# Patient Record
Sex: Male | Born: 1975
Health system: Southern US, Community
[De-identification: ages and names within clinical notes are randomized; demographics above are authoritative.]

## PROBLEM LIST (undated history)

## (undated) DIAGNOSIS — I130 Hypertensive heart and chronic kidney disease with heart failure and stage 1 through stage 4 chronic kidney disease, or unspecified chronic kidney disease: Secondary | ICD-10-CM

## (undated) DIAGNOSIS — D649 Anemia, unspecified: Secondary | ICD-10-CM

## (undated) DIAGNOSIS — E669 Obesity, unspecified: Secondary | ICD-10-CM

## (undated) DIAGNOSIS — I429 Cardiomyopathy, unspecified: Secondary | ICD-10-CM

## (undated) DIAGNOSIS — I110 Hypertensive heart disease with heart failure: Secondary | ICD-10-CM

## (undated) DIAGNOSIS — N186 End stage renal disease: Secondary | ICD-10-CM

## (undated) DIAGNOSIS — I5041 Acute combined systolic (congestive) and diastolic (congestive) heart failure: Secondary | ICD-10-CM

## (undated) DIAGNOSIS — Z992 Dependence on renal dialysis: Secondary | ICD-10-CM

## (undated) DIAGNOSIS — I10 Essential (primary) hypertension: Secondary | ICD-10-CM

## (undated) DIAGNOSIS — R0600 Dyspnea, unspecified: Secondary | ICD-10-CM

## (undated) DIAGNOSIS — N189 Chronic kidney disease, unspecified: Secondary | ICD-10-CM

## (undated) DIAGNOSIS — I509 Heart failure, unspecified: Secondary | ICD-10-CM

## (undated) HISTORY — PX: NO PAST SURGERIES: SHX2092

## (undated) HISTORY — PX: COLONOSCOPY: SHX174

## (undated) HISTORY — DX: Hypertensive heart disease with heart failure: I11.0

---

## 2013-09-20 ENCOUNTER — Emergency Department (HOSPITAL_COMMUNITY)
Admission: EM | Admit: 2013-09-20 | Discharge: 2013-09-21 | Disposition: A | Discharge status: Home / Self Care | Payer: Worker's Compensation | Attending: Emergency Medicine | Admitting: Emergency Medicine

## 2013-09-20 ENCOUNTER — Emergency Department (HOSPITAL_COMMUNITY): Payer: Worker's Compensation

## 2013-09-20 ENCOUNTER — Encounter (HOSPITAL_COMMUNITY): Payer: Self-pay | Admitting: Emergency Medicine

## 2013-09-20 DIAGNOSIS — S61209A Unspecified open wound of unspecified finger without damage to nail, initial encounter: Secondary | ICD-10-CM | POA: Insufficient documentation

## 2013-09-20 DIAGNOSIS — Z23 Encounter for immunization: Secondary | ICD-10-CM | POA: Insufficient documentation

## 2013-09-20 DIAGNOSIS — Z79899 Other long term (current) drug therapy: Secondary | ICD-10-CM | POA: Insufficient documentation

## 2013-09-20 DIAGNOSIS — F17200 Nicotine dependence, unspecified, uncomplicated: Secondary | ICD-10-CM | POA: Insufficient documentation

## 2013-09-20 DIAGNOSIS — S61012A Laceration without foreign body of left thumb without damage to nail, initial encounter: Secondary | ICD-10-CM

## 2013-09-20 DIAGNOSIS — Y9389 Activity, other specified: Secondary | ICD-10-CM | POA: Insufficient documentation

## 2013-09-20 DIAGNOSIS — W268XXA Contact with other sharp object(s), not elsewhere classified, initial encounter: Secondary | ICD-10-CM | POA: Insufficient documentation

## 2013-09-20 DIAGNOSIS — Y929 Unspecified place or not applicable: Secondary | ICD-10-CM | POA: Insufficient documentation

## 2013-09-20 MED ORDER — TETANUS-DIPHTH-ACELL PERTUSSIS 5-2.5-18.5 LF-MCG/0.5 IM SUSP
0.5000 mL | Freq: Once | INTRAMUSCULAR | Status: AC
  Administered 2013-09-20: 0.5 mL via INTRAMUSCULAR
  Filled 2013-09-20: qty 0.5

## 2013-09-20 MED ORDER — HYDROCODONE-ACETAMINOPHEN 5-325 MG PO TABS
1.0000 | ORAL_TABLET | Freq: Once | ORAL | Status: AC
  Administered 2013-09-20: 1 via ORAL
  Filled 2013-09-20: qty 1

## 2013-09-20 NOTE — ED Notes (Signed)
PA at bedside.

## 2013-09-20 NOTE — ED Notes (Signed)
Pt states he was at work and was washing dishes and a glass broke cutting his left thumb  Bleeding controlled

## 2013-09-20 NOTE — ED Provider Notes (Signed)
CSN: [id number]     Arrival date & time 09/20/13  2123 History  This chart was scribed for non-physician practitioner, Dewaine Oats, PA-C, working with Arbie Cookey, *, by Delphia Grates, ED Scribe. This patient was seen in room WTR5/WTR5 and the patient's care was started at 11:15 PM.    Chief Complaint  Patient presents with  . Extremity Laceration     The history is provided by the patient. No language interpreter was used.    HPI Comments: Nathan Campbell is a 38 y.o. male who presents to the Emergency Department complaining of laceration to left thumb that occurred PTA. Patient states he was at work washing dishes when a glass shattered, cutting his left thumb. There is associated mild pain with controlled bleeding. Patient has no history of significant health conditions. Patient is not UTD on tetanus.  History reviewed. No pertinent past medical history. History reviewed. No pertinent past surgical history. Family History  Problem Relation Age of Onset  . Diabetes Father    History  Substance Use Topics  . Smoking status: Current Some Day Smoker  . Smokeless tobacco: Not on file  . Alcohol Use: Yes     Comment: socially    Review of Systems  Skin: Positive for wound (left thumb).  All other systems reviewed and are negative.     Allergies  Review of patient's allergies indicates no known allergies.  Home Medications   Prior to Admission medications   Medication Sig Start Date End Date Taking? Authorizing Provider  acetaminophen (TYLENOL) 325 MG tablet Take 650 mg by mouth every 6 (six) hours as needed (pain).   Yes Historical Provider, MD   Triage Vitals: BP 166/111  Pulse 79  Temp(Src) 98.4 F (36.9 C) (Oral)  Resp 20  SpO2 98%  Physical Exam  Nursing note and vitals reviewed. Constitutional: He is oriented to person, place, and time. He appears well-developed and well-nourished. No distress.  HENT:  Head: Normocephalic and atraumatic.   Eyes: Conjunctivae and EOM are normal.  Neck: Neck supple.  Cardiovascular: Normal rate.   Pulmonary/Chest: Effort normal.  Musculoskeletal: Normal range of motion.  Neurological: He is alert and oriented to person, place, and time.  Skin: Skin is warm and dry.  Left thumb: 1 cm laceration over dorsal 1st MCP joint. FROM of the hand and all digits.  Psychiatric: He has a normal mood and affect. His behavior is normal.    ED Course  Procedures (including critical care time)  DIAGNOSTIC STUDIES: Oxygen Saturation is 98% on room air, normal by my interpretation.    COORDINATION OF CARE: At 2317 Discussed treatment plan with patient which includes imaging and laceration repair. Patient agrees.   Labs Review Labs Reviewed - No data to display  Imaging Review No results found.   EKG Interpretation None     Dg Finger Thumb Left  09/21/2013   CLINICAL DATA:  Thumb laceration.  EXAM: LEFT THUMB 2+V  COMPARISON:  None.  FINDINGS: There is no evidence of fracture or dislocation. There is no evidence of arthropathy or other focal bone abnormality. Soft tissues are unremarkable  IMPRESSION: Negative.   Electronically Signed   By: Elon Alas   On: 09/21/2013 00:15   MDM   Final diagnoses:  None    1. Laceration left thumb LACERATION REPAIR Performed by: Charlann Lange A Authorized by: Charlann Lange A Consent: Verbal consent obtained. Risks and benefits: risks, benefits and alternatives were discussed Consent given by:  patient Patient identity confirmed: provided demographic data Prepped and Draped in normal sterile fashion Wound explored  Laceration Location: left thumb  Laceration Length: 1cm  No Foreign Bodies seen or palpated  Anesthesia: local infiltration  Local anesthetic: lidocaine 2% w/o epinephrine  Anesthetic total: 1 ml  Irrigation method: syringe Amount of cleaning: standard  Skin closure: 4-0 prolene  Number of sutures: 2  Technique:  simple interrupted  Patient tolerance: Patient tolerated the procedure well with no immediate complications.   I personally performed the services described in this documentation, which was scribed in my presence. The recorded information has been reviewed and is accurate.     Dewaine Oats, PA-C 09/21/13 [phone]

## 2013-09-21 MED ORDER — IBUPROFEN 800 MG PO TABS: 800.0000 mg | ORAL_TABLET | Freq: Three times a day (TID) | ORAL | Status: DC

## 2013-09-21 NOTE — Discharge Instructions (Signed)
Sutured Wound Care °Sutures are stitches that can be used to close wounds. Wound care helps prevent pain and infection.  °HOME CARE INSTRUCTIONS  °· Rest and elevate the injured area until all the pain and swelling are gone. °· Only take over-the-counter or prescription medicines for pain, discomfort, or fever as directed by your caregiver. °· After 48 hours, gently wash the area with mild soap and water once a day, or as directed. Rinse off the soap. Pat the area dry with a clean towel. Do not rub the wound. This may cause bleeding. °· Follow your caregiver's instructions for how often to change the bandage (dressing). Stop using a dressing after 2 days or after the wound stops draining. °· If the dressing sticks, moisten it with soapy water and gently remove it. °· Apply ointment on the wound as directed. °· Avoid stretching a sutured wound. °· Drink enough fluids to keep your urine clear or pale yellow. °· Follow up with your caregiver for suture removal as directed. °· Use sunscreen on your wound for the next 3 to 6 months so the scar will not darken. °SEEK IMMEDIATE MEDICAL CARE IF:  °· Your wound becomes red, swollen, hot, or tender. °· You have increasing pain in the wound. °· You have a red streak that extends from the wound. °· There is pus coming from the wound. °· You have a fever. °· You have shaking chills. °· There is a bad smell coming from the wound. °· You have persistent bleeding from the wound. °MAKE SURE YOU:  °· Understand these instructions. °· Will watch your condition. °· Will get help right away if you are not doing well or get worse. °Document Released: 04/08/2004 Document Revised: 05/24/2011 Document Reviewed: 07/05/2010 °Exit[employer]® Patient Information ©2015 [employer]. This information is not intended to replace advice given to you by your health care provider. Make sure you discuss any questions you have with your health care provider. ° °

## 2013-09-21 NOTE — ED Notes (Signed)
PA at bedside.  Pt being sutured

## 2013-09-23 NOTE — ED Provider Notes (Signed)
Medical screening examination/treatment/procedure(s) were performed by non-physician practitioner and as supervising physician I was immediately available for consultation/collaboration.   Arbie Cookey, MD 09/23/13 2044

## 2016-06-10 ENCOUNTER — Ambulatory Visit (HOSPITAL_COMMUNITY)
Admission: EM | Admit: 2016-06-10 | Discharge: 2016-06-10 | Disposition: A | Discharge status: Home / Self Care | Payer: BLUE CROSS/BLUE SHIELD | Source: Home / Self Care

## 2016-06-10 ENCOUNTER — Inpatient Hospital Stay (HOSPITAL_COMMUNITY)
Admission: EM | Admit: 2016-06-10 | Discharge: 2016-06-15 | DRG: 291 | Disposition: A | Discharge status: Home / Self Care | Payer: BLUE CROSS/BLUE SHIELD | Attending: Internal Medicine | Admitting: Internal Medicine

## 2016-06-10 ENCOUNTER — Inpatient Hospital Stay (HOSPITAL_COMMUNITY): Payer: BLUE CROSS/BLUE SHIELD

## 2016-06-10 ENCOUNTER — Encounter (HOSPITAL_COMMUNITY): Payer: Self-pay | Admitting: Family Medicine

## 2016-06-10 ENCOUNTER — Emergency Department (HOSPITAL_COMMUNITY): Payer: BLUE CROSS/BLUE SHIELD

## 2016-06-10 ENCOUNTER — Encounter (HOSPITAL_COMMUNITY): Payer: Self-pay

## 2016-06-10 DIAGNOSIS — N2581 Secondary hyperparathyroidism of renal origin: Secondary | ICD-10-CM | POA: Diagnosis not present

## 2016-06-10 DIAGNOSIS — E669 Obesity, unspecified: Secondary | ICD-10-CM | POA: Diagnosis not present

## 2016-06-10 DIAGNOSIS — N289 Disorder of kidney and ureter, unspecified: Secondary | ICD-10-CM | POA: Diagnosis not present

## 2016-06-10 DIAGNOSIS — Z6837 Body mass index (BMI) 37.0-37.9, adult: Secondary | ICD-10-CM | POA: Diagnosis not present

## 2016-06-10 DIAGNOSIS — R809 Proteinuria, unspecified: Secondary | ICD-10-CM | POA: Diagnosis not present

## 2016-06-10 DIAGNOSIS — I5040 Unspecified combined systolic (congestive) and diastolic (congestive) heart failure: Secondary | ICD-10-CM | POA: Diagnosis present

## 2016-06-10 DIAGNOSIS — I5041 Acute combined systolic (congestive) and diastolic (congestive) heart failure: Secondary | ICD-10-CM

## 2016-06-10 DIAGNOSIS — D649 Anemia, unspecified: Secondary | ICD-10-CM | POA: Diagnosis not present

## 2016-06-10 DIAGNOSIS — K5900 Constipation, unspecified: Secondary | ICD-10-CM | POA: Diagnosis not present

## 2016-06-10 DIAGNOSIS — I132 Hypertensive heart and chronic kidney disease with heart failure and with stage 5 chronic kidney disease, or end stage renal disease: Secondary | ICD-10-CM | POA: Diagnosis not present

## 2016-06-10 DIAGNOSIS — I509 Heart failure, unspecified: Secondary | ICD-10-CM | POA: Diagnosis not present

## 2016-06-10 DIAGNOSIS — R748 Abnormal levels of other serum enzymes: Secondary | ICD-10-CM | POA: Diagnosis not present

## 2016-06-10 DIAGNOSIS — N185 Chronic kidney disease, stage 5: Secondary | ICD-10-CM | POA: Diagnosis not present

## 2016-06-10 DIAGNOSIS — I429 Cardiomyopathy, unspecified: Secondary | ICD-10-CM | POA: Diagnosis not present

## 2016-06-10 DIAGNOSIS — I420 Dilated cardiomyopathy: Secondary | ICD-10-CM | POA: Diagnosis not present

## 2016-06-10 DIAGNOSIS — I428 Other cardiomyopathies: Secondary | ICD-10-CM

## 2016-06-10 DIAGNOSIS — R0602 Shortness of breath: Secondary | ICD-10-CM | POA: Diagnosis not present

## 2016-06-10 DIAGNOSIS — R740 Nonspecific elevation of levels of transaminase and lactic acid dehydrogenase [LDH]: Secondary | ICD-10-CM

## 2016-06-10 DIAGNOSIS — R7989 Other specified abnormal findings of blood chemistry: Secondary | ICD-10-CM | POA: Diagnosis not present

## 2016-06-10 DIAGNOSIS — R778 Other specified abnormalities of plasma proteins: Secondary | ICD-10-CM

## 2016-06-10 DIAGNOSIS — I160 Hypertensive urgency: Secondary | ICD-10-CM | POA: Diagnosis present

## 2016-06-10 DIAGNOSIS — R000 Tachycardia, unspecified: Secondary | ICD-10-CM | POA: Diagnosis not present

## 2016-06-10 DIAGNOSIS — I10 Essential (primary) hypertension: Secondary | ICD-10-CM | POA: Diagnosis not present

## 2016-06-10 DIAGNOSIS — N179 Acute kidney failure, unspecified: Secondary | ICD-10-CM | POA: Diagnosis not present

## 2016-06-10 DIAGNOSIS — Z833 Family history of diabetes mellitus: Secondary | ICD-10-CM | POA: Diagnosis not present

## 2016-06-10 DIAGNOSIS — N184 Chronic kidney disease, stage 4 (severe): Secondary | ICD-10-CM

## 2016-06-10 DIAGNOSIS — F17210 Nicotine dependence, cigarettes, uncomplicated: Secondary | ICD-10-CM | POA: Diagnosis present

## 2016-06-10 DIAGNOSIS — N19 Unspecified kidney failure: Secondary | ICD-10-CM | POA: Diagnosis not present

## 2016-06-10 DIAGNOSIS — I5020 Unspecified systolic (congestive) heart failure: Secondary | ICD-10-CM | POA: Diagnosis not present

## 2016-06-10 DIAGNOSIS — I110 Hypertensive heart disease with heart failure: Secondary | ICD-10-CM | POA: Diagnosis not present

## 2016-06-10 DIAGNOSIS — R0600 Dyspnea, unspecified: Secondary | ICD-10-CM | POA: Diagnosis not present

## 2016-06-10 DIAGNOSIS — K761 Chronic passive congestion of liver: Secondary | ICD-10-CM | POA: Diagnosis not present

## 2016-06-10 DIAGNOSIS — I5031 Acute diastolic (congestive) heart failure: Secondary | ICD-10-CM | POA: Diagnosis not present

## 2016-06-10 DIAGNOSIS — I5021 Acute systolic (congestive) heart failure: Secondary | ICD-10-CM | POA: Diagnosis not present

## 2016-06-10 DIAGNOSIS — R7401 Elevation of levels of liver transaminase levels: Secondary | ICD-10-CM | POA: Diagnosis present

## 2016-06-10 HISTORY — DX: Essential (primary) hypertension: I10

## 2016-06-10 HISTORY — DX: Dyspnea, unspecified: R06.00

## 2016-06-10 HISTORY — DX: Acute combined systolic (congestive) and diastolic (congestive) heart failure: I50.41

## 2016-06-10 HISTORY — DX: Cardiomyopathy, unspecified: I42.9

## 2016-06-10 HISTORY — DX: Heart failure, unspecified: I50.9

## 2016-06-10 HISTORY — DX: Hypertensive heart and chronic kidney disease with heart failure and stage 1 through stage 4 chronic kidney disease, or unspecified chronic kidney disease: I13.0

## 2016-06-10 LAB — URINALYSIS, ROUTINE W REFLEX MICROSCOPIC
Bacteria, UA: NONE SEEN
Bilirubin Urine: NEGATIVE
GLUCOSE, UA: NEGATIVE mg/dL
KETONES UR: NEGATIVE mg/dL
LEUKOCYTES UA: NEGATIVE
Nitrite: NEGATIVE
PROTEIN: 100 mg/dL — AB
Specific Gravity, Urine: 1.006 (ref 1.005–1.030)
pH: 7 (ref 5.0–8.0)

## 2016-06-10 LAB — COMPREHENSIVE METABOLIC PANEL
ALT: 126 U/L — AB (ref 17–63)
AST: 42 U/L — ABNORMAL HIGH (ref 15–41)
Albumin: 3.5 g/dL (ref 3.5–5.0)
Alkaline Phosphatase: 115 U/L (ref 38–126)
Anion gap: 11 (ref 5–15)
BUN: 49 mg/dL — ABNORMAL HIGH (ref 6–20)
CHLORIDE: 103 mmol/L (ref 101–111)
CO2: 24 mmol/L (ref 22–32)
CREATININE: 5.18 mg/dL — AB (ref 0.61–1.24)
Calcium: 8.5 mg/dL — ABNORMAL LOW (ref 8.9–10.3)
GFR calc non Af Amer: 13 mL/min — ABNORMAL LOW (ref >60)
GFR, EST AFRICAN AMERICAN: 15 mL/min — AB (ref >60)
GLUCOSE: 117 mg/dL — AB (ref 65–99)
Potassium: 3.5 mmol/L (ref 3.5–5.1)
SODIUM: 138 mmol/L (ref 135–145)
Total Bilirubin: 1 mg/dL (ref 0.3–1.2)
Total Protein: 5.7 g/dL — ABNORMAL LOW (ref 6.5–8.1)

## 2016-06-10 LAB — RAPID URINE DRUG SCREEN, HOSP PERFORMED
Amphetamines: NOT DETECTED
BENZODIAZEPINES: NOT DETECTED
Barbiturates: NOT DETECTED
Cocaine: NOT DETECTED
OPIATES: NOT DETECTED
Tetrahydrocannabinol: NOT DETECTED

## 2016-06-10 LAB — TROPONIN I: TROPONIN I: 0.23 ng/mL — AB (ref <0.03)

## 2016-06-10 LAB — CBC
HCT: 35.7 % — ABNORMAL LOW (ref 39.0–52.0)
Hemoglobin: 11.8 g/dL — ABNORMAL LOW (ref 13.0–17.0)
MCH: 30.2 pg (ref 26.0–34.0)
MCHC: 33.1 g/dL (ref 30.0–36.0)
MCV: 91.3 fL (ref 78.0–100.0)
PLATELETS: 170 10*3/uL (ref 150–400)
RBC: 3.91 MIL/uL — AB (ref 4.22–5.81)
RDW: 14.7 % (ref 11.5–15.5)
WBC: 7.3 10*3/uL (ref 4.0–10.5)

## 2016-06-10 LAB — SODIUM, URINE, RANDOM: SODIUM UR: 109 mmol/L

## 2016-06-10 LAB — CREATININE, URINE, RANDOM: CREATININE, URINE: 43.37 mg/dL

## 2016-06-10 LAB — BRAIN NATRIURETIC PEPTIDE: B Natriuretic Peptide: 1211.1 pg/mL — ABNORMAL HIGH (ref 0.0–100.0)

## 2016-06-10 MED ORDER — SODIUM CHLORIDE 0.9% FLUSH: 3.0000 mL | INTRAVENOUS | Status: DC | PRN

## 2016-06-10 MED ORDER — NITROGLYCERIN IN D5W 200-5 MCG/ML-% IV SOLN
0.0000 ug/min | INTRAVENOUS | Status: DC
  Administered 2016-06-10: 20 ug/min via INTRAVENOUS
  Filled 2016-06-10: qty 250

## 2016-06-10 MED ORDER — FUROSEMIDE 10 MG/ML IJ SOLN
80.0000 mg | Freq: Once | INTRAMUSCULAR | Status: AC
  Administered 2016-06-10: 80 mg via INTRAVENOUS
  Filled 2016-06-10: qty 8

## 2016-06-10 MED ORDER — ONDANSETRON HCL 4 MG/2ML IJ SOLN
4.0000 mg | Freq: Four times a day (QID) | INTRAMUSCULAR | Status: DC | PRN
  Administered 2016-06-11: 4 mg via INTRAVENOUS
  Filled 2016-06-10: qty 2

## 2016-06-10 MED ORDER — ALPRAZOLAM 0.25 MG PO TABS
0.2500 mg | ORAL_TABLET | Freq: Two times a day (BID) | ORAL | Status: DC | PRN
  Administered 2016-06-10 – 2016-06-13 (×4): 0.25 mg via ORAL
  Filled 2016-06-10 (×4): qty 1

## 2016-06-10 MED ORDER — NITROGLYCERIN IN D5W 200-5 MCG/ML-% IV SOLN: 5.0000 ug/min | INTRAVENOUS | Status: DC

## 2016-06-10 MED ORDER — HYDRALAZINE HCL 20 MG/ML IJ SOLN
20.0000 mg | Freq: Once | INTRAMUSCULAR | Status: AC
  Administered 2016-06-10: 20 mg via INTRAVENOUS
  Filled 2016-06-10: qty 1

## 2016-06-10 MED ORDER — HEPARIN SODIUM (PORCINE) 5000 UNIT/ML IJ SOLN
5000.0000 [IU] | Freq: Three times a day (TID) | INTRAMUSCULAR | Status: DC
  Administered 2016-06-10 – 2016-06-15 (×15): 5000 [IU] via SUBCUTANEOUS
  Filled 2016-06-10 (×15): qty 1

## 2016-06-10 MED ORDER — FUROSEMIDE 10 MG/ML IJ SOLN
40.0000 mg | Freq: Two times a day (BID) | INTRAMUSCULAR | Status: DC
  Administered 2016-06-11 – 2016-06-14 (×7): 40 mg via INTRAVENOUS
  Filled 2016-06-10 (×7): qty 4

## 2016-06-10 MED ORDER — SODIUM CHLORIDE 0.9% FLUSH
3.0000 mL | Freq: Two times a day (BID) | INTRAVENOUS | Status: DC
  Administered 2016-06-10 – 2016-06-14 (×9): 3 mL via INTRAVENOUS

## 2016-06-10 MED ORDER — NITROGLYCERIN IN D5W 200-5 MCG/ML-% IV SOLN
5.0000 ug/min | INTRAVENOUS | Status: DC
  Administered 2016-06-10: 5 ug/min via INTRAVENOUS
  Filled 2016-06-10: qty 250

## 2016-06-10 MED ORDER — HYDRALAZINE HCL 20 MG/ML IJ SOLN
10.0000 mg | INTRAMUSCULAR | Status: DC | PRN
  Administered 2016-06-10 – 2016-06-14 (×5): 10 mg via INTRAVENOUS
  Filled 2016-06-10 (×5): qty 1

## 2016-06-10 MED ORDER — MAGNESIUM SULFATE 2 GM/50ML IV SOLN
2.0000 g | Freq: Once | INTRAVENOUS | Status: AC
  Administered 2016-06-10: 2 g via INTRAVENOUS
  Filled 2016-06-10: qty 50

## 2016-06-10 MED ORDER — POTASSIUM CHLORIDE CRYS ER 20 MEQ PO TBCR
20.0000 meq | EXTENDED_RELEASE_TABLET | Freq: Once | ORAL | Status: AC
  Administered 2016-06-10: 20 meq via ORAL
  Filled 2016-06-10: qty 1

## 2016-06-10 MED ORDER — ACETAMINOPHEN 325 MG PO TABS
650.0000 mg | ORAL_TABLET | ORAL | Status: DC | PRN
  Administered 2016-06-11 – 2016-06-14 (×8): 650 mg via ORAL
  Filled 2016-06-10 (×8): qty 2

## 2016-06-10 MED ORDER — ZOLPIDEM TARTRATE 5 MG PO TABS: 5.0000 mg | ORAL_TABLET | Freq: Every evening | ORAL | Status: DC | PRN

## 2016-06-10 MED ORDER — SODIUM CHLORIDE 0.9 % IV SOLN: 250.0000 mL | INTRAVENOUS | Status: DC | PRN

## 2016-06-10 NOTE — ED Notes (Signed)
CRITICAL VALUE ALERT  Critical value received:  TROPONIN 0.23  Date of notification:  06/10/2016  Time of notification: 3559  Nurse who received alert:  Kristopher Glee RN  MD notified: Pattricia Boss MD

## 2016-06-10 NOTE — H&P (Signed)
History and Physical    Levy Cedano ZOX:[id number] DOB: 09/17/1975 DOA: 06/10/2016  PCP: No PCP Per Patient   Patient coming from: Home, by way of urgent care   Chief Complaint: SOB, ankle swelling  HPI: Nathan Campbell is a 41 y.o. male with medical history significant for polysubstance abuse in early remission, and has been told he had high blood pressure, though with no PCP for many years and no medications, now presenting from urgent care where he was seen for progressive dyspnea, orthopnea, and bilateral lower extremity edema.  Patient reports that he is in his usual state until approximately 2 weeks ago when he noted the insidious development of exertional dyspnea and orthopnea. He also was noted to have ankle swelling develop around that same time. Over the ensuing 2 weeks, the exertional dyspnea, swelling, and orthopnea all worsened. There is been no fevers or chills over this interval and no significant chest pain or palpitations. Patient endorses a history of cocaine abuse, reporting his last used 2 of been more than a month ago. He also reports that he quit smoking 2 weeks ago with the onset of this current symptoms and has not been drinking any alcohol and several weeks. He was noted to be hypertensive to 203/136 and mildly tachycardic at the urgent care and was directed to the ED for further evaluation.  ED Course: Upon arrival to the ED, patient is found to be hypertensive to 234/164, tachycardic in the low 100s, and with vitals otherwise stable. EKG features a normal sinus rhythm with QTc of 513 ms. Chest x-ray is negative for acute cardiopulmonary disease. Chemistry panel features a serum creatinine of 5.18 with no prior for comparison, and AST of 42, and ALT of 126. CBC is notable for a mild normocytic anemia with hemoglobin of 11.8 and no priors available. Troponin is elevated to 0.23 and BNP is elevated at 1211. Patient was treated with 80 mg IV push of Lasix in the emergency  department, 20 mg IV hydralazine, and then started on nitroglycerin infusion. He was also given 20 mEq of oral potassium. Patient remained markedly hypertensive in the ED, but free of any anginal complaints, and will be admitted to the stepdown unit for ongoing evaluation and management of dyspnea and edema suspected secondary to acute CHF, complicated by kidney disease of unknown chronicity.  Review of Systems:  All other systems reviewed and apart from HPI, are negative.  Past Medical History:  Diagnosis Date  . Hypertension     History reviewed. No pertinent surgical history.   reports that he has been smoking.  He has never used smokeless tobacco. He reports that he drinks alcohol. He reports that he does not use drugs.  No Known Allergies  Family History  Problem Relation Age of Onset  . Diabetes Father      Prior to Admission medications   Not on File    Physical Exam: Vitals:   06/10/16 1752 06/10/16 1800 06/10/16 1815 06/10/16 1915  BP:  (!) 216/149 (!) 215/136 (!) 192/117  Pulse: (!) 114 (!) 109 (!) 105 (!) 108  Resp: (!) 43 (!) 33 18 (!) 32  Temp:      TempSrc:      SpO2: 93% 97% 97% 95%  Height:          Constitutional: NAD, calm, comfortable Eyes: PERTLA, lids and conjunctivae normal ENMT: Mucous membranes are moist. Posterior pharynx clear of any exudate or lesions.   Neck: normal, supple, no masses, no  thyromegaly Respiratory: clear to auscultation bilaterally. Mild tachypnea, no accessory muscle use. No pallor.  Cardiovascular: Rate ~110 and regular with S3 gallop, no appreciable murmur. 2+ pretibial edema bilaterally. JVP not well-visualized.  Abdomen: No distension, no tenderness, no masses palpated. Bowel sounds normal.  Musculoskeletal: no clubbing / cyanosis. No joint deformity upper and lower extremities.    Skin: no significant rashes, lesions, ulcers. Warm, dry, well-perfused. Neurologic: CN 2-12 grossly intact. Sensation intact, DTR normal.  Strength 5/5 in all 4 limbs.  Psychiatric: Normal judgment and insight. Alert and oriented x 3. Normal mood and affect.     Labs on Admission: I have personally reviewed following labs and imaging studies  CBC:  Recent Labs Lab 06/10/16 1626  WBC 7.3  HGB 11.8*  HCT 35.7*  MCV 91.3  PLT 588   Basic Metabolic Panel:  Recent Labs Lab 06/10/16 1626  NA 138  K 3.5  CL 103  CO2 24  GLUCOSE 117*  BUN 49*  CREATININE 5.18*  CALCIUM 8.5*   GFR: CrCl cannot be calculated (Unknown ideal weight.). Liver Function Tests:  Recent Labs Lab 06/10/16 1626  AST 42*  ALT 126*  ALKPHOS 115  BILITOT 1.0  PROT 5.7*  ALBUMIN 3.5   No results for input(s): LIPASE, AMYLASE in the last 168 hours. No results for input(s): AMMONIA in the last 168 hours. Coagulation Profile: No results for input(s): INR, PROTIME in the last 168 hours. Cardiac Enzymes:  Recent Labs Lab 06/10/16 1626  TROPONINI 0.23*   BNP (last 3 results) No results for input(s): PROBNP in the last 8760 hours. HbA1C: No results for input(s): HGBA1C in the last 72 hours. CBG: No results for input(s): GLUCAP in the last 168 hours. Lipid Profile: No results for input(s): CHOL, HDL, LDLCALC, TRIG, CHOLHDL, LDLDIRECT in the last 72 hours. Thyroid Function Tests: No results for input(s): TSH, T4TOTAL, FREET4, T3FREE, THYROIDAB in the last 72 hours. Anemia Panel: No results for input(s): VITAMINB12, FOLATE, FERRITIN, TIBC, IRON, RETICCTPCT in the last 72 hours. Urine analysis:    Component Value Date/Time   COLORURINE STRAW (A) 06/10/2016 1743   APPEARANCEUR CLEAR 06/10/2016 1743   LABSPEC 1.006 06/10/2016 1743   PHURINE 7.0 06/10/2016 1743   GLUCOSEU NEGATIVE 06/10/2016 1743   HGBUR MODERATE (A) 06/10/2016 1743   BILIRUBINUR NEGATIVE 06/10/2016 1743   KETONESUR NEGATIVE 06/10/2016 1743   PROTEINUR 100 (A) 06/10/2016 1743   NITRITE NEGATIVE 06/10/2016 1743   LEUKOCYTESUR NEGATIVE 06/10/2016 1743    Sepsis Labs: @LABRCNTIP (procalcitonin:4,lacticidven:4) )No results found for this or any previous visit (from the past 240 hour(s)).   Radiological Exams on Admission: Dg Chest 2 View  Result Date: 06/10/2016 CLINICAL DATA:  Shortness of Breath EXAM: CHEST  2 VIEW COMPARISON:  None. FINDINGS: Cardiac shadow is enlarged. The lungs are well aerated bilaterally. No focal infiltrate or sizable effusion is seen. No bony abnormality is noted. IMPRESSION: No acute abnormality noted. Electronically Signed   By: Inez Catalina M.D.   On: 06/10/2016 11:50   US Renal  Result Date: 06/10/2016 CLINICAL DATA:  Acute renal failure, history hypertension, smoking EXAM: RENAL / URINARY TRACT ULTRASOUND COMPLETE COMPARISON:  None FINDINGS: Right Kidney: Length: 11.6 cm. Normal cortical thickness. Mildly increased cortical echogenicity. No mass, hydronephrosis or shadowing calcification. Left Kidney: Length: 11.6 cm. Normal cortical thickness. Mildly increased cortical echogenicity. No mass, hydronephrosis or shadowing calcification. Bladder: Normal appearance IMPRESSION: Medical renal disease changes of both kidneys without evidence of renal mass or hydronephrosis. Electronically Signed  By: Lavonia Dana M.D.   On: 06/10/2016 19:27    EKG: Independently reviewed. NSR, QTc 513 ms  Assessment/Plan  1. Acute CHF  - Pt presents with progressive SOB, b/l ankle swelling, and orthopnea; found to be markedly hypertensive, mildly tachycardic, and with elevated BNP and S3 gallop  - Suspect this is secondary to CHF, likely from uncontrolled HTN or cocaine abuse; troponin elevated, but no anginal complaint to suggest acute ischemic etiology  - He was given 80 mg IV Lasix in ED and started on nitroglycerin infusion  - Continue diuresis with Lasix 40 mg IV q12h, adjusted as needed  - Follow daily wts and strict I/O's, SLIV, fluid-restrict diet - No ACE/ARB secondary to renal insufficiency; consider adding beta-blocker once  better compensated  - Echo is ordered    2. Hypertensive urgency  - BP elevated to 220/150 range initially in setting of acute CHF  - Improved with 20 mg IVP hydralazine, 80 mg IVP Lasix, and NTG infusion in ED  - Continue to diurese, continue to titrate nitro infusion   - Consider adding beta-blocker once better compensated    3. Kidney disease of uncertain chronicity  - SCr is 5.18 on admission with no prior available  - Renal US performed with bilateral medical renal disease, no mass or hydro  - Check urine studies  - Follow daily chem panel during diuresis    4. Elevated troponin  - Troponin elevated to 0.23 on admission with no chest pain  - Likely represents demand ischemia in setting of acute CHF with hypertensive urgency  - Plan to monitor on telemetry for ischemic changes, trend troponin, repeat EKG, continue nitro    5. Elevated transaminases  - AST 42 and ALT 126 on admission with normal bilirubin and no abdominal pain  - Likely secondary to CHF and congestive hepatopathy   - May need further workup if fails to resolves with treatment of CHF    DVT prophylaxis: sq heparin  Code Status: Full  Family Communication: Discussed with patient Disposition Plan: Admit to telemetry Consults called: None Admission status: Inpatient    Vianne Bulls, MD [facility] Pager [phone]  If 7PM-7AM, please contact night-coverage [email or web address] Password [facility]  06/10/2016, 7:44 PM

## 2016-06-10 NOTE — ED Provider Notes (Signed)
[place] DEPT Provider Note   CSN: [id number] Arrival date & time: 06/10/16  1052     History   Chief Complaint Chief Complaint  Patient presents with  . Shortness of Breath    HPI Nathan Campbell is a 41 y.o. male.  HPI 41 year old man sent here for urgent care with complaints of shortness of breath. He has had increasing shortness of breath over the past several weeks. It has become severe in the past 2 days. He notices it worsening with exertion. He works in [employer]. He has had increased peripheral edema. He was a smoker but quit several weeks ago. He has not had fever or productive cough. He denies any chest pain although he does feel like he is full in his chest. He states he was told in the past that he had high blood pressure but has not sought treatment and not been taking any medications for this. He denies any other past medical history. Past Medical History:  Diagnosis Date  . Hypertension     There are no active problems to display for this patient.   No past surgical history on file.     Home Medications    Prior to Admission medications   Not on File    Family History Family History  Problem Relation Age of Onset  . Diabetes Father     Social History Social History  Substance Use Topics  . Smoking status: Current Some Day Smoker  . Smokeless tobacco: Never Used  . Alcohol use Yes     Comment: socially     Allergies   Patient has no known allergies.   Review of Systems Review of Systems  Constitutional: Positive for activity change. Negative for fever.  HENT: Negative.   Eyes: Negative.   Respiratory: Positive for shortness of breath. Negative for wheezing.   Cardiovascular: Positive for leg swelling. Negative for chest pain.  Gastrointestinal: Negative.   Endocrine: Negative.   Genitourinary: Negative.   Musculoskeletal: Negative.   Allergic/Immunologic: Negative.   Neurological: Negative.   Hematological: Negative.     Psychiatric/Behavioral: Negative.   All other systems reviewed and are negative.    Physical Exam Updated Vital Signs BP (!) 218/154   Pulse (!) 114   Temp 98.1 F (36.7 C) (Oral)   Resp (!) 43   Ht 6' (1.829 m)   SpO2 93%   Physical Exam  Constitutional: He is oriented to person, place, and time. He appears well-developed and well-nourished.  Patient with blood pressure 440 systolically. Review of prior visits show elevated blood pressure on past visit of 10/01/2014 was 166  HENT:  Head: Normocephalic and atraumatic.  Right Ear: External ear normal.  Left Ear: External ear normal.  Nose: Nose normal.  Mouth/Throat: Oropharynx is clear and moist.  Eyes: Conjunctivae and EOM are normal. Pupils are equal, round, and reactive to light.  Neck: Normal range of motion. Neck supple.  Cardiovascular: Normal rate, regular rhythm, normal heart sounds and intact distal pulses.   Pulmonary/Chest: Effort normal and breath sounds normal.  Abdominal: Soft. Bowel sounds are normal.  Musculoskeletal: Normal range of motion. He exhibits edema.  Neurological: He is alert and oriented to person, place, and time.  Skin: Skin is warm and dry. Capillary refill takes less than 2 seconds.  Psychiatric: He has a normal mood and affect. His behavior is normal. Judgment and thought content normal.  Nursing note and vitals reviewed.    ED Treatments / Results  Labs (all  labs ordered are listed, but only abnormal results are displayed) Labs Reviewed  CBC - Abnormal; Notable for the following:       Result Value   RBC 3.91 (*)    Hemoglobin 11.8 (*)    HCT 35.7 (*)    All other components within normal limits  COMPREHENSIVE METABOLIC PANEL - Abnormal; Notable for the following:    Glucose, Bld 117 (*)    BUN 49 (*)    Creatinine, Ser 5.18 (*)    Calcium 8.5 (*)    Total Protein 5.7 (*)    AST 42 (*)    ALT 126 (*)    GFR calc non Af Amer 13 (*)    GFR calc Af Amer 15 (*)    All other  components within normal limits  BRAIN NATRIURETIC PEPTIDE - Abnormal; Notable for the following:    B Natriuretic Peptide 1,211.1 (*)    All other components within normal limits  TROPONIN I - Abnormal; Notable for the following:    Troponin I 0.23 (*)    All other components within normal limits  URINALYSIS, ROUTINE W REFLEX MICROSCOPIC    EKG  EKG Interpretation  Date/Time:  Thursday June 10 2016 11:14:23 EDT Ventricular Rate:  99 PR Interval:  170 QRS Duration: 82 QT Interval:  400 QTC Calculation: 513 R Axis:   -5 Text Interpretation:  Normal sinus rhythm Biatrial enlargement Prolonged QT Abnormal ECG Confirmed by Booker Bhatnagar MD, Andee Poles [phone]) on 06/10/2016 4:17:31 PM       Radiology Dg Chest 2 View  Result Date: 06/10/2016 CLINICAL DATA:  Shortness of Breath EXAM: CHEST  2 VIEW COMPARISON:  None. FINDINGS: Cardiac shadow is enlarged. The lungs are well aerated bilaterally. No focal infiltrate or sizable effusion is seen. No bony abnormality is noted. IMPRESSION: No acute abnormality noted. Electronically Signed   By: Inez Catalina M.D.   On: 06/10/2016 11:50    Procedures Procedures (including critical care time)  Medications Ordered in ED Medications  nitroGLYCERIN 50 mg in dextrose 5 % 250 mL (0.2 mg/mL) infusion (5 mcg/min Intravenous New Bag/Given 06/10/16 1642)  furosemide (LASIX) injection 80 mg (80 mg Intravenous Given 06/10/16 1641)  potassium chloride SA (K-DUR,KLOR-CON) CR tablet 20 mEq (20 mEq Oral Given 06/10/16 1642)  hydrALAZINE (APRESOLINE) injection 20 mg (20 mg Intravenous Given 06/10/16 1641)     Initial Impression / Assessment and Plan / ED Course  I have reviewed the triage vital signs and the nursing notes.  Pertinent labs & imaging results that were available during my care of the patient were reviewed by me and considered in my medical decision making (see chart for details).  6:27 PM Nitro drip continuing to be titrated. Patient somewhat improved.  He is currently voiding after receiving iv lasix. Systolic blood pressure continues elevated at 2:15. Discussed patient's care with Dr. Myna Hidalgo hospitalists and he will place admission orders      Final Clinical Impressions(s) / ED Diagnoses   Final diagnoses:  Congestive heart failure, unspecified congestive heart failure chronicity, unspecified congestive heart failure type ([place])  Renal failure, unspecified chronicity  Elevated troponin  Hypertensive urgency    New Prescriptions New Prescriptions   No medications on file     Pattricia Boss, MD 06/10/16 8182

## 2016-06-10 NOTE — ED Notes (Signed)
Pt. Episode of dry heaving. EDP made aware.

## 2016-06-10 NOTE — ED Notes (Signed)
Pt. Titrated to 63mcg nitro per EDP.

## 2016-06-10 NOTE — ED Provider Notes (Signed)
[place]    CSN: [id number] Arrival date & time: 06/10/16  1003     History   Chief Complaint Chief Complaint  Patient presents with  . Shortness of Breath    HPI Nathan Campbell is a 41 y.o. male.   This is a 41 year old man works in [employer]. Over the last 2 weeks he's developed significant shortness of breath, orthopnea, pedal edema, loss of appetite with some vomiting, and dyspnea on exertion. He's had some mild chest fullness but no frank chest pain. He is known that he's had high blood pressure for some time but has not done anything about it.  Patient is a former cocaine user. He hasn't had any cocaine use an over month. He is a former smoker having quit 2 weeks ago. He denies taking other medications or alcohol recently.      Past Medical History:  Diagnosis Date  . Hypertension     There are no active problems to display for this patient.   History reviewed. No pertinent surgical history.     Home Medications    Prior to Admission medications   Medication Sig Start Date End Date Taking? Authorizing Provider  acetaminophen (TYLENOL) 325 MG tablet Take 650 mg by mouth every 6 (six) hours as needed (pain).    Historical Provider, MD  ibuprofen (ADVIL,MOTRIN) 800 MG tablet Take 1 tablet (800 mg total) by mouth 3 (three) times daily. 09/21/13   Charlann Lange, PA-C    Family History Family History  Problem Relation Age of Onset  . Diabetes Father     Social History Social History  Substance Use Topics  . Smoking status: Current Some Day Smoker  . Smokeless tobacco: Never Used  . Alcohol use Yes     Comment: socially     Allergies   Patient has no known allergies.   Review of Systems Review of Systems  Constitutional: Positive for activity change, appetite change and fatigue. Negative for fever.  HENT: Negative.   Eyes: Negative.   Respiratory: Positive for chest tightness and shortness of breath.   Cardiovascular: Positive  for leg swelling.  Gastrointestinal: Positive for vomiting.  Musculoskeletal: Negative.   Neurological: Positive for weakness.     Physical Exam Triage Vital Signs ED Triage Vitals [06/10/16 1022]  Enc Vitals Group     BP (!) 203/136     Pulse Rate (!) 103     Resp 14     Temp 98.6 F (37 C)     Temp Source Oral     SpO2 99 %     Weight      Height      Head Circumference      Peak Flow      Pain Score      Pain Loc      Pain Edu?      Excl. in [place]?    No data found.   Updated Vital Signs BP (!) 203/136 (BP Location: Left Arm) Comment: notified rn  Pulse (!) 103 Comment: notified rn  Temp 98.6 F (37 C) (Oral)   Resp 14   SpO2 99%    Physical Exam  Constitutional: He is oriented to person, place, and time. He appears well-developed and well-nourished.  HENT:  Right Ear: External ear normal.  Left Ear: External ear normal.  Mouth/Throat: Oropharynx is clear and moist.  Eyes: Conjunctivae and EOM are normal. Pupils are equal, round, and reactive to light.  Neck: Normal range of  motion. Neck supple.  Cardiovascular: Normal rate and regular rhythm.  Exam reveals gallop.   Marked pedal edema above the knees.  Pulmonary/Chest: Effort normal.  Bilateral decreased breath sounds  Abdominal: Soft. Bowel sounds are normal. There is no tenderness.  Musculoskeletal: He exhibits edema.  Neurological: He is alert and oriented to person, place, and time.  Skin: Skin is warm and dry.  Nursing note and vitals reviewed.    UC Treatments / Results  Labs (all labs ordered are listed, but only abnormal results are displayed) Labs Reviewed - No data to display  EKG  EKG Interpretation None       Radiology No results found.  Procedures Procedures (including critical care time)  Medications Ordered in UC Medications - No data to display   Initial Impression / Assessment and Plan / UC Course  I have reviewed the triage vital signs and the nursing  notes.  Pertinent labs & imaging results that were available during my care of the patient were reviewed by me and considered in my medical decision making (see chart for details).     Final Clinical Impressions(s) / UC Diagnoses   Final diagnoses:  Systolic congestive heart failure, unspecified congestive heart failure chronicity (HCC)  SOB (shortness of breath)  Essential hypertension    New Prescriptions New Prescriptions   No medications on file  Transfer to emergency department   Robyn Haber, MD 06/10/16 1046

## 2016-06-10 NOTE — ED Notes (Signed)
Patient transported to Ultrasound 

## 2016-06-10 NOTE — ED Notes (Signed)
Dr. Jeanell Sparrow present and aware of pt's BP.

## 2016-06-10 NOTE — ED Triage Notes (Signed)
Pt here for 2 weeks of SOB. sts that it is worse when he lays down flat or when he is up and moving around. Denies any coughing. sts also loss of appetite. sts some ankle swelling. Pt very hypertensive today. sts not taking any meds. sts sometime he has HA/dizziness and blurred. vision.

## 2016-06-10 NOTE — ED Triage Notes (Signed)
Per Pt, Pt is coming from UC with complaints of SOB that started a week ago. Denies any chest pain or N/V/D. No hx of the same. Worse with exertion. Hx of HTN.

## 2016-06-10 NOTE — ED Notes (Signed)
Admitting at bedside. Pt. back from ultrasound.

## 2016-06-10 NOTE — ED Notes (Signed)
Pt. Given urinal at this time for urine specimen.

## 2016-06-10 NOTE — ED Notes (Signed)
Attempted report x1. 

## 2016-06-10 NOTE — ED Notes (Signed)
Nitro changed to 71mcg/min per EDP request.

## 2016-06-11 ENCOUNTER — Encounter (HOSPITAL_COMMUNITY): Payer: Self-pay | Admitting: General Practice

## 2016-06-11 ENCOUNTER — Inpatient Hospital Stay (HOSPITAL_COMMUNITY): Payer: BLUE CROSS/BLUE SHIELD

## 2016-06-11 DIAGNOSIS — N179 Acute kidney failure, unspecified: Secondary | ICD-10-CM

## 2016-06-11 DIAGNOSIS — N19 Unspecified kidney failure: Secondary | ICD-10-CM | POA: Diagnosis present

## 2016-06-11 DIAGNOSIS — R748 Abnormal levels of other serum enzymes: Secondary | ICD-10-CM

## 2016-06-11 DIAGNOSIS — R0600 Dyspnea, unspecified: Secondary | ICD-10-CM

## 2016-06-11 DIAGNOSIS — I10 Essential (primary) hypertension: Secondary | ICD-10-CM

## 2016-06-11 DIAGNOSIS — I5031 Acute diastolic (congestive) heart failure: Secondary | ICD-10-CM

## 2016-06-11 DIAGNOSIS — D649 Anemia, unspecified: Secondary | ICD-10-CM

## 2016-06-11 LAB — UREA NITROGEN, URINE: Urea Nitrogen, Ur: 220 mg/dL

## 2016-06-11 LAB — BASIC METABOLIC PANEL
ANION GAP: 14 (ref 5–15)
BUN: 51 mg/dL — AB (ref 6–20)
CHLORIDE: 102 mmol/L (ref 101–111)
CO2: 23 mmol/L (ref 22–32)
Calcium: 8.6 mg/dL — ABNORMAL LOW (ref 8.9–10.3)
Creatinine, Ser: 5.12 mg/dL — ABNORMAL HIGH (ref 0.61–1.24)
GFR calc Af Amer: 15 mL/min — ABNORMAL LOW (ref >60)
GFR calc non Af Amer: 13 mL/min — ABNORMAL LOW (ref >60)
Glucose, Bld: 171 mg/dL — ABNORMAL HIGH (ref 65–99)
POTASSIUM: 3.8 mmol/L (ref 3.5–5.1)
Sodium: 139 mmol/L (ref 135–145)

## 2016-06-11 LAB — HIV ANTIBODY (ROUTINE TESTING W REFLEX): HIV Screen 4th Generation wRfx: NONREACTIVE

## 2016-06-11 LAB — TROPONIN I
TROPONIN I: 0.19 ng/mL — AB (ref <0.03)
Troponin I: 0.2 ng/mL (ref <0.03)
Troponin I: 0.25 ng/mL (ref <0.03)

## 2016-06-11 LAB — ECHOCARDIOGRAM COMPLETE
Height: 73 in
WEIGHTICAEL: 4505.6 [oz_av]

## 2016-06-11 LAB — MRSA PCR SCREENING: MRSA by PCR: NEGATIVE

## 2016-06-11 LAB — PROTEIN, URINE, RANDOM: TOTAL PROTEIN, URINE: 112 mg/dL

## 2016-06-11 MED ORDER — CARVEDILOL 6.25 MG PO TABS
6.2500 mg | ORAL_TABLET | Freq: Two times a day (BID) | ORAL | Status: DC
  Administered 2016-06-11 – 2016-06-12 (×2): 6.25 mg via ORAL
  Filled 2016-06-11 (×2): qty 1

## 2016-06-11 MED ORDER — HYDRALAZINE HCL 50 MG PO TABS
50.0000 mg | ORAL_TABLET | Freq: Three times a day (TID) | ORAL | Status: DC
  Administered 2016-06-11 (×2): 50 mg via ORAL
  Filled 2016-06-11 (×2): qty 1

## 2016-06-11 MED ORDER — ISOSORBIDE MONONITRATE ER 30 MG PO TB24
30.0000 mg | ORAL_TABLET | Freq: Every day | ORAL | Status: DC
  Administered 2016-06-11 – 2016-06-13 (×3): 30 mg via ORAL
  Filled 2016-06-11 (×3): qty 1

## 2016-06-11 MED ORDER — LABETALOL HCL 5 MG/ML IV SOLN
10.0000 mg | Freq: Four times a day (QID) | INTRAVENOUS | Status: DC | PRN
  Administered 2016-06-11 – 2016-06-14 (×8): 10 mg via INTRAVENOUS
  Filled 2016-06-11 (×8): qty 4

## 2016-06-11 MED ORDER — HYDROCODONE-ACETAMINOPHEN 5-325 MG PO TABS
1.0000 | ORAL_TABLET | Freq: Once | ORAL | Status: AC
  Administered 2016-06-11: 1 via ORAL
  Filled 2016-06-11: qty 1

## 2016-06-11 MED ORDER — PERFLUTREN LIPID MICROSPHERE
1.0000 mL | INTRAVENOUS | Status: AC | PRN
  Administered 2016-06-11: 2 mL via INTRAVENOUS
  Filled 2016-06-11: qty 10

## 2016-06-11 MED ORDER — HYDRALAZINE HCL 50 MG PO TABS
100.0000 mg | ORAL_TABLET | Freq: Three times a day (TID) | ORAL | Status: DC
  Administered 2016-06-11 – 2016-06-15 (×12): 100 mg via ORAL
  Filled 2016-06-11 (×12): qty 2

## 2016-06-11 NOTE — Progress Notes (Signed)
PROGRESS NOTE  Nathan Campbell  QQV:[id number] DOB: 05-21-1975 DOA: 06/10/2016 PCP: No PCP Per Patient Outpatient Specialists:  Subjective: Denies any complaints this morning, seen sitting at bedside, no SOB.  Brief Narrative:  Nathan Campbell is a 41 y.o. male with medical history significant for polysubstance abuse in early remission, and has been told he had high blood pressure, though with no PCP for many years and no medications, now presenting from urgent care where he was seen for progressive dyspnea, orthopnea, and bilateral lower extremity edema.  Patient reports that he is in his usual state until approximately 2 weeks ago when he noted the insidious development of exertional dyspnea and orthopnea. He also was noted to have ankle swelling develop around that same time. Over the ensuing 2 weeks, the exertional dyspnea, swelling, and orthopnea all worsened. There is been no fevers or chills over this interval and no significant chest pain or palpitations. Patient endorses a history of cocaine abuse, reporting his last used 2 of been more than a month ago. He also reports that he quit smoking 2 weeks ago with the onset of this current symptoms and has not been drinking any alcohol and several weeks. He was noted to be hypertensive to 203/136 and mildly tachycardic at the urgent care and was directed to the ED for further evaluation.  Assessment & Plan:   Principal Problem:   Acute CHF (congestive heart failure) (HCC) Active Problems:   Hypertensive urgency   Kidney disease   Elevated troponin   Elevated transaminase level   Normocytic anemia   Renal failure   Acute CHF  - Pt presents with progressive SOB, b/l ankle swelling, and orthopnea; found to be markedly hypertensive, mildly tachycardic, and with elevated BNP and S3 gallop  - Suspect this is secondary to CHF, likely from uncontrolled HTN or cocaine abuse; troponin elevated, but no anginal complaint to suggest acute ischemic  etiology  - He was given 80 mg IV Lasix in ED and started on nitroglycerin infusion  - Continue diuresis with Lasix 40 mg IV q12h. - Follow daily wts and strict I/O's, SLIV, fluid-restrict diet - No ACE/ARB secondary to renal insufficiency. - Echo pending  Hypertensive urgency  - BP elevated to 220/150 range initially in setting of acute CHF  - Improved with 20 mg IVP hydralazine, 80 mg IVP Lasix, and NTG infusion in ED  - Volume is likely affecting this, continue diuresis. Discontinue Nitro drip. - Start on Coreg and Imdur, continue Hydralazine and Lasix  Kidney disease of uncertain chronicity  - SCr is 5.18 on admission with no prior available  - Renal US performed with bilateral medical renal disease, no mass or hydro  - Check urine studies, Renal to evaluate  Elevated troponin  - Troponin elevated to 0.23 on admission with no chest pain, ASA added - Likely represents demand ischemia in setting of acute CHF with hypertensive urgency  - Plan to monitor on telemetry for ischemic changes, trend troponin, repeat EKG.  Elevated transaminases  - AST 42 and ALT 126 on admission with normal bilirubin and no abdominal pain  - Likely secondary to CHF and congestive hepatopathy   - May need further workup if fails to resolves with treatment of CHF    DVT prophylaxis: heparin SQ Code Status: Full Code Family Communication:  Disposition Plan:  Diet: Diet Heart Room service appropriate? Yes; Fluid consistency: Thin; Fluid restriction: 1500 mL Fluid  Consultants:   Nephrology.  Cardiology  Procedures:   None  Antimicrobials:   None  Objective: Vitals:   06/11/16 0630 06/11/16 0654 06/11/16 0800 06/11/16 0802  BP: (!) 175/113 (!) 189/125 (!) 179/103 (!) 179/103  Pulse: 94 96 97 (!) 101  Resp:  17 17 10   Temp:    98.1 F (36.7 C)  TempSrc:    Oral  SpO2: 98% 100% 96% 96%  Weight:      Height:        Intake/Output Summary (Last 24 hours) at 06/11/16 1031 Last data  filed at 06/11/16 0909  Gross per 24 hour  Intake                3 ml  Output             3300 ml  Net            -3297 ml   Filed Weights   06/10/16 2100 06/11/16 0445  Weight: 127.9 kg (282 lb) 127.7 kg (281 lb 9.6 oz)    Examination: General exam: Appears calm and comfortable  Respiratory system: Clear to auscultation. Respiratory effort normal. Cardiovascular system: S1 & S2 heard, RRR. No JVD, murmurs, rubs, gallops or clicks. No pedal edema. Gastrointestinal system: Abdomen is nondistended, soft and nontender. No organomegaly or masses felt. Normal bowel sounds heard. Central nervous system: Alert and oriented. No focal neurological deficits. Extremities: Symmetric 5 x 5 power. Skin: No rashes, lesions or ulcers Psychiatry: Judgement and insight appear normal. Mood & affect appropriate.   Data Reviewed: I have personally reviewed following labs and imaging studies  CBC:  Recent Labs Lab 06/10/16 1626  WBC 7.3  HGB 11.8*  HCT 35.7*  MCV 91.3  PLT 096   Basic Metabolic Panel:  Recent Labs Lab 06/10/16 1626 06/11/16 0019  NA 138 139  K 3.5 3.8  CL 103 102  CO2 24 23  GLUCOSE 117* 171*  BUN 49* 51*  CREATININE 5.18* 5.12*  CALCIUM 8.5* 8.6*   GFR: Estimated Creatinine Clearance: 27.4 mL/min (A) (by C-G formula based on SCr of 5.12 mg/dL (H)). Liver Function Tests:  Recent Labs Lab 06/10/16 1626  AST 42*  ALT 126*  ALKPHOS 115  BILITOT 1.0  PROT 5.7*  ALBUMIN 3.5   No results for input(s): LIPASE, AMYLASE in the last 168 hours. No results for input(s): AMMONIA in the last 168 hours. Coagulation Profile: No results for input(s): INR, PROTIME in the last 168 hours. Cardiac Enzymes:  Recent Labs Lab 06/10/16 1626 06/10/16 2335 06/11/16 0019  TROPONINI 0.23* 0.20* 0.19*   BNP (last 3 results) No results for input(s): PROBNP in the last 8760 hours. HbA1C: No results for input(s): HGBA1C in the last 72 hours. CBG: No results for input(s):  GLUCAP in the last 168 hours. Lipid Profile: No results for input(s): CHOL, HDL, LDLCALC, TRIG, CHOLHDL, LDLDIRECT in the last 72 hours. Thyroid Function Tests: No results for input(s): TSH, T4TOTAL, FREET4, T3FREE, THYROIDAB in the last 72 hours. Anemia Panel: No results for input(s): VITAMINB12, FOLATE, FERRITIN, TIBC, IRON, RETICCTPCT in the last 72 hours. Urine analysis:    Component Value Date/Time   COLORURINE STRAW (A) 06/10/2016 1743   APPEARANCEUR CLEAR 06/10/2016 1743   LABSPEC 1.006 06/10/2016 1743   PHURINE 7.0 06/10/2016 1743   GLUCOSEU NEGATIVE 06/10/2016 1743   HGBUR MODERATE (A) 06/10/2016 1743   BILIRUBINUR NEGATIVE 06/10/2016 1743   KETONESUR NEGATIVE 06/10/2016 1743   PROTEINUR 100 (A) 06/10/2016 1743   NITRITE NEGATIVE 06/10/2016 1743   LEUKOCYTESUR NEGATIVE 06/10/2016 1743  Sepsis Labs: @LABRCNTIP (procalcitonin:4,lacticidven:4)  ) Recent Results (from the past 240 hour(s))  MRSA PCR Screening     Status: None   Collection Time: 06/10/16  9:47 PM  Result Value Ref Range Status   MRSA by PCR NEGATIVE NEGATIVE Final    Comment:        The GeneXpert MRSA Assay (FDA approved for NASAL specimens only), is one component of a comprehensive MRSA colonization surveillance program. It is not intended to diagnose MRSA infection nor to guide or monitor treatment for MRSA infections.      Invalid input(s): PROCALCITONIN, LACTICACIDVEN   Radiology Studies: Dg Chest 2 View  Result Date: 06/10/2016 CLINICAL DATA:  Shortness of Breath EXAM: CHEST  2 VIEW COMPARISON:  None. FINDINGS: Cardiac shadow is enlarged. The lungs are well aerated bilaterally. No focal infiltrate or sizable effusion is seen. No bony abnormality is noted. IMPRESSION: No acute abnormality noted. Electronically Signed   By: Inez Catalina M.D.   On: 06/10/2016 11:50   US Renal  Result Date: 06/10/2016 CLINICAL DATA:  Acute renal failure, history hypertension, smoking EXAM: RENAL / URINARY  TRACT ULTRASOUND COMPLETE COMPARISON:  None FINDINGS: Right Kidney: Length: 11.6 cm. Normal cortical thickness. Mildly increased cortical echogenicity. No mass, hydronephrosis or shadowing calcification. Left Kidney: Length: 11.6 cm. Normal cortical thickness. Mildly increased cortical echogenicity. No mass, hydronephrosis or shadowing calcification. Bladder: Normal appearance IMPRESSION: Medical renal disease changes of both kidneys without evidence of renal mass or hydronephrosis. Electronically Signed   By: Lavonia Dana M.D.   On: 06/10/2016 19:27        Scheduled Meds: . furosemide  40 mg Intravenous Q12H  . heparin  5,000 Units Subcutaneous Q8H  . hydrALAZINE  50 mg Oral Q8H  . sodium chloride flush  3 mL Intravenous Q12H   Continuous Infusions: . nitroGLYCERIN 90 mcg/min (06/11/16 0200)     LOS: 1 day    Time spent: 35 minutes    Arshdeep Bolger A, MD [facility] Pager [phone]  If 7PM-7AM, please contact night-coverage [email or web address] Password [email or web address] 06/11/2016, 10:31 AM

## 2016-06-11 NOTE — Consult Note (Signed)
Reason for Consult: acute CHF and elevated troponin    Referring Physician: Dr. Hartford Poli   PCP:  No PCP Per Patient  Primary Cardiologist: new  Nathan Campbell is an 41 y.o. male.    Chief Complaint: admitted 06/10/16 with SOB and HTN, no chest pain found to be in acute HF.    HPI:  69 yoM with no prior CAD but + HTN was sent from UC yesterday after presenting with DOE and elevated BP.  On arrival here BP was 203/136   EKG SR at 99 with prolonged Qtc at 513 ms.   BNP 1211 Troponin 0.23-->0.20-->0.19-->0.19-->0.25  Cr 5.18   LFTs elevated  CXR was clear   He was started on IV NTG, IV hydralazine and IV lasix.    Currently sitting in bed and HR is 120 -he feels better.  Symptoms began 2 weeks ago, prior to that he was in usual state of health.  He became DOE this progressed to being unable to lie flat in bed due to SOB.  His appetite decreased and he was constipated as well.  + lower ext and abd edema.    He is now neg 2,976 and wt is down 1 lb.  On one dose of Lasix 80 and now 40 mg BID.  He is now on BB and imdur and hydralazine po  Pt has had no chest pain.  He did do cocaine but last use was a month ago.  Stopped smoking 2 weeks ago.  2 cousins died recently with some type of heart problem- he was not sure.  Past Medical History:  Diagnosis Date  . CHF (congestive heart failure) ([place])   . Dyspnea   . Hypertension     Past Surgical History:  Procedure Laterality Date  . NO PAST SURGERIES      Family History  Problem Relation Age of Onset  . Diabetes Father    Social History:  reports that he has been smoking Cigarettes.  He has a 2.50 pack-year smoking history. He has never used smokeless tobacco. He reports that he drinks alcohol. He reports that he does not use drugs.  Allergies: No Known Allergies  OUTPATIENT MEDICATIONS: No current facility-administered medications on file prior to encounter.    No current outpatient prescriptions on file prior to  encounter.   CURRENT MEDICATIONS: Scheduled Meds: . carvedilol  6.25 mg Oral BID WC  . furosemide  40 mg Intravenous Q12H  . heparin  5,000 Units Subcutaneous Q8H  . hydrALAZINE  50 mg Oral Q8H  . isosorbide mononitrate  30 mg Oral Daily  . sodium chloride flush  3 mL Intravenous Q12H   Continuous Infusions: PRN Meds:.sodium chloride, acetaminophen, ALPRAZolam, hydrALAZINE, labetalol, ondansetron (ZOFRAN) IV, sodium chloride flush, zolpidem   Results for orders placed or performed during the hospital encounter of 06/10/16 (from the past 48 hour(s))  CBC     Status: Abnormal   Collection Time: 06/10/16  4:26 PM  Result Value Ref Range   WBC 7.3 4.0 - 10.5 K/uL   RBC 3.91 (L) 4.22 - 5.81 MIL/uL   Hemoglobin 11.8 (L) 13.0 - 17.0 g/dL   HCT 35.7 (L) 39.0 - 52.0 %   MCV 91.3 78.0 - 100.0 fL   MCH 30.2 26.0 - 34.0 pg   MCHC 33.1 30.0 - 36.0 g/dL   RDW 14.7 11.5 - 15.5 %   Platelets 170 150 - 400 K/uL  Comprehensive metabolic panel  Status: Abnormal   Collection Time: 06/10/16  4:26 PM  Result Value Ref Range   Sodium 138 135 - 145 mmol/L   Potassium 3.5 3.5 - 5.1 mmol/L   Chloride 103 101 - 111 mmol/L   CO2 24 22 - 32 mmol/L   Glucose, Bld 117 (H) 65 - 99 mg/dL   BUN 49 (H) 6 - 20 mg/dL   Creatinine, Ser 5.18 (H) 0.61 - 1.24 mg/dL   Calcium 8.5 (L) 8.9 - 10.3 mg/dL   Total Protein 5.7 (L) 6.5 - 8.1 g/dL   Albumin 3.5 3.5 - 5.0 g/dL   AST 42 (H) 15 - 41 U/L   ALT 126 (H) 17 - 63 U/L   Alkaline Phosphatase 115 38 - 126 U/L   Total Bilirubin 1.0 0.3 - 1.2 mg/dL   GFR calc non Af Amer 13 (L) >60 mL/min   GFR calc Af Amer 15 (L) >60 mL/min    Comment: (NOTE) The eGFR has been calculated using the CKD EPI equation. This calculation has not been validated in all clinical situations. eGFR's persistently <60 mL/min signify possible Chronic Kidney Disease.    Anion gap 11 5 - 15  Brain natriuretic peptide (order ONLY if patient c/o SOB)     Status: Abnormal   Collection  Time: 06/10/16  4:26 PM  Result Value Ref Range   B Natriuretic Peptide 1,211.1 (H) 0.0 - 100.0 pg/mL  Troponin I     Status: Abnormal   Collection Time: 06/10/16  4:26 PM  Result Value Ref Range   Troponin I 0.23 (HH) <0.03 ng/mL    Comment: CRITICAL RESULT CALLED TO, READ BACK BY AND VERIFIED WITH: E HOWELL,RN [id number] 06/10/2016 WBOND   Urinalysis, Routine w reflex microscopic     Status: Abnormal   Collection Time: 06/10/16  5:43 PM  Result Value Ref Range   Color, Urine STRAW (A) YELLOW   APPearance CLEAR CLEAR   Specific Gravity, Urine 1.006 1.005 - 1.030   pH 7.0 5.0 - 8.0   Glucose, UA NEGATIVE NEGATIVE mg/dL   Hgb urine dipstick MODERATE (A) NEGATIVE   Bilirubin Urine NEGATIVE NEGATIVE   Ketones, ur NEGATIVE NEGATIVE mg/dL   Protein, ur 100 (A) NEGATIVE mg/dL   Nitrite NEGATIVE NEGATIVE   Leukocytes, UA NEGATIVE NEGATIVE   RBC / HPF 6-30 0 - 5 RBC/hpf   WBC, UA 0-5 0 - 5 WBC/hpf   Bacteria, UA NONE SEEN NONE SEEN   Squamous Epithelial / LPF 0-5 (A) NONE SEEN  Urine rapid drug screen (hosp performed)     Status: None   Collection Time: 06/10/16  5:43 PM  Result Value Ref Range   Opiates NONE DETECTED NONE DETECTED   Cocaine NONE DETECTED NONE DETECTED   Benzodiazepines NONE DETECTED NONE DETECTED   Amphetamines NONE DETECTED NONE DETECTED   Tetrahydrocannabinol NONE DETECTED NONE DETECTED   Barbiturates NONE DETECTED NONE DETECTED    Comment:        DRUG SCREEN FOR MEDICAL PURPOSES ONLY.  IF CONFIRMATION IS NEEDED FOR ANY PURPOSE, NOTIFY LAB WITHIN 5 DAYS.        LOWEST DETECTABLE LIMITS FOR URINE DRUG SCREEN Drug Class       Cutoff (ng/mL) Amphetamine      1000 Barbiturate      200 Benzodiazepine   388 Tricyclics       828 Opiates          300 Cocaine  300 THC              50   Sodium, urine, random     Status: None   Collection Time: 06/10/16  5:43 PM  Result Value Ref Range   Sodium, Ur 109 mmol/L  Urea nitrogen, urine     Status: None    Collection Time: 06/10/16  5:43 PM  Result Value Ref Range   Urea Nitrogen, Ur 220 Not Estab. mg/dL    Comment: (NOTE) Performed At: [facility] [place], [place] [id number] Lindon Romp MD QH:[id number]   Creatinine, urine, random     Status: None   Collection Time: 06/10/16  5:43 PM  Result Value Ref Range   Creatinine, Urine 43.37 mg/dL  Protein, urine, random     Status: None   Collection Time: 06/10/16  5:43 PM  Result Value Ref Range   Total Protein, Urine 112 mg/dL    Comment: NO NORMAL RANGE ESTABLISHED FOR THIS TEST  MRSA PCR Screening     Status: None   Collection Time: 06/10/16  9:47 PM  Result Value Ref Range   MRSA by PCR NEGATIVE NEGATIVE    Comment:        The GeneXpert MRSA Assay (FDA approved for NASAL specimens only), is one component of a comprehensive MRSA colonization surveillance program. It is not intended to diagnose MRSA infection nor to guide or monitor treatment for MRSA infections.   Troponin I (q 6hr x 3)     Status: Abnormal   Collection Time: 06/10/16 11:35 PM  Result Value Ref Range   Troponin I 0.20 (HH) <0.03 ng/mL    Comment: CRITICAL VALUE NOTED.  VALUE IS CONSISTENT WITH PREVIOUSLY REPORTED AND CALLED VALUE.  Basic metabolic panel     Status: Abnormal   Collection Time: 06/11/16 12:19 AM  Result Value Ref Range   Sodium 139 135 - 145 mmol/L   Potassium 3.8 3.5 - 5.1 mmol/L   Chloride 102 101 - 111 mmol/L   CO2 23 22 - 32 mmol/L   Glucose, Bld 171 (H) 65 - 99 mg/dL   BUN 51 (H) 6 - 20 mg/dL   Creatinine, Ser 5.12 (H) 0.61 - 1.24 mg/dL   Calcium 8.6 (L) 8.9 - 10.3 mg/dL   GFR calc non Af Amer 13 (L) >60 mL/min   GFR calc Af Amer 15 (L) >60 mL/min    Comment: (NOTE) The eGFR has been calculated using the CKD EPI equation. This calculation has not been validated in all clinical situations. eGFR's persistently <60 mL/min signify possible Chronic Kidney Disease.    Anion gap 14 5 - 15  Troponin I (q 6hr  x 3)     Status: Abnormal   Collection Time: 06/11/16 12:19 AM  Result Value Ref Range   Troponin I 0.19 (HH) <0.03 ng/mL    Comment: CRITICAL VALUE NOTED.  VALUE IS CONSISTENT WITH PREVIOUSLY REPORTED AND CALLED VALUE.  Troponin I (q 6hr x 3)     Status: Abnormal   Collection Time: 06/11/16 10:57 AM  Result Value Ref Range   Troponin I 0.25 (HH) <0.03 ng/mL    Comment: CRITICAL VALUE NOTED.  VALUE IS CONSISTENT WITH PREVIOUSLY REPORTED AND CALLED VALUE.   Dg Chest 2 View  Result Date: 06/10/2016 CLINICAL DATA:  Shortness of Breath EXAM: CHEST  2 VIEW COMPARISON:  None. FINDINGS: Cardiac shadow is enlarged. The lungs are well aerated bilaterally. No focal infiltrate or sizable effusion is seen.  No bony abnormality is noted. IMPRESSION: No acute abnormality noted. Electronically Signed   By: Inez Catalina M.D.   On: 06/10/2016 11:50   US Renal  Result Date: 06/10/2016 CLINICAL DATA:  Acute renal failure, history hypertension, smoking EXAM: RENAL / URINARY TRACT ULTRASOUND COMPLETE COMPARISON:  None FINDINGS: Right Kidney: Length: 11.6 cm. Normal cortical thickness. Mildly increased cortical echogenicity. No mass, hydronephrosis or shadowing calcification. Left Kidney: Length: 11.6 cm. Normal cortical thickness. Mildly increased cortical echogenicity. No mass, hydronephrosis or shadowing calcification. Bladder: Normal appearance IMPRESSION: Medical renal disease changes of both kidneys without evidence of renal mass or hydronephrosis. Electronically Signed   By: Lavonia Dana M.D.   On: 06/10/2016 19:27    ROS: General:no colds or fevers, + weight increase Skin:no rashes or ulcers HEENT:no blurred vision, no congestion CV:see HPI PUL:see HPI GI:no diarrhea +constipation no melena, no indigestion-decreased appetite GU:no hematuria, no dysuria MS:no joint pain, no claudication Neuro:no syncope, no lightheadedness Endo:no diabetes, no thyroid disease   Blood pressure (!) 198/113, pulse (!)  116, temperature 98.9 F (37.2 C), temperature source Oral, resp. rate (!) 35, height '6\' 1"'  (1.854 m), weight 281 lb 9.6 oz (127.7 kg), SpO2 98 %.  Wt Readings from Last 3 Encounters:  06/11/16 281 lb 9.6 oz (127.7 kg)    PE: General:Pleasant affect, NAD Skin:Warm and dry, brisk capillary refill HEENT:normocephalic, sclera clear, mucus membranes moist Neck:supple, no JVD, no bruits  Heart:S1S2 RRR tachycardic without murmur,+ gallop, no rub or click Lungs:clear without rales, rhonchi, or wheezes TWS:FKCL, non tender, + BS, do not palpate liver spleen or masses Ext:1-2+ lower ext edema, 2+ pedal pulses, 2+ radial pulses Neuro:alert and oriented X 3, MAE, follows commands, + facial symmetry    Assessment/Plan Principal Problem:   Acute CHF (congestive heart failure) (HCC) Active Problems:   Hypertensive urgency   Kidney disease   Elevated troponin   Elevated transaminase level   Normocytic anemia   Renal failure  Elevated troponin most likely from HTN and renal failure. -echo pending  Acute CHF with elevated BNP though CXR without acute HF. - diuresing on lasix now neg 2,946  And wt down from 282 to 281 -Echo pending  Dr. Sallyanne Kuster to see  -elevated LFTs  HTN urgency BP now 198/113   Acute/chronic renal failure with Ultrasound medical renal disease pt has not seen MD in long time.   Tachycardia at 120 sitting on bed.  Was better controlled to 95 to 105 until this afternoon.      Nathan Campbell  Nurse Practitioner Certified [place] Pager [phone] or after 5pm or weekends call [phone] 06/11/2016, 3:24 PM     I have seen and examined the patient along with Nathan Kicks  NP.  I have reviewed the chart, notes and new data.  I agree with NP's note.  Key new complaints: better, still a little dyspneic Key examination changes: S3 present, mild edema, no JVD Key new findings / data: reviewed echo at bedside. Severe LVH, EF 40-45%, global hypok,  2+ AI, restrictive LV filling (severely elevated LA pressure).  PLAN:  Overall impression is malignant HTN with advanced renal failure and severe cardiomyopathy with predominantly diastolic dysfunction, but also mildly depressed LVEF.  Resting tachycardia is due to severe left heart failure.  Recommend diuresis and aggressive Rx of HBP is indicated. Unable to use RAAS inhibitors. Good candidate for Bidil or equivalent hydralazine/nitrates combo. If BP is well controlled, anticipate recovery of LV systolic function,  but diastolic dysfunction will persist.  Troponin elevation is mild and due to CHF, not compatible with a true acute coronary event.  Sanda Klein, MD, [place] [phone] 06/11/2016, 5:43 PM

## 2016-06-11 NOTE — Plan of Care (Signed)
Problem: Pain Managment: Goal: General experience of comfort will improve Outcome: Progressing Pt c/o 10/10 head pain. On call aware. Norco given. Will assess pt for effectiveness.   Problem: Fluid Volume: Goal: Ability to maintain a balanced intake and output will improve Outcome: Progressing Pt started on IV Lasix in ED. Pt on fluid restriction of 1500 ml/day. Pt verbalized that he would be compliant with fluid restriction. Will continue to monitor pt.

## 2016-06-11 NOTE — Progress Notes (Signed)
Pt head ache elevated to 10/10 along with bp that remains elevated 189/120 despite interventions. On call notified. Will continue to monitor.

## 2016-06-11 NOTE — Progress Notes (Signed)
  Echocardiogram 2D Echocardiogram with Definity has been performed.  Tresa Res 06/11/2016, 1:48 PM

## 2016-06-11 NOTE — Consult Note (Signed)
[facility] Consultation Note Requesting Physician:  Dr. Hartford Poli Primary Nephrologist: N/A Reason for Consult:  Elevated serum creatinine  HPI: The patient is a 41 y.o. year-old male with PMH sig for PSA (including recent cocaine), HTN (has not had primary MD or taken any medicines for years) who went to Urgent Care with c/o SOB, leg swelling - found to be severely hypertensive and sent to [facility] BP as high as 250/150, neg CXR, creatinine of 5.18 (no prior creatinines for comparison), mild anemia (11.8), elevated transaminases, elevated BNP. Rec'd IV lasix in the ED, started on a nitro drip. Seen by cardiology b/o elevated troponins. ECHO showed EF 35%, Grade 2 DD. Getting lasix, diuresing well on relatively small doses given his creatinine (40-80 mg). From renal standpoint workup so far has included renal ultrasound with 11.6 cm kidneys bilaterally, mild increase echogenicity.  Urinalysis shows 100 mg% protein, 6-30 RBC's. Calculated protein:creatinine ratio 2.58 grams.   Pt denies any prior knowledge of kidney disease. No family history. Occasional NSAID use for headaches (says not frequent). Leg swelling for 2 weeks, no change in appearance of urine (no tea or coca cola colored urine, sudsy urine). No rash or athralgias. + SOB, feels heart "pounding in his chest".    Creatinine, Ser  Date/Time Value Ref Range Status  06/11/2016 12:19 AM 5.12 (H) 0.61 - 1.24 mg/dL Final  06/10/2016 04:26 PM 5.18 (H) 0.61 - 1.24 mg/dL Final     Past Medical History:  Diagnosis Date  . CHF (congestive heart failure) ([place])   . Dyspnea   . Hypertension     Past Surgical History:  Procedure Laterality Date  . NO PAST SURGERIES       Family History  Problem Relation Age of Onset  . Diabetes Father    Social History:  reports that he has been smoking Cigarettes.  He has a 2.50 pack-year smoking history. He has never used smokeless tobacco. He reports that he drinks alcohol. He reports that he does not use drugs.  Works in [employer]. Not married but has a daughter who is 56.  Allergies: No Known Allergies  Home medications: Prior to Admission medications   Not on File    Inpatient medications: . carvedilol  6.25 mg Oral BID WC  . furosemide  40 mg Intravenous Q12H  . heparin  5,000 Units Subcutaneous Q8H  . hydrALAZINE  100 mg Oral Q8H  . isosorbide mononitrate  30 mg Oral Daily  . sodium chloride flush  3 mL Intravenous Q12H    Review of Systems Gen:  + headache, no fever, chills, sweats.  No weight loss. HEENT:  No visual change, sore throat, difficulty swallowing. Resp:  + DOE, PND, orthopnea  No hemoptysis Cardiac:  No chest pain, +orthopnea, PND.  +edema. GI:   Denies abdominal pain.  + nausea/vomiting today and some random episodes nausea  GU:  Denies difficulty or change in voiding.  No change in urine color.     MS:  Denies joint pain or swelling.   Derm:  Denies skin rash or itching.  No chronic skin conditions.  Neuro:   Denies focal weakness, memory problems, hx stroke or TIA.   Psych:  Denies symptoms of depression of anxiety.  No hallucination.    Physical Exam:  Blood pressure (!) 168/103, pulse (!) 116, temperature 98.9 F (37.2 C), temperature source Oral, resp. rate (!) 35, height 6\' 1"  (1.854 m), weight 127.7 kg (281 lb 9.6 oz), SpO2 98 %.  Gen: large  framed youn AAM VS as noted Skin: no rash, cyanosis Neck: no JVD, no bruits or LAN Chest: Grossly clear Tachy 120 S4 S1S2 S3 No murmur Abdomen: soft, not distended. No abd bruits Ext: 1=2+ pitting edema both LE's Neuro: alert, Ox3, no focal deficit Skin with tattoos   Recent Labs Lab 06/10/16 1626 06/11/16 0019  NA 138 139  K 3.5 3.8  CL 103 102  CO2 24 23  GLUCOSE 117* 171*  BUN 49* 51*  CREATININE 5.18* 5.12*  CALCIUM 8.5* 8.6*    Recent Labs Lab 06/10/16 1626  AST 42*  ALT 126*  ALKPHOS 115  BILITOT 1.0  PROT 5.7*  ALBUMIN 3.5     Recent Labs Lab 06/10/16 1626  WBC 7.3  HGB 11.8*   HCT 35.7*  MCV 91.3  PLT 170    Recent Labs Lab 06/10/16 1626 06/10/16 2335 06/11/16 0019 06/11/16 1057  TROPONINI 0.23* 0.20* 0.19* 0.25*   Xrays/Other Studies: Dg Chest 2 View  Result Date: 06/10/2016 CLINICAL DATA:  Shortness of Breath EXAM: CHEST  2 VIEW COMPARISON:  None. FINDINGS: Cardiac shadow is enlarged. The lungs are well aerated bilaterally. No focal infiltrate or sizable effusion is seen. No bony abnormality is noted. IMPRESSION: No acute abnormality noted. Electronically Signed   By: Inez Catalina M.D.   On: 06/10/2016 11:50   US Renal  Result Date: 06/10/2016 CLINICAL DATA:  Acute renal failure, history hypertension, smoking EXAM: RENAL / URINARY TRACT ULTRASOUND COMPLETE COMPARISON:  None FINDINGS: Right Kidney: Length: 11.6 cm. Normal cortical thickness. Mildly increased cortical echogenicity. No mass, hydronephrosis or shadowing calcification. Left Kidney: Length: 11.6 cm. Normal cortical thickness. Mildly increased cortical echogenicity. No mass, hydronephrosis or shadowing calcification. Bladder: Normal appearance IMPRESSION: Medical renal disease changes of both kidneys without evidence of renal mass or hydronephrosis. Electronically Signed   By: Lavonia Dana M.D.   On: 06/10/2016 19:27    Background: 41 y.o. year-old male with PMH sig for PSA (including recent cocaine), HTN (has not had primary MD or taken any medicines for years) who went to Urgent Care with c/o SOB, leg swelling - found to be severely hypertensive and sent to [facility]. BP as high as 250/150, neg CXR, creatinine of 5.18 (no prior creatinines for comparison), mild anemia (11.8), elevated transaminases, elevated BNP. ECHO showed EF 35%, Grade 2 DD. Getting lasix, diuresing well on relatively small doses given his creatinine (40-80 mg). From renal standpoint workup so far has included renal ultrasound with 11.6 cm kidneys bilaterally, mild increase echogenicity.  Urinalysis shows 100 mg% protein, 6-30 RBC's.  Calculated protein:creatinine ratio 2.58 grams. We are asked to see.  Assessment/Recommendations  1. CKD 4/5 - unknown if acute or chronic. Kidney sizes preserved. Echogenicity only mildly increased. Proteinuria heavy, near nephrotic. If renal disease was all related to HTN would expect small kidneys. Proteinuria CAN be heavy with accelerated HTN so that piece doesn't help sort out. Needs at least a serologic workup including ANA, complements, hepatitis serologies. HIV already pending. Once BP controlled, will consider diagnostic renal biopsy 2. Hypertensive urgency - longstanding untreated HTN. Cardiology and primary service managing meds. Avoid ACE/ARB with this advanced kidney disease. Agree with diuresis.  3. Acute systolic and diastolic HF - EF 64% by ECHO - per cards 4. Mild anemia - no ESA need at this time 5. CKD-MBD - check PTH.   Jamal Maes,  MD [facility] [phone] pager 06/11/2016, 6:38 PM

## 2016-06-11 NOTE — Progress Notes (Addendum)
Pt bp remains elevated 193/120 with nitro infusing at 90 mcg/min. Pt also c/o 10/10 head pain. On call notified. Order for norco, labetalol IV and po hydralazine given. Trop elevated *0.19. On call notified. Will continue to monitor pt and assess for pain.

## 2016-06-12 DIAGNOSIS — I5021 Acute systolic (congestive) heart failure: Secondary | ICD-10-CM

## 2016-06-12 DIAGNOSIS — I160 Hypertensive urgency: Secondary | ICD-10-CM

## 2016-06-12 DIAGNOSIS — N289 Disorder of kidney and ureter, unspecified: Secondary | ICD-10-CM

## 2016-06-12 LAB — BASIC METABOLIC PANEL
Anion gap: 12 (ref 5–15)
BUN: 48 mg/dL — AB (ref 6–20)
CALCIUM: 8.6 mg/dL — AB (ref 8.9–10.3)
CHLORIDE: 97 mmol/L — AB (ref 101–111)
CO2: 30 mmol/L (ref 22–32)
CREATININE: 5.67 mg/dL — AB (ref 0.61–1.24)
GFR calc Af Amer: 13 mL/min — ABNORMAL LOW (ref >60)
GFR calc non Af Amer: 12 mL/min — ABNORMAL LOW (ref >60)
Glucose, Bld: 124 mg/dL — ABNORMAL HIGH (ref 65–99)
Potassium: 3.5 mmol/L (ref 3.5–5.1)
SODIUM: 139 mmol/L (ref 135–145)

## 2016-06-12 LAB — HEMOGLOBIN A1C
HEMOGLOBIN A1C: 4.7 % — AB (ref 4.8–5.6)
MEAN PLASMA GLUCOSE: 88 mg/dL

## 2016-06-12 MED ORDER — POTASSIUM CHLORIDE CRYS ER 20 MEQ PO TBCR
20.0000 meq | EXTENDED_RELEASE_TABLET | Freq: Once | ORAL | Status: AC
  Administered 2016-06-12: 20 meq via ORAL
  Filled 2016-06-12: qty 1

## 2016-06-12 MED ORDER — AMLODIPINE BESYLATE 10 MG PO TABS
10.0000 mg | ORAL_TABLET | Freq: Every day | ORAL | Status: DC
  Administered 2016-06-12 – 2016-06-15 (×4): 10 mg via ORAL
  Filled 2016-06-12 (×5): qty 1

## 2016-06-12 MED ORDER — CARVEDILOL 12.5 MG PO TABS
12.5000 mg | ORAL_TABLET | Freq: Two times a day (BID) | ORAL | Status: DC
Start: 2016-06-12 — End: 2016-06-13
  Administered 2016-06-12 – 2016-06-13 (×2): 12.5 mg via ORAL
  Filled 2016-06-12 (×2): qty 1

## 2016-06-12 NOTE — Progress Notes (Signed)
PROGRESS NOTE  Nathan Campbell  NOI:[id number] DOB: 06-02-1975 DOA: 06/10/2016 PCP: No PCP Per Patient Outpatient Specialists:  Subjective: Lower extremity edema improved since yesterday, slight shortness of breath. Blood pressure continues to be elevated.  Brief Narrative:  Nathan Campbell is a 41 y.o. male with medical history significant for polysubstance abuse in early remission, and has been told he had high blood pressure, though with no PCP for many years and no medications, now presenting from urgent care where he was seen for progressive dyspnea, orthopnea, and bilateral lower extremity edema.  Patient reports that he is in his usual state until approximately 2 weeks ago when he noted the insidious development of exertional dyspnea and orthopnea. He also was noted to have ankle swelling develop around that same time. Over the ensuing 2 weeks, the exertional dyspnea, swelling, and orthopnea all worsened. There is been no fevers or chills over this interval and no significant chest pain or palpitations. Patient endorses a history of cocaine abuse, reporting his last used 2 of been more than a month ago. He also reports that he quit smoking 2 weeks ago with the onset of this current symptoms and has not been drinking any alcohol and several weeks. He was noted to be hypertensive to 203/136 and mildly tachycardic at the urgent care and was directed to the ED for further evaluation.  Assessment & Plan:   Principal Problem:   Acute CHF (congestive heart failure) (HCC) Active Problems:   Hypertensive urgency   Kidney disease   Elevated troponin   Elevated transaminase level   Normocytic anemia   Renal failure   Acute combined systolic and diastolic CHF  - Pt presents with progressive SOB, b/l ankle swelling, and orthopnea. -2-D echo showed LVEF of 35% and G2DD. -Likely his CHF is secondary to hypertensive heart disease rather than its ischemic cardiomyopathy. -Started initially on  nitroglycerin drip and Lasix, nitroglycerin discontinued. -Continue to follow intake/output, renal function and weight daily. -Currently on Norvasc, Coreg, hydralazine and Imdur. ACEI/ARB not started because of elevated creatinine.  Hypertensive urgency  - BP elevated to 220/150 range initially in setting of acute CHF  - Improved with 20 mg IVP hydralazine, 80 mg IVP Lasix, and NTG infusion in ED  - Volume is likely affecting this, continue diuresis. Discontinue Nitro drip. - Start on Coreg and Imdur, continue Hydralazine and Lasix  Kidney disease of uncertain chronicity  - SCr is 5.18 on admission with no prior available  - Renal US performed with bilateral medical renal disease, no mass or hydro  - Check urine studies, Renal to evaluate  Elevated troponin  - Troponin elevated to 0.23 on admission with no chest pain, ASA added - Likely represents demand ischemia in setting of acute CHF with hypertensive urgency  - Plan to monitor on telemetry for ischemic changes, trend troponin, repeat EKG.  Elevated transaminases  - AST 42 and ALT 126 on admission with normal bilirubin and no abdominal pain  - Likely secondary to CHF and congestive hepatopathy    Tachycardia -Resting tachycardia likely secondary to acute CHF, continue beta blockers and diuresis.   DVT prophylaxis: heparin SQ Code Status: Full Code Family Communication:  Disposition Plan:  Diet: Diet Heart Room service appropriate? Yes; Fluid consistency: Thin; Fluid restriction: 1500 mL Fluid  Consultants:   Nephrology.  Cardiology  Procedures:   None  Antimicrobials:   None  Objective: Vitals:   06/12/16 0525 06/12/16 0550 06/12/16 0758 06/12/16 1120  BP: (!) 196/134 (!) 175/124 Marland Kitchen)  201/96 (!) 192/118  Pulse:   99   Resp:  (!) 23 20   Temp:   98.9 F (37.2 C)   TempSrc:   Oral   SpO2:   98%   Weight:      Height:        Intake/Output Summary (Last 24 hours) at 06/12/16 1150 Last data filed at  06/12/16 1037  Gross per 24 hour  Intake             1060 ml  Output             3900 ml  Net            -2840 ml   Filed Weights   06/10/16 2100 06/11/16 0445 06/12/16 0500  Weight: 127.9 kg (282 lb) 127.7 kg (281 lb 9.6 oz) 126 kg (277 lb 11.2 oz)    Examination: General exam: Appears calm and comfortable  Respiratory system: Clear to auscultation. Respiratory effort normal. Cardiovascular system: S1 & S2 heard, RRR. No JVD, murmurs, rubs, gallops or clicks. No pedal edema. Gastrointestinal system: Abdomen is nondistended, soft and nontender. No organomegaly or masses felt. Normal bowel sounds heard. Central nervous system: Alert and oriented. No focal neurological deficits. Extremities: Symmetric 5 x 5 power. Skin: No rashes, lesions or ulcers Psychiatry: Judgement and insight appear normal. Mood & affect appropriate.   Data Reviewed: I have personally reviewed following labs and imaging studies  CBC:  Recent Labs Lab 06/10/16 1626  WBC 7.3  HGB 11.8*  HCT 35.7*  MCV 91.3  PLT 209   Basic Metabolic Panel:  Recent Labs Lab 06/10/16 1626 06/11/16 0019 06/12/16 0258  NA 138 139 139  K 3.5 3.8 3.5  CL 103 102 97*  CO2 24 23 30   GLUCOSE 117* 171* 124*  BUN 49* 51* 48*  CREATININE 5.18* 5.12* 5.67*  CALCIUM 8.5* 8.6* 8.6*   GFR: Estimated Creatinine Clearance: 24.6 mL/min (A) (by C-G formula based on SCr of 5.67 mg/dL (H)). Liver Function Tests:  Recent Labs Lab 06/10/16 1626  AST 42*  ALT 126*  ALKPHOS 115  BILITOT 1.0  PROT 5.7*  ALBUMIN 3.5   No results for input(s): LIPASE, AMYLASE in the last 168 hours. No results for input(s): AMMONIA in the last 168 hours. Coagulation Profile: No results for input(s): INR, PROTIME in the last 168 hours. Cardiac Enzymes:  Recent Labs Lab 06/10/16 1626 06/10/16 2335 06/11/16 0019 06/11/16 1057  TROPONINI 0.23* 0.20* 0.19* 0.25*   BNP (last 3 results) No results for input(s): PROBNP in the last 8760  hours. HbA1C:  Recent Labs  06/11/16 1057  HGBA1C 4.7*   CBG: No results for input(s): GLUCAP in the last 168 hours. Lipid Profile: No results for input(s): CHOL, HDL, LDLCALC, TRIG, CHOLHDL, LDLDIRECT in the last 72 hours. Thyroid Function Tests: No results for input(s): TSH, T4TOTAL, FREET4, T3FREE, THYROIDAB in the last 72 hours. Anemia Panel: No results for input(s): VITAMINB12, FOLATE, FERRITIN, TIBC, IRON, RETICCTPCT in the last 72 hours. Urine analysis:    Component Value Date/Time   COLORURINE STRAW (A) 06/10/2016 1743   APPEARANCEUR CLEAR 06/10/2016 1743   LABSPEC 1.006 06/10/2016 1743   PHURINE 7.0 06/10/2016 1743   GLUCOSEU NEGATIVE 06/10/2016 1743   HGBUR MODERATE (A) 06/10/2016 1743   BILIRUBINUR NEGATIVE 06/10/2016 1743   KETONESUR NEGATIVE 06/10/2016 1743   PROTEINUR 100 (A) 06/10/2016 1743   NITRITE NEGATIVE 06/10/2016 1743   LEUKOCYTESUR NEGATIVE 06/10/2016 1743   Sepsis Labs: @LABRCNTIP (procalcitonin:4,lacticidven:4)  )  Recent Results (from the past 240 hour(s))  MRSA PCR Screening     Status: None   Collection Time: 06/10/16  9:47 PM  Result Value Ref Range Status   MRSA by PCR NEGATIVE NEGATIVE Final    Comment:        The GeneXpert MRSA Assay (FDA approved for NASAL specimens only), is one component of a comprehensive MRSA colonization surveillance program. It is not intended to diagnose MRSA infection nor to guide or monitor treatment for MRSA infections.      Invalid input(s): PROCALCITONIN, LACTICACIDVEN   Radiology Studies: US Renal  Result Date: 06/10/2016 CLINICAL DATA:  Acute renal failure, history hypertension, smoking EXAM: RENAL / URINARY TRACT ULTRASOUND COMPLETE COMPARISON:  None FINDINGS: Right Kidney: Length: 11.6 cm. Normal cortical thickness. Mildly increased cortical echogenicity. No mass, hydronephrosis or shadowing calcification. Left Kidney: Length: 11.6 cm. Normal cortical thickness. Mildly increased cortical  echogenicity. No mass, hydronephrosis or shadowing calcification. Bladder: Normal appearance IMPRESSION: Medical renal disease changes of both kidneys without evidence of renal mass or hydronephrosis. Electronically Signed   By: Lavonia Dana M.D.   On: 06/10/2016 19:27        Scheduled Meds: . amLODipine  10 mg Oral Daily  . carvedilol  6.25 mg Oral BID WC  . furosemide  40 mg Intravenous Q12H  . heparin  5,000 Units Subcutaneous Q8H  . hydrALAZINE  100 mg Oral Q8H  . isosorbide mononitrate  30 mg Oral Daily  . sodium chloride flush  3 mL Intravenous Q12H   Continuous Infusions:    LOS: 2 days    Time spent: 35 minutes    Siria Calandro A, MD [facility] Pager [phone]  If 7PM-7AM, please contact night-coverage [email or web address] Password [email or web address] 06/12/2016, 11:50 AM

## 2016-06-12 NOTE — Progress Notes (Signed)
Patient ID: Nathan Campbell, male   DOB: December 24, 1975, 41 y.o.   MRN: [id number]   Progress Note  Patient Name: Nathan Campbell Date of Encounter: 06/12/2016  Primary Cardiologist: Croitoru  Subjective   Wants to go home   Inpatient Medications    Scheduled Meds: . carvedilol  6.25 mg Oral BID WC  . furosemide  40 mg Intravenous Q12H  . heparin  5,000 Units Subcutaneous Q8H  . hydrALAZINE  100 mg Oral Q8H  . isosorbide mononitrate  30 mg Oral Daily  . sodium chloride flush  3 mL Intravenous Q12H   Continuous Infusions:  PRN Meds: sodium chloride, acetaminophen, ALPRAZolam, hydrALAZINE, labetalol, ondansetron (ZOFRAN) IV, sodium chloride flush, zolpidem   Vital Signs    Vitals:   06/12/16 0500 06/12/16 0525 06/12/16 0550 06/12/16 0758  BP:  (!) 196/134 (!) 175/124 (!) 201/96  Pulse: (!) 103   99  Resp:   (!) 23 20  Temp: 99.7 F (37.6 C)   98.9 F (37.2 C)  TempSrc: Oral   Oral  SpO2: 98%   98%  Weight: 277 lb 11.2 oz (126 kg)     Height:        Intake/Output Summary (Last 24 hours) at 06/12/16 1029 Last data filed at 06/12/16 0759  Gross per 24 hour  Intake          1050.91 ml  Output             2500 ml  Net         -1449.09 ml   Filed Weights   06/10/16 2100 06/11/16 0445 06/12/16 0500  Weight: 282 lb (127.9 kg) 281 lb 9.6 oz (127.7 kg) 277 lb 11.2 oz (126 kg)    Telemetry    .NSR 06/12/2016 - Personally Reviewed  ECG    SR LVH LAE  - Personally Reviewed  Physical Exam  Black male in no distress  GEN: No acute distress.   Neck: No JVD Cardiac: RRR, no murmurs, rubs, or gallops.  Respiratory: Clear to auscultation bilaterally. GI: Soft, nontender, non-distended  MS: No edema; No deformity. Neuro:  Nonfocal  Psych: Normal affect   Labs    Chemistry Recent Labs Lab 06/10/16 1626 06/11/16 0019 06/12/16 0258  NA 138 139 139  K 3.5 3.8 3.5  CL 103 102 97*  CO2 24 23 30   GLUCOSE 117* 171* 124*  BUN 49* 51* 48*  CREATININE 5.18* 5.12*  5.67*  CALCIUM 8.5* 8.6* 8.6*  PROT 5.7*  --   --   ALBUMIN 3.5  --   --   AST 42*  --   --   ALT 126*  --   --   ALKPHOS 115  --   --   BILITOT 1.0  --   --   GFRNONAA 13* 13* 12*  GFRAA 15* 15* 13*  ANIONGAP 11 14 12      Hematology Recent Labs Lab 06/10/16 1626  WBC 7.3  RBC 3.91*  HGB 11.8*  HCT 35.7*  MCV 91.3  MCH 30.2  MCHC 33.1  RDW 14.7  PLT 170    Cardiac Enzymes Recent Labs Lab 06/10/16 1626 06/10/16 2335 06/11/16 0019 06/11/16 1057  TROPONINI 0.23* 0.20* 0.19* 0.25*   No results for input(s): TROPIPOC in the last 168 hours.   BNP Recent Labs Lab 06/10/16 1626  BNP 1,211.1*     DDimer No results for input(s): DDIMER in the last 168 hours.   Radiology    Dg Chest 2  View  Result Date: 06/10/2016 CLINICAL DATA:  Shortness of Breath EXAM: CHEST  2 VIEW COMPARISON:  None. FINDINGS: Cardiac shadow is enlarged. The lungs are well aerated bilaterally. No focal infiltrate or sizable effusion is seen. No bony abnormality is noted. IMPRESSION: No acute abnormality noted. Electronically Signed   By: Inez Catalina M.D.   On: 06/10/2016 11:50   US Renal  Result Date: 06/10/2016 CLINICAL DATA:  Acute renal failure, history hypertension, smoking EXAM: RENAL / URINARY TRACT ULTRASOUND COMPLETE COMPARISON:  None FINDINGS: Right Kidney: Length: 11.6 cm. Normal cortical thickness. Mildly increased cortical echogenicity. No mass, hydronephrosis or shadowing calcification. Left Kidney: Length: 11.6 cm. Normal cortical thickness. Mildly increased cortical echogenicity. No mass, hydronephrosis or shadowing calcification. Bladder: Normal appearance IMPRESSION: Medical renal disease changes of both kidneys without evidence of renal mass or hydronephrosis. Electronically Signed   By: Lavonia Dana M.D.   On: 06/10/2016 19:27    Cardiac Studies   Echo : severe LVH diffuse hypokinesis grade 2 diastolic EF 16%   Patient Profile     41 y.o. male htn urgency poorly controlled  BP admitted with renal failure  Assessment & Plan    HTN;  Add norvasc continue beta blocker nitrates/hydralazine related to renovascular dx DCM:  Likely non ischemic given no ECG changes and age. Continue medical Rx  CRF:  Will need lab work and close f/u US with no hydronephrosis  Low protein diet  Will arrange outpatient f/u with Dr Loletha Grayer  Signed, Jenkins Rouge, MD  06/12/2016, 10:29 AM

## 2016-06-12 NOTE — Progress Notes (Signed)
[facility] Rounding Note  Subjective/Interval History:  No new events BP still quite elevated Persistent resting tachycardia  Objective Vital signs in last 24 hours: Vitals:   06/12/16 0758 06/12/16 1120 06/12/16 1215 06/12/16 1243  BP: (!) 201/96 (!) 192/118  (!) 190/127  Pulse: 99  97   Resp: 20  18   Temp: 98.9 F (37.2 C)  98.6 F (37 C)   TempSrc: Oral  Oral   SpO2: 98%  99%   Weight:      Height:       Weight change: -1.95 kg (-4 lb 4.8 oz)  Intake/Output Summary (Last 24 hours) at 06/12/16 1409 Last data filed at 06/12/16 1037  Gross per 24 hour  Intake              840 ml  Output             3700 ml  Net            -2860 ml   Physical Exam:  Blood pressure (!) 190/127, pulse 97, temperature 98.6 F (37 C), temperature source Oral, resp. rate 18, height 6\' 1"  (1.854 m), weight 126 kg (277 lb 11.2 oz), SpO2 99 %.  Gen: large framed youn AAM VS as noted Skin: no rash, cyanosis Neck: no JVD, no bruits or LAN Chest: Grossly clear Tachy with summation gallop Abdomen: soft, not distended. No abd bruits Ext: 1-2+ pitting edema both LE's Neuro: alert, Ox3, no focal deficit Skin with tattoos  Recent Labs Lab 06/10/16 1626 06/11/16 0019 06/12/16 0258  NA 138 139 139  K 3.5 3.8 3.5  CL 103 102 97*  CO2 24 23 30   GLUCOSE 117* 171* 124*  BUN 49* 51* 48*  CREATININE 5.18* 5.12* 5.67*  CALCIUM 8.5* 8.6* 8.6*    Recent Labs Lab 06/10/16 1626  AST 42*  ALT 126*  ALKPHOS 115  BILITOT 1.0  PROT 5.7*  ALBUMIN 3.5    Recent Labs Lab 06/10/16 1626  WBC 7.3  HGB 11.8*  HCT 35.7*  MCV 91.3  PLT 170    Recent Labs Lab 06/10/16 1626 06/10/16 2335 06/11/16 0019 06/11/16 1057  TROPONINI 0.23* 0.20* 0.19* 0.25*   Studies/Results: US Renal  Result Date: 06/10/2016 CLINICAL DATA:  Acute renal failure, history hypertension, smoking EXAM: RENAL / URINARY TRACT ULTRASOUND COMPLETE COMPARISON:  None FINDINGS: Right Kidney: Length: 11.6 cm. Normal cortical  thickness. Mildly increased cortical echogenicity. No mass, hydronephrosis or shadowing calcification. Left Kidney: Length: 11.6 cm. Normal cortical thickness. Mildly increased cortical echogenicity. No mass, hydronephrosis or shadowing calcification. Bladder: Normal appearance IMPRESSION: Medical renal disease changes of both kidneys without evidence of renal mass or hydronephrosis. Electronically Signed   By: Lavonia Dana M.D.   On: 06/10/2016 19:27   Medications:  . amLODipine  10 mg Oral Daily  . carvedilol  6.25 mg Oral BID WC  . furosemide  40 mg Intravenous Q12H  . heparin  5,000 Units Subcutaneous Q8H  . hydrALAZINE  100 mg Oral Q8H  . isosorbide mononitrate  30 mg Oral Daily  . sodium chloride flush  3 mL Intravenous Q12H   Background: 41 y.o. year-old male with PMH sig for PSA (including recent cocaine), HTN (has not had primary MD or taken any medicines for years) who went to Urgent Care with c/o SOB, leg swelling - severely hypertensive and sent to [facility]. BP as high as 250/150, neg CXR, creatinine of 5.18 (no prior creatinines for comparison), mild anemia (11.8), elevated  transaminases, elevated BNP. ECHO EF 35%, Grade 2 DD. Getting lasix, diuresing well on relatively small doses given his creatinine (40-80 mg). From renal standpoint workup: renal ultrasound with 11.6 cm kidneys bilaterally, mild increase echogenicity.  Urinalysis 100 mg% protein, 6-30 RBC's. Calculated protein:creatinine ratio 2.58 grams. We are asked to see  Assessment/Recommendations  1. CKD 4/5 - unknown if acute or chronic. Kidney sizes preserved. Echogenicity only mildly increased. Proteinuria heavy, near nephrotic. If renal disease was all related to HTN would expect small kidneys. Proteinuria CAN be heavy with accelerated HTN so that piece doesn't help sort out. Needs at least a serologic workup including ANA, complements, hepatitis serologies (ordered) . HIV negative. Once BP controlled, will consider diagnostic  renal biopsy. Creatinine rising - expect this as BP is brought down. 2. Hypertensive urgency - longstanding untreated HTN. Cardiology and primary service managing meds. Amlodipine added today. Avoid ACE/ARB with this advanced kidney disease. Agree with current lasix. With ongoing tachycardia would seem reasonable to increase carvedilol 0.35->00.9. 3. Acute systolic and diastolic HF - EF 38% by ECHO - per cards 4. Mild anemia - no ESA need at this time 5. CKD-MBD - PTH pending.   Jamal Maes, MD [facility] [phone] pager 06/12/2016, 2:09 PM

## 2016-06-13 DIAGNOSIS — I5041 Acute combined systolic (congestive) and diastolic (congestive) heart failure: Secondary | ICD-10-CM

## 2016-06-13 LAB — RENAL FUNCTION PANEL
ALBUMIN: 3.5 g/dL (ref 3.5–5.0)
ANION GAP: 13 (ref 5–15)
BUN: 50 mg/dL — ABNORMAL HIGH (ref 6–20)
CHLORIDE: 93 mmol/L — AB (ref 101–111)
CO2: 28 mmol/L (ref 22–32)
Calcium: 8.8 mg/dL — ABNORMAL LOW (ref 8.9–10.3)
Creatinine, Ser: 5.88 mg/dL — ABNORMAL HIGH (ref 0.61–1.24)
GFR calc Af Amer: 13 mL/min — ABNORMAL LOW (ref >60)
GFR, EST NON AFRICAN AMERICAN: 11 mL/min — AB (ref >60)
Glucose, Bld: 154 mg/dL — ABNORMAL HIGH (ref 65–99)
PHOSPHORUS: 5.8 mg/dL — AB (ref 2.5–4.6)
POTASSIUM: 3.5 mmol/L (ref 3.5–5.1)
Sodium: 134 mmol/L — ABNORMAL LOW (ref 135–145)

## 2016-06-13 LAB — C4 COMPLEMENT: Complement C4, Body Fluid: 26 mg/dL (ref 14–44)

## 2016-06-13 LAB — HEPATITIS C ANTIBODY (REFLEX)

## 2016-06-13 LAB — C3 COMPLEMENT: C3 Complement: 138 mg/dL (ref 82–167)

## 2016-06-13 LAB — HEPATITIS B SURFACE ANTIGEN: HEP B S AG: NEGATIVE

## 2016-06-13 LAB — HCV COMMENT:

## 2016-06-13 MED ORDER — CARVEDILOL 25 MG PO TABS
25.0000 mg | ORAL_TABLET | Freq: Two times a day (BID) | ORAL | Status: DC
  Administered 2016-06-13 – 2016-06-15 (×4): 25 mg via ORAL
  Filled 2016-06-13 (×4): qty 1

## 2016-06-13 MED ORDER — CARVEDILOL 25 MG PO TABS: 25.0000 mg | ORAL_TABLET | Freq: Two times a day (BID) | ORAL | Status: DC

## 2016-06-13 NOTE — Progress Notes (Signed)
PROGRESS NOTE  Nathan Campbell  VEL:[id number] DOB: 27-Sep-1975 DOA: 06/10/2016 PCP: No PCP Per Patient Outpatient Specialists:  Subjective: Feels better less lower extremity swelling, shortness of breath resolved. Blood pressure still not optimally controlled.  Brief Narrative:  Nathan Campbell is a 41 y.o. male with medical history significant for polysubstance abuse in early remission, and has been told he had high blood pressure, though with no PCP for many years and no medications, now presenting from urgent care where he was seen for progressive dyspnea, orthopnea, and bilateral lower extremity edema.  Patient reports that he is in his usual state until approximately 2 weeks ago when he noted the insidious development of exertional dyspnea and orthopnea. He also was noted to have ankle swelling develop around that same time. Over the ensuing 2 weeks, the exertional dyspnea, swelling, and orthopnea all worsened. There is been no fevers or chills over this interval and no significant chest pain or palpitations. Patient endorses a history of cocaine abuse, reporting his last used 2 of been more than a month ago. He also reports that he quit smoking 2 weeks ago with the onset of this current symptoms and has not been drinking any alcohol and several weeks. He was noted to be hypertensive to 203/136 and mildly tachycardic at the urgent care and was directed to the ED for further evaluation.  Assessment & Plan:   Principal Problem:   Acute CHF (congestive heart failure) (HCC) Active Problems:   Hypertensive urgency   Kidney disease   Elevated troponin   Elevated transaminase level   Normocytic anemia   Renal failure   Acute combined systolic and diastolic CHF  - Pt presents with progressive SOB, b/l ankle swelling, and orthopnea. -2-D echo showed LVEF of 35% and G2DD. -Likely his CHF is secondary to hypertensive heart disease rather than its ischemic cardiomyopathy. -Started initially on  nitroglycerin drip and Lasix, nitroglycerin discontinued. -Continue to follow intake/output, renal function and weight daily. -Currently on Norvasc, Coreg, hydralazine and Imdur. ACEI/ARB not started because of elevated creatinine. -Coreg increased to 25 mg twice a day, continue diuresis.  Hypertensive urgency  - BP elevated to 220/150 range initially in setting of acute CHF  - Improved with 20 mg IVP hydralazine, 80 mg IVP Lasix, and NTG infusion in ED  - Volume is likely affecting this, continue diuresis. Discontinue Nitro drip. - Start on Coreg and Imdur, continue Hydralazine and Lasix, continue current regimen.  Kidney disease of uncertain chronicity  - SCr is 5.18 on admission with no prior available  - Renal US performed with bilateral medical renal disease, no mass or hydro  - Appreciate nephrology's help, she was without significant medical renal disease,? Need biopsy.  Elevated troponin  - Troponin elevated to 0.23 on admission with no chest pain, ASA added - Likely represents demand ischemia in setting of acute CHF with hypertensive urgency  - Plan to monitor on telemetry for ischemic changes, trend troponin, repeat EKG.  Elevated transaminases  - AST 42 and ALT 126 on admission with normal bilirubin and no abdominal pain  - Likely secondary to CHF and congestive hepatopathy    Tachycardia -Resting tachycardia likely secondary to acute CHF, continue beta blockers and diuresis.   DVT prophylaxis: heparin SQ Code Status: Full Code Family Communication:  Disposition Plan:  Diet: Diet Heart Room service appropriate? Yes; Fluid consistency: Thin; Fluid restriction: 1500 mL Fluid  Consultants:   Nephrology.  Cardiology  Procedures:   None  Antimicrobials:   None  Objective: Vitals:   06/13/16 0806 06/13/16 0934 06/13/16 1005 06/13/16 1108  BP: (!) 179/119 121/64 126/61   Pulse: (!) 103     Resp:  (!) 31 (!) 30   Temp:    97.6 F (36.4 C)  TempSrc:     Axillary  SpO2:      Weight:      Height:        Intake/Output Summary (Last 24 hours) at 06/13/16 1242 Last data filed at 06/13/16 0915  Gross per 24 hour  Intake              600 ml  Output             3500 ml  Net            -2900 ml   Filed Weights   06/11/16 0445 06/12/16 0500 06/13/16 0540  Weight: 127.7 kg (281 lb 9.6 oz) 126 kg (277 lb 11.2 oz) 124.5 kg (274 lb 8 oz)    Examination: General exam: Appears calm and comfortable  Respiratory system: Clear to auscultation. Respiratory effort normal. Cardiovascular system: S1 & S2 heard, RRR. No JVD, murmurs, rubs, gallops or clicks. No pedal edema. Gastrointestinal system: Abdomen is nondistended, soft and nontender. No organomegaly or masses felt. Normal bowel sounds heard. Central nervous system: Alert and oriented. No focal neurological deficits. Extremities: Symmetric 5 x 5 power. Skin: No rashes, lesions or ulcers Psychiatry: Judgement and insight appear normal. Mood & affect appropriate.   Data Reviewed: I have personally reviewed following labs and imaging studies  CBC:  Recent Labs Lab 06/10/16 1626  WBC 7.3  HGB 11.8*  HCT 35.7*  MCV 91.3  PLT 967   Basic Metabolic Panel:  Recent Labs Lab 06/10/16 1626 06/11/16 0019 06/12/16 0258 06/13/16 0243  NA 138 139 139 134*  K 3.5 3.8 3.5 3.5  CL 103 102 97* 93*  CO2 24 23 30 28   GLUCOSE 117* 171* 124* 154*  BUN 49* 51* 48* 50*  CREATININE 5.18* 5.12* 5.67* 5.88*  CALCIUM 8.5* 8.6* 8.6* 8.8*  PHOS  --   --   --  5.8*   GFR: Estimated Creatinine Clearance: 23.5 mL/min (A) (by C-G formula based on SCr of 5.88 mg/dL (H)). Liver Function Tests:  Recent Labs Lab 06/10/16 1626 06/13/16 0243  AST 42*  --   ALT 126*  --   ALKPHOS 115  --   BILITOT 1.0  --   PROT 5.7*  --   ALBUMIN 3.5 3.5   No results for input(s): LIPASE, AMYLASE in the last 168 hours. No results for input(s): AMMONIA in the last 168 hours. Coagulation Profile: No results for  input(s): INR, PROTIME in the last 168 hours. Cardiac Enzymes:  Recent Labs Lab 06/10/16 1626 06/10/16 2335 06/11/16 0019 06/11/16 1057  TROPONINI 0.23* 0.20* 0.19* 0.25*   BNP (last 3 results) No results for input(s): PROBNP in the last 8760 hours. HbA1C:  Recent Labs  06/11/16 1057  HGBA1C 4.7*   CBG: No results for input(s): GLUCAP in the last 168 hours. Lipid Profile: No results for input(s): CHOL, HDL, LDLCALC, TRIG, CHOLHDL, LDLDIRECT in the last 72 hours. Thyroid Function Tests: No results for input(s): TSH, T4TOTAL, FREET4, T3FREE, THYROIDAB in the last 72 hours. Anemia Panel: No results for input(s): VITAMINB12, FOLATE, FERRITIN, TIBC, IRON, RETICCTPCT in the last 72 hours. Urine analysis:    Component Value Date/Time   COLORURINE STRAW (A) 06/10/2016 [place] 06/10/2016 1743  LABSPEC 1.006 06/10/2016 1743   PHURINE 7.0 06/10/2016 1743   GLUCOSEU NEGATIVE 06/10/2016 1743   HGBUR MODERATE (A) 06/10/2016 1743   BILIRUBINUR NEGATIVE 06/10/2016 1743   KETONESUR NEGATIVE 06/10/2016 1743   PROTEINUR 100 (A) 06/10/2016 1743   NITRITE NEGATIVE 06/10/2016 1743   LEUKOCYTESUR NEGATIVE 06/10/2016 1743   Sepsis Labs: @LABRCNTIP (procalcitonin:4,lacticidven:4)  ) Recent Results (from the past 240 hour(s))  MRSA PCR Screening     Status: None   Collection Time: 06/10/16  9:47 PM  Result Value Ref Range Status   MRSA by PCR NEGATIVE NEGATIVE Final    Comment:        The GeneXpert MRSA Assay (FDA approved for NASAL specimens only), is one component of a comprehensive MRSA colonization surveillance program. It is not intended to diagnose MRSA infection nor to guide or monitor treatment for MRSA infections.      Invalid input(s): PROCALCITONIN, [place]   Radiology Studies: No results found.      Scheduled Meds: . amLODipine  10 mg Oral Daily  . carvedilol  25 mg Oral BID WC  . furosemide  40 mg Intravenous Q12H  . heparin   5,000 Units Subcutaneous Q8H  . hydrALAZINE  100 mg Oral Q8H  . isosorbide mononitrate  30 mg Oral Daily  . sodium chloride flush  3 mL Intravenous Q12H   Continuous Infusions:    LOS: 3 days    Time spent: 35 minutes    Odette Watanabe A, MD [facility] Pager [phone]  If 7PM-7AM, please contact night-coverage [email or web address] Password [email or web address] 06/13/2016, 12:42 PM

## 2016-06-13 NOTE — Progress Notes (Signed)
[facility] Rounding Note  Subjective/Interval History:  No new events Slow improvement BP Diuresing well  Objective Vital signs in last 24 hours: Vitals:   06/13/16 0753 06/13/16 0806 06/13/16 0934 06/13/16 1005  BP:  (!) 179/119 121/64 126/61  Pulse:  (!) 103    Resp: (!) 22  (!) 31 (!) 30  Temp: 99 F (37.2 C)     TempSrc: Oral     SpO2:      Weight:      Height:       Weight change: -1.452 kg (-3 lb 3.2 oz)  Intake/Output Summary (Last 24 hours) at 06/13/16 1058 Last data filed at 06/13/16 0915  Gross per 24 hour  Intake              600 ml  Output             3500 ml  Net            -2900 ml   Physical Exam:  Blood pressure 126/61, pulse (!) 103, temperature 99 F (37.2 C), temperature source Oral, resp. rate (!) 30, height 6\' 1"  (1.854 m), weight 124.5 kg (274 lb 8 oz), SpO2 96 %.  Large framed young AAM VS as noted Skin: no rash, cyanosis Neck: no JVD Clear lung fields Tachy with loud summation gallop Abdomen: soft, not distended. No abd bruits Ext: 1+ pitting edema both LE's markedly better Alert, Ox3, no focal deficit Skin with tattoos  Recent Labs Lab 06/10/16 1626 06/11/16 0019 06/12/16 0258 06/13/16 0243  NA 138 139 139 134*  K 3.5 3.8 3.5 3.5  CL 103 102 97* 93*  CO2 24 23 30 28   GLUCOSE 117* 171* 124* 154*  BUN 49* 51* 48* 50*  CREATININE 5.18* 5.12* 5.67* 5.88*  CALCIUM 8.5* 8.6* 8.6* 8.8*  PHOS  --   --   --  5.8*    Recent Labs Lab 06/10/16 1626 06/13/16 0243  AST 42*  --   ALT 126*  --   ALKPHOS 115  --   BILITOT 1.0  --   PROT 5.7*  --   ALBUMIN 3.5 3.5    Recent Labs Lab 06/10/16 1626  WBC 7.3  HGB 11.8*  HCT 35.7*  MCV 91.3  PLT 170    Recent Labs Lab 06/10/16 1626 06/10/16 2335 06/11/16 0019 06/11/16 1057  TROPONINI 0.23* 0.20* 0.19* 0.25*   Studies/Results: Dg Chest 2 View  Result Date: 06/10/2016 CLINICAL DATA:  Shortness of Breath EXAM: CHEST  2 VIEW COMPARISON:  None. FINDINGS: Cardiac shadow is enlarged.  The lungs are well aerated bilaterally. No focal infiltrate or sizable effusion is seen. No bony abnormality is noted. IMPRESSION: No acute abnormality noted. Electronically Signed   By: Inez Catalina M.D.   On: 06/10/2016 11:50   US Renal  Result Date: 06/10/2016 CLINICAL DATA:  Acute renal failure, history hypertension, smoking EXAM: RENAL / URINARY TRACT ULTRASOUND COMPLETE COMPARISON:  None FINDINGS: Right Kidney: Length: 11.6 cm. Normal cortical thickness. Mildly increased cortical echogenicity. No mass, hydronephrosis or shadowing calcification. Left Kidney: Length: 11.6 cm. Normal cortical thickness. Mildly increased cortical echogenicity. No mass, hydronephrosis or shadowing calcification. Bladder: Normal appearance IMPRESSION: Medical renal disease changes of both kidneys without evidence of renal mass or hydronephrosis. Electronically Signed   By: Lavonia Dana M.D.   On: 06/10/2016 19:27   Medications:  . amLODipine  10 mg Oral Daily  . carvedilol  25 mg Oral BID WC  . furosemide  40 mg Intravenous Q12H  . heparin  5,000 Units Subcutaneous Q8H  . hydrALAZINE  100 mg Oral Q8H  . isosorbide mononitrate  30 mg Oral Daily  . sodium chloride flush  3 mL Intravenous Q12H   Background: 41 y.o. year-old male with PMH sig for PSA (including recent cocaine), HTN "as long as could remember" (has not had primary MD or taken any medicines for years) Went to Urgent Care with SOB, leg swelling - severely hypertensive and sent to [facility]. BP as high as 250/150, neg CXR, creatinine of 5.18 (no prior creatinines for comparison), mild anemia (11.8), elevated transaminases, elevated BNP. ECHO EF 35%, Grade 2 DD. Getting lasix, diuresing well on relatively small doses given his creatinine (40-80 mg). From renal standpoint workup: renal ultrasound with 11.6 cm kidneys bilaterally, mild increase echogenicity.  Urinalysis 100 mg% protein, 6-30 RBC's. Calculated protein:creatinine ratio 2.58 grams. We are asked to  see  Assessment/Recommendations  1. CKD 4/5 - unknown if acute or chronic. Kidney sizes preserved. Echogenicity only mildly increased. Proteinuria heavy, near nephrotic range. If renal disease was all related to HTN would expect small kidneys. Proteinuria CAN be heavy with accelerated HTN so that piece doesn't help sort out. Needs at least a serologic workup. HIV negative. Hep B and C neg. Complements, ANA, ANCA, dsDNA all pending. Once BP controlled, will consider if diagnostic renal biopsy appropriate. Creatinine slowly rising - expect this as BP is brought down. 2. Hypertensive urgency - longstanding untreated HTN. Diuresing with lasix 40 IV A12H. Current program carvedilol 25 BID (just increased again today), amlodipine 10, hydralazine 100 TID.  3. Acute systolic and diastolic HF - EF 69% by ECHO - per cards 4. Mild anemia - no ESA need at this time 5. CKD-MBD - PTH ordered.   Nathan Maes, MD [facility] [phone] pager 06/13/2016, 10:58 AM

## 2016-06-13 NOTE — Progress Notes (Signed)
Patient ID: Nathan Campbell, male   DOB: 02/24/1976, 41 y.o.   MRN: [id number]   Progress Note  Patient Name: Nathan Campbell Date of Encounter: 06/13/2016  Primary Cardiologist: Croitoru  Subjective   BP slowly improving weight down good diuresis   Inpatient Medications    Scheduled Meds: . amLODipine  10 mg Oral Daily  . carvedilol  12.5 mg Oral BID WC  . furosemide  40 mg Intravenous Q12H  . heparin  5,000 Units Subcutaneous Q8H  . hydrALAZINE  100 mg Oral Q8H  . isosorbide mononitrate  30 mg Oral Daily  . sodium chloride flush  3 mL Intravenous Q12H   Continuous Infusions:  PRN Meds: sodium chloride, acetaminophen, ALPRAZolam, hydrALAZINE, labetalol, ondansetron (ZOFRAN) IV, sodium chloride flush, zolpidem   Vital Signs    Vitals:   06/13/16 0200 06/13/16 0415 06/13/16 0540 06/13/16 0753  BP: (!) 173/108 (!) 161/101    Pulse:  100    Resp: 14 (!) 30  (!) 22  Temp:  99.4 F (37.4 C)  99 F (37.2 C)  TempSrc:  Oral  Oral  SpO2:  96%    Weight:   274 lb 8 oz (124.5 kg)   Height:        Intake/Output Summary (Last 24 hours) at 06/13/16 0805 Last data filed at 06/13/16 0500  Gross per 24 hour  Intake              720 ml  Output             4200 ml  Net            -3480 ml   Filed Weights   06/11/16 0445 06/12/16 0500 06/13/16 0540  Weight: 281 lb 9.6 oz (127.7 kg) 277 lb 11.2 oz (126 kg) 274 lb 8 oz (124.5 kg)    Telemetry    .NSR 06/13/2016 - Personally Reviewed  ECG    SR LVH LAE  - Personally Reviewed  Physical Exam  Black male in no distress  GEN: No acute distress.   Neck: No JVD Cardiac: RRR, no murmurs, rubs, or gallops.  Respiratory: Clear to auscultation bilaterally. GI: Soft, nontender, non-distended  MS: No edema; No deformity. Neuro:  Nonfocal  Psych: Normal affect   Labs    Chemistry  Recent Labs Lab 06/10/16 1626 06/11/16 0019 06/12/16 0258 06/13/16 0243  NA 138 139 139 134*  K 3.5 3.8 3.5 3.5  CL 103 102 97* 93*  CO2  24 23 30 28   GLUCOSE 117* 171* 124* 154*  BUN 49* 51* 48* 50*  CREATININE 5.18* 5.12* 5.67* 5.88*  CALCIUM 8.5* 8.6* 8.6* 8.8*  PROT 5.7*  --   --   --   ALBUMIN 3.5  --   --  3.5  AST 42*  --   --   --   ALT 126*  --   --   --   ALKPHOS 115  --   --   --   BILITOT 1.0  --   --   --   GFRNONAA 13* 13* 12* 11*  GFRAA 15* 15* 13* 13*  ANIONGAP 11 14 12 13      Hematology  Recent Labs Lab 06/10/16 1626  WBC 7.3  RBC 3.91*  HGB 11.8*  HCT 35.7*  MCV 91.3  MCH 30.2  MCHC 33.1  RDW 14.7  PLT 170    Cardiac Enzymes  Recent Labs Lab 06/10/16 1626 06/10/16 2335 06/11/16 0019 06/11/16 1057  TROPONINI  0.23* 0.20* 0.19* 0.25*   No results for input(s): TROPIPOC in the last 168 hours.   BNP  Recent Labs Lab 06/10/16 1626  BNP 1,211.1*     DDimer No results for input(s): DDIMER in the last 168 hours.   Radiology    No results found.  Cardiac Studies   Echo : severe LVH diffuse hypokinesis grade 2 diastolic EF 46%   Patient Profile     41 y.o. male htn urgency poorly controlled BP admitted with renal failure  Assessment & Plan    HTN;  Improving with norvasc and increased dose coreg  DCM:  Likely non ischemic given no ECG changes and age. Continue medical Rx  CRF:  Will need lab work and close f/u US with no hydronephrosis  Low protein diet Note Dr Sanda Klein concern for nephrotic syndrome and need for biopsy when BP ok   Will arrange outpatient f/u with Dr Loletha Grayer  Signed, Jenkins Rouge, MD  06/13/2016, 8:05 AM

## 2016-06-14 ENCOUNTER — Inpatient Hospital Stay (HOSPITAL_COMMUNITY): Payer: BLUE CROSS/BLUE SHIELD

## 2016-06-14 ENCOUNTER — Encounter (HOSPITAL_COMMUNITY): Payer: Self-pay | Admitting: Physician Assistant

## 2016-06-14 DIAGNOSIS — I429 Cardiomyopathy, unspecified: Secondary | ICD-10-CM

## 2016-06-14 DIAGNOSIS — I110 Hypertensive heart disease with heart failure: Secondary | ICD-10-CM

## 2016-06-14 DIAGNOSIS — I428 Other cardiomyopathies: Secondary | ICD-10-CM

## 2016-06-14 DIAGNOSIS — I130 Hypertensive heart and chronic kidney disease with heart failure and stage 1 through stage 4 chronic kidney disease, or unspecified chronic kidney disease: Secondary | ICD-10-CM

## 2016-06-14 DIAGNOSIS — I5040 Unspecified combined systolic (congestive) and diastolic (congestive) heart failure: Secondary | ICD-10-CM | POA: Diagnosis present

## 2016-06-14 HISTORY — DX: Cardiomyopathy, unspecified: I42.9

## 2016-06-14 HISTORY — DX: Hypertensive heart and chronic kidney disease with heart failure and stage 1 through stage 4 chronic kidney disease, or unspecified chronic kidney disease: I13.0

## 2016-06-14 LAB — ANTI-DNA ANTIBODY, DOUBLE-STRANDED: ds DNA Ab: 1 IU/mL (ref 0–9)

## 2016-06-14 LAB — RENAL FUNCTION PANEL
ALBUMIN: 3.4 g/dL — AB (ref 3.5–5.0)
ANION GAP: 12 (ref 5–15)
BUN: 55 mg/dL — ABNORMAL HIGH (ref 6–20)
CALCIUM: 8.9 mg/dL (ref 8.9–10.3)
CHLORIDE: 95 mmol/L — AB (ref 101–111)
CO2: 30 mmol/L (ref 22–32)
CREATININE: 5.87 mg/dL — AB (ref 0.61–1.24)
GFR calc Af Amer: 13 mL/min — ABNORMAL LOW (ref >60)
GFR calc non Af Amer: 11 mL/min — ABNORMAL LOW (ref >60)
GLUCOSE: 135 mg/dL — AB (ref 65–99)
PHOSPHORUS: 5.6 mg/dL — AB (ref 2.5–4.6)
POTASSIUM: 3.7 mmol/L (ref 3.5–5.1)
Sodium: 137 mmol/L (ref 135–145)

## 2016-06-14 LAB — URINALYSIS, ROUTINE W REFLEX MICROSCOPIC
BILIRUBIN URINE: NEGATIVE
GLUCOSE, UA: NEGATIVE mg/dL
HGB URINE DIPSTICK: NEGATIVE
Ketones, ur: NEGATIVE mg/dL
Leukocytes, UA: NEGATIVE
NITRITE: NEGATIVE
PH: 6 (ref 5.0–8.0)
Protein, ur: 100 mg/dL — AB
SPECIFIC GRAVITY, URINE: 1.011 (ref 1.005–1.030)

## 2016-06-14 LAB — CBC
HCT: 36.3 % — ABNORMAL LOW (ref 39.0–52.0)
HEMOGLOBIN: 11.6 g/dL — AB (ref 13.0–17.0)
MCH: 30.2 pg (ref 26.0–34.0)
MCHC: 32 g/dL (ref 30.0–36.0)
MCV: 94.5 fL (ref 78.0–100.0)
PLATELETS: 187 10*3/uL (ref 150–400)
RBC: 3.84 MIL/uL — ABNORMAL LOW (ref 4.22–5.81)
RDW: 14.8 % (ref 11.5–15.5)
WBC: 11.1 10*3/uL — ABNORMAL HIGH (ref 4.0–10.5)

## 2016-06-14 LAB — ANTINUCLEAR ANTIBODIES, IFA: ANTINUCLEAR ANTIBODIES, IFA: NEGATIVE

## 2016-06-14 LAB — MPO/PR-3 (ANCA) ANTIBODIES
ANCA Proteinase 3: <3.5 U/mL (ref 0.0–3.5)
Myeloperoxidase Abs: <9 U/mL (ref 0.0–9.0)

## 2016-06-14 MED ORDER — ISOSORBIDE MONONITRATE ER 60 MG PO TB24
60.0000 mg | ORAL_TABLET | Freq: Every day | ORAL | Status: DC
  Administered 2016-06-14 – 2016-06-15 (×2): 60 mg via ORAL
  Filled 2016-06-14 (×3): qty 1

## 2016-06-14 MED ORDER — FUROSEMIDE 80 MG PO TABS
80.0000 mg | ORAL_TABLET | Freq: Two times a day (BID) | ORAL | Status: DC
  Administered 2016-06-14 – 2016-06-15 (×3): 80 mg via ORAL
  Filled 2016-06-14 (×3): qty 1

## 2016-06-14 NOTE — Progress Notes (Signed)
Subjective: Interval History: has no complaint.  Objective: Vital signs in last 24 hours: Temp:  [97.5 F (36.4 C)-100.7 F (38.2 C)] 98.2 F (36.8 C) (04/02 0806) Pulse Rate:  [94-107] 107 (04/02 0739) Resp:  [18-33] 22 (04/02 0503) BP: (126-182)/(61-123) 177/104 (04/02 0739) SpO2:  [98 %-100 %] 100 % (04/02 0503) Weight:  [123.7 kg (272 lb 9.6 oz)] 123.7 kg (272 lb 9.6 oz) (04/02 0503) Weight change: -0.862 kg (-1 lb 14.4 oz)  Intake/Output from previous day: 04/01 0701 - 04/02 0700 In: 1263 [P.O.:1260; I.V.:3] Out: 3950 [Urine:3950] Intake/Output this shift: No intake/output data recorded.  General appearance: sleepy, poorly coop Resp: sleepy, poorly coop Cardio: S1, S2 normal and systolic murmur: holosystolic 2/6, blowing at apex GI: obese, pos bs Extremitie2+edema nothing  Lab Results:  Recent Labs  06/14/16 0334  WBC 11.1*  HGB 11.6*  HCT 36.3*  PLT 187   BMET:  Recent Labs  06/13/16 0243 06/14/16 0334  NA 134* 137  K 3.5 3.7  CL 93* 95*  CO2 28 30  GLUCOSE 154* 135*  BUN 50* 55*  CREATININE 5.88* 5.87*  CALCIUM 8.8* 8.9   No results for input(s): PTH in the last 72 hours. Iron Studies: No results for input(s): IRON, TIBC, TRANSFERRIN, FERRITIN in the last 72 hours.  Studies/Results: Dg Chest 2 View  Result Date: 06/14/2016 CLINICAL DATA:  CHF.  Shortness of breath . EXAM: CHEST  2 VIEW COMPARISON:  06/10/2016. FINDINGS: Cardiomegaly with normal pulmonary vascularity. No focal infiltrate. Low lung volumes. No pleural effusion or pneumothorax. No acute bony abnormality identified. IMPRESSION: Stable cardiomegaly.  No CHF.  Low lung volumes. Electronically Signed   By: Marcello Moores  Register   On: 06/14/2016 08:32    I have reviewed the patient's current medications.  Assessment/Plan: 1 CKD,.  Vol xs GFR stable, low.  Nonnephrotic proteinuria.  Will control bp slowly.  Will use po Lasix 2 HTN slowly better, diruese. 3 HPTH check 4 Anemia P po lasix,  cont other bp meds, can d/c in 1-2 d   LOS: 4 days   Joelys Staubs L 06/14/2016,9:56 AM

## 2016-06-14 NOTE — Progress Notes (Signed)
Progress Note  Patient Name: Nathan Campbell Date of Encounter: 06/14/2016  Primary Cardiologist: Dr Sallyanne Kuster  Patient Profile     41 y.o. male w/ hx HTN, remote cocaine use, recently quit tobacco, was admitted 03/29 for SOB>>CHF and malignant HTN.   Subjective   Sleeping, rouses to loud verbal, then goes back to sleep. No CP/SOB  Inpatient Medications    Scheduled Meds: . amLODipine  10 mg Oral Daily  . carvedilol  25 mg Oral BID WC  . furosemide  40 mg Intravenous Q12H  . heparin  5,000 Units Subcutaneous Q8H  . hydrALAZINE  100 mg Oral Q8H  . isosorbide mononitrate  60 mg Oral Daily  . sodium chloride flush  3 mL Intravenous Q12H   Continuous Infusions:  PRN Meds: sodium chloride, acetaminophen, ALPRAZolam, hydrALAZINE, labetalol, ondansetron (ZOFRAN) IV, sodium chloride flush, zolpidem   Vital Signs    Vitals:   06/14/16 0355 06/14/16 0503 06/14/16 0739 06/14/16 0806  BP: (!) 150/103 (!) 166/91 (!) 177/104   Pulse:  94 (!) 107   Resp: (!) 31 (!) 22    Temp:  98.5 F (36.9 C)  98.2 F (36.8 C)  TempSrc:  Oral  Oral  SpO2:  100%    Weight:  272 lb 9.6 oz (123.7 kg)    Height:        Intake/Output Summary (Last 24 hours) at 06/14/16 0939 Last data filed at 06/14/16 0700  Gross per 24 hour  Intake             1023 ml  Output             3250 ml  Net            -2227 ml   Filed Weights   06/12/16 0500 06/13/16 0540 06/14/16 0503  Weight: 277 lb 11.2 oz (126 kg) 274 lb 8 oz (124.5 kg) 272 lb 9.6 oz (123.7 kg)    Telemetry    SR, ST - Personally Reviewed  ECG    n/a - Personally Reviewed  Physical Exam   General: Well developed, well nourished, male appearing in no acute distress. Head: Normocephalic, atraumatic.  Neck: Supple without bruits, JVD minimal elevation. Lungs:  Resp regular and unlabored, CTA. Heart: RRR, S1, S2, no S3, S4, soft murmur; no rub. Abdomen: Soft, non-tender, non-distended with normoactive bowel sounds. No hepatomegaly.  No rebound/guarding. No obvious abdominal masses. Extremities: No clubbing, cyanosis, no edema. Distal pedal pulses are 2+ bilaterally. Neuro: sleepy but oriented X 3. Moves all extremities spontaneously.  Labs    Hematology  Recent Labs Lab 06/10/16 1626 06/14/16 0334  WBC 7.3 11.1*  RBC 3.91* 3.84*  HGB 11.8* 11.6*  HCT 35.7* 36.3*  MCV 91.3 94.5  MCH 30.2 30.2  MCHC 33.1 32.0  RDW 14.7 14.8  PLT 170 187    Chemistry Recent Labs Lab 06/10/16 1626  06/12/16 0258 06/13/16 0243 06/14/16 0334  NA 138  < > 139 134* 137  K 3.5  < > 3.5 3.5 3.7  CL 103  < > 97* 93* 95*  CO2 24  < > 30 28 30   GLUCOSE 117*  < > 124* 154* 135*  BUN 49*  < > 48* 50* 55*  CREATININE 5.18*  < > 5.67* 5.88* 5.87*  CALCIUM 8.5*  < > 8.6* 8.8* 8.9  PROT 5.7*  --   --   --   --   ALBUMIN 3.5  --   --  3.5  3.4*  AST 42*  --   --   --   --   ALT 126*  --   --   --   --   ALKPHOS 115  --   --   --   --   BILITOT 1.0  --   --   --   --   GFRNONAA 13*  < > 12* 11* 11*  GFRAA 15*  < > 13* 13* 13*  ANIONGAP 11  < > 12 13 12   < > = values in this interval not displayed.   Cardiac Enzymes  Recent Labs Lab 06/10/16 1626 06/10/16 2335 06/11/16 0019 06/11/16 1057  TROPONINI 0.23* 0.20* 0.19* 0.25*    BNP  Recent Labs Lab 06/10/16 1626  BNP 1,211.1*    Drugs of Abuse     Component Value Date/Time   LABOPIA NONE DETECTED 06/10/2016 1743   COCAINSCRNUR NONE DETECTED 06/10/2016 1743   LABBENZ NONE DETECTED 06/10/2016 1743   AMPHETMU NONE DETECTED 06/10/2016 1743   THCU NONE DETECTED 06/10/2016 1743   LABBARB NONE DETECTED 06/10/2016 1743    Radiology    Dg Chest 2 View Result Date: 06/14/2016 CLINICAL DATA:  CHF.  Shortness of breath . EXAM: CHEST  2 VIEW COMPARISON:  06/10/2016. FINDINGS: Cardiomegaly with normal pulmonary vascularity. No focal infiltrate. Low lung volumes. No pleural effusion or pneumothorax. No acute bony abnormality identified. IMPRESSION: Stable cardiomegaly.  No  CHF.  Low lung volumes. Electronically Signed   By: Marcello Moores  Register   On: 06/14/2016 08:32   Dg Chest 2 View Result Date: 06/10/2016 CLINICAL DATA:  Shortness of Breath EXAM: CHEST  2 VIEW COMPARISON:  None. FINDINGS: Cardiac shadow is enlarged. The lungs are well aerated bilaterally. No focal infiltrate or sizable effusion is seen. No bony abnormality is noted. IMPRESSION: No acute abnormality noted. Electronically Signed   By: Inez Catalina M.D.   On: 06/10/2016 11:50   US Renal Result Date: 06/10/2016 CLINICAL DATA:  Acute renal failure, history hypertension, smoking EXAM: RENAL / URINARY TRACT ULTRASOUND COMPLETE COMPARISON:  None FINDINGS: Right Kidney: Length: 11.6 cm. Normal cortical thickness. Mildly increased cortical echogenicity. No mass, hydronephrosis or shadowing calcification. Left Kidney: Length: 11.6 cm. Normal cortical thickness. Mildly increased cortical echogenicity. No mass, hydronephrosis or shadowing calcification. Bladder: Normal appearance IMPRESSION: Medical renal disease changes of both kidneys without evidence of renal mass or hydronephrosis. Electronically Signed   By: Lavonia Dana M.D.   On: 06/10/2016 19:27     Cardiac Studies   ECHO: 06/11/2016 - Left ventricle: The cavity size was normal. There was severe   concentric hypertrophy. Systolic function was moderately to   severely reduced. The estimated ejection fraction was 35%.   Diffuse hypokinesis. Features are consistent with a pseudonormal   left ventricular filling pattern, with concomitant abnormal   relaxation and increased filling pressure (grade 2 diastolic   dysfunction). Doppler parameters are consistent with elevated   ventricular end-diastolic filling pressure. - Aortic valve: Trileaflet; normal thickness leaflets. There was   mild to moderate regurgitation. - Aortic root: The aortic root was normal in size. - Mitral valve: There was mild regurgitation. - Left atrium: The atrium was severely  dilated. - Right ventricle: Systolic function was normal. - Right atrium: The atrium was normal in size. - Pulmonary arteries: Systolic pressure was within the normal range. - Inferior vena cava: The vessel was normal in size. - Pericardium, extracardiac: There was no pericardial effusion. Impressions: - Severely decreased LVEF  estimated at 35%, severe concentric LVH.   Severely decreased global longitudinal strain: -9.2%.   Grade 2 diastolic dysfunction with elevated filling pressures.   Normal RVEF.   Patient Profile     41 y.o. male w/ hx  HTN, remote cocaine use, recently quit tobacco, was admitted 03/29 for SOB>>CHF and malignant HTN.   Assessment & Plan    Principal Problem:   Hypertensive heart and kidney disease with heart failure ([facility]) - he is on high doses of amlodipine, Coreg (increased 04/01), hydralazine. Imdur at 60 mg, MD advise on increasing this.   Active Problems:   Acute combined systolic and diastolic CHF, NYHA class 4 ([facility]) - diuretics per Dr Lorrene Reid    Elevated troponin   Cardiomyopathy - felt most likely NICM due to HTN - on BB, no ACE/ARB due to poor renal function - on hydralazine, nitrates - hopefully EF will improve with rx and better BP control  Otherwise, per IM   Hypertensive urgency   Kidney disease   Elevated transaminase level   Normocytic anemia   Renal failure   Signed, Lenoard Aden 9:39 AM 06/14/2016 Pager: [phone] As above, patient seen and examined. He is lethargic this morning but follows commands without focal neurological findings. He denies dyspnea or chest pain. His blood pressure decreased to 124/70 this morning following his as needed labetalol (would DC). I will hold his amlodipine and isosorbide as low blood pressure may be contributing to his lethargy. Will follow blood pressure and adjust regimen as needed. His blood pressure needs to be controlled but not reduced too quickly to cause neurological symptoms.  His Lasix is being managed by nephrology. Following discharge she will need a follow-up echocardiogram in approximately 3 months. Hopefully his LV function will have improved once blood pressure controlled. Other issues per primary care. Kirk Ruths, MD

## 2016-06-15 DIAGNOSIS — I420 Dilated cardiomyopathy: Secondary | ICD-10-CM

## 2016-06-15 LAB — RENAL FUNCTION PANEL
Albumin: 3.6 g/dL (ref 3.5–5.0)
Anion gap: 17 — ABNORMAL HIGH (ref 5–15)
BUN: 61 mg/dL — AB (ref 6–20)
CHLORIDE: 92 mmol/L — AB (ref 101–111)
CO2: 29 mmol/L (ref 22–32)
CREATININE: 6.07 mg/dL — AB (ref 0.61–1.24)
Calcium: 9.4 mg/dL (ref 8.9–10.3)
GFR calc Af Amer: 12 mL/min — ABNORMAL LOW (ref >60)
GFR calc non Af Amer: 11 mL/min — ABNORMAL LOW (ref >60)
Glucose, Bld: 114 mg/dL — ABNORMAL HIGH (ref 65–99)
Phosphorus: 5.9 mg/dL — ABNORMAL HIGH (ref 2.5–4.6)
Potassium: 4 mmol/L (ref 3.5–5.1)
Sodium: 138 mmol/L (ref 135–145)

## 2016-06-15 LAB — PARATHYROID HORMONE, INTACT (NO CA): PTH: 158 pg/mL — AB (ref 15–65)

## 2016-06-15 MED ORDER — CARVEDILOL 25 MG PO TABS: 25.0000 mg | ORAL_TABLET | Freq: Two times a day (BID) | ORAL | 1 refills | Status: DC

## 2016-06-15 MED ORDER — CALCITRIOL 0.25 MCG PO CAPS: 0.2500 ug | ORAL_CAPSULE | Freq: Every day | ORAL | 0 refills | Status: DC

## 2016-06-15 MED ORDER — AMLODIPINE BESYLATE 10 MG PO TABS: 10.0000 mg | ORAL_TABLET | Freq: Every day | ORAL | 1 refills | Status: DC

## 2016-06-15 MED ORDER — ISOSORBIDE MONONITRATE ER 60 MG PO TB24: 60.0000 mg | ORAL_TABLET | Freq: Every day | ORAL | 1 refills | Status: DC

## 2016-06-15 MED ORDER — FUROSEMIDE 80 MG PO TABS: 80.0000 mg | ORAL_TABLET | Freq: Two times a day (BID) | ORAL | 1 refills | Status: DC

## 2016-06-15 MED ORDER — CALCITRIOL 0.25 MCG PO CAPS
0.2500 ug | ORAL_CAPSULE | Freq: Every day | ORAL | Status: DC
  Administered 2016-06-15: 0.25 ug via ORAL
  Filled 2016-06-15: qty 1

## 2016-06-15 MED ORDER — CARVEDILOL 25 MG PO TABS
25.0000 mg | ORAL_TABLET | Freq: Two times a day (BID) | ORAL | 1 refills | Status: DC
Start: 2016-06-15 — End: 2016-06-15

## 2016-06-15 MED ORDER — HYDRALAZINE HCL 100 MG PO TABS
100.0000 mg | ORAL_TABLET | Freq: Three times a day (TID) | ORAL | 1 refills | Status: DC
Start: 2016-06-15 — End: 2021-09-13

## 2016-06-15 MED ORDER — HYDRALAZINE HCL 100 MG PO TABS: 100.0000 mg | ORAL_TABLET | Freq: Three times a day (TID) | ORAL | 1 refills | Status: DC

## 2016-06-15 NOTE — Progress Notes (Signed)
Work not given to pt.

## 2016-06-15 NOTE — Progress Notes (Signed)
Subjective: Interval History: has complaints got dizzy earlier.  Objective: Vital signs in last 24 hours: Temp:  [98.4 F (36.9 C)-99.9 F (37.7 C)] 98.4 F (36.9 C) (04/03 0805) Pulse Rate:  [89-103] 103 (04/03 0805) Resp:  [18-32] 27 (04/03 0805) BP: (142-177)/(66-113) 142/66 (04/03 0805) SpO2:  [97 %-100 %] 100 % (04/03 0325) Weight:  [122.5 kg (270 lb 1.6 oz)] 122.5 kg (270 lb 1.6 oz) (04/03 0333) Weight change: -1.134 kg (-2 lb 8 oz)  Intake/Output from previous day: 04/02 0701 - 04/03 0700 In: 1065 [P.O.:1062; I.V.:3] Out: 1725 [Urine:1725] Intake/Output this shift: Total I/O In: 120 [P.O.:120] Out: 575 [Urine:575]  General appearance: alert, cooperative, no distress and moderately obese Resp: clear to auscultation bilaterally Cardio: S1, S2 normal, S4 present and systolic murmur: holosystolic 2/6, blowing at apex GI: obese, pos bs, liver down 5 cm Extremities: extremities normal, atraumatic, no cyanosis or edema  Lab Results:  Recent Labs  06/14/16 0334  WBC 11.1*  HGB 11.6*  HCT 36.3*  PLT 187   BMET:  Recent Labs  06/14/16 0334 06/15/16 0449  NA 137 138  K 3.7 4.0  CL 95* 92*  CO2 30 29  GLUCOSE 135* 114*  BUN 55* 61*  CREATININE 5.87* 6.07*  CALCIUM 8.9 9.4    Recent Labs  06/14/16 0953  PTH 158*   Iron Studies: No results for input(s): IRON, TIBC, TRANSFERRIN, FERRITIN in the last 72 hours.  Studies/Results: Dg Chest 2 View  Result Date: 06/14/2016 CLINICAL DATA:  CHF.  Shortness of breath . EXAM: CHEST  2 VIEW COMPARISON:  06/10/2016. FINDINGS: Cardiomegaly with normal pulmonary vascularity. No focal infiltrate. Low lung volumes. No pleural effusion or pneumothorax. No acute bony abnormality identified. IMPRESSION: Stable cardiomegaly.  No CHF.  Low lung volumes. Electronically Signed   By: Marcello Moores  Register   On: 06/14/2016 08:32    I have reviewed the patient's current medications.  Assessment/Plan: 1 CKD5  Ok to d/c will set up  access outpatient 2 HTN control ok for him at this time  3 Anemia  Ok at this time 4 HPTH vit D  P cont current meds, can d/c from my standpoint  LOS: 5 days   Irving Bloor L 06/15/2016,10:35 AM

## 2016-06-15 NOTE — Progress Notes (Signed)
Progress Note  Patient Name: Nathan Campbell Date of Encounter: 06/15/2016  Primary Cardiologist: Dr Sallyanne Kuster  Patient Profile     41 y.o. male w/ hx HTN, remote cocaine use, recently quit tobacco, was admitted 03/29 for SOB>>CHF and malignant HTN.   Subjective   No CP/SOB  Inpatient Medications    Scheduled Meds: . amLODipine  10 mg Oral Daily  . carvedilol  25 mg Oral BID WC  . furosemide  80 mg Oral BID  . heparin  5,000 Units Subcutaneous Q8H  . hydrALAZINE  100 mg Oral Q8H  . isosorbide mononitrate  60 mg Oral Daily  . sodium chloride flush  3 mL Intravenous Q12H   Continuous Infusions:  PRN Meds: sodium chloride, acetaminophen, ALPRAZolam, hydrALAZINE, labetalol, ondansetron (ZOFRAN) IV, sodium chloride flush, zolpidem   Vital Signs    Vitals:   06/15/16 0325 06/15/16 0333 06/15/16 0532 06/15/16 0805  BP: (!) 158/105  (!) 162/83 (!) 142/66  Pulse: (!) 102   (!) 103  Resp: 18   (!) 27  Temp: 98.7 F (37.1 C)   98.4 F (36.9 C)  TempSrc: Oral   Oral  SpO2: 100%     Weight:  270 lb 1.6 oz (122.5 kg)    Height:        Intake/Output Summary (Last 24 hours) at 06/15/16 0813 Last data filed at 06/15/16 0809  Gross per 24 hour  Intake             1065 ml  Output             2300 ml  Net            -1235 ml   Filed Weights   06/13/16 0540 06/14/16 0503 06/15/16 0333  Weight: 274 lb 8 oz (124.5 kg) 272 lb 9.6 oz (123.7 kg) 270 lb 1.6 oz (122.5 kg)    Telemetry    SR, ST - Personally Reviewed  Physical Exam   General: Well developed, well nourished, male appearing in no acute distress. Head: Normal  Neck: Supple  Lungs:  CTA. Heart: RRR Abdomen: Soft, non-tender, non-distended Extremities: No edema.  Neuro: Grossly intact  Labs    Hematology  Recent Labs Lab 06/10/16 1626 06/14/16 0334  WBC 7.3 11.1*  RBC 3.91* 3.84*  HGB 11.8* 11.6*  HCT 35.7* 36.3*  MCV 91.3 94.5  MCH 30.2 30.2  MCHC 33.1 32.0  RDW 14.7 14.8  PLT 170 187     Chemistry Recent Labs Lab 06/10/16 1626  06/13/16 0243 06/14/16 0334 06/15/16 0449  NA 138  < > 134* 137 138  K 3.5  < > 3.5 3.7 4.0  CL 103  < > 93* 95* 92*  CO2 24  < > 28 30 29   GLUCOSE 117*  < > 154* 135* 114*  BUN 49*  < > 50* 55* 61*  CREATININE 5.18*  < > 5.88* 5.87* 6.07*  CALCIUM 8.5*  < > 8.8* 8.9 9.4  PROT 5.7*  --   --   --   --   ALBUMIN 3.5  --  3.5 3.4* 3.6  AST 42*  --   --   --   --   ALT 126*  --   --   --   --   ALKPHOS 115  --   --   --   --   BILITOT 1.0  --   --   --   --   GFRNONAA 13*  < >  11* 11* 11*  GFRAA 15*  < > 13* 13* 12*  ANIONGAP 11  < > 13 12 17*  < > = values in this interval not displayed.   Cardiac Enzymes  Recent Labs Lab 06/10/16 1626 06/10/16 2335 06/11/16 0019 06/11/16 1057  TROPONINI 0.23* 0.20* 0.19* 0.25*    BNP  Recent Labs Lab 06/10/16 1626  BNP 1,211.1*    Drugs of Abuse     Component Value Date/Time   LABOPIA NONE DETECTED 06/10/2016 1743   COCAINSCRNUR NONE DETECTED 06/10/2016 1743   LABBENZ NONE DETECTED 06/10/2016 1743   AMPHETMU NONE DETECTED 06/10/2016 1743   THCU NONE DETECTED 06/10/2016 1743   LABBARB NONE DETECTED 06/10/2016 1743    Radiology    Dg Chest 2 View Result Date: 06/14/2016 CLINICAL DATA:  CHF.  Shortness of breath . EXAM: CHEST  2 VIEW COMPARISON:  06/10/2016. FINDINGS: Cardiomegaly with normal pulmonary vascularity. No focal infiltrate. Low lung volumes. No pleural effusion or pneumothorax. No acute bony abnormality identified. IMPRESSION: Stable cardiomegaly.  No CHF.  Low lung volumes. Electronically Signed   By: Marcello Moores  Register   On: 06/14/2016 08:32   Dg Chest 2 View Result Date: 06/10/2016 CLINICAL DATA:  Shortness of Breath EXAM: CHEST  2 VIEW COMPARISON:  None. FINDINGS: Cardiac shadow is enlarged. The lungs are well aerated bilaterally. No focal infiltrate or sizable effusion is seen. No bony abnormality is noted. IMPRESSION: No acute abnormality noted. Electronically Signed    By: Inez Catalina M.D.   On: 06/10/2016 11:50   US Renal Result Date: 06/10/2016 CLINICAL DATA:  Acute renal failure, history hypertension, smoking EXAM: RENAL / URINARY TRACT ULTRASOUND COMPLETE COMPARISON:  None FINDINGS: Right Kidney: Length: 11.6 cm. Normal cortical thickness. Mildly increased cortical echogenicity. No mass, hydronephrosis or shadowing calcification. Left Kidney: Length: 11.6 cm. Normal cortical thickness. Mildly increased cortical echogenicity. No mass, hydronephrosis or shadowing calcification. Bladder: Normal appearance IMPRESSION: Medical renal disease changes of both kidneys without evidence of renal mass or hydronephrosis. Electronically Signed   By: Lavonia Dana M.D.   On: 06/10/2016 19:27     Cardiac Studies   ECHO: 06/11/2016 - Left ventricle: The cavity size was normal. There was severe   concentric hypertrophy. Systolic function was moderately to   severely reduced. The estimated ejection fraction was 35%.   Diffuse hypokinesis. Features are consistent with a pseudonormal   left ventricular filling pattern, with concomitant abnormal   relaxation and increased filling pressure (grade 2 diastolic   dysfunction). Doppler parameters are consistent with elevated   ventricular end-diastolic filling pressure. - Aortic valve: Trileaflet; normal thickness leaflets. There was   mild to moderate regurgitation. - Aortic root: The aortic root was normal in size. - Mitral valve: There was mild regurgitation. - Left atrium: The atrium was severely dilated. - Right ventricle: Systolic function was normal. - Right atrium: The atrium was normal in size. - Pulmonary arteries: Systolic pressure was within the normal range. - Inferior vena cava: The vessel was normal in size. - Pericardium, extracardiac: There was no pericardial effusion. Impressions: - Severely decreased LVEF estimated at 35%, severe concentric LVH.   Severely decreased global longitudinal strain: -9.2%.    Grade 2 diastolic dysfunction with elevated filling pressures.   Normal RVEF.   Patient Profile     41 y.o. male w/ hx  HTN, remote cocaine use, recently quit tobacco, was admitted 03/29 for SOB>>CHF and malignant HTN.   Assessment & Plan    1 cardiomyopathy-likely  hypertensive mediated. Continue hydralazine/nitrates. No ACE inhibitor given renal insufficiency. Continue beta blocker. He will need follow-up echocardiogram 3 months after medications titrated to see if LV function improved.  2 hypertension-blood pressure improved. Continue carvedilol, hydralazine/nitrates and amlodipine. Continue Lasix per nephrology. Follow blood pressure as an outpatient and adjust as needed.  3 acute kidney disease-nephrology following and will need close follow-up with them after discharge.  4 substance abuse-patient needs to avoid.  Pt can be DCed from a cardiac standpoint and FU with Dr Sallyanne Kuster. Please call with questions.  Signed, Kirk Ruths , MD 8:13 AM 06/15/2016

## 2016-06-15 NOTE — Progress Notes (Signed)
Prescriptions called in to [facility] on [facility] per pt request.

## 2016-06-15 NOTE — Progress Notes (Signed)
Discharge instructions reviewed with pt. Pt dose not have any questions at this time. Pt stated he would schedule follow up appointments with Dr. Sallyanne Kuster and Dr. Lorrene Reid. Case manager made aware that pt dose not have a PCP and provided pt with information. Pt stated he would find a PCP and schedule an appointment within one week of discharge. IV d/c.

## 2016-06-15 NOTE — Discharge Summary (Signed)
Physician Discharge Summary  Nathan Campbell ZOX:[id number] DOB: [birth year] DOA: 06/10/2016  PCP: No PCP Per Patient  Admit date: 06/10/2016 Discharge date: 06/15/2016  Admitted From: Home Disposition: Home  Recommendations for Outpatient Follow-up:  1. Follow up with PCP in 1-2 weeks 2. Please obtain BMP/CBC in one week  Home Health: NA Equipment/Devices:NA  Discharge Condition: Stable CODE STATUS: Full Code Diet recommendation: Diet Heart Room service appropriate? Yes; Fluid consistency: Thin; Fluid restriction: 1500 mL Fluid Diet - low sodium heart healthy  Brief/Interim Summary: Nathan Smithis a 41 y.o.malewith medical history significant forpolysubstance abuse in early remission, and has been told he had high blood pressure, though with no PCP for many years and no medications, now presenting from urgent care where he was seen for progressive dyspnea, orthopnea, and bilateral lower extremity edema. Patient reports that he is in his usual state until approximately 2 weeks ago when he noted the insidious development of exertional dyspnea and orthopnea. He also was noted to have ankle swelling develop around that same time. Over the ensuing 2 weeks, the exertional dyspnea, swelling, and orthopnea all worsened. There is been no fevers or chills over this interval and no significant chest pain or palpitations. Patient endorses a history of cocaine abuse, reporting his last used 2 of been more than a month ago. He also reports that he quit smoking 2 weeks ago with the onset of this current symptoms and has not been drinking any alcohol and several weeks. He was noted to be hypertensive to 203/136 and mildly tachycardic at the urgent care and was directed to the ED for further evaluation.  Discharge Diagnoses:  Principal Problem:   Hypertensive heart disease with CHF (congestive heart failure) (HCC) Active Problems:   Acute combined systolic and diastolic CHF, NYHA class 4 (HCC)  Hypertensive urgency   Kidney disease   Elevated troponin   Elevated transaminase level   Normocytic anemia   Renal failure   Cardiomyopathy (Nathan Campbell)           Acute combined systolic and diastolic CHF  - Pt presents with progressive SOB, b/l ankle swelling, and orthopnea. -2-D echo showed LVEF of 35% and G2DD. -Likely his CHF is secondary to hypertensive heart disease rather than its ischemic cardiomyopathy. -Started initially on nitroglycerin drip and Lasix, nitroglycerin discontinued. -Currently on Norvasc, Coreg, hydralazine/Imdur and Lasix. ACEI/ARB not started because of elevated creatinine.  Hypertensive urgency  - BP elevated to 220/150 range initially in setting of acute CHF  - Volume is likely affecting this, continue diuresis. Discontinue Nitro drip. - Blood pressure improved for him, 144/66 this morning  Kidney disease of uncertain chronicity, likely CKD stage V  - SCr is 5.18 on admission with no prior available  - Renal US performed with bilateral medical renal disease, no mass or hydro  - Appreciate nephrology's help, needs to follow-up with nephrology as outpatient, discharged on Lasix 80 mg BID.  Elevated troponin  - Troponin elevated to 0.23 on admission with no chest pain. - Likely represents demand ischemia in setting of acute CHF with hypertensive urgency  - Plan to monitor on telemetry for ischemic changes, trend troponin, repeat EKG.  Elevated transaminases  - AST 42 and ALT 126 on admission with normal bilirubin and no abdominal pain  - Likely secondary to CHF and congestive hepatopathy   Tachycardia -Resting tachycardia likely secondary to acute CHF, continue beta blockers and diuresis.  Discharge Instructions  Discharge Instructions    Diet - low sodium heart healthy  Complete by:  As directed    Increase activity slowly    Complete by:  As directed      Allergies as of 06/15/2016   No Known Allergies     Medication List    TAKE these  medications   amLODipine 10 MG tablet Commonly known as:  NORVASC Take 1 tablet (10 mg total) by mouth daily. Start taking on:  06/16/2016   calcitRIOL 0.25 MCG capsule Commonly known as:  ROCALTROL Take 1 capsule (0.25 mcg total) by mouth daily. Start taking on:  06/16/2016   carvedilol 25 MG tablet Commonly known as:  COREG Take 1 tablet (25 mg total) by mouth 2 (two) times daily with a meal.   furosemide 80 MG tablet Commonly known as:  LASIX Take 1 tablet (80 mg total) by mouth 2 (two) times daily.   hydrALAZINE 100 MG tablet Commonly known as:  APRESOLINE Take 1 tablet (100 mg total) by mouth every 8 (eight) hours.   isosorbide mononitrate 60 MG 24 hr tablet Commonly known as:  IMDUR Take 1 tablet (60 mg total) by mouth daily. Start taking on:  06/16/2016      Follow-up Information    DUNHAM,CYNTHIA B, MD Follow up in 1 week(s).   Specialty:  Nephrology Contact information: [place] [address] [phone]        Sanda Klein, MD Follow up in 1 week(s).   Specialty:  Cardiology Contact information: [address] [place] [address] [phone]          No Known Allergies  Consultations:  Treatment Team:   [facility], MD  Jamal Maes, MD   Procedures (Echo, Carotid, EGD, Colonoscopy, ERCP)   Radiological studies: Dg Chest 2 View  Result Date: 06/14/2016 CLINICAL DATA:  CHF.  Shortness of breath . EXAM: CHEST  2 VIEW COMPARISON:  06/10/2016. FINDINGS: Cardiomegaly with normal pulmonary vascularity. No focal infiltrate. Low lung volumes. No pleural effusion or pneumothorax. No acute bony abnormality identified. IMPRESSION: Stable cardiomegaly.  No CHF.  Low lung volumes. Electronically Signed   By: Marcello Moores  Register   On: 06/14/2016 08:32   Dg Chest 2 View  Result Date: 06/10/2016 CLINICAL DATA:  Shortness of Breath EXAM: CHEST  2 VIEW COMPARISON:  None. FINDINGS: Cardiac shadow is enlarged. The lungs  are well aerated bilaterally. No focal infiltrate or sizable effusion is seen. No bony abnormality is noted. IMPRESSION: No acute abnormality noted. Electronically Signed   By: Inez Catalina M.D.   On: 06/10/2016 11:50   US Renal  Result Date: 06/10/2016 CLINICAL DATA:  Acute renal failure, history hypertension, smoking EXAM: RENAL / URINARY TRACT ULTRASOUND COMPLETE COMPARISON:  None FINDINGS: Right Kidney: Length: 11.6 cm. Normal cortical thickness. Mildly increased cortical echogenicity. No mass, hydronephrosis or shadowing calcification. Left Kidney: Length: 11.6 cm. Normal cortical thickness. Mildly increased cortical echogenicity. No mass, hydronephrosis or shadowing calcification. Bladder: Normal appearance IMPRESSION: Medical renal disease changes of both kidneys without evidence of renal mass or hydronephrosis. Electronically Signed   By: Lavonia Dana M.D.   On: 06/10/2016 19:27     Subjective:  Discharge Exam: Vitals:   06/15/16 0333 06/15/16 0532 06/15/16 0805 06/15/16 1334  BP:  (!) 162/83 (!) 142/66 (!) 161/79  Pulse:   (!) 103   Resp:   (!) 27   Temp:   98.4 F (36.9 C)   TempSrc:   Oral   SpO2:      Weight: 122.5 kg (270 lb  1.6 oz)     Height:       General: Pt is alert, awake, not in acute distress Cardiovascular: RRR, S1/S2 +, no rubs, no gallops Respiratory: CTA bilaterally, no wheezing, no rhonchi Abdominal: Soft, NT, ND, bowel sounds + Extremities: no edema, no cyanosis   The results of significant diagnostics from this hospitalization (including imaging, microbiology, ancillary and laboratory) are listed below for reference.    Microbiology: Recent Results (from the past 240 hour(s))  MRSA PCR Screening     Status: None   Collection Time: 06/10/16  9:47 PM  Result Value Ref Range Status   MRSA by PCR NEGATIVE NEGATIVE Final    Comment:        The GeneXpert MRSA Assay (FDA approved for NASAL specimens only), is one component of a comprehensive MRSA  colonization surveillance program. It is not intended to diagnose MRSA infection nor to guide or monitor treatment for MRSA infections.      Labs: BNP (last 3 results)  Recent Labs  06/10/16 1626  BNP 5,852.7*   Basic Metabolic Panel:  Recent Labs Lab 06/11/16 0019 06/12/16 0258 06/13/16 0243 06/14/16 0334 06/15/16 0449  NA 139 139 134* 137 138  K 3.8 3.5 3.5 3.7 4.0  CL 102 97* 93* 95* 92*  CO2 23 30 28 30 29   GLUCOSE 171* 124* 154* 135* 114*  BUN 51* 48* 50* 55* 61*  CREATININE 5.12* 5.67* 5.88* 5.87* 6.07*  CALCIUM 8.6* 8.6* 8.8* 8.9 9.4  PHOS  --   --  5.8* 5.6* 5.9*   Liver Function Tests:  Recent Labs Lab 06/10/16 1626 06/13/16 0243 06/14/16 0334 06/15/16 0449  AST 42*  --   --   --   ALT 126*  --   --   --   ALKPHOS 115  --   --   --   BILITOT 1.0  --   --   --   PROT 5.7*  --   --   --   ALBUMIN 3.5 3.5 3.4* 3.6   No results for input(s): LIPASE, AMYLASE in the last 168 hours. No results for input(s): AMMONIA in the last 168 hours. CBC:  Recent Labs Lab 06/10/16 1626 06/14/16 0334  WBC 7.3 11.1*  HGB 11.8* 11.6*  HCT 35.7* 36.3*  MCV 91.3 94.5  PLT 170 187   Cardiac Enzymes:  Recent Labs Lab 06/10/16 1626 06/10/16 2335 06/11/16 0019 06/11/16 1057  TROPONINI 0.23* 0.20* 0.19* 0.25*   BNP: Invalid input(s): POCBNP CBG: No results for input(s): GLUCAP in the last 168 hours. D-Dimer No results for input(s): DDIMER in the last 72 hours. Hgb A1c No results for input(s): HGBA1C in the last 72 hours. Lipid Profile No results for input(s): CHOL, HDL, LDLCALC, TRIG, CHOLHDL, LDLDIRECT in the last 72 hours. Thyroid function studies No results for input(s): TSH, T4TOTAL, T3FREE, THYROIDAB in the last 72 hours.  Invalid input(s): FREET3 Anemia work up No results for input(s): VITAMINB12, FOLATE, FERRITIN, TIBC, IRON, RETICCTPCT in the last 72 hours. Urinalysis    Component Value Date/Time   COLORURINE YELLOW 06/14/2016 1939    APPEARANCEUR CLEAR 06/14/2016 1939   LABSPEC 1.011 06/14/2016 1939   PHURINE 6.0 06/14/2016 1939   GLUCOSEU NEGATIVE 06/14/2016 1939   HGBUR NEGATIVE 06/14/2016 1939   BILIRUBINUR NEGATIVE 06/14/2016 [place] NEGATIVE 06/14/2016 1939   PROTEINUR 100 (A) 06/14/2016 1939   NITRITE NEGATIVE 06/14/2016 1939   LEUKOCYTESUR NEGATIVE 06/14/2016 1939   Sepsis Labs Invalid input(s):  PROCALCITONIN,  WBC,  LACTICIDVEN Microbiology Recent Results (from the past 240 hour(s))  MRSA PCR Screening     Status: None   Collection Time: 06/10/16  9:47 PM  Result Value Ref Range Status   MRSA by PCR NEGATIVE NEGATIVE Final    Comment:        The GeneXpert MRSA Assay (FDA approved for NASAL specimens only), is one component of a comprehensive MRSA colonization surveillance program. It is not intended to diagnose MRSA infection nor to guide or monitor treatment for MRSA infections.      Time coordinating discharge: Over 30 minutes  SIGNED:   Birdie Hopes, MD  [facility] 06/15/2016, 2:06 PM Pager   If 7PM-7AM, please contact night-coverage [email or web address] Password [email or web address]

## 2016-06-15 NOTE — Care Management Note (Signed)
Case Management Note  Patient Details  Name: Nathan Campbell MRN: [id number] Date of Birth: 1976-03-07  Subjective/Objective:    CHF, HTN, Kidney Disease          Action/Plan: Discharge Planning:    NCM spoke to pt and provide him with information on [facility] to call and arrange follow up appt. Also he can contact toll free number on his insurance card for a list of providers or check on [employer]. Reports he works.    Expected Discharge Date:  06/15/16               Expected Discharge Plan:  Home/Self Care  In-House Referral:  NA  Discharge planning Services  CM Consult  Post Acute Care Choice:  NA Choice offered to:  NA  DME Arranged:  N/A DME Agency:  NA  HH Arranged:  NA HH Agency:  NA  Status of Service:  Completed, signed off  If discussed at [place] of Stay Meetings, dates discussed:    Additional Comments:  Erenest Rasher, RN 06/15/2016, 2:55 PM

## 2016-06-21 DIAGNOSIS — N189 Chronic kidney disease, unspecified: Secondary | ICD-10-CM | POA: Diagnosis not present

## 2016-06-21 DIAGNOSIS — N185 Chronic kidney disease, stage 5: Secondary | ICD-10-CM | POA: Diagnosis not present

## 2016-06-21 DIAGNOSIS — E669 Obesity, unspecified: Secondary | ICD-10-CM | POA: Diagnosis not present

## 2016-06-21 DIAGNOSIS — R7303 Prediabetes: Secondary | ICD-10-CM | POA: Diagnosis not present

## 2016-06-21 DIAGNOSIS — I10 Essential (primary) hypertension: Secondary | ICD-10-CM | POA: Diagnosis not present

## 2016-06-22 DIAGNOSIS — I120 Hypertensive chronic kidney disease with stage 5 chronic kidney disease or end stage renal disease: Secondary | ICD-10-CM | POA: Diagnosis not present

## 2016-06-22 DIAGNOSIS — N185 Chronic kidney disease, stage 5: Secondary | ICD-10-CM | POA: Diagnosis not present

## 2016-06-22 DIAGNOSIS — N2581 Secondary hyperparathyroidism of renal origin: Secondary | ICD-10-CM | POA: Diagnosis not present

## 2016-06-22 DIAGNOSIS — D631 Anemia in chronic kidney disease: Secondary | ICD-10-CM | POA: Diagnosis not present

## 2016-06-29 ENCOUNTER — Telehealth: Payer: Self-pay | Admitting: Cardiology

## 2016-06-29 NOTE — Telephone Encounter (Signed)
Received records from [facility] for appointment on 06/30/16 with Kerin Ransom, [place].  Records put with Luke's schedule for 06/30/16. lp

## 2016-06-30 ENCOUNTER — Encounter: Payer: Self-pay | Admitting: Cardiology

## 2016-06-30 ENCOUNTER — Ambulatory Visit (INDEPENDENT_AMBULATORY_CARE_PROVIDER_SITE_OTHER): Payer: BLUE CROSS/BLUE SHIELD | Admitting: Cardiology

## 2016-06-30 VITALS — BP 132/98 | HR 82 | Ht 74.0 in | Wt 272.0 lb

## 2016-06-30 DIAGNOSIS — I5040 Unspecified combined systolic (congestive) and diastolic (congestive) heart failure: Secondary | ICD-10-CM | POA: Diagnosis not present

## 2016-06-30 DIAGNOSIS — I160 Hypertensive urgency: Secondary | ICD-10-CM

## 2016-06-30 DIAGNOSIS — I110 Hypertensive heart disease with heart failure: Secondary | ICD-10-CM | POA: Diagnosis not present

## 2016-06-30 DIAGNOSIS — N185 Chronic kidney disease, stage 5: Secondary | ICD-10-CM

## 2016-06-30 DIAGNOSIS — I5041 Acute combined systolic (congestive) and diastolic (congestive) heart failure: Secondary | ICD-10-CM

## 2016-06-30 DIAGNOSIS — I428 Other cardiomyopathies: Secondary | ICD-10-CM

## 2016-06-30 DIAGNOSIS — I420 Dilated cardiomyopathy: Secondary | ICD-10-CM

## 2016-06-30 NOTE — Patient Instructions (Addendum)
Testing/Procedures:PLEASE SCHEDULE MID-LATE June Echocardiogram - Your physician has requested that you have an echocardiogram. Echocardiography is a painless test that uses sound waves to create images of your heart. It provides your doctor with information about the size and shape of your heart and how well your heart's chambers and valves are working. This procedure takes approximately one hour. There are no restrictions for this procedure. This will be performed at our [facility] location - [address].   Follow-Up: Your physician wants you to follow-up in: SOON AFTER ECHO WITH DR Sallyanne Kuster   Thank you for choosing [facility] at [facility]!!

## 2016-06-30 NOTE — Assessment & Plan Note (Signed)
Grade 2 DD on echo

## 2016-06-30 NOTE — Assessment & Plan Note (Signed)
B/P better but still a little high- 132/90 by me- on max medical Rx

## 2016-06-30 NOTE — Assessment & Plan Note (Signed)
Followed by Dr Lorrene Reid. SCr was 6.3 on 06/21/16

## 2016-06-30 NOTE — Assessment & Plan Note (Signed)
Admitted with HTN urgency and CHF 06/10/16-06/15/16

## 2016-06-30 NOTE — Progress Notes (Signed)
06/30/2016 Nathan Campbell   09-08-1975  [id number]  Primary Physician No PCP Per Patient Primary Cardiologist: Dr Sallyanne Kuster  HPI:  41 y/o AA male with HTN was sent from [facility] 06/10/16 after presenting with there DOE and elevated BP.  On arrival at [facility] his BP was 203/136 . He was admitted and diuresed. His B/P was brought under better control. Echo showed an EF of 35% with global HK and grade 2 DD. It was presumed he had a NICM, no ischemic work up planned. He was also seen by the renal service for acute on chronic renal insufficiency. He has seen Dr Lorrene Reid in f/u- his SCr was 6.3 on 4/9. The plan is for watchful waiting at this time to see if his renal function doesn't improve some.   He is in the office today for post hospital follow up. He is doing well, no orthopnea, no unusual dyspnea. He gets dizzy if he stands up to fast but this is getting better. No syncope, no tachycardia.    Current Outpatient Prescriptions  Medication Sig Dispense Refill  . amLODipine (NORVASC) 10 MG tablet Take 1 tablet (10 mg total) by mouth daily. 30 tablet 1  . calcitRIOL (ROCALTROL) 0.25 MCG capsule Take 1 capsule (0.25 mcg total) by mouth daily. 30 capsule 0  . calcium elemental as carbonate (BARIATRIC TUMS ULTRA) 400 MG chewable tablet Chew 1,000 mg by mouth 3 (three) times daily.    . carvedilol (COREG) 25 MG tablet Take 1 tablet (25 mg total) by mouth 2 (two) times daily with a meal. 60 tablet 1  . furosemide (LASIX) 80 MG tablet Take 1 tablet (80 mg total) by mouth 2 (two) times daily. 60 tablet 1  . hydrALAZINE (APRESOLINE) 100 MG tablet Take 1 tablet (100 mg total) by mouth every 8 (eight) hours. 90 tablet 1  . isosorbide mononitrate (IMDUR) 60 MG 24 hr tablet Take 1 tablet (60 mg total) by mouth daily. 30 tablet 1   No current facility-administered medications for this visit.     No Known Allergies  Past Medical History:  Diagnosis Date  . Acute combined systolic and diastolic CHF, NYHA class 4  ([place]) 06/10/2016  . Cardiomyopathy ([place]) 06/14/2016  . CHF (congestive heart failure) ([place])   . Dyspnea   . Hypertension   . Hypertensive heart and kidney disease with heart failure ([place]) 06/14/2016  . Hypertensive heart disease with congestive heart failure [facility])     Social History   Social History  . Marital status: Single    Spouse name: N/A  . Number of children: N/A  . Years of education: N/A   Occupational History  . Not on file.   Social History Main Topics  . Smoking status: Former Smoker    Packs/day: 0.50    Years: 5.00    Types: Cigarettes  . Smokeless tobacco: Never Used  . Alcohol use Yes     Comment: socially  . Drug use: No  . Sexual activity: Not on file   Other Topics Concern  . Not on file   Social History Narrative   Epworth Sleepiness Score: 6     Family History  Problem Relation Age of Onset  . Diabetes Father   . Hypertension Father   . Heart disease Cousin   . Heart disease Cousin   . Hypertension Mother   . Hypertension Sister   . Hypertension Paternal Grandmother   . Diabetes Mellitus II Paternal Grandfather  Review of Systems: General: negative for chills, fever, night sweats or weight changes.  Cardiovascular: negative for chest pain, dyspnea on exertion, edema, orthopnea, palpitations, paroxysmal nocturnal dyspnea or shortness of breath Dermatological: negative for rash Respiratory: negative for cough or wheezing Urologic: negative for hematuria Abdominal: negative for nausea, vomiting, diarrhea, bright red blood per rectum, melena, or hematemesis Neurologic: negative for visual changes, syncope, or dizziness All other systems reviewed and are otherwise negative except as noted above.    Blood pressure (!) 132/98, pulse 82, height 6\' 2"  (1.88 m), weight 272 lb (123.4 kg).  General appearance: alert, cooperative, no distress and moderately obese Neck: no carotid bruit and no JVD Lungs: clear to auscultation  bilaterally Heart: regular rate and rhythm Extremities: extremities normal, atraumatic, no cyanosis or edema Skin: Skin color, texture, turgor normal. No rashes or lesions Neurologic: Grossly normal   ASSESSMENT AND PLAN:   Acute combined systolic and diastolic CHF, NYHA class 4 (HCC) Admitted with HTN urgency and CHF 06/10/16-06/15/16  Hypertensive heart disease with CHF (congestive heart failure) (HCC) Grade 2 DD on echo  Hypertensive urgency B/P better but still a little high- 132/90 by me- on max medical Rx  CRI (chronic renal insufficiency), stage 5 (HCC) Followed by Dr Lorrene Reid. SCr was 6.3 on 06/21/16   PLAN  Not much room to increase his medications- will continue current doses. F/U echo in 3 months, then f/u with dr Sallyanne Kuster. This was discussed with the pt and his father who accompanied him today to the office. He knows to contact us if he develops orthopnea, tachycardia, or sudden dyspnea.   Kerin Ransom PA-C 06/30/2016 3:51 PM

## 2016-07-02 DIAGNOSIS — I10 Essential (primary) hypertension: Secondary | ICD-10-CM | POA: Diagnosis not present

## 2016-07-02 DIAGNOSIS — N189 Chronic kidney disease, unspecified: Secondary | ICD-10-CM | POA: Diagnosis not present

## 2016-07-02 DIAGNOSIS — Q2739 Arteriovenous malformation, other site: Secondary | ICD-10-CM | POA: Diagnosis not present

## 2016-07-02 DIAGNOSIS — R7303 Prediabetes: Secondary | ICD-10-CM | POA: Diagnosis not present

## 2016-07-12 DIAGNOSIS — N2581 Secondary hyperparathyroidism of renal origin: Secondary | ICD-10-CM | POA: Diagnosis not present

## 2016-07-12 DIAGNOSIS — N185 Chronic kidney disease, stage 5: Secondary | ICD-10-CM | POA: Diagnosis not present

## 2016-07-12 DIAGNOSIS — D631 Anemia in chronic kidney disease: Secondary | ICD-10-CM | POA: Diagnosis not present

## 2016-07-16 DIAGNOSIS — I120 Hypertensive chronic kidney disease with stage 5 chronic kidney disease or end stage renal disease: Secondary | ICD-10-CM | POA: Diagnosis not present

## 2016-07-16 DIAGNOSIS — N185 Chronic kidney disease, stage 5: Secondary | ICD-10-CM | POA: Diagnosis not present

## 2016-07-16 DIAGNOSIS — R809 Proteinuria, unspecified: Secondary | ICD-10-CM | POA: Diagnosis not present

## 2016-07-16 DIAGNOSIS — D631 Anemia in chronic kidney disease: Secondary | ICD-10-CM | POA: Diagnosis not present

## 2016-08-20 DIAGNOSIS — I110 Hypertensive heart disease with heart failure: Secondary | ICD-10-CM | POA: Diagnosis not present

## 2016-08-20 DIAGNOSIS — E669 Obesity, unspecified: Secondary | ICD-10-CM | POA: Diagnosis not present

## 2016-08-20 DIAGNOSIS — N189 Chronic kidney disease, unspecified: Secondary | ICD-10-CM | POA: Diagnosis not present

## 2016-08-20 DIAGNOSIS — R7303 Prediabetes: Secondary | ICD-10-CM | POA: Diagnosis not present

## 2016-08-31 DIAGNOSIS — H10023 Other mucopurulent conjunctivitis, bilateral: Secondary | ICD-10-CM | POA: Diagnosis not present

## 2016-09-01 ENCOUNTER — Ambulatory Visit (HOSPITAL_COMMUNITY): Payer: BLUE CROSS/BLUE SHIELD

## 2016-09-04 ENCOUNTER — Emergency Department (HOSPITAL_COMMUNITY)
Admission: EM | Admit: 2016-09-04 | Discharge: 2016-09-04 | Disposition: A | Discharge status: Home / Self Care | Payer: BLUE CROSS/BLUE SHIELD | Attending: Emergency Medicine | Admitting: Emergency Medicine

## 2016-09-04 ENCOUNTER — Encounter (HOSPITAL_COMMUNITY): Payer: Self-pay

## 2016-09-04 DIAGNOSIS — H109 Unspecified conjunctivitis: Secondary | ICD-10-CM

## 2016-09-04 DIAGNOSIS — Z87891 Personal history of nicotine dependence: Secondary | ICD-10-CM | POA: Insufficient documentation

## 2016-09-04 DIAGNOSIS — H5713 Ocular pain, bilateral: Secondary | ICD-10-CM | POA: Diagnosis not present

## 2016-09-04 DIAGNOSIS — I110 Hypertensive heart disease with heart failure: Secondary | ICD-10-CM | POA: Diagnosis not present

## 2016-09-04 DIAGNOSIS — Z79899 Other long term (current) drug therapy: Secondary | ICD-10-CM | POA: Diagnosis not present

## 2016-09-04 DIAGNOSIS — I5041 Acute combined systolic (congestive) and diastolic (congestive) heart failure: Secondary | ICD-10-CM | POA: Diagnosis not present

## 2016-09-04 MED ORDER — ERYTHROMYCIN 5 MG/GM OP OINT: TOPICAL_OINTMENT | OPHTHALMIC | 0 refills | Status: DC

## 2016-09-04 MED ORDER — TETRACAINE HCL 0.5 % OP SOLN
2.0000 [drp] | Freq: Once | OPHTHALMIC | Status: AC
  Administered 2016-09-04: 2 [drp] via OPHTHALMIC
  Filled 2016-09-04: qty 4

## 2016-09-04 MED ORDER — FLUORESCEIN SODIUM 0.6 MG OP STRP
1.0000 | ORAL_STRIP | Freq: Once | OPHTHALMIC | Status: AC
  Administered 2016-09-04: 1 via OPHTHALMIC
  Filled 2016-09-04: qty 1

## 2016-09-04 NOTE — ED Provider Notes (Signed)
[place] DEPT Provider Note   By signing my name below, I, Bea Graff, attest that this documentation has been prepared under the direction and in the presence of Ocie Cornfield, PA-C. Electronically Signed: Bea Graff, ED Scribe. 09/04/16. 11:04 AM.   History   Chief Complaint No chief complaint on file.  The history is provided by the patient and medical records. No language interpreter was used.    Nathan Campbell is an obese 41 y.o. male who presents to the Emergency Department complaining of bilateral eye redness and irritation that began about one week ago. He reports associated drainage and crusting in the morning upon waking. He states he was evaluated for this in the past few days and was prescribed an unknown eye drop that he reports using with no significant relief. There are no modifying factors noted. He denies vision loss, fever, chills. Pt denies using contact lenses. His tetanus vaccination is UTD.Patient states that he was around another person with "pink eye".   Past Medical History:  Diagnosis Date  . Acute combined systolic and diastolic CHF, NYHA class 4 ([place]) 06/10/2016  . Cardiomyopathy ([place]) 06/14/2016  . CHF (congestive heart failure) ([place])   . Dyspnea   . Hypertension   . Hypertensive heart and kidney disease with heart failure ([place]) 06/14/2016  . Hypertensive heart disease with congestive heart failure [facility])     Patient Active Problem List   Diagnosis Date Noted  . Hypertensive heart disease with CHF (congestive heart failure) ([place]) 06/14/2016  . NICM (nonischemic cardiomyopathy) ([place]) 06/14/2016  . Renal failure   . Acute combined systolic and diastolic CHF, NYHA class 4 ([place]) 06/10/2016  . Hypertensive urgency 06/10/2016  . CRI (chronic renal insufficiency), stage 5 (HCC) 06/10/2016  . Elevated troponin 06/10/2016  . Elevated transaminase level 06/10/2016  . Normocytic anemia 06/10/2016    Past Surgical History:  Procedure  Laterality Date  . NO PAST SURGERIES         Home Medications    Prior to Admission medications   Medication Sig Start Date End Date Taking? Authorizing Provider  amLODipine (NORVASC) 10 MG tablet Take 1 tablet (10 mg total) by mouth daily. 06/16/16   Verlee Monte, MD  calcitRIOL (ROCALTROL) 0.25 MCG capsule Take 1 capsule (0.25 mcg total) by mouth daily. 06/16/16   Verlee Monte, MD  calcium elemental as carbonate (BARIATRIC TUMS ULTRA) 400 MG chewable tablet Chew 1,000 mg by mouth 3 (three) times daily.    [provider]  carvedilol (COREG) 25 MG tablet Take 1 tablet (25 mg total) by mouth 2 (two) times daily with a meal. 06/15/16   Verlee Monte, MD  furosemide (LASIX) 80 MG tablet Take 1 tablet (80 mg total) by mouth 2 (two) times daily. 06/15/16   Verlee Monte, MD  hydrALAZINE (APRESOLINE) 100 MG tablet Take 1 tablet (100 mg total) by mouth every 8 (eight) hours. 06/15/16   Verlee Monte, MD  isosorbide mononitrate (IMDUR) 60 MG 24 hr tablet Take 1 tablet (60 mg total) by mouth daily. 06/16/16   Verlee Monte, MD    Family History Family History  Problem Relation Age of Onset  . Diabetes Father   . Hypertension Father   . Heart disease Cousin   . Heart disease Cousin   . Hypertension Mother   . Hypertension Sister   . Hypertension Paternal Grandmother   . Diabetes Mellitus II Paternal Grandfather     Social History Social History  Substance Use Topics  . Smoking status:  Former Smoker    Packs/day: 0.50    Years: 5.00    Types: Cigarettes  . Smokeless tobacco: Never Used  . Alcohol use Yes     Comment: socially     Allergies   Patient has no known allergies.   Review of Systems Review of Systems  Constitutional: Negative for chills and fever.  Eyes: Positive for discharge, redness and itching. Negative for visual disturbance.     Physical Exam Updated Vital Signs BP (!) 143/91   Pulse 79   Temp 98.2 F (36.8 C) (Oral)   Resp 18   SpO2 100%    Physical Exam  Constitutional: He is oriented to person, place, and time. He appears well-developed and well-nourished.  HENT:  Head: Normocephalic and atraumatic.  Eyes: Right eye exhibits discharge. Right eye exhibits no chemosis. Left eye exhibits discharge. Left eye exhibits no chemosis. Right conjunctiva is injected. Left conjunctiva is injected.  Slit lamp exam:      The right eye shows no corneal abrasion, no hyphema and no fluorescein uptake.       The left eye shows no corneal abrasion, no hyphema and no fluorescein uptake.    Visual Acuity  Right Eye Distance: 20/20 Left Eye Distance: 20/20 Bilateral Distance: 20/20  Right Eye Near:   Left Eye Near:    Bilateral Near:     Injected conjunctiva bilaterally with purulent drainage. No chemosis or hyphema. No orbital or periorbital erythema or edema. No proptosis.  Neck: Normal range of motion.  Cardiovascular: Normal rate.   Pulmonary/Chest: Effort normal.  Musculoskeletal: Normal range of motion.  Neurological: He is alert and oriented to person, place, and time.  Skin: Skin is warm and dry.  Psychiatric: He has a normal mood and affect. His behavior is normal.  Nursing note and vitals reviewed.    ED Treatments / Results  DIAGNOSTIC STUDIES: Oxygen Saturation is 100% on RA, normal by my interpretation.   COORDINATION OF CARE: 10:33 AM- Will instill tetracaine drops and apply fluorescein strip to check for abrasions/lacerations. Will prescribe Erythromycin ointment. Pt verbalizes understanding and agrees to plan.  Medications  fluorescein ophthalmic strip 1 strip (1 strip Both Eyes Given 09/04/16 1051)  tetracaine (PONTOCAINE) 0.5 % ophthalmic solution 2 drop (2 drops Both Eyes Given 09/04/16 1051)    Labs (all labs ordered are listed, but only abnormal results are displayed) Labs Reviewed - No data to display  EKG  EKG Interpretation None       Radiology No results found.  Procedures Procedures  (including critical care time)  Medications Ordered in ED Medications  fluorescein ophthalmic strip 1 strip (1 strip Both Eyes Given 09/04/16 1051)  tetracaine (PONTOCAINE) 0.5 % ophthalmic solution 2 drop (2 drops Both Eyes Given 09/04/16 1051)     Initial Impression / Assessment and Plan / ED Course  I have reviewed the triage vital signs and the nursing notes.  Pertinent labs & imaging results that were available during my care of the patient were reviewed by me and considered in my medical decision making (see chart for details).     Patient presentation consistent with Bacterial conjunctivitis.  No evidence of corneal abrasions, entrapment, consensual photophobia, or herpes keratitis.  Presentation not concerning for iritis, or corneal abrasions, or orbital/periorbital cellulitis.  Pt discharged with Erythromycin ointment.  Personal hygiene and frequent handwashing discussed. Patient advised to follow up with ophthalmologist if symptoms persist or worsen. Return precautions discussed.  Patient verbalizes understanding and is agreeable  with discharge.   Final Clinical Impressions(s) / ED Diagnoses   Final diagnoses:  Bacterial conjunctivitis    New Prescriptions New Prescriptions   ERYTHROMYCIN OPHTHALMIC OINTMENT    Place a 1/2 inch ribbon of ointment into the lower eyelid 4 times per day for 7 days.    I personally performed the services described in this documentation, which was scribed in my presence. The recorded information has been reviewed and is accurate.      Doristine Devoid, PA-C 09/04/16 1110    Doristine Devoid, PA-C 09/04/16 1205    Forde Dandy, MD 09/04/16 1925

## 2016-09-04 NOTE — ED Triage Notes (Signed)
Patient complains of bilateral eye redness x 1 week, seen for same a few days ago and using drops with no relief

## 2016-09-04 NOTE — Discharge Instructions (Signed)
You have been diagnosed with bacterial conjunctivitis. Please use the erythromycin ointment as prescribed 4 times a day for 7 days. Make she had good hand hygiene. Warm compresses to your eyelids will help with the pain and discharge. Motrin and Tylenol for pain and swelling. Please follow-up with ophthalmologist if symptoms not improving.

## 2016-09-04 NOTE — ED Notes (Signed)
Bilateral eyes extremely red and irritated x 4 days. States he was around another person with "pink-eye".

## 2016-09-10 DIAGNOSIS — N2581 Secondary hyperparathyroidism of renal origin: Secondary | ICD-10-CM | POA: Diagnosis not present

## 2016-09-10 DIAGNOSIS — N185 Chronic kidney disease, stage 5: Secondary | ICD-10-CM | POA: Diagnosis not present

## 2016-09-10 DIAGNOSIS — D631 Anemia in chronic kidney disease: Secondary | ICD-10-CM | POA: Diagnosis not present

## 2016-09-10 DIAGNOSIS — I120 Hypertensive chronic kidney disease with stage 5 chronic kidney disease or end stage renal disease: Secondary | ICD-10-CM | POA: Diagnosis not present

## 2016-09-10 DIAGNOSIS — R809 Proteinuria, unspecified: Secondary | ICD-10-CM | POA: Diagnosis not present

## 2016-09-13 ENCOUNTER — Other Ambulatory Visit (HOSPITAL_COMMUNITY): Payer: BLUE CROSS/BLUE SHIELD

## 2016-09-13 ENCOUNTER — Encounter (HOSPITAL_COMMUNITY): Payer: Self-pay

## 2016-09-21 ENCOUNTER — Telehealth: Payer: Self-pay | Admitting: Cardiovascular Disease

## 2016-09-21 NOTE — Telephone Encounter (Signed)
Received records from [facility] for appointment on 10/11/16 with Dr Sallyanne Kuster.  Records put with Dr Croitoru's schedule for 10/11/16. lp

## 2016-09-29 ENCOUNTER — Encounter (HOSPITAL_COMMUNITY): Payer: Self-pay

## 2016-09-29 ENCOUNTER — Other Ambulatory Visit (HOSPITAL_COMMUNITY): Payer: BLUE CROSS/BLUE SHIELD

## 2016-09-29 DIAGNOSIS — R7989 Other specified abnormal findings of blood chemistry: Secondary | ICD-10-CM | POA: Diagnosis not present

## 2016-10-06 ENCOUNTER — Telehealth (HOSPITAL_COMMUNITY): Payer: Self-pay | Admitting: Cardiology

## 2016-10-06 NOTE — Telephone Encounter (Signed)
Patient cancelled his appt on 09/01/16 and no-showed on 7/2 and 7/18. He will be removed from the workqueue.

## 2016-10-11 ENCOUNTER — Encounter: Payer: Self-pay | Admitting: Cardiovascular Disease

## 2016-10-11 ENCOUNTER — Ambulatory Visit (INDEPENDENT_AMBULATORY_CARE_PROVIDER_SITE_OTHER): Payer: BLUE CROSS/BLUE SHIELD | Admitting: Cardiovascular Disease

## 2016-10-11 VITALS — BP 140/96 | HR 100 | Ht 72.0 in | Wt 269.2 lb

## 2016-10-11 DIAGNOSIS — I110 Hypertensive heart disease with heart failure: Secondary | ICD-10-CM | POA: Diagnosis not present

## 2016-10-11 DIAGNOSIS — I10 Essential (primary) hypertension: Secondary | ICD-10-CM | POA: Diagnosis not present

## 2016-10-11 DIAGNOSIS — N185 Chronic kidney disease, stage 5: Secondary | ICD-10-CM | POA: Diagnosis not present

## 2016-10-11 DIAGNOSIS — I5022 Chronic systolic (congestive) heart failure: Secondary | ICD-10-CM | POA: Diagnosis not present

## 2016-10-11 MED ORDER — CARVEDILOL 25 MG PO TABS: 37.5000 mg | ORAL_TABLET | Freq: Two times a day (BID) | ORAL | 11 refills | Status: DC

## 2016-10-11 NOTE — Progress Notes (Signed)
Cardiology Office Note:    Date:  10/14/2016   ID:  Mcarthur Rossetti, DOB [birth year], MRN [id number]  PCP:  Patient, No Pcp Per  Cardiologist:  Sanda Klein, MD    Referring MD: No ref. provider found   Chief complaint: CHF follow up  History of Present Illness:    Nathan Campbell is a 41 y.o. male with a hx of moderately depressed left ventricular systolic function (EF 18%) and congestive heart failure, advanced chronic kidney disease, both probably related to severe malignant hypertension. He was hospitalized in March 2018 with a blood pressure 203/136 and a serum creatinine of >6 and nephrotic range proteinuria.  He has had substantial improvement in shortness of breath. He does not have leg edema. He is currently on high doses of 5 different antihypertensive agents (amlodipine 10 mg daily, hydralazine 100 mg 3 times daily, isosorbide mononitrate 60 mg daily, carvedilol 25 mg twice daily, furosemide 80 mg twice daily). Dr. Kaylyn Lim note also reports that he is taking doxazosin 4 mg daily. BP is still too high.He denies dizziness, syncope, palpitations or angina. He does not have orthopnea or PND. No neuro complaints. He is seeing Dr. Lorrene Reid at [facility] and reports that his renal function has improved. The most recent labs I have available are from July 2 and show creatinine of 3.27.  Past Medical History:  Diagnosis Date  . Acute combined systolic and diastolic CHF, NYHA class 4 ([place]) 06/10/2016  . Cardiomyopathy ([place]) 06/14/2016  . CHF (congestive heart failure) ([place])   . Dyspnea   . Hypertension   . Hypertensive heart and kidney disease with heart failure ([place]) 06/14/2016  . Hypertensive heart disease with congestive heart failure [facility])     Past Surgical History:  Procedure Laterality Date  . NO PAST SURGERIES      Current Medications: Current Meds  Medication Sig  . amLODipine (NORVASC) 10 MG tablet Take 1 tablet (10 mg total) by mouth daily.  . calcitRIOL  (ROCALTROL) 0.25 MCG capsule Take 1 capsule (0.25 mcg total) by mouth daily.  . calcium elemental as carbonate (BARIATRIC TUMS ULTRA) 400 MG chewable tablet Chew 1,000 mg by mouth 3 (three) times daily.  . carvedilol (COREG) 25 MG tablet Take 1.5 tablets (37.5 mg total) by mouth 2 (two) times daily with a meal.  . furosemide (LASIX) 80 MG tablet Take 1 tablet (80 mg total) by mouth 2 (two) times daily.  . hydrALAZINE (APRESOLINE) 100 MG tablet Take 1 tablet (100 mg total) by mouth every 8 (eight) hours.  . isosorbide mononitrate (IMDUR) 60 MG 24 hr tablet Take 1 tablet (60 mg total) by mouth daily.  . [DISCONTINUED] carvedilol (COREG) 25 MG tablet Take 1 tablet (25 mg total) by mouth 2 (two) times daily with a meal.     Allergies:   Patient has no known allergies.   Social History   Social History  . Marital status: Single    Spouse name: N/A  . Number of children: N/A  . Years of education: N/A   Social History Main Topics  . Smoking status: Former Smoker    Packs/day: 0.50    Years: 5.00    Types: Cigarettes  . Smokeless tobacco: Never Used  . Alcohol use Yes     Comment: socially  . Drug use: No  . Sexual activity: Not Asked   Other Topics Concern  . None   Social History Narrative   Epworth Sleepiness Score: 6     Family History:  The patient's family history includes Diabetes in his father; Diabetes Mellitus II in his paternal grandfather; Heart disease in his cousin and cousin; Hypertension in his father, mother, paternal grandmother, and sister. ROS:   Please see the history of present illness.     All other systems reviewed and are negative.  EKGs/Labs/Other Studies Reviewed:    The following studies were reviewed today: Clinic notes and labs from office visit at [facility] July 2  EKG:  EKG is not ordered today.  The ekg ordered 06/30/16 demonstrates normal sinus rhythm, left ventricular hypertrophy with prominent secondary repolarization changes  Recent  Labs: 06/10/2016: ALT 126; B Natriuretic Peptide 1,211.1 06/14/2016: Hemoglobin 11.6; Platelets 187 06/15/2016: BUN 61; Creatinine, Ser 6.07; Potassium 4.0; Sodium 138    July 2 creatinine 3.27  Physical Exam:    VS:  BP (!) 140/96   Pulse 100   Ht 6' (1.829 m)   Wt 269 lb 3.2 oz (122.1 kg)   BMI 36.51 kg/m     Wt Readings from Last 3 Encounters:  10/11/16 269 lb 3.2 oz (122.1 kg)  06/30/16 272 lb (123.4 kg)  06/15/16 270 lb 1.6 oz (122.5 kg)     GEN:  Well nourished, well developed in no acute distress HEENT: Normal NECK: No JVD; No carotid bruits LYMPHATICS: No lymphadenopathy CARDIAC: RRR, no murmurs, rubs, loud S4, no S3 RESPIRATORY:  Clear to auscultation without rales, wheezing or rhonchi  ABDOMEN: Soft, non-tender, non-distended MUSCULOSKELETAL:  No edema; No deformity  SKIN: Warm and dry NEUROLOGIC:  Alert and oriented x 3 PSYCHIATRIC:  Normal affect   ASSESSMENT:    1. Hypertensive heart disease with chronic systolic congestive heart failure ([place])   2. Malignant hypertension   3. CRI (chronic renal insufficiency), stage 5 (HCC)    PLAN:    In order of problems listed above:  1. CHF: Essential clinical improvement, NYHA functional class I-II, no evidence of hypervolemia. Repeat echocardiogram. Suspect we  Will see improved LV systolic function. If there is no significant improvement in LVEF, may have to consider workup for coronary insufficiency, although this appears less likely cause for his heart failure. 2. HTN: blood pressure control remains inadequate, especially diastolic blood pressure elevation is of concern. Increase carvedilol to 37.5 mg twice daily. Bring him back in a few weeks and increase the dose to 50 mg twice daily if necessary. If this is also insufficient for blood pressure control, may need to consider minoxidil. 3. CKD:  Remarkable improvement in renal function and reduction in proteinuria with better blood pressure control, followed by Dr.  Lorrene Reid at [facility].   Medication Adjustments/Labs and Tests Ordered: Current medicines are reviewed at length with the patient today.  Concerns regarding medicines are outlined above.  Orders Placed This Encounter  Procedures  . ECHOCARDIOGRAM COMPLETE   Meds ordered this encounter  Medications  . carvedilol (COREG) 25 MG tablet    Sig: Take 1.5 tablets (37.5 mg total) by mouth 2 (two) times daily with a meal.    Dispense:  90 tablet    Refill:  11    Signed, Sanda Klein, MD  10/14/2016 10:36 AM    [place]

## 2016-10-11 NOTE — Patient Instructions (Addendum)
Medication Instructions: Dr Sallyanne Kuster has recommended making the following medication changes: 1. INCREASE Carvedilol to 37.5 mg - take 1.5 tablets by mouth twice daily  Labwork: NONE ORDERED  Testing/Procedures: 1. Echocardiogram - Your physician has requested that you have an echocardiogram. Echocardiography is a painless test that uses sound waves to create images of your heart. It provides your doctor with information about the size and shape of your heart and how well your heart's chambers and valves are working. This procedure takes approximately one hour. There are no restrictions for this procedure. This will be performed at our [facility] location - [address], [address]  Follow-up: Your physician recommends that you schedule a follow-up appointment in 2-3 weeks with Kerin Ransom, PA.  Dr Sallyanne Kuster recommends that you schedule a follow-up appointment in 3-4 months.  If you need a refill on your cardiac medications before your next appointment, please call your pharmacy.    Dr C would like for you to follow a low salt diet and monitor your weight daily. Please use the materials provided.

## 2016-10-14 DIAGNOSIS — I10 Essential (primary) hypertension: Secondary | ICD-10-CM | POA: Insufficient documentation

## 2016-10-19 ENCOUNTER — Other Ambulatory Visit (HOSPITAL_COMMUNITY): Payer: BLUE CROSS/BLUE SHIELD

## 2016-11-10 ENCOUNTER — Telehealth (HOSPITAL_COMMUNITY): Payer: Self-pay | Admitting: Cardiovascular Disease

## 2016-11-10 NOTE — Telephone Encounter (Signed)
Patient has cancelled an appt on 6/20 and no-showed appts on 7/2,7/18,8/7. A message was left for the patient to CB to r/s appt on 10/26/16.  He will be removed from the workqueue.

## 2016-11-11 ENCOUNTER — Encounter: Payer: Self-pay | Admitting: Cardiology

## 2016-11-11 ENCOUNTER — Ambulatory Visit (INDEPENDENT_AMBULATORY_CARE_PROVIDER_SITE_OTHER): Payer: BLUE CROSS/BLUE SHIELD | Admitting: Cardiology

## 2016-11-11 VITALS — BP 126/82 | HR 72 | Ht 72.0 in | Wt 263.4 lb

## 2016-11-11 DIAGNOSIS — I110 Hypertensive heart disease with heart failure: Secondary | ICD-10-CM

## 2016-11-11 DIAGNOSIS — I428 Other cardiomyopathies: Secondary | ICD-10-CM | POA: Diagnosis not present

## 2016-11-11 DIAGNOSIS — I5022 Chronic systolic (congestive) heart failure: Secondary | ICD-10-CM

## 2016-11-11 DIAGNOSIS — N185 Chronic kidney disease, stage 5: Secondary | ICD-10-CM

## 2016-11-11 DIAGNOSIS — I5041 Acute combined systolic (congestive) and diastolic (congestive) heart failure: Secondary | ICD-10-CM

## 2016-11-11 NOTE — Assessment & Plan Note (Signed)
Followed by Dr Lorrene Reid. SCr was 6.3 on 06/21/16, this improved according to office notes (3.27 per Dr Loletha Grayer in July)

## 2016-11-11 NOTE — Patient Instructions (Signed)
Medication Instructions: Your physician recommends that you continue on your current medications as directed. Please refer to the Current Medication list given to you today.  If you need a refill on your cardiac medications before your next appointment, please call your pharmacy.    Procedures/Testing: Your physician has requested that you have an echocardiogram. Echocardiography is a painless test that uses sound waves to create images of your heart. It provides your doctor with information about the size and shape of your heart and how well your heart's chambers and valves are working. This procedure takes approximately one hour. There are no restrictions for this procedure.    Follow-Up: Please keep your follow up appointment with Dr. Sallyanne Kuster on 01/17/17  Thank you for choosing Heart[facility] at [facility]!!

## 2016-11-11 NOTE — Assessment & Plan Note (Signed)
EF 35% with diffuse HK by echo March 2018

## 2016-11-11 NOTE — Assessment & Plan Note (Signed)
Admitted with HTN urgency and CHF 06/10/16-06/15/16

## 2016-11-11 NOTE — Assessment & Plan Note (Signed)
Grade 2 DD on echo

## 2016-11-11 NOTE — Progress Notes (Signed)
11/11/2016 Nathan Campbell   Jul 29, 1975  [id number]  Primary Physician Patient, No Pcp Per Primary Cardiologist: Dr Nathan Campbell  HPI:  40 y/o AA male with HTN was admitted UC 06/10/16 after presenting to Urgent Care with there DOE and elevated BP. On arrival at [facility] his BP was 203/136 . He was in acute CHF. He was admitted and diuresed. His B/P was brought under better control. Echo showed an EF of 35% with global HK and grade 2 DD. It was presumed he had a NICM, no ischemic work up planned. He was also seen by the renal service for acute on chronic renal insufficiency. He has seen Dr Nathan Campbell in f/u- his SCr was 6.3 on 4/9.   He saw Dr Nathan Campbell in July and apparently his SCr had come down to 3. The pt tells me he missed his LOV with Dr Nathan Campbell. He has done well, he is taking his medications. He denies any unusual SOB.    Current Outpatient Prescriptions  Medication Sig Dispense Refill  . amLODipine (NORVASC) 10 MG tablet Take 1 tablet (10 mg total) by mouth daily. 30 tablet 1  . calcitRIOL (ROCALTROL) 0.25 MCG capsule Take 1 capsule (0.25 mcg total) by mouth daily. 30 capsule 0  . calcium elemental as carbonate (BARIATRIC TUMS ULTRA) 400 MG chewable tablet Chew 1,000 mg by mouth 3 (three) times daily.    . carvedilol (COREG) 25 MG tablet Take 1.5 tablets (37.5 mg total) by mouth 2 (two) times daily with a meal. 90 tablet 11  . furosemide (LASIX) 80 MG tablet Take 1 tablet (80 mg total) by mouth 2 (two) times daily. 60 tablet 1  . hydrALAZINE (APRESOLINE) 100 MG tablet Take 1 tablet (100 mg total) by mouth every 8 (eight) hours. 90 tablet 1  . isosorbide mononitrate (IMDUR) 60 MG 24 hr tablet Take 1 tablet (60 mg total) by mouth daily. 30 tablet 1   No current facility-administered medications for this visit.     No Known Allergies  Past Medical History:  Diagnosis Date  . Acute combined systolic and diastolic CHF, NYHA class 4 ([place]) 06/10/2016  . Cardiomyopathy ([place]) 06/14/2016  . CHF  (congestive heart failure) ([place])   . Dyspnea   . Hypertension   . Hypertensive heart and kidney disease with heart failure ([place]) 06/14/2016  . Hypertensive heart disease with congestive heart failure [facility])     Social History   Social History  . Marital status: Single    Spouse name: N/A  . Number of children: N/A  . Years of education: N/A   Occupational History  . Not on file.   Social History Main Topics  . Smoking status: Former Smoker    Packs/day: 0.50    Years: 5.00    Types: Cigarettes  . Smokeless tobacco: Never Used  . Alcohol use Yes     Comment: socially  . Drug use: No  . Sexual activity: Not on file   Other Topics Concern  . Not on file   Social History Narrative   Epworth Sleepiness Score: 6     Family History  Problem Relation Age of Onset  . Diabetes Father   . Hypertension Father   . Heart disease Cousin   . Heart disease Cousin   . Hypertension Mother   . Hypertension Sister   . Hypertension Paternal Grandmother   . Diabetes Mellitus II Paternal Grandfather      Review of Systems: General: negative for chills, fever, night sweats  or weight changes.  Cardiovascular: negative for chest pain, dyspnea on exertion, edema, orthopnea, palpitations, paroxysmal nocturnal dyspnea or shortness of breath Dermatological: negative for rash Respiratory: negative for cough or wheezing Urologic: negative for hematuria Abdominal: negative for nausea, vomiting, diarrhea, bright red blood per rectum, melena, or hematemesis Neurologic: negative for visual changes, syncope, or dizziness All other systems reviewed and are otherwise negative except as noted above.    Blood pressure 126/82, pulse 72, height 6' (1.829 m), weight 263 lb 6.4 oz (119.5 kg).  General appearance: alert, cooperative, no distress and moderately obese Lungs: clear to auscultation bilaterally Heart: regular rate and rhythm Extremities: no edema Neurologic: Grossly  normal   ASSESSMENT AND PLAN:   NICM (nonischemic cardiomyopathy) (HCC) EF 35% with diffuse HK by echo March 2018  Acute combined systolic and diastolic CHF, NYHA class 4 ([place]) Admitted with HTN urgency and CHF 06/10/16-06/15/16  CRI (chronic renal insufficiency), stage 5 ([place]) Followed by Dr Nathan Campbell. SCr was 6.3 on 06/21/16, this improved according to office notes (3.27 per Dr Nathan Campbell in July)  Hypertensive heart disease with CHF (congestive heart failure) ([place]) Grade 2 DD on echo   PLAN  I discussed HTN and CRI with Nathan Campbell and explained that this is something we will have to work on together from now on, not something that will get better and just go away. I explained that he will be on medications for the foreseeable future and that our goal now is to keep him off dialysis, out of the hospital, and see if if heart function doesn't improve somewhat. I explained that he needs to keep his appointments. He missed an appointment with Dr Nathan Campbell and cancelled his appointment for his echo. I'm not sure he had a good understanding of his situation. I will reschedule his echo. We;ll need to consider an ICD if his EF is less than 35%.   Nathan Ransom PA-C 11/11/2016 4:52 PM

## 2016-11-19 ENCOUNTER — Other Ambulatory Visit (HOSPITAL_COMMUNITY): Payer: BLUE CROSS/BLUE SHIELD

## 2016-11-24 ENCOUNTER — Telehealth (HOSPITAL_COMMUNITY): Payer: Self-pay | Admitting: Cardiology

## 2016-11-24 NOTE — Telephone Encounter (Signed)
Patient has no-showed for appts on 7/2,7/18, and 9/7. He will be removed from the workqueue.

## 2016-12-01 ENCOUNTER — Ambulatory Visit (HOSPITAL_COMMUNITY): Payer: BLUE CROSS/BLUE SHIELD | Attending: Cardiovascular Disease

## 2016-12-01 ENCOUNTER — Other Ambulatory Visit: Payer: Self-pay

## 2016-12-01 DIAGNOSIS — I110 Hypertensive heart disease with heart failure: Secondary | ICD-10-CM | POA: Diagnosis not present

## 2016-12-01 DIAGNOSIS — I5022 Chronic systolic (congestive) heart failure: Secondary | ICD-10-CM

## 2016-12-01 DIAGNOSIS — I428 Other cardiomyopathies: Secondary | ICD-10-CM | POA: Insufficient documentation

## 2016-12-01 DIAGNOSIS — Z87891 Personal history of nicotine dependence: Secondary | ICD-10-CM | POA: Diagnosis not present

## 2016-12-01 LAB — ECHOCARDIOGRAM COMPLETE
Ao-asc: 38 cm
CHL CUP DOP CALC LVOT VTI: 27 cm
CHL CUP MV DEC (S): 173
E/e' ratio: 7.18
EWDT: 173 ms
FS: 17 % — AB (ref 28–44)
IVS/LV PW RATIO, ED: 0.9
LA ID, A-P, ES: 50 mm
LA vol: 59.2 mL
LADIAMINDEX: 2 cm/m2
LAVOLA4C: 58 mL
LAVOLIN: 23.6 mL/m2
LDCA: 3.8 cm2
LEFT ATRIUM END SYS DIAM: 50 mm
LV E/e'average: 7.18
LV PW d: 19.2 mm — AB (ref 0.6–1.1)
LV TDI E'LATERAL: 9.6
LV TDI E'MEDIAL: 5.85
LV e' LATERAL: 9.6 cm/s
LVEEMED: 7.18
LVOT SV: 103 mL
LVOT peak grad rest: 7 mmHg
LVOT peak vel: 133 cm/s
LVOTD: 22 mm
Lateral S' vel: 13.3 cm/s
MV pk E vel: 68.9 m/s
MVPKAVEL: 63 m/s
TAPSE: 19.7 mm

## 2016-12-27 DIAGNOSIS — R809 Proteinuria, unspecified: Secondary | ICD-10-CM | POA: Diagnosis not present

## 2016-12-27 DIAGNOSIS — N2581 Secondary hyperparathyroidism of renal origin: Secondary | ICD-10-CM | POA: Diagnosis not present

## 2016-12-27 DIAGNOSIS — D631 Anemia in chronic kidney disease: Secondary | ICD-10-CM | POA: Diagnosis not present

## 2016-12-27 DIAGNOSIS — I120 Hypertensive chronic kidney disease with stage 5 chronic kidney disease or end stage renal disease: Secondary | ICD-10-CM | POA: Diagnosis not present

## 2016-12-27 DIAGNOSIS — N185 Chronic kidney disease, stage 5: Secondary | ICD-10-CM | POA: Diagnosis not present

## 2017-01-03 ENCOUNTER — Telehealth: Payer: Self-pay | Admitting: Cardiovascular Disease

## 2017-01-03 NOTE — Telephone Encounter (Signed)
Received records from [facility] for appointment on 01/17/17 with Dr Sallyanne Kuster.  Records put with Dr Croitoru's schedule for 01/17/17. lp

## 2017-01-17 ENCOUNTER — Ambulatory Visit: Payer: BLUE CROSS/BLUE SHIELD | Admitting: Cardiovascular Disease

## 2017-01-17 ENCOUNTER — Encounter: Payer: Self-pay | Admitting: *Deleted

## 2017-05-12 DIAGNOSIS — I129 Hypertensive chronic kidney disease with stage 1 through stage 4 chronic kidney disease, or unspecified chronic kidney disease: Secondary | ICD-10-CM | POA: Diagnosis not present

## 2017-05-12 DIAGNOSIS — N189 Chronic kidney disease, unspecified: Secondary | ICD-10-CM | POA: Diagnosis not present

## 2017-05-12 DIAGNOSIS — N2581 Secondary hyperparathyroidism of renal origin: Secondary | ICD-10-CM | POA: Diagnosis not present

## 2017-05-12 DIAGNOSIS — N184 Chronic kidney disease, stage 4 (severe): Secondary | ICD-10-CM | POA: Diagnosis not present

## 2017-05-12 DIAGNOSIS — N185 Chronic kidney disease, stage 5: Secondary | ICD-10-CM | POA: Diagnosis not present

## 2017-05-12 DIAGNOSIS — D631 Anemia in chronic kidney disease: Secondary | ICD-10-CM | POA: Diagnosis not present

## 2017-05-12 DIAGNOSIS — R809 Proteinuria, unspecified: Secondary | ICD-10-CM | POA: Diagnosis not present

## 2017-05-13 ENCOUNTER — Observation Stay (HOSPITAL_COMMUNITY)
Admission: EM | Admit: 2017-05-13 | Discharge: 2017-05-15 | Disposition: A | Discharge status: Home / Self Care | Payer: BLUE CROSS/BLUE SHIELD | Attending: Internal Medicine | Admitting: Internal Medicine

## 2017-05-13 ENCOUNTER — Other Ambulatory Visit: Payer: Self-pay

## 2017-05-13 ENCOUNTER — Encounter (HOSPITAL_COMMUNITY): Payer: Self-pay

## 2017-05-13 ENCOUNTER — Emergency Department (HOSPITAL_COMMUNITY): Payer: BLUE CROSS/BLUE SHIELD

## 2017-05-13 DIAGNOSIS — R9431 Abnormal electrocardiogram [ECG] [EKG]: Secondary | ICD-10-CM

## 2017-05-13 DIAGNOSIS — I428 Other cardiomyopathies: Secondary | ICD-10-CM

## 2017-05-13 DIAGNOSIS — R0602 Shortness of breath: Secondary | ICD-10-CM | POA: Diagnosis not present

## 2017-05-13 DIAGNOSIS — Z87891 Personal history of nicotine dependence: Secondary | ICD-10-CM | POA: Diagnosis not present

## 2017-05-13 DIAGNOSIS — I5042 Chronic combined systolic (congestive) and diastolic (congestive) heart failure: Secondary | ICD-10-CM | POA: Diagnosis not present

## 2017-05-13 DIAGNOSIS — I10 Essential (primary) hypertension: Secondary | ICD-10-CM | POA: Diagnosis present

## 2017-05-13 DIAGNOSIS — R05 Cough: Secondary | ICD-10-CM | POA: Diagnosis not present

## 2017-05-13 DIAGNOSIS — R0789 Other chest pain: Secondary | ICD-10-CM | POA: Diagnosis not present

## 2017-05-13 DIAGNOSIS — Z8249 Family history of ischemic heart disease and other diseases of the circulatory system: Secondary | ICD-10-CM | POA: Insufficient documentation

## 2017-05-13 DIAGNOSIS — R079 Chest pain, unspecified: Secondary | ICD-10-CM | POA: Diagnosis not present

## 2017-05-13 DIAGNOSIS — Z79899 Other long term (current) drug therapy: Secondary | ICD-10-CM | POA: Insufficient documentation

## 2017-05-13 DIAGNOSIS — I132 Hypertensive heart and chronic kidney disease with heart failure and with stage 5 chronic kidney disease, or end stage renal disease: Secondary | ICD-10-CM | POA: Insufficient documentation

## 2017-05-13 DIAGNOSIS — N184 Chronic kidney disease, stage 4 (severe): Secondary | ICD-10-CM | POA: Diagnosis not present

## 2017-05-13 DIAGNOSIS — R509 Fever, unspecified: Secondary | ICD-10-CM | POA: Diagnosis not present

## 2017-05-13 LAB — I-STAT TROPONIN, ED
TROPONIN I, POC: 0.05 ng/mL (ref 0.00–0.08)
Troponin i, poc: 0.02 ng/mL (ref 0.00–0.08)

## 2017-05-13 LAB — BASIC METABOLIC PANEL
Anion gap: 11 (ref 5–15)
BUN: 34 mg/dL — ABNORMAL HIGH (ref 6–20)
CO2: 23 mmol/L (ref 22–32)
CREATININE: 2.73 mg/dL — AB (ref 0.61–1.24)
Calcium: 9 mg/dL (ref 8.9–10.3)
Chloride: 105 mmol/L (ref 101–111)
GFR calc Af Amer: 32 mL/min — ABNORMAL LOW (ref >60)
GFR, EST NON AFRICAN AMERICAN: 28 mL/min — AB (ref >60)
Glucose, Bld: 106 mg/dL — ABNORMAL HIGH (ref 65–99)
POTASSIUM: 3.8 mmol/L (ref 3.5–5.1)
SODIUM: 139 mmol/L (ref 135–145)

## 2017-05-13 LAB — CBC
HEMATOCRIT: 39.4 % (ref 39.0–52.0)
Hemoglobin: 13 g/dL (ref 13.0–17.0)
MCH: 31.1 pg (ref 26.0–34.0)
MCHC: 33 g/dL (ref 30.0–36.0)
MCV: 94.3 fL (ref 78.0–100.0)
PLATELETS: 225 10*3/uL (ref 150–400)
RBC: 4.18 MIL/uL — ABNORMAL LOW (ref 4.22–5.81)
RDW: 13.2 % (ref 11.5–15.5)
WBC: 6.7 10*3/uL (ref 4.0–10.5)

## 2017-05-13 NOTE — ED Notes (Signed)
No response when called for vitals.  

## 2017-05-13 NOTE — ED Triage Notes (Signed)
Pt presents to the ed with complaints of chest pain, shortness of breath, congestion and cough x 3 days. NAD in triage.

## 2017-05-13 NOTE — ED Provider Notes (Signed)
[facility] EMERGENCY DEPARTMENT Provider Note   CSN: [id number] Arrival date & time: 05/13/17  1631     History   Chief Complaint Chief Complaint  Patient presents with  . Chest Pain    HPI Nathan Campbell is a 42 y.o. male.  The history is provided by the patient and medical records.    42 year old male with history of congestive heart failure, cardiomyopathy, hypertension, hypertensive heart and kidney disease, presenting to the ED with episode of chest pain.  Patient reports for the past 3 days he has been experiencing productive cough and some intermittent shortness of breath with subjective fever.  He has been on his nephews who have both been sick with colds.  States today he was sitting at home and felt a "twinge" in the left side of his chest that was sharp and lasted about 30 seconds.  States there resolved spontaneously but scared him so he wanted to get evaluated.  States he has had issues with heart failure in the past but that seems to have regulated since starting new medications, he has been compliant with them including his Lasix.  He denies any significant weight gain, nighttime orthopnea, or increased lower extremity edema.  He has no history of coronary artery disease.  He is a former smoker.  Past Medical History:  Diagnosis Date  . Acute combined systolic and diastolic CHF, NYHA class 4 ([place]) 06/10/2016  . Cardiomyopathy ([place]) 06/14/2016  . CHF (congestive heart failure) ([place])   . Dyspnea   . Hypertension   . Hypertensive heart and kidney disease with heart failure ([place]) 06/14/2016  . Hypertensive heart disease with congestive heart failure [facility])     Patient Active Problem List   Diagnosis Date Noted  . Malignant hypertension 10/14/2016  . Hypertensive heart disease with CHF (congestive heart failure) ([place]) 06/14/2016  . NICM (nonischemic cardiomyopathy) ([place]) 06/14/2016  . Renal failure   . Hypertensive urgency 06/10/2016  . CRI (chronic  renal insufficiency), stage 5 (H[facility]) 06/10/2016  . Elevated troponin 06/10/2016  . Elevated transaminase level 06/10/2016  . Normocytic anemia 06/10/2016    Past Surgical History:  Procedure Laterality Date  . NO PAST SURGERIES         Home Medications    Prior to Admission medications   Medication Sig Start Date End Date Taking? Authorizing Provider  amLODipine (NORVASC) 10 MG tablet Take 1 tablet (10 mg total) by mouth daily. 06/16/16   Verlee Monte, MD  calcitRIOL (ROCALTROL) 0.25 MCG capsule Take 1 capsule (0.25 mcg total) by mouth daily. 06/16/16   Verlee Monte, MD  calcium elemental as carbonate (BARIATRIC TUMS ULTRA) 400 MG chewable tablet Chew 1,000 mg by mouth 3 (three) times daily.    [provider]  carvedilol (COREG) 25 MG tablet Take 1.5 tablets (37.5 mg total) by mouth 2 (two) times daily with a meal. 10/11/16   Croitoru, Mihai, MD  furosemide (LASIX) 80 MG tablet Take 1 tablet (80 mg total) by mouth 2 (two) times daily. 06/15/16   Verlee Monte, MD  hydrALAZINE (APRESOLINE) 100 MG tablet Take 1 tablet (100 mg total) by mouth every 8 (eight) hours. 06/15/16   Verlee Monte, MD  isosorbide mononitrate (IMDUR) 60 MG 24 hr tablet Take 1 tablet (60 mg total) by mouth daily. 06/16/16   Verlee Monte, MD    Family History Family History  Problem Relation Age of Onset  . Diabetes Father   . Hypertension Father   . Heart disease  Cousin   . Heart disease Cousin   . Hypertension Mother   . Hypertension Sister   . Hypertension Paternal Grandmother   . Diabetes Mellitus II Paternal Grandfather     Social History Social History   Tobacco Use  . Smoking status: Former Smoker    Packs/day: 0.50    Years: 5.00    Pack years: 2.50    Types: Cigarettes  . Smokeless tobacco: Never Used  Substance Use Topics  . Alcohol use: Yes    Comment: socially  . Drug use: No     Allergies   Patient has no known allergies.   Review of Systems Review of Systems    Constitutional: Positive for fever (subjective).  Respiratory: Positive for cough and shortness of breath.   Cardiovascular: Positive for chest pain.  All other systems reviewed and are negative.    Physical Exam Updated Vital Signs BP (!) 155/93   Pulse 82   Temp 99.3 F (37.4 C)   Resp (!) 23   Wt 131.5 kg (290 lb)   SpO2 95%   BMI 39.33 kg/m   Physical Exam  Constitutional: He is oriented to person, place, and time. He appears well-developed and well-nourished.  Obese, NAD  HENT:  Head: Normocephalic and atraumatic.  Mouth/Throat: Oropharynx is clear and moist.  Eyes: Conjunctivae and EOM are normal. Pupils are equal, round, and reactive to light.  Neck: Normal range of motion.  Cardiovascular: Normal rate, regular rhythm and normal heart sounds.  Pulmonary/Chest: Effort normal and breath sounds normal. He has no decreased breath sounds. He has no wheezes.  Abdominal: Soft. Bowel sounds are normal.  Musculoskeletal: Normal range of motion.  Neurological: He is alert and oriented to person, place, and time.  Skin: Skin is warm and dry.  Psychiatric: He has a normal mood and affect.  Nursing note and vitals reviewed.    ED Treatments / Results  Labs (all labs ordered are listed, but only abnormal results are displayed) Labs Reviewed  BASIC METABOLIC PANEL - Abnormal; Notable for the following components:      Result Value   Glucose, Bld 106 (*)    BUN 34 (*)    Creatinine, Ser 2.73 (*)    GFR calc non Af Amer 28 (*)    GFR calc Af Amer 32 (*)    All other components within normal limits  CBC - Abnormal; Notable for the following components:   RBC 4.18 (*)    All other components within normal limits  I-STAT TROPONIN, ED  I-STAT TROPONIN, ED    EKG  EKG Interpretation  Date/Time:  Friday May 13 2017 23:01:09 EST Ventricular Rate:  86 PR Interval:    QRS Duration: 78 QT Interval:  373 QTC Calculation: 447 R Axis:   15 Text Interpretation:  Sinus  rhythm Left atrial enlargement new Abnormal T, consider ischemia, lateral leads Minimal ST elevation, anterior leads Confirmed by Blanchie Dessert [id number]) on 05/13/2017 11:32:50 PM       Radiology Dg Chest 2 View  Result Date: 05/13/2017 CLINICAL DATA:  Short of breath and chest congestion EXAM: CHEST  2 VIEW COMPARISON:  06/14/2016 FINDINGS: Mild cardiomegaly. No focal infiltrate or effusion. No pneumothorax. IMPRESSION: No active cardiopulmonary disease.  Cardiomegaly. Electronically Signed   By: Donavan Foil M.D.   On: 05/13/2017 17:39    Procedures Procedures (including critical care time)  Medications Ordered in ED Medications - No data to display   Initial Impression / Assessment and  Plan / ED Course  I have reviewed the triage vital signs and the nursing notes.  Pertinent labs & imaging results that were available during my care of the patient were reviewed by me and considered in my medical decision making (see chart for details).  42 y.o. M here with new chest pain today.  Reports some cough and congestion over the past 3 days, nephews have been sick as well.  He is afebrile, non-toxic.  Lungs are overall clear.  He is in NAD.  Initial labs reassuring.  Renal function today has actually improved from prior values.  CXR with cardiomegaly but no vascular congestion or pulmonary infiltrate.  Patient's initial EKG has not crossed over, so was repeated and does show new t-wave inversions laterally.  Delta troponin was sent and has increased from prior but remains negative (0.2 initially to 0.5 on repeat).  Given new chest pain today and new EKG changes, will admit for cardiac r/o.  Discussed with Dr. Myna Hidalgo-- he will admit for ongoing care.  Final Clinical Impressions(s) / ED Diagnoses   Final diagnoses:  Chest pain, unspecified type  EKG abnormalities    ED Discharge Orders    None       Larene Pickett, PA-C 05/14/17 Iafrate Mince, MD 05/14/17 [phone]

## 2017-05-14 ENCOUNTER — Encounter (HOSPITAL_COMMUNITY): Payer: Self-pay | Admitting: Family Medicine

## 2017-05-14 DIAGNOSIS — I10 Essential (primary) hypertension: Secondary | ICD-10-CM | POA: Diagnosis not present

## 2017-05-14 DIAGNOSIS — I428 Other cardiomyopathies: Secondary | ICD-10-CM | POA: Diagnosis not present

## 2017-05-14 DIAGNOSIS — R079 Chest pain, unspecified: Secondary | ICD-10-CM | POA: Diagnosis not present

## 2017-05-14 DIAGNOSIS — R9431 Abnormal electrocardiogram [ECG] [EKG]: Secondary | ICD-10-CM

## 2017-05-14 DIAGNOSIS — N184 Chronic kidney disease, stage 4 (severe): Secondary | ICD-10-CM

## 2017-05-14 DIAGNOSIS — I132 Hypertensive heart and chronic kidney disease with heart failure and with stage 5 chronic kidney disease, or end stage renal disease: Secondary | ICD-10-CM | POA: Diagnosis not present

## 2017-05-14 LAB — TROPONIN I
TROPONIN I: 0.04 ng/mL — AB (ref <0.03)
TROPONIN I: 0.04 ng/mL — AB (ref <0.03)
Troponin I: 0.04 ng/mL (ref <0.03)

## 2017-05-14 MED ORDER — HEPARIN SODIUM (PORCINE) 5000 UNIT/ML IJ SOLN
5000.0000 [IU] | Freq: Three times a day (TID) | INTRAMUSCULAR | Status: DC
  Administered 2017-05-14 – 2017-05-15 (×4): 5000 [IU] via SUBCUTANEOUS
  Filled 2017-05-14 (×4): qty 1

## 2017-05-14 MED ORDER — AMLODIPINE BESYLATE 5 MG PO TABS
10.0000 mg | ORAL_TABLET | Freq: Every day | ORAL | Status: DC
  Administered 2017-05-14 – 2017-05-15 (×2): 10 mg via ORAL
  Filled 2017-05-14 (×2): qty 2

## 2017-05-14 MED ORDER — ASPIRIN EC 81 MG PO TBEC
81.0000 mg | DELAYED_RELEASE_TABLET | Freq: Every day | ORAL | Status: DC
  Administered 2017-05-15: 81 mg via ORAL
  Filled 2017-05-14: qty 1

## 2017-05-14 MED ORDER — HYDRALAZINE HCL 50 MG PO TABS
100.0000 mg | ORAL_TABLET | Freq: Three times a day (TID) | ORAL | Status: DC
  Administered 2017-05-14 – 2017-05-15 (×4): 100 mg via ORAL
  Filled 2017-05-14 (×5): qty 2

## 2017-05-14 MED ORDER — NITROGLYCERIN 0.4 MG SL SUBL: 0.4000 mg | SUBLINGUAL_TABLET | SUBLINGUAL | Status: DC | PRN

## 2017-05-14 MED ORDER — CALCIUM CARBONATE ANTACID 500 MG PO CHEW
1000.0000 mg | CHEWABLE_TABLET | Freq: Three times a day (TID) | ORAL | Status: DC
  Administered 2017-05-14 – 2017-05-15 (×4): 1000 mg via ORAL
  Filled 2017-05-14 (×5): qty 5

## 2017-05-14 MED ORDER — ONDANSETRON HCL 4 MG/2ML IJ SOLN: 4.0000 mg | Freq: Four times a day (QID) | INTRAMUSCULAR | Status: DC | PRN

## 2017-05-14 MED ORDER — ISOSORBIDE MONONITRATE ER 30 MG PO TB24
60.0000 mg | ORAL_TABLET | Freq: Every day | ORAL | Status: DC
  Administered 2017-05-14 – 2017-05-15 (×2): 60 mg via ORAL
  Filled 2017-05-14 (×2): qty 2

## 2017-05-14 MED ORDER — LABETALOL HCL 5 MG/ML IV SOLN: 5.0000 mg | INTRAVENOUS | Status: DC | PRN

## 2017-05-14 MED ORDER — FUROSEMIDE 20 MG PO TABS
80.0000 mg | ORAL_TABLET | Freq: Two times a day (BID) | ORAL | Status: DC
Start: 2017-05-14 — End: 2017-05-15
  Administered 2017-05-14 – 2017-05-15 (×3): 80 mg via ORAL
  Filled 2017-05-14 (×3): qty 4

## 2017-05-14 MED ORDER — ASPIRIN 81 MG PO CHEW
324.0000 mg | CHEWABLE_TABLET | Freq: Once | ORAL | Status: AC
Start: 2017-05-14 — End: 2017-05-14
  Administered 2017-05-14: 324 mg via ORAL
  Filled 2017-05-14: qty 4

## 2017-05-14 MED ORDER — ACETAMINOPHEN 325 MG PO TABS: 650.0000 mg | ORAL_TABLET | ORAL | Status: DC | PRN

## 2017-05-14 MED ORDER — CARVEDILOL 12.5 MG PO TABS
37.5000 mg | ORAL_TABLET | Freq: Two times a day (BID) | ORAL | Status: DC
  Administered 2017-05-14 – 2017-05-15 (×3): 37.5 mg via ORAL
  Filled 2017-05-14 (×3): qty 3

## 2017-05-14 MED ORDER — CALCITRIOL 0.25 MCG PO CAPS
0.2500 ug | ORAL_CAPSULE | Freq: Every day | ORAL | Status: DC
  Administered 2017-05-14 – 2017-05-15 (×2): 0.25 ug via ORAL
  Filled 2017-05-14 (×2): qty 1

## 2017-05-14 NOTE — Progress Notes (Signed)
Pt. With Troponin of 0.04. On call for [facility] paged to make aware.

## 2017-05-14 NOTE — Progress Notes (Signed)
42 year old male admitted early this morning with chest pain.  Patient has history of nonischemic cardiomyopathy ejection fraction 50-55% on echo September 2018.  His troponin is within normal limits with hold T wave abnormality.  He has not had any further chest pain when I saw him he was comfortable resting in bed.  Pain addition to that patient has history of CKD stage IV.  We will not repeat echocardiogram at this time.  We will monitor him on telemetry and discuss with cardiology.

## 2017-05-14 NOTE — H&P (Signed)
History and Physical    Nathan Campbell CVE:[id number] DOB: 1975/10/17 DOA: 05/13/2017  PCP: Lucianne Lei, MD   Patient coming from: Home  Chief Complaint: Chest pain   HPI: Nathan Campbell is a 42 y.o. male with medical history significant for chronic kidney disease stage IV, nonischemic cardiomyopathy, and hypertension, now presenting to the emergency department for evaluation of chest pain.  Patient reports that he had been in his usual state of health until earlier today when he developed acute onset of left-sided chest pain with minimal exertion.  His pain was described as severe, localized to the left chest, achy in character, resolved with rest, and then recurred when he became active again.  He had never experienced similar symptoms previously and there has not been a recurrence.  But denies recent fevers or chills.  Reports mild cough and exertional dyspnea for the past couple days.  ED Course: Upon arrival to the ED, patient is found to be afebrile, saturating well on room air, slightly tachycardic, and mildly hypertensive.  EKG features a sinus rhythm with diffuse T wave abnormalities that are similar to prior.  Chest x-ray is notable for cardiomegaly, but no acute cardiopulmonary disease.  Chemistry panel is notable for a creatinine of 2.73, much improved from priors.  CBC is unremarkable, initial troponin is 0.02, and second troponin is also within the normal limits at 0.05.  Patient was treated with 324 mg of aspirin in the ED.  He has remained hemodynamically stable, has not had recurrence in his chest pain, and will be observed on the telemetry unit for ongoing evaluation and management of chest pain.  Review of Systems:  All other systems reviewed and apart from HPI, are negative.  Past Medical History:  Diagnosis Date  . Acute combined systolic and diastolic CHF, NYHA class 4 ([place]) 06/10/2016  . Cardiomyopathy ([place]) 06/14/2016  . CHF (congestive heart failure) ([place])   . Dyspnea     . Hypertension   . Hypertensive heart and kidney disease with heart failure ([place]) 06/14/2016  . Hypertensive heart disease with congestive heart failure [facility])     Past Surgical History:  Procedure Laterality Date  . NO PAST SURGERIES       reports that he has quit smoking. His smoking use included cigarettes. He has a 2.50 pack-year smoking history. he has never used smokeless tobacco. He reports that he drinks alcohol. He reports that he does not use drugs.  No Known Allergies  Family History  Problem Relation Age of Onset  . Diabetes Father   . Hypertension Father   . Heart disease Cousin   . Heart disease Cousin   . Hypertension Mother   . Hypertension Sister   . Hypertension Paternal Grandmother   . Diabetes Mellitus II Paternal Grandfather      Prior to Admission medications   Medication Sig Start Date End Date Taking? Authorizing Provider  amLODipine (NORVASC) 10 MG tablet Take 1 tablet (10 mg total) by mouth daily. 06/16/16   Verlee Monte, MD  calcitRIOL (ROCALTROL) 0.25 MCG capsule Take 1 capsule (0.25 mcg total) by mouth daily. 06/16/16   Verlee Monte, MD  calcium elemental as carbonate (BARIATRIC TUMS ULTRA) 400 MG chewable tablet Chew 1,000 mg by mouth 3 (three) times daily.    [provider]  carvedilol (COREG) 25 MG tablet Take 1.5 tablets (37.5 mg total) by mouth 2 (two) times daily with a meal. 10/11/16   Croitoru, Mihai, MD  furosemide (LASIX) 80 MG tablet Take  1 tablet (80 mg total) by mouth 2 (two) times daily. 06/15/16   Verlee Monte, MD  hydrALAZINE (APRESOLINE) 100 MG tablet Take 1 tablet (100 mg total) by mouth every 8 (eight) hours. 06/15/16   Verlee Monte, MD  isosorbide mononitrate (IMDUR) 60 MG 24 hr tablet Take 1 tablet (60 mg total) by mouth daily. 06/16/16   Verlee Monte, MD    Physical Exam: Vitals:   05/13/17 2048 05/13/17 2130 05/13/17 2200 05/13/17 2230  BP: (!) 187/104 (!) 175/103 (!) 161/103 (!) 155/93  Pulse: (!) 104 86 88 82  Resp:  15 (!) 26 (!) 26 (!) 23  Temp:      SpO2: 100% 96% 98% 95%  Weight:          Constitutional: NAD, calm  Eyes: PERTLA, lids and conjunctivae normal ENMT: Mucous membranes are moist. Posterior pharynx clear of any exudate or lesions.   Neck: normal, supple, no masses, no thyromegaly Respiratory: clear to auscultation bilaterally, no wheezing, no crackles. Normal respiratory effort.  Cardiovascular: S1 & S2 heard, regular rate and rhythm. Trace bilateral LE edema. No significant JVD. Abdomen: No distension, no tenderness, no masses palpated. Bowel sounds normal.  Musculoskeletal: no clubbing / cyanosis. No joint deformity upper and lower extremities.    Skin: no significant rashes, lesions, ulcers. Warm, dry, well-perfused. Neurologic: CN 2-12 grossly intact. Sensation intact. Strength 5/5 in all 4 limbs.  Psychiatric: Alert and oriented x 3. Calm, cooperative.     Labs on Admission: I have personally reviewed following labs and imaging studies  CBC: Recent Labs  Lab 05/13/17 1653  WBC 6.7  HGB 13.0  HCT 39.4  MCV 94.3  PLT 546   Basic Metabolic Panel: Recent Labs  Lab 05/13/17 1653  NA 139  K 3.8  CL 105  CO2 23  GLUCOSE 106*  BUN 34*  CREATININE 2.73*  CALCIUM 9.0   GFR: CrCl cannot be calculated (Unknown ideal weight.). Liver Function Tests: No results for input(s): AST, ALT, ALKPHOS, BILITOT, PROT, ALBUMIN in the last 168 hours. No results for input(s): LIPASE, AMYLASE in the last 168 hours. No results for input(s): AMMONIA in the last 168 hours. Coagulation Profile: No results for input(s): INR, PROTIME in the last 168 hours. Cardiac Enzymes: No results for input(s): CKTOTAL, CKMB, CKMBINDEX, TROPONINI in the last 168 hours. BNP (last 3 results) No results for input(s): PROBNP in the last 8760 hours. HbA1C: No results for input(s): HGBA1C in the last 72 hours. CBG: No results for input(s): GLUCAP in the last 168 hours. Lipid Profile: No results for  input(s): CHOL, HDL, LDLCALC, TRIG, CHOLHDL, LDLDIRECT in the last 72 hours. Thyroid Function Tests: No results for input(s): TSH, T4TOTAL, FREET4, T3FREE, THYROIDAB in the last 72 hours. Anemia Panel: No results for input(s): VITAMINB12, FOLATE, FERRITIN, TIBC, IRON, RETICCTPCT in the last 72 hours. Urine analysis:    Component Value Date/Time   COLORURINE YELLOW 06/14/2016 1939   APPEARANCEUR CLEAR 06/14/2016 1939   LABSPEC 1.011 06/14/2016 1939   PHURINE 6.0 06/14/2016 1939   GLUCOSEU NEGATIVE 06/14/2016 1939   HGBUR NEGATIVE 06/14/2016 1939   BILIRUBINUR NEGATIVE 06/14/2016 1939   KETONESUR NEGATIVE 06/14/2016 1939   PROTEINUR 100 (A) 06/14/2016 1939   NITRITE NEGATIVE 06/14/2016 1939   LEUKOCYTESUR NEGATIVE 06/14/2016 1939   Sepsis Labs: @LABRCNTIP (procalcitonin:4,lacticidven:4) )No results found for this or any previous visit (from the past 240 hour(s)).   Radiological Exams on Admission: Dg Chest 2 View  Result Date: 05/13/2017 CLINICAL DATA:  Short of breath and chest congestion EXAM: CHEST  2 VIEW COMPARISON:  06/14/2016 FINDINGS: Mild cardiomegaly. No focal infiltrate or effusion. No pneumothorax. IMPRESSION: No active cardiopulmonary disease.  Cardiomegaly. Electronically Signed   By: Donavan Foil M.D.   On: 05/13/2017 17:39    EKG: Independently reviewed. Sinus rhythm, diffuse T-wave abnormality, similar to prior.   Assessment/Plan   1. Chest pain  - Presents after a brief episode of chest pain with minimal exertion that resolved with rest and then recurred x1 with mild exertion before resolving again spontaneously  - EKG features T-wave abnormality diffusely, similar to prior  - CXR with cardiomegaly, but no acute disease  - Troponin is wnl x2  - Treated in ED with ASA 324  - Continue cardiac monitoring, obtain serial troponin, repeat EKG, continue beta-blocker and nitrate    2. Nonischemic cardiomyopathy  - EF improved to 50-55% on echo from September 2018  -  Appears well-compensated on admission  - Continue Lasix, Coreg, hydralazine    3. Hypertension  - BP at goal  - Continue Coreg, Lasix, and hydralazine    4. CKD stage IV  - SCr is 2.74, better than priors  - Renally-dose medications, continue calcitriol and bicarbonate     DVT prophylaxis: sq heparin  Code Status: Full  Family Communication: Discussed with patient Disposition Plan: Observe on telemetry Consults called: None Admission status: Observation     Vianne Bulls, MD [facility] Pager [phone]  If 7PM-7AM, please contact night-coverage [email or web address] Password [email or web address]  05/14/2017, 1:44 AM

## 2017-05-14 NOTE — ED Notes (Signed)
Admitting MD at bedside.

## 2017-05-14 NOTE — Plan of Care (Signed)
Pt. Activity tolerance WNL. No CP noted at this time.

## 2017-05-15 DIAGNOSIS — R079 Chest pain, unspecified: Principal | ICD-10-CM

## 2017-05-15 MED ORDER — ACETAMINOPHEN 325 MG PO TABS: 650.0000 mg | ORAL_TABLET | ORAL | Status: DC | PRN

## 2017-05-15 MED ORDER — ASPIRIN 81 MG PO TBEC: 81.0000 mg | DELAYED_RELEASE_TABLET | Freq: Every day | ORAL | Status: DC

## 2017-05-15 NOTE — Progress Notes (Signed)
Discharge instruction given to patient. He verbalized understanding. All questions answered. Belongings with patient. Denies any pain or shortness of breath.

## 2017-05-15 NOTE — Discharge Summary (Signed)
Physician Discharge Summary  Nathan Campbell FIE:[id number] DOB: 08/05/1975 DOA: 05/13/2017  PCP: Lucianne Lei, MD  Admit date: 05/13/2017 Discharge date: 05/15/2017  Admitted From: Home Disposition: Home Recommendations for Outpatient Follow-up:  1. Follow up with PCP in 1-2 weeks 2. Please obtain BMP/CBC in one week  Home Health none Equipment/Devices: None  Discharge Condition: Stable CODE STATUS full code Diet recommendation: Cardiac renal diet Brief/Interim Summary:42 y.o. male with medical history significant for chronic kidney disease stage IV, nonischemic cardiomyopathy, and hypertension, now presenting to the emergency department for evaluation of chest pain.  Patient reports that he had been in his usual state of health until earlier today when he developed acute onset of left-sided chest pain with minimal exertion.  His pain was described as severe, localized to the left chest, achy in character, resolved with rest, and then recurred when he became active again.  He had never experienced similar symptoms previously and there has not been a recurrence.  But denies recent fevers or chills.  Reports mild cough and exertional dyspnea for the past couple days.  ED Course: Upon arrival to the ED, patient is found to be afebrile, saturating well on room air, slightly tachycardic, and mildly hypertensive.  EKG features a sinus rhythm with diffuse T wave abnormalities that are similar to prior.  Chest x-ray is notable for cardiomegaly, but no acute cardiopulmonary disease.  Chemistry panel is notable for a creatinine of 2.73, much improved from priors.  CBC is unremarkable, initial troponin is 0.02, and second troponin is also within the normal limits at 0.05.  Patient was treated with 324 mg of aspirin in the ED.  He has remained hemodynamically stable, has not had recurrence in his chest pain, and will be observed on the telemetry unit for ongoing evaluation and management of chest pain. 05/15/2017  patient admitted with exertional chest pain patient did not have any further episodes of chest pain during this hospital stay.  His EKG did not reveal any acute changes.  His troponin level was 0.04.  Discussed with cardiology they have already made follow-up appointment with him as an outpatient.  Patient has had multiple cancellations of appointments with cardiology and nephrology.  Discharge Diagnoses:  Principal Problem:   Chest pain Active Problems:   CKD (chronic kidney disease), stage IV (HCC)   NICM (nonischemic cardiomyopathy) (HCC)   Malignant hypertension 1] chest pain rule out for MI no acute changes in EKG or elevated levels of troponin.  Patient will follow up with cardiology as an outpatient.  Appointment has been already made. History of CKD stage IV stable 2] follow-up with nephrology Dr. Idolina Primer. 3] nonischemic cardiomyopathy ejection fraction 50-55% September 2018 echo.  Stable at this time continue Lasix Coreg and hydralazine.   Discharge Instructions  Discharge Instructions    Call MD for:  difficulty breathing, headache or visual disturbances   Complete by:  As directed    Call MD for:  extreme fatigue   Complete by:  As directed    Call MD for:  persistant dizziness or light-headedness   Complete by:  As directed    Call MD for:  persistant nausea and vomiting   Complete by:  As directed    Call MD for:  severe uncontrolled pain   Complete by:  As directed    Call MD for:  temperature >100.4   Complete by:  As directed    Diet - low sodium heart healthy   Complete by:  As directed  Increase activity slowly   Complete by:  As directed      Allergies as of 05/15/2017   No Known Allergies     Medication List    TAKE these medications   acetaminophen 325 MG tablet Commonly known as:  TYLENOL Take 2 tablets (650 mg total) by mouth every 4 (four) hours as needed for headache or mild pain.   amLODipine 10 MG tablet Commonly known as:  NORVASC Take 1  tablet (10 mg total) by mouth daily.   aspirin 81 MG EC tablet Take 1 tablet (81 mg total) by mouth daily.   calcitRIOL 0.25 MCG capsule Commonly known as:  ROCALTROL Take 1 capsule (0.25 mcg total) by mouth daily.   calcium elemental as carbonate 400 MG chewable tablet Commonly known as:  BARIATRIC TUMS ULTRA Chew 1,000 mg by mouth 3 (three) times daily.   carvedilol 25 MG tablet Commonly known as:  COREG Take 1.5 tablets (37.5 mg total) by mouth 2 (two) times daily with a meal.   furosemide 80 MG tablet Commonly known as:  LASIX Take 1 tablet (80 mg total) by mouth 2 (two) times daily.   hydrALAZINE 100 MG tablet Commonly known as:  APRESOLINE Take 1 tablet (100 mg total) by mouth every 8 (eight) hours.   isosorbide mononitrate 60 MG 24 hr tablet Commonly known as:  IMDUR Take 1 tablet (60 mg total) by mouth daily.      Follow-up Information    Lendon Colonel, NP Follow up on 05/25/2017.   Specialties:  Nurse Practitioner, Radiology, Cardiology Why:  Cardiology Hospital Follow-up on 05/25/2017 at [facility] information: [address] [place] [address] [phone]        Lucianne Lei, MD Follow up.   Specialty:  Family Medicine Contact information: [address]  [place] [address] [phone]          No Known Allergies  Consultations:  none   Procedures/Studies: Dg Chest 2 View  Result Date: 05/13/2017 CLINICAL DATA:  Short of breath and chest congestion EXAM: CHEST  2 VIEW COMPARISON:  06/14/2016 FINDINGS: Mild cardiomegaly. No focal infiltrate or effusion. No pneumothorax. IMPRESSION: No active cardiopulmonary disease.  Cardiomegaly. Electronically Signed   By: Donavan Foil M.D.   On: 05/13/2017 17:39    (Echo, Carotid, EGD, Colonoscopy, ERCP)    Subjective:   Discharge Exam: Vitals:   05/14/17 2011 05/15/17 0353  BP: (!) 155/80 (!) 147/79  Pulse: 90 84  Resp: 18 18  Temp: 99.6 F (37.6 C) 99.6 F  (37.6 C)  SpO2: 100% 95%   Vitals:   05/14/17 0255 05/14/17 1149 05/14/17 2011 05/15/17 0353  BP: (!) 149/78 (!) 149/73 (!) 155/80 (!) 147/79  Pulse: 78 82 90 84  Resp: 20 20 18 18   Temp: 98.8 F (37.1 C) 99 F (37.2 C) 99.6 F (37.6 C) 99.6 F (37.6 C)  TempSrc: Oral Oral Oral Oral  SpO2: 97% 99% 100% 95%  Weight: 131.2 kg (289 lb 4.8 oz)   130.8 kg (288 lb 4.8 oz)  Height: 6' (1.829 m)       General: Pt is alert, awake, not in acute distress Cardiovascular: RRR, S1/S2 +, no rubs, no gallops Respiratory: CTA bilaterally, no wheezing, no rhonchi Abdominal: Soft, NT, ND, bowel sounds + Extremities: no edema, no cyanosis    The results of significant diagnostics from this hospitalization (including imaging, microbiology, ancillary and laboratory) are listed below for reference.  Microbiology: No results found for this or any previous visit (from the past 240 hour(s)).   Labs: BNP (last 3 results) Recent Labs    06/10/16 1626  BNP 7,353.2*   Basic Metabolic Panel: Recent Labs  Lab 05/13/17 1653  NA 139  K 3.8  CL 105  CO2 23  GLUCOSE 106*  BUN 34*  CREATININE 2.73*  CALCIUM 9.0   Liver Function Tests: No results for input(s): AST, ALT, ALKPHOS, BILITOT, PROT, ALBUMIN in the last 168 hours. No results for input(s): LIPASE, AMYLASE in the last 168 hours. No results for input(s): AMMONIA in the last 168 hours. CBC: Recent Labs  Lab 05/13/17 1653  WBC 6.7  HGB 13.0  HCT 39.4  MCV 94.3  PLT 225   Cardiac Enzymes: Recent Labs  Lab 05/14/17 0209 05/14/17 0801 05/14/17 1339  TROPONINI 0.04* 0.04* 0.04*   BNP: Invalid input(s): POCBNP CBG: No results for input(s): GLUCAP in the last 168 hours. D-Dimer No results for input(s): DDIMER in the last 72 hours. Hgb A1c No results for input(s): HGBA1C in the last 72 hours. Lipid Profile No results for input(s): CHOL, HDL, LDLCALC, TRIG, CHOLHDL, LDLDIRECT in the last 72 hours. Thyroid function  studies No results for input(s): TSH, T4TOTAL, T3FREE, THYROIDAB in the last 72 hours.  Invalid input(s): FREET3 Anemia work up No results for input(s): VITAMINB12, FOLATE, FERRITIN, TIBC, IRON, RETICCTPCT in the last 72 hours. Urinalysis    Component Value Date/Time   COLORURINE YELLOW 06/14/2016 1939   APPEARANCEUR CLEAR 06/14/2016 1939   LABSPEC 1.011 06/14/2016 1939   PHURINE 6.0 06/14/2016 1939   GLUCOSEU NEGATIVE 06/14/2016 1939   HGBUR NEGATIVE 06/14/2016 [place] NEGATIVE 06/14/2016 [place] NEGATIVE 06/14/2016 1939   PROTEINUR 100 (A) 06/14/2016 1939   NITRITE NEGATIVE 06/14/2016 1939   LEUKOCYTESUR NEGATIVE 06/14/2016 1939   Sepsis Labs Invalid input(s): PROCALCITONIN,  WBC,  LACTICIDVEN Microbiology No results found for this or any previous visit (from the past 240 hour(s)).   Time coordinating discharge: Over 30 minutes  SIGNED:   Georgette Shell, MD  [facility] 05/15/2017, 10:26 AM  If 7PM-7AM, please contact night-coverage [email or web address] Password [email or web address]

## 2017-05-24 NOTE — Progress Notes (Deleted)
Cardiology Office Note   Date:  05/24/2017   ID:  Nathan Campbell, DOB 10/19/1975, MRN [id number]  PCP:  Lucianne Lei, MD  Cardiologist: Dr.Croitoru  No chief complaint on file.    History of Present Illness: Nathan Campbell is a 42 y.o. male who presents for ongoing assessment and management of hypertension, dyspnea on exertion, systolic dysfunction with nonischemic cardiomyopathy most recent EF 35% with global hypokinesis and grade 2 diastolic dysfunction. He also has chronic kidney disease with recent creatinine of 3.0. Just recently discharged from the hospital on 05/15/2017, after admission for chest pain. The patient's pain was exertional. The patient was ruled out for ACS. He was to follow-up with cardiology and nephrology known history of chronic kidney disease stage IV.  He was last seen in the office by Kerin Ransom, PA, on 11/11/2016, with lengthy discussion concerning health maintenance, need to take medication, and avoid dialysis. He was found to have difficulty understanding current health maintenance . Echocardiogram was reordered, with consideration for ICD implantation if EF less than 35%.  Echocardiogram 12/01/2016 Left ventricle: The cavity size was normal. Wall thickness was   increased in a pattern of severe LVH. Systolic function was   normal. The estimated ejection fraction was in the range of 55%   to 60%. Wall motion was normal; there were no regional wall   motion abnormalities. Left ventricular diastolic function   parameters were normal. - Aortic valve: There was trivial regurgitation. - Left atrium: The atrium was moderately dilated. - Atrial septum: No defect or patent foramen ovale was identified. - Impressions: Abnormal GLS -14.3. Past Medical History:  Diagnosis Date  . Acute combined systolic and diastolic CHF, NYHA class 4 ([place]) 06/10/2016  . Cardiomyopathy ([place]) 06/14/2016  . CHF (congestive heart failure) ([place])   . Dyspnea   . Hypertension   .  Hypertensive heart and kidney disease with heart failure ([place]) 06/14/2016  . Hypertensive heart disease with congestive heart failure [facility])     Past Surgical History:  Procedure Laterality Date  . NO PAST SURGERIES       Current Outpatient Medications  Medication Sig Dispense Refill  . acetaminophen (TYLENOL) 325 MG tablet Take 2 tablets (650 mg total) by mouth every 4 (four) hours as needed for headache or mild pain. 3 tablet   . amLODipine (NORVASC) 10 MG tablet Take 1 tablet (10 mg total) by mouth daily. 30 tablet 1  . aspirin EC 81 MG EC tablet Take 1 tablet (81 mg total) by mouth daily.    . calcitRIOL (ROCALTROL) 0.25 MCG capsule Take 1 capsule (0.25 mcg total) by mouth daily. 30 capsule 0  . calcium elemental as carbonate (BARIATRIC TUMS ULTRA) 400 MG chewable tablet Chew 1,000 mg by mouth 3 (three) times daily.    . carvedilol (COREG) 25 MG tablet Take 1.5 tablets (37.5 mg total) by mouth 2 (two) times daily with a meal. 90 tablet 11  . furosemide (LASIX) 80 MG tablet Take 1 tablet (80 mg total) by mouth 2 (two) times daily. 60 tablet 1  . hydrALAZINE (APRESOLINE) 100 MG tablet Take 1 tablet (100 mg total) by mouth every 8 (eight) hours. 90 tablet 1  . isosorbide mononitrate (IMDUR) 60 MG 24 hr tablet Take 1 tablet (60 mg total) by mouth daily. 30 tablet 1   No current facility-administered medications for this visit.     Allergies:   Patient has no known allergies.    Social History:  The patient  reports that  he has quit smoking. His smoking use included cigarettes. He has a 2.50 pack-year smoking history. he has never used smokeless tobacco. He reports that he drinks alcohol. He reports that he does not use drugs.   Family History:  The patient's family history includes Diabetes in his father; Diabetes Mellitus II in his paternal grandfather; Heart disease in his cousin and cousin; Hypertension in his father, mother, paternal grandmother, and sister.    ROS: All other  systems are reviewed and negative. Unless otherwise mentioned in H&P    PHYSICAL EXAM: VS:  There were no vitals taken for this visit. , BMI There is no height or weight on file to calculate BMI. GEN: Well nourished, well developed, in no acute distress  HEENT: normal  Neck: no JVD, carotid bruits, or masses Cardiac: ***RRR; no murmurs, rubs, or gallops,no edema  Respiratory:  clear to auscultation bilaterally, normal work of breathing GI: soft, nontender, nondistended, + BS MS: no deformity or atrophy  Skin: warm and dry, no rash Neuro:  Strength and sensation are intact Psych: euthymic mood, full affect   EKG:  EKG {ACTION; IS/IS [id number]} ordered today. The ekg ordered today demonstrates ***   Recent Labs: 06/10/2016: ALT 126; B Natriuretic Peptide 1,211.1 05/13/2017: BUN 34; Creatinine, Ser 2.73; Hemoglobin 13.0; Platelets 225; Potassium 3.8; Sodium 139    Lipid Panel No results found for: CHOL, TRIG, HDL, CHOLHDL, VLDL, LDLCALC, LDLDIRECT    Wt Readings from Last 3 Encounters:  05/15/17 288 lb 4.8 oz (130.8 kg)  11-25-2016 263 lb 6.4 oz (119.5 kg)  10/11/16 269 lb 3.2 oz (122.1 kg)      Other studies Reviewed: Echocardiogram 11/25/2016 Left ventricle: The cavity size was normal. Wall thickness was   increased in a pattern of severe LVH. Systolic function was   normal. The estimated ejection fraction was in the range of 55%   to 60%. Wall motion was normal; there were no regional wall   motion abnormalities. Left ventricular diastolic function   parameters were normal. - Aortic valve: There was trivial regurgitation. - Left atrium: The atrium was moderately dilated. - Atrial septum: No defect or patent foramen ovale was identified. - Impressions: Abnormal GLS -14.3.  Impressions:  - Abnormal GLS -14.3.  ASSESSMENT AND PLAN:  1.  ***   Current medicines are reviewed at length with the patient today.    Labs/ tests ordered today include: *** Phill Myron.  West Pugh, ANP, AACC   05/24/2017 7:27 AM    [facility] [address] [place], [place] [address] Phone: [phone]; Fax: [phone]

## 2017-05-25 ENCOUNTER — Ambulatory Visit: Payer: BLUE CROSS/BLUE SHIELD | Admitting: Adult Health

## 2017-06-09 NOTE — Progress Notes (Deleted)
Cardiology Office Note   Date:  06/09/2017   ID:  Nathan Campbell, DOB [birth year], MRN [id number]  PCP:  Lucianne Lei, MD  Cardiologist:   No chief complaint on file.    History of Present Illness: Nathan Campbell is a 42 y.o. male who presents for ongoing assessment and management of nonischemic cardiomyopathy, hypertension, had recent visit to the emergency room on 05/15/2017 for recurrent chest pain.  The patient ultimately was admitted for 24 hours observation, did have elevated troponin which peaked at 0.05.  The patient was ruled out for ACS.  According to the notes, she has had multiple cancellations of appointments with cardiology and nephrology.  Other history includes chronic kidney disease stage IV    Past Medical History:  Diagnosis Date  . Acute combined systolic and diastolic CHF, NYHA class 4 ([place]) 06/10/2016  . Cardiomyopathy ([place]) 06/14/2016  . CHF (congestive heart failure) ([place])   . Dyspnea   . Hypertension   . Hypertensive heart and kidney disease with heart failure ([place]) 06/14/2016  . Hypertensive heart disease with congestive heart failure [facility])     Past Surgical History:  Procedure Laterality Date  . NO PAST SURGERIES       Current Outpatient Medications  Medication Sig Dispense Refill  . acetaminophen (TYLENOL) 325 MG tablet Take 2 tablets (650 mg total) by mouth every 4 (four) hours as needed for headache or mild pain. 3 tablet   . amLODipine (NORVASC) 10 MG tablet Take 1 tablet (10 mg total) by mouth daily. 30 tablet 1  . aspirin EC 81 MG EC tablet Take 1 tablet (81 mg total) by mouth daily.    . calcitRIOL (ROCALTROL) 0.25 MCG capsule Take 1 capsule (0.25 mcg total) by mouth daily. 30 capsule 0  . calcium elemental as carbonate (BARIATRIC TUMS ULTRA) 400 MG chewable tablet Chew 1,000 mg by mouth 3 (three) times daily.    . carvedilol (COREG) 25 MG tablet Take 1.5 tablets (37.5 mg total) by mouth 2 (two) times daily with a meal. 90 tablet 11  . furosemide  (LASIX) 80 MG tablet Take 1 tablet (80 mg total) by mouth 2 (two) times daily. 60 tablet 1  . hydrALAZINE (APRESOLINE) 100 MG tablet Take 1 tablet (100 mg total) by mouth every 8 (eight) hours. 90 tablet 1  . isosorbide mononitrate (IMDUR) 60 MG 24 hr tablet Take 1 tablet (60 mg total) by mouth daily. 30 tablet 1   No current facility-administered medications for this visit.     Allergies:   Patient has no known allergies.    Social History:  The patient  reports that he has quit smoking. His smoking use included cigarettes. He has a 2.50 pack-year smoking history. He has never used smokeless tobacco. He reports that he drinks alcohol. He reports that he does not use drugs.   Family History:  The patient's family history includes Diabetes in his father; Diabetes Mellitus II in his paternal grandfather; Heart disease in his cousin and cousin; Hypertension in his father, mother, paternal grandmother, and sister.    ROS: All other systems are reviewed and negative. Unless otherwise mentioned in H&P    PHYSICAL EXAM: VS:  There were no vitals taken for this visit. , BMI There is no height or weight on file to calculate BMI. GEN: Well nourished, well developed, in no acute distress  HEENT: normal  Neck: no JVD, carotid bruits, or masses Cardiac: ***RRR; no murmurs, rubs, or gallops,no edema  Respiratory:  clear  to auscultation bilaterally, normal work of breathing GI: soft, nontender, nondistended, + BS MS: no deformity or atrophy  Skin: warm and dry, no rash Neuro:  Strength and sensation are intact Psych: euthymic mood, full affect   EKG:  EKG {ACTION; IS/IS [id number]} ordered today. The ekg ordered today demonstrates ***   Recent Labs: 06/10/2016: ALT 126; B Natriuretic Peptide 1,211.1 05/13/2017: BUN 34; Creatinine, Ser 2.73; Hemoglobin 13.0; Platelets 225; Potassium 3.8; Sodium 139    Lipid Panel No results found for: CHOL, TRIG, HDL, CHOLHDL, VLDL, LDLCALC, LDLDIRECT     Wt Readings from Last 3 Encounters:  05/15/17 288 lb 4.8 oz (130.8 kg)  11/11/16 263 lb 6.4 oz (119.5 kg)  10/11/16 269 lb 3.2 oz (122.1 kg)      Other studies Reviewed: Echocardiogram Dec 20, 2016 Left ventricle: The cavity size was normal. Wall thickness was   increased in a pattern of severe LVH. Systolic function was   normal. The estimated ejection fraction was in the range of 55%   to 60%. Wall motion was normal; there were no regional wall   motion abnormalities. Left ventricular diastolic function   parameters were normal. - Aortic valve: There was trivial regurgitation. - Left atrium: The atrium was moderately dilated. - Atrial septum: No defect or patent foramen ovale was identified. - Impressions: Abnormal GLS -14.3.  ASSESSMENT AND PLAN:  1.  ***   Current medicines are reviewed at length with the patient today.    Labs/ tests ordered today include: *** Phill Myron. West Pugh, ANP, AACC   06/09/2017 7:36 AM    [facility] [address], [place],  [address] Phone: [phone]; Fax: [phone]

## 2017-06-13 ENCOUNTER — Ambulatory Visit: Payer: BLUE CROSS/BLUE SHIELD | Admitting: Adult Health

## 2017-06-13 DIAGNOSIS — R0989 Other specified symptoms and signs involving the circulatory and respiratory systems: Secondary | ICD-10-CM

## 2017-06-14 ENCOUNTER — Encounter: Payer: Self-pay | Admitting: *Deleted

## 2017-08-03 DIAGNOSIS — R05 Cough: Secondary | ICD-10-CM | POA: Diagnosis not present

## 2017-08-03 DIAGNOSIS — I10 Essential (primary) hypertension: Secondary | ICD-10-CM | POA: Diagnosis not present

## 2017-08-03 DIAGNOSIS — J069 Acute upper respiratory infection, unspecified: Secondary | ICD-10-CM | POA: Diagnosis not present

## 2017-11-08 ENCOUNTER — Emergency Department (HOSPITAL_COMMUNITY): Payer: BLUE CROSS/BLUE SHIELD

## 2017-11-08 ENCOUNTER — Emergency Department (HOSPITAL_COMMUNITY)
Admission: EM | Admit: 2017-11-08 | Discharge: 2017-11-08 | Disposition: A | Discharge status: Home / Self Care | Payer: BLUE CROSS/BLUE SHIELD | Attending: Emergency Medicine | Admitting: Emergency Medicine

## 2017-11-08 DIAGNOSIS — I5040 Unspecified combined systolic (congestive) and diastolic (congestive) heart failure: Secondary | ICD-10-CM | POA: Diagnosis not present

## 2017-11-08 DIAGNOSIS — Z79899 Other long term (current) drug therapy: Secondary | ICD-10-CM | POA: Diagnosis not present

## 2017-11-08 DIAGNOSIS — M25571 Pain in right ankle and joints of right foot: Secondary | ICD-10-CM | POA: Insufficient documentation

## 2017-11-08 DIAGNOSIS — N184 Chronic kidney disease, stage 4 (severe): Secondary | ICD-10-CM | POA: Diagnosis not present

## 2017-11-08 DIAGNOSIS — Z87891 Personal history of nicotine dependence: Secondary | ICD-10-CM | POA: Insufficient documentation

## 2017-11-08 DIAGNOSIS — I10 Essential (primary) hypertension: Secondary | ICD-10-CM

## 2017-11-08 DIAGNOSIS — I130 Hypertensive heart and chronic kidney disease with heart failure and stage 1 through stage 4 chronic kidney disease, or unspecified chronic kidney disease: Secondary | ICD-10-CM | POA: Diagnosis not present

## 2017-11-08 NOTE — ED Triage Notes (Signed)
Per pt, states he thinks he might have twisted right ankle when he got out of bed early this am-no trauma, pain when bearing weight

## 2017-11-08 NOTE — Discharge Instructions (Signed)
Please see the information and instructions below regarding your visit.  Your diagnoses today include:  1. Acute right ankle pain   2. Elevated blood pressure reading in office with diagnosis of hypertension    Your provider has diagnosed you as suffering from an ankle sprain. Ankle sprain occurs when the ligaments that hold the ankle joint together are stretched or torn. It may take 4 to 6 weeks to heal.  Tests performed today include: An x-ray of your ankle - does NOT show any broken bones  See side panel of your discharge paperwork for testing performed today. Vital signs are listed at the bottom of these instructions.   Medications prescribed:  Take any prescribed medications only as prescribed, and any over the counter medications only as directed on the packaging.  Please do not take nonsteroidal medications due to your kidney disease.  This includes ibuprofen/motrin/advil and naproxen/Aleve.  Please take Tylenol, 650 mg every 6 hours as needed for pain.  Do not exceed 4 g in 1 day.  Please also use Arnicare gel over the ankle (found in pain aisle of pharmacy).   Home care instructions:  Follow R.I.C.E. Protocol: R - rest your injury  I  - use ice on injury without applying directly to skin C - compress injury with bandage or splint E - elevate the injury as much as possible  For Activity: Wear ankle brace for at least 2 weeks for stabilization of ankle. If prescribed crutches, use crutches with non-weight bearing for the first few days. Then, you may walk on your ankle as the pain allows, or as instructed. Start gradually with weight bearing on the affected ankle. Once you can walk pain free, then try jogging. When you can run forwards, then you can try moving side-to-side. If you cannot walk without crutches in one week, you need a re-check.  Please follow any educational materials contained in this packet.   Follow-up instructions: Please follow-up with your primary care  provider or the provided orthopedic (bone specialist) listed in this packet if you continue to have significant pain or trouble walking in 1 week. In this case you may have a severe sprain that requires further care.   Return instructions:  Please return if your toes are numb or tingling, appear gray or blue, are much colder than your other foot, or you have severe pain (also elevate leg and loosen splint or wrap). Return to the emergency department for any fevers, chills, redness or swelling of the ankle. Please return to the Emergency Department if you experience worsening symptoms.  Please return if you have any other emergent concerns.  Additional Information:   Your vital signs today were: BP (!) 156/93    Pulse 82    Temp 98.2 F (36.8 C)    Resp 16    SpO2 97%  If your blood pressure (BP) was elevated on multiple readings during this visit above 130 for the top number or above 80 for the bottom number, please have this repeated by your primary care provider within one month. --------------  Thank you for allowing [place] to participate in your care today.

## 2017-11-08 NOTE — ED Provider Notes (Signed)
[facility] DEPT Provider Note   CSN: [id number] Arrival date & time: 11/08/17  0911     History   Chief Complaint Chief Complaint  Patient presents with  . Ankle Pain    HPI Nathan Campbell is a 42 y.o. male.  HPI  Patient is a 42 year old male with a history of cardiomyopathy, hypertension, and CHF, combined systolic and diastolic presenting for right ankle pain.  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  Patient denies any swelling, erythema, or inability to perform range of motion of his ankle.  Patient denies any fevers or chills.  No history of immunocompromise status.  Patient denies any loss of sensation or weakness of the right lower extremity.  Patient denies any calf tenderness or swelling.  No remedies tried prior to arrival for symptoms.  Past Medical History:  Diagnosis Date  . Acute combined systolic and diastolic CHF, NYHA class 4 ([place]) 06/10/2016  . Cardiomyopathy ([place]) 06/14/2016  . CHF (congestive heart failure) ([place])   . Dyspnea   . Hypertension   . Hypertensive heart and kidney disease with heart failure ([place]) 06/14/2016  . Hypertensive heart disease with congestive heart failure [facility])     Patient Active Problem List   Diagnosis Date Noted  . Chest pain 05/14/2017  . EKG abnormalities   . Malignant hypertension 10/14/2016  . Hypertensive heart disease with CHF (congestive heart failure) ([place]) 06/14/2016  . NICM (nonischemic cardiomyopathy) ([place]) 06/14/2016  . Renal failure   . Hypertensive urgency 06/10/2016  . CKD (chronic kidney disease), stage IV ([place]) 06/10/2016  . Elevated troponin 06/10/2016  . Elevated transaminase level 06/10/2016  . Normocytic anemia 06/10/2016    Past Surgical History:  Procedure Laterality Date  . NO PAST SURGERIES          Home Medications    Prior to Admission medications     Medication Sig Start Date End Date Taking? Authorizing Provider  amLODipine (NORVASC) 10 MG tablet Take 1 tablet (10 mg total) by mouth daily. 06/16/16  Yes Verlee Monte, MD  calcitRIOL (ROCALTROL) 0.25 MCG capsule Take 1 capsule (0.25 mcg total) by mouth daily. 06/16/16  Yes Verlee Monte, MD  calcium carbonate (TUMS - DOSED IN MG ELEMENTAL CALCIUM) 500 MG chewable tablet Chew 1 tablet by mouth 3 (three) times daily with meals.   Yes [provider]  carvedilol (COREG) 25 MG tablet Take 1.5 tablets (37.5 mg total) by mouth 2 (two) times daily with a meal. 10/11/16  Yes Croitoru, Mihai, MD  doxazosin (CARDURA) 8 MG tablet Take 8 mg by mouth at bedtime. 11/03/17  Yes [provider]  furosemide (LASIX) 80 MG tablet Take 1 tablet (80 mg total) by mouth 2 (two) times daily. 06/15/16  Yes Verlee Monte, MD  hydrALAZINE (APRESOLINE) 100 MG tablet Take 1 tablet (100 mg total) by mouth every 8 (eight) hours. 06/15/16  Yes Verlee Monte, MD  isosorbide mononitrate (IMDUR) 60 MG 24 hr tablet Take 1 tablet (60 mg total) by mouth daily. 06/16/16  Yes Verlee Monte, MD  acetaminophen (TYLENOL) 325 MG tablet Take 2 tablets (650 mg total) by mouth every 4 (four) hours as needed for headache or mild pain. Patient not taking: Reported on 11/08/2017 05/15/17   Georgette Shell, MD  aspirin EC 81 MG EC tablet Take 1 tablet (81 mg total) by mouth daily. Patient not  taking: Reported on 11/08/2017 05/15/17   Georgette Shell, MD    Family History Family History  Problem Relation Age of Onset  . Diabetes Father   . Hypertension Father   . Heart disease Cousin   . Heart disease Cousin   . Hypertension Mother   . Hypertension Sister   . Hypertension Paternal Grandmother   . Diabetes Mellitus II Paternal Grandfather     Social History Social History   Tobacco Use  . Smoking status: Former Smoker    Packs/day: 0.50    Years: 5.00    Pack years: 2.50    Types: Cigarettes  . Smokeless tobacco:  Never Used  Substance Use Topics  . Alcohol use: Yes    Comment: socially  . Drug use: No     Allergies   Patient has no known allergies.   Review of Systems Review of Systems  Constitutional: Negative for chills and fever.  Musculoskeletal: Positive for arthralgias. Negative for joint swelling.  Skin: Negative for color change and wound.  Neurological: Negative for weakness and numbness.     Physical Exam Updated Vital Signs BP (!) 156/93   Pulse 82   Temp 98.2 F (36.8 C)   Resp 16   SpO2 97%   Physical Exam  Constitutional: He appears well-developed and well-nourished. No distress.  Sitting comfortably in bed.  HENT:  Head: Normocephalic and atraumatic.  Eyes: Conjunctivae are normal. Right eye exhibits no discharge. Left eye exhibits no discharge.  EOMs normal to gross examination.  Neck: Normal range of motion.  Cardiovascular: Normal rate and regular rhythm.  Intact, 2+ DP pulses.   Pulmonary/Chest:  Normal respiratory effort. Patient converses comfortably. No audible wheeze or stridor.  Abdominal: He exhibits no distension.  Musculoskeletal:  Right ankle with tenderness to palpation of anterior ankle. No swelling.  Full ROM, both passive and active. No erythema, ecchymosis, or deformity appreciated. No break in skin. No pain to fifth metatarsal area or navicular region. No calf TTP. Achilles intact per Thompson's test. Good pedal pulse and cap refill of toes. Sensation intact to light touch distally.   Neurological: He is alert.  Cranial nerves intact to gross observation. Patient moves extremities without difficulty.  Skin: Skin is warm and dry. He is not diaphoretic.  Psychiatric: He has a normal mood and affect. His behavior is normal. Judgment and thought content normal.  Nursing note and vitals reviewed.    ED Treatments / Results  Labs (all labs ordered are listed, but only abnormal results are displayed) Labs Reviewed - No data to  display  EKG None  Radiology Dg Ankle Complete Right  Result Date: 11/08/2017 CLINICAL DATA:  Medial ankle pain.  No known injury. EXAM: RIGHT ANKLE - COMPLETE 3+ VIEW COMPARISON:  None. FINDINGS: The ankle mortise is maintained. No acute ankle fracture. The subtalar and midfoot the joint spaces are also maintained. Calcaneal spurring changes. IMPRESSION: No acute fracture. Electronically Signed   By: Marijo Sanes M.D.   On: 11/08/2017 10:05    Procedures Procedures (including critical care time)  Medications Ordered in ED Medications - No data to display   Initial Impression / Assessment and Plan / ED Course  I have reviewed the triage vital signs and the nursing notes.  Pertinent labs & imaging results that were available during my care of the patient were reviewed by me and considered in my medical decision making (see chart for details).     Patient is nontoxic-appearing, afebrile, and  in no acute distress.  Patient neurovascularly intact in the right lower extremity.  Differential diagnosis includes tendinitis versus plantar fasciitis versus ankle sprain.  Doubt septic arthritis or osteomyelitis, as patient has had no fevers, infectious symptoms, increasing swelling, or erythema.  There is no soft tissue gas suggestive of such etiologies on x-ray of the right ankle.  No fracture.  ASO splint applied, crutches dispensed. RICE therapy discussed.  Discussed with patient not taking NSAIDs, as he has CKD.  Orthopedic follow-up given, and discussed with patient to return for any increasing pain, erythema or swelling.  Patient is in understanding and agrees with the plan of care.  Final Clinical Impressions(s) / ED Diagnoses   Final diagnoses:  Acute right ankle pain  Elevated blood pressure reading in office with diagnosis of hypertension    ED Discharge Orders    None       Tamala Julian 11/08/17 1224    Julianne Rice, MD 11/08/17 1459

## 2018-01-05 DIAGNOSIS — N184 Chronic kidney disease, stage 4 (severe): Secondary | ICD-10-CM | POA: Diagnosis not present

## 2018-01-05 DIAGNOSIS — I129 Hypertensive chronic kidney disease with stage 1 through stage 4 chronic kidney disease, or unspecified chronic kidney disease: Secondary | ICD-10-CM | POA: Diagnosis not present

## 2018-01-05 DIAGNOSIS — R809 Proteinuria, unspecified: Secondary | ICD-10-CM | POA: Diagnosis not present

## 2018-01-05 DIAGNOSIS — N2581 Secondary hyperparathyroidism of renal origin: Secondary | ICD-10-CM | POA: Diagnosis not present

## 2018-03-18 ENCOUNTER — Other Ambulatory Visit: Payer: Self-pay

## 2018-03-18 ENCOUNTER — Emergency Department (HOSPITAL_BASED_OUTPATIENT_CLINIC_OR_DEPARTMENT_OTHER)
Admission: EM | Admit: 2018-03-18 | Discharge: 2018-03-18 | Disposition: A | Discharge status: Home / Self Care | Payer: BLUE CROSS/BLUE SHIELD | Attending: Emergency Medicine | Admitting: Emergency Medicine

## 2018-03-18 ENCOUNTER — Emergency Department (HOSPITAL_BASED_OUTPATIENT_CLINIC_OR_DEPARTMENT_OTHER): Payer: BLUE CROSS/BLUE SHIELD

## 2018-03-18 ENCOUNTER — Encounter (HOSPITAL_BASED_OUTPATIENT_CLINIC_OR_DEPARTMENT_OTHER): Payer: Self-pay | Admitting: *Deleted

## 2018-03-18 DIAGNOSIS — N184 Chronic kidney disease, stage 4 (severe): Secondary | ICD-10-CM | POA: Insufficient documentation

## 2018-03-18 DIAGNOSIS — B9789 Other viral agents as the cause of diseases classified elsewhere: Secondary | ICD-10-CM | POA: Insufficient documentation

## 2018-03-18 DIAGNOSIS — J069 Acute upper respiratory infection, unspecified: Secondary | ICD-10-CM | POA: Insufficient documentation

## 2018-03-18 DIAGNOSIS — I5041 Acute combined systolic (congestive) and diastolic (congestive) heart failure: Secondary | ICD-10-CM | POA: Diagnosis not present

## 2018-03-18 DIAGNOSIS — I130 Hypertensive heart and chronic kidney disease with heart failure and stage 1 through stage 4 chronic kidney disease, or unspecified chronic kidney disease: Secondary | ICD-10-CM | POA: Diagnosis not present

## 2018-03-18 DIAGNOSIS — R05 Cough: Secondary | ICD-10-CM | POA: Diagnosis not present

## 2018-03-18 DIAGNOSIS — Z7982 Long term (current) use of aspirin: Secondary | ICD-10-CM | POA: Insufficient documentation

## 2018-03-18 DIAGNOSIS — F17210 Nicotine dependence, cigarettes, uncomplicated: Secondary | ICD-10-CM | POA: Diagnosis not present

## 2018-03-18 DIAGNOSIS — Z79899 Other long term (current) drug therapy: Secondary | ICD-10-CM | POA: Insufficient documentation

## 2018-03-18 MED ORDER — BENZONATATE 100 MG PO CAPS: 100.0000 mg | ORAL_CAPSULE | Freq: Three times a day (TID) | ORAL | 0 refills | Status: DC

## 2018-03-18 MED ORDER — SALINE SPRAY 0.65 % NA SOLN: 1.0000 | NASAL | 0 refills | Status: DC | PRN

## 2018-03-18 NOTE — Discharge Instructions (Addendum)
Take Tessalon every 8 hours as needed for cough.  Take nasal saline as needed for nasal congestion.  You can take Tylenol as prescribed over-the-counter, as needed for sore throat, body aches, fever.  Make sure to stay well-hydrated and get plenty of rest.  Please see your doctor in 3 to 4 days if your symptoms are not improving.  Please return the emergency department if you develop any new or worsening symptoms including chest pain, worsening shortness of breath, persistent fever 100.4, or any other new or concerning symptoms.

## 2018-03-18 NOTE — ED Provider Notes (Signed)
[facility] EMERGENCY DEPARTMENT Provider Note   CSN: [id number] Arrival date & time: 03/18/18  1602     History   Chief Complaint Chief Complaint  Patient presents with  . Cough    HPI Nathan Campbell is a 43 y.o. male with history of CHF, nonischemic cardiomyopathy, hypertension who presents with a 3-day history of nasal congestion, intermittent sore throat, cough.  Patient denies any fever.  He reports some mild, intermittent shortness of breath most related to his congestion.  He has not been taking any medications at home for symptoms.  He he has had some episodes of posttussive emesis, but no other freestanding emesis.  He denies any abdominal pain.  He has had some intermittent episodes of nonbloody diarrhea.  He is tolerating oral fluids well.  HPI  Past Medical History:  Diagnosis Date  . Acute combined systolic and diastolic CHF, NYHA class 4 ([place]) 06/10/2016  . Cardiomyopathy ([place]) 06/14/2016  . CHF (congestive heart failure) ([place])   . Dyspnea   . Hypertension   . Hypertensive heart and kidney disease with heart failure ([place]) 06/14/2016  . Hypertensive heart disease with congestive heart failure [facility])     Patient Active Problem List   Diagnosis Date Noted  . Chest pain 05/14/2017  . EKG abnormalities   . Malignant hypertension 10/14/2016  . Hypertensive heart disease with CHF (congestive heart failure) ([place]) 06/14/2016  . NICM (nonischemic cardiomyopathy) ([place]) 06/14/2016  . Renal failure   . Hypertensive urgency 06/10/2016  . CKD (chronic kidney disease), stage IV ([place]) 06/10/2016  . Elevated troponin 06/10/2016  . Elevated transaminase level 06/10/2016  . Normocytic anemia 06/10/2016    Past Surgical History:  Procedure Laterality Date  . NO PAST SURGERIES          Home Medications    Prior to Admission medications   Medication Sig Start Date End Date Taking? Authorizing Provider  acetaminophen (TYLENOL) 325 MG tablet Take 2 tablets (650 mg  total) by mouth every 4 (four) hours as needed for headache or mild pain. Patient not taking: Reported on 11/08/2017 05/15/17   Georgette Shell, MD  amLODipine (NORVASC) 10 MG tablet Take 1 tablet (10 mg total) by mouth daily. 06/16/16   Verlee Monte, MD  aspirin EC 81 MG EC tablet Take 1 tablet (81 mg total) by mouth daily. Patient not taking: Reported on 11/08/2017 05/15/17   Georgette Shell, MD  benzonatate (TESSALON) 100 MG capsule Take 1 capsule (100 mg total) by mouth every 8 (eight) hours. 03/18/18   Dalene Robards, Bea Graff, PA-C  calcitRIOL (ROCALTROL) 0.25 MCG capsule Take 1 capsule (0.25 mcg total) by mouth daily. 06/16/16   Verlee Monte, MD  calcium carbonate (TUMS - DOSED IN MG ELEMENTAL CALCIUM) 500 MG chewable tablet Chew 1 tablet by mouth 3 (three) times daily with meals.    [provider]  carvedilol (COREG) 25 MG tablet Take 1.5 tablets (37.5 mg total) by mouth 2 (two) times daily with a meal. 10/11/16   Croitoru, Mihai, MD  doxazosin (CARDURA) 8 MG tablet Take 8 mg by mouth at bedtime. 11/03/17   [provider]  furosemide (LASIX) 80 MG tablet Take 1 tablet (80 mg total) by mouth 2 (two) times daily. 06/15/16   Verlee Monte, MD  hydrALAZINE (APRESOLINE) 100 MG tablet Take 1 tablet (100 mg total) by mouth every 8 (eight) hours. 06/15/16   Verlee Monte, MD  isosorbide mononitrate (IMDUR) 60 MG 24 hr tablet Take 1 tablet (  60 mg total) by mouth daily. 06/16/16   Verlee Monte, MD  sodium chloride (OCEAN) 0.65 % SOLN nasal spray Place 1 spray into both nostrils as needed for congestion. 03/18/18   Frederica Kuster, PA-C    Family History Family History  Problem Relation Age of Onset  . Diabetes Father   . Hypertension Father   . Heart disease Cousin   . Heart disease Cousin   . Hypertension Mother   . Hypertension Sister   . Hypertension Paternal Grandmother   . Diabetes Mellitus II Paternal Grandfather     Social History Social History   Tobacco Use  . Smoking  status: Current Every Day Smoker    Packs/day: 0.50    Years: 5.00    Pack years: 2.50    Types: Cigarettes  . Smokeless tobacco: Never Used  Substance Use Topics  . Alcohol use: Yes    Comment: socially  . Drug use: Not Currently    Types: Marijuana     Allergies   Patient has no known allergies.   Review of Systems Review of Systems  Constitutional: Negative for chills and fever.  HENT: Positive for congestion, ear pain and sore throat. Negative for facial swelling.   Respiratory: Positive for cough and shortness of breath (mild, intermittent).   Cardiovascular: Negative for chest pain.  Gastrointestinal: Positive for diarrhea and vomiting (post tussive only). Negative for abdominal pain and nausea.  Genitourinary: Negative for dysuria.  Musculoskeletal: Negative for back pain.  Skin: Negative for rash and wound.  Neurological: Negative for headaches.  Psychiatric/Behavioral: The patient is not nervous/anxious.      Physical Exam Updated Vital Signs BP (!) 158/104 (BP Location: Right Arm)   Pulse 81   Temp 99.3 F (37.4 C) (Oral)   Resp 20   Ht 6\' 1"  (1.854 m)   Wt 131.5 kg   SpO2 95%   BMI 38.26 kg/m   Physical Exam Vitals signs and nursing note reviewed.  Constitutional:      General: He is not in acute distress.    Appearance: He is well-developed. He is not diaphoretic.  HENT:     Head: Normocephalic and atraumatic.     Right Ear: Tympanic membrane normal.     Left Ear: Tympanic membrane normal.     Mouth/Throat:     Pharynx: No oropharyngeal exudate.     Tonsils: No tonsillar exudate or tonsillar abscesses. Swelling: 1+ on the right. 1+ on the left.  Eyes:     General: No scleral icterus.       Right eye: No discharge.        Left eye: No discharge.     Conjunctiva/sclera: Conjunctivae normal.     Pupils: Pupils are equal, round, and reactive to light.  Neck:     Musculoskeletal: Normal range of motion and neck supple.     Thyroid: No  thyromegaly.  Cardiovascular:     Rate and Rhythm: Normal rate and regular rhythm.     Heart sounds: Normal heart sounds. No murmur. No friction rub. No gallop.   Pulmonary:     Effort: Pulmonary effort is normal. No respiratory distress.     Breath sounds: Normal breath sounds. No stridor. No wheezing or rales.  Abdominal:     General: Bowel sounds are normal. There is no distension.     Palpations: Abdomen is soft.     Tenderness: There is no abdominal tenderness. There is no guarding or rebound.  Lymphadenopathy:  Cervical: No cervical adenopathy.  Skin:    General: Skin is warm and dry.     Coloration: Skin is not pale.     Findings: No rash.  Neurological:     Mental Status: He is alert.     Coordination: Coordination normal.      ED Treatments / Results  Labs (all labs ordered are listed, but only abnormal results are displayed) Labs Reviewed - No data to display  EKG None  Radiology Dg Chest 2 View  Result Date: 03/18/2018 CLINICAL DATA:  Cough and congestion 4 days. EXAM: CHEST - 2 VIEW COMPARISON:  05/13/2017 FINDINGS: Lungs are adequately inflated without consolidation or effusion. Cardiomediastinal silhouette, bones and soft tissues are normal. IMPRESSION: No active cardiopulmonary disease. Electronically Signed   By: Marin Olp M.D.   On: 03/18/2018 16:23    Procedures Procedures (including critical care time)  Medications Ordered in ED Medications - No data to display   Initial Impression / Assessment and Plan / ED Course  I have reviewed the triage vital signs and the nursing notes.  Pertinent labs & imaging results that were available during my care of the patient were reviewed by me and considered in my medical decision making (see chart for details).     Patient presenting with URI symptoms.  He has had some episodes of posttussive emesis, but no other vomiting.  He denies some intermittent diarrhea.  He denies abdominal pain.  He is very  well-appearing with stable vitals.  His chest x-ray is clear and shows no signs of pneumonia or fluid overload.  Patient will be treated supportively with Tessalon, Tylenol, nasal saline.  Patient advised to stay well-hydrated and get plenty of rest.  Patient advised to follow-up with his PCP for recheck in a few days.  Strict return precautions discussed.  Patient understands and agrees with plan.  Patient vitals stable throughout ED course and discharged in satisfactory condition.  Final Clinical Impressions(s) / ED Diagnoses   Final diagnoses:  Viral URI with cough    ED Discharge Orders         Ordered    benzonatate (TESSALON) 100 MG capsule  Every 8 hours     03/18/18 1907    sodium chloride (OCEAN) 0.65 % SOLN nasal spray  As needed     03/18/18 1907           Frederica Kuster, PA-C 03/18/18 1951    Charlesetta Shanks, MD 03/19/18 0104

## 2018-03-18 NOTE — ED Triage Notes (Signed)
Pt c/o productive cough and  "cold in my chest" x 3-4 days

## 2018-05-03 DIAGNOSIS — I129 Hypertensive chronic kidney disease with stage 1 through stage 4 chronic kidney disease, or unspecified chronic kidney disease: Secondary | ICD-10-CM | POA: Diagnosis not present

## 2018-05-03 DIAGNOSIS — N184 Chronic kidney disease, stage 4 (severe): Secondary | ICD-10-CM | POA: Diagnosis not present

## 2018-05-03 DIAGNOSIS — R809 Proteinuria, unspecified: Secondary | ICD-10-CM | POA: Diagnosis not present

## 2018-05-03 DIAGNOSIS — N2581 Secondary hyperparathyroidism of renal origin: Secondary | ICD-10-CM | POA: Diagnosis not present

## 2018-06-22 DIAGNOSIS — I129 Hypertensive chronic kidney disease with stage 1 through stage 4 chronic kidney disease, or unspecified chronic kidney disease: Secondary | ICD-10-CM | POA: Diagnosis not present

## 2018-06-22 DIAGNOSIS — R809 Proteinuria, unspecified: Secondary | ICD-10-CM | POA: Diagnosis not present

## 2018-06-22 DIAGNOSIS — N2581 Secondary hyperparathyroidism of renal origin: Secondary | ICD-10-CM | POA: Diagnosis not present

## 2018-06-22 DIAGNOSIS — N184 Chronic kidney disease, stage 4 (severe): Secondary | ICD-10-CM | POA: Diagnosis not present

## 2018-12-19 DIAGNOSIS — N189 Chronic kidney disease, unspecified: Secondary | ICD-10-CM | POA: Diagnosis not present

## 2018-12-19 DIAGNOSIS — N184 Chronic kidney disease, stage 4 (severe): Secondary | ICD-10-CM | POA: Diagnosis not present

## 2018-12-19 DIAGNOSIS — R809 Proteinuria, unspecified: Secondary | ICD-10-CM | POA: Diagnosis not present

## 2018-12-19 DIAGNOSIS — D631 Anemia in chronic kidney disease: Secondary | ICD-10-CM | POA: Diagnosis not present

## 2018-12-19 DIAGNOSIS — R7309 Other abnormal glucose: Secondary | ICD-10-CM | POA: Diagnosis not present

## 2018-12-19 DIAGNOSIS — I129 Hypertensive chronic kidney disease with stage 1 through stage 4 chronic kidney disease, or unspecified chronic kidney disease: Secondary | ICD-10-CM | POA: Diagnosis not present

## 2018-12-19 DIAGNOSIS — N2581 Secondary hyperparathyroidism of renal origin: Secondary | ICD-10-CM | POA: Diagnosis not present

## 2019-03-08 IMAGING — CR DG ANKLE COMPLETE 3+V*R*
3 series · 3 of 3 positions shown · non-contrast
Comparison: None.

CLINICAL DATA: Medial ankle pain.  No known injury.

EXAM:
RIGHT ANKLE - COMPLETE 3+ VIEW

[x ankle ap right]
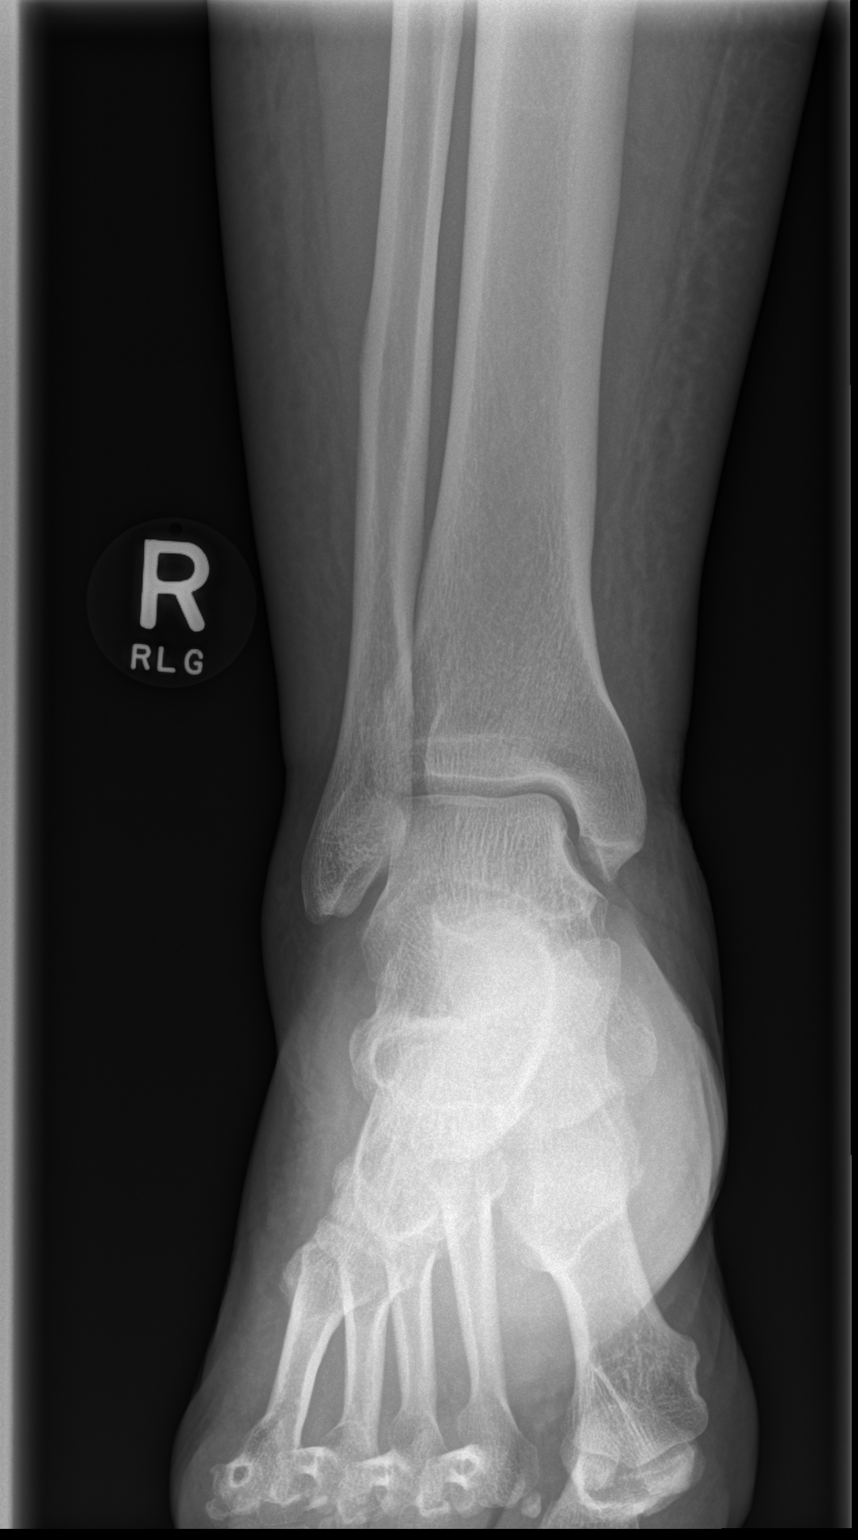

[x ankle obl right]
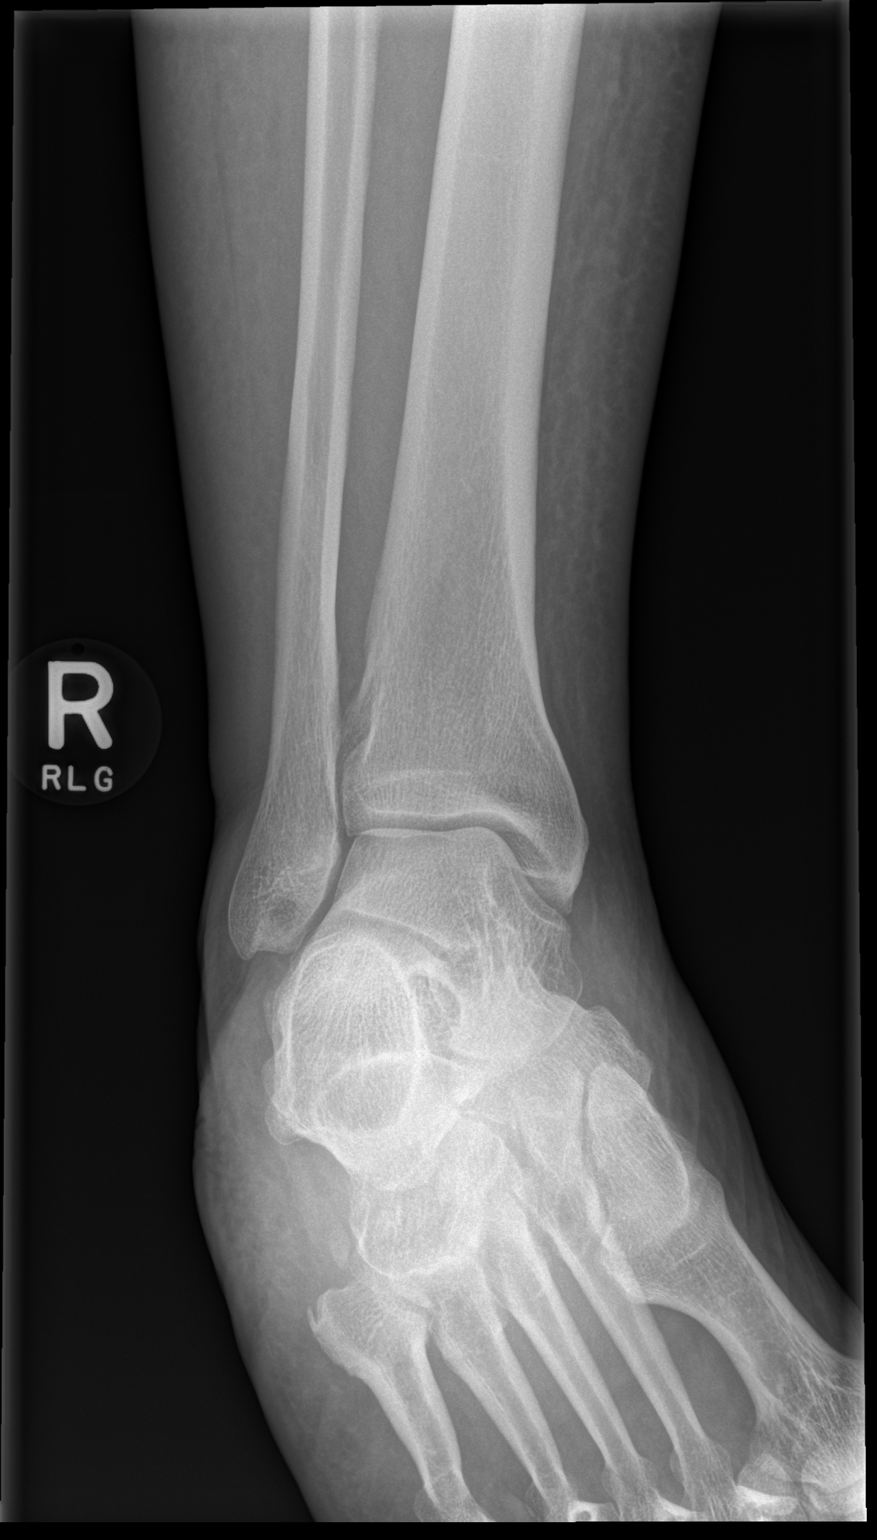

[x ankle lat right]
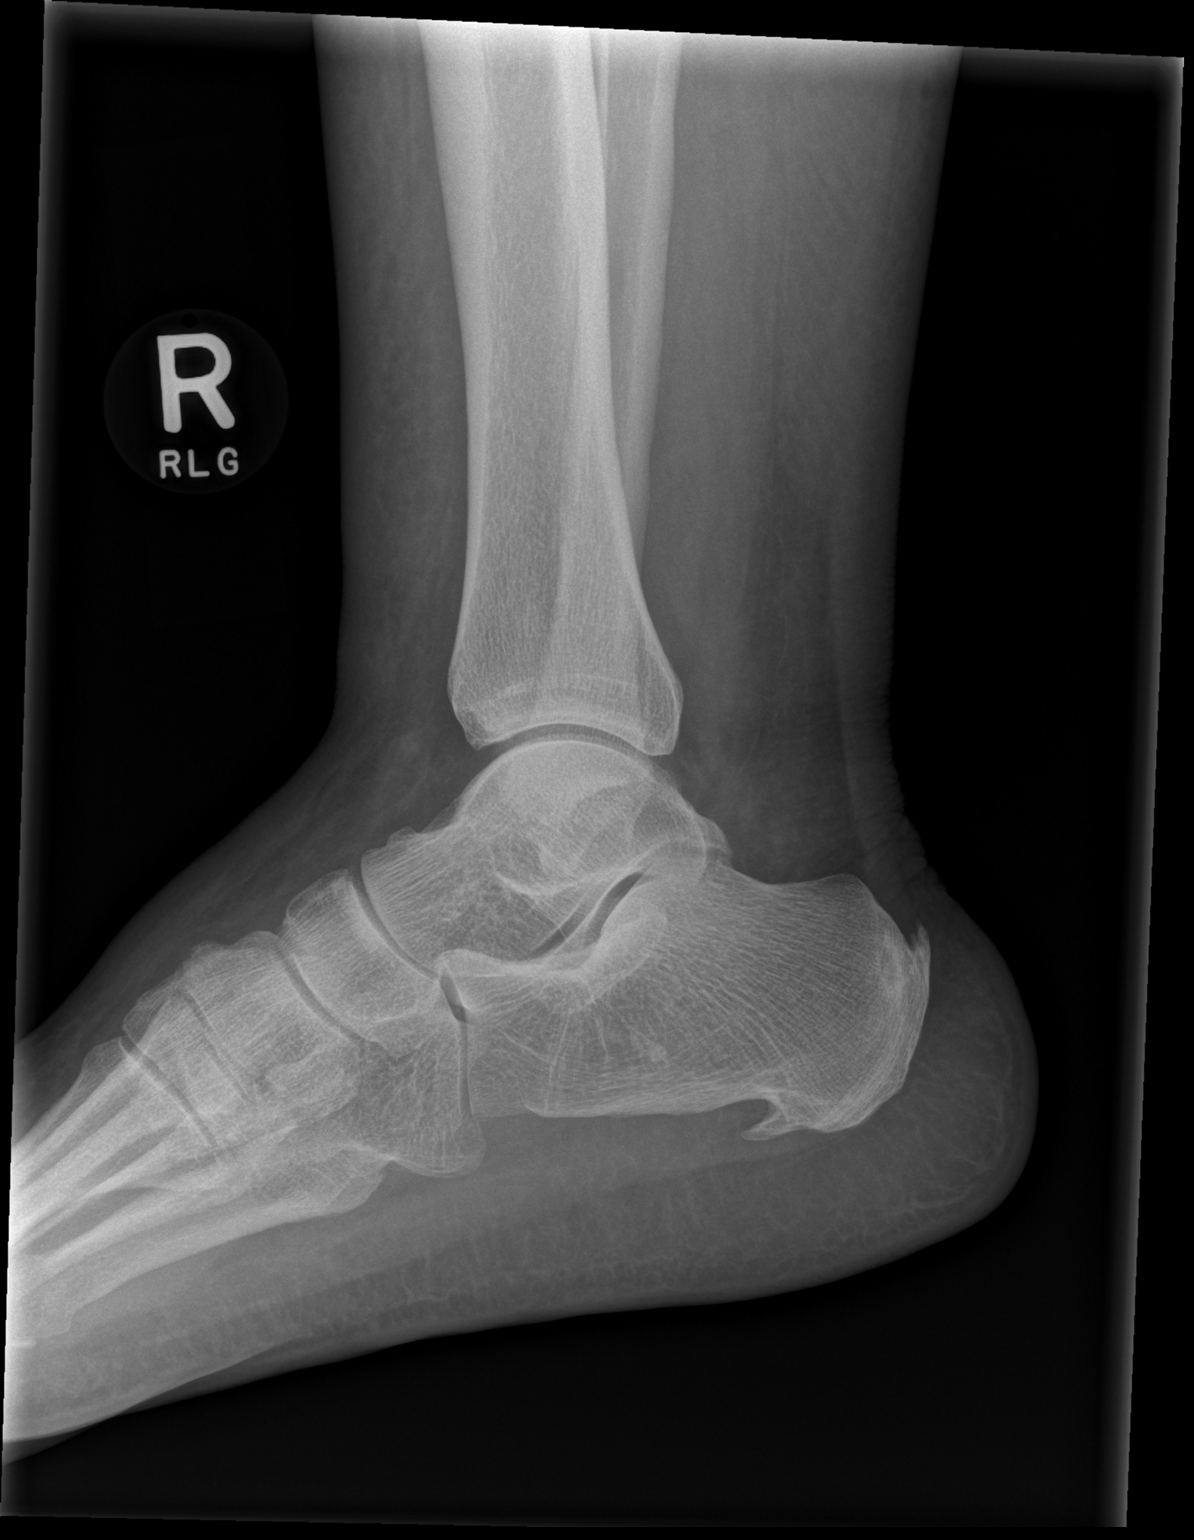

[3 of 3 positions shown; findings below may reference images not displayed]

FINDINGS: The ankle mortise is maintained. No acute ankle fracture. The
subtalar and midfoot the joint spaces are also maintained. Calcaneal
spurring changes.
IMPRESSION: No acute fracture.

## 2019-04-09 ENCOUNTER — Other Ambulatory Visit (HOSPITAL_COMMUNITY): Payer: Self-pay | Admitting: Nephrology

## 2019-04-09 DIAGNOSIS — I429 Cardiomyopathy, unspecified: Secondary | ICD-10-CM

## 2019-04-12 ENCOUNTER — Other Ambulatory Visit: Payer: Self-pay

## 2019-04-12 ENCOUNTER — Ambulatory Visit (HOSPITAL_COMMUNITY)
Admission: RE | Admit: 2019-04-12 | Discharge: 2019-04-12 | Disposition: A | Discharge status: Home / Self Care | Payer: BC Managed Care – PPO | Source: Ambulatory Visit | Attending: Nephrology | Admitting: Nephrology

## 2019-04-12 DIAGNOSIS — I110 Hypertensive heart disease with heart failure: Secondary | ICD-10-CM | POA: Diagnosis not present

## 2019-04-12 DIAGNOSIS — I5041 Acute combined systolic (congestive) and diastolic (congestive) heart failure: Secondary | ICD-10-CM | POA: Insufficient documentation

## 2019-04-12 DIAGNOSIS — I429 Cardiomyopathy, unspecified: Secondary | ICD-10-CM | POA: Insufficient documentation

## 2019-04-12 NOTE — Progress Notes (Signed)
Echocardiogram 2D Echocardiogram has been performed.  Oneal Deputy Thyra Yinger 04/12/2019, 10:56 AM

## 2019-04-16 ENCOUNTER — Other Ambulatory Visit: Payer: Self-pay

## 2019-04-16 ENCOUNTER — Emergency Department (HOSPITAL_BASED_OUTPATIENT_CLINIC_OR_DEPARTMENT_OTHER): Payer: BC Managed Care – PPO

## 2019-04-16 ENCOUNTER — Emergency Department (HOSPITAL_BASED_OUTPATIENT_CLINIC_OR_DEPARTMENT_OTHER)
Admission: EM | Admit: 2019-04-16 | Discharge: 2019-04-16 | Disposition: A | Discharge status: Home / Self Care | Payer: BC Managed Care – PPO | Attending: Emergency Medicine | Admitting: Emergency Medicine

## 2019-04-16 ENCOUNTER — Encounter (HOSPITAL_BASED_OUTPATIENT_CLINIC_OR_DEPARTMENT_OTHER): Payer: Self-pay | Admitting: *Deleted

## 2019-04-16 DIAGNOSIS — F17210 Nicotine dependence, cigarettes, uncomplicated: Secondary | ICD-10-CM | POA: Diagnosis not present

## 2019-04-16 DIAGNOSIS — M25532 Pain in left wrist: Secondary | ICD-10-CM | POA: Diagnosis not present

## 2019-04-16 DIAGNOSIS — I5042 Chronic combined systolic (congestive) and diastolic (congestive) heart failure: Secondary | ICD-10-CM | POA: Insufficient documentation

## 2019-04-16 DIAGNOSIS — Z79899 Other long term (current) drug therapy: Secondary | ICD-10-CM | POA: Diagnosis not present

## 2019-04-16 DIAGNOSIS — N184 Chronic kidney disease, stage 4 (severe): Secondary | ICD-10-CM | POA: Diagnosis not present

## 2019-04-16 DIAGNOSIS — I130 Hypertensive heart and chronic kidney disease with heart failure and stage 1 through stage 4 chronic kidney disease, or unspecified chronic kidney disease: Secondary | ICD-10-CM | POA: Diagnosis not present

## 2019-04-16 MED ORDER — ACETAMINOPHEN 325 MG PO TABS
650.0000 mg | ORAL_TABLET | Freq: Once | ORAL | Status: AC
  Administered 2019-04-16: 650 mg via ORAL
  Filled 2019-04-16: qty 2

## 2019-04-16 MED ORDER — HYDROCODONE-ACETAMINOPHEN 5-325 MG PO TABS: 1.0000 | ORAL_TABLET | ORAL | 0 refills | Status: DC | PRN

## 2019-04-16 NOTE — ED Provider Notes (Addendum)
[facility] EMERGENCY DEPARTMENT Provider Note   CSN: [id number] Arrival date & time: 04/16/19  0805     History Chief Complaint  Patient presents with  . Wrist Pain    Nathan Campbell is a 44 y.o. male.  HPI 44 year old male presents with left wrist pain.  Started all of a sudden when he woke up in the middle the night at around 3 AM.  Previous to this he was feeling fine.  It is a little swollen on the dorsal aspect.  Has not noticed any color change or heat.  No fever.  No weakness or numbness though it is painful to move his wrist.  He does not remember a specific injury. No known history of gout, though a few months ago he had a similar pain in his foot but was not given a specific diagnosis.   Past Medical History:  Diagnosis Date  . Acute combined systolic and diastolic CHF, NYHA class 4 ([place]) 06/10/2016  . Cardiomyopathy ([place]) 06/14/2016  . CHF (congestive heart failure) ([place])   . Dyspnea   . Hypertension   . Hypertensive heart and kidney disease with heart failure ([place]) 06/14/2016  . Hypertensive heart disease with congestive heart failure [facility])     Patient Active Problem List   Diagnosis Date Noted  . Chest pain 05/14/2017  . EKG abnormalities   . Malignant hypertension 10/14/2016  . Hypertensive heart disease with CHF (congestive heart failure) ([place]) 06/14/2016  . NICM (nonischemic cardiomyopathy) ([place]) 06/14/2016  . Renal failure   . Hypertensive urgency 06/10/2016  . CKD (chronic kidney disease), stage IV ([place]) 06/10/2016  . Elevated troponin 06/10/2016  . Elevated transaminase level 06/10/2016  . Normocytic anemia 06/10/2016    Past Surgical History:  Procedure Laterality Date  . NO PAST SURGERIES         Family History  Problem Relation Age of Onset  . Diabetes Father   . Hypertension Father   . Heart disease Cousin   . Heart disease Cousin   . Hypertension Mother   . Hypertension Sister   . Hypertension Paternal Grandmother   .  Diabetes Mellitus II Paternal Grandfather     Social History   Tobacco Use  . Smoking status: Current Every Day Smoker    Packs/day: 0.50    Years: 5.00    Pack years: 2.50    Types: Cigarettes  . Smokeless tobacco: Never Used  Substance Use Topics  . Alcohol use: Yes    Comment: socially  . Drug use: Not Currently    Types: Marijuana    Home Medications Prior to Admission medications   Medication Sig Start Date End Date Taking? Authorizing Provider  acetaminophen (TYLENOL) 325 MG tablet Take 2 tablets (650 mg total) by mouth every 4 (four) hours as needed for headache or mild pain. Patient not taking: Reported on 11/08/2017 05/15/17   Georgette Shell, MD  amLODipine (NORVASC) 10 MG tablet Take 1 tablet (10 mg total) by mouth daily. 06/16/16   Verlee Monte, MD  aspirin EC 81 MG EC tablet Take 1 tablet (81 mg total) by mouth daily. Patient not taking: Reported on 11/08/2017 05/15/17   Georgette Shell, MD  benzonatate (TESSALON) 100 MG capsule Take 1 capsule (100 mg total) by mouth every 8 (eight) hours. 03/18/18   Law, Bea Graff, PA-C  calcitRIOL (ROCALTROL) 0.25 MCG capsule Take 1 capsule (0.25 mcg total) by mouth daily. 06/16/16   Verlee Monte, MD  calcium carbonate (TUMS -  DOSED IN MG ELEMENTAL CALCIUM) 500 MG chewable tablet Chew 1 tablet by mouth 3 (three) times daily with meals.    [provider]  carvedilol (COREG) 25 MG tablet Take 1.5 tablets (37.5 mg total) by mouth 2 (two) times daily with a meal. 10/11/16   Croitoru, Mihai, MD  doxazosin (CARDURA) 8 MG tablet Take 8 mg by mouth at bedtime. 11/03/17   [provider]  furosemide (LASIX) 80 MG tablet Take 1 tablet (80 mg total) by mouth 2 (two) times daily. 06/15/16   Verlee Monte, MD  hydrALAZINE (APRESOLINE) 100 MG tablet Take 1 tablet (100 mg total) by mouth every 8 (eight) hours. 06/15/16   Verlee Monte, MD  HYDROcodone-acetaminophen (NORCO) 5-325 MG tablet Take 1 tablet by mouth every 4 (four) hours as  needed for severe pain. 04/16/19   Sherwood Gambler, MD  isosorbide mononitrate (IMDUR) 60 MG 24 hr tablet Take 1 tablet (60 mg total) by mouth daily. 06/16/16   Verlee Monte, MD  sodium chloride (OCEAN) 0.65 % SOLN nasal spray Place 1 spray into both nostrils as needed for congestion. 03/18/18   Frederica Kuster, PA-C    Allergies    Patient has no known allergies.  Review of Systems   Review of Systems  Constitutional: Negative for fever.  Musculoskeletal: Positive for arthralgias and joint swelling.  Skin: Negative for color change.  Neurological: Negative for weakness and numbness.    Physical Exam Updated Vital Signs BP (!) 157/98 (BP Location: Right Arm)   Pulse 86   Temp 98.5 F (36.9 C) (Oral)   Resp 20   Ht 6' (1.829 m)   Wt (!) 140.6 kg   SpO2 95%   BMI 42.04 kg/m   Physical Exam Vitals and nursing note reviewed.  Constitutional:      Appearance: He is well-developed. He is obese. He is not ill-appearing or diaphoretic.  HENT:     Head: Normocephalic and atraumatic.     Right Ear: External ear normal.     Left Ear: External ear normal.     Nose: Nose normal.  Eyes:     General:        Right eye: No discharge.        Left eye: No discharge.  Cardiovascular:     Rate and Rhythm: Normal rate and regular rhythm.     Pulses:          Radial pulses are 2+ on the left side.  Pulmonary:     Effort: Pulmonary effort is normal.  Abdominal:     General: There is no distension.  Musculoskeletal:     Left forearm: No tenderness.     Left wrist: Swelling (dorsal) and tenderness present. No deformity. Decreased range of motion (causes pain).     Left hand: No swelling or tenderness (no scaphoid tenderness).     Cervical back: Neck supple.     Comments: Limited ROM of hand on strength testing due to pain it causes in left wrist. However, he can both flex and extend all fingers. Normal gross sensation  Skin:    General: Skin is warm and dry.     Findings: No erythema.    Neurological:     Mental Status: He is alert.  Psychiatric:        Mood and Affect: Mood is not anxious.     ED Results / Procedures / Treatments   Labs (all labs ordered are listed, but only abnormal results are displayed)  Labs Reviewed - No data to display  EKG None  Radiology DG Wrist Complete Left  Result Date: 04/16/2019 CLINICAL DATA:  Wrist pain EXAM: LEFT WRIST - COMPLETE 3+ VIEW COMPARISON:  None. FINDINGS: Alignment is anatomic. No acute fracture. Joint spaces are preserved. No intrinsic osseous lesion. IMPRESSION: No acute osseous abnormality. Electronically Signed   By: Macy Mis M.D.   On: 04/16/2019 08:34    Procedures Ultrasound ED Soft Tissue  Date/Time: 04/16/2019 8:42 AM Performed by: Sherwood Gambler, MD Authorized by: Sherwood Gambler, MD   Procedure details:    Indications: limb pain     Transverse view:  Visualized   Longitudinal view:  Visualized   Images: archived     Limitations:  Body habitus Location:    Location: upper extremity     Side:  Left Findings:     no abscess present    no cellulitis present    no foreign body present Comments:     Nonspecific swelling and perhaps some fluid without organized collection/abscess/joint effusion   (including critical care time)  Medications Ordered in ED Medications  acetaminophen (TYLENOL) tablet 650 mg (650 mg Oral Given 04/16/19 0825)    ED Course  I have reviewed the triage vital signs and the nursing notes.  Pertinent labs & imaging results that were available during my care of the patient were reviewed by me and considered in my medical decision making (see chart for details).    MDM Rules/Calculators/A&P                      No clear cause for the patient's atraumatic wrist pain.  Patient tells me he does use his hands/wrists a lot at work and perhaps this is an overuse injury.  Differential is broad given he is only had about 5 hours of symptoms, but with no erythema, excessive  warmth, systemic symptoms or obvious joint effusion on ultrasound/x-ray, I do not think septic joint is highly likely.  Do not think arthrocentesis would be successful or beneficial.  We discussed options, given his renal disease he cannot take NSAIDs unfortunately.  Continue Tylenol and I will give short course of Norco for breakthrough pain.  Otherwise, could be something like gout as he had nonspecific foot pain that was atraumatic a few months ago.  However, we discussed risk/benefits of steroids and given his significant heart failure this might put him at risk for exacerbation or other problems.  He declined steroids at that time which I think is reasonable.  Discharged to follow-up with sports medicine and we discussed return precautions. Final Clinical Impression(s) / ED Diagnoses Final diagnoses:  Acute pain of left wrist    Rx / DC Orders ED Discharge Orders         Ordered    HYDROcodone-acetaminophen (NORCO) 5-325 MG tablet  Every 4 hours PRN     04/16/19 0841           Sherwood Gambler, MD 04/16/19 4696    Sherwood Gambler, MD 04/16/19 [phone]

## 2019-04-16 NOTE — ED Triage Notes (Signed)
Woke this am w left wrist pain  Slight swelling  Denies inj

## 2019-04-16 NOTE — Discharge Instructions (Signed)
If you develop fever, heat to the joint, redness, new or worsening pain, or any other new/concerning symptoms then return to the ER for evaluation.  Do not take Ibuprofen/Advil/Aleve/Motrin/Goody Powders/Naproxen/BC powders/Meloxicam/Diclofenac/Indomethacin and other Nonsteroidal anti-inflammatory medications

## 2019-04-18 ENCOUNTER — Emergency Department (HOSPITAL_BASED_OUTPATIENT_CLINIC_OR_DEPARTMENT_OTHER)
Admission: EM | Admit: 2019-04-18 | Discharge: 2019-04-18 | Disposition: A | Discharge status: Home / Self Care | Payer: BC Managed Care – PPO | Attending: Emergency Medicine | Admitting: Emergency Medicine

## 2019-04-18 ENCOUNTER — Encounter (HOSPITAL_BASED_OUTPATIENT_CLINIC_OR_DEPARTMENT_OTHER): Payer: Self-pay | Admitting: Emergency Medicine

## 2019-04-18 ENCOUNTER — Other Ambulatory Visit: Payer: Self-pay

## 2019-04-18 DIAGNOSIS — F17210 Nicotine dependence, cigarettes, uncomplicated: Secondary | ICD-10-CM | POA: Insufficient documentation

## 2019-04-18 DIAGNOSIS — M25532 Pain in left wrist: Secondary | ICD-10-CM | POA: Insufficient documentation

## 2019-04-18 DIAGNOSIS — I5041 Acute combined systolic (congestive) and diastolic (congestive) heart failure: Secondary | ICD-10-CM | POA: Insufficient documentation

## 2019-04-18 DIAGNOSIS — I130 Hypertensive heart and chronic kidney disease with heart failure and stage 1 through stage 4 chronic kidney disease, or unspecified chronic kidney disease: Secondary | ICD-10-CM | POA: Insufficient documentation

## 2019-04-18 DIAGNOSIS — N184 Chronic kidney disease, stage 4 (severe): Secondary | ICD-10-CM | POA: Diagnosis not present

## 2019-04-18 MED ORDER — DEXAMETHASONE SODIUM PHOSPHATE 10 MG/ML IJ SOLN
10.0000 mg | Freq: Once | INTRAMUSCULAR | Status: AC
  Administered 2019-04-18: 10 mg via INTRAMUSCULAR
  Filled 2019-04-18: qty 1

## 2019-04-18 MED ORDER — HYDROCODONE-ACETAMINOPHEN 5-325 MG PO TABS: 1.0000 | ORAL_TABLET | Freq: Four times a day (QID) | ORAL | 0 refills | Status: AC | PRN

## 2019-04-18 MED FILL — HYDROCODON-APAP 5-325: 5-325 | 3 days supply | Qty: 10 | Fill #0

## 2019-04-18 NOTE — Discharge Instructions (Signed)
You were seen in the emergency department today with left wrist pain.  You should continue supportive care at home with warm compress and Tylenol as needed for pain.  He may take hydrocodone as needed for severe pain but this medicine does carry the risk of dependency.  Do not take unless you are having severe discomfort.  Use the wrist splint provided and please call the sports medicine doctor today to schedule the next available appointment.  You should return to the emergency department if you develop redness or warmth over the wrist along with fever.

## 2019-04-18 NOTE — ED Triage Notes (Signed)
Pt seen 2 days ago for left wrist pain.  Is wearing velcro splint.  Sts it is still hurting and he can't use it.  His work note puts him back to work tomorrow and he can't use his wrist.

## 2019-04-18 NOTE — ED Notes (Signed)
ED Provider at bedside. 

## 2019-04-18 NOTE — ED Provider Notes (Signed)
Emergency Department Provider Note   I have reviewed the triage vital signs and the nursing notes.   HISTORY  Chief Complaint Wrist Pain   HPI Nathan Campbell is a 44 y.o. male with PMH reviewed below presents to the emergency department with continued left wrist pain.  He was seen in the emergency department 2 days ago with atraumatic wrist pain and mild swelling.  X-rays at that time were normal.  He was placed in a wrist splint and advised to follow-up with sports medicine.  He tells me that his pain is overall improved but he still has pain with moving the wrist and that his work note is getting ready to run out.  He does not feel like he can do his job currently with heavy lifting required.  He has not developed fever or wrist redness.  No obvious injury.  He denies any fevers or shaking chills. No numbness/tingling. BP has been well controlled at home and he does not have HTN.    Past Medical History:  Diagnosis Date  . Acute combined systolic and diastolic CHF, NYHA class 4 ([place]) 06/10/2016  . Cardiomyopathy ([place]) 06/14/2016  . CHF (congestive heart failure) ([place])   . Dyspnea   . Hypertension   . Hypertensive heart and kidney disease with heart failure ([place]) 06/14/2016  . Hypertensive heart disease with congestive heart failure [facility])     Patient Active Problem List   Diagnosis Date Noted  . Chest pain 05/14/2017  . EKG abnormalities   . Malignant hypertension 10/14/2016  . Hypertensive heart disease with CHF (congestive heart failure) ([place]) 06/14/2016  . NICM (nonischemic cardiomyopathy) ([place]) 06/14/2016  . Renal failure   . Hypertensive urgency 06/10/2016  . CKD (chronic kidney disease), stage IV ([place]) 06/10/2016  . Elevated troponin 06/10/2016  . Elevated transaminase level 06/10/2016  . Normocytic anemia 06/10/2016    Past Surgical History:  Procedure Laterality Date  . NO PAST SURGERIES      Allergies Patient has no known allergies.  Family History    Problem Relation Age of Onset  . Diabetes Father   . Hypertension Father   . Heart disease Cousin   . Heart disease Cousin   . Hypertension Mother   . Hypertension Sister   . Hypertension Paternal Grandmother   . Diabetes Mellitus II Paternal Grandfather     Social History Social History   Tobacco Use  . Smoking status: Current Every Day Smoker    Packs/day: 0.50    Years: 5.00    Pack years: 2.50    Types: Cigarettes  . Smokeless tobacco: Never Used  Substance Use Topics  . Alcohol use: Yes    Comment: socially  . Drug use: Not Currently    Types: Marijuana    Review of Systems  Constitutional: No fever/chills Genitourinary: Negative for dysuria. Musculoskeletal: Negative for back pain. Positive left wrist pain.  Skin: Negative for rash. Neurological: Negative for numbness.  10-point ROS otherwise negative.  ____________________________________________   PHYSICAL EXAM:  VITAL SIGNS: ED Triage Vitals  Enc Vitals Group     BP 04/18/19 1108 125/78     Pulse Rate 04/18/19 1108 100     Resp 04/18/19 1108 16     Temp 04/18/19 1108 99 F (37.2 C)     Temp Source 04/18/19 1108 Oral     SpO2 04/18/19 1108 97 %     Weight 04/18/19 1109 297 lb 8 oz (134.9 kg)     Height 04/18/19  1109 6' (1.829 m)   Constitutional: Alert and oriented. Well appearing and in no acute distress. Eyes: Conjunctivae are normal.  Head: Atraumatic. Nose: No congestion/rhinnorhea. Mouth/Throat: Mucous membranes are moist.  Neck: No stridor.   Cardiovascular: Good peripheral circulation. Grossly normal heart sounds.   Respiratory: Normal respiratory effort.   Gastrointestinal: No distention.  Musculoskeletal: Very mild swelling over the left wrist with range of motion limited both with flexion and extension due to pain.  Patient is able to make a weak fist and extend fingers fully.  There is no redness or warmth over the wrist.  No tenderness to palpation over the flexor/extensor  compartment in the forearm.  No skin breakdown or lesions.  Neurologic:  Normal speech and language. No gross focal neurologic deficits are appreciated.  Skin:  Skin is warm, dry and intact. No rash noted.  ____________________________________________   PROCEDURES  Procedure(s) performed:   Procedures  None  ____________________________________________   INITIAL IMPRESSION / ASSESSMENT AND PLAN / ED COURSE  Pertinent labs & imaging results that were available during my care of the patient were reviewed by me and considered in my medical decision making (see chart for details).   Patient presents to the emergency department with left wrist pain.  Pain is overall improved but does not feel ready to return to work.  He does have limited range of motion of the wrist.  My suspicion for septic arthritis in this case is exceedingly low based on exam and history.  Differential includes inflammatory type arthritis versus overuse type injury due to the type of work he does.  We discussed the risk/benefit of single dose steroid.  Patient's blood pressure here is normal.  Will give single IM Decadron shot and have him continue using Tylenol.  Will extend his pain medication prescription and have advised very close follow-up with sports medicine.  We will extend his time out of work as well. Discussed signs/symptoms of septic joint and ED return precautions in detail.    ____________________________________________  FINAL CLINICAL IMPRESSION(S) / ED DIAGNOSES  Final diagnoses:  Acute pain of left wrist    MEDICATIONS GIVEN DURING THIS VISIT:  Medications  dexamethasone (DECADRON) injection 10 mg (has no administration in time range)    Note:  This document was prepared using Dragon voice recognition software and may include unintentional dictation errors.  Nanda Quinton, MD, [facility] Emergency Medicine    Lyndee Herbst, Wonda Olds, MD 04/18/19 1128

## 2019-04-24 ENCOUNTER — Other Ambulatory Visit: Payer: Self-pay

## 2019-04-24 ENCOUNTER — Ambulatory Visit: Payer: Self-pay

## 2019-04-24 ENCOUNTER — Encounter: Payer: Self-pay | Admitting: Family Medicine

## 2019-04-24 ENCOUNTER — Ambulatory Visit (INDEPENDENT_AMBULATORY_CARE_PROVIDER_SITE_OTHER): Payer: BC Managed Care – PPO | Admitting: Family Medicine

## 2019-04-24 VITALS — BP 149/99 | HR 84 | Ht 72.0 in | Wt 300.0 lb

## 2019-04-24 DIAGNOSIS — M10332 Gout due to renal impairment, left wrist: Secondary | ICD-10-CM

## 2019-04-24 DIAGNOSIS — M25532 Pain in left wrist: Secondary | ICD-10-CM

## 2019-04-24 MED ORDER — PREDNISONE 20 MG PO TABS: ORAL_TABLET | ORAL | 0 refills | Status: DC

## 2019-04-24 MED FILL — predniSONE 20 MG TABS: 20 | 10 days supply | Qty: 18 | Fill #0

## 2019-04-24 NOTE — Assessment & Plan Note (Signed)
Findings on ultrasound suggest a gouty source.  He does have chronic kidney disease which may be contributing. -Prednisone. -Counseled on supportive care. -Counseled on gout. -Obtain uric acid at follow-up.

## 2019-04-24 NOTE — Patient Instructions (Signed)
Nice to meet you Please try the prednisone This website talks about gout. [email or web address] Please decrease the amount of soda and beer.  Please send me a message in MyChart with any questions or updates.  Please see me back in 3-4 weeks.   --Dr. Raeford Razor

## 2019-04-24 NOTE — Progress Notes (Signed)
Nathan Campbell - 44 y.o. male MRN [id number]  Date of birth: 22-Oct-1975  SUBJECTIVE:  Including CC & ROS.  Chief Complaint  Patient presents with  . Wrist Pain    left wrist x 2 weeks    Nathan Campbell is a 44 y.o. male that is presenting with 2 weeks of left dorsal wrist pain.  He is having some swelling of the dorsum of the hand.  There is no inciting event or trauma.  He was seen in the emergency department and provided a wrist brace.  Pain seems to be the same.  Has problems with flexion and extension.  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  Currently does not exercise.  No history of gout or any family history.  Independent review of the left wrist x-ray from 2/1 shows no acute abnormality.   Review of Systems See HPI   HISTORY: Past Medical, Surgical, Social, and Family History Reviewed & Updated per EMR.   Pertinent Historical Findings include:  Past Medical History:  Diagnosis Date  . Acute combined systolic and diastolic CHF, NYHA class 4 ([place]) 06/10/2016  . Cardiomyopathy ([place]) 06/14/2016  . CHF (congestive heart failure) ([place])   . Dyspnea   . Hypertension   . Hypertensive heart and kidney disease with heart failure ([place]) 06/14/2016  . Hypertensive heart disease with congestive heart failure [facility])     Past Surgical History:  Procedure Laterality Date  . NO PAST SURGERIES      No Known Allergies  Family History  Problem Relation Age of Onset  . Diabetes Father   . Hypertension Father   . Heart disease Cousin   . Heart disease Cousin   . Hypertension Mother   . Hypertension Sister   . Hypertension Paternal Grandmother   . Diabetes Mellitus II Paternal Grandfather      Social History   Socioeconomic History  . Marital status: Single    Spouse name: Not on file  . Number of children: Not on file  . Years of education: Not on file  . Highest education level: Not on file  Occupational History  . Not on file  Tobacco Use  . Smoking status: Current Every Day  Smoker    Packs/day: 0.50    Years: 5.00    Pack years: 2.50    Types: Cigarettes  . Smokeless tobacco: Never Used  Substance and Sexual Activity  . Alcohol use: Yes    Comment: socially  . Drug use: Not Currently    Types: Marijuana  . Sexual activity: Not on file  Other Topics Concern  . Not on file  Social History Narrative   Epworth Sleepiness Score: 6   Social Determinants of Health   Financial Resource Strain:   . Difficulty of Paying Living Expenses: Not on file  Food Insecurity:   . Worried About Charity fundraiser in the Last Year: Not on file  . Ran Out of Food in the Last Year: Not on file  Transportation Needs:   . Lack of Transportation (Medical): Not on file  . Lack of Transportation (Non-Medical): Not on file  Physical Activity:   . Days of Exercise per Week: Not on file  . Minutes of Exercise per Session: Not on file  Stress:   . Feeling of Stress : Not on file  Social Connections:   . Frequency of Communication with Friends and Family: Not on file  . Frequency of Social Gatherings with Friends and Family: Not on file  .  Attends Religious Services: Not on file  . Active Member of Clubs or Organizations: Not on file  . Attends Archivist Meetings: Not on file  . Marital Status: Not on file  Intimate Partner Violence:   . Fear of Current or Ex-Partner: Not on file  . Emotionally Abused: Not on file  . Physically Abused: Not on file  . Sexually Abused: Not on file     PHYSICAL EXAM:  VS: BP (!) 149/99   Pulse 84   Ht 6' (1.829 m)   Wt 300 lb (136.1 kg)   BMI 40.69 kg/m  Physical Exam Gen: NAD, alert, cooperative with exam, well-appearing ENT: normal lips, normal nasal mucosa,  Eye: normal EOM, normal conjunctiva and lids Skin: no rashes, no areas of induration  Neuro: normal tone, normal sensation to touch Psych:  normal insight, alert and oriented MSK:  Left wrist/hand: Obvious swelling of the dorsum of the hand. Limited  extension and flexion of the fingers secondary to pain. Pain with passive wrist flexion and extension. No ecchymosis. Neurovascularly intact  Limited ultrasound: Left wrist/hand:  Soft tissue swelling of the dorsum of the hand superficial to the metacarpals. No obvious effusion within the tendons themselves. Mild degenerative change of the [facility] joint. Large effusion within the carpal bones with a hyper echoic translucency to suggest a crystal.  Summary: Findings suggestive of gouty contribution in the wrist and dorsum of the hand  Ultrasound and interpretation by Clearance Coots, MD    ASSESSMENT & PLAN:   Acute gout due to renal impairment involving left wrist Findings on ultrasound suggest a gouty source.  He does have chronic kidney disease which may be contributing. -Prednisone. -Counseled on supportive care. -Counseled on gout. -Obtain uric acid at follow-up.

## 2019-04-30 ENCOUNTER — Encounter: Payer: Self-pay | Admitting: Family Medicine

## 2019-04-30 ENCOUNTER — Telehealth: Payer: Self-pay | Admitting: Family Medicine

## 2019-04-30 NOTE — Telephone Encounter (Signed)
Provided work note.   Rosemarie Ax, MD [facility] 04/30/2019, 9:47 AM

## 2019-04-30 NOTE — Telephone Encounter (Signed)
Patient stopped by the office requesting a work note stating that he was unable to work from the date of his visit, 04-24-19, to 04-29-2019, but is clear to return to work today.

## 2019-05-09 ENCOUNTER — Encounter: Payer: Self-pay | Admitting: Family Medicine

## 2019-05-09 ENCOUNTER — Other Ambulatory Visit: Payer: Self-pay

## 2019-05-09 ENCOUNTER — Ambulatory Visit (INDEPENDENT_AMBULATORY_CARE_PROVIDER_SITE_OTHER): Payer: BC Managed Care – PPO | Admitting: Family Medicine

## 2019-05-09 VITALS — BP 142/85 | HR 79 | Ht 72.0 in | Wt 300.0 lb

## 2019-05-09 DIAGNOSIS — M10332 Gout due to renal impairment, left wrist: Secondary | ICD-10-CM

## 2019-05-09 NOTE — Patient Instructions (Signed)
Good to see you Please try ice  I will call with the results   Please send me a message in MyChart with any questions or updates.  Please see me back in 6 weeks.   --Dr. Raeford Razor

## 2019-05-09 NOTE — Progress Notes (Signed)
Nathan Campbell - 44 y.o. male MRN [id number]  Date of birth: 1975/08/07  SUBJECTIVE:  Including CC & ROS.  Chief Complaint  Patient presents with  . Follow-up    follow up for left wrist    Nathan Campbell is a 44 y.o. male that is presenting with acute worsening of his left wrist pain.  He had significant improvement while he was on the prednisone.  Since that time his left wrist started hurting and having more swelling over the dorsum of the wrist again.  Denies any inciting event or trauma.   Review of Systems See HPI   HISTORY: Past Medical, Surgical, Social, and Family History Reviewed & Updated per EMR.   Pertinent Historical Findings include:  Past Medical History:  Diagnosis Date  . Acute combined systolic and diastolic CHF, NYHA class 4 ([place]) 06/10/2016  . Cardiomyopathy ([place]) 06/14/2016  . CHF (congestive heart failure) ([place])   . Dyspnea   . Hypertension   . Hypertensive heart and kidney disease with heart failure ([place]) 06/14/2016  . Hypertensive heart disease with congestive heart failure [facility])     Past Surgical History:  Procedure Laterality Date  . NO PAST SURGERIES      Family History  Problem Relation Age of Onset  . Diabetes Father   . Hypertension Father   . Heart disease Cousin   . Heart disease Cousin   . Hypertension Mother   . Hypertension Sister   . Hypertension Paternal Grandmother   . Diabetes Mellitus II Paternal Grandfather     Social History   Socioeconomic History  . Marital status: Single    Spouse name: Not on file  . Number of children: Not on file  . Years of education: Not on file  . Highest education level: Not on file  Occupational History  . Not on file  Tobacco Use  . Smoking status: Current Every Day Smoker    Packs/day: 0.50    Years: 5.00    Pack years: 2.50    Types: Cigarettes  . Smokeless tobacco: Never Used  Substance and Sexual Activity  . Alcohol use: Yes    Comment: socially  . Drug use: Not Currently   Types: Marijuana  . Sexual activity: Not on file  Other Topics Concern  . Not on file  Social History Narrative   Epworth Sleepiness Score: 6   Social Determinants of Health   Financial Resource Strain:   . Difficulty of Paying Living Expenses: Not on file  Food Insecurity:   . Worried About Charity fundraiser in the Last Year: Not on file  . Ran Out of Food in the Last Year: Not on file  Transportation Needs:   . Lack of Transportation (Medical): Not on file  . Lack of Transportation (Non-Medical): Not on file  Physical Activity:   . Days of Exercise per Week: Not on file  . Minutes of Exercise per Session: Not on file  Stress:   . Feeling of Stress : Not on file  Social Connections:   . Frequency of Communication with Friends and Family: Not on file  . Frequency of Social Gatherings with Friends and Family: Not on file  . Attends Religious Services: Not on file  . Active Member of Clubs or Organizations: Not on file  . Attends Archivist Meetings: Not on file  . Marital Status: Not on file  Intimate Partner Violence:   . Fear of Current or Ex-Partner: Not on file  .  Emotionally Abused: Not on file  . Physically Abused: Not on file  . Sexually Abused: Not on file     PHYSICAL EXAM:  VS: BP (!) 142/85   Pulse 79   Ht 6' (1.829 m)   Wt 300 lb (136.1 kg)   BMI 40.69 kg/m  Physical Exam Gen: NAD, alert, cooperative with exam, well-appearing MSK:  Left wrist: Swelling occurring of the dorsum of the carpal bones. Limited flexion and extension. No streaking. Neurovascularly intact     ASSESSMENT & PLAN:   Acute gout due to renal impairment involving left wrist Had significant improvement while he was on the prednisone and his symptoms have recently started acting up again. -Uric acid and ANA panel. -Provided work note. -May need to consider allopurinol if levels are elevated.

## 2019-05-09 NOTE — Assessment & Plan Note (Signed)
Had significant improvement while he was on the prednisone and his symptoms have recently started acting up again. -Uric acid and ANA panel. -Provided work note. -May need to consider allopurinol if levels are elevated.

## 2019-05-10 ENCOUNTER — Telehealth: Payer: Self-pay | Admitting: Family Medicine

## 2019-05-10 MED ORDER — PREDNISONE 20 MG PO TABS: ORAL_TABLET | ORAL | 0 refills | Status: DC

## 2019-05-10 MED ORDER — ALLOPURINOL 100 MG PO TABS: 100.0000 mg | ORAL_TABLET | Freq: Every day | ORAL | 1 refills | Status: DC

## 2019-05-10 NOTE — Telephone Encounter (Signed)
Left VM for patient. If he calls back please have him speak with a nurse/CMA and inform that his uric acid is elevated. We will need to start allopurinol and prednisone. I have sent in prednisone and allopurinol that he needs to start at the same time. He will need to follow up in 2 months or so to recheck his uric acid. I have provided a new work note.   If any questions then please take the best time and phone number to call and I will try to call him back.   Rosemarie Ax, MD [facility] 05/10/2019, 8:02 AM

## 2019-05-11 LAB — URIC ACID: Uric Acid: 13.1 mg/dL — ABNORMAL HIGH (ref 3.8–8.4)

## 2019-05-11 LAB — ANA,IFA RA DIAG PNL W/RFLX TIT/PATN
ANA Titer 1: NEGATIVE
Cyclic Citrullin Peptide Ab: 5 units (ref 0–19)
Rhuematoid fact SerPl-aCnc: <10 IU/mL (ref 0.0–13.9)

## 2019-05-15 ENCOUNTER — Ambulatory Visit: Payer: BC Managed Care – PPO | Admitting: Family Medicine

## 2019-06-20 ENCOUNTER — Ambulatory Visit: Payer: BC Managed Care – PPO | Admitting: Family Medicine

## 2019-06-20 NOTE — Progress Notes (Deleted)
  Yorel Redder - 44 y.o. male MRN [id number]  Date of birth: [birth year]  SUBJECTIVE:  Including CC & ROS.  No chief complaint on file.   Anden Bartolo is a 44 y.o. male that is  ***.  ***   Review of Systems See HPI   HISTORY: Past Medical, Surgical, Social, and Family History Reviewed & Updated per EMR.   Pertinent Historical Findings include:  Past Medical History:  Diagnosis Date  . Acute combined systolic and diastolic CHF, NYHA class 4 ([place]) 06/10/2016  . Cardiomyopathy ([place]) 06/14/2016  . CHF (congestive heart failure) ([place])   . Dyspnea   . Hypertension   . Hypertensive heart and kidney disease with heart failure ([place]) 06/14/2016  . Hypertensive heart disease with congestive heart failure [facility])     Past Surgical History:  Procedure Laterality Date  . NO PAST SURGERIES      Family History  Problem Relation Age of Onset  . Diabetes Father   . Hypertension Father   . Heart disease Cousin   . Heart disease Cousin   . Hypertension Mother   . Hypertension Sister   . Hypertension Paternal Grandmother   . Diabetes Mellitus II Paternal Grandfather     Social History   Socioeconomic History  . Marital status: Single    Spouse name: Not on file  . Number of children: Not on file  . Years of education: Not on file  . Highest education level: Not on file  Occupational History  . Not on file  Tobacco Use  . Smoking status: Current Every Day Smoker    Packs/day: 0.50    Years: 5.00    Pack years: 2.50    Types: Cigarettes  . Smokeless tobacco: Never Used  Substance and Sexual Activity  . Alcohol use: Yes    Comment: socially  . Drug use: Not Currently    Types: Marijuana  . Sexual activity: Not on file  Other Topics Concern  . Not on file  Social History Narrative   Epworth Sleepiness Score: 6   Social Determinants of Health   Financial Resource Strain:   . Difficulty of Paying Living Expenses:   Food Insecurity:   . Worried About Sales executive in the Last Year:   . Arboriculturist in the Last Year:   Transportation Needs:   . Film/video editor (Medical):   Marland Kitchen Lack of Transportation (Non-Medical):   Physical Activity:   . Days of Exercise per Week:   . Minutes of Exercise per Session:   Stress:   . Feeling of Stress :   Social Connections:   . Frequency of Communication with Friends and Family:   . Frequency of Social Gatherings with Friends and Family:   . Attends Religious Services:   . Active Member of Clubs or Organizations:   . Attends Archivist Meetings:   Marland Kitchen Marital Status:   Intimate Partner Violence:   . Fear of Current or Ex-Partner:   . Emotionally Abused:   Marland Kitchen Physically Abused:   . Sexually Abused:      PHYSICAL EXAM:  VS: There were no vitals taken for this visit. Physical Exam Gen: NAD, alert, cooperative with exam, well-appearing MSK:  ***      ASSESSMENT & PLAN:   No problem-specific Assessment & Plan notes found for this encounter.

## 2019-07-04 ENCOUNTER — Other Ambulatory Visit: Payer: Self-pay

## 2019-07-04 ENCOUNTER — Ambulatory Visit (INDEPENDENT_AMBULATORY_CARE_PROVIDER_SITE_OTHER): Payer: BC Managed Care – PPO | Admitting: Family Medicine

## 2019-07-04 ENCOUNTER — Encounter: Payer: Self-pay | Admitting: Family Medicine

## 2019-07-04 VITALS — BP 136/81 | HR 90 | Ht 72.0 in | Wt 300.0 lb

## 2019-07-04 DIAGNOSIS — M545 Low back pain, unspecified: Secondary | ICD-10-CM | POA: Insufficient documentation

## 2019-07-04 MED ORDER — METHOCARBAMOL 500 MG PO TABS: 500.0000 mg | ORAL_TABLET | Freq: Two times a day (BID) | ORAL | 1 refills | Status: DC | PRN

## 2019-07-04 MED ORDER — NAPROXEN 500 MG PO TABS: 500.0000 mg | ORAL_TABLET | Freq: Two times a day (BID) | ORAL | 3 refills | Status: DC | PRN

## 2019-07-04 MED FILL — NAPROXEN 500 MG TABS: 500 | 30 days supply | Qty: 60 | Fill #0

## 2019-07-04 MED FILL — METHOCARBAMOL 500 MG TABLET: 500 | 30 days supply | Qty: 60 | Fill #0

## 2019-07-04 NOTE — Assessment & Plan Note (Signed)
Acute pain seemingly more muscle related.  Seems worse within a day after working. -Counseled on home exercise therapy and supportive care. -Naproxen.  Would likely need updated lab work at some point. -Robaxin. -Could consider imaging or physical therapy.

## 2019-07-04 NOTE — Patient Instructions (Signed)
Good to see you Please try the stretches. Please try to do these at breaks at work as well.  Please try heat  Please use the naproxen or robaxin as needed.   Please send me a message in MyChart with any questions or updates.  Please see me back in 4 weeks or sooner if needed.   --Dr. Raeford Razor

## 2019-07-04 NOTE — Progress Notes (Signed)
Nathan Campbell - 44 y.o. male MRN [id number]  Date of birth: 1975/10/11  SUBJECTIVE:  Including CC & ROS.  Chief Complaint  Patient presents with  . Back Pain    left-sided low back    Nathan Campbell is a 44 y.o. male that is presenting with acute low back pain.  This is gotten worse over the past month.  Seems to be worse at the end of the day after he gets off work.  He feels pain with transitioning from sitting to standing.  Denies any radicular symptoms.  No specific injury.  No history of surgery.   Review of Systems See HPI   HISTORY: Past Medical, Surgical, Social, and Family History Reviewed & Updated per EMR.   Pertinent Historical Findings include:  Past Medical History:  Diagnosis Date  . Acute combined systolic and diastolic CHF, NYHA class 4 ([place]) 06/10/2016  . Cardiomyopathy ([place]) 06/14/2016  . CHF (congestive heart failure) ([place])   . Dyspnea   . Hypertension   . Hypertensive heart and kidney disease with heart failure ([place]) 06/14/2016  . Hypertensive heart disease with congestive heart failure [facility])     Past Surgical History:  Procedure Laterality Date  . NO PAST SURGERIES      Family History  Problem Relation Age of Onset  . Diabetes Father   . Hypertension Father   . Heart disease Cousin   . Heart disease Cousin   . Hypertension Mother   . Hypertension Sister   . Hypertension Paternal Grandmother   . Diabetes Mellitus II Paternal Grandfather     Social History   Socioeconomic History  . Marital status: Single    Spouse name: Not on file  . Number of children: Not on file  . Years of education: Not on file  . Highest education level: Not on file  Occupational History  . Not on file  Tobacco Use  . Smoking status: Current Every Day Smoker    Packs/day: 0.50    Years: 5.00    Pack years: 2.50    Types: Cigarettes  . Smokeless tobacco: Never Used  Substance and Sexual Activity  . Alcohol use: Yes    Comment: socially  . Drug use: Not  Currently    Types: Marijuana  . Sexual activity: Not on file  Other Topics Concern  . Not on file  Social History Narrative   Epworth Sleepiness Score: 6   Social Determinants of Health   Financial Resource Strain:   . Difficulty of Paying Living Expenses:   Food Insecurity:   . Worried About Charity fundraiser in the Last Year:   . Arboriculturist in the Last Year:   Transportation Needs:   . Film/video editor (Medical):   Marland Kitchen Lack of Transportation (Non-Medical):   Physical Activity:   . Days of Exercise per Week:   . Minutes of Exercise per Session:   Stress:   . Feeling of Stress :   Social Connections:   . Frequency of Communication with Friends and Family:   . Frequency of Social Gatherings with Friends and Family:   . Attends Religious Services:   . Active Member of Clubs or Organizations:   . Attends Archivist Meetings:   Marland Kitchen Marital Status:   Intimate Partner Violence:   . Fear of Current or Ex-Partner:   . Emotionally Abused:   Marland Kitchen Physically Abused:   . Sexually Abused:      PHYSICAL EXAM:  VS:  BP 136/81   Pulse 90   Ht 6' (1.829 m)   Wt 300 lb (136.1 kg)   BMI 40.69 kg/m  Physical Exam Gen: NAD, alert, cooperative with exam, well-appearing MSK:  Back: Normal flexion and extension. Normal internal and external rotation of the hips. Normal strength resistance. Negative straight leg raise. Neurovascularly intact     ASSESSMENT & PLAN:   Acute bilateral low back pain without sciatica Acute pain seemingly more muscle related.  Seems worse within a day after working. -Counseled on home exercise therapy and supportive care. -Naproxen.  Would likely need updated lab work at some point. -Robaxin. -Could consider imaging or physical therapy.

## 2019-07-18 ENCOUNTER — Other Ambulatory Visit: Payer: Self-pay

## 2019-07-18 ENCOUNTER — Emergency Department (HOSPITAL_BASED_OUTPATIENT_CLINIC_OR_DEPARTMENT_OTHER)
Admission: EM | Admit: 2019-07-18 | Discharge: 2019-07-18 | Disposition: A | Discharge status: Home / Self Care | Payer: BC Managed Care – PPO | Attending: Emergency Medicine | Admitting: Emergency Medicine

## 2019-07-18 ENCOUNTER — Encounter (HOSPITAL_BASED_OUTPATIENT_CLINIC_OR_DEPARTMENT_OTHER): Payer: Self-pay | Admitting: Emergency Medicine

## 2019-07-18 DIAGNOSIS — H1089 Other conjunctivitis: Secondary | ICD-10-CM | POA: Diagnosis not present

## 2019-07-18 DIAGNOSIS — H5789 Other specified disorders of eye and adnexa: Secondary | ICD-10-CM | POA: Diagnosis present

## 2019-07-18 DIAGNOSIS — F17210 Nicotine dependence, cigarettes, uncomplicated: Secondary | ICD-10-CM | POA: Insufficient documentation

## 2019-07-18 DIAGNOSIS — N184 Chronic kidney disease, stage 4 (severe): Secondary | ICD-10-CM | POA: Diagnosis not present

## 2019-07-18 DIAGNOSIS — I130 Hypertensive heart and chronic kidney disease with heart failure and stage 1 through stage 4 chronic kidney disease, or unspecified chronic kidney disease: Secondary | ICD-10-CM | POA: Insufficient documentation

## 2019-07-18 DIAGNOSIS — Z79899 Other long term (current) drug therapy: Secondary | ICD-10-CM | POA: Insufficient documentation

## 2019-07-18 DIAGNOSIS — I5040 Unspecified combined systolic (congestive) and diastolic (congestive) heart failure: Secondary | ICD-10-CM | POA: Insufficient documentation

## 2019-07-18 DIAGNOSIS — H109 Unspecified conjunctivitis: Secondary | ICD-10-CM

## 2019-07-18 MED ORDER — ERYTHROMYCIN 5 MG/GM OP OINT: TOPICAL_OINTMENT | OPHTHALMIC | 0 refills | Status: DC

## 2019-07-18 MED FILL — ERYTHROMYCIN EYE OINTMENT: 5 | 10 days supply | Qty: 4 | Fill #0

## 2019-07-18 NOTE — ED Triage Notes (Signed)
Left eye drainage and swelling for a month.  Is now itching as well.

## 2019-07-18 NOTE — ED Provider Notes (Signed)
[facility] EMERGENCY DEPARTMENT Provider Note   CSN: [id number] Arrival date & time: 07/18/19  8250     History Chief Complaint  Patient presents with  . Eye Problem    Nathan Campbell is a 44 y.o. male.  44 y.o male with a PMH of CHF, Cardiomyopathy presents to the ED with a chief complaint of left eye discharge for 1 month.  Patient describes this as a green, purulent discharge from his left eye, states that his eye feels irritated along with itchy.  Has had a similar episode in the past when she was diagnosed with bacterial conjunctivitis.  Does have small children at home.  Has not tried a medication for improvement in his symptoms.  Denies any fever, URIs, pain with eye movement, shortness of breath.  The history is provided by the patient.  Eye Problem Associated symptoms: discharge, itching and redness   Associated symptoms: no photophobia        Past Medical History:  Diagnosis Date  . Acute combined systolic and diastolic CHF, NYHA class 4 ([place]) 06/10/2016  . Cardiomyopathy ([place]) 06/14/2016  . CHF (congestive heart failure) ([place])   . Dyspnea   . Hypertension   . Hypertensive heart and kidney disease with heart failure ([place]) 06/14/2016  . Hypertensive heart disease with congestive heart failure [facility])     Patient Active Problem List   Diagnosis Date Noted  . Acute bilateral low back pain without sciatica 07/04/2019  . Acute gout due to renal impairment involving left wrist 04/24/2019  . Malignant hypertension 10/14/2016  . Hypertensive heart disease with CHF (congestive heart failure) ([place]) 06/14/2016  . NICM (nonischemic cardiomyopathy) ([place]) 06/14/2016  . Renal failure   . Hypertensive urgency 06/10/2016  . CKD (chronic kidney disease), stage IV ([place]) 06/10/2016  . Normocytic anemia 06/10/2016    Past Surgical History:  Procedure Laterality Date  . NO PAST SURGERIES         Family History  Problem Relation Age of Onset  . Diabetes Father   .  Hypertension Father   . Heart disease Cousin   . Heart disease Cousin   . Hypertension Mother   . Hypertension Sister   . Hypertension Paternal Grandmother   . Diabetes Mellitus II Paternal Grandfather     Social History   Tobacco Use  . Smoking status: Current Some Day Smoker    Packs/day: 0.50    Years: 5.00    Pack years: 2.50    Types: Cigarettes  . Smokeless tobacco: Never Used  Substance Use Topics  . Alcohol use: Yes    Comment: socially  . Drug use: Not Currently    Types: Marijuana    Home Medications Prior to Admission medications   Medication Sig Start Date End Date Taking? Authorizing Provider  allopurinol (ZYLOPRIM) 100 MG tablet Take 1 tablet (100 mg total) by mouth daily. 05/10/19   Rosemarie Ax, MD  amLODipine (NORVASC) 10 MG tablet Take 1 tablet (10 mg total) by mouth daily. 06/16/16   Verlee Monte, MD  calcitRIOL (ROCALTROL) 0.25 MCG capsule Take 1 capsule (0.25 mcg total) by mouth daily. 06/16/16   Verlee Monte, MD  calcium carbonate (TUMS - DOSED IN MG ELEMENTAL CALCIUM) 500 MG chewable tablet Chew 1 tablet by mouth 3 (three) times daily with meals.    [provider]  carvedilol (COREG) 25 MG tablet Take 1.5 tablets (37.5 mg total) by mouth 2 (two) times daily with a meal. 10/11/16   Croitoru, [place],  MD  doxazosin (CARDURA) 8 MG tablet Take 8 mg by mouth at bedtime. 11/03/17   [provider]  erythromycin ophthalmic ointment Place a 1/2 inch ribbon of ointment into the lower eyelid of the left eye 07/18/19   Janeece Fitting, PA-C  furosemide (LASIX) 80 MG tablet Take 1 tablet (80 mg total) by mouth 2 (two) times daily. 06/15/16   Verlee Monte, MD  hydrALAZINE (APRESOLINE) 100 MG tablet Take 1 tablet (100 mg total) by mouth every 8 (eight) hours. 06/15/16   Verlee Monte, MD  isosorbide mononitrate (IMDUR) 60 MG 24 hr tablet Take 1 tablet (60 mg total) by mouth daily. 06/16/16   Verlee Monte, MD  methocarbamol (ROBAXIN) 500 MG tablet Take 1 tablet  (500 mg total) by mouth 2 (two) times daily as needed for muscle spasms. 07/04/19   Rosemarie Ax, MD    Allergies    Patient has no known allergies.  Review of Systems   Review of Systems  Constitutional: Negative for fever.  Eyes: Positive for discharge, redness and itching. Negative for photophobia, pain and visual disturbance.    Physical Exam Updated Vital Signs BP (!) 154/100 (BP Location: Right Arm)   Pulse 76   Temp 98 F (36.7 C) (Oral)   Resp 18   Ht 6' (1.829 m)   Wt (!) 137 kg   SpO2 98%   BMI 40.96 kg/m   Physical Exam Vitals and nursing note reviewed.  Constitutional:      Appearance: Normal appearance. He is not ill-appearing or toxic-appearing.  HENT:     Head: Normocephalic and atraumatic.     Nose: Nose normal.     Comments: No sinus tenderness along the frontal or maxillary region.    Mouth/Throat:     Mouth: Mucous membranes are moist.  Eyes:     Extraocular Movements:     Right eye: Normal extraocular motion and no nystagmus.     Left eye: Normal extraocular motion and no nystagmus.     Conjunctiva/sclera:     Right eye: Right conjunctiva is not injected. No chemosis, exudate or hemorrhage.    Left eye: Left conjunctiva is injected. Exudate present. No chemosis or hemorrhage.    Pupils: Pupils are equal, round, and reactive to light.     Comments: No pain with eye movement, mild swelling noted to the left eye with green pus discharge.  Cardiovascular:     Rate and Rhythm: Normal rate.  Pulmonary:     Effort: Pulmonary effort is normal.  Abdominal:     General: Abdomen is flat.  Musculoskeletal:     Cervical back: Normal range of motion and neck supple.  Skin:    General: Skin is warm and dry.  Neurological:     Mental Status: He is alert and oriented to person, place, and time.     Visual Acuity  Right Eye Distance: 20/30 Left Eye Distance: 20/30 Bilateral Distance: 20/25  Right Eye Near: R Near: 20/20 Left Eye Near:  L Near:  20/20 Bilateral Near:  20/20   ED Results / Procedures / Treatments   Labs (all labs ordered are listed, but only abnormal results are displayed) Labs Reviewed - No data to display  EKG None  Radiology No results found.  Procedures Procedures (including critical care time)  Medications Ordered in ED Medications - No data to display  ED Course  I have reviewed the triage vital signs and the nursing notes.  Pertinent labs & imaging results  that were available during my care of the patient were reviewed by me and considered in my medical decision making (see chart for details).    MDM Rules/Calculators/A&P     Patient extensive cardiac history presents to the ED with complaints of left eye drainage for the past month.  Endorses a green purulent discharge from his left eye with some mild swelling.  Valuation of the left eye without any restriction in EOMs, no sinus tenderness along the frontal or maxillary area, no URI symptoms.  Lower suspicion for seasonal allergies.  Has not tried a medication for improvement in his symptoms.  Eye appears injected, green purulent discharge noted.   No pain with eye movement, full range of motion of his left thigh, lower suspicion for orbital cellulitis.  Has not been running any fevers, denies any URI symptoms, lower suspicion for viral etiology.  Has been treated in the past for bacterial conjunctivitis, and has had similar symptoms.  We will provide him with erythromycin ointment to apply to his left lower eyelid.  He is agreeable plan.  Return precautions have been discussed at length.   Portions of this note were generated with [employer]. Dictation errors may occur despite best attempts at proofreading.  Final Clinical Impression(s) / ED Diagnoses Final diagnoses:  Bacterial conjunctivitis    Rx / DC Orders ED Discharge Orders         Ordered    erythromycin ophthalmic ointment     07/18/19 0934           Janeece Fitting, PA-C 07/18/19 2229    Lucrezia Starch, MD 07/19/19 [phone]

## 2019-07-18 NOTE — Discharge Instructions (Addendum)
Please apply erythromycin ointment to your left lower eyelid for the next 5 days.  If you experience any pain with eye movement, fevers, worsening symptoms please return to the emergency department immediately.

## 2019-08-08 ENCOUNTER — Encounter (HOSPITAL_BASED_OUTPATIENT_CLINIC_OR_DEPARTMENT_OTHER): Payer: Self-pay | Admitting: Emergency Medicine

## 2019-08-08 ENCOUNTER — Emergency Department (HOSPITAL_BASED_OUTPATIENT_CLINIC_OR_DEPARTMENT_OTHER)
Admission: EM | Admit: 2019-08-08 | Discharge: 2019-08-08 | Disposition: A | Discharge status: Home / Self Care | Payer: BC Managed Care – PPO | Attending: Emergency Medicine | Admitting: Emergency Medicine

## 2019-08-08 ENCOUNTER — Emergency Department (HOSPITAL_BASED_OUTPATIENT_CLINIC_OR_DEPARTMENT_OTHER): Payer: BC Managed Care – PPO

## 2019-08-08 ENCOUNTER — Other Ambulatory Visit: Payer: Self-pay

## 2019-08-08 DIAGNOSIS — M79671 Pain in right foot: Secondary | ICD-10-CM | POA: Insufficient documentation

## 2019-08-08 DIAGNOSIS — I5040 Unspecified combined systolic (congestive) and diastolic (congestive) heart failure: Secondary | ICD-10-CM | POA: Diagnosis not present

## 2019-08-08 DIAGNOSIS — F17210 Nicotine dependence, cigarettes, uncomplicated: Secondary | ICD-10-CM | POA: Diagnosis not present

## 2019-08-08 DIAGNOSIS — I130 Hypertensive heart and chronic kidney disease with heart failure and stage 1 through stage 4 chronic kidney disease, or unspecified chronic kidney disease: Secondary | ICD-10-CM | POA: Insufficient documentation

## 2019-08-08 DIAGNOSIS — E1122 Type 2 diabetes mellitus with diabetic chronic kidney disease: Secondary | ICD-10-CM | POA: Diagnosis not present

## 2019-08-08 DIAGNOSIS — Z79899 Other long term (current) drug therapy: Secondary | ICD-10-CM | POA: Diagnosis not present

## 2019-08-08 DIAGNOSIS — N184 Chronic kidney disease, stage 4 (severe): Secondary | ICD-10-CM | POA: Insufficient documentation

## 2019-08-08 HISTORY — DX: Obesity, unspecified: E66.9

## 2019-08-08 MED ORDER — ACETAMINOPHEN 325 MG PO TABS
650.0000 mg | ORAL_TABLET | Freq: Once | ORAL | Status: AC
  Administered 2019-08-08: 650 mg via ORAL
  Filled 2019-08-08: qty 2

## 2019-08-08 MED ORDER — HYDROCODONE-ACETAMINOPHEN 5-325 MG PO TABS: 1.0000 | ORAL_TABLET | ORAL | 0 refills | Status: DC | PRN

## 2019-08-08 MED FILL — HYDROCODON-APAP 5-325: 5-325 | 3 days supply | Qty: 15 | Fill #0

## 2019-08-08 NOTE — Discharge Instructions (Signed)
If you develop fever, new or worsening pain, redness, or any other new/concerning symptoms then return to the ER for evaluation.

## 2019-08-08 NOTE — ED Triage Notes (Signed)
Pain to top of right foot with no known injury.  Pt believes it is gout.  There is no swelling or redness.

## 2019-08-08 NOTE — ED Provider Notes (Signed)
[facility] EMERGENCY DEPARTMENT Provider Note   CSN: [id number] Arrival date & time: 08/08/19  6389     History Chief Complaint  Patient presents with  . Foot Pain    Nathan Campbell is a 44 y.o. male.  HPI 45 year old male presents with right foot pain and swelling.  Started 2 or 3 days ago but was not that bad.  Has gotten progressively worse.  Patient has pain throughout his dorsal foot with swelling.  No redness.  No pain in his toes.  Feels like when he has had prior gout in that foot.  Took a muscle relaxer with no relief.   Past Medical History:  Diagnosis Date  . Acute combined systolic and diastolic CHF, NYHA class 4 ([place]) 06/10/2016  . Cardiomyopathy ([place]) 06/14/2016  . CHF (congestive heart failure) ([place])   . Dyspnea   . Hypertension   . Hypertensive heart and kidney disease with heart failure ([place]) 06/14/2016  . Hypertensive heart disease with congestive heart failure ([place])   . Obesity     Patient Active Problem List   Diagnosis Date Noted  . Acute bilateral low back pain without sciatica 07/04/2019  . Acute gout due to renal impairment involving left wrist 04/24/2019  . Malignant hypertension 10/14/2016  . Hypertensive heart disease with CHF (congestive heart failure) ([place]) 06/14/2016  . NICM (nonischemic cardiomyopathy) ([place]) 06/14/2016  . Renal failure   . Hypertensive urgency 06/10/2016  . CKD (chronic kidney disease), stage IV ([place]) 06/10/2016  . Normocytic anemia 06/10/2016    Past Surgical History:  Procedure Laterality Date  . NO PAST SURGERIES         Family History  Problem Relation Age of Onset  . Diabetes Father   . Hypertension Father   . Heart disease Cousin   . Heart disease Cousin   . Hypertension Mother   . Hypertension Sister   . Hypertension Paternal Grandmother   . Diabetes Mellitus II Paternal Grandfather     Social History   Tobacco Use  . Smoking status: Current Some Day Smoker    Packs/day: 0.50    Years:  5.00    Pack years: 2.50    Types: Cigarettes  . Smokeless tobacco: Never Used  Substance Use Topics  . Alcohol use: Yes    Comment: socially  . Drug use: Not Currently    Types: Marijuana    Home Medications Prior to Admission medications   Medication Sig Start Date End Date Taking? Authorizing Provider  allopurinol (ZYLOPRIM) 100 MG tablet Take 1 tablet (100 mg total) by mouth daily. 05/10/19   Rosemarie Ax, MD  amLODipine (NORVASC) 10 MG tablet Take 1 tablet (10 mg total) by mouth daily. 06/16/16   Verlee Monte, MD  calcitRIOL (ROCALTROL) 0.25 MCG capsule Take 1 capsule (0.25 mcg total) by mouth daily. 06/16/16   Verlee Monte, MD  calcium carbonate (TUMS - DOSED IN MG ELEMENTAL CALCIUM) 500 MG chewable tablet Chew 1 tablet by mouth 3 (three) times daily with meals.    [provider]  carvedilol (COREG) 25 MG tablet Take 1.5 tablets (37.5 mg total) by mouth 2 (two) times daily with a meal. 10/11/16   Croitoru, Mihai, MD  doxazosin (CARDURA) 8 MG tablet Take 8 mg by mouth at bedtime. 11/03/17   [provider]  erythromycin ophthalmic ointment Place a 1/2 inch ribbon of ointment into the lower eyelid of the left eye 07/18/19   Janeece Fitting, PA-C  furosemide (LASIX) 80 MG  tablet Take 1 tablet (80 mg total) by mouth 2 (two) times daily. 06/15/16   Verlee Monte, MD  hydrALAZINE (APRESOLINE) 100 MG tablet Take 1 tablet (100 mg total) by mouth every 8 (eight) hours. 06/15/16   Verlee Monte, MD  HYDROcodone-acetaminophen (NORCO) 5-325 MG tablet Take 1 tablet by mouth every 4 (four) hours as needed for severe pain. 08/08/19   Sherwood Gambler, MD  isosorbide mononitrate (IMDUR) 60 MG 24 hr tablet Take 1 tablet (60 mg total) by mouth daily. 06/16/16   Verlee Monte, MD  methocarbamol (ROBAXIN) 500 MG tablet Take 1 tablet (500 mg total) by mouth 2 (two) times daily as needed for muscle spasms. 07/04/19   Rosemarie Ax, MD    Allergies    Patient has no known allergies.  Review of  Systems   Review of Systems  Constitutional: Negative for fever.  Musculoskeletal: Positive for arthralgias and joint swelling.    Physical Exam Updated Vital Signs BP 128/60 (BP Location: Right Arm)   Pulse 70   Temp 98.1 F (36.7 C) (Oral)   Resp 20   Ht 6' (1.829 m)   Wt (!) 137.8 kg   SpO2 97%   BMI 41.19 kg/m   Physical Exam Vitals and nursing note reviewed.  Constitutional:      Appearance: He is well-developed. He is obese.  HENT:     Head: Normocephalic and atraumatic.     Right Ear: External ear normal.     Left Ear: External ear normal.     Nose: Nose normal.  Eyes:     General:        Right eye: No discharge.        Left eye: No discharge.  Cardiovascular:     Rate and Rhythm: Normal rate and regular rhythm.     Pulses:          Dorsalis pedis pulses are 2+ on the right side.  Pulmonary:     Effort: Pulmonary effort is normal.  Musculoskeletal:     Cervical back: Neck supple.     Right ankle: No swelling. No tenderness.     Right foot: Swelling and tenderness present.       Feet:  Skin:    General: Skin is warm and dry.  Neurological:     Mental Status: He is alert.  Psychiatric:        Mood and Affect: Mood is not anxious.     ED Results / Procedures / Treatments   Labs (all labs ordered are listed, but only abnormal results are displayed) Labs Reviewed - No data to display  EKG None  Radiology DG Foot Complete Right  Result Date: 08/08/2019 CLINICAL DATA:  Right foot swelling. History of gout. EXAM: RIGHT FOOT COMPLETE - 3+ VIEW COMPARISON:  None. FINDINGS: There is no evidence of fracture or dislocation. No focal bone erosions identified. Posterior and plantar calcaneal heel spurs noted. Diffuse soft tissue swelling noted. IMPRESSION: 1. No acute bone abnormality. 2. Soft tissue swelling. 3. Heel spurs. Electronically Signed   By: Kerby Moors M.D.   On: 08/08/2019 10:02    Procedures Procedures (including critical care  time)  Medications Ordered in ED Medications  acetaminophen (TYLENOL) tablet 650 mg (650 mg Oral Given 08/08/19 2671)    ED Course  I have reviewed the triage vital signs and the nursing notes.  Pertinent labs & imaging results that were available during my care of the patient were reviewed by me and  considered in my medical decision making (see chart for details).    MDM Rules/Calculators/A&P                      Unclear exact cause of the patient's atraumatic foot swelling and pain.  The gradual worsening and no redness makes it seem unlikely to be gout but he deftly has had gout in the past.  Will give short course of Norco.  I discussed risk/benefits of steroids, given his CHF history.  He declines at this point.  Unfortunately he cannot take NSAIDs given his chronic kidney disease.  Follow-up with PCP. Final Clinical Impression(s) / ED Diagnoses Final diagnoses:  Foot pain, right    Rx / DC Orders ED Discharge Orders         Ordered    HYDROcodone-acetaminophen (NORCO) 5-325 MG tablet  Every 4 hours PRN     08/08/19 1031           Sherwood Gambler, MD 08/08/19 [phone]

## 2019-08-11 ENCOUNTER — Emergency Department (HOSPITAL_BASED_OUTPATIENT_CLINIC_OR_DEPARTMENT_OTHER)
Admission: EM | Admit: 2019-08-11 | Discharge: 2019-08-11 | Disposition: A | Discharge status: Home / Self Care | Payer: BC Managed Care – PPO | Attending: Emergency Medicine | Admitting: Emergency Medicine

## 2019-08-11 ENCOUNTER — Other Ambulatory Visit: Payer: Self-pay

## 2019-08-11 ENCOUNTER — Encounter (HOSPITAL_BASED_OUTPATIENT_CLINIC_OR_DEPARTMENT_OTHER): Payer: Self-pay | Admitting: Emergency Medicine

## 2019-08-11 DIAGNOSIS — I509 Heart failure, unspecified: Secondary | ICD-10-CM | POA: Insufficient documentation

## 2019-08-11 DIAGNOSIS — F17210 Nicotine dependence, cigarettes, uncomplicated: Secondary | ICD-10-CM | POA: Diagnosis not present

## 2019-08-11 DIAGNOSIS — N184 Chronic kidney disease, stage 4 (severe): Secondary | ICD-10-CM | POA: Insufficient documentation

## 2019-08-11 DIAGNOSIS — M79671 Pain in right foot: Secondary | ICD-10-CM | POA: Insufficient documentation

## 2019-08-11 DIAGNOSIS — Z79899 Other long term (current) drug therapy: Secondary | ICD-10-CM | POA: Diagnosis not present

## 2019-08-11 DIAGNOSIS — I130 Hypertensive heart and chronic kidney disease with heart failure and stage 1 through stage 4 chronic kidney disease, or unspecified chronic kidney disease: Secondary | ICD-10-CM | POA: Insufficient documentation

## 2019-08-11 MED ORDER — PREDNISONE 20 MG PO TABS
ORAL_TABLET | ORAL | 0 refills | Status: DC
Start: 2019-08-11 — End: 2019-10-02

## 2019-08-11 MED ORDER — PREDNISONE 50 MG PO TABS
60.0000 mg | ORAL_TABLET | Freq: Once | ORAL | Status: AC
  Administered 2019-08-11: 60 mg via ORAL
  Filled 2019-08-11: qty 1

## 2019-08-11 NOTE — Discharge Instructions (Signed)
Please read and follow all provided instructions.  Your diagnoses today include:  1. Right foot pain     Tests performed today include:  Vital signs. See below for your results today.   Medications prescribed:   Prednisone - steroid medicine   It is best to take this medication in the morning to prevent sleeping problems. If you are diabetic, monitor your blood sugar closely and stop taking Prednisone if blood sugar is over 300. Take with food to prevent stomach upset.   Take any prescribed medications only as directed.  Home care instructions:   Follow any educational materials contained in this packet  Follow R.I.C.E. Protocol:  R - rest your injury   I  - use ice on injury without applying directly to skin  C - compress injury with bandage or splint  E - elevate the injury as much as possible  Follow-up instructions: Please follow-up with your primary care provider or the provided orthopedic physician (bone specialist) if you continue to have significant pain in 1 week. In this case you may have a more severe condition that requires further care.   Return instructions:   Please return if your toes or feet are numb or tingling, appear gray or blue, or you have severe pain (also elevate the leg and loosen splint or wrap if you were given one)  Please return to the Emergency Department if you experience worsening symptoms.   Please return if you have any other emergent concerns.  Additional Information:  Your vital signs today were: BP 123/77 (BP Location: Left Arm)   Pulse 89   Temp 99 F (37.2 C) (Oral)   Resp (!) 22   SpO2 97%  If your blood pressure (BP) was elevated above 135/85 this visit, please have this repeated by your doctor within one month. --------------

## 2019-08-11 NOTE — ED Triage Notes (Signed)
Pt c/o R foot pain x 1 week. He thinks it is gout.

## 2019-08-11 NOTE — ED Provider Notes (Signed)
[facility] EMERGENCY DEPARTMENT Provider Note   CSN: [id number] Arrival date & time: 08/11/19  1212     History Chief Complaint  Patient presents with  . Foot Pain    Nathan Campbell is a 44 y.o. male.  Patient with history of heart failure, chronic kidney disease, gout --presents with continued right foot pain.  Patient was seen in the emergency department on 5/26 and had x-rays which were negative.  At that time he was given hydrocodone/APAP for pain.  He has been taking this without improvement.  Review of records show that patient follows up with sports medicine.  He had a gouty flare in February of this year.  He was given a tapered course of prednisone with good results.  He has been on allopurinol in the past, however does not currently take this.  Patient states that his symptoms feel similar to previous gouty flares.  Pain is mainly in the big toe.  No redness, fever, streaking.  Patient is using crutches to help him get around.        Past Medical History:  Diagnosis Date  . Acute combined systolic and diastolic CHF, NYHA class 4 ([place]) 06/10/2016  . Cardiomyopathy ([place]) 06/14/2016  . CHF (congestive heart failure) ([place])   . Dyspnea   . Hypertension   . Hypertensive heart and kidney disease with heart failure ([place]) 06/14/2016  . Hypertensive heart disease with congestive heart failure ([place])   . Obesity     Patient Active Problem List   Diagnosis Date Noted  . Acute bilateral low back pain without sciatica 07/04/2019  . Acute gout due to renal impairment involving left wrist 04/24/2019  . Malignant hypertension 10/14/2016  . Hypertensive heart disease with CHF (congestive heart failure) ([place]) 06/14/2016  . NICM (nonischemic cardiomyopathy) ([place]) 06/14/2016  . Renal failure   . Hypertensive urgency 06/10/2016  . CKD (chronic kidney disease), stage IV ([place]) 06/10/2016  . Normocytic anemia 06/10/2016    Past Surgical History:  Procedure Laterality Date  .  NO PAST SURGERIES         Family History  Problem Relation Age of Onset  . Diabetes Father   . Hypertension Father   . Heart disease Cousin   . Heart disease Cousin   . Hypertension Mother   . Hypertension Sister   . Hypertension Paternal Grandmother   . Diabetes Mellitus II Paternal Grandfather     Social History   Tobacco Use  . Smoking status: Current Some Day Smoker    Packs/day: 0.50    Years: 5.00    Pack years: 2.50    Types: Cigarettes  . Smokeless tobacco: Never Used  Substance Use Topics  . Alcohol use: Yes    Comment: socially  . Drug use: Not Currently    Types: Marijuana    Home Medications Prior to Admission medications   Medication Sig Start Date End Date Taking? Authorizing Provider  allopurinol (ZYLOPRIM) 100 MG tablet Take 1 tablet (100 mg total) by mouth daily. 05/10/19   Rosemarie Ax, MD  amLODipine (NORVASC) 10 MG tablet Take 1 tablet (10 mg total) by mouth daily. 06/16/16   Verlee Monte, MD  calcitRIOL (ROCALTROL) 0.25 MCG capsule Take 1 capsule (0.25 mcg total) by mouth daily. 06/16/16   Verlee Monte, MD  calcium carbonate (TUMS - DOSED IN MG ELEMENTAL CALCIUM) 500 MG chewable tablet Chew 1 tablet by mouth 3 (three) times daily with meals.    [provider]  carvedilol (  COREG) 25 MG tablet Take 1.5 tablets (37.5 mg total) by mouth 2 (two) times daily with a meal. 10/11/16   Croitoru, Mihai, MD  doxazosin (CARDURA) 8 MG tablet Take 8 mg by mouth at bedtime. 11/03/17   [provider]  erythromycin ophthalmic ointment Place a 1/2 inch ribbon of ointment into the lower eyelid of the left eye 07/18/19   Janeece Fitting, PA-C  furosemide (LASIX) 80 MG tablet Take 1 tablet (80 mg total) by mouth 2 (two) times daily. 06/15/16   Verlee Monte, MD  hydrALAZINE (APRESOLINE) 100 MG tablet Take 1 tablet (100 mg total) by mouth every 8 (eight) hours. 06/15/16   Verlee Monte, MD  HYDROcodone-acetaminophen (NORCO) 5-325 MG tablet Take 1 tablet by mouth  every 4 (four) hours as needed for severe pain. 08/08/19   Sherwood Gambler, MD  isosorbide mononitrate (IMDUR) 60 MG 24 hr tablet Take 1 tablet (60 mg total) by mouth daily. 06/16/16   Verlee Monte, MD  methocarbamol (ROBAXIN) 500 MG tablet Take 1 tablet (500 mg total) by mouth 2 (two) times daily as needed for muscle spasms. 07/04/19   Rosemarie Ax, MD  predniSONE (DELTASONE) 20 MG tablet 3 Tabs PO Days 1-3, then 2 tabs PO Days 4-6, then 1 tab PO Day 7-9, then Half Tab PO Day 10-12 08/11/19   Carlisle Cater, PA-C    Allergies    Patient has no known allergies.  Review of Systems   Review of Systems  Constitutional: Negative for activity change.  Musculoskeletal: Positive for arthralgias and gait problem. Negative for back pain, joint swelling, myalgias and neck pain.  Skin: Negative for wound.  Neurological: Negative for weakness and numbness.    Physical Exam Updated Vital Signs BP 123/77 (BP Location: Left Arm)   Pulse 89   Temp 99 F (37.2 C) (Oral)   Resp (!) 22   SpO2 97%   Physical Exam Vitals and nursing note reviewed.  Constitutional:      Appearance: He is well-developed.  HENT:     Head: Normocephalic and atraumatic.  Eyes:     Conjunctiva/sclera: Conjunctivae normal.  Cardiovascular:     Pulses: Normal pulses. No decreased pulses.  Musculoskeletal:        General: Tenderness present.     Cervical back: Normal range of motion and neck supple.     Right foot: Tenderness present.     Comments: Patient with tenderness at the right MTP joint, worse with movement.  Also tenderness over the top of the foot.  No erythema or significant warmth to suggest cellulitis.  Normal ankle mobility.   Skin:    General: Skin is warm and dry.  Neurological:     Mental Status: He is alert.     Sensory: No sensory deficit.     Comments: Motor, sensation, and vascular distal to the injury is fully intact.      ED Results / Procedures / Treatments   Labs (all labs ordered are  listed, but only abnormal results are displayed) Labs Reviewed - No data to display  EKG None  Radiology No results found.  Procedures Procedures (including critical care time)  Medications Ordered in ED Medications  predniSONE (DELTASONE) tablet 60 mg (60 mg Oral Given 08/11/19 1248)    ED Course  I have reviewed the triage vital signs and the nursing notes.  Pertinent labs & imaging results that were available during my care of the patient were reviewed by me and considered in my  medical decision making (see chart for details).  Patient seen and examined.  Reviewed previous sports medicine visits and recent ED visit.  Vital signs reviewed and are as follows: BP 123/77 (BP Location: Left Arm)   Pulse 89   Temp 99 F (37.2 C) (Oral)   Resp (!) 22   SpO2 97%   Patient was given pain medication 3 days ago however this has not been sufficient to control his symptoms.  Risks and benefits of prednisone discussed.  Patient would like to proceed with prednisone.  First dose ordered.  Patient did well with course prescribed in February.  Encourage sports medicine follow-up as needed.  We discussed signs and symptoms of an infection which would require return and further evaluation.    MDM Rules/Calculators/A&P                      Patient with right foot pain, clinically consistent with gout.  No evidence of cellulitis.  Recent imaging was negative.  Treatment plan as above.  Patient's chronic conditions seem to be compensated.   Final Clinical Impression(s) / ED Diagnoses Final diagnoses:  Right foot pain    Rx / DC Orders ED Discharge Orders         Ordered    predniSONE (DELTASONE) 20 MG tablet     08/11/19 1246           Carlisle Cater, PA-C 08/11/19 1308    Charlesetta Shanks, MD 08/22/19 1626

## 2019-10-02 ENCOUNTER — Telehealth: Payer: Self-pay | Admitting: Family Medicine

## 2019-10-02 MED ORDER — PREDNISONE 10 MG PO TABS
ORAL_TABLET | ORAL | 0 refills | Status: DC
Start: 2019-10-02 — End: 2020-05-11

## 2019-10-02 NOTE — Telephone Encounter (Signed)
-----   Message from Cyd Silence, [place] sent at 10/02/2019 12:04 PM EDT -----  ----- Message ----- From: Carolyne Littles Sent: 10/02/2019  11:40 AM EDT To: Cyd Silence, CMA  Pt is asking for prednisone refill for gout. Pharmacy is in [place].

## 2019-10-02 NOTE — Telephone Encounter (Signed)
6 day prednisone dose pack sent in - please let him know and if he's not improving to follow-up with Dr. Raeford Razor early next week.  Thanks!

## 2019-10-31 ENCOUNTER — Telehealth: Payer: Self-pay | Admitting: Family Medicine

## 2019-10-31 DIAGNOSIS — N184 Chronic kidney disease, stage 4 (severe): Secondary | ICD-10-CM

## 2019-10-31 NOTE — Telephone Encounter (Signed)
Left VM for patient. If he calls back please have him speak with a nurse/CMA and inform him that we need to check his kidney function and other lab work before prescribing another course of prednisone. These labs are placed and he can have them done at his convenience .   If any questions then please take the best time and phone number to call and I will try to call him back.   Rosemarie Ax, MD [facility] 10/31/2019, 2:54 PM

## 2019-10-31 NOTE — Telephone Encounter (Signed)
Patient called requested Rx refill :  predniSONE (DELTASONE) 10 MG tablet [[id number]]   Order Details Dose, Route, Frequency: As Directed  Dispense Quantity: 21 tablet Refills: 0       Sig: 6 tabs po day 1, 5 tabs po day 2, 4 tabs po day 3, 3 tabs po day 4, 2 tabs po day 5, 1 tab po day 6       Pt uses :   [facility], [place].  [address] [place] [address]  Phone:  [phone] Fax:  [phone]   --glh

## 2019-11-13 ENCOUNTER — Emergency Department (HOSPITAL_BASED_OUTPATIENT_CLINIC_OR_DEPARTMENT_OTHER)
Admission: EM | Admit: 2019-11-13 | Discharge: 2019-11-13 | Disposition: A | Discharge status: Home / Self Care | Payer: BC Managed Care – PPO | Attending: Emergency Medicine | Admitting: Emergency Medicine

## 2019-11-13 ENCOUNTER — Other Ambulatory Visit: Payer: Self-pay

## 2019-11-13 ENCOUNTER — Encounter (HOSPITAL_BASED_OUTPATIENT_CLINIC_OR_DEPARTMENT_OTHER): Payer: Self-pay

## 2019-11-13 DIAGNOSIS — Z5321 Procedure and treatment not carried out due to patient leaving prior to being seen by health care provider: Secondary | ICD-10-CM | POA: Insufficient documentation

## 2019-11-13 DIAGNOSIS — M109 Gout, unspecified: Secondary | ICD-10-CM | POA: Insufficient documentation

## 2019-11-13 NOTE — ED Triage Notes (Addendum)
Pt c/o "gout" to left foot and left wrist x 2 days-to triage in w/c-NAD

## 2020-05-11 ENCOUNTER — Emergency Department (INDEPENDENT_AMBULATORY_CARE_PROVIDER_SITE_OTHER): Payer: 59

## 2020-05-11 ENCOUNTER — Encounter: Payer: Self-pay | Admitting: Emergency Medicine

## 2020-05-11 ENCOUNTER — Other Ambulatory Visit: Payer: Self-pay

## 2020-05-11 ENCOUNTER — Emergency Department
Admission: EM | Admit: 2020-05-11 | Discharge: 2020-05-11 | Disposition: A | Discharge status: Home / Self Care | Payer: Self-pay | Source: Home / Self Care | Attending: Family Medicine | Admitting: Family Medicine

## 2020-05-11 DIAGNOSIS — M25562 Pain in left knee: Secondary | ICD-10-CM | POA: Diagnosis not present

## 2020-05-11 DIAGNOSIS — M659 Synovitis and tenosynovitis, unspecified: Secondary | ICD-10-CM | POA: Diagnosis not present

## 2020-05-11 MED ORDER — HYDROCODONE-ACETAMINOPHEN 7.5-325 MG PO TABS: 1.0000 | ORAL_TABLET | Freq: Four times a day (QID) | ORAL | 0 refills | Status: DC | PRN

## 2020-05-11 NOTE — Discharge Instructions (Addendum)
Stay off leg is much as possible Put ice on knee for 20 minutes every couple of hours Take pain medication as needed. Take with food Do not drive on pain medication  If your knee pain or fever get worse please go to the emergency room

## 2020-05-11 NOTE — ED Triage Notes (Signed)
Patient c/o left knee pain x 1 month, no injury to knee, no swelling or redness.  Patient hasn't taken anything for pain or discomfort.

## 2020-05-11 NOTE — ED Provider Notes (Signed)
[name]    CSN: [id number] Arrival date & time: 05/11/20  0941      History   Chief Complaint Chief Complaint  Patient presents with  . Knee Pain    HPI Nathan Campbell is a 45 y.o. male.   HPI   45 year old gentleman with multiple medical problems including heart disease, hypertension, chronic kidney disease, recurring gout. Was seen at urgent care center for bilateral knee pain in January.  Was treated with naproxen 500 mg twice daily for 2 weeks.  Patient has not seen improvement in his left knee and it continues very painful.  Pain with movement.  Pain with ambulation. Patient has also had oral prednisone for his knee.  He states that neither of these are giving him significant improvement in his knee pain.  He has known gout.  He is on allopurinol.  His last measured uric acid is 13.  Upon arrival the patient is found to have a fever of 100.6.  This is unknown to him.  No recent illness.  No respiratory symptoms.  No GI symptoms.  No urinary symptoms.  No known infection.  Repeat temp 99.  Past Medical History:  Diagnosis Date  . Acute combined systolic and diastolic CHF, NYHA class 4 ([place]) 06/10/2016  . Cardiomyopathy ([place]) 06/14/2016  . CHF (congestive heart failure) ([place])   . Dyspnea   . Hypertension   . Hypertensive heart and kidney disease with heart failure ([place]) 06/14/2016  . Hypertensive heart disease with congestive heart failure ([place])   . Obesity     Patient Active Problem List   Diagnosis Date Noted  . Acute bilateral low back pain without sciatica 07/04/2019  . Acute gout due to renal impairment involving left wrist 04/24/2019  . Malignant hypertension 10/14/2016  . Hypertensive heart disease with CHF (congestive heart failure) ([place]) 06/14/2016  . NICM (nonischemic cardiomyopathy) ([place]) 06/14/2016  . Renal failure   . Hypertensive urgency 06/10/2016  . CKD (chronic kidney disease), stage IV ([place]) 06/10/2016  . Normocytic anemia 06/10/2016     Past Surgical History:  Procedure Laterality Date  . NO PAST SURGERIES         Home Medications    Prior to Admission medications   Medication Sig Start Date End Date Taking? Authorizing Provider  allopurinol (ZYLOPRIM) 100 MG tablet Take 1 tablet (100 mg total) by mouth daily. 05/10/19  Yes Rosemarie Ax, MD  amLODipine (NORVASC) 10 MG tablet Take 1 tablet (10 mg total) by mouth daily. 06/16/16  Yes Verlee Monte, MD  calcitRIOL (ROCALTROL) 0.25 MCG capsule Take 1 capsule (0.25 mcg total) by mouth daily. 06/16/16  Yes Verlee Monte, MD  calcium carbonate (TUMS - DOSED IN MG ELEMENTAL CALCIUM) 500 MG chewable tablet Chew 1 tablet by mouth 3 (three) times daily with meals.   Yes [provider]  carvedilol (COREG) 25 MG tablet Take 1.5 tablets (37.5 mg total) by mouth 2 (two) times daily with a meal. 10/11/16  Yes Croitoru, Mihai, MD  doxazosin (CARDURA) 8 MG tablet Take 8 mg by mouth at bedtime. 11/03/17  Yes [provider]  hydrALAZINE (APRESOLINE) 100 MG tablet Take 1 tablet (100 mg total) by mouth every 8 (eight) hours. 06/15/16  Yes Verlee Monte, MD  HYDROcodone-acetaminophen (NORCO) 7.5-325 MG tablet Take 1 tablet by mouth every 6 (six) hours as needed for moderate pain. 05/11/20  Yes Raylene Everts, MD  isosorbide mononitrate (IMDUR) 60 MG 24 hr tablet Take 1 tablet (60 mg  total) by mouth daily. 06/16/16  Yes Verlee Monte, MD  furosemide (LASIX) 80 MG tablet Take 1 tablet (80 mg total) by mouth 2 (two) times daily. 06/15/16 05/11/20  Verlee Monte, MD    Family History Family History  Problem Relation Age of Onset  . Diabetes Father   . Hypertension Father   . Heart disease Cousin   . Heart disease Cousin   . Hypertension Mother   . Hypertension Sister   . Hypertension Paternal Grandmother   . Diabetes Mellitus II Paternal Grandfather     Social History Social History   Tobacco Use  . Smoking status: Current Some Day Smoker    Packs/day: 0.50     Years: 5.00    Pack years: 2.50    Types: Cigarettes  . Smokeless tobacco: Never Used  Vaping Use  . Vaping Use: Former  Substance Use Topics  . Alcohol use: Yes    Comment: socially  . Drug use: Yes    Types: Marijuana     Allergies   Patient has no known allergies.   Review of Systems Review of Systems See HPI  Physical Exam Triage Vital Signs ED Triage Vitals  Enc Vitals Group     BP 05/11/20 0955 130/78     Pulse Rate 05/11/20 0955 89     Resp --      Temp 05/11/20 0955 (!) 100.6 F (38.1 C)     Temp Source 05/11/20 0955 Oral     SpO2 05/11/20 0955 94 %     Weight --      Height --      Head Circumference --      Peak Flow --      Pain Score 05/11/20 0956 9     Pain Loc --      Pain Edu? --      Excl. in [place]? --    No data found.  Updated Vital Signs BP 126/77 (BP Location: Left Arm)   Pulse 88   Temp 99.1 F (37.3 C) (Oral)   SpO2 95%       Physical Exam Constitutional:      General: He is not in acute distress.    Appearance: He is well-developed and well-nourished.     Comments: Appears tired.  Yawns frequently.  Sometimes slow responses.   HENT:     Head: Normocephalic and atraumatic.     Nose:     Comments: Mask is in place    Mouth/Throat:     Mouth: Oropharynx is clear and moist.  Eyes:     Conjunctiva/sclera: Conjunctivae normal.     Pupils: Pupils are equal, round, and reactive to light.  Cardiovascular:     Rate and Rhythm: Normal rate.  Pulmonary:     Effort: Pulmonary effort is normal. No respiratory distress.  Abdominal:     General: There is no distension.     Palpations: Abdomen is soft.  Musculoskeletal:        General: Swelling and tenderness present. No edema. Normal range of motion.     Cervical back: Normal range of motion.     Comments: Left knee has soft tissue swelling..  Warmth.  No erythema overlying skin.  Very tender around joint line.  Pain with any knee movement  Skin:    General: Skin is warm and dry.   Neurological:     Mental Status: He is alert.     Gait: Gait abnormal.     Comments:  Very antalgic gait      UC Treatments / Results  Labs (all labs ordered are listed, but only abnormal results are displayed) Labs Reviewed - No data to display  EKG   Radiology DG Knee Complete 4 Views Left  Result Date: 05/11/2020 CLINICAL DATA:  Left knee pain x1 month EXAM: LEFT KNEE - COMPLETE 4+ VIEW COMPARISON:  None. FINDINGS: No evidence of fracture, dislocation, or joint effusion. No evidence of arthropathy or other focal bone abnormality. Soft tissues are unremarkable. IMPRESSION: Negative. Electronically Signed   By: Dahlia Bailiff MD   On: 05/11/2020 10:46    Procedures Join Aspiration/Injection  Date/Time: 05/11/2020 12:05 PM Performed by: Raylene Everts, MD Authorized by: Raylene Everts, MD   Consent:    Consent obtained:  Verbal   Consent given by:  Patient   Risks, benefits, and alternatives were discussed: yes     Risks discussed:  Infection and pain   Alternatives discussed:  No treatment and alternative treatment Universal protocol:    Procedure explained and questions answered to patient or proxy's satisfaction: yes     Imaging studies available: yes     Site/side marked: yes     Immediately prior to procedure, a time out was called: yes     Patient identity confirmed:  Verbally with patient Location:    Location:  Knee   Knee:  L knee Anesthesia:    Anesthesia method:  Local infiltration   Local anesthetic:  Lidocaine 1% w/o epi Procedure details:    Preparation: Patient was prepped and draped in usual sterile fashion     Needle gauge:  18 G   Approach:  Anterior   Aspirate amount:  Zero   Steroid injected: yes     Specimen collected: no   Post-procedure details:    Dressing:  Adhesive bandage   Procedure completion:  Tolerated well, no immediate complications Comments:     Depo-Medrol 80 mg.: [id number] exp 10/22   Medications Ordered in  UC Medications - No data to display  Initial Impression / Assessment and Plan / UC Course  I have reviewed the triage vital signs and the nursing notes.  Pertinent labs & imaging results that were available during my care of the patient were reviewed by me and considered in my medical decision making (see chart for details).     X-ray showed mild medial joint space narrowing.  No effusion.  Radiology interpretation noted I discussed with the patient that he has had oral steroids.  He has had oral NSAID.  He has a history of stage IV chronic kidney disease and I am not comfortable putting him back on anti-inflammatory medications.  I recommend a steroid injection, with aspiration to check for infection/crystals.  Patient agreed.  Unfortunately the aspiration failed to reveal specimen.  I feel like the soft tissue swelling and pain are due to synovitis more than effusion.  History and physical exam do not support septic joint.  Postinjection care is reviewed with patient Final Clinical Impressions(s) / UC Diagnoses   Final diagnoses:  Acute pain of left knee  Synovitis of left knee     Discharge Instructions     Stay off leg is much as possible Put ice on knee for 20 minutes every couple of hours Take pain medication as needed. Take with food Do not drive on pain medication  If your knee pain or fever get worse please go to the emergency room    ED Prescriptions  Medication Sig Dispense Auth. Provider   HYDROcodone-acetaminophen (NORCO) 7.5-325 MG tablet Take 1 tablet by mouth every 6 (six) hours as needed for moderate pain. 15 tablet Raylene Everts, MD     I have reviewed the PDMP during this encounter.   Raylene Everts, MD 05/11/20 1210

## 2020-05-30 ENCOUNTER — Encounter (HOSPITAL_COMMUNITY): Payer: Self-pay | Admitting: Emergency Medicine

## 2020-05-30 ENCOUNTER — Other Ambulatory Visit: Payer: Self-pay

## 2020-05-30 ENCOUNTER — Ambulatory Visit (HOSPITAL_COMMUNITY)
Admission: EM | Admit: 2020-05-30 | Discharge: 2020-05-30 | Disposition: A | Discharge status: Home / Self Care | Payer: 59 | Attending: Urgent Care | Admitting: Urgent Care

## 2020-05-30 DIAGNOSIS — M25532 Pain in left wrist: Secondary | ICD-10-CM

## 2020-05-30 DIAGNOSIS — N184 Chronic kidney disease, stage 4 (severe): Secondary | ICD-10-CM

## 2020-05-30 DIAGNOSIS — Z8679 Personal history of other diseases of the circulatory system: Secondary | ICD-10-CM

## 2020-05-30 DIAGNOSIS — M109 Gout, unspecified: Secondary | ICD-10-CM | POA: Diagnosis not present

## 2020-05-30 DIAGNOSIS — E790 Hyperuricemia without signs of inflammatory arthritis and tophaceous disease: Secondary | ICD-10-CM

## 2020-05-30 MED ORDER — PREDNISONE 20 MG PO TABS
ORAL_TABLET | ORAL | 0 refills | Status: DC
Start: 2020-05-30 — End: 2020-06-21

## 2020-05-30 NOTE — ED Triage Notes (Signed)
Patient c/o LFT wrist gout flare x 1 day.   Patient states onset of symptoms began with multiple sites, "foot but then that went away and wrist pain started".   Patient endorses  " I can't really move my wrist".   Patient has swelling present to LFT wrist upon assessment.   Patient took " a medicine" last night with no relief of symptoms.

## 2020-05-30 NOTE — ED Provider Notes (Signed)
[facility]   MRN: [id number] DOB: [birth year]  Subjective:   Nathan Campbell is a 45 y.o. male with pmh of gout, CKD stage IV, hypertensive heart disease with CHF, and ICM presenting for 1 day history of acute onset recurrent left wrist pain swelling.  Patient states that he has gout in multiple spots.  Last time his gout level was checked it was high this year.  He is supposed to be on allopurinol and is taking it but not consistently.  Denies chest pain, shortness of breath, lower leg swelling.  Regarding his heart health, reports that he follows up with cardiologist and has been told that his heart function is doing well.  No current facility-administered medications for this encounter.  Current Outpatient Medications:  .  allopurinol (ZYLOPRIM) 100 MG tablet, Take 1 tablet (100 mg total) by mouth daily., Disp: 30 tablet, Rfl: 1 .  amLODipine (NORVASC) 10 MG tablet, Take 1 tablet (10 mg total) by mouth daily., Disp: 30 tablet, Rfl: 1 .  carvedilol (COREG) 25 MG tablet, Take 1.5 tablets (37.5 mg total) by mouth 2 (two) times daily with a meal., Disp: 90 tablet, Rfl: 11 .  hydrALAZINE (APRESOLINE) 100 MG tablet, Take 1 tablet (100 mg total) by mouth every 8 (eight) hours., Disp: 90 tablet, Rfl: 1 .  isosorbide mononitrate (IMDUR) 60 MG 24 hr tablet, Take 1 tablet (60 mg total) by mouth daily., Disp: 30 tablet, Rfl: 1 .  calcitRIOL (ROCALTROL) 0.25 MCG capsule, Take 1 capsule (0.25 mcg total) by mouth daily., Disp: 30 capsule, Rfl: 0 .  calcium carbonate (TUMS - DOSED IN MG ELEMENTAL CALCIUM) 500 MG chewable tablet, Chew 1 tablet by mouth 3 (three) times daily with meals., Disp: , Rfl:  .  doxazosin (CARDURA) 8 MG tablet, Take 8 mg by mouth at bedtime., Disp: , Rfl: 5 .  HYDROcodone-acetaminophen (NORCO) 7.5-325 MG tablet, Take 1 tablet by mouth every 6 (six) hours as needed for moderate pain., Disp: 15 tablet, Rfl: 0   No Known Allergies  Past Medical History:  Diagnosis  Date  . Acute combined systolic and diastolic CHF, NYHA class 4 ([place]) 06/10/2016  . Cardiomyopathy ([place]) 06/14/2016  . CHF (congestive heart failure) ([place])   . Dyspnea   . Hypertension   . Hypertensive heart and kidney disease with heart failure ([place]) 06/14/2016  . Hypertensive heart disease with congestive heart failure ([place])   . Obesity      Past Surgical History:  Procedure Laterality Date  . NO PAST SURGERIES      Family History  Problem Relation Age of Onset  . Diabetes Father   . Hypertension Father   . Heart disease Cousin   . Heart disease Cousin   . Hypertension Mother   . Hypertension Sister   . Hypertension Paternal Grandmother   . Diabetes Mellitus II Paternal Grandfather     Social History   Tobacco Use  . Smoking status: Current Some Day Smoker    Packs/day: 0.50    Years: 5.00    Pack years: 2.50    Types: Cigarettes  . Smokeless tobacco: Never Used  Vaping Use  . Vaping Use: Former  Substance Use Topics  . Alcohol use: Yes    Comment: socially  . Drug use: Yes    Types: Marijuana    ROS   Objective:   Vitals: BP 125/80 (BP Location: Right Arm)   Pulse 87   Temp 98.1 F (36.7 C) (Oral)  Resp 19   SpO2 92%   Pulse oximetry ranged from 94-95% on recheck by PA-Amelie Caracci.   Physical Exam Constitutional:      General: He is not in acute distress.    Appearance: Normal appearance. He is well-developed. He is obese. He is not ill-appearing, toxic-appearing or diaphoretic.  HENT:     Head: Normocephalic and atraumatic.     Right Ear: External ear normal.     Left Ear: External ear normal.     Nose: Nose normal.     Mouth/Throat:     Mouth: Mucous membranes are moist.     Pharynx: Oropharynx is clear.  Eyes:     General: No scleral icterus.    Extraocular Movements: Extraocular movements intact.     Pupils: Pupils are equal, round, and reactive to light.  Cardiovascular:     Rate and Rhythm: Normal rate and regular rhythm.     Heart  sounds: Normal heart sounds. No murmur heard. No friction rub. No gallop.   Pulmonary:     Effort: Pulmonary effort is normal. No respiratory distress.     Breath sounds: Normal breath sounds. No stridor. No wheezing, rhonchi or rales.  Musculoskeletal:     Left wrist: Swelling and tenderness (superficial to light palpation) present. No deformity, effusion, lacerations, bony tenderness, snuff box tenderness or crepitus. Decreased range of motion.  Neurological:     Mental Status: He is alert and oriented to person, place, and time.  Psychiatric:        Mood and Affect: Mood normal.        Behavior: Behavior normal.        Thought Content: Thought content normal.       Assessment and Plan :   I have reviewed the PDMP during this encounter.  1. Acute gout of left wrist, unspecified cause   2. Acute pain of left wrist   3. Elevated uric acid in blood   4. Chronic kidney disease (CKD), stage IV (severe) ([place])   5. History of congestive heart failure     Physical exam findings consistent with gout especially in light of his recent uric acid level.  Will use an oral prednisone course.  Hold allopurinol for now.  Follow-up with PCP ASAP. Counseled patient on potential for adverse effects with medications prescribed/recommended today, ER and return-to-clinic precautions discussed, patient verbalized understanding.    Jaynee Eagles, PA-C 05/30/20 1112

## 2020-05-30 NOTE — Discharge Instructions (Signed)
Hold off on taking allopurinol until your symptoms fully get better.

## 2020-06-18 ENCOUNTER — Ambulatory Visit (HOSPITAL_COMMUNITY)
Admission: EM | Admit: 2020-06-18 | Discharge: 2020-06-18 | Disposition: A | Discharge status: Home / Self Care | Payer: 59 | Attending: Emergency Medicine | Admitting: Emergency Medicine

## 2020-06-18 ENCOUNTER — Other Ambulatory Visit: Payer: Self-pay

## 2020-06-18 ENCOUNTER — Ambulatory Visit (INDEPENDENT_AMBULATORY_CARE_PROVIDER_SITE_OTHER): Payer: 59

## 2020-06-18 ENCOUNTER — Encounter (HOSPITAL_COMMUNITY): Payer: Self-pay

## 2020-06-18 DIAGNOSIS — M109 Gout, unspecified: Secondary | ICD-10-CM | POA: Diagnosis present

## 2020-06-18 DIAGNOSIS — M79671 Pain in right foot: Secondary | ICD-10-CM

## 2020-06-18 DIAGNOSIS — R2241 Localized swelling, mass and lump, right lower limb: Secondary | ICD-10-CM | POA: Diagnosis not present

## 2020-06-18 LAB — COMPREHENSIVE METABOLIC PANEL
ALT: 13 U/L (ref 0–44)
AST: 13 U/L — ABNORMAL LOW (ref 15–41)
Albumin: 3.4 g/dL — ABNORMAL LOW (ref 3.5–5.0)
Alkaline Phosphatase: 78 U/L (ref 38–126)
Anion gap: 9 (ref 5–15)
BUN: 54 mg/dL — ABNORMAL HIGH (ref 6–20)
CO2: 28 mmol/L (ref 22–32)
Calcium: 8.5 mg/dL — ABNORMAL LOW (ref 8.9–10.3)
Chloride: 102 mmol/L (ref 98–111)
Creatinine, Ser: 4.27 mg/dL — ABNORMAL HIGH (ref 0.61–1.24)
GFR, Estimated: 17 mL/min — ABNORMAL LOW (ref >60)
Glucose, Bld: 117 mg/dL — ABNORMAL HIGH (ref 70–99)
Potassium: 3.6 mmol/L (ref 3.5–5.1)
Sodium: 139 mmol/L (ref 135–145)
Total Bilirubin: 0.6 mg/dL (ref 0.3–1.2)
Total Protein: 7.2 g/dL (ref 6.5–8.1)

## 2020-06-18 LAB — CBC WITH DIFFERENTIAL/PLATELET
Abs Immature Granulocytes: 0.04 10*3/uL (ref 0.00–0.07)
Basophils Absolute: 0 10*3/uL (ref 0.0–0.1)
Basophils Relative: 1 %
Eosinophils Absolute: 0.1 10*3/uL (ref 0.0–0.5)
Eosinophils Relative: 1 %
HCT: 33.6 % — ABNORMAL LOW (ref 39.0–52.0)
Hemoglobin: 10.8 g/dL — ABNORMAL LOW (ref 13.0–17.0)
Immature Granulocytes: 1 %
Lymphocytes Relative: 20 %
Lymphs Abs: 1.8 10*3/uL (ref 0.7–4.0)
MCH: 30.3 pg (ref 26.0–34.0)
MCHC: 32.1 g/dL (ref 30.0–36.0)
MCV: 94.1 fL (ref 80.0–100.0)
Monocytes Absolute: 1 10*3/uL (ref 0.1–1.0)
Monocytes Relative: 12 %
Neutro Abs: 5.9 10*3/uL (ref 1.7–7.7)
Neutrophils Relative %: 65 %
Platelets: 226 10*3/uL (ref 150–400)
RBC: 3.57 MIL/uL — ABNORMAL LOW (ref 4.22–5.81)
RDW: 13.6 % (ref 11.5–15.5)
WBC: 8.8 10*3/uL (ref 4.0–10.5)
nRBC: 0 % (ref 0.0–0.2)

## 2020-06-18 MED ORDER — TRAMADOL HCL 50 MG PO TABS: 50.0000 mg | ORAL_TABLET | Freq: Four times a day (QID) | ORAL | 0 refills | Status: DC | PRN

## 2020-06-18 NOTE — Discharge Instructions (Addendum)
Take the Ultram as needed for pain.   Follow up with your Primary Care provider as soon as possible for reevaluation of chronic gout flares.

## 2020-06-18 NOTE — ED Triage Notes (Signed)
Pt here for pain to top of right foot which has been going on for some time but worsening in past 2 days. Pt feels it is due to gout. Pt has been out of his gout medication for several weeks.

## 2020-06-18 NOTE — ED Provider Notes (Signed)
Nathan Campbell    CSN: [id number] Arrival date & time: 06/18/20  1722      History   Chief Complaint Chief Complaint  Patient presents with  . Foot Pain    HPI Nathan Campbell is a 45 y.o. male.   Patient is here for evaluation of right foot pain that began 2 days ago.  Reports pain is worsening.  Reports pain similar to gout flares in past.  Patient was recently evaluated for gout and rest and given steroids.  Patient reports being out of gout medication and steroids at this time.  Denies any fevers at home.  Has not taken any OTC medications or treatments. Denies any specific alleviating or aggravating factors.  Denies any fevers, chest pain, shortness of breath, N/V/D, numbness, tingling, weakness, abdominal pain, or headaches.   ROS: As per HPI, all other pertinent ROS negative   The history is provided by the patient.  Foot Pain    Past Medical History:  Diagnosis Date  . Acute combined systolic and diastolic CHF, NYHA class 4 ([place]) 06/10/2016  . Cardiomyopathy ([place]) 06/14/2016  . CHF (congestive heart failure) ([place])   . Dyspnea   . Hypertension   . Hypertensive heart and kidney disease with heart failure ([place]) 06/14/2016  . Hypertensive heart disease with congestive heart failure ([place])   . Obesity     Patient Active Problem List   Diagnosis Date Noted  . Acute bilateral low back pain without sciatica 07/04/2019  . Acute gout due to renal impairment involving left wrist 04/24/2019  . Malignant hypertension 10/14/2016  . Hypertensive heart disease with CHF (congestive heart failure) ([place]) 06/14/2016  . NICM (nonischemic cardiomyopathy) ([place]) 06/14/2016  . Renal failure   . Hypertensive urgency 06/10/2016  . CKD (chronic kidney disease), stage IV ([place]) 06/10/2016  . Normocytic anemia 06/10/2016    Past Surgical History:  Procedure Laterality Date  . NO PAST SURGERIES         Home Medications    Prior to Admission medications   Medication Sig Start  Date End Date Taking? Authorizing Provider  allopurinol (ZYLOPRIM) 100 MG tablet Take 1 tablet (100 mg total) by mouth daily. 05/10/19  Yes Rosemarie Ax, MD  amLODipine (NORVASC) 10 MG tablet Take 1 tablet (10 mg total) by mouth daily. 06/16/16  Yes Verlee Monte, MD  calcitRIOL (ROCALTROL) 0.25 MCG capsule Take 1 capsule (0.25 mcg total) by mouth daily. 06/16/16  Yes Verlee Monte, MD  calcium carbonate (TUMS - DOSED IN MG ELEMENTAL CALCIUM) 500 MG chewable tablet Chew 1 tablet by mouth 3 (three) times daily with meals.   Yes [provider]  carvedilol (COREG) 25 MG tablet Take 1.5 tablets (37.5 mg total) by mouth 2 (two) times daily with a meal. 10/11/16  Yes Croitoru, Mihai, MD  doxazosin (CARDURA) 8 MG tablet Take 8 mg by mouth at bedtime. 11/03/17  Yes [provider]  hydrALAZINE (APRESOLINE) 100 MG tablet Take 1 tablet (100 mg total) by mouth every 8 (eight) hours. 06/15/16  Yes Verlee Monte, MD  HYDROcodone-acetaminophen (NORCO) 7.5-325 MG tablet Take 1 tablet by mouth every 6 (six) hours as needed for moderate pain. 05/11/20  Yes Raylene Everts, MD  isosorbide mononitrate (IMDUR) 60 MG 24 hr tablet Take 1 tablet (60 mg total) by mouth daily. 06/16/16  Yes Verlee Monte, MD  traMADol (ULTRAM) 50 MG tablet Take 1 tablet (50 mg total) by mouth every 6 (six) hours as needed. 06/18/20  Yes Tamala Julian,  Eulah Pont, NP  predniSONE (DELTASONE) 20 MG tablet Take 2 tablets daily with breakfast. 05/30/20   Jaynee Eagles, PA-C  furosemide (LASIX) 80 MG tablet Take 1 tablet (80 mg total) by mouth 2 (two) times daily. 06/15/16 05/11/20  Verlee Monte, MD    Family History Family History  Problem Relation Age of Onset  . Diabetes Father   . Hypertension Father   . Heart disease Cousin   . Heart disease Cousin   . Hypertension Mother   . Hypertension Sister   . Hypertension Paternal Grandmother   . Diabetes Mellitus II Paternal Grandfather     Social History Social History   Tobacco Use  .  Smoking status: Current Some Day Smoker    Packs/day: 0.00    Years: 5.00    Pack years: 0.00    Types: Cigarettes  . Smokeless tobacco: Never Used  . Tobacco comment: occasionally  Vaping Use  . Vaping Use: Former  Substance Use Topics  . Alcohol use: Yes    Comment: socially  . Drug use: Not Currently    Types: Marijuana     Allergies   Patient has no known allergies.   Review of Systems Review of Systems  Musculoskeletal: Positive for arthralgias and myalgias.  All other systems reviewed and are negative.    Physical Exam Triage Vital Signs ED Triage Vitals  Enc Vitals Group     BP 06/18/20 1741 (!) 152/97     Pulse Rate 06/18/20 1741 96     Resp 06/18/20 1741 18     Temp 06/18/20 1741 (!) 100.5 F (38.1 C)     Temp Source 06/18/20 1741 Oral     SpO2 06/18/20 1741 99 %     Weight --      Height --      Head Circumference --      Peak Flow --      Pain Score 06/18/20 1737 8     Pain Loc --      Pain Edu? --      Excl. in [place]? --    No data found.  Updated Vital Signs BP (!) 152/97 (BP Location: Left Arm)   Pulse 96   Temp (!) 100.5 F (38.1 C) (Oral)   Resp 18   SpO2 99%   Visual Acuity Right Eye Distance:   Left Eye Distance:   Bilateral Distance:    Right Eye Near:   Left Eye Near:    Bilateral Near:     Physical Exam Vitals and nursing note reviewed.  Constitutional:      General: He is not in acute distress.    Appearance: Normal appearance. He is not ill-appearing, toxic-appearing or diaphoretic.  HENT:     Head: Normocephalic and atraumatic.  Eyes:     Conjunctiva/sclera: Conjunctivae normal.  Cardiovascular:     Rate and Rhythm: Normal rate.     Pulses: Normal pulses.  Pulmonary:     Effort: Pulmonary effort is normal.  Abdominal:     General: Abdomen is flat.  Musculoskeletal:     Cervical back: Normal range of motion.     Right foot: Swelling and tenderness present. No deformity or bony tenderness.  Skin:    General:  Skin is warm and dry.  Neurological:     General: No focal deficit present.     Mental Status: He is alert and oriented to person, place, and time.  Psychiatric:        Mood  and Affect: Mood normal.      UC Treatments / Results  Labs (all labs ordered are listed, but only abnormal results are displayed) Labs Reviewed  COMPREHENSIVE METABOLIC PANEL  CBC WITH DIFFERENTIAL/PLATELET    EKG   Radiology DG Foot Complete Right  Result Date: 06/18/2020 CLINICAL DATA:  Right foot pain, swelling EXAM: RIGHT FOOT COMPLETE - 3+ VIEW COMPARISON:  08/08/2019 FINDINGS: Posterior and plantar calcaneal spurs. No acute bony abnormality. Specifically, no fracture, subluxation, or dislocation. Mild degenerative changes at the 1st tarsal metatarsal joint. Remainder of the joint spaces are maintained. IMPRESSION: No acute bony abnormality. Electronically Signed   By: Rolm Baptise M.D.   On: 06/18/2020 18:26    Procedures Procedures (including critical care time)  Medications Ordered in UC Medications - No data to display  Initial Impression / Assessment and Plan / UC Course  I have reviewed the triage vital signs and the nursing notes.  Pertinent labs & imaging results that were available during my care of the patient were reviewed by me and considered in my medical decision making (see chart for details).     Right foot pain.  Differentials include gout, arthritis or other bony abnormality. X-ray shows no acute bony abnormality.  Due to recent steroid prescription and no recent blood work, will treat with Ultram as needed for pain.  CBC and CMP obtained.  Recommended and encourage PCP follow-up for reevaluation and maintenance of chronic gout flares.  Final Clinical Impressions(s) / UC Diagnoses   Final diagnoses:  Foot pain, right  Gout of right foot, unspecified cause, unspecified chronicity     Discharge Instructions     Take the Ultram as needed for pain.   Follow up with your  Primary Care provider as soon as possible for reevaluation of chronic gout flares.       ED Prescriptions    Medication Sig Dispense Auth. Provider   traMADol (ULTRAM) 50 MG tablet Take 1 tablet (50 mg total) by mouth every 6 (six) hours as needed. 15 tablet Pearson Forster, NP     I have reviewed the PDMP during this encounter.   Pearson Forster, NP 06/18/20 [phone]

## 2020-06-18 NOTE — ED Provider Notes (Signed)
[name] controlled substance database reviewed for this patient together with NP Caroll Rancher.   Nathan Campbell, [place] 06/18/20 1848

## 2020-06-21 ENCOUNTER — Ambulatory Visit (HOSPITAL_COMMUNITY)
Admission: EM | Admit: 2020-06-21 | Discharge: 2020-06-21 | Disposition: A | Discharge status: Home / Self Care | Payer: 59 | Attending: Family Medicine | Admitting: Family Medicine

## 2020-06-21 ENCOUNTER — Encounter (HOSPITAL_COMMUNITY): Payer: Self-pay

## 2020-06-21 DIAGNOSIS — M25562 Pain in left knee: Secondary | ICD-10-CM | POA: Insufficient documentation

## 2020-06-21 DIAGNOSIS — R739 Hyperglycemia, unspecified: Secondary | ICD-10-CM | POA: Insufficient documentation

## 2020-06-21 DIAGNOSIS — N184 Chronic kidney disease, stage 4 (severe): Secondary | ICD-10-CM | POA: Diagnosis not present

## 2020-06-21 LAB — BASIC METABOLIC PANEL
Anion gap: 9 (ref 5–15)
BUN: 56 mg/dL — ABNORMAL HIGH (ref 6–20)
CO2: 27 mmol/L (ref 22–32)
Calcium: 8.9 mg/dL (ref 8.9–10.3)
Chloride: 100 mmol/L (ref 98–111)
Creatinine, Ser: 4.59 mg/dL — ABNORMAL HIGH (ref 0.61–1.24)
GFR, Estimated: 15 mL/min — ABNORMAL LOW (ref >60)
Glucose, Bld: 114 mg/dL — ABNORMAL HIGH (ref 70–99)
Potassium: 3.5 mmol/L (ref 3.5–5.1)
Sodium: 136 mmol/L (ref 135–145)

## 2020-06-21 MED ORDER — PREDNISONE 20 MG PO TABS
ORAL_TABLET | ORAL | 0 refills | Status: DC
Start: 2020-06-21 — End: 2020-09-18

## 2020-06-21 NOTE — ED Provider Notes (Signed)
[place]    CSN: [id number] Arrival date & time: 06/21/20  1103      History   Chief Complaint Chief Complaint  Patient presents with  . Knee Pain    HPI Nathan Campbell is a 45 y.o. male.   Patient presenting today with acute on chronic left knee pain and swelling for the past 2 days.  Has been seen multiple times recently for the same issue.  Joint aspiration was attempted 1 month ago without any successful aspiration of fluids to test.  Has been treated with controlled pain medications, prednisone for this recently with temporary relief of symptoms.  Does have a known history of gout and thinks this is what is causing his issues though recent x-ray also showing osteoarthritis changes in the knee.  Currently trying the tramadol given 3 days ago with mild temporary relief but otherwise not trying anything for symptoms.  Has a complicated past medical history including uncontrolled gout, severe chronic kidney disease with a creatinine 3 days ago of 4.27 (per record review his baseline seems to be between 3.0 - 6.0), CHF, essential hypertension, obesity.  Per record review he also has had elevated glucose readings between 110-178 in the past year or so but does not have any diagnosis of IFG or diabetes.  States he has not had a reason to go to his primary care provider recently so has not been in quite some time.  Denies fever, chills, new injury to the knee, erythema, drainage from any area, numbness, tingling, weakness.     Past Medical History:  Diagnosis Date  . Acute combined systolic and diastolic CHF, NYHA class 4 ([place]) 06/10/2016  . Cardiomyopathy ([place]) 06/14/2016  . CHF (congestive heart failure) ([place])   . Dyspnea   . Hypertension   . Hypertensive heart and kidney disease with heart failure ([place]) 06/14/2016  . Hypertensive heart disease with congestive heart failure ([place])   . Obesity     Patient Active Problem List   Diagnosis Date Noted  . Acute bilateral low  back pain without sciatica 07/04/2019  . Acute gout due to renal impairment involving left wrist 04/24/2019  . Malignant hypertension 10/14/2016  . Hypertensive heart disease with CHF (congestive heart failure) ([place]) 06/14/2016  . NICM (nonischemic cardiomyopathy) ([place]) 06/14/2016  . Renal failure   . Hypertensive urgency 06/10/2016  . CKD (chronic kidney disease), stage IV ([place]) 06/10/2016  . Normocytic anemia 06/10/2016    Past Surgical History:  Procedure Laterality Date  . NO PAST SURGERIES         Home Medications    Prior to Admission medications   Medication Sig Start Date End Date Taking? Authorizing Provider  allopurinol (ZYLOPRIM) 100 MG tablet Take 1 tablet (100 mg total) by mouth daily. 05/10/19   Rosemarie Ax, MD  amLODipine (NORVASC) 10 MG tablet Take 1 tablet (10 mg total) by mouth daily. 06/16/16   Verlee Monte, MD  calcitRIOL (ROCALTROL) 0.25 MCG capsule Take 1 capsule (0.25 mcg total) by mouth daily. 06/16/16   Verlee Monte, MD  calcium carbonate (TUMS - DOSED IN MG ELEMENTAL CALCIUM) 500 MG chewable tablet Chew 1 tablet by mouth 3 (three) times daily with meals.    [provider]  carvedilol (COREG) 25 MG tablet Take 1.5 tablets (37.5 mg total) by mouth 2 (two) times daily with a meal. 10/11/16   Croitoru, Mihai, MD  doxazosin (CARDURA) 8 MG tablet Take 8 mg by mouth at bedtime. 11/03/17  [provider]  hydrALAZINE (APRESOLINE) 100 MG tablet Take 1 tablet (100 mg total) by mouth every 8 (eight) hours. 06/15/16   Verlee Monte, MD  HYDROcodone-acetaminophen (NORCO) 7.5-325 MG tablet Take 1 tablet by mouth every 6 (six) hours as needed for moderate pain. 05/11/20   Raylene Everts, MD  isosorbide mononitrate (IMDUR) 60 MG 24 hr tablet Take 1 tablet (60 mg total) by mouth daily. 06/16/16   Verlee Monte, MD  predniSONE (DELTASONE) 20 MG tablet Take 2 tablets daily with breakfast. 06/21/20   Volney American, PA-C  traMADol (ULTRAM) 50 MG tablet  Take 1 tablet (50 mg total) by mouth every 6 (six) hours as needed. 06/18/20   Pearson Forster, NP  furosemide (LASIX) 80 MG tablet Take 1 tablet (80 mg total) by mouth 2 (two) times daily. 06/15/16 05/11/20  Verlee Monte, MD    Family History Family History  Problem Relation Age of Onset  . Diabetes Father   . Hypertension Father   . Heart disease Cousin   . Heart disease Cousin   . Hypertension Mother   . Hypertension Sister   . Hypertension Paternal Grandmother   . Diabetes Mellitus II Paternal Grandfather     Social History Social History   Tobacco Use  . Smoking status: Current Some Day Smoker    Packs/day: 0.00    Years: 5.00    Pack years: 0.00    Types: Cigarettes  . Smokeless tobacco: Never Used  . Tobacco comment: occasionally  Vaping Use  . Vaping Use: Former  Substance Use Topics  . Alcohol use: Yes    Comment: socially  . Drug use: Not Currently    Types: Marijuana     Allergies   Patient has no known allergies.   Review of Systems Review of Systems Per HPI  Physical Exam Triage Vital Signs ED Triage Vitals  Enc Vitals Group     BP 06/21/20 1156 125/69     Pulse Rate 06/21/20 1156 98     Resp 06/21/20 1156 19     Temp 06/21/20 1156 98.7 F (37.1 C)     Temp Source 06/21/20 1156 Oral     SpO2 06/21/20 1156 96 %     Weight --      Height --      Head Circumference --      Peak Flow --      Pain Score 06/21/20 1154 7     Pain Loc --      Pain Edu? --      Excl. in [place]? --    No data found.  Updated Vital Signs BP 125/69 (BP Location: Right Arm)   Pulse 98   Temp 98.7 F (37.1 C) (Oral)   Resp 19   SpO2 96%   Visual Acuity Right Eye Distance:   Left Eye Distance:   Bilateral Distance:    Right Eye Near:   Left Eye Near:    Bilateral Near:     Physical Exam Vitals and nursing note reviewed.  Constitutional:      Appearance: Normal appearance.  HENT:     Head: Atraumatic.     Mouth/Throat:     Mouth: Mucous membranes are  moist.     Pharynx: Oropharynx is clear.  Eyes:     Extraocular Movements: Extraocular movements intact.     Conjunctiva/sclera: Conjunctivae normal.  Cardiovascular:     Rate and Rhythm: Normal rate and regular rhythm.  Pulses: Normal pulses.  Pulmonary:     Effort: Pulmonary effort is normal.     Breath sounds: Normal breath sounds.  Musculoskeletal:        General: Swelling and tenderness present. No deformity or signs of injury. Normal range of motion.     Cervical back: Normal range of motion and neck supple.     Comments: Left knee mildly diffusely edematous, no erythema, warmth, drainage noted  Skin:    General: Skin is warm and dry.     Findings: No bruising, erythema or lesion.  Neurological:     Mental Status: Mental status is at baseline.     Sensory: No sensory deficit.     Motor: No weakness.     Gait: Gait abnormal.     Comments: Antalgic gait  Psychiatric:        Mood and Affect: Mood normal.        Thought Content: Thought content normal.        Judgment: Judgment normal.      UC Treatments / Results  Labs (all labs ordered are listed, but only abnormal results are displayed) Labs Reviewed  BASIC METABOLIC PANEL - Abnormal; Notable for the following components:      Result Value   Glucose, Bld 114 (*)    BUN 56 (*)    Creatinine, Ser 4.59 (*)    GFR, Estimated 15 (*)    All other components within normal limits    EKG   Radiology No results found.  Procedures Procedures (including critical care time)  Medications Ordered in UC Medications - No data to display  Initial Impression / Assessment and Plan / UC Course  I have reviewed the triage vital signs and the nursing notes.  Pertinent labs & imaging results that were available during my care of the patient were reviewed by me and considered in my medical decision making (see chart for details).     Not consistent with gout based on exam today, but given poor kidney function and  history of gout flares will treat with short course of prednisone as this should help whether it is an osteoarthritis flare or a gout flare.  Do need to be cautious about his blood sugars with these multiple rounds of prednisone at this time and discussed this with patient.  Also discussed the severity of his chronic kidney disease and possible ramifications of this, including need for dialysis or death.  Recommended he go to the ED from here for further testing and management of his acute on chronic renal failure but he declines this at this time.  He is agreeable to rechecking these labs though states he does not want to go to the ED either way.  Strongly urged him to follow-up first thing Monday with his primary care to recheck all of these things and provide further recommendations regarding ongoing management.  Again discussed the possible systemic ramifications of him not going to the ED and getting immediate attention for his renal failure.  Final Clinical Impressions(s) / UC Diagnoses   Final diagnoses:  Acute pain of left knee  Stage 4 chronic kidney disease (HCC)  Elevated blood sugar   Discharge Instructions   None    ED Prescriptions    Medication Sig Dispense Auth. Provider   predniSONE (DELTASONE) 20 MG tablet Take 2 tablets daily with breakfast. 6 tablet Volney American, [place]     PDMP not reviewed this encounter.   Volney American, [place] 06/21/20  1614  

## 2020-06-21 NOTE — ED Triage Notes (Signed)
Pt presents with left knee pain x 1 day. Pt states the pain is related to gout.

## 2020-07-21 ENCOUNTER — Encounter: Payer: Self-pay | Admitting: Gastroenterology

## 2020-07-25 ENCOUNTER — Other Ambulatory Visit: Payer: Self-pay

## 2020-07-25 ENCOUNTER — Emergency Department (HOSPITAL_BASED_OUTPATIENT_CLINIC_OR_DEPARTMENT_OTHER)
Admission: EM | Admit: 2020-07-25 | Discharge: 2020-07-25 | Disposition: A | Discharge status: Home / Self Care | Payer: 59 | Attending: Emergency Medicine | Admitting: Emergency Medicine

## 2020-07-25 ENCOUNTER — Other Ambulatory Visit (HOSPITAL_BASED_OUTPATIENT_CLINIC_OR_DEPARTMENT_OTHER): Payer: Self-pay

## 2020-07-25 ENCOUNTER — Encounter (HOSPITAL_BASED_OUTPATIENT_CLINIC_OR_DEPARTMENT_OTHER): Payer: Self-pay | Admitting: Emergency Medicine

## 2020-07-25 DIAGNOSIS — Z79899 Other long term (current) drug therapy: Secondary | ICD-10-CM | POA: Insufficient documentation

## 2020-07-25 DIAGNOSIS — M25571 Pain in right ankle and joints of right foot: Secondary | ICD-10-CM | POA: Insufficient documentation

## 2020-07-25 DIAGNOSIS — F17210 Nicotine dependence, cigarettes, uncomplicated: Secondary | ICD-10-CM | POA: Diagnosis not present

## 2020-07-25 DIAGNOSIS — I130 Hypertensive heart and chronic kidney disease with heart failure and stage 1 through stage 4 chronic kidney disease, or unspecified chronic kidney disease: Secondary | ICD-10-CM | POA: Insufficient documentation

## 2020-07-25 DIAGNOSIS — I5041 Acute combined systolic (congestive) and diastolic (congestive) heart failure: Secondary | ICD-10-CM | POA: Diagnosis not present

## 2020-07-25 DIAGNOSIS — N184 Chronic kidney disease, stage 4 (severe): Secondary | ICD-10-CM | POA: Diagnosis not present

## 2020-07-25 MED ORDER — PREDNISONE 20 MG PO TABS
40.0000 mg | ORAL_TABLET | Freq: Once | ORAL | Status: AC
  Administered 2020-07-25: 40 mg via ORAL
  Filled 2020-07-25: qty 2

## 2020-07-25 MED ORDER — PREDNISONE 10 MG PO TABS
40.0000 mg | ORAL_TABLET | Freq: Every day | ORAL | 0 refills | Status: AC
Start: 2020-07-25 — End: 2020-07-29
  Filled 2020-07-25: qty 15, 3d supply, fill #0

## 2020-07-25 NOTE — ED Provider Notes (Signed)
[facility] Provider Note   CSN: [id number] Arrival date & time: 07/25/20  0859     History Chief Complaint  Patient presents with  . gout flair    Nathan Campbell is a 45 y.o. male.  Patient has chronic gout, feels this is an acute flare.  No fevers no significant redness swelling.  Able to ambulate but has significant discomfort.  No trauma reported.  Very similar to previous flares.  Previous flares treated well with prednisone.  Patient has multiple chronic comorbidities for which he follows primary care.  He is out of his allopurinol.  He is told to call them for refill.  He says he will be able to do so.        Past Medical History:  Diagnosis Date  . Acute combined systolic and diastolic CHF, NYHA class 4 ([place]) 06/10/2016  . Cardiomyopathy ([place]) 06/14/2016  . CHF (congestive heart failure) ([place])   . Dyspnea   . Hypertension   . Hypertensive heart and kidney disease with heart failure ([place]) 06/14/2016  . Hypertensive heart disease with congestive heart failure ([place])   . Obesity     Patient Active Problem List   Diagnosis Date Noted  . Acute bilateral low back pain without sciatica 07/04/2019  . Acute gout due to renal impairment involving left wrist 04/24/2019  . Malignant hypertension 10/14/2016  . Hypertensive heart disease with CHF (congestive heart failure) ([place]) 06/14/2016  . NICM (nonischemic cardiomyopathy) ([place]) 06/14/2016  . Renal failure   . Hypertensive urgency 06/10/2016  . CKD (chronic kidney disease), stage IV ([place]) 06/10/2016  . Normocytic anemia 06/10/2016    Past Surgical History:  Procedure Laterality Date  . NO PAST SURGERIES         Family History  Problem Relation Age of Onset  . Diabetes Father   . Hypertension Father   . Heart disease Cousin   . Heart disease Cousin   . Hypertension Mother   . Hypertension Sister   . Hypertension Paternal Grandmother   . Diabetes Mellitus II Paternal Grandfather      Social History   Tobacco Use  . Smoking status: Current Some Day Smoker    Packs/day: 0.00    Years: 5.00    Pack years: 0.00    Types: Cigarettes  . Smokeless tobacco: Never Used  . Tobacco comment: occasionally  Vaping Use  . Vaping Use: Former  Substance Use Topics  . Alcohol use: Yes    Comment: socially  . Drug use: Not Currently    Types: Marijuana    Home Medications Prior to Admission medications   Medication Sig Start Date End Date Taking? Authorizing Provider  allopurinol (ZYLOPRIM) 100 MG tablet Take 1 tablet (100 mg total) by mouth daily. 05/10/19   Rosemarie Ax, MD  amLODipine (NORVASC) 10 MG tablet Take 1 tablet (10 mg total) by mouth daily. 06/16/16   Verlee Monte, MD  calcitRIOL (ROCALTROL) 0.25 MCG capsule Take 1 capsule (0.25 mcg total) by mouth daily. 06/16/16   Verlee Monte, MD  calcium carbonate (TUMS - DOSED IN MG ELEMENTAL CALCIUM) 500 MG chewable tablet Chew 1 tablet by mouth 3 (three) times daily with meals.    [provider]  carvedilol (COREG) 25 MG tablet Take 1.5 tablets (37.5 mg total) by mouth 2 (two) times daily with a meal. 10/11/16   Croitoru, Mihai, MD  doxazosin (CARDURA) 8 MG tablet Take 8 mg by mouth at bedtime. 11/03/17   [provider]  hydrALAZINE (APRESOLINE) 100 MG tablet Take 1 tablet (100 mg total) by mouth every 8 (eight) hours. 06/15/16   Verlee Monte, MD  HYDROcodone-acetaminophen (NORCO) 7.5-325 MG tablet Take 1 tablet by mouth every 6 (six) hours as needed for moderate pain. 05/11/20   Raylene Everts, MD  isosorbide mononitrate (IMDUR) 60 MG 24 hr tablet Take 1 tablet (60 mg total) by mouth daily. 06/16/16   Verlee Monte, MD  predniSONE (DELTASONE) 20 MG tablet Take 2 tablets daily with breakfast. 06/21/20   Volney American, PA-C  traMADol (ULTRAM) 50 MG tablet Take 1 tablet (50 mg total) by mouth every 6 (six) hours as needed. 06/18/20   Pearson Forster, NP  furosemide (LASIX) 80 MG tablet Take 1 tablet  (80 mg total) by mouth 2 (two) times daily. 06/15/16 05/11/20  Verlee Monte, MD    Allergies    Patient has no known allergies.  Review of Systems   Review of Systems  Constitutional: Negative for chills and fever.  HENT: Negative for congestion and rhinorrhea.   Respiratory: Negative for cough and shortness of breath.   Cardiovascular: Negative for chest pain and palpitations.  Gastrointestinal: Negative for diarrhea, nausea and vomiting.  Genitourinary: Negative for difficulty urinating and dysuria.  Musculoskeletal: Positive for arthralgias. Negative for back pain.  Skin: Negative for color change and rash.  Neurological: Negative for light-headedness and headaches.    Physical Exam Updated Vital Signs BP (!) 179/106 (BP Location: Right Arm)   Pulse 80   Temp 98.3 F (36.8 C) (Oral)   Resp 20   Ht 6' (1.829 m)   Wt (!) 141.8 kg   SpO2 98%   BMI 42.41 kg/m   Physical Exam Vitals and nursing note reviewed.  Constitutional:      General: He is not in acute distress.    Appearance: Normal appearance.  HENT:     Head: Normocephalic and atraumatic.     Nose: No rhinorrhea.  Eyes:     General:        Right eye: No discharge.        Left eye: No discharge.     Conjunctiva/sclera: Conjunctivae normal.  Cardiovascular:     Rate and Rhythm: Normal rate and regular rhythm.  Pulmonary:     Effort: Pulmonary effort is normal.     Breath sounds: No stridor.  Abdominal:     General: Abdomen is flat. There is no distension.     Palpations: Abdomen is soft.  Musculoskeletal:        General: Tenderness present. No deformity or signs of injury.     Comments: Tenderness of the ankle joint, decreased range of motion due to pain but neurovascular intact, no open wounds.  No focal thigh tenderness no bruising  Skin:    General: Skin is warm and dry.  Neurological:     General: No focal deficit present.     Mental Status: He is alert. Mental status is at baseline.     Motor: No  weakness.  Psychiatric:        Mood and Affect: Mood normal.        Behavior: Behavior normal.        Thought Content: Thought content normal.     ED Results / Procedures / Treatments   Labs (all labs ordered are listed, but only abnormal results are displayed) Labs Reviewed - No data to display  EKG None  Radiology No results found.  Procedures  Procedures   Medications Ordered in ED Medications  predniSONE (DELTASONE) tablet 40 mg (has no administration in time range)    ED Course  I have reviewed the triage vital signs and the nursing notes.  Pertinent labs & imaging results that were available during my care of the patient were reviewed by me and considered in my medical decision making (see chart for details).    MDM Rules/Calculators/A&P                          Likely acute on chronic gout secondary to medical noncompliance.  Told to follow-up with primary care.  Given prednisone for gout flare suppression.  Patient will follow up.  No signs of infection no signs of trauma.  Return precautions discussed Final Clinical Impression(s) / ED Diagnoses Final diagnoses:  Right ankle pain, unspecified chronicity    Rx / DC Orders ED Discharge Orders    None       Breck Coons, MD 07/25/20 [phone]

## 2020-07-25 NOTE — Discharge Instructions (Addendum)
Continue to take your prescribed medications for your multiple comorbidities.  The prednisone will be taken once daily today here in the emergency department and then once daily for the next 4 days.  Follow-up right away with your primary doctor to make sure you have all your medications that you have your routine screenings for your chronic diseases

## 2020-07-25 NOTE — ED Triage Notes (Signed)
Reports gout flair to right ankle and foot.  Started Tuesday.  Hx of the same.  Reports drinking cherry juice which has helped some.  Not taking any medications.

## 2020-08-13 ENCOUNTER — Encounter: Payer: Self-pay | Admitting: Gastroenterology

## 2020-08-13 ENCOUNTER — Ambulatory Visit (INDEPENDENT_AMBULATORY_CARE_PROVIDER_SITE_OTHER): Payer: 59 | Admitting: Gastroenterology

## 2020-08-13 VITALS — BP 140/72 | HR 97 | Ht 72.0 in | Wt 313.0 lb

## 2020-08-13 DIAGNOSIS — K5900 Constipation, unspecified: Secondary | ICD-10-CM | POA: Diagnosis not present

## 2020-08-13 DIAGNOSIS — R194 Change in bowel habit: Secondary | ICD-10-CM | POA: Insufficient documentation

## 2020-08-13 MED ORDER — NA SULFATE-K SULFATE-MG SULF 17.5-3.13-1.6 GM/177ML PO SOLN
1.0000 | Freq: Once | ORAL | 0 refills | Status: AC
Start: 2020-08-13 — End: 2020-08-13

## 2020-08-13 NOTE — Progress Notes (Signed)
Attending Physician's Attestation   I have reviewed the chart.   I agree with the Advanced Practitioner's note, impression, and recommendations with any updates as below.    Delman Goshorn Mansouraty, MD [facility] Office # [phone]  

## 2020-08-13 NOTE — Progress Notes (Signed)
08/13/2020 Nathan Campbell [id number] 06/16/1975   HISTORY OF PRESENT ILLNESS: This is a 45 year old male who is new to our office.  He has been referred here by Dr. Hollie Salk, his nephrologist, in order to discuss colonoscopy.  He was referred here to discuss colonoscopy as he is concerned that his cousin was diagnosed with colon cancer.  The referral sheet says that his cousin was diagnosed in his mid 90s, but he tells me that he was diagnosed at age 65.  Patient denies any first-degree relative with colon cancer.  He tells me that he has been having issues with constipation off and on, but seems to be getting worse recently.  He has chronic kidney disease, but is not yet on dialysis.  He has a normocytic anemia with iron studies indicating anemia of chronic disease.  He denies seeing any blood in his stools.  He is not using anything to help him move his bowels.  He has never had colonoscopy in the past   Past Medical History:  Diagnosis Date  . Acute combined systolic and diastolic CHF, NYHA class 4 ([place]) 06/10/2016  . Cardiomyopathy ([place]) 06/14/2016  . CHF (congestive heart failure) ([place])   . Dyspnea   . Hypertension   . Hypertensive heart and kidney disease with heart failure ([place]) 06/14/2016  . Hypertensive heart disease with congestive heart failure ([place])   . Obesity    Past Surgical History:  Procedure Laterality Date  . NO PAST SURGERIES      reports that he has been smoking cigarettes. He has been smoking about 0.00 packs per day for the past 5.00 years. He has never used smokeless tobacco. He reports current alcohol use. He reports previous drug use. Drug: Marijuana. family history includes Diabetes in his father; Diabetes Mellitus II in his paternal grandfather; Heart disease in his cousin and cousin; Hypertension in his father, mother, paternal grandmother, and sister. No Known Allergies    Outpatient Encounter Medications as of 08/13/2020  Medication Sig  . allopurinol  (ZYLOPRIM) 100 MG tablet Take 1 tablet (100 mg total) by mouth daily.  Marland Kitchen amLODipine (NORVASC) 10 MG tablet Take 1 tablet (10 mg total) by mouth daily.  . calcitRIOL (ROCALTROL) 0.25 MCG capsule Take 1 capsule (0.25 mcg total) by mouth daily.  . calcium carbonate (TUMS - DOSED IN MG ELEMENTAL CALCIUM) 500 MG chewable tablet Chew 1 tablet by mouth 3 (three) times daily with meals.  . carvedilol (COREG) 25 MG tablet Take 1.5 tablets (37.5 mg total) by mouth 2 (two) times daily with a meal.  . doxazosin (CARDURA) 8 MG tablet Take 8 mg by mouth at bedtime.  . hydrALAZINE (APRESOLINE) 100 MG tablet Take 1 tablet (100 mg total) by mouth every 8 (eight) hours.  . isosorbide mononitrate (IMDUR) 60 MG 24 hr tablet Take 1 tablet (60 mg total) by mouth daily.  . predniSONE (DELTASONE) 20 MG tablet Take 2 tablets daily with breakfast.  . [DISCONTINUED] furosemide (LASIX) 80 MG tablet Take 1 tablet (80 mg total) by mouth 2 (two) times daily.  . [DISCONTINUED] HYDROcodone-acetaminophen (NORCO) 7.5-325 MG tablet Take 1 tablet by mouth every 6 (six) hours as needed for moderate pain.  . [DISCONTINUED] traMADol (ULTRAM) 50 MG tablet Take 1 tablet (50 mg total) by mouth every 6 (six) hours as needed.   No facility-administered encounter medications on file as of 08/13/2020.     REVIEW OF SYSTEMS  : All other systems reviewed and negative except where noted  in the History of Present Illness.   PHYSICAL EXAM: BP 140/72   Pulse 97   Ht 6' (1.829 m)   Wt (!) 313 lb (142 kg)   BMI 42.45 kg/m  General: Well developed AA male in no acute distress Head: Normocephalic and atraumatic Eyes:  Sclerae anicteric, conjunctiva pink. Ears: Normal auditory acuity Lungs: Clear throughout to auscultation; no W/R/R. Heart: Regular rate and rhythm; no M/R/G. Abdomen: Soft, non-distended.  BS present.  Non-tender. Rectal:  Will be done at the time of colonoscopy. Musculoskeletal: Symmetrical with no gross deformities  Skin:  No lesions on visible extremities Extremities: No edema  Neurological: Alert oriented x 4, grossly nonfocal Psychological:  Alert and cooperative. Normal mood and affect  ASSESSMENT AND PLAN: *Change in bowel habits with constipation: Has been worsening over the past several months.  Has not been using anything to help him move his bowels.  We will plan for colonoscopy to ease his concerns, but he was advised that constipation is most often caused by some motility.  We will schedule colonoscopy with Dr. Rush Landmark.  Also recommended that he begin taking MiraLAX on a daily basis.  The risks, benefits, and alternatives to colonoscopy were discussed with the patient and he consents to proceed.    CC:  Lucianne Lei, MD CC:  Dr. Hollie Salk

## 2020-08-13 NOTE — Patient Instructions (Signed)
Start Miralax 1 capful in 8 ounces of liquid daily.   You have been scheduled for a colonoscopy. Please follow written instructions given to you at your visit today.  Please pick up your prep supplies at the pharmacy within the next 1-3 days. If you use inhalers (even only as needed), please bring them with you on the day of your procedure.  If you are age 43 or older, your body mass index should be between 23-30. Your Body mass index is 42.45 kg/m. If this is out of the aforementioned range listed, please consider follow up with your Primary Care Provider.  If you are age 34 or younger, your body mass index should be between 19-25. Your Body mass index is 42.45 kg/m. If this is out of the aformentioned range listed, please consider follow up with your Primary Care Provider.   __________________________________________________________  The [facility] providers would like to encourage you to use [facility] to communicate with providers for non-urgent requests or questions.  Due to long hold times on the telephone, sending your provider a message by [facility] may be a faster and more efficient way to get a response.  Please allow 48 business hours for a response.  Please remember that this is for non-urgent requests.

## 2020-09-18 ENCOUNTER — Encounter: Payer: Self-pay | Admitting: Gastroenterology

## 2020-09-18 ENCOUNTER — Other Ambulatory Visit: Payer: Self-pay

## 2020-09-18 ENCOUNTER — Ambulatory Visit (AMBULATORY_SURGERY_CENTER): Payer: 59 | Admitting: Gastroenterology

## 2020-09-18 VITALS — BP 176/101 | HR 80 | Temp 98.3°F | Resp 30 | Ht 72.0 in | Wt 313.0 lb

## 2020-09-18 DIAGNOSIS — K641 Second degree hemorrhoids: Secondary | ICD-10-CM

## 2020-09-18 DIAGNOSIS — K5730 Diverticulosis of large intestine without perforation or abscess without bleeding: Secondary | ICD-10-CM

## 2020-09-18 DIAGNOSIS — K5900 Constipation, unspecified: Secondary | ICD-10-CM

## 2020-09-18 DIAGNOSIS — D124 Benign neoplasm of descending colon: Secondary | ICD-10-CM

## 2020-09-18 DIAGNOSIS — R194 Change in bowel habit: Secondary | ICD-10-CM | POA: Diagnosis not present

## 2020-09-18 MED ORDER — SODIUM CHLORIDE 0.9 % IV SOLN: 500.0000 mL | Freq: Once | INTRAVENOUS | Status: DC

## 2020-09-18 NOTE — Op Note (Signed)
[facility] Patient Name: Nathan Campbell Procedure Date: 09/18/2020 3:18 PM MRN: [id number] Endoscopist: Justice Britain , MD Age: 45 Referring MD:  Date of Birth: June 24, 1975 Gender: Male Account #: [id number] Procedure:                Colonoscopy Indications:              Colon cancer screening in patient at possible                            increased risk: Family history of colorectal cancer                            in 2nd degree relative, Incidental change in bowel                            habits noted, Incidental constipation noted Medicines:                Monitored Anesthesia Care Procedure:                Pre-Anesthesia Assessment:                           - Prior to the procedure, a History and Physical                            was performed, and patient medications and                            allergies were reviewed. The patient's tolerance of                            previous anesthesia was also reviewed. The risks                            and benefits of the procedure and the sedation                            options and risks were discussed with the patient.                            All questions were answered, and informed consent                            was obtained. Prior Anticoagulants: The patient has                            taken no previous anticoagulant or antiplatelet                            agents. ASA Grade Assessment: III - A patient with                            severe systemic disease. After reviewing the risks  and benefits, the patient was deemed in                            satisfactory condition to undergo the procedure.                           After obtaining informed consent, the colonoscope                            was passed under direct vision. Throughout the                            procedure, the patient's blood pressure, pulse, and                            oxygen  saturations were monitored continuously. The                            Olympus CF-HQ190L [id number]) Colonoscope was                            introduced through the anus and advanced to the the                            cecum, identified by appendiceal orifice and                            ileocecal valve. The colonoscopy was performed                            without difficulty. The patient tolerated the                            procedure. The quality of the bowel preparation was                            adequate. The ileocecal valve, appendiceal orifice,                            and rectum were photographed. Scope In: 3:21:48 PM Scope Out: 3:37:29 PM Scope Withdrawal Time: 0 hours 10 minutes 27 seconds  Total Procedure Duration: 0 hours 15 minutes 41 seconds  Findings:                 The digital rectal exam findings include                            hemorrhoids. Pertinent negatives include no                            palpable rectal lesions.                           The colon (entire examined portion) was  significantly redundant.                           A 2 mm polyp was found in the descending colon. The                            polyp was sessile. The polyp was removed with a                            cold snare. Resection and retrieval were complete.                           Many small and large-mouthed diverticula were found                            in the entire colon (though larger burden on left                            colon)                           Normal mucosa was found in the entire colon                            otherwise.                           Non-bleeding non-thrombosed internal hemorrhoids                            were found during retroflexion, during perianal                            exam and during digital exam. The hemorrhoids were                            Grade II (internal hemorrhoids that prolapse  but                            reduce spontaneously). Complications:            No immediate complications. Estimated Blood Loss:     Estimated blood loss was minimal. Impression:               - Hemorrhoids found on digital rectal exam.                           - Redundant colon.                           - One 2 mm polyp in the descending colon, removed                            with a cold snare. Resected and retrieved.                           -  Diverticulosis in the entire examined colon                            (Lt>Rt burden).                           - Normal mucosa in the entire examined colon                            otherwise.                           - Non-bleeding non-thrombosed internal hemorrhoids. Recommendation:           - The patient will be observed post-procedure,                            until all discharge criteria are met.                           - Discharge patient to home.                           - Patient has a contact number available for                            emergencies. The signs and symptoms of potential                            delayed complications were discussed with the                            patient. Return to normal activities tomorrow.                            Written discharge instructions were provided to the                            patient.                           - High fiber diet.                           - Use FiberCon 1-2 tablets PO daily.                           - If constipation is still an issue after                            high-fiber diet and fiber supplementation, then                            consider Miralax daily.                           - Continue present medications.                           -  Await pathology results.                           - Repeat colonoscopy in 07/19/08 years for                            surveillance based on pathology results.                           - The  findings and recommendations were discussed                            with the patient. Justice Britain, MD 09/18/2020 3:42:48 PM

## 2020-09-18 NOTE — Patient Instructions (Signed)
Discharge instructions given. Handouts on polyps,diverticulosis and hemorrhoids. Resume previous medications. Use Fiber con 1-2 tablets by mouth daily. YOU HAD AN ENDOSCOPIC PROCEDURE TODAY AT [facility]:   Refer to the procedure report that was given to you for any specific questions about what was found during the examination.  If the procedure report does not answer your questions, please call your gastroenterologist to clarify.  If you requested that your care partner not be given the details of your procedure findings, then the procedure report has been included in a sealed envelope for you to review at your convenience later.  YOU SHOULD EXPECT: Some feelings of bloating in the abdomen. Passage of more gas than usual.  Walking can help get rid of the air that was put into your GI tract during the procedure and reduce the bloating. If you had a lower endoscopy (such as a colonoscopy or flexible sigmoidoscopy) you may notice spotting of blood in your stool or on the toilet paper. If you underwent a bowel prep for your procedure, you may not have a normal bowel movement for a few days.  Please Note:  You might notice some irritation and congestion in your nose or some drainage.  This is from the oxygen used during your procedure.  There is no need for concern and it should clear up in a day or so.  SYMPTOMS TO REPORT IMMEDIATELY:  Following lower endoscopy (colonoscopy or flexible sigmoidoscopy):  Excessive amounts of blood in the stool  Significant tenderness or worsening of abdominal pains  Swelling of the abdomen that is new, acute  Fever of 100F or higher   For urgent or emergent issues, a gastroenterologist can be reached at any hour by calling [phone]. Do not use MyChart messaging for urgent concerns.    DIET:  We do recommend a small meal at first, but then you may proceed to your regular diet.  Drink plenty of fluids but you should avoid alcoholic beverages  for 24 hours.  ACTIVITY:  You should plan to take it easy for the rest of today and you should NOT DRIVE or use heavy machinery until tomorrow (because of the sedation medicines used during the test).    FOLLOW UP: Our staff will call the number listed on your records 48-72 hours following your procedure to check on you and address any questions or concerns that you may have regarding the information given to you following your procedure. If we do not reach you, we will leave a message.  We will attempt to reach you two times.  During this call, we will ask if you have developed any symptoms of COVID 19. If you develop any symptoms (ie: fever, flu-like symptoms, shortness of breath, cough etc.) before then, please call [phone].  If you test positive for Covid 19 in the 2 weeks post procedure, please call and report this information to [place].    If any biopsies were taken you will be contacted by phone or by letter within the next 1-3 weeks.  Please call us at [phone] if you have not heard about the biopsies in 3 weeks.    SIGNATURES/CONFIDENTIALITY: You and/or your care partner have signed paperwork which will be entered into your electronic medical record.  These signatures attest to the fact that that the information above on your After Visit Summary has been reviewed and is understood.  Full responsibility of the confidentiality of this discharge information lies with you and/or your care-partner.

## 2020-09-18 NOTE — Progress Notes (Signed)
VS-CW  Pt's states no medical or surgical changes since previsit or office visit.  

## 2020-09-18 NOTE — Progress Notes (Signed)
Called to room to assist during endoscopic procedure.  Patient ID and intended procedure confirmed with present staff. Received instructions for my participation in the procedure from the performing physician.  

## 2020-09-18 NOTE — Progress Notes (Signed)
pt tolerated well. VSS. awake and to recovery. Report given to RN.  

## 2020-09-22 ENCOUNTER — Telehealth: Payer: Self-pay | Admitting: *Deleted

## 2020-09-22 ENCOUNTER — Telehealth: Payer: Self-pay

## 2020-09-22 NOTE — Telephone Encounter (Signed)
  Follow up Call-  Call back number 09/18/2020  Post procedure Call Back phone  # [phone]  Permission to leave phone message Yes  Some recent data might be hidden    1st follow up call made.  NALM

## 2020-09-22 NOTE — Telephone Encounter (Signed)
No answer for post procedure call back. Left VM. 

## 2020-09-25 ENCOUNTER — Encounter: Payer: Self-pay | Admitting: Gastroenterology

## 2020-10-13 DIAGNOSIS — U071 COVID-19: Secondary | ICD-10-CM

## 2020-10-13 HISTORY — DX: COVID-19: U07.1

## 2020-10-14 ENCOUNTER — Other Ambulatory Visit: Payer: Self-pay

## 2020-10-14 DIAGNOSIS — N184 Chronic kidney disease, stage 4 (severe): Secondary | ICD-10-CM

## 2020-10-17 ENCOUNTER — Ambulatory Visit (INDEPENDENT_AMBULATORY_CARE_PROVIDER_SITE_OTHER): Payer: 59 | Admitting: Vascular Surgery

## 2020-10-17 ENCOUNTER — Encounter: Payer: Self-pay | Admitting: Vascular Surgery

## 2020-10-17 ENCOUNTER — Ambulatory Visit (INDEPENDENT_AMBULATORY_CARE_PROVIDER_SITE_OTHER)
Admission: RE | Admit: 2020-10-17 | Discharge: 2020-10-17 | Disposition: A | Discharge status: Home / Self Care | Payer: 59 | Source: Ambulatory Visit | Attending: Vascular Surgery | Admitting: Vascular Surgery

## 2020-10-17 ENCOUNTER — Other Ambulatory Visit: Payer: Self-pay

## 2020-10-17 ENCOUNTER — Ambulatory Visit (HOSPITAL_COMMUNITY)
Admission: RE | Admit: 2020-10-17 | Discharge: 2020-10-17 | Disposition: A | Discharge status: Home / Self Care | Payer: 59 | Source: Ambulatory Visit | Attending: Vascular Surgery | Admitting: Vascular Surgery

## 2020-10-17 VITALS — BP 148/84 | HR 81 | Temp 98.1°F | Resp 20 | Ht 72.0 in | Wt 321.0 lb

## 2020-10-17 DIAGNOSIS — N184 Chronic kidney disease, stage 4 (severe): Secondary | ICD-10-CM | POA: Insufficient documentation

## 2020-10-17 NOTE — Progress Notes (Signed)
Patient ID: Nathan Campbell, male   DOB: [birth year], 45 y.o.   MRN: [id number]  Reason for Consult: New Patient (Initial Visit)   Referred by Lucianne Lei, MD  Subjective:     HPI:  Nathan Campbell is a 45 y.o. male without previous vascular disease.  He is now being considered for dialysis.  He has never been on dialysis before.  He denies having any family history of dialysis.  He has hypertension as his main risk factor.  He is right-hand dominant.  He denies any chest breast or upper extremity surgery in the past.  He is not on any blood thinners.  Past Medical History:  Diagnosis Date   Acute combined systolic and diastolic CHF, NYHA class 4 ([place]) 06/10/2016   Cardiomyopathy ([place]) 06/14/2016   CHF (congestive heart failure) ([facility])    Dyspnea    Hypertension    Hypertensive heart and kidney disease with heart failure ([place]) 06/14/2016   Hypertensive heart disease with congestive heart failure ([place])    Obesity    Family History  Problem Relation Age of Onset   Hypertension Mother    Diabetes Father    Hypertension Father    Hypertension Sister    Hypertension Paternal Grandmother    Diabetes Mellitus II Paternal Grandfather    Heart disease Cousin    Heart disease Cousin    Colon cancer Cousin    Esophageal cancer Neg Hx    Rectal cancer Neg Hx    Stomach cancer Neg Hx    Past Surgical History:  Procedure Laterality Date   COLONOSCOPY     NO PAST SURGERIES      Short Social History:  Social History   Tobacco Use   Smoking status: Former    Packs/day: 0.00    Years: 5.00    Pack years: 0.00    Types: Cigarettes    Quit date: 09/2020    Years since quitting: 0.0   Smokeless tobacco: Never   Tobacco comments:    occasionally  Substance Use Topics   Alcohol use: Yes    Comment: socially    No Known Allergies  Current Outpatient Medications  Medication Sig Dispense Refill   allopurinol (ZYLOPRIM) 100 MG tablet Take 1 tablet (100 mg total) by mouth daily.  30 tablet 1   amLODipine (NORVASC) 10 MG tablet Take 1 tablet (10 mg total) by mouth daily. 30 tablet 1   calcitRIOL (ROCALTROL) 0.5 MCG capsule Take 0.5 mcg by mouth daily.     calcium carbonate (TUMS - DOSED IN MG ELEMENTAL CALCIUM) 500 MG chewable tablet Chew 1 tablet by mouth 3 (three) times daily with meals.     carvedilol (COREG) 25 MG tablet Take 1.5 tablets (37.5 mg total) by mouth 2 (two) times daily with a meal. 90 tablet 11   doxazosin (CARDURA) 8 MG tablet Take 8 mg by mouth at bedtime.  5   furosemide (LASIX) 80 MG tablet Take 160 mg by mouth 2 (two) times daily.     hydrALAZINE (APRESOLINE) 100 MG tablet Take 1 tablet (100 mg total) by mouth every 8 (eight) hours. 90 tablet 1   isosorbide mononitrate (IMDUR) 60 MG 24 hr tablet Take 1 tablet (60 mg total) by mouth daily. 30 tablet 1   trimethoprim-polymyxin b (POLYTRIM) ophthalmic solution SMARTSIG:In Eye(s)     No current facility-administered medications for this visit.    Review of Systems  Constitutional:  Constitutional negative. HENT: HENT negative.  Eyes: Eyes negative.  Respiratory: Respiratory negative.  Cardiovascular: Cardiovascular negative.  GI: Gastrointestinal negative.  Musculoskeletal: Musculoskeletal negative.  Skin: Skin negative.  Neurological: Neurological negative. Hematologic: Hematologic/lymphatic negative.  Psychiatric: Psychiatric negative.       Objective:  Objective   Vitals:   10/17/20 1040  BP: (!) 148/84  Pulse: 81  Resp: 20  Temp: 98.1 F (36.7 C)  SpO2: 94%  Weight: (!) 321 lb (145.6 kg)  Height: 6' (1.829 m)   Body mass index is 43.54 kg/m.  Physical Exam HENT:     Head: Normocephalic.     Nose:     Comments: Wearing a mask Eyes:     Pupils: Pupils are equal, round, and reactive to light.  Cardiovascular:     Rate and Rhythm: Normal rate.     Pulses:          Radial pulses are 2+ on the right side and 2+ on the left side.  Pulmonary:     Effort: Pulmonary effort  is normal.  Abdominal:     General: Abdomen is flat.     Palpations: Abdomen is soft.  Musculoskeletal:        General: Normal range of motion.  Skin:    Capillary Refill: Capillary refill takes less than 2 seconds.  Neurological:     General: No focal deficit present.     Mental Status: He is alert.  Psychiatric:        Mood and Affect: Mood normal.        Behavior: Behavior normal.        Thought Content: Thought content normal.        Judgment: Judgment normal.    Data: +-----------------+-------------+----------+--------+  Right Cephalic   Diameter (cm)Depth (cm)Findings  +-----------------+-------------+----------+--------+  Shoulder             0.28                         +-----------------+-------------+----------+--------+  Prox upper arm       0.25                         +-----------------+-------------+----------+--------+  Mid upper arm        0.28                         +-----------------+-------------+----------+--------+  Dist upper arm       0.23                         +-----------------+-------------+----------+--------+  Antecubital fossa    0.19                         +-----------------+-------------+----------+--------+  Prox forearm         0.58                         +-----------------+-------------+----------+--------+  Mid forearm          0.41                         +-----------------+-------------+----------+--------+  Dist forearm         0.43                         +-----------------+-------------+----------+--------+   +-----------------+-------------+----------+--------+  Right Basilic  Diameter (cm)Depth (cm)Findings  +-----------------+-------------+----------+--------+  Mid upper arm        0.50                         +-----------------+-------------+----------+--------+  Dist upper arm       0.59                          +-----------------+-------------+----------+--------+  Antecubital fossa    0.60                         +-----------------+-------------+----------+--------+   +-----------------+-------------+----------+--------------+  Left Cephalic    Diameter (cm)Depth (cm)   Findings     +-----------------+-------------+----------+--------------+  Shoulder             0.29                               +-----------------+-------------+----------+--------------+  Prox upper arm       0.24               out of fascia   +-----------------+-------------+----------+--------------+  Mid upper arm                           not visualized  +-----------------+-------------+----------+--------------+  Dist upper arm                          not visualized  +-----------------+-------------+----------+--------------+  Antecubital fossa                       not visualized  +-----------------+-------------+----------+--------------+  Prox forearm         0.60                               +-----------------+-------------+----------+--------------+  Mid forearm          0.50                 branching     +-----------------+-------------+----------+--------------+  Dist forearm         0.47                               +-----------------+-------------+----------+--------------+   +-----------------+-------------+----------+--------+  Left Basilic     Diameter (cm)Depth (cm)Findings  +-----------------+-------------+----------+--------+  Mid upper arm        0.61                         +-----------------+-------------+----------+--------+  Dist upper arm       0.46                         +-----------------+-------------+----------+--------+  Antecubital fossa    0.44                         +-----------------+-------------+----------+--------+      Assessment/Plan:    45 year old male here for initial dialysis access.  He does have  suitable left basilic vein.  We have been asked to wait on graft I do not see a cephalic vein on my exam nor was it seen on duplex today.  He is right-hand  dominant.  I discussed the options for hemodialysis including catheter, fistula, graft.  I have also discussed the preference to begin with his nondominant left upper extremity and we will prepare for first stage basilic vein fistula creation in the near future.  He understands that this will require 2 procedures.  All questions were answered to his satisfaction in the presence of his mother.     Waynetta Sandy MD [facility]

## 2020-10-17 NOTE — H&P (View-Only) (Signed)
Patient ID: Nathan Campbell, male   DOB: 1976/01/18, 45 y.o.   MRN: [id number]  Reason for Consult: New Patient (Initial Visit)   Referred by Lucianne Lei, MD  Subjective:     HPI:  Nathan Campbell is a 45 y.o. male without previous vascular disease.  He is now being considered for dialysis.  He has never been on dialysis before.  He denies having any family history of dialysis.  He has hypertension as his main risk factor.  He is right-hand dominant.  He denies any chest breast or upper extremity surgery in the past.  He is not on any blood thinners.  Past Medical History:  Diagnosis Date   Acute combined systolic and diastolic CHF, NYHA class 4 ([place]) 06/10/2016   Cardiomyopathy ([place]) 06/14/2016   CHF (congestive heart failure) (HCC)    Dyspnea    Hypertension    Hypertensive heart and kidney disease with heart failure ([place]) 06/14/2016   Hypertensive heart disease with congestive heart failure ([place])    Obesity    Family History  Problem Relation Age of Onset   Hypertension Mother    Diabetes Father    Hypertension Father    Hypertension Sister    Hypertension Paternal Grandmother    Diabetes Mellitus II Paternal Grandfather    Heart disease Cousin    Heart disease Cousin    Colon cancer Cousin    Esophageal cancer Neg Hx    Rectal cancer Neg Hx    Stomach cancer Neg Hx    Past Surgical History:  Procedure Laterality Date   COLONOSCOPY     NO PAST SURGERIES      Short Social History:  Social History   Tobacco Use   Smoking status: Former    Packs/day: 0.00    Years: 5.00    Pack years: 0.00    Types: Cigarettes    Quit date: 09/2020    Years since quitting: 0.0   Smokeless tobacco: Never   Tobacco comments:    occasionally  Substance Use Topics   Alcohol use: Yes    Comment: socially    No Known Allergies  Current Outpatient Medications  Medication Sig Dispense Refill   allopurinol (ZYLOPRIM) 100 MG tablet Take 1 tablet (100 mg total) by mouth daily.  30 tablet 1   amLODipine (NORVASC) 10 MG tablet Take 1 tablet (10 mg total) by mouth daily. 30 tablet 1   calcitRIOL (ROCALTROL) 0.5 MCG capsule Take 0.5 mcg by mouth daily.     calcium carbonate (TUMS - DOSED IN MG ELEMENTAL CALCIUM) 500 MG chewable tablet Chew 1 tablet by mouth 3 (three) times daily with meals.     carvedilol (COREG) 25 MG tablet Take 1.5 tablets (37.5 mg total) by mouth 2 (two) times daily with a meal. 90 tablet 11   doxazosin (CARDURA) 8 MG tablet Take 8 mg by mouth at bedtime.  5   furosemide (LASIX) 80 MG tablet Take 160 mg by mouth 2 (two) times daily.     hydrALAZINE (APRESOLINE) 100 MG tablet Take 1 tablet (100 mg total) by mouth every 8 (eight) hours. 90 tablet 1   isosorbide mononitrate (IMDUR) 60 MG 24 hr tablet Take 1 tablet (60 mg total) by mouth daily. 30 tablet 1   trimethoprim-polymyxin b (POLYTRIM) ophthalmic solution SMARTSIG:In Eye(s)     No current facility-administered medications for this visit.    Review of Systems  Constitutional:  Constitutional negative. HENT: HENT negative.  Eyes: Eyes negative.  Respiratory: Respiratory negative.  Cardiovascular: Cardiovascular negative.  GI: Gastrointestinal negative.  Musculoskeletal: Musculoskeletal negative.  Skin: Skin negative.  Neurological: Neurological negative. Hematologic: Hematologic/lymphatic negative.  Psychiatric: Psychiatric negative.       Objective:  Objective   Vitals:   10/17/20 1040  BP: (!) 148/84  Pulse: 81  Resp: 20  Temp: 98.1 F (36.7 C)  SpO2: 94%  Weight: (!) 321 lb (145.6 kg)  Height: 6' (1.829 m)   Body mass index is 43.54 kg/m.  Physical Exam HENT:     Head: Normocephalic.     Nose:     Comments: Wearing a mask Eyes:     Pupils: Pupils are equal, round, and reactive to light.  Cardiovascular:     Rate and Rhythm: Normal rate.     Pulses:          Radial pulses are 2+ on the right side and 2+ on the left side.  Pulmonary:     Effort: Pulmonary effort  is normal.  Abdominal:     General: Abdomen is flat.     Palpations: Abdomen is soft.  Musculoskeletal:        General: Normal range of motion.  Skin:    Capillary Refill: Capillary refill takes less than 2 seconds.  Neurological:     General: No focal deficit present.     Mental Status: He is alert.  Psychiatric:        Mood and Affect: Mood normal.        Behavior: Behavior normal.        Thought Content: Thought content normal.        Judgment: Judgment normal.    Data: +-----------------+-------------+----------+--------+  Right Cephalic   Diameter (cm)Depth (cm)Findings  +-----------------+-------------+----------+--------+  Shoulder             0.28                         +-----------------+-------------+----------+--------+  Prox upper arm       0.25                         +-----------------+-------------+----------+--------+  Mid upper arm        0.28                         +-----------------+-------------+----------+--------+  Dist upper arm       0.23                         +-----------------+-------------+----------+--------+  Antecubital fossa    0.19                         +-----------------+-------------+----------+--------+  Prox forearm         0.58                         +-----------------+-------------+----------+--------+  Mid forearm          0.41                         +-----------------+-------------+----------+--------+  Dist forearm         0.43                         +-----------------+-------------+----------+--------+   +-----------------+-------------+----------+--------+  Right Basilic  Diameter (cm)Depth (cm)Findings  +-----------------+-------------+----------+--------+  Mid upper arm        0.50                         +-----------------+-------------+----------+--------+  Dist upper arm       0.59                          +-----------------+-------------+----------+--------+  Antecubital fossa    0.60                         +-----------------+-------------+----------+--------+   +-----------------+-------------+----------+--------------+  Left Cephalic    Diameter (cm)Depth (cm)   Findings     +-----------------+-------------+----------+--------------+  Shoulder             0.29                               +-----------------+-------------+----------+--------------+  Prox upper arm       0.24               out of fascia   +-----------------+-------------+----------+--------------+  Mid upper arm                           not visualized  +-----------------+-------------+----------+--------------+  Dist upper arm                          not visualized  +-----------------+-------------+----------+--------------+  Antecubital fossa                       not visualized  +-----------------+-------------+----------+--------------+  Prox forearm         0.60                               +-----------------+-------------+----------+--------------+  Mid forearm          0.50                 branching     +-----------------+-------------+----------+--------------+  Dist forearm         0.47                               +-----------------+-------------+----------+--------------+   +-----------------+-------------+----------+--------+  Left Basilic     Diameter (cm)Depth (cm)Findings  +-----------------+-------------+----------+--------+  Mid upper arm        0.61                         +-----------------+-------------+----------+--------+  Dist upper arm       0.46                         +-----------------+-------------+----------+--------+  Antecubital fossa    0.44                         +-----------------+-------------+----------+--------+      Assessment/Plan:    45 year old male here for initial dialysis access.  He does have  suitable left basilic vein.  We have been asked to wait on graft I do not see a cephalic vein on my exam nor was it seen on duplex today.  He is right-hand  dominant.  I discussed the options for hemodialysis including catheter, fistula, graft.  I have also discussed the preference to begin with his nondominant left upper extremity and we will prepare for first stage basilic vein fistula creation in the near future.  He understands that this will require 2 procedures.  All questions were answered to his satisfaction in the presence of his mother.     Waynetta Sandy MD [facility]

## 2020-10-22 ENCOUNTER — Encounter (HOSPITAL_COMMUNITY): Payer: Self-pay | Admitting: *Deleted

## 2020-10-22 ENCOUNTER — Other Ambulatory Visit: Payer: Self-pay

## 2020-10-22 ENCOUNTER — Ambulatory Visit (HOSPITAL_COMMUNITY)
Admission: EM | Admit: 2020-10-22 | Discharge: 2020-10-22 | Disposition: A | Discharge status: Home / Self Care | Payer: 59 | Attending: Emergency Medicine | Admitting: Emergency Medicine

## 2020-10-22 DIAGNOSIS — M1A9XX0 Chronic gout, unspecified, without tophus (tophi): Secondary | ICD-10-CM | POA: Diagnosis not present

## 2020-10-22 DIAGNOSIS — R2242 Localized swelling, mass and lump, left lower limb: Secondary | ICD-10-CM

## 2020-10-22 MED ORDER — PREDNISONE 20 MG PO TABS
30.0000 mg | ORAL_TABLET | Freq: Every day | ORAL | 0 refills | Status: DC
Start: 2020-10-22 — End: 2021-09-13

## 2020-10-22 NOTE — ED Triage Notes (Addendum)
Pt reports gout flare-up onset 3 days ago in left great toe, but now c/o left foot and ankle pain.  Ambulating with crutches. Upon obtaining VS, asked pt if he has any lung problems or has been sick.  Reports a cough x 2 wks.  Denies fevers or SOB.

## 2020-10-22 NOTE — ED Provider Notes (Signed)
[place]    CSN: [id number] Arrival date & time: 10/22/20  1229      History   Chief Complaint Chief Complaint  Patient presents with   Gout    HPI Nathan Campbell is a 45 y.o. male.   Pt has pain to lt foot and big toe with swelling Has a hx of gout and is taking allopurinol for this. Pain getting worse . Has kidney failure and is not able to take nsaids. Not able to walk on lt foot due to pain.    Past Medical History:  Diagnosis Date   Acute combined systolic and diastolic CHF, NYHA class 4 ([place]) 06/10/2016   Cardiomyopathy ([place]) 06/14/2016   CHF (congestive heart failure) ([place])    Dyspnea    Hypertension    Hypertensive heart and kidney disease with heart failure ([place]) 06/14/2016   Hypertensive heart disease with congestive heart failure ([place])    Obesity     Patient Active Problem List   Diagnosis Date Noted   Change in bowel habits 08/13/2020   Constipation 08/13/2020   Acute bilateral low back pain without sciatica 07/04/2019   Acute gout due to renal impairment involving left wrist 04/24/2019   Malignant hypertension 10/14/2016   Hypertensive heart disease with CHF (congestive heart failure) ([place]) 06/14/2016   NICM (nonischemic cardiomyopathy) ([place]) 06/14/2016   Renal failure    Hypertensive urgency 06/10/2016   CKD (chronic kidney disease), stage IV ([employer]) 06/10/2016   Normocytic anemia 06/10/2016    Past Surgical History:  Procedure Laterality Date   COLONOSCOPY     NO PAST SURGERIES         Home Medications    Prior to Admission medications   Medication Sig Start Date End Date Taking? Authorizing Provider  allopurinol (ZYLOPRIM) 100 MG tablet Take 1 tablet (100 mg total) by mouth daily. 05/10/19  Yes Rosemarie Ax, MD  amLODipine (NORVASC) 10 MG tablet Take 1 tablet (10 mg total) by mouth daily. 06/16/16  Yes Verlee Monte, MD  calcitRIOL (ROCALTROL) 0.5 MCG capsule Take 0.5 mcg by mouth daily. 08/26/20  Yes [provider]   carvedilol (COREG) 25 MG tablet Take 1.5 tablets (37.5 mg total) by mouth 2 (two) times daily with a meal. 10/11/16  Yes Croitoru, Mihai, MD  doxazosin (CARDURA) 8 MG tablet Take 8 mg by mouth at bedtime. 11/03/17  Yes [provider]  furosemide (LASIX) 80 MG tablet Take 160 mg by mouth 2 (two) times daily. 08/02/20  Yes [provider]  hydrALAZINE (APRESOLINE) 100 MG tablet Take 1 tablet (100 mg total) by mouth every 8 (eight) hours. 06/15/16  Yes Verlee Monte, MD  isosorbide mononitrate (IMDUR) 60 MG 24 hr tablet Take 1 tablet (60 mg total) by mouth daily. 06/16/16  Yes Verlee Monte, MD  predniSONE (DELTASONE) 20 MG tablet Take 1.5 tablets (30 mg total) by mouth daily with breakfast. 10/22/20  Yes Marney Setting, NP  calcium carbonate (TUMS - DOSED IN MG ELEMENTAL CALCIUM) 500 MG chewable tablet Chew 1 tablet by mouth 3 (three) times daily with meals.    [provider]  trimethoprim-polymyxin b (POLYTRIM) ophthalmic solution SMARTSIG:In Eye(s) 08/07/20   [provider]    Family History Family History  Problem Relation Age of Onset   Hypertension Mother    Diabetes Father    Hypertension Father    Hypertension Sister    Hypertension Paternal Grandmother    Diabetes Mellitus II Paternal [employer]chant [employer] officer  Heart disease Cousin    Heart disease Cousin    Colon cancer Cousin    Esophageal cancer Neg Hx    Rectal cancer Neg Hx    Stomach cancer Neg Hx     Social History Social History   Tobacco Use   Smoking status: Former    Packs/day: 0.00    Years: 5.00    Pack years: 0.00    Types: Cigarettes    Quit date: 09/2020    Years since quitting: 0.1   Smokeless tobacco: Never   Tobacco comments:    occasionally  Vaping Use   Vaping Use: Former  Substance Use Topics   Alcohol use: Yes    Comment: socially   Drug use: Not Currently    Types: Marijuana     Allergies   Patient has no known allergies.   Review of Systems Review of  Systems  Constitutional:  Positive for fatigue.       Chronic fatigue   Respiratory:  Positive for cough.   Cardiovascular:  Positive for leg swelling. Negative for palpitations.  Gastrointestinal: Negative.   Genitourinary:  Positive for difficulty urinating.       Chronic due to kideny dx   Musculoskeletal:        Swelling, pain to lt foot and big toe.   Neurological: Negative.     Physical Exam Triage Vital Signs ED Triage Vitals  Enc Vitals Group     BP 10/22/20 1355 123/80     Pulse Rate 10/22/20 1355 86     Resp --      Temp 10/22/20 1355 99.8 F (37.7 C)     Temp Source 10/22/20 1355 Oral     SpO2 10/22/20 1355 93 %     Weight --      Height --      Head Circumference --      Peak Flow --      Pain Score 10/22/20 1359 4     Pain Loc --      Pain Edu? --      Excl. in [place]? --    No data found.  Updated Vital Signs BP 123/80   Pulse 86   Temp 99.8 F (37.7 C) (Oral)   SpO2 93%   Visual Acuity Right Eye Distance:   Left Eye Distance:   Bilateral Distance:    Right Eye Near:   Left Eye Near:    Bilateral Near:     Physical Exam Constitutional:      Appearance: He is obese.  Cardiovascular:     Rate and Rhythm: Tachycardia present.  Pulmonary:     Breath sounds: Normal breath sounds.  Abdominal:     General: There is distension.     Palpations: Abdomen is soft.  Musculoskeletal:        General: Swelling and tenderness present.     Comments: +2 pitting edema to lt foot and big toe, tenderness to palpation entire foot, warm to touch.   Skin:    Capillary Refill: Capillary refill takes less than 2 seconds.     Findings: Erythema present.  Neurological:     Mental Status: He is alert.     UC Treatments / Results  Labs (all labs ordered are listed, but only abnormal results are displayed) Labs Reviewed - No data to display  EKG   Radiology No results found.  Procedures Procedures (including critical care time)  Medications Ordered in  UC Medications - No data  to display  Initial Impression / Assessment and Plan / UC Course  I have reviewed the triage vital signs and the nursing notes.  Pertinent labs & imaging results that were available during my care of the patient were reviewed by me and considered in my medical decision making (see chart for details).     Spoke to md lamptey and referred to upto date with kidney failure hx only able to give short dose of steroids.  Pt needs to call pcp and be seen with updated labs  Go to the er if pain continues  Keep foot elevated  Stay hydrated  Reviewed previous chart  Final Clinical Impressions(s) / UC Diagnoses   Final diagnoses:  Chronic gout involving toe of left foot without tophus, unspecified cause  Localized swelling of left foot     Discharge Instructions      Pt needs to call pcp and be seen with updated labs  Go to the er if pain continues  Keep foot elevated  Stay hydrated      ED Prescriptions     Medication Sig Dispense Auth. Provider   predniSONE (DELTASONE) 20 MG tablet Take 1.5 tablets (30 mg total) by mouth daily with breakfast. 5 tablet Marney Setting, NP      PDMP not reviewed this encounter.   Marney Setting, NP 10/22/20 1455

## 2020-10-22 NOTE — Discharge Instructions (Addendum)
Pt needs to call pcp and be seen with updated labs  Go to the er if pain continues  Keep foot elevated  Stay hydrated

## 2020-10-29 ENCOUNTER — Ambulatory Visit (HOSPITAL_COMMUNITY): Payer: 59 | Admitting: Vascular Surgery

## 2020-10-29 ENCOUNTER — Other Ambulatory Visit: Payer: Self-pay

## 2020-10-29 ENCOUNTER — Encounter (HOSPITAL_COMMUNITY): Payer: Self-pay | Admitting: Vascular Surgery

## 2020-10-29 NOTE — Progress Notes (Addendum)
Anesthesia Chart Review: SAME DAY WORK-UP  Case: [id number] Date/Time: 10/30/20 1056   Procedure: LEFT ARM ARTERIOVENOUS (AV) FISTULA CREATION (Left)   Anesthesia type: Choice   Pre-op diagnosis: CKD IV   Location: [facility] OR ROOM 12 / [place] [facility]   Surgeons: Waynetta Sandy, MD       DISCUSSION: Patient is a 45 year old male scheduled for the above procedure. He is nearing need for hemodialysis.  History includes former smoker (quit 09/12/20), HTN, combined systolic and diastolic CHF (EF 40% 0/8676, normalized 11/2016), cardiomyopathy, dyspnea, CKD, obesity.   First evaluated by cardiologist Dr. Sallyanne Kuster in 05/2016 during admission for HTN urgency and acute combined CHF with EF 35% with acute on chronic renal failure/concern for nephrotic syndrome. CM felt likely non-ischemic given no chest pain, no ECG changes. Medical therapy recommended, and may consider ischemic evaluation if his EF did not improve. His EF improved to 55-60% by 12/01/16, and remained normal on 04/12/19 echo. Last visit with Kerin Ransom, PA-C on 11/11/16. He remains on Coreg, Lasix, hydralazine, amlodipine, Imdur.    He was prescribed a 5 day course of prednisone 30 mg on 10/22/20 for gout flare.   He is a same-day work-up.  Labs and anesthesia team evaluation on the day of surgery.  EKG as indicated on arrival (last available 08/08/19 and > 1 year ago).  UPDATE 10/29/20 4:42 PM: I was recently notified that patient reported some cough and congestion. [facility] was notified. Surgery was not cancelled, instead they asked that he let them know if any worsening symptoms overnight, and if none, then arrive 3 hours early for COVID-19 testing.    VS:  BP Readings from Last 3 Encounters:  10/22/20 123/80  10/17/20 (!) 148/84  09/18/20 (!) 176/101   Pulse Readings from Last 3 Encounters:  10/22/20 86  10/17/20 81  09/18/20 80     PROVIDERS: Lucianne Lei, MD is PCP  Madelon Lips, MD is nephrologist. He is being referred to [facility] for renal transplant evaluation.  Croitoru, Dani Gobble, MD is cardiologist, last visit 2018.   LABS: For day of surgery. As of 06/21/20, Cr 4.59, glucose 114. H/H 10.8/33.6 and PLT 226 on 06/18/20.    EKG: Last EKG notes is > 66 year old. EKG 08/08/19: NSR   CV: Echo 04/12/19: IMPRESSIONS   1. Left ventricular ejection fraction, by visual estimation, is 60 to  65%. The left ventricle has normal function. There is moderately increased  left ventricular hypertrophy.   2. Elevated left ventricular end-diastolic pressure.   3. Left ventricular diastolic parameters are consistent with Grade I  diastolic dysfunction (impaired relaxation).   4. The left ventricle has no regional wall motion abnormalities.   5. Global right ventricle has normal systolic function.The right  ventricular size is mildly enlarged. No increase in right ventricular wall  thickness.   6. Left atrial size was moderately dilated.   7. Right atrial size was moderately dilated.   8. The mitral valve is normal in structure. No evidence of mitral valve  regurgitation. No evidence of mitral stenosis.   9. The tricuspid valve is normal in structure.  10. The tricuspid valve is normal in structure. Tricuspid valve  regurgitation is not demonstrated.  11. The aortic valve is normal in structure. Aortic valve regurgitation is  not visualized. No evidence of aortic valve sclerosis or stenosis.  12. The pulmonic valve was normal in structure. Pulmonic valve  regurgitation is not visualized.  13. The inferior vena  cava is normal in size with greater than 50%  respiratory variability, suggesting right atrial pressure of 3 mmHg.  14. No significant change from prior study.  15. Prior images reviewed side by side.  (Comparison: LVEF 35%, diffuse hypokinesis, grade 2 DD 06/11/16; LVEF 55-60% 12/01/16)   Past Medical History:  Diagnosis Date   Acute combined systolic and diastolic CHF, NYHA class 4 ([place]) 06/10/2016    Cardiomyopathy ([place]) 06/14/2016   CHF (congestive heart failure) ([facility])    CKD (chronic kidney disease)    Dyspnea    Hypertension    Hypertensive heart and kidney disease with heart failure ([place]) 06/14/2016   Hypertensive heart disease with congestive heart failure ([place])    Obesity     Past Surgical History:  Procedure Laterality Date   COLONOSCOPY     NO PAST SURGERIES      MEDICATIONS: No current facility-administered medications for this encounter.    allopurinol (ZYLOPRIM) 100 MG tablet   amLODipine (NORVASC) 10 MG tablet   calcitRIOL (ROCALTROL) 0.5 MCG capsule   calcium carbonate (TUMS - DOSED IN MG ELEMENTAL CALCIUM) 500 MG chewable tablet   carvedilol (COREG) 25 MG tablet   doxazosin (CARDURA) 8 MG tablet   furosemide (LASIX) 80 MG tablet   hydrALAZINE (APRESOLINE) 100 MG tablet   isosorbide mononitrate (IMDUR) 60 MG 24 hr tablet   predniSONE (DELTASONE) 20 MG tablet   torsemide (DEMADEX) 20 MG tablet   trimethoprim-polymyxin b (POLYTRIM) ophthalmic solution    Myra Gianotti, PA-C Surgical Short Stay/Anesthesiology [facility] Phone [phone] [facility] Phone [phone] 10/29/2020 12:53 PM

## 2020-10-29 NOTE — Anesthesia Preprocedure Evaluation (Addendum)
Anesthesia Evaluation    Airway        Dental   Pulmonary shortness of breath, former smoker,           Cardiovascular hypertension, Pt. on medications and Pt. on home beta blockers +CHF    Echo 04/12/19: IMPRESSIONS  1. Left ventricular ejection fraction, by visual estimation, is 60 to  65%. The left ventricle has normal function. There is moderately increased  left ventricular hypertrophy.  2. Elevated left ventricular end-diastolic pressure.  3. Left ventricular diastolic parameters are consistent with Grade I  diastolic dysfunction (impaired relaxation).  4. The left ventricle has no regional wall motion abnormalities.  5. Global right ventricle has normal systolic function.The right  ventricular size is mildly enlarged. No increase in right ventricular wall  thickness.  6. Left atrial size was moderately dilated.  7. Right atrial size was moderately dilated.  8. The mitral valve is normal in structure. No evidence of mitral valve  regurgitation. No evidence of mitral stenosis.  9. The tricuspid valve is normal in structure.  10. The tricuspid valve is normal in structure. Tricuspid valve  regurgitation is not demonstrated.  11. The aortic valve is normal in structure. Aortic valve regurgitation is  not visualized. No evidence of aortic valve sclerosis or stenosis.  12. The pulmonic valve was normal in structure. Pulmonic valve  regurgitation is not visualized.  13. The inferior vena cava is normal in size with greater than 50%  respiratory variability, suggesting right atrial pressure of 3 mmHg.  14. No significant change from prior study.  15. Prior images reviewed side by side.     Neuro/Psych negative neurological ROS  negative psych ROS   GI/Hepatic negative GI ROS, Neg liver ROS,   Endo/Other  Morbid obesity  Renal/GU CRFRenal disease  negative genitourinary   Musculoskeletal  (+) Arthritis ,    Abdominal   Peds  Hematology  (+) anemia ,   Anesthesia Other Findings 10/29/20 4:42 PM: I was recently notified that patient reported some cough and congestion. [facility] was notified. Surgery was not cancelled, instead they asked that he let them know if any worsening symptoms overnight, and if none, then arrive 3 hours early for COVID-19 testing  Reproductive/Obstetrics                            Anesthesia Physical Anesthesia Plan  ASA: 3  Anesthesia Plan: MAC and Regional   Post-op Pain Management:    Induction: Intravenous  PONV Risk Score and Plan: 1 and Ondansetron, Dexamethasone, Midazolam, Propofol infusion and Treatment may vary due to age or medical condition  Airway Management Planned: Simple Face Mask, Natural Airway and Nasal Cannula  Additional Equipment: None  Intra-op Plan:   Post-operative Plan:   Informed Consent:   Plan Discussed with:   Anesthesia Plan Comments: (See PAT note written 10/29/2020 by Myra Gianotti, PA-C. He needs a preoperative COVID test.   Echo 04/12/19: IMPRESSIONS  1. Left ventricular ejection fraction, by visual estimation, is 60 to  65%. The left ventricle has normal function. There is moderately increased  left ventricular hypertrophy.  2. Elevated left ventricular end-diastolic pressure.  3. Left ventricular diastolic parameters are consistent with Grade I  diastolic dysfunction (impaired relaxation).  4. The left ventricle has no regional wall motion abnormalities.  5. Global right ventricle has normal systolic function.The right  ventricular size is mildly enlarged. No increase in right ventricular wall  thickness.  6. Left atrial size was moderately dilated.  7. Right atrial size was moderately dilated.  8. The mitral valve is normal in structure. No evidence of mitral valve  regurgitation. No evidence of mitral stenosis.  9. The tricuspid valve is normal in structure.  10. The tricuspid  valve is normal in structure. Tricuspid valve  regurgitation is not demonstrated.  11. The aortic valve is normal in structure. Aortic valve regurgitation is  not visualized. No evidence of aortic valve sclerosis or stenosis.  12. The pulmonic valve was normal in structure. Pulmonic valve  regurgitation is not visualized.  13. The inferior vena cava is normal in size with greater than 50%  respiratory variability, suggesting right atrial pressure of 3 mmHg.  14. No significant change from prior study.  15. Prior images reviewed side by side.  (Comparison: LVEF 35%, diffuse hypokinesis, grade 2 DD 06/11/16; LVEF 55-60% 12/01/16) Lab Results      Component                Value               Date                      WBC                      8.8                 06/18/2020                HGB                      9.9 (L)             10/30/2020                HCT                      29.0 (L)            10/30/2020                MCV                      94.1                06/18/2020                PLT                      226                 06/18/2020           Lab Results      Component                Value               Date                      NA                       139                 10/30/2020                K  4.5                 10/30/2020                CO2                      27                  06/21/2020                GLUCOSE                  122 (H)             10/30/2020                BUN                      107 (H)             10/30/2020                CREATININE               7.00 (H)            10/30/2020                CALCIUM                  8.9                 06/21/2020                GFRNONAA                 15 (L)              06/21/2020                GFRAA                    32 (L)              05/13/2017          )      Anesthesia Quick Evaluation

## 2020-10-29 NOTE — Progress Notes (Signed)
PCP - Dr. Criss Rosales Cardiologist - Dr. Sallyanne Kuster EKG - DOS Chest x-ray -  ECHO - 04/12/19 Cardiac Cath -    COVID TEST- DOS   During SDW call pt reported new cough, congestion, runny nose. Minette Brine from [facility] notified, anesthesia aware. Per [facility] to have pt arrive 3hrs prior to be covid tested DOS d/t new symptoms, advised pt (per [facility]) to call [facility] office if symptoms worsen over night to cx surgery.   Anesthesia review: YES  -------------  SDW INSTRUCTIONS:  Your procedure is scheduled on Thursday 8/18. Please report to [facility] Main Entrance "A" at [place].M., and check in at the Admitting office. Call this number if you have problems the morning of surgery: [phone]   Remember: Do not eat or drink after midnight the night before your surgery   Medications to take morning of surgery with a sip of water include: allopurinol (ZYLOPRIM) amLODipine (NORVASC) carvedilol (COREG) hydrALAZINE (APRESOLINE) isosorbide mononitrate (IMDUR) predniSONE (DELTASONE)   As of today, STOP taking any Aspirin (unless otherwise instructed by your surgeon), Aleve, Naproxen, Ibuprofen, Motrin, Advil, Goody's, BC's, all herbal medications, fish oil, and all vitamins.    The Morning of Surgery Do not wear jewelry Do not wear lotions, powders, colognes, or deodorant Men may shave face and neck. Do not bring valuables to the hospital. [facility] is not responsible for any belongings or valuables.  If you are a smoker, DO NOT Smoke 24 hours prior to surgery  If you wear a CPAP at night please bring your mask the morning of surgery   Remember that you must have someone to transport you home after your surgery, and remain with you for 24 hours if you are discharged the same day.  Please bring cases for contacts, glasses, hearing aids, dentures or bridgework because it cannot be worn into surgery.   Patients discharged the day of surgery will not be allowed to drive home.   Please shower the  NIGHT BEFORE/MORNING OF SURGERY (use antibacterial soap like DIAL soap if possible). Wear comfortable clothes the morning of surgery. Oral Hygiene is also important to reduce your risk of infection.  Remember - BRUSH YOUR TEETH THE MORNING OF SURGERY WITH YOUR REGULAR TOOTHPASTE  Patient denies shortness of breath, fever, cough and chest pain.

## 2020-10-30 ENCOUNTER — Ambulatory Visit (HOSPITAL_COMMUNITY)
Admission: RE | Admit: 2020-10-30 | Discharge: 2020-10-30 | Disposition: A | Discharge status: Home / Self Care | Payer: 59 | Attending: Vascular Surgery | Admitting: Vascular Surgery

## 2020-10-30 ENCOUNTER — Encounter (HOSPITAL_COMMUNITY)
Admission: RE | Disposition: A | Discharge status: Home / Self Care | Payer: Self-pay | Source: Home / Self Care | Attending: Vascular Surgery

## 2020-10-30 ENCOUNTER — Encounter (HOSPITAL_COMMUNITY): Payer: Self-pay | Admitting: Vascular Surgery

## 2020-10-30 DIAGNOSIS — U071 COVID-19: Secondary | ICD-10-CM | POA: Diagnosis not present

## 2020-10-30 DIAGNOSIS — I130 Hypertensive heart and chronic kidney disease with heart failure and stage 1 through stage 4 chronic kidney disease, or unspecified chronic kidney disease: Secondary | ICD-10-CM | POA: Diagnosis not present

## 2020-10-30 DIAGNOSIS — Z87891 Personal history of nicotine dependence: Secondary | ICD-10-CM | POA: Insufficient documentation

## 2020-10-30 DIAGNOSIS — Z79899 Other long term (current) drug therapy: Secondary | ICD-10-CM | POA: Insufficient documentation

## 2020-10-30 DIAGNOSIS — Z538 Procedure and treatment not carried out for other reasons: Secondary | ICD-10-CM | POA: Diagnosis not present

## 2020-10-30 DIAGNOSIS — I5042 Chronic combined systolic (congestive) and diastolic (congestive) heart failure: Secondary | ICD-10-CM | POA: Insufficient documentation

## 2020-10-30 DIAGNOSIS — N184 Chronic kidney disease, stage 4 (severe): Secondary | ICD-10-CM | POA: Insufficient documentation

## 2020-10-30 HISTORY — DX: Chronic kidney disease, unspecified: N18.9

## 2020-10-30 LAB — POCT I-STAT, CHEM 8
BUN: 107 mg/dL — ABNORMAL HIGH (ref 6–20)
Calcium, Ion: 0.99 mmol/L — ABNORMAL LOW (ref 1.15–1.40)
Chloride: 103 mmol/L (ref 98–111)
Creatinine, Ser: 7 mg/dL — ABNORMAL HIGH (ref 0.61–1.24)
Glucose, Bld: 122 mg/dL — ABNORMAL HIGH (ref 70–99)
HCT: 29 % — ABNORMAL LOW (ref 39.0–52.0)
Hemoglobin: 9.9 g/dL — ABNORMAL LOW (ref 13.0–17.0)
Potassium: 4.5 mmol/L (ref 3.5–5.1)
Sodium: 139 mmol/L (ref 135–145)
TCO2: 28 mmol/L (ref 22–32)

## 2020-10-30 LAB — GLUCOSE, CAPILLARY: Glucose-Capillary: 125 mg/dL — ABNORMAL HIGH (ref 70–99)

## 2020-10-30 LAB — SARS CORONAVIRUS 2 BY RT PCR (HOSPITAL ORDER, PERFORMED IN ~~LOC~~ HOSPITAL LAB): SARS Coronavirus 2: POSITIVE — AB

## 2020-10-30 SURGERY — ARTERIOVENOUS (AV) FISTULA CREATION
Anesthesia: Choice | Laterality: Left

## 2020-10-30 MED ORDER — CHLORHEXIDINE GLUCONATE 4 % EX LIQD: 60.0000 mL | Freq: Once | CUTANEOUS | Status: DC

## 2020-10-30 MED ORDER — CEFAZOLIN SODIUM-DEXTROSE 2-4 GM/100ML-% IV SOLN
2.0000 g | INTRAVENOUS | Status: DC
  Filled 2020-10-30: qty 100

## 2020-10-30 MED ORDER — PROPOFOL 10 MG/ML IV BOLUS
INTRAVENOUS | Status: AC
  Filled 2020-10-30: qty 20

## 2020-10-30 MED ORDER — SODIUM CHLORIDE 0.9 % IV SOLN: INTRAVENOUS | Status: DC

## 2020-10-30 MED ORDER — CHLORHEXIDINE GLUCONATE 0.12 % MT SOLN
OROMUCOSAL | Status: AC
  Administered 2020-10-30: 15 mL
  Filled 2020-10-30: qty 15

## 2020-10-30 MED ORDER — MIDAZOLAM HCL 2 MG/2ML IJ SOLN
INTRAMUSCULAR | Status: AC
  Filled 2020-10-30: qty 2

## 2020-10-30 MED ORDER — FENTANYL CITRATE (PF) 250 MCG/5ML IJ SOLN
INTRAMUSCULAR | Status: AC
  Filled 2020-10-30: qty 5

## 2020-10-30 NOTE — Interval H&P Note (Signed)
History and Physical Interval Note:  10/30/2020 8:20 AM  Nathan Campbell  has presented today for surgery, with the diagnosis of CKD IV.  The various methods of treatment have been discussed with the patient and family. After consideration of risks, benefits and other options for treatment, the patient has consented to  Procedure(s): LEFT ARM ARTERIOVENOUS (AV) FISTULA CREATION (Left) as a surgical intervention.  The patient's history has been reviewed, patient examined, no change in status, stable for surgery.  I have reviewed the patient's chart and labs.  Questions were answered to the patient's satisfaction.     [name]

## 2020-10-30 NOTE — Progress Notes (Signed)
Pt was tested in pre-op d/t covid related symptoms x1 week. Pt still visibly sick. Test resulted as positive. Per Dr. Donzetta Matters, case will be cancelled today and rescheduled at a later date. OR desk and anesthesia aware. Pt verbalized understanding of today's cancellation and told to go home and quarantine. IV removed and pt's mother contacted. Pt discharged home with mother. All concerns addressed at time of discharge.  Jacqlyn Larsen, RN

## 2020-10-31 ENCOUNTER — Other Ambulatory Visit: Payer: Self-pay

## 2020-11-07 ENCOUNTER — Encounter (HOSPITAL_COMMUNITY): Payer: Self-pay | Admitting: Vascular Surgery

## 2020-11-07 ENCOUNTER — Other Ambulatory Visit: Payer: Self-pay

## 2020-11-07 NOTE — Progress Notes (Signed)
Spoke with pt for pre-op call. Pt has hx of HTN, CHF and kidney failure. Pt recently had Covid, states he's feeling better. Pt states he's not diabetic or pre-diabetic.

## 2020-11-10 NOTE — Anesthesia Preprocedure Evaluation (Addendum)
Anesthesia Evaluation  Patient identified by MRN, date of birth, ID band Patient awake    Reviewed: Allergy & Precautions, NPO status , Patient's Chart, lab work & pertinent test results  Airway Mallampati: II  TM Distance: >3 FB Neck ROM: Full    Dental no notable dental hx. (+) Teeth Intact, Dental Advisory Given   Pulmonary former smoker,    Pulmonary exam normal breath sounds clear to auscultation       Cardiovascular hypertension, Pt. on medications and Pt. on home beta blockers +CHF  Normal cardiovascular exam Rhythm:Regular Rate:Normal    Echo 04/12/19: IMPRESSIONS  1. Left ventricular ejection fraction, by visual estimation, is 60 to  65%. The left ventricle has normal function. There is moderately increased  left ventricular hypertrophy.  2. Elevated left ventricular end-diastolic pressure.  3. Left ventricular diastolic parameters are consistent with Grade I  diastolic dysfunction (impaired relaxation).  4. The left ventricle has no regional wall motion abnormalities.  5. Global right ventricle has normal systolic function.The right  ventricular size is mildly enlarged. No increase in right ventricular wall  thickness.  6. Left atrial size was moderately dilated.  7. Right atrial size was moderately dilated.  8. The mitral valve is normal in structure. No evidence of mitral valve  regurgitation. No evidence of mitral stenosis.  9. The tricuspid valve is normal in structure.  10. The tricuspid valve is normal in structure. Tricuspid valve  regurgitation is not demonstrated.  11. The aortic valve is normal in structure. Aortic valve regurgitation is  not visualized. No evidence of aortic valve sclerosis or stenosis.  12. The pulmonic valve was normal in structure. Pulmonic valve  regurgitation is not visualized.  13. The inferior vena cava is normal in size with greater than 50%  respiratory variability,  suggesting right atrial pressure of 3 mmHg.  14. No significant change from prior study.  15. Prior images reviewed side by side.    Neuro/Psych    GI/Hepatic   Endo/Other  Morbid obesity  Renal/GU ESRFRenal disease     Musculoskeletal  (+) Arthritis ,   Abdominal (+) + obese (BMI 43.3),   Peds  Hematology  (+) anemia , Lab Results      Component                Value               Date                      WBC                      8.8                 06/18/2020                HGB                      9.9 (L)             10/30/2020                HCT                      29.0 (L)            10/30/2020                MCV  94.1                06/18/2020                PLT                      226                 06/18/2020              Anesthesia Other Findings   Reproductive/Obstetrics                            Anesthesia Physical Anesthesia Plan  ASA: 3  Anesthesia Plan: Regional   Post-op Pain Management:    Induction:   PONV Risk Score and Plan: 2 and Treatment may vary due to age or medical condition, Midazolam, Ondansetron and Dexamethasone  Airway Management Planned: Natural Airway and Nasal Cannula  Additional Equipment: None  Intra-op Plan:   Post-operative Plan:   Informed Consent: I have reviewed the patients History and Physical, chart, labs and discussed the procedure including the risks, benefits and alternatives for the proposed anesthesia with the patient or authorized representative who has indicated his/her understanding and acceptance.     Dental advisory given  Plan Discussed with: CRNA  Anesthesia Plan Comments: (L supraclavicular block)       Anesthesia Quick Evaluation

## 2020-11-10 NOTE — Progress Notes (Signed)
Left a voice mail to inform the pt of the surgery time change to 7:30am-8:42am, arrival at 5:30am.

## 2020-11-11 ENCOUNTER — Encounter (HOSPITAL_COMMUNITY)
Admission: RE | Disposition: A | Discharge status: Home / Self Care | Payer: Self-pay | Source: Home / Self Care | Attending: Vascular Surgery

## 2020-11-11 ENCOUNTER — Ambulatory Visit (HOSPITAL_COMMUNITY): Payer: 59 | Admitting: Anesthesiology

## 2020-11-11 ENCOUNTER — Ambulatory Visit (HOSPITAL_COMMUNITY)
Admission: RE | Admit: 2020-11-11 | Discharge: 2020-11-11 | Disposition: A | Discharge status: Home / Self Care | Payer: 59 | Attending: Vascular Surgery | Admitting: Vascular Surgery

## 2020-11-11 DIAGNOSIS — I5040 Unspecified combined systolic (congestive) and diastolic (congestive) heart failure: Secondary | ICD-10-CM | POA: Diagnosis not present

## 2020-11-11 DIAGNOSIS — I132 Hypertensive heart and chronic kidney disease with heart failure and with stage 5 chronic kidney disease, or end stage renal disease: Secondary | ICD-10-CM | POA: Insufficient documentation

## 2020-11-11 DIAGNOSIS — N185 Chronic kidney disease, stage 5: Secondary | ICD-10-CM | POA: Diagnosis not present

## 2020-11-11 DIAGNOSIS — Z8249 Family history of ischemic heart disease and other diseases of the circulatory system: Secondary | ICD-10-CM | POA: Insufficient documentation

## 2020-11-11 DIAGNOSIS — N186 End stage renal disease: Secondary | ICD-10-CM

## 2020-11-11 DIAGNOSIS — Z87891 Personal history of nicotine dependence: Secondary | ICD-10-CM | POA: Insufficient documentation

## 2020-11-11 DIAGNOSIS — Z992 Dependence on renal dialysis: Secondary | ICD-10-CM

## 2020-11-11 HISTORY — PX: AV FISTULA PLACEMENT: SHX1204

## 2020-11-11 LAB — POCT I-STAT, CHEM 8
BUN: 108 mg/dL — ABNORMAL HIGH (ref 6–20)
Calcium, Ion: 1.08 mmol/L — ABNORMAL LOW (ref 1.15–1.40)
Chloride: 104 mmol/L (ref 98–111)
Creatinine, Ser: 7.3 mg/dL — ABNORMAL HIGH (ref 0.61–1.24)
Glucose, Bld: 116 mg/dL — ABNORMAL HIGH (ref 70–99)
HCT: 28 % — ABNORMAL LOW (ref 39.0–52.0)
Hemoglobin: 9.5 g/dL — ABNORMAL LOW (ref 13.0–17.0)
Potassium: 3.9 mmol/L (ref 3.5–5.1)
Sodium: 140 mmol/L (ref 135–145)
TCO2: 23 mmol/L (ref 22–32)

## 2020-11-11 SURGERY — ARTERIOVENOUS (AV) FISTULA CREATION
Anesthesia: Regional | Laterality: Left

## 2020-11-11 MED ORDER — LIDOCAINE-EPINEPHRINE (PF) 1 %-1:200000 IJ SOLN
INTRAMUSCULAR | Status: DC | PRN
  Administered 2020-11-11: 10 mL

## 2020-11-11 MED ORDER — CEFAZOLIN IN SODIUM CHLORIDE 3-0.9 GM/100ML-% IV SOLN
3.0000 g | INTRAVENOUS | Status: AC
  Administered 2020-11-11: 3 g via INTRAVENOUS
  Filled 2020-11-11: qty 100

## 2020-11-11 MED ORDER — 0.9 % SODIUM CHLORIDE (POUR BTL) OPTIME
TOPICAL | Status: DC | PRN
  Administered 2020-11-11: 1000 mL

## 2020-11-11 MED ORDER — LIDOCAINE-EPINEPHRINE (PF) 1 %-1:200000 IJ SOLN
INTRAMUSCULAR | Status: AC
  Filled 2020-11-11: qty 30

## 2020-11-11 MED ORDER — CHLORHEXIDINE GLUCONATE 4 % EX LIQD: 60.0000 mL | Freq: Once | CUTANEOUS | Status: DC

## 2020-11-11 MED ORDER — FENTANYL CITRATE (PF) 250 MCG/5ML IJ SOLN
INTRAMUSCULAR | Status: AC
  Filled 2020-11-11: qty 5

## 2020-11-11 MED ORDER — ONDANSETRON HCL 4 MG/2ML IJ SOLN
4.0000 mg | Freq: Once | INTRAMUSCULAR | Status: DC | PRN
Start: 2020-11-11 — End: 2020-11-11

## 2020-11-11 MED ORDER — OXYCODONE HCL 5 MG PO TABS: 5.0000 mg | ORAL_TABLET | Freq: Once | ORAL | Status: DC | PRN

## 2020-11-11 MED ORDER — CLONIDINE HCL (ANALGESIA) 100 MCG/ML EP SOLN
EPIDURAL | Status: DC | PRN
  Administered 2020-11-11: 100 ug

## 2020-11-11 MED ORDER — CHLORHEXIDINE GLUCONATE 0.12 % MT SOLN
15.0000 mL | Freq: Once | OROMUCOSAL | Status: AC
  Administered 2020-11-11: 15 mL via OROMUCOSAL
  Filled 2020-11-11: qty 15

## 2020-11-11 MED ORDER — HEPARIN 6000 UNIT IRRIGATION SOLUTION
Status: DC | PRN
  Administered 2020-11-11: 1

## 2020-11-11 MED ORDER — SODIUM CHLORIDE 0.9 % IV SOLN: INTRAVENOUS | Status: DC

## 2020-11-11 MED ORDER — ONDANSETRON HCL 4 MG/2ML IJ SOLN
INTRAMUSCULAR | Status: DC | PRN
  Administered 2020-11-11: 4 mg via INTRAVENOUS

## 2020-11-11 MED ORDER — ROPIVACAINE HCL 5 MG/ML IJ SOLN
INTRAMUSCULAR | Status: DC | PRN
  Administered 2020-11-11: 150 mg via PERINEURAL

## 2020-11-11 MED ORDER — HYDROCODONE-ACETAMINOPHEN 5-325 MG PO TABS: 1.0000 | ORAL_TABLET | Freq: Four times a day (QID) | ORAL | 0 refills | Status: DC | PRN

## 2020-11-11 MED ORDER — AMISULPRIDE (ANTIEMETIC) 5 MG/2ML IV SOLN
10.0000 mg | Freq: Once | INTRAVENOUS | Status: DC | PRN
Start: 2020-11-11 — End: 2020-11-11

## 2020-11-11 MED ORDER — FENTANYL CITRATE (PF) 100 MCG/2ML IJ SOLN
INTRAMUSCULAR | Status: DC | PRN
  Administered 2020-11-11: 100 ug via INTRAVENOUS

## 2020-11-11 MED ORDER — PROPOFOL 10 MG/ML IV BOLUS
INTRAVENOUS | Status: AC
  Filled 2020-11-11: qty 20

## 2020-11-11 MED ORDER — DEXMEDETOMIDINE (PRECEDEX) IN NS 20 MCG/5ML (4 MCG/ML) IV SYRINGE
PREFILLED_SYRINGE | INTRAVENOUS | Status: DC | PRN
Start: 2020-11-11 — End: 2020-11-11
  Administered 2020-11-11: 8 ug via INTRAVENOUS

## 2020-11-11 MED ORDER — MIDAZOLAM HCL 2 MG/2ML IJ SOLN
INTRAMUSCULAR | Status: AC
  Filled 2020-11-11: qty 2

## 2020-11-11 MED ORDER — HYDROMORPHONE HCL 1 MG/ML IJ SOLN: 0.2500 mg | INTRAMUSCULAR | Status: DC | PRN

## 2020-11-11 MED ORDER — ACETAMINOPHEN 10 MG/ML IV SOLN: 1000.0000 mg | Freq: Once | INTRAVENOUS | Status: DC | PRN

## 2020-11-11 MED ORDER — ORAL CARE MOUTH RINSE: 15.0000 mL | Freq: Once | OROMUCOSAL | Status: AC

## 2020-11-11 MED ORDER — OXYCODONE HCL 5 MG/5ML PO SOLN: 5.0000 mg | Freq: Once | ORAL | Status: DC | PRN

## 2020-11-11 MED ORDER — PROPOFOL 500 MG/50ML IV EMUL
INTRAVENOUS | Status: DC | PRN
  Administered 2020-11-11: 25 ug/kg/min via INTRAVENOUS

## 2020-11-11 MED ORDER — MIDAZOLAM HCL 5 MG/5ML IJ SOLN
INTRAMUSCULAR | Status: DC | PRN
  Administered 2020-11-11: 2 mg via INTRAVENOUS

## 2020-11-11 SURGICAL SUPPLY — 30 items
ARMBAND PINK RESTRICT EXTREMIT (MISCELLANEOUS) ×2 IMPLANT
BAG COUNTER SPONGE SURGICOUNT (BAG) ×2 IMPLANT
CANISTER SUCT 3000ML PPV (MISCELLANEOUS) ×2 IMPLANT
CLIP LIGATING EXTRA MED SLVR (CLIP) ×2 IMPLANT
CLIP LIGATING EXTRA SM BLUE (MISCELLANEOUS) ×2 IMPLANT
CLIP VESOCCLUDE MED 6/CT (CLIP) ×2 IMPLANT
CLIP VESOCCLUDE SM WIDE 6/CT (CLIP) ×2 IMPLANT
COVER PROBE W GEL 5X96 (DRAPES) IMPLANT
DERMABOND ADVANCED (GAUZE/BANDAGES/DRESSINGS) ×1
DERMABOND ADVANCED .7 DNX12 (GAUZE/BANDAGES/DRESSINGS) ×1 IMPLANT
ELECT REM PT RETURN 9FT ADLT (ELECTROSURGICAL) ×2
ELECTRODE REM PT RTRN 9FT ADLT (ELECTROSURGICAL) ×1 IMPLANT
GLOVE SURG ENC MOIS LTX SZ7.5 (GLOVE) ×2 IMPLANT
GOWN STRL REUS W/ TWL LRG LVL3 (GOWN DISPOSABLE) ×2 IMPLANT
GOWN STRL REUS W/ TWL XL LVL3 (GOWN DISPOSABLE) ×1 IMPLANT
GOWN STRL REUS W/TWL LRG LVL3 (GOWN DISPOSABLE) ×2
GOWN STRL REUS W/TWL XL LVL3 (GOWN DISPOSABLE) ×1
INSERT FOGARTY SM (MISCELLANEOUS) IMPLANT
KIT BASIN OR (CUSTOM PROCEDURE TRAY) ×2 IMPLANT
KIT TURNOVER KIT B (KITS) ×2 IMPLANT
NS IRRIG 1000ML POUR BTL (IV SOLUTION) ×2 IMPLANT
PACK CV ACCESS (CUSTOM PROCEDURE TRAY) ×2 IMPLANT
PAD ARMBOARD 7.5X6 YLW CONV (MISCELLANEOUS) ×4 IMPLANT
SUT MNCRL AB 4-0 PS2 18 (SUTURE) ×2 IMPLANT
SUT PROLENE 6 0 BV (SUTURE) ×2 IMPLANT
SUT VIC AB 3-0 SH 27 (SUTURE) ×1
SUT VIC AB 3-0 SH 27X BRD (SUTURE) ×1 IMPLANT
TOWEL GREEN STERILE (TOWEL DISPOSABLE) ×2 IMPLANT
UNDERPAD 30X36 HEAVY ABSORB (UNDERPADS AND DIAPERS) ×2 IMPLANT
WATER STERILE IRR 1000ML POUR (IV SOLUTION) ×2 IMPLANT

## 2020-11-11 NOTE — Discharge Instructions (Signed)
° °  [facility] ° °Discharge Instructions ° °AV Fistula or Graft Surgery for Dialysis Access ° °Please refer to the following instructions for your post-procedure care. Your surgeon or physician assistant will discuss any changes with you. ° °Activity ° °You may drive the day following your surgery, if you are comfortable and no longer taking prescription pain medication. Resume full activity as the soreness in your incision resolves. ° °Bathing/Showering ° °You may shower after you go home. Keep your incision dry for 48 hours. Do not soak in a bathtub, hot tub, or swim until the incision heals completely. You may not shower if you have a hemodialysis catheter. ° °Incision Care ° °Clean your incision with mild soap and water after 48 hours. Pat the area dry with a clean towel. You do not need a bandage unless otherwise instructed. Do not apply any ointments or creams to your incision. You may have skin glue on your incision. Do not peel it off. It will come off on its own in about one week. Your arm may swell a bit after surgery. To reduce swelling use pillows to elevate your arm so it is above your heart. Your doctor will tell you if you need to lightly wrap your arm with an ACE bandage. ° °Diet ° °Resume your normal diet. There are not special food restrictions following this procedure. In order to heal from your surgery, it is CRITICAL to get adequate nutrition. Your body requires vitamins, minerals, and protein. Vegetables are the best source of vitamins and minerals. Vegetables also provide the perfect balance of protein. Processed food has little nutritional value, so try to avoid this. ° °Medications ° °Resume taking all of your medications. If your incision is causing pain, you may take over-the counter pain relievers such as acetaminophen (Tylenol). If you were prescribed a stronger pain medication, please be aware these medications can cause nausea and constipation. Prevent  nausea by taking the medication with a snack or meal. Avoid constipation by drinking plenty of fluids and eating foods with high amount of fiber, such as fruits, vegetables, and grains. Do not take Tylenol if you are taking prescription pain medications. ° ° ° ° °Follow up °Your surgeon may want to see you in the office following your access surgery. If so, this will be arranged at the time of your surgery. ° °Please call us immediately for any of the following conditions: ° °Increased pain, redness, drainage (pus) from your incision site °Fever of 101 degrees or higher °Severe or worsening pain at your incision site °Hand pain or numbness. ° °Reduce your risk of vascular disease: ° °Stop smoking. If you would like help, call [facility] at 1-800-QUIT-NOW (1-800-784-8669) or [facility] at 336-586-4000 ° °Manage your cholesterol °Maintain a desired weight °Control your diabetes °Keep your blood pressure down ° °Dialysis ° °It will take several weeks to several months for your new dialysis access to be ready for use. Your surgeon will determine when it is OK to use it. Your nephrologist will continue to direct your dialysis. You can continue to use your Permcath until your new access is ready for use. ° °If you have any questions, please call the office at [phone]. ° °

## 2020-11-11 NOTE — Anesthesia Procedure Notes (Signed)
  Anesthesia Regional Block: Supraclavicular block   Pre-Anesthetic Checklist: , timeout performed,  Correct Patient, Correct Site, Correct Laterality,  Correct Procedure, Correct Position, site marked,  Risks and benefits discussed,  Surgical consent,  Pre-op evaluation,  At surgeon's request and post-op pain management  Laterality: Left and Upper  Prep: chloraprep       Needles:  Injection technique: Single-shot  Needle Type: Echogenic Needle     Needle Length: 5cm  Needle Gauge: 21     Additional Needles:   Procedures:,,,, ultrasound used (permanent image in chart),,    Narrative:  Start time: 11/11/2020 7:13 AM End time: 11/11/2020 7:21 AM Injection made incrementally with aspirations every 5 mL.  Performed by: Personally  Anesthesiologist: Barnet Glasgow, MD

## 2020-11-11 NOTE — Anesthesia Postprocedure Evaluation (Signed)
Anesthesia Post Note  Patient: Nathan Campbell  Procedure(s) Performed: LEFT ARM First Stage Basilic Vein Fistula Creation (Left)     Patient location during evaluation: PACU Anesthesia Type: Regional Level of consciousness: awake and alert Pain management: pain level controlled Vital Signs Assessment: post-procedure vital signs reviewed and stable Respiratory status: spontaneous breathing, nonlabored ventilation, respiratory function stable and patient connected to nasal cannula oxygen Cardiovascular status: stable and blood pressure returned to baseline Postop Assessment: no apparent nausea or vomiting Anesthetic complications: no   No notable events documented.  Last Vitals:  Vitals:   11/11/20 0900 11/11/20 0915  BP: 119/79 116/73  Pulse: 71 71  Resp: 18 (!) 21  Temp:  36.5 C  SpO2: 92% 93%    Last Pain:  Vitals:   11/11/20 0915  TempSrc:   PainSc: 0-No pain                 Barnet Glasgow

## 2020-11-11 NOTE — Transfer of Care (Signed)
Immediate Anesthesia Transfer of Care Note  Patient: Nathan Campbell  Procedure(s) Performed: LEFT ARM First Stage Basilic Vein Fistula Creation (Left)  Patient Location: PACU  Anesthesia Type:MAC  Level of Consciousness: drowsy and patient cooperative  Airway & Oxygen Therapy: Patient Spontanous Breathing  Post-op Assessment: Report given to RN and Post -op Vital signs reviewed and stable  Post vital signs: Reviewed and stable  Last Vitals:  Vitals Value Taken Time  BP 121/64 11/11/20 0845  Temp 36.4 C 11/11/20 0845  Pulse 72 11/11/20 0851  Resp 23 11/11/20 0851  SpO2 94 % 11/11/20 0851  Vitals shown include unvalidated device data.  Last Pain:  Vitals:   11/11/20 0845  TempSrc:   PainSc: 0-No pain         Complications: No notable events documented.

## 2020-11-11 NOTE — Op Note (Signed)
    Patient name: Equan Cogbill MRN: [id number] DOB: 16-Feb-1976 Sex: male  11/11/2020 Pre-operative Diagnosis: CKD IV Post-operative diagnosis:  Same Surgeon:  Erlene Quan C. Donzetta Matters, MD Assistant: Arlee Muslim, PA Procedure Performed: Left arm brachial artery to basilic vein fistula creation   Indications: 45 year old male with chronic kidney disease.  He is right-hand dominant.  He is indicated for permanent dialysis access creation.  An assistant was necessary to facilitate exposure expedite the case.  Findings: There is a large basilic vein above the antecubitum.  This was dilated to 4 mm.  Brachial artery measured approximately 8 mm.  At completion there was a very strong thrill in the fistula confirmed with Doppler and a palpable radial artery pulse the wrist also confirmed with Doppler.   Procedure:  The patient was identified in the holding area and taken to the operating was placed supine on upper table and MAC anesthesia was induced.  He was sterilely prepped and draped in the left upper extremity in the usual fashion, antibiotics were ministered a timeout was called.  We began with testing his preoperative block which was placed this was noted to be intact.  Ultrasound was used to identify the basilic vein and a transverse incision was made above the antecubitum.  We dissected out the vein.  Branches were divided between ties.  We dissected through the deep fascia of the very large brachial artery placed a vessel loop around this.  The vein was marked for orientation and transected distally.  This was spatulated serially dilated to 4 mm flushed with heparinized saline and clamped.  The artery was clamped distally proximally opened longitudinally flushed with heparinized saline distally only given his large size.  The vein was then sewn end-to-side with 6-0 Prolene suture.  Prior completion without flushing all directions.  Upon completion there was a very strong thrill in the fistula and a  palpable radial artery pulse the wrist.  The wound was irrigated hemostasis was obtained we closed in layers with Vicryl and Monocryl.  Dermabond is placed to the level of the skin.  He was awakened from anesthesia having tolerated procedure without any complication.  All counts were correct at completion.   EBL: 20cc  Pasqual Farias C. Donzetta Matters, MD [facility] Office: [phone] Pager: [phone]

## 2020-11-11 NOTE — H&P (Signed)
HPI:   Nathan Campbell is a 45 y.o. male without previous vascular disease.  He is now being considered for dialysis.  He has never been on dialysis before.  He denies having any family history of dialysis.  He has hypertension as his main risk factor.  He is right-hand dominant.  He denies any chest breast or upper extremity surgery in the past.  He is not on any blood thinners.       Past Medical History:  Diagnosis Date   Acute combined systolic and diastolic CHF, NYHA class 4 ([place]) 06/10/2016   Cardiomyopathy ([place]) 06/14/2016   CHF (congestive heart failure) ([facility])     Dyspnea     Hypertension     Hypertensive heart and kidney disease with heart failure ([place]) 06/14/2016   Hypertensive heart disease with congestive heart failure ([place])     Obesity           Family History  Problem Relation Age of Onset   Hypertension Mother     Diabetes Father     Hypertension Father     Hypertension Sister     Hypertension Paternal Grandmother     Diabetes Mellitus II Paternal Grandfather     Heart disease Cousin     Heart disease Cousin     Colon cancer Cousin     Esophageal cancer Neg Hx     Rectal cancer Neg Hx     Stomach cancer Neg Hx           Past Surgical History:  Procedure Laterality Date   COLONOSCOPY       NO PAST SURGERIES          Short Social History:  Social History         Tobacco Use   Smoking status: Former      Packs/day: 0.00      Years: 5.00      Pack years: 0.00      Types: Cigarettes      Quit date: 09/2020      Years since quitting: 0.0   Smokeless tobacco: Never   Tobacco comments:      occasionally  Substance Use Topics   Alcohol use: Yes      Comment: socially      No Known Allergies         Current Outpatient Medications  Medication Sig Dispense Refill   allopurinol (ZYLOPRIM) 100 MG tablet Take 1 tablet (100 mg total) by mouth daily. 30 tablet 1   amLODipine (NORVASC) 10 MG tablet Take 1 tablet (10 mg total) by mouth daily. 30 tablet  1   calcitRIOL (ROCALTROL) 0.5 MCG capsule Take 0.5 mcg by mouth daily.       calcium carbonate (TUMS - DOSED IN MG ELEMENTAL CALCIUM) 500 MG chewable tablet Chew 1 tablet by mouth 3 (three) times daily with meals.       carvedilol (COREG) 25 MG tablet Take 1.5 tablets (37.5 mg total) by mouth 2 (two) times daily with a meal. 90 tablet 11   doxazosin (CARDURA) 8 MG tablet Take 8 mg by mouth at bedtime.   5   furosemide (LASIX) 80 MG tablet Take 160 mg by mouth 2 (two) times daily.       hydrALAZINE (APRESOLINE) 100 MG tablet Take 1 tablet (100 mg total) by mouth every 8 (eight) hours. 90 tablet 1   isosorbide mononitrate (IMDUR) 60 MG 24 hr tablet Take 1 tablet (60 mg total)  by mouth daily. 30 tablet 1   trimethoprim-polymyxin b (POLYTRIM) ophthalmic solution SMARTSIG:In Eye(s)        No current facility-administered medications for this visit.      Review of Systems  Constitutional:  Constitutional negative. HENT: HENT negative.  Eyes: Eyes negative.  Respiratory: Respiratory negative.  Cardiovascular: Cardiovascular negative.  GI: Gastrointestinal negative.  Musculoskeletal: Musculoskeletal negative.  Skin: Skin negative.  Neurological: Neurological negative. Hematologic: Hematologic/lymphatic negative.  Psychiatric: Psychiatric negative.         Objective:    Vitals:   11/11/20 0626  BP: (!) 145/82  Pulse: 82  Resp: 18  Temp: 98.3 F (36.8 C)  SpO2: 97%      Physical Exam HENT:     Head: Normocephalic.     Nose:     Comments: Wearing a mask Eyes:     Pupils: Pupils are equal, round, and reactive to light.  Cardiovascular:     Rate and Rhythm: Normal rate.     Pulses:          Radial pulses are 2+ on the right side and 2+ on the left side.  Pulmonary:     Effort: Pulmonary effort is normal.  Abdominal:     General: Abdomen is flat.     Palpations: Abdomen is soft.  Musculoskeletal:        General: Normal range of motion.  Skin:    Capillary Refill:  Capillary refill takes less than 2 seconds.  Neurological:     General: No focal deficit present.     Mental Status: He is alert.  Psychiatric:        Mood and Affect: Mood normal.        Behavior: Behavior normal.        Thought Content: Thought content normal.        Judgment: Judgment normal.      Data: +-----------------+-------------+----------+--------+  Right Cephalic   Diameter (cm)Depth (cm)Findings  +-----------------+-------------+----------+--------+  Shoulder             0.28                         +-----------------+-------------+----------+--------+  Prox upper arm       0.25                         +-----------------+-------------+----------+--------+  Mid upper arm        0.28                         +-----------------+-------------+----------+--------+  Dist upper arm       0.23                         +-----------------+-------------+----------+--------+  Antecubital fossa    0.19                         +-----------------+-------------+----------+--------+  Prox forearm         0.58                         +-----------------+-------------+----------+--------+  Mid forearm          0.41                         +-----------------+-------------+----------+--------+  Dist forearm  0.43                         +-----------------+-------------+----------+--------+   +-----------------+-------------+----------+--------+  Right Basilic    Diameter (cm)Depth (cm)Findings  +-----------------+-------------+----------+--------+  Mid upper arm        0.50                         +-----------------+-------------+----------+--------+  Dist upper arm       0.59                         +-----------------+-------------+----------+--------+  Antecubital fossa    0.60                         +-----------------+-------------+----------+--------+    +-----------------+-------------+----------+--------------+  Left Cephalic    Diameter (cm)Depth (cm)   Findings     +-----------------+-------------+----------+--------------+  Shoulder             0.29                               +-----------------+-------------+----------+--------------+  Prox upper arm       0.24               out of fascia   +-----------------+-------------+----------+--------------+  Mid upper arm                           not visualized  +-----------------+-------------+----------+--------------+  Dist upper arm                          not visualized  +-----------------+-------------+----------+--------------+  Antecubital fossa                       not visualized  +-----------------+-------------+----------+--------------+  Prox forearm         0.60                               +-----------------+-------------+----------+--------------+  Mid forearm          0.50                 branching     +-----------------+-------------+----------+--------------+  Dist forearm         0.47                               +-----------------+-------------+----------+--------------+   +-----------------+-------------+----------+--------+  Left Basilic     Diameter (cm)Depth (cm)Findings  +-----------------+-------------+----------+--------+  Mid upper arm        0.61                         +-----------------+-------------+----------+--------+  Dist upper arm       0.46                         +-----------------+-------------+----------+--------+  Antecubital fossa    0.44                         +-----------------+-------------+----------+--------+       Assessment/Plan:       45 year old male here for initial dialysis access.  He does have suitable left basilic vein.  We have been asked to wait on graft I do not see a cephalic vein on my exam nor was it seen on duplex today.  He is right-hand  dominant.  I discussed the options for hemodialysis including catheter, fistula, graft.  I have also discussed the preference to begin with his nondominant left upper extremity and we will prepare for first stage basilic vein fistula creation today in the OR.  He understands that this will require 2 procedures.  All questions were answered to his satisfaction in the presence of his mother.         Waynetta Sandy MD [facility]

## 2020-11-12 ENCOUNTER — Encounter (HOSPITAL_COMMUNITY): Payer: Self-pay | Admitting: Vascular Surgery

## 2020-12-05 IMAGING — DX DG FOOT COMPLETE 3+V*R*
3 series · 3 of 3 positions shown · non-contrast
Comparison: None.

CLINICAL DATA: Right foot swelling. History of gout.

EXAM:
RIGHT FOOT COMPLETE - 3+ VIEW

[foot ap]
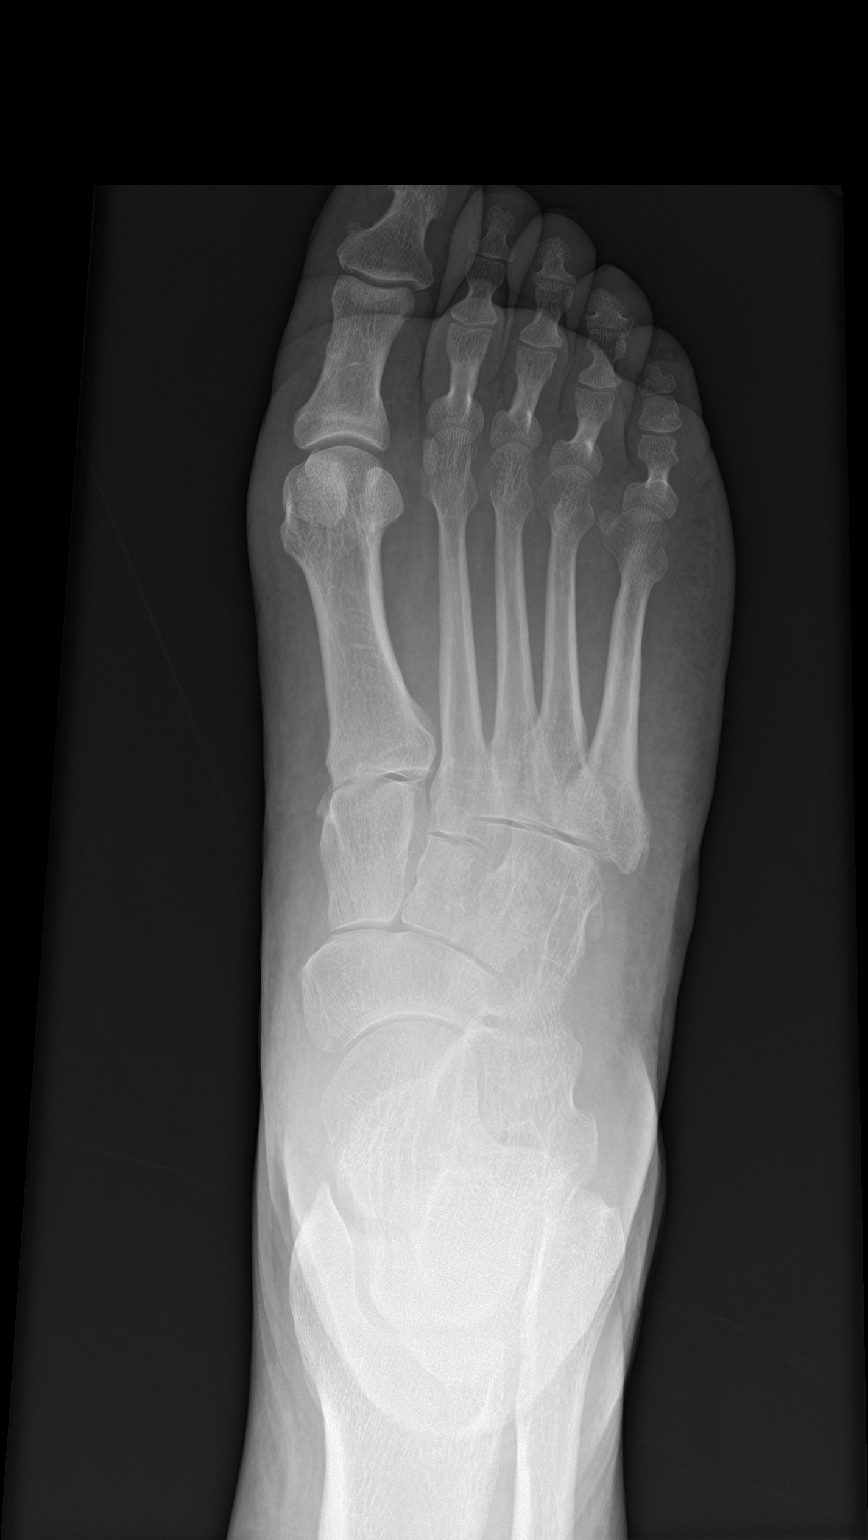

[foot obl]
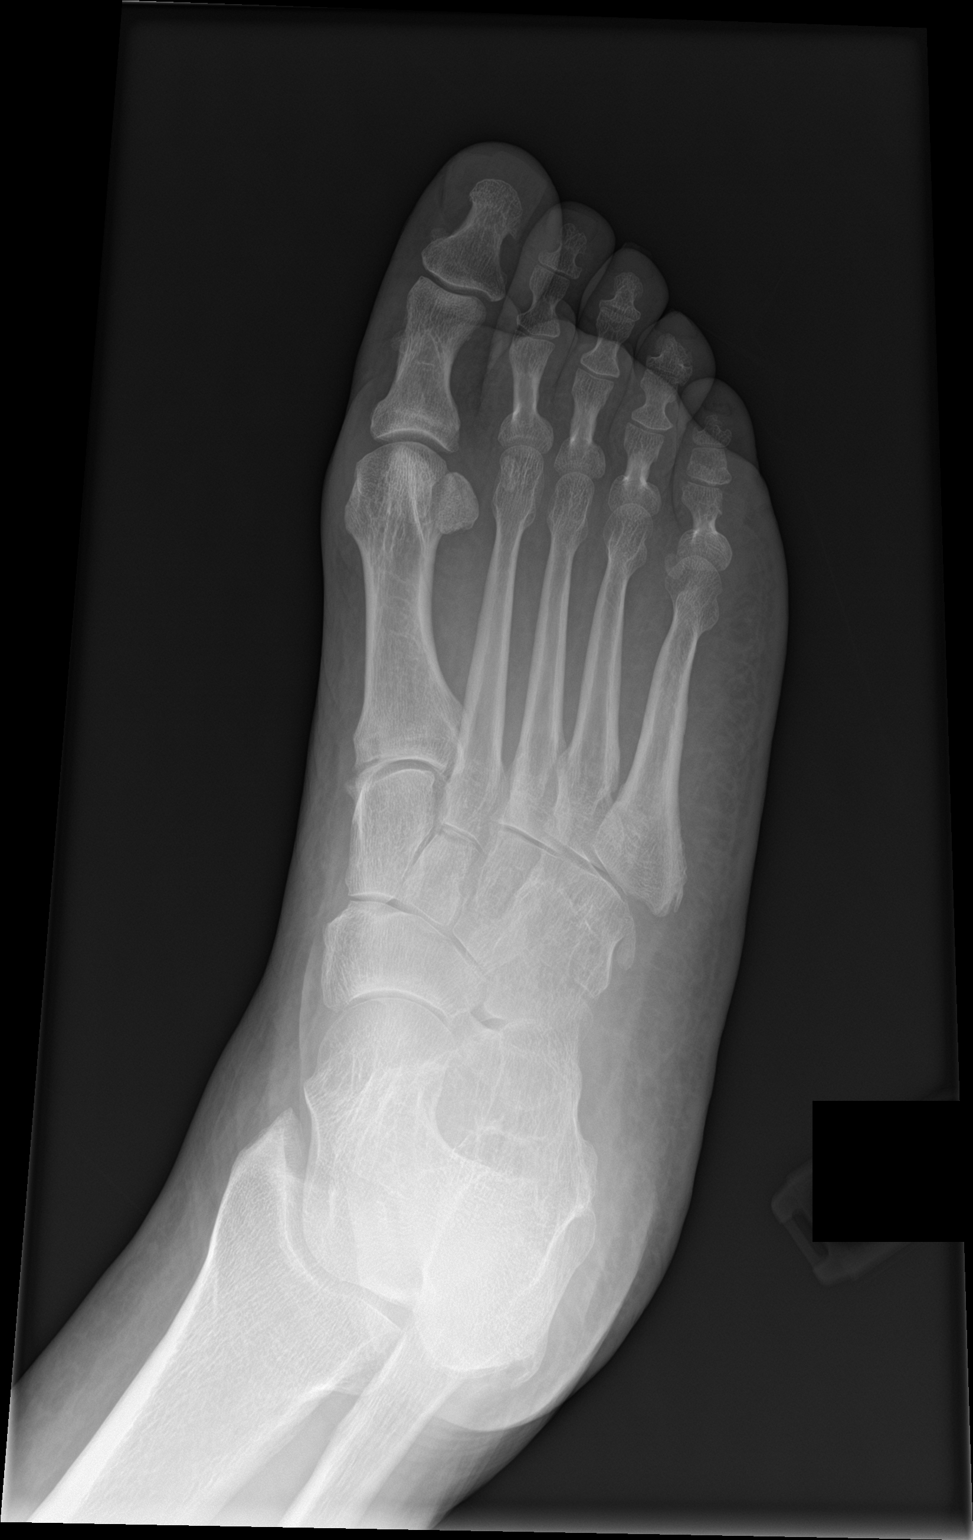

[foot lat]
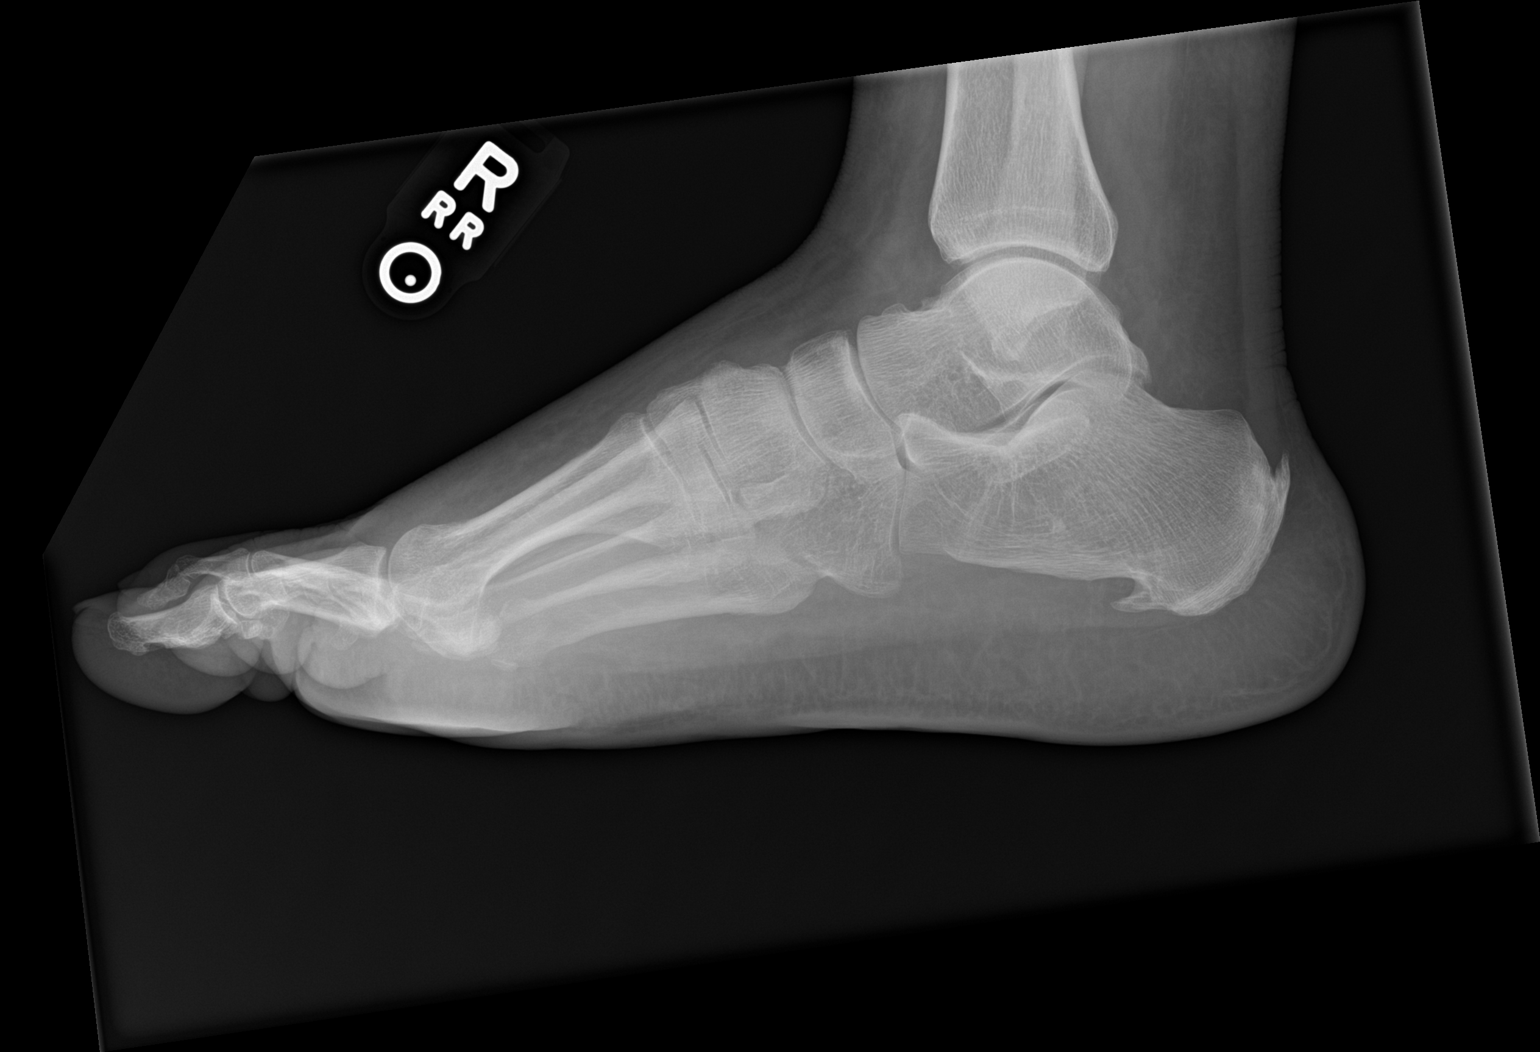

[3 of 3 positions shown; findings below may reference images not displayed]

FINDINGS: There is no evidence of fracture or dislocation. No focal bone
erosions identified. Posterior and plantar calcaneal heel spurs
noted. Diffuse soft tissue swelling noted.
IMPRESSION: 1. No acute bone abnormality.
2. Soft tissue swelling.
3. Heel spurs.

## 2020-12-09 ENCOUNTER — Other Ambulatory Visit: Payer: Self-pay

## 2020-12-09 DIAGNOSIS — N184 Chronic kidney disease, stage 4 (severe): Secondary | ICD-10-CM

## 2020-12-17 ENCOUNTER — Other Ambulatory Visit: Payer: Self-pay

## 2020-12-17 ENCOUNTER — Ambulatory Visit (HOSPITAL_COMMUNITY)
Admission: RE | Admit: 2020-12-17 | Discharge: 2020-12-17 | Disposition: A | Discharge status: Home / Self Care | Payer: 59 | Source: Ambulatory Visit | Attending: Vascular Surgery | Admitting: Vascular Surgery

## 2020-12-17 ENCOUNTER — Ambulatory Visit (INDEPENDENT_AMBULATORY_CARE_PROVIDER_SITE_OTHER): Payer: Self-pay | Admitting: Physician Assistant

## 2020-12-17 VITALS — BP 137/87 | HR 90 | Temp 98.0°F | Resp 20 | Ht 72.0 in | Wt 315.0 lb

## 2020-12-17 DIAGNOSIS — N184 Chronic kidney disease, stage 4 (severe): Secondary | ICD-10-CM | POA: Insufficient documentation

## 2020-12-17 NOTE — Progress Notes (Signed)
    Postoperative Access Visit   History of Present Illness   Nathan Campbell is a 45 y.o. year old male who presents for postoperative follow-up for: left arm brachial artery to basilic vein fistula creation 11/11/20 with Dr. Donzetta Matters. The patient's wounds are well healed.  The patient notes no steal symptoms.  The patient is able to complete their activities of daily living.  The patient's current symptoms are: mild cramping in left hand intermittently. He is not currently on HD.  Physical Examination   Vitals:   12/17/20 1144  BP: 137/87  Pulse: 90  Resp: 20  Temp: 98 F (36.7 C)  TempSrc: Temporal  SpO2: 94%  Weight: (!) 315 lb (142.9 kg)  Height: 6' (1.829 m)   Body mass index is 42.72 kg/m.  left arm Incision is well healed, 2+ radial pulse, hand grip is 5/5, sensation in digits is intact, great palpable thrill, bruit can be auscultated    Non invasive vascular lab: 12/17/20 Findings:  +--------------------+----------+-----------------+--------+  AVF                 PSV (cm/s)Flow Vol (mL/min)Comments  +--------------------+----------+-----------------+--------+  Native artery inflow   211          2046                 +--------------------+----------+-----------------+--------+  AVF Anastomosis        580                               +--------------------+----------+-----------------+--------+      +------------+----------+-------------+----------+--------+  OUTFLOW VEINPSV (cm/s)Diameter (cm)Depth (cm)Describe  +------------+----------+-------------+----------+--------+  Prox UA        107        0.87        1.49             +------------+----------+-------------+----------+--------+  Mid UA         104        0.96        1.46             +------------+----------+-------------+----------+--------+  Dist UA        116        0.79        1.26             +------------+----------+-------------+----------+--------+  AC Fossa        218        0.86        1.40             +------------+----------+-------------+----------+--------+     Summary:  Patent arteriovenous fistula.   Medical Decision Making   Nathan Campbell is a 45 y.o. year old male who presents s/p left arm brachial artery to basilic vein fistula creation 11/11/20 with Dr. Donzetta Matters. Fistula is working well. He has no signs or symptoms of steal. Duplex shows patent AV Fistula that has matured nicely. Fistula is deep in the left upper arm. Discussed with patient need for AVF transposition/ elevation. He is not currently on HD. Will arranged this in the near future Patent no without signs or symptoms of steal syndrome Schedule left AVF transposition with Dr. Jeri Lager, PA-C [facility] [place] Office: [phone]  Clinic MD: Cain/ Scot Dock

## 2021-01-22 ENCOUNTER — Ambulatory Visit
Admission: EM | Admit: 2021-01-22 | Discharge: 2021-01-22 | Disposition: A | Discharge status: Home / Self Care | Payer: 59 | Attending: Emergency Medicine | Admitting: Emergency Medicine

## 2021-01-22 ENCOUNTER — Other Ambulatory Visit: Payer: Self-pay

## 2021-01-22 ENCOUNTER — Ambulatory Visit: Payer: 59 | Admitting: Cardiovascular Disease

## 2021-01-22 DIAGNOSIS — N185 Chronic kidney disease, stage 5: Secondary | ICD-10-CM

## 2021-01-22 DIAGNOSIS — N179 Acute kidney failure, unspecified: Secondary | ICD-10-CM | POA: Diagnosis not present

## 2021-01-22 DIAGNOSIS — M79671 Pain in right foot: Secondary | ICD-10-CM

## 2021-01-22 DIAGNOSIS — Z8739 Personal history of other diseases of the musculoskeletal system and connective tissue: Secondary | ICD-10-CM

## 2021-01-22 DIAGNOSIS — M25561 Pain in right knee: Secondary | ICD-10-CM

## 2021-01-22 NOTE — ED Provider Notes (Signed)
I advised [facility]    CSN: [id number] Arrival date & time: 01/22/21  4259      History   Chief Complaint No chief complaint on file.   HPI Nathan Campbell is a 45 y.o. male.   Patient reports a history of gout, having intermittent bouts of pain and swelling in his right knee and right foot.  Patient also states that tomorrow he is scheduled for surgery for fistula placement, states that he currently sees a nephrologist who is planning on starting him on dialysis soon.  The history is provided by the patient.   Past Medical History:  Diagnosis Date   Acute combined systolic and diastolic CHF, NYHA class 4 (H[facility]) 06/10/2016   Cardiomyopathy ([place]) 06/14/2016   CHF (congestive heart failure) ([place])    CKD (chronic kidney disease)    COVID 10/2020   Dyspnea    Hypertension    Hypertensive heart and kidney disease with heart failure ([place]) 06/14/2016   Hypertensive heart disease with congestive heart failure ([place])    Obesity     Patient Active Problem List   Diagnosis Date Noted   Change in bowel habits 08/13/2020   Constipation 08/13/2020   Acute bilateral low back pain without sciatica 07/04/2019   Acute gout due to renal impairment involving left wrist 04/24/2019   Malignant hypertension 10/14/2016   Hypertensive heart disease with CHF (congestive heart failure) ([place]) 06/14/2016   NICM (nonischemic cardiomyopathy) ([place]) 06/14/2016   Renal failure    Hypertensive urgency 06/10/2016   CKD (chronic kidney disease), stage IV ([place]) 06/10/2016   Normocytic anemia 06/10/2016    Past Surgical History:  Procedure Laterality Date   AV FISTULA PLACEMENT Left 11/11/2020   Procedure: LEFT ARM First Stage Basilic Vein Fistula Creation;  Surgeon: Waynetta Sandy, MD;  Location[facility] OR;  Service: Vascular;  Laterality: Left;   COLONOSCOPY     NO PAST SURGERIES         Home Medications    Prior to Admission medications   Medication Sig Start Date End Date  Taking? Authorizing Provider  allopurinol (ZYLOPRIM) 100 MG tablet Take 1 tablet (100 mg total) by mouth daily. 05/10/19   Rosemarie Ax, MD  amLODipine (NORVASC) 10 MG tablet Take 1 tablet (10 mg total) by mouth daily. 06/16/16   Verlee Monte, MD  bacitracin-neomycin-polymyxin-hydrocortisone (CORTISPORIN) 1 % ophthalmic ointment SMARTSIG:In Eye(s) 12/23/20   [provider]  calcitRIOL (ROCALTROL) 0.5 MCG capsule Take 0.5 mcg by mouth daily. 08/26/20   [provider]  calcium carbonate (TUMS - DOSED IN MG ELEMENTAL CALCIUM) 500 MG chewable tablet Chew 1 tablet by mouth 3 (three) times daily with meals.    [provider]  carvedilol (COREG) 25 MG tablet Take 1.5 tablets (37.5 mg total) by mouth 2 (two) times daily with a meal. Patient taking differently: Take 25 mg by mouth 2 (two) times daily with a meal. 10/11/16   Croitoru, Mihai, MD  doxazosin (CARDURA) 8 MG tablet Take 8 mg by mouth at bedtime. 11/03/17   [provider]  hydrALAZINE (APRESOLINE) 100 MG tablet Take 1 tablet (100 mg total) by mouth every 8 (eight) hours. 06/15/16   Verlee Monte, MD  HYDROcodone-acetaminophen (NORCO) 5-325 MG tablet Take 1 tablet by mouth every 6 (six) hours as needed for moderate pain. 11/11/20   Dagoberto Ligas, PA-C  isosorbide mononitrate (IMDUR) 60 MG 24 hr tablet Take 1 tablet (60 mg total) by mouth daily. 06/16/16   Verlee Monte,  MD  predniSONE (DELTASONE) 20 MG tablet Take 1.5 tablets (30 mg total) by mouth daily with breakfast. 10/22/20   Marney Setting, NP  torsemide (DEMADEX) 20 MG tablet Take 60 mg by mouth 2 (two) times daily. 10/08/20   [provider]    Family History Family History  Problem Relation Age of Onset   Hypertension Mother    Diabetes Father    Hypertension Father    Hypertension Sister    Hypertension Paternal Grandmother    Diabetes Mellitus II Paternal Grandfather    Heart disease Cousin    Heart disease Cousin    Colon cancer  Cousin    Esophageal cancer Neg Hx    Rectal cancer Neg Hx    Stomach cancer Neg Hx     Social History Social History   Tobacco Use   Smoking status: Former    Packs/day: 0.00    Years: 5.00    Pack years: 0.00    Types: Cigarettes    Quit date: 09/2020    Years since quitting: 0.3   Smokeless tobacco: Never   Tobacco comments:    occasionally  Vaping Use   Vaping Use: Former  Substance Use Topics   Alcohol use: Not Currently    Comment: socially   Drug use: Not Currently    Types: Marijuana     Allergies   Patient has no known allergies.   Review of Systems Review of Systems Pertinent findings noted in history of present illness.    Physical Exam Triage Vital Signs ED Triage Vitals  Enc Vitals Group     BP 01/09/21 0827 (!) 147/82     Pulse Rate 01/09/21 0827 72     Resp 01/09/21 0827 18     Temp 01/09/21 0827 98.3 F (36.8 C)     Temp Source 01/09/21 0827 Oral     SpO2 01/09/21 0827 98 %     Weight --      Height --      Head Circumference --      Peak Flow --      Pain Score 01/09/21 0826 5     Pain Loc --      Pain Edu? --      Excl. in [place]? --    No data found.  Updated Vital Signs BP 116/70 (BP Location: Right Arm)   Pulse 79   Temp 99.5 F (37.5 C) (Oral)   Resp 20   SpO2 95%   Visual Acuity Right Eye Distance:   Left Eye Distance:   Bilateral Distance:    Right Eye Near:   Left Eye Near:    Bilateral Near:     Physical Exam Vitals and nursing note reviewed.  Constitutional:      General: He is not in acute distress.    Appearance: Normal appearance. He is not ill-appearing.  HENT:     Head: Normocephalic and atraumatic.  Eyes:     General: Lids are normal.        Right eye: No discharge.        Left eye: No discharge.     Extraocular Movements: Extraocular movements intact.     Conjunctiva/sclera: Conjunctivae normal.     Right eye: Right conjunctiva is not injected.     Left eye: Left conjunctiva is not injected.   Neck:     Trachea: Trachea and phonation normal.  Cardiovascular:     Rate and Rhythm: Normal rate and regular rhythm.  Pulses: Normal pulses.     Heart sounds: Normal heart sounds. No murmur heard.   No friction rub. No gallop.  Pulmonary:     Effort: Pulmonary effort is normal. No accessory muscle usage, prolonged expiration or respiratory distress.     Breath sounds: Normal breath sounds. No stridor, decreased air movement or transmitted upper airway sounds. No decreased breath sounds, wheezing, rhonchi or rales.  Chest:     Chest wall: No tenderness.  Musculoskeletal:        General: Swelling and tenderness present. Normal range of motion.     Cervical back: Normal range of motion and neck supple. Normal range of motion.     Right lower leg: Edema present.  Lymphadenopathy:     Cervical: No cervical adenopathy.  Skin:    General: Skin is warm and dry.     Findings: No erythema or rash.  Neurological:     General: No focal deficit present.     Mental Status: He is alert and oriented to person, place, and time.  Psychiatric:        Mood and Affect: Mood normal.        Behavior: Behavior normal.     UC Treatments / Results  Labs (all labs ordered are listed, but only abnormal results are displayed) Labs Reviewed  CBC WITH DIFFERENTIAL/PLATELET  URIC ACID    EKG   Radiology No results found.  Procedures Procedures (including critical care time)  Medications Ordered in UC Medications - No data to display  Initial Impression / Assessment and Plan / UC Course  I have reviewed the triage vital signs and the nursing notes.  Pertinent labs & imaging results that were available during my care of the patient were reviewed by me and considered in my medical decision making (see chart for details).     Patient advised that I am not able to treat him for gout at this time given his advanced kidney disease.  Patient advised that I am happy to check his uric acid  level today and will report the result to his nephrologist once is received.    Patient verbalized understanding and agreement of plan as discussed.  All questions were addressed during visit.  Please see discharge instructions below for further details of plan.  Final Clinical Impressions(s) / UC Diagnoses   Final diagnoses:  History of gout  Acute pain of right knee  Acute foot pain, right  Acute renal failure superimposed on stage 5 chronic kidney disease, not on chronic dialysis, unspecified acute renal failure type [facility])     Discharge Instructions      The results of the uric acid level test that we did today will be made available to you and your kidney doctor as it is they are complete, this typically takes a day.  Please look over the enclosed instructions about gout for helpful suggestions to help reduce your symptoms.     ED Prescriptions   None    PDMP not reviewed this encounter.   Disposition Upon Discharge:   The patient will follow up with their current PCP if and as advised. If the patient does not currently have a PCP we will assist them in obtaining one.   The patient may need specialty follow up if the symptoms continue, in spite of conservative treatment and management, for further workup, evaluation, consultation and treatment as clinically indicated and appropriate.  Routine symptom specific, illness specific and/or disease specific instructions were discussed with  the patient and/or caregiver at length; Patient presented with an acute illness with associated systemic symptoms and significant discomfort requiring acute urgent management. In my opinion, this is a condition that a prudent lay person (someone who possesses an average knowledge of health and medicine) may potentially expect to result in serious jeopardy, cause serious impairment of bodily function or result in serious dysfunction of bodily organs. As such, the patient has been evaluated and  assessed; workup and treatment have begun; but the patient may require follow up for further testing and treatment if the symptoms continue in spite of treatment, as clinically indicated and appropriate.  Condition: stable for discharge home Home: take medications as prescribed; routine discharge instructions as discussed; follow up as advised.    Lynden Oxford Scales, PA-C 01/22/21 1710

## 2021-01-22 NOTE — Progress Notes (Signed)
All numbers on file attempted - pt could not be reached. LVM on all numbers for pre-op instructions and call back number. Spoke to mother who was driving at the time and made sure she knew to bring pt for surgery at 0530 and NPO after mn.

## 2021-01-22 NOTE — Discharge Instructions (Addendum)
The results of the uric acid level test that we did today will be made available to you and your kidney doctor as it is they are complete, this typically takes a day.  Please look over the enclosed instructions about gout for helpful suggestions to help reduce your symptoms.

## 2021-01-22 NOTE — ED Triage Notes (Signed)
Pt reports having gout in his right knee and foot. Started: 2 days ago

## 2021-01-22 NOTE — Anesthesia Preprocedure Evaluation (Addendum)
Anesthesia Evaluation  Patient identified by MRN, date of birth, ID band Patient awake    Reviewed: Allergy & Precautions, NPO status , Patient's Chart, lab work & pertinent test results, reviewed documented beta blocker date and time   Airway Mallampati: IV  TM Distance: >3 FB Neck ROM: Full    Dental  (+) Dental Advisory Given, Teeth Intact   Pulmonary neg pulmonary ROS, former smoker,    Pulmonary exam normal breath sounds clear to auscultation       Cardiovascular hypertension, Pt. on medications and Pt. on home beta blockers +CHF (diastolic)  Normal cardiovascular exam Rhythm:Regular Rate:Normal  Echo 03/2019: LVEF 76-19%, grade 1 diastolic dysfunction    Neuro/Psych negative neurological ROS  negative psych ROS   GI/Hepatic negative GI ROS, Neg liver ROS,   Endo/Other  Morbid obesityBMI 43 FS 109  Renal/GU ESRF and DialysisRenal diseaseK 4.0  negative genitourinary   Musculoskeletal  (+) Arthritis , Osteoarthritis,    Abdominal (+) + obese,   Peds  Hematology  (+) Blood dyscrasia, anemia , 8.8/26, plt 208   Anesthesia Other Findings   Reproductive/Obstetrics negative OB ROS                            Anesthesia Physical Anesthesia Plan  ASA: 3  Anesthesia Plan: MAC and Regional   Post-op Pain Management:  Regional for Post-op pain   Induction:   PONV Risk Score and Plan: 2 and Propofol infusion and TIVA  Airway Management Planned: Natural Airway and Simple Face Mask  Additional Equipment: None  Intra-op Plan:   Post-operative Plan:   Informed Consent: I have reviewed the patients History and Physical, chart, labs and discussed the procedure including the risks, benefits and alternatives for the proposed anesthesia with the patient or authorized representative who has indicated his/her understanding and acceptance.     Dental advisory given  Plan Discussed with:  CRNA  Anesthesia Plan Comments:        Anesthesia Quick Evaluation

## 2021-01-23 ENCOUNTER — Encounter (HOSPITAL_COMMUNITY)
Admission: RE | Disposition: A | Discharge status: Home / Self Care | Payer: Self-pay | Source: Home / Self Care | Attending: Vascular Surgery

## 2021-01-23 ENCOUNTER — Ambulatory Visit (HOSPITAL_COMMUNITY)
Admission: RE | Admit: 2021-01-23 | Discharge: 2021-01-23 | Disposition: A | Discharge status: Home / Self Care | Payer: 59 | Attending: Vascular Surgery | Admitting: Vascular Surgery

## 2021-01-23 ENCOUNTER — Encounter (HOSPITAL_COMMUNITY): Payer: Self-pay | Admitting: Vascular Surgery

## 2021-01-23 ENCOUNTER — Other Ambulatory Visit: Payer: Self-pay

## 2021-01-23 ENCOUNTER — Ambulatory Visit (HOSPITAL_COMMUNITY): Payer: 59 | Admitting: Anesthesiology

## 2021-01-23 DIAGNOSIS — I5030 Unspecified diastolic (congestive) heart failure: Secondary | ICD-10-CM | POA: Diagnosis not present

## 2021-01-23 DIAGNOSIS — I770 Arteriovenous fistula, acquired: Secondary | ICD-10-CM | POA: Insufficient documentation

## 2021-01-23 DIAGNOSIS — M1990 Unspecified osteoarthritis, unspecified site: Secondary | ICD-10-CM | POA: Insufficient documentation

## 2021-01-23 DIAGNOSIS — Z87891 Personal history of nicotine dependence: Secondary | ICD-10-CM | POA: Diagnosis not present

## 2021-01-23 DIAGNOSIS — T82898A Other specified complication of vascular prosthetic devices, implants and grafts, initial encounter: Secondary | ICD-10-CM | POA: Diagnosis not present

## 2021-01-23 DIAGNOSIS — I130 Hypertensive heart and chronic kidney disease with heart failure and stage 1 through stage 4 chronic kidney disease, or unspecified chronic kidney disease: Secondary | ICD-10-CM | POA: Insufficient documentation

## 2021-01-23 DIAGNOSIS — D649 Anemia, unspecified: Secondary | ICD-10-CM | POA: Diagnosis not present

## 2021-01-23 DIAGNOSIS — N184 Chronic kidney disease, stage 4 (severe): Secondary | ICD-10-CM

## 2021-01-23 DIAGNOSIS — D759 Disease of blood and blood-forming organs, unspecified: Secondary | ICD-10-CM | POA: Diagnosis not present

## 2021-01-23 DIAGNOSIS — Z6841 Body Mass Index (BMI) 40.0 and over, adult: Secondary | ICD-10-CM | POA: Diagnosis not present

## 2021-01-23 HISTORY — PX: BASCILIC VEIN TRANSPOSITION: SHX5742

## 2021-01-23 LAB — CBC WITH DIFFERENTIAL/PLATELET
Basophils Absolute: 0 10*3/uL (ref 0.0–0.2)
Basos: 0 %
EOS (ABSOLUTE): 0.1 10*3/uL (ref 0.0–0.4)
Eos: 1 %
Hematocrit: 28.9 % — ABNORMAL LOW (ref 37.5–51.0)
Hemoglobin: 9.2 g/dL — ABNORMAL LOW (ref 13.0–17.7)
Immature Grans (Abs): 0 10*3/uL (ref 0.0–0.1)
Immature Granulocytes: 0 %
Lymphocytes Absolute: 0.8 10*3/uL (ref 0.7–3.1)
Lymphs: 10 %
MCH: 29.7 pg (ref 26.6–33.0)
MCHC: 31.8 g/dL (ref 31.5–35.7)
MCV: 93 fL (ref 79–97)
Monocytes Absolute: 0.8 10*3/uL (ref 0.1–0.9)
Monocytes: 10 %
Neutrophils Absolute: 6.2 10*3/uL (ref 1.4–7.0)
Neutrophils: 79 %
Platelets: 208 10*3/uL (ref 150–450)
RBC: 3.1 x10E6/uL — ABNORMAL LOW (ref 4.14–5.80)
RDW: 12 % (ref 11.6–15.4)
WBC: 8 10*3/uL (ref 3.4–10.8)

## 2021-01-23 LAB — POCT I-STAT, CHEM 8
BUN: 116 mg/dL — ABNORMAL HIGH (ref 6–20)
Calcium, Ion: 1.1 mmol/L — ABNORMAL LOW (ref 1.15–1.40)
Chloride: 105 mmol/L (ref 98–111)
Creatinine, Ser: 9.9 mg/dL — ABNORMAL HIGH (ref 0.61–1.24)
Glucose, Bld: 109 mg/dL — ABNORMAL HIGH (ref 70–99)
HCT: 26 % — ABNORMAL LOW (ref 39.0–52.0)
Hemoglobin: 8.8 g/dL — ABNORMAL LOW (ref 13.0–17.0)
Potassium: 4 mmol/L (ref 3.5–5.1)
Sodium: 142 mmol/L (ref 135–145)
TCO2: 22 mmol/L (ref 22–32)

## 2021-01-23 LAB — URIC ACID: Uric Acid: 9.4 mg/dL — ABNORMAL HIGH (ref 3.8–8.4)

## 2021-01-23 SURGERY — TRANSPOSITION, VEIN, BASILIC
Anesthesia: Monitor Anesthesia Care | Site: Arm Upper | Laterality: Left

## 2021-01-23 MED ORDER — DEXMEDETOMIDINE (PRECEDEX) IN NS 20 MCG/5ML (4 MCG/ML) IV SYRINGE
PREFILLED_SYRINGE | INTRAVENOUS | Status: DC | PRN
  Administered 2021-01-23 (×2): 4 ug via INTRAVENOUS
  Administered 2021-01-23: 8 ug via INTRAVENOUS
  Administered 2021-01-23: 4 ug via INTRAVENOUS

## 2021-01-23 MED ORDER — OXYCODONE HCL 5 MG PO TABS
ORAL_TABLET | ORAL | Status: AC
  Filled 2021-01-23: qty 1

## 2021-01-23 MED ORDER — PROPOFOL 500 MG/50ML IV EMUL
INTRAVENOUS | Status: DC | PRN
  Administered 2021-01-23: 50 ug/kg/min via INTRAVENOUS

## 2021-01-23 MED ORDER — SODIUM CHLORIDE 0.9 % IV SOLN: INTRAVENOUS | Status: DC

## 2021-01-23 MED ORDER — PROPOFOL 10 MG/ML IV BOLUS
INTRAVENOUS | Status: DC | PRN
  Administered 2021-01-23: 100 mg via INTRAVENOUS

## 2021-01-23 MED ORDER — HYDROCODONE-ACETAMINOPHEN 5-325 MG PO TABS: 1.0000 | ORAL_TABLET | Freq: Four times a day (QID) | ORAL | 0 refills | Status: DC | PRN

## 2021-01-23 MED ORDER — EPHEDRINE 5 MG/ML INJ
INTRAVENOUS | Status: AC
  Filled 2021-01-23: qty 5

## 2021-01-23 MED ORDER — CHLORHEXIDINE GLUCONATE 0.12 % MT SOLN: 15.0000 mL | Freq: Once | OROMUCOSAL | Status: AC

## 2021-01-23 MED ORDER — CHLORHEXIDINE GLUCONATE 4 % EX LIQD: 60.0000 mL | Freq: Once | CUTANEOUS | Status: DC

## 2021-01-23 MED ORDER — ORAL CARE MOUTH RINSE: 15.0000 mL | Freq: Once | OROMUCOSAL | Status: AC

## 2021-01-23 MED ORDER — ACETAMINOPHEN 500 MG PO TABS: 1000.0000 mg | ORAL_TABLET | Freq: Once | ORAL | Status: AC

## 2021-01-23 MED ORDER — 0.9 % SODIUM CHLORIDE (POUR BTL) OPTIME
TOPICAL | Status: DC | PRN
  Administered 2021-01-23: 1000 mL

## 2021-01-23 MED ORDER — PHENYLEPHRINE 40 MCG/ML (10ML) SYRINGE FOR IV PUSH (FOR BLOOD PRESSURE SUPPORT)
PREFILLED_SYRINGE | INTRAVENOUS | Status: DC | PRN
  Administered 2021-01-23 (×6): 80 ug via INTRAVENOUS

## 2021-01-23 MED ORDER — OXYCODONE HCL 5 MG/5ML PO SOLN: 5.0000 mg | Freq: Once | ORAL | Status: AC | PRN

## 2021-01-23 MED ORDER — ONDANSETRON HCL 4 MG/2ML IJ SOLN
INTRAMUSCULAR | Status: AC
  Filled 2021-01-23: qty 2

## 2021-01-23 MED ORDER — PHENYLEPHRINE 40 MCG/ML (10ML) SYRINGE FOR IV PUSH (FOR BLOOD PRESSURE SUPPORT)
PREFILLED_SYRINGE | INTRAVENOUS | Status: AC
  Filled 2021-01-23: qty 20

## 2021-01-23 MED ORDER — ACETAMINOPHEN 500 MG PO TABS
ORAL_TABLET | ORAL | Status: AC
  Administered 2021-01-23: 1000 mg via ORAL
  Filled 2021-01-23: qty 2

## 2021-01-23 MED ORDER — CEFAZOLIN SODIUM-DEXTROSE 2-4 GM/100ML-% IV SOLN
INTRAVENOUS | Status: AC
  Filled 2021-01-23: qty 100

## 2021-01-23 MED ORDER — OXYCODONE HCL 5 MG PO TABS
5.0000 mg | ORAL_TABLET | Freq: Once | ORAL | Status: AC | PRN
  Administered 2021-01-23: 5 mg via ORAL

## 2021-01-23 MED ORDER — HEPARIN 6000 UNIT IRRIGATION SOLUTION
Status: DC | PRN
  Administered 2021-01-23: 1

## 2021-01-23 MED ORDER — HYDROMORPHONE HCL 1 MG/ML IJ SOLN: 0.2500 mg | INTRAMUSCULAR | Status: DC | PRN

## 2021-01-23 MED ORDER — MEPIVACAINE HCL (PF) 2 % IJ SOLN
INTRAMUSCULAR | Status: DC | PRN
  Administered 2021-01-23: 20 mL

## 2021-01-23 MED ORDER — ONDANSETRON HCL 4 MG/2ML IJ SOLN: 4.0000 mg | Freq: Once | INTRAMUSCULAR | Status: DC | PRN

## 2021-01-23 MED ORDER — HEPARIN 6000 UNIT IRRIGATION SOLUTION
Status: AC
  Filled 2021-01-23: qty 500

## 2021-01-23 MED ORDER — LIDOCAINE-EPINEPHRINE 1 %-1:100000 IJ SOLN
INTRAMUSCULAR | Status: DC | PRN
  Administered 2021-01-23: 5 mL

## 2021-01-23 MED ORDER — LIDOCAINE-EPINEPHRINE (PF) 1 %-1:200000 IJ SOLN
INTRAMUSCULAR | Status: AC
  Filled 2021-01-23: qty 30

## 2021-01-23 MED ORDER — PHENYLEPHRINE 40 MCG/ML (10ML) SYRINGE FOR IV PUSH (FOR BLOOD PRESSURE SUPPORT)
PREFILLED_SYRINGE | INTRAVENOUS | Status: AC
  Filled 2021-01-23: qty 10

## 2021-01-23 MED ORDER — FENTANYL CITRATE (PF) 100 MCG/2ML IJ SOLN
INTRAMUSCULAR | Status: AC
  Administered 2021-01-23: 50 ug
  Filled 2021-01-23: qty 2

## 2021-01-23 MED ORDER — MIDAZOLAM HCL 2 MG/2ML IJ SOLN
INTRAMUSCULAR | Status: AC
  Administered 2021-01-23: 1 mg
  Filled 2021-01-23: qty 2

## 2021-01-23 MED ORDER — CHLORHEXIDINE GLUCONATE 0.12 % MT SOLN
OROMUCOSAL | Status: AC
  Administered 2021-01-23: 15 mL via OROMUCOSAL
  Filled 2021-01-23: qty 15

## 2021-01-23 MED ORDER — ONDANSETRON HCL 4 MG/2ML IJ SOLN
INTRAMUSCULAR | Status: DC | PRN
  Administered 2021-01-23: 4 mg via INTRAVENOUS

## 2021-01-23 MED ORDER — CEFAZOLIN IN SODIUM CHLORIDE 3-0.9 GM/100ML-% IV SOLN: 3.0000 g | INTRAVENOUS | Status: DC

## 2021-01-23 MED ORDER — EPHEDRINE SULFATE-NACL 50-0.9 MG/10ML-% IV SOSY
PREFILLED_SYRINGE | INTRAVENOUS | Status: DC | PRN
Start: 2021-01-23 — End: 2021-01-23
  Administered 2021-01-23 (×3): 5 mg via INTRAVENOUS

## 2021-01-23 MED ORDER — CEFAZOLIN SODIUM-DEXTROSE 2-3 GM-%(50ML) IV SOLR
INTRAVENOUS | Status: DC | PRN
  Administered 2021-01-23: 2 g via INTRAVENOUS

## 2021-01-23 SURGICAL SUPPLY — 36 items
ARMBAND PINK RESTRICT EXTREMIT (MISCELLANEOUS) ×2 IMPLANT
BAG COUNTER SPONGE SURGICOUNT (BAG) ×2 IMPLANT
CANISTER SUCT 3000ML PPV (MISCELLANEOUS) ×2 IMPLANT
CLIP LIGATING EXTRA MED SLVR (CLIP) ×2 IMPLANT
CLIP LIGATING EXTRA SM BLUE (MISCELLANEOUS) ×2 IMPLANT
CLIP VESOCCLUDE MED 6/CT (CLIP) IMPLANT
CLIP VESOCCLUDE SM WIDE 24/CT (CLIP) IMPLANT
CLIP VESOCCLUDE SM WIDE 6/CT (CLIP) IMPLANT
COVER PROBE W GEL 5X96 (DRAPES) ×2 IMPLANT
DERMABOND ADHESIVE PROPEN (GAUZE/BANDAGES/DRESSINGS) ×1
DERMABOND ADVANCED (GAUZE/BANDAGES/DRESSINGS) ×1
DERMABOND ADVANCED .7 DNX12 (GAUZE/BANDAGES/DRESSINGS) ×1 IMPLANT
DERMABOND ADVANCED .7 DNX6 (GAUZE/BANDAGES/DRESSINGS) ×1 IMPLANT
ELECT REM PT RETURN 9FT ADLT (ELECTROSURGICAL) ×2
ELECTRODE REM PT RTRN 9FT ADLT (ELECTROSURGICAL) ×1 IMPLANT
GAUZE 4X4 16PLY ~~LOC~~+RFID DBL (SPONGE) ×2 IMPLANT
GLOVE SURG ENC MOIS LTX SZ7.5 (GLOVE) ×2 IMPLANT
GOWN STRL REUS W/ TWL LRG LVL3 (GOWN DISPOSABLE) ×4 IMPLANT
GOWN STRL REUS W/ TWL XL LVL3 (GOWN DISPOSABLE) ×1 IMPLANT
GOWN STRL REUS W/TWL LRG LVL3 (GOWN DISPOSABLE) ×4
GOWN STRL REUS W/TWL XL LVL3 (GOWN DISPOSABLE) ×1
KIT BASIN OR (CUSTOM PROCEDURE TRAY) ×2 IMPLANT
KIT TURNOVER KIT B (KITS) ×2 IMPLANT
NS IRRIG 1000ML POUR BTL (IV SOLUTION) ×2 IMPLANT
PACK CV ACCESS (CUSTOM PROCEDURE TRAY) ×2 IMPLANT
PAD ARMBOARD 7.5X6 YLW CONV (MISCELLANEOUS) ×4 IMPLANT
SPONGE T-LAP 18X18 ~~LOC~~+RFID (SPONGE) ×2 IMPLANT
SUT MNCRL AB 4-0 PS2 18 (SUTURE) ×4 IMPLANT
SUT PROLENE 5 0 C 1 24 (SUTURE) ×4 IMPLANT
SUT PROLENE 6 0 BV (SUTURE) ×2 IMPLANT
SUT SILK 2 0 SH (SUTURE) IMPLANT
SUT VIC AB 3-0 SH 27 (SUTURE) ×2
SUT VIC AB 3-0 SH 27X BRD (SUTURE) ×2 IMPLANT
TOWEL GREEN STERILE (TOWEL DISPOSABLE) ×2 IMPLANT
UNDERPAD 30X36 HEAVY ABSORB (UNDERPADS AND DIAPERS) ×2 IMPLANT
WATER STERILE IRR 1000ML POUR (IV SOLUTION) ×2 IMPLANT

## 2021-01-23 NOTE — Anesthesia Procedure Notes (Signed)
Procedure Name: LMA Insertion Date/Time: 01/23/2021 12:21 PM Performed by: Janene Harvey, CRNA Pre-anesthesia Checklist: Patient identified, Emergency Drugs available, Suction available and Patient being monitored Patient Re-evaluated:Patient Re-evaluated prior to induction Oxygen Delivery Method: Circle system utilized Preoxygenation: Pre-oxygenation with 100% oxygen Induction Type: IV induction LMA: LMA inserted LMA Size: 5.0 Placement Confirmation: positive ETCO2 Dental Injury: Teeth and Oropharynx as per pre-operative assessment

## 2021-01-23 NOTE — Progress Notes (Signed)
Spoke with Dr. Doroteo Glassman and Dr. Donzetta Matters about pt eating a bite of banana at 0430 this am. Per Dr. Doroteo Glassman, surgery needs to be delayed 6 hours after ingestion of banana. New surgery time will be 1030am. OR and Dr. Donzetta Matters notified.

## 2021-01-23 NOTE — Discharge Instructions (Signed)
° °  [facility] ° °Discharge Instructions ° °AV Fistula or Graft Surgery for Dialysis Access ° °Please refer to the following instructions for your post-procedure care. Your surgeon or physician assistant will discuss any changes with you. ° °Activity ° °You may drive the day following your surgery, if you are comfortable and no longer taking prescription pain medication. Resume full activity as the soreness in your incision resolves. ° °Bathing/Showering ° °You may shower after you go home. Keep your incision dry for 48 hours. Do not soak in a bathtub, hot tub, or swim until the incision heals completely. You may not shower if you have a hemodialysis catheter. ° °Incision Care ° °Clean your incision with mild soap and water after 48 hours. Pat the area dry with a clean towel. You do not need a bandage unless otherwise instructed. Do not apply any ointments or creams to your incision. You may have skin glue on your incision. Do not peel it off. It will come off on its own in about one week. Your arm may swell a bit after surgery. To reduce swelling use pillows to elevate your arm so it is above your heart. Your doctor will tell you if you need to lightly wrap your arm with an ACE bandage. ° °Diet ° °Resume your normal diet. There are not special food restrictions following this procedure. In order to heal from your surgery, it is CRITICAL to get adequate nutrition. Your body requires vitamins, minerals, and protein. Vegetables are the best source of vitamins and minerals. Vegetables also provide the perfect balance of protein. Processed food has little nutritional value, so try to avoid this. ° °Medications ° °Resume taking all of your medications. If your incision is causing pain, you may take over-the counter pain relievers such as acetaminophen (Tylenol). If you were prescribed a stronger pain medication, please be aware these medications can cause nausea and constipation. Prevent  nausea by taking the medication with a snack or meal. Avoid constipation by drinking plenty of fluids and eating foods with high amount of fiber, such as fruits, vegetables, and grains. Do not take Tylenol if you are taking prescription pain medications. ° ° ° ° °Follow up °Your surgeon may want to see you in the office following your access surgery. If so, this will be arranged at the time of your surgery. ° °Please call us immediately for any of the following conditions: ° °Increased pain, redness, drainage (pus) from your incision site °Fever of 101 degrees or higher °Severe or worsening pain at your incision site °Hand pain or numbness. ° °Reduce your risk of vascular disease: ° °Stop smoking. If you would like help, call [facility] at 1-800-QUIT-NOW (1-800-784-8669) or [place] at 336-586-4000 ° °Manage your cholesterol °Maintain a desired weight °Control your diabetes °Keep your blood pressure down ° °Dialysis ° °It will take several weeks to several months for your new dialysis access to be ready for use. Your surgeon will determine when it is OK to use it. Your nephrologist will continue to direct your dialysis. You can continue to use your Permcath until your new access is ready for use. ° °If you have any questions, please call the office at [phone]. ° °

## 2021-01-23 NOTE — Anesthesia Procedure Notes (Signed)
Anesthesia Regional Block: Supraclavicular block   Pre-Anesthetic Checklist: , timeout performed,  Correct Patient, Correct Site, Correct Laterality,  Correct Procedure, Correct Position, site marked,  Risks and benefits discussed,  Surgical consent,  Pre-op evaluation,  At surgeon's request and post-op pain management  Laterality: Left  Prep: Maximum Sterile Barrier Precautions used, chloraprep       Needles:  Injection technique: Single-shot  Needle Type: Echogenic Stimulator Needle     Needle Length: 9cm  Needle Gauge: 22     Additional Needles:   Procedures:,,,, ultrasound used (permanent image in chart),,    Narrative:  Start time: 01/23/2021 10:35 AM End time: 01/23/2021 10:40 AM Injection made incrementally with aspirations every 5 mL.  Performed by: Personally  Anesthesiologist: Pervis Hocking, DO  Additional Notes: Monitors applied. No increased pain on injection. No increased resistance to injection. Injection made in 5cc increments. Good needle visualization. Patient tolerated procedure well.

## 2021-01-23 NOTE — Transfer of Care (Signed)
Immediate Anesthesia Transfer of Care Note  Patient: Nathan Campbell  Procedure(s) Performed: Second Stage Basilic Vein Transposition of Left Arm Arteriovenous  Fistula (Left: Arm Upper)  Patient Location: PACU  Anesthesia Type:General and Regional  Level of Consciousness: awake and patient cooperative  Airway & Oxygen Therapy: Patient Spontanous Breathing and Patient connected to face mask oxygen  Post-op Assessment: Report given to RN and Post -op Vital signs reviewed and stable  Post vital signs: Reviewed and stable  Last Vitals:  Vitals Value Taken Time  BP 164/83 01/23/21 1301  Temp    Pulse 88 01/23/21 1305  Resp 32 01/23/21 1305  SpO2 99 % 01/23/21 1305  Vitals shown include unvalidated device data.  Last Pain:  Vitals:   01/23/21 0616  PainSc: 5       Patients Stated Pain Goal: 3 (41/96/22 2979)  Complications: No notable events documented.

## 2021-01-23 NOTE — Op Note (Signed)
    Patient name: Nathan Campbell MRN: [id number] DOB: 1976/01/06 Sex: male  01/23/2021 Pre-operative Diagnosis: Chronic kidney disease stage IV Post-operative diagnosis:  Same Surgeon:  Eda Paschal. Donzetta Matters, MD Assistant: Arlee Muslim, PA Procedure Performed: Revision of left arm basilic vein fistula with transposition   Indications: 45 year old male with chronic kidney disease has previously undergone a brachial artery to basilic vein fistula creation.  This has now dilated sufficiently he is indicated for transposition.  An assistant was necessary to facilitate exposure and expedite the case.  Findings: The basilic vein throughout the upper arm measured approximately 6 mm and in the axilla measured approximately 12 mm.  There was dense scar tissue around the brachial artery I did have to transect the vein a few centimeters distal to the previous anastomosis and placed a new anastomosis higher on the brachial artery.  At completion there was a 1+ radial pulse that was 3+ with compression of the fistula and there was a very strong flow in the fistula at baseline.  Both of these were confirmed with Doppler.   Procedure:  The patient was identified in the holding area and taken to the operating room where is placed supine operative table and initially MAC anesthesia was induced.  Patient had a preoperative block placed this was tested.  Throughout the case patient became disoriented with increased propofol he began to have desaturations and ultimately LMA anesthesia was induced.  Initially we used ultrasound we identified the fistula.  Given the dense scar tissue near the antecubitum I made an incision in the middle of the left upper arm.  We dissected down to the fistula.  We protected the vein.  This is densely adherent.  I did reopen his previous incision just above the antecubitum.  There was dense scar tissue there.  The fascia was actually injured and oversewn with 5-0 Prolene suture at that level.   At that time there was no further flow within the fistula.  We were able to dissected fully out but could not get back to the arterial anastomosis.  I dissected out the brachial artery higher up and placed a vessel loop around this.  A separate incision was made in the axilla.  We dissected out the fistula.  All branches were divided between clips and ties.  I ultimately elected to transect the fistula where it was previously injured.  I transected it there and tied it off.  I flushed with heparinized saline.  There is minimal clot.  I did remove but the fistula itself looked very nice.  I repaired one of the branches with a 6-0 Prolene suture towards the axilla.  I marked the vein for orientation and tunneled laterally.  I clamped the artery distally and proximally opened longitudinally flushed with heparinized saline distally only given its large size.  The vein was spatulated and sewn end-to-side with 5-0 Prolene suture.  Prior completion without flushing all directions.  Upon completion there was a very strong thrill in the fistula.  With compression there was a 3+ radial pulse that was 1+ without compression.  We confirmed both flows with Doppler.  We then irrigated the wounds and closed in layers with Vicryl and Monocryl.  Dermabond is placed at the skin level.  He was awakened from anesthesia having tolerated procedure well without immediate complication.  All counts were correct at completion.  EBL: 200cc  Matteus Mcnelly C. Donzetta Matters, MD [facility] Office: [phone] Pager: [phone]

## 2021-01-23 NOTE — H&P (Signed)
       History of Present Illness    Nathan Campbell is a 45 y.o. year old male who presents for postoperative follow-up for: left arm brachial artery to basilic vein fistula creation 11/11/20 with Dr. Donzetta Matters. The patient's wounds are well healed.  The patient notes no steal symptoms.  The patient is able to complete their activities of daily living.  The patient's current symptoms are: mild cramping in left hand intermittently. He is not currently on HD.   Physical Examination    Vitals:   01/23/21 0552  BP: (!) 152/91  Pulse: 86  Resp: 18  Temp: 98 F (36.7 C)  SpO2: 95%      left arm Incision is well healed, 2+ radial pulse, hand grip is 5/5, sensation in digits is intact, great palpable thrill, bruit can be auscultated     Non invasive vascular lab: 12/17/20 Findings:  +--------------------+----------+-----------------+--------+  AVF                 PSV (cm/s)Flow Vol (mL/min)Comments  +--------------------+----------+-----------------+--------+  Native artery inflow   211          2046                 +--------------------+----------+-----------------+--------+  AVF Anastomosis        580                               +--------------------+----------+-----------------+--------+      +------------+----------+-------------+----------+--------+  OUTFLOW VEINPSV (cm/s)Diameter (cm)Depth (cm)Describe  +------------+----------+-------------+----------+--------+  Prox UA        107        0.87        1.49             +------------+----------+-------------+----------+--------+  Mid UA         104        0.96        1.46             +------------+----------+-------------+----------+--------+  Dist UA        116        0.79        1.26             +------------+----------+-------------+----------+--------+  AC Fossa       218        0.86        1.40             +------------+----------+-------------+----------+--------+     Summary:   Patent arteriovenous fistula.    Medical Decision Making    Nathan Campbell is a 45 y.o. year old male who presents s/p left arm brachial artery to basilic vein fistula creation 11/11/20 with Dr. Donzetta Matters. Fistula is working well. He has no signs or symptoms of steal. Duplex shows patent AV Fistula that has matured nicely. Fistula is deep in the left upper arm. Discussed with patient need for AVF transposition/ elevation.    Shawn Dannenberg C. Donzetta Matters, MD [facility] Office: [phone] Pager: [phone]

## 2021-01-23 NOTE — Anesthesia Procedure Notes (Signed)
Procedure Name: MAC Date/Time: 01/23/2021 10:56 AM Performed by: Janene Harvey, CRNA Pre-anesthesia Checklist: Patient identified, Emergency Drugs available, Suction available and Patient being monitored Patient Re-evaluated:Patient Re-evaluated prior to induction Oxygen Delivery Method: Simple face mask Induction Type: IV induction Placement Confirmation: positive ETCO2 Dental Injury: Teeth and Oropharynx as per pre-operative assessment

## 2021-01-24 ENCOUNTER — Encounter (HOSPITAL_COMMUNITY): Payer: Self-pay | Admitting: Vascular Surgery

## 2021-01-24 NOTE — Anesthesia Postprocedure Evaluation (Signed)
Anesthesia Post Note  Patient: Nathan Campbell  Procedure(s) Performed: Second Stage Basilic Vein Transposition of Left Arm Arteriovenous  Fistula (Left: Arm Upper)     Patient location during evaluation: PACU Anesthesia Type: General Level of consciousness: awake Pain management: pain level controlled Vital Signs Assessment: post-procedure vital signs reviewed and stable Respiratory status: spontaneous breathing, nonlabored ventilation, respiratory function stable and patient connected to nasal cannula oxygen Cardiovascular status: blood pressure returned to baseline and stable Postop Assessment: no apparent nausea or vomiting Anesthetic complications: no   No notable events documented.  Last Vitals:  Vitals:   01/23/21 1315 01/23/21 1330  BP: (!) 144/89 (!) 144/86  Pulse: 85 84  Resp: 20 (!) 23  Temp:  36.5 C  SpO2: 100% 94%    Last Pain:  Vitals:   01/23/21 1340  PainSc: 4                  Analisse Randle P Jamiee Milholland

## 2021-01-29 ENCOUNTER — Ambulatory Visit (HOSPITAL_COMMUNITY)
Admission: RE | Admit: 2021-01-29 | Discharge: 2021-01-29 | Disposition: A | Discharge status: Home / Self Care | Payer: 59 | Source: Ambulatory Visit | Attending: Nephrology | Admitting: Nephrology

## 2021-01-29 ENCOUNTER — Other Ambulatory Visit: Payer: Self-pay

## 2021-01-29 VITALS — BP 135/87 | HR 88 | Resp 14

## 2021-01-29 DIAGNOSIS — D649 Anemia, unspecified: Secondary | ICD-10-CM | POA: Insufficient documentation

## 2021-01-29 DIAGNOSIS — N184 Chronic kidney disease, stage 4 (severe): Secondary | ICD-10-CM | POA: Diagnosis present

## 2021-01-29 LAB — POCT HEMOGLOBIN-HEMACUE: Hemoglobin: 7.8 g/dL — ABNORMAL LOW (ref 13.0–17.0)

## 2021-01-29 MED ORDER — EPOETIN ALFA-EPBX 10000 UNIT/ML IJ SOLN
INTRAMUSCULAR | Status: AC
  Filled 2021-01-29: qty 2

## 2021-01-29 MED ORDER — EPOETIN ALFA-EPBX 10000 UNIT/ML IJ SOLN
20000.0000 [IU] | INTRAMUSCULAR | Status: DC
  Administered 2021-01-29: 15:00:00 20000 [IU] via SUBCUTANEOUS

## 2021-01-29 NOTE — Progress Notes (Signed)
Spoke with Coralyn Mark with Dr Hollie Salk at [facility]. Informed them of pt's hemocue of 7.8 today. Pt had a hgb of 9.2 on the 10th of this month.  Orders given to continue plan as ordered.  No new orders or changes. Pt instructed to report any signs of bleeding.

## 2021-02-11 ENCOUNTER — Ambulatory Visit (INDEPENDENT_AMBULATORY_CARE_PROVIDER_SITE_OTHER): Payer: 59 | Admitting: Physician Assistant

## 2021-02-11 ENCOUNTER — Other Ambulatory Visit: Payer: Self-pay

## 2021-02-11 ENCOUNTER — Encounter: Payer: Self-pay | Admitting: Vascular Surgery

## 2021-02-11 VITALS — BP 128/80 | HR 89 | Temp 98.2°F | Resp 20 | Ht 72.0 in | Wt 310.8 lb

## 2021-02-11 DIAGNOSIS — N184 Chronic kidney disease, stage 4 (severe): Secondary | ICD-10-CM

## 2021-02-11 NOTE — Progress Notes (Signed)
  POST OPERATIVE DIALYSIS ACCESS OFFICE NOTE    CC:  F/u for dialysis access surgery  HPI:  This is a 45 y.o. male who is s/p left first stage basilic vein fistula creation by Dr. Donzetta Matters on November 11, 2020.  He underwent second stage basilic vein transposition on January 23, 2021.  He denies hand pain or numbness.  He is not yet requiring hemodialysis. He denies hand pain or numbness.  No Known Allergies  Current Outpatient Medications  Medication Sig Dispense Refill   allopurinol (ZYLOPRIM) 100 MG tablet Take 1 tablet (100 mg total) by mouth daily. 30 tablet 1   amLODipine (NORVASC) 10 MG tablet Take 1 tablet (10 mg total) by mouth daily. 30 tablet 1   bacitracin-neomycin-polymyxin-hydrocortisone (CORTISPORIN) 1 % ophthalmic ointment SMARTSIG:In Eye(s)     calcitRIOL (ROCALTROL) 0.5 MCG capsule Take 0.5 mcg by mouth daily.     calcium carbonate (TUMS - DOSED IN MG ELEMENTAL CALCIUM) 500 MG chewable tablet Chew 1 tablet by mouth 3 (three) times daily with meals.     carvedilol (COREG) 25 MG tablet Take 1.5 tablets (37.5 mg total) by mouth 2 (two) times daily with a meal. (Patient taking differently: Take 25 mg by mouth 2 (two) times daily with a meal.) 90 tablet 11   doxazosin (CARDURA) 8 MG tablet Take 8 mg by mouth at bedtime.  5   hydrALAZINE (APRESOLINE) 100 MG tablet Take 1 tablet (100 mg total) by mouth every 8 (eight) hours. 90 tablet 1   HYDROcodone-acetaminophen (NORCO) 5-325 MG tablet Take 1 tablet by mouth every 6 (six) hours as needed for moderate pain. 20 tablet 0   isosorbide mononitrate (IMDUR) 60 MG 24 hr tablet Take 1 tablet (60 mg total) by mouth daily. 30 tablet 1   predniSONE (DELTASONE) 20 MG tablet Take 1.5 tablets (30 mg total) by mouth daily with breakfast. 5 tablet 0   torsemide (DEMADEX) 20 MG tablet Take 60 mg by mouth 2 (two) times daily.     No current facility-administered medications for this visit.     ROS:  See HPI  Resp 20   Ht 6' (1.829 m)   Wt (!)  310 lb 12.8 oz (141 kg)   BMI 42.15 kg/m    Physical Exam:  General appearance: awake, alert in NAD Respirations: unlabored; no dyspnea at rest Left upper extremity: Hand is warm with 5/5 grip strength. Motor function and sensation intact. Good bruit and thrill in fistula. 2+ radial pulse. Incision(s): Well approximated. No active bleeding or hematoma.   Assessment/Plan:   -pt does not have evidence of steal syndrome -dialysis duplex today reveals fistula to be of adequate diameter and depth for access -the fistula/graft may be used starting immediately  Barbie Banner, PA-C 02/11/2021 3:35 PM [facility] [phone]  Clinic MD:  Donzetta Matters

## 2021-02-26 ENCOUNTER — Encounter (HOSPITAL_COMMUNITY): Payer: Self-pay

## 2021-02-26 ENCOUNTER — Other Ambulatory Visit: Payer: Self-pay

## 2021-02-26 ENCOUNTER — Encounter (HOSPITAL_COMMUNITY): Payer: 59

## 2021-02-26 ENCOUNTER — Ambulatory Visit (HOSPITAL_COMMUNITY): Admission: RE | Admit: 2021-02-26 | Payer: 59 | Source: Ambulatory Visit

## 2021-03-12 ENCOUNTER — Encounter (HOSPITAL_COMMUNITY): Payer: Self-pay

## 2021-03-18 ENCOUNTER — Other Ambulatory Visit: Payer: Self-pay

## 2021-03-18 ENCOUNTER — Ambulatory Visit (HOSPITAL_COMMUNITY)
Admission: RE | Admit: 2021-03-18 | Discharge: 2021-03-18 | Disposition: A | Discharge status: Home / Self Care | Payer: Self-pay | Source: Ambulatory Visit | Attending: Nephrology | Admitting: Nephrology

## 2021-03-18 VITALS — BP 148/90 | HR 90 | Temp 97.8°F

## 2021-03-18 DIAGNOSIS — D649 Anemia, unspecified: Secondary | ICD-10-CM | POA: Insufficient documentation

## 2021-03-18 DIAGNOSIS — N184 Chronic kidney disease, stage 4 (severe): Secondary | ICD-10-CM | POA: Insufficient documentation

## 2021-03-18 LAB — IRON AND TIBC
Iron: 63 ug/dL (ref 45–182)
Saturation Ratios: 22 % (ref 17.9–39.5)
TIBC: 284 ug/dL (ref 250–450)
UIBC: 221 ug/dL

## 2021-03-18 LAB — FERRITIN: Ferritin: 171 ng/mL (ref 24–336)

## 2021-03-18 LAB — POCT HEMOGLOBIN-HEMACUE: Hemoglobin: 9.9 g/dL — ABNORMAL LOW (ref 13.0–17.0)

## 2021-03-18 MED ORDER — EPOETIN ALFA-EPBX 10000 UNIT/ML IJ SOLN
INTRAMUSCULAR | Status: AC
  Filled 2021-03-18: qty 2

## 2021-03-18 MED ORDER — EPOETIN ALFA-EPBX 10000 UNIT/ML IJ SOLN
20000.0000 [IU] | INTRAMUSCULAR | Status: DC
  Administered 2021-03-18: 20000 [IU] via SUBCUTANEOUS

## 2021-03-20 DIAGNOSIS — D509 Iron deficiency anemia, unspecified: Secondary | ICD-10-CM | POA: Insufficient documentation

## 2021-03-20 DIAGNOSIS — E785 Hyperlipidemia, unspecified: Secondary | ICD-10-CM | POA: Diagnosis present

## 2021-03-26 ENCOUNTER — Encounter (HOSPITAL_COMMUNITY): Payer: 59

## 2021-04-15 ENCOUNTER — Encounter (HOSPITAL_COMMUNITY)
Admission: RE | Admit: 2021-04-15 | Discharge: 2021-04-15 | Disposition: A | Discharge status: Home / Self Care | Payer: Self-pay | Source: Ambulatory Visit | Attending: Nephrology | Admitting: Nephrology

## 2021-04-15 VITALS — BP 159/102 | HR 94 | Temp 97.6°F | Resp 20

## 2021-04-15 DIAGNOSIS — N184 Chronic kidney disease, stage 4 (severe): Secondary | ICD-10-CM | POA: Insufficient documentation

## 2021-04-15 DIAGNOSIS — D649 Anemia, unspecified: Secondary | ICD-10-CM | POA: Insufficient documentation

## 2021-04-15 LAB — IRON AND TIBC
Iron: 47 ug/dL (ref 45–182)
Saturation Ratios: 15 % — ABNORMAL LOW (ref 17.9–39.5)
TIBC: 304 ug/dL (ref 250–450)
UIBC: 257 ug/dL

## 2021-04-15 LAB — FERRITIN: Ferritin: 195 ng/mL (ref 24–336)

## 2021-04-15 LAB — POCT HEMOGLOBIN-HEMACUE: Hemoglobin: 9.3 g/dL — ABNORMAL LOW (ref 13.0–17.0)

## 2021-04-15 MED ORDER — EPOETIN ALFA-EPBX 10000 UNIT/ML IJ SOLN
INTRAMUSCULAR | Status: AC
  Filled 2021-04-15: qty 2

## 2021-04-15 MED ORDER — EPOETIN ALFA-EPBX 10000 UNIT/ML IJ SOLN
20000.0000 [IU] | INTRAMUSCULAR | Status: DC
  Administered 2021-04-15: 20000 [IU] via SUBCUTANEOUS

## 2021-05-13 ENCOUNTER — Encounter (HOSPITAL_COMMUNITY): Payer: Self-pay

## 2021-05-13 ENCOUNTER — Inpatient Hospital Stay (HOSPITAL_COMMUNITY): Admission: RE | Admit: 2021-05-13 | Payer: Self-pay | Source: Ambulatory Visit

## 2021-05-29 ENCOUNTER — Encounter (HOSPITAL_COMMUNITY): Payer: Self-pay

## 2021-06-10 ENCOUNTER — Encounter (HOSPITAL_COMMUNITY): Payer: Self-pay

## 2021-06-11 ENCOUNTER — Inpatient Hospital Stay (HOSPITAL_COMMUNITY): Admission: RE | Admit: 2021-06-11 | Payer: Self-pay | Source: Ambulatory Visit

## 2021-09-13 ENCOUNTER — Ambulatory Visit (INDEPENDENT_AMBULATORY_CARE_PROVIDER_SITE_OTHER): Payer: 59

## 2021-09-13 ENCOUNTER — Ambulatory Visit
Admission: EM | Admit: 2021-09-13 | Discharge: 2021-09-13 | Disposition: A | Discharge status: Home / Self Care | Payer: 59 | Attending: Emergency Medicine | Admitting: Emergency Medicine

## 2021-09-13 ENCOUNTER — Encounter (HOSPITAL_COMMUNITY): Payer: Self-pay

## 2021-09-13 DIAGNOSIS — M1711 Unilateral primary osteoarthritis, right knee: Secondary | ICD-10-CM | POA: Diagnosis not present

## 2021-09-13 DIAGNOSIS — M25561 Pain in right knee: Secondary | ICD-10-CM | POA: Diagnosis not present

## 2021-09-13 DIAGNOSIS — Z8739 Personal history of other diseases of the musculoskeletal system and connective tissue: Secondary | ICD-10-CM

## 2021-09-13 MED ORDER — METHYLPREDNISOLONE 8 MG PO TABS: ORAL_TABLET | ORAL | 0 refills | Status: AC

## 2021-09-13 MED ORDER — ALLOPURINOL 100 MG PO TABS: 100.0000 mg | ORAL_TABLET | Freq: Every day | ORAL | 1 refills | Status: DC

## 2021-09-13 MED ORDER — DICLOFENAC SODIUM 1 % EX GEL: 4.0000 g | Freq: Four times a day (QID) | CUTANEOUS | 2 refills | Status: DC

## 2021-09-13 NOTE — Discharge Instructions (Signed)
The x-ray of your right knee is concerning for arthritis.  When compared to the x-ray performed in February 2022, you have loss of joint space which is a common finding and arthritis.  I recommend that you begin an 8-day course of steroids to help reduce inflammation and pain.  You are also welcome to apply topical Voltaren gel and ice 4 times daily as needed to help reduce inflammation.  Because of your history of gout, it is also recommended that we repeat a uric acid level blood test today.  The result of this test should be available in the next day or 2.  If the level is normal, please resume allopurinol, which I have refilled for you, and if it is not normal, please do not resume allopurinol until your pain is completely resolved.  If your pain is not completely resolved after finishing 8 days of steroids, please follow-up with your primary care provider for further evaluation and possible treatment.

## 2021-09-13 NOTE — ED Provider Notes (Addendum)
[facility]    CSN: [id number] Arrival date & time: 09/13/21  0840    HISTORY   Chief Complaint  Patient presents with   Leg Pain   HPI Nathan Campbell is a 46 y.o. male. Pt c/o right knee pain, and the patient is ambulatory.  Patient has a history of gout in his right knee.  His pain began yesterday after kneeling on a concrete floor on his right knee.  Patient states he is currently on dialysis.  Patient states he has not tried anything to relieve his pain.  Patient states he has been prescribed allopurinol in the past but is not currently taking it due to prescription running out.  X-ray right knee today revealed some mild degenerative changes that did not appear present on right knee x-ray 16 months ago.  The history is provided by the patient.   Past Medical History:  Diagnosis Date   Acute combined systolic and diastolic CHF, NYHA class 4 (HCC) 06/10/2016   Cardiomyopathy ([place]) 06/14/2016   CHF (congestive heart failure) ([place])    CKD (chronic kidney disease)    COVID 10/2020   Dyspnea    Hypertension    Hypertensive heart and kidney disease with heart failure ([place]) 06/14/2016   Hypertensive heart disease with congestive heart failure ([place])    Obesity    Patient Active Problem List   Diagnosis Date Noted   Change in bowel habits 08/13/2020   Constipation 08/13/2020   Acute bilateral low back pain without sciatica 07/04/2019   Acute gout due to renal impairment involving left wrist 04/24/2019   Malignant hypertension 10/14/2016   Hypertensive heart disease with CHF (congestive heart failure) ([place]) 06/14/2016   NICM (nonischemic cardiomyopathy) ([place]) 06/14/2016   Renal failure    Hypertensive urgency 06/10/2016   CKD (chronic kidney disease), stage IV ([place]) 06/10/2016   Normocytic anemia 06/10/2016   Past Surgical History:  Procedure Laterality Date   AV FISTULA PLACEMENT Left 11/11/2020   Procedure: LEFT ARM First Stage Basilic Vein Fistula Creation;   Surgeon: Waynetta Sandy, MD;  Location: [place];  Service: Vascular;  Laterality: Left;   [place] Left 01/23/2021   Procedure: Second Stage Basilic Vein Transposition of Left Arm Arteriovenous  Fistula;  Surgeon: Waynetta Sandy, MD;  [facility];  Service: Vascular;  Laterality: Left;  Block  to LMA    COLONOSCOPY     NO PAST SURGERIES      Home Medications    Prior to Admission medications   Medication Sig Start Date End Date Taking? Authorizing Provider  allopurinol (ZYLOPRIM) 100 MG tablet Take 1 tablet (100 mg total) by mouth daily. 05/10/19   Rosemarie Ax, MD  amLODipine (NORVASC) 10 MG tablet Take 1 tablet (10 mg total) by mouth daily. 06/16/16   Verlee Monte, MD  bacitracin-neomycin-polymyxin-hydrocortisone (CORTISPORIN) 1 % ophthalmic ointment SMARTSIG:In Eye(s) 12/23/20   [provider]  calcitRIOL (ROCALTROL) 0.5 MCG capsule Take 0.5 mcg by mouth daily. 08/26/20   [provider]  calcium carbonate (TUMS - DOSED IN MG ELEMENTAL CALCIUM) 500 MG chewable tablet Chew 1 tablet by mouth 3 (three) times daily with meals.    [provider]  carvedilol (COREG) 25 MG tablet Take 1.5 tablets (37.5 mg total) by mouth 2 (two) times daily with a meal. Patient taking differently: Take 25 mg by mouth 2 (two) times daily with a meal. 10/11/16   Croitoru, Mihai, MD  doxazosin (CARDURA) 8 MG tablet Take  8 mg by mouth at bedtime. 11/03/17   [provider]  hydrALAZINE (APRESOLINE) 100 MG tablet Take 1 tablet (100 mg total) by mouth every 8 (eight) hours. 06/15/16   Verlee Monte, MD  HYDROcodone-acetaminophen (NORCO) 5-325 MG tablet Take 1 tablet by mouth every 6 (six) hours as needed for moderate pain. 01/23/21   Dagoberto Ligas, PA-C  isosorbide mononitrate (IMDUR) 60 MG 24 hr tablet Take 1 tablet (60 mg total) by mouth daily. 06/16/16   Verlee Monte, MD  predniSONE (DELTASONE) 20 MG tablet Take 1.5 tablets (30 mg total)  by mouth daily with breakfast. 10/22/20   Marney Setting, NP  torsemide (DEMADEX) 20 MG tablet Take 60 mg by mouth 2 (two) times daily. 10/08/20   [provider]    Family History Family History  Problem Relation Age of Onset   Hypertension Mother    Diabetes Father    Hypertension Father    Hypertension Sister    Hypertension Paternal Grandmother    Diabetes Mellitus II Paternal Grandfather    Heart disease Cousin    Heart disease Cousin    Colon cancer Cousin    Esophageal cancer Neg Hx    Rectal cancer Neg Hx    Stomach cancer Neg Hx    Social History Social History   Tobacco Use   Smoking status: Former    Packs/day: 0.00    Years: 5.00    Total pack years: 0.00    Types: Cigarettes    Quit date: 09/2020    Years since quitting: 1.0   Smokeless tobacco: Never   Tobacco comments:    occasionally  Vaping Use   Vaping Use: Former  Substance Use Topics   Alcohol use: Not Currently    Comment: socially   Drug use: Not Currently    Types: Marijuana   Allergies   Patient has no known allergies.  Review of Systems Review of Systems Pertinent findings noted in history of present illness.   Physical Exam Triage Vital Signs ED Triage Vitals  Enc Vitals Group     BP 01/09/21 0827 (!) 147/82     Pulse Rate 01/09/21 0827 72     Resp 01/09/21 0827 18     Temp 01/09/21 0827 98.3 F (36.8 C)     Temp Source 01/09/21 0827 Oral     SpO2 01/09/21 0827 98 %     Weight --      Height --      Head Circumference --      Peak Flow --      Pain Score 01/09/21 0826 5     Pain Loc --      Pain Edu? --      Excl. in [place]? --   No data found.  Updated Vital Signs BP 101/68 (BP Location: Right Arm)   Pulse 76   Temp 98.3 F (36.8 C) (Oral)   SpO2 93%   Physical Exam Vitals and nursing note reviewed.  Constitutional:      General: He is not in acute distress.    Appearance: Normal appearance. He is normal weight. He is not ill-appearing.  HENT:      Head: Normocephalic and atraumatic.  Eyes:     Extraocular Movements: Extraocular movements intact.     Conjunctiva/sclera: Conjunctivae normal.     Pupils: Pupils are equal, round, and reactive to light.  Cardiovascular:     Rate and Rhythm: Normal rate and regular rhythm.  Pulmonary:  Effort: Pulmonary effort is normal.     Breath sounds: Normal breath sounds.  Musculoskeletal:        General: Tenderness (Lateral aspect of knee joint line) present. No swelling, deformity or signs of injury. Normal range of motion.     Cervical back: Normal range of motion and neck supple.     Right lower leg: Edema present.     Left lower leg: Edema present.  Skin:    General: Skin is warm and dry.  Neurological:     General: No focal deficit present.     Mental Status: He is alert and oriented to person, place, and time. Mental status is at baseline.  Psychiatric:        Mood and Affect: Mood normal.        Behavior: Behavior normal.        Thought Content: Thought content normal.        Judgment: Judgment normal.     Visual Acuity Right Eye Distance:   Left Eye Distance:   Bilateral Distance:    Right Eye Near:   Left Eye Near:    Bilateral Near:     UC Couse / Diagnostics / Procedures:    EKG  Radiology DG Knee AP/LAT W/[place] Right  Result Date: 09/13/2021 CLINICAL DATA:  Acute RIGHT knee pain after kneeling on concrete floor yesterday EXAM: RIGHT KNEE 3 VIEWS COMPARISON:  None FINDINGS: Clothing artifacts. Osseous mineralization normal. Diffuse joint space narrowing. No acute fracture, dislocation, or bone destruction. Trace joint effusion. IMPRESSION: Mild degenerative changes and trace joint effusion RIGHT knee. No acute osseous abnormalities. Electronically Signed   By: Lavonia Dana M.D.   On: 09/13/2021 10:46    Procedures Procedures (including critical care time)  UC Diagnoses / Final Clinical Impressions(s)   I have reviewed the triage vital signs and the nursing  notes.  Pertinent labs & imaging results that were available during my care of the patient were reviewed by me and considered in my medical decision making (see chart for details).    Final diagnoses:  Primary osteoarthritis of right knee  Acute pain of right knee  History of gout   Uric acid level checked today, patient advised to resume allopurinol once his knee pain is resolved, allopurinol renewed as a courtesy.  Patient also provided with a prescription for methylprednisolone for relief of pain which should treat him for both gout and inflammation of some mild degenerative arthritis.  Patient advised to follow-up with primary care provider regarding both issues. ED Prescriptions     Medication Sig Dispense Auth. Provider   methylPREDNISolone (MEDROL) 8 MG tablet Take 4 tablets (32 mg total) by mouth daily for 2 days, THEN 3 tablets (24 mg total) daily for 2 days, THEN 2 tablets (16 mg total) daily for 2 days, THEN 1 tablet (8 mg total) daily for 2 days. 20 tablet Lynden Oxford Scales, PA-C   allopurinol (ZYLOPRIM) 100 MG tablet Take 1 tablet (100 mg total) by mouth daily. Do not begin until pain in right knee is resolved. 30 tablet Lynden Oxford Scales, PA-C   diclofenac Sodium (VOLTAREN) 1 % GEL Apply 4 g topically 4 (four) times daily. Apply to affected areas 4 times daily as needed for pain. 100 g Lynden Oxford Scales, PA-C      PDMP not reviewed this encounter.  Pending results:  Labs Reviewed  URIC ACID    Medications Ordered in UC: Medications - No data to display  Disposition Upon  Discharge:  Condition: stable for discharge home Home: take medications as prescribed; routine discharge instructions as discussed; follow up as advised.  Patient presented with an acute illness with associated systemic symptoms and significant discomfort requiring urgent management. In my opinion, this is a condition that a prudent lay person (someone who possesses an average knowledge of  health and medicine) may potentially expect to result in complications if not addressed urgently such as respiratory distress, impairment of bodily function or dysfunction of bodily organs.   Routine symptom specific, illness specific and/or disease specific instructions were discussed with the patient and/or caregiver at length.   As such, the patient has been evaluated and assessed, work-up was performed and treatment was provided in alignment with urgent care protocols and evidence based medicine.  Patient/parent/caregiver has been advised that the patient may require follow up for further testing and treatment if the symptoms continue in spite of treatment, as clinically indicated and appropriate.  Patient/parent/caregiver has been advised to return to the [facility] or PCP if no better; to PCP or the Emergency Department if new signs and symptoms develop, or if the current signs or symptoms continue to change or worsen for further workup, evaluation and treatment as clinically indicated and appropriate  The patient will follow up with their current PCP if and as advised. If the patient does not currently have a PCP we will assist them in obtaining one.   The patient may need specialty follow up if the symptoms continue, in spite of conservative treatment and management, for further workup, evaluation, consultation and treatment as clinically indicated and appropriate.   Patient/parent/caregiver verbalized understanding and agreement of plan as discussed.  All questions were addressed during visit.  Please see discharge instructions below for further details of plan.  Discharge Instructions:   Discharge Instructions      The x-ray of your right knee is concerning for arthritis.  When compared to the x-ray performed in February 2022, you have loss of joint space which is a common finding and arthritis.  I recommend that you begin an 8-day course of steroids to help reduce inflammation and pain.  You  are also welcome to apply topical Voltaren gel and ice 4 times daily as needed to help reduce inflammation.  Because of your history of gout, it is also recommended that we repeat a uric acid level blood test today.  The result of this test should be available in the next day or 2.  If the level is normal, please resume allopurinol, which I have refilled for you, and if it is not normal, please do not resume allopurinol until your pain is completely resolved.  If your pain is not completely resolved after finishing 8 days of steroids, please follow-up with your primary care provider for further evaluation and possible treatment.      This office note has been dictated using Museum/gallery curator.  Unfortunately, and despite my best efforts, this method of dictation can sometimes lead to occasional typographical or grammatical errors.  I apologize in advance if this occurs.     Lynden Oxford Scales, PA-C 09/13/21 1237    Lynden Oxford Scales, PA-C 09/13/21 1239

## 2021-09-13 NOTE — ED Triage Notes (Signed)
Pt c/o right knee pain, and the patient is ambulatory.  Started: yesterday  Home interventions: none

## 2021-09-15 LAB — URIC ACID: Uric Acid: 4.5 mg/dL (ref 3.8–8.4)

## 2021-10-14 ENCOUNTER — Other Ambulatory Visit: Payer: Self-pay | Admitting: *Deleted

## 2021-10-14 DIAGNOSIS — M79603 Pain in arm, unspecified: Secondary | ICD-10-CM

## 2021-10-14 DIAGNOSIS — N184 Chronic kidney disease, stage 4 (severe): Secondary | ICD-10-CM

## 2021-10-19 ENCOUNTER — Encounter: Payer: Self-pay | Admitting: Surgery

## 2021-10-19 ENCOUNTER — Encounter (HOSPITAL_COMMUNITY): Payer: Self-pay

## 2021-10-19 ENCOUNTER — Ambulatory Visit (HOSPITAL_COMMUNITY)
Admission: RE | Admit: 2021-10-19 | Discharge: 2021-10-19 | Disposition: A | Discharge status: Home / Self Care | Payer: Medicare Other | Source: Ambulatory Visit | Attending: Surgery | Admitting: Surgery

## 2021-10-19 ENCOUNTER — Ambulatory Visit (INDEPENDENT_AMBULATORY_CARE_PROVIDER_SITE_OTHER): Payer: Medicare Other | Admitting: Surgery

## 2021-10-19 VITALS — BP 135/85 | HR 89 | Temp 98.4°F | Resp 20 | Ht 72.0 in | Wt 315.0 lb

## 2021-10-19 DIAGNOSIS — N186 End stage renal disease: Secondary | ICD-10-CM

## 2021-10-19 DIAGNOSIS — Z992 Dependence on renal dialysis: Secondary | ICD-10-CM | POA: Diagnosis not present

## 2021-10-19 DIAGNOSIS — M79603 Pain in arm, unspecified: Secondary | ICD-10-CM | POA: Diagnosis present

## 2021-10-19 DIAGNOSIS — N184 Chronic kidney disease, stage 4 (severe): Secondary | ICD-10-CM | POA: Insufficient documentation

## 2021-10-19 NOTE — Progress Notes (Signed)
[facility]  Patient name: Nathan Campbell MRN: [id number] DOB: 1975-08-29 Sex: male   REASON FOR VISIT:    Follow up  [place]:    Nathan Campbell is a 46 y.o. male who underwent a left arm brachiobasilic fistula on 2/68/3419 and then had a revision on 01/23/2021 by Dr. Donzetta Matters.  He initially had pain in his hand, however this has now resolved   PAST MEDICAL HISTORY:   Past Medical History:  Diagnosis Date   Acute combined systolic and diastolic CHF, NYHA class 4 ([place]) 06/10/2016   Cardiomyopathy ([place]) 06/14/2016   CHF (congestive heart failure) (HCC)    CKD (chronic kidney disease)    COVID 10/2020   Dyspnea    Hypertension    Hypertensive heart and kidney disease with heart failure ([place]) 06/14/2016   Hypertensive heart disease with congestive heart failure ([place])    Obesity      FAMILY HISTORY:   Family History  Problem Relation Age of Onset   Hypertension Mother    Diabetes Father    Hypertension Father    Hypertension Sister    Hypertension Paternal Grandmother    Diabetes Mellitus II Paternal Grandfather    Heart disease Cousin    Heart disease Cousin    Colon cancer Cousin    Esophageal cancer Neg Hx    Rectal cancer Neg Hx    Stomach cancer Neg Hx     SOCIAL HISTORY:   Social History   Tobacco Use   Smoking status: Former    Packs/day: 0.00    Years: 5.00    Total pack years: 0.00    Types: Cigarettes    Quit date: 09/2020    Years since quitting: 1.1   Smokeless tobacco: Never   Tobacco comments:    occasionally  Substance Use Topics   Alcohol use: Not Currently    Comment: socially     ALLERGIES:   No Known Allergies   CURRENT MEDICATIONS:   Current Outpatient Medications  Medication Sig Dispense Refill   allopurinol (ZYLOPRIM) 100 MG tablet Take 1 tablet (100 mg total) by mouth daily. Do not begin until pain in right knee is resolved. 30 tablet 1    amLODipine (NORVASC) 10 MG tablet Take 1 tablet (10 mg total) by mouth daily. 30 tablet 1   bacitracin-neomycin-polymyxin-hydrocortisone (CORTISPORIN) 1 % ophthalmic ointment SMARTSIG:In Eye(s)     calcitRIOL (ROCALTROL) 0.5 MCG capsule Take 0.5 mcg by mouth daily.     calcium carbonate (TUMS - DOSED IN MG ELEMENTAL CALCIUM) 500 MG chewable tablet Chew 1 tablet by mouth 3 (three) times daily with meals.     diclofenac Sodium (VOLTAREN) 1 % GEL Apply 4 g topically 4 (four) times daily. Apply to affected areas 4 times daily as needed for pain. 100 g 2   doxazosin (CARDURA) 8 MG tablet Take 8 mg by mouth at bedtime.  5   isosorbide mononitrate (IMDUR) 60 MG 24 hr tablet Take 1 tablet (60 mg total) by mouth daily. 30 tablet 1   torsemide (DEMADEX) 20 MG tablet Take 60 mg by mouth 2 (two) times daily.     No current facility-administered medications for this visit.    REVIEW OF SYSTEMS:   '[X]'$  denotes positive finding, '[ ]'$  denotes negative finding Cardiac  Comments:  Chest pain or chest pressure:    Shortness of breath upon exertion:    Short of breath when lying flat:    Irregular  heart rhythm:        Vascular    Pain in calf, thigh, or hip brought on by ambulation:    Pain in feet at night that wakes you up from your sleep:     Blood clot in your veins:    Leg swelling:         Pulmonary    Oxygen at home:    Productive cough:     Wheezing:         Neurologic    Sudden weakness in arms or legs:     Sudden numbness in arms or legs:     Sudden onset of difficulty speaking or slurred speech:    Temporary loss of vision in one eye:     Problems with dizziness:         Gastrointestinal    Blood in stool:     Vomited blood:         Genitourinary    Burning when urinating:     Blood in urine:        Psychiatric    Major depression:         Hematologic    Bleeding problems:    Problems with blood clotting too easily:        Skin    Rashes or ulcers:         Constitutional    Fever or chills:      PHYSICAL EXAM:   Vitals:   10/19/21 1023  BP: 135/85  Pulse: 89  Resp: 20  Temp: 98.4 F (36.9 C)  SpO2: 97%  Weight: (!) 315 lb (142.9 kg)  Height: 6' (1.829 m)    GENERAL: The patient is a well-nourished male, in no acute distress. The vital signs are documented above. CARDIAC: There is a regular rate and rhythm.  VASCULAR: Palpable thrill within left basilic vein fistula.  Palpable left radial pulse PULMONARY: Non-labored respirations MUSCULOSKELETAL: There are no major deformities or cyanosis. NEUROLOGIC: No focal weakness or paresthesias are detected. SKIN: There are no ulcers or rashes noted. PSYCHIATRIC: The patient has a normal affect.  STUDIES:   I have reviewed the following:  Pressure and waveform morphology improves with fistula compression  suggesting  with steal syndrome.   MEDICAL ISSUES:  ESRD: The patient has no signs of steal currently.  His fistula is working well.  He no longer has a catheter in place.  He will follow-up on an as-needed basis    Leia Alf, MD, FACS [facility] [phone] Pager [phone]

## 2021-11-11 ENCOUNTER — Ambulatory Visit: Payer: 59 | Admitting: Vascular Surgery

## 2021-11-11 ENCOUNTER — Other Ambulatory Visit (HOSPITAL_COMMUNITY): Payer: 59

## 2022-01-11 ENCOUNTER — Encounter (HOSPITAL_COMMUNITY): Payer: Self-pay

## 2022-01-12 ENCOUNTER — Encounter (HOSPITAL_COMMUNITY): Payer: Self-pay

## 2022-03-25 ENCOUNTER — Encounter (HOSPITAL_COMMUNITY): Payer: Self-pay

## 2022-03-25 ENCOUNTER — Ambulatory Visit
Admission: EM | Admit: 2022-03-25 | Discharge: 2022-03-25 | Disposition: A | Discharge status: Home / Self Care | Payer: Medicare Other | Attending: Urgent Care | Admitting: Urgent Care

## 2022-03-25 DIAGNOSIS — Z992 Dependence on renal dialysis: Secondary | ICD-10-CM

## 2022-03-25 DIAGNOSIS — L089 Local infection of the skin and subcutaneous tissue, unspecified: Secondary | ICD-10-CM | POA: Diagnosis not present

## 2022-03-25 DIAGNOSIS — M79642 Pain in left hand: Secondary | ICD-10-CM

## 2022-03-25 DIAGNOSIS — N186 End stage renal disease: Secondary | ICD-10-CM | POA: Diagnosis not present

## 2022-03-25 DIAGNOSIS — S61432A Puncture wound without foreign body of left hand, initial encounter: Secondary | ICD-10-CM

## 2022-03-25 MED ORDER — DOXYCYCLINE HYCLATE 100 MG PO CAPS: 100.0000 mg | ORAL_CAPSULE | Freq: Two times a day (BID) | ORAL | 0 refills | Status: DC

## 2022-03-25 NOTE — ED Provider Notes (Signed)
[name]  Note:  This document was prepared using Systems analyst and may include unintentional dictation errors.  MRN: [id number] DOB: 11-06-1975  Subjective:   Nathan Campbell is a 47 y.o. male presenting for 2 week history of acute onset persistent and worsening left hand pain over the palmar surface.  He suffered a wooden splinter from a wooden crate about 3 months ago.  He believes he got some of it out but is not sure if there is retained foreign body.  Got his tdap in November 2023.  Does not believe that he had pain until the past 2 weeks.  No spontaneous drainage of pus or bleeding.  Patient is on dialysis for chronic kidney disease likely related to hypertension.  No current facility-administered medications for this encounter.  Current Outpatient Medications:    allopurinol (ZYLOPRIM) 100 MG tablet, Take 1 tablet (100 mg total) by mouth daily. Do not begin until pain in right knee is resolved., Disp: 30 tablet, Rfl: 1   amLODipine (NORVASC) 10 MG tablet, Take 1 tablet (10 mg total) by mouth daily., Disp: 30 tablet, Rfl: 1   bacitracin-neomycin-polymyxin-hydrocortisone (CORTISPORIN) 1 % ophthalmic ointment, SMARTSIG:In Eye(s), Disp: , Rfl:    calcitRIOL (ROCALTROL) 0.5 MCG capsule, Take 0.5 mcg by mouth daily., Disp: , Rfl:    calcium carbonate (TUMS - DOSED IN MG ELEMENTAL CALCIUM) 500 MG chewable tablet, Chew 1 tablet by mouth 3 (three) times daily with meals., Disp: , Rfl:    diclofenac Sodium (VOLTAREN) 1 % GEL, Apply 4 g topically 4 (four) times daily. Apply to affected areas 4 times daily as needed for pain., Disp: 100 g, Rfl: 2   doxazosin (CARDURA) 8 MG tablet, Take 8 mg by mouth at bedtime., Disp: , Rfl: 5   isosorbide mononitrate (IMDUR) 60 MG 24 hr tablet, Take 1 tablet (60 mg total) by mouth daily., Disp: 30 tablet, Rfl: 1   torsemide (DEMADEX) 20 MG tablet, Take 60 mg by mouth 2 (two) times daily., Disp: , Rfl:    No Known  Allergies  Past Medical History:  Diagnosis Date   Acute combined systolic and diastolic CHF, NYHA class 4 (HCC) 06/10/2016   Cardiomyopathy ([place]) 06/14/2016   CHF (congestive heart failure) ([place])    CKD (chronic kidney disease)    COVID 10/2020   Dyspnea    Hypertension    Hypertensive heart and kidney disease with heart failure ([place]) 06/14/2016   Hypertensive heart disease with congestive heart failure ([place])    Obesity      Past Surgical History:  Procedure Laterality Date   AV FISTULA PLACEMENT Left 11/11/2020   Procedure: LEFT ARM First Stage Basilic Vein Fistula Creation;  Surgeon: Waynetta Sandy, MD;  Location: [place];  Service: Vascular;  Laterality: Left;   [place] Left 01/23/2021   Procedure: Second Stage Basilic Vein Transposition of Left Arm Arteriovenous  Fistula;  Surgeon: Waynetta Sandy, MD;  Location: [facility] OR;  Service: Vascular;  Laterality: Left;  Block  to LMA    COLONOSCOPY     NO PAST SURGERIES      Family History  Problem Relation Age of Onset   Hypertension Mother    Diabetes Father    Hypertension Father    Hypertension Sister    Hypertension Paternal Grandmother    Diabetes Mellitus II Paternal Grandfather    Heart disease Cousin    Heart disease Cousin    Colon cancer Cousin  Esophageal cancer Neg Hx    Rectal cancer Neg Hx    Stomach cancer Neg Hx     Social History   Tobacco Use   Smoking status: Former    Packs/day: 0.00    Years: 5.00    Total pack years: 0.00    Types: Cigarettes    Quit date: 09/2020    Years since quitting: 1.5   Smokeless tobacco: Never   Tobacco comments:    occasionally  Vaping Use   Vaping Use: Some days  Substance Use Topics   Alcohol use: Not Currently   Drug use: Not Currently    ROS   Objective:   Vitals: BP (!) 166/94 (BP Location: Right Arm)   Pulse 93   Temp 100.3 F (37.9 C) (Oral)   Resp 16   SpO2 93%   Physical Exam Constitutional:       General: He is not in acute distress.    Appearance: Normal appearance. He is well-developed and normal weight. He is not ill-appearing, toxic-appearing or diaphoretic.  HENT:     Head: Normocephalic and atraumatic.     Right Ear: External ear normal.     Left Ear: External ear normal.     Nose: Nose normal.     Mouth/Throat:     Pharynx: Oropharynx is clear.  Eyes:     General: No scleral icterus.       Right eye: No discharge.        Left eye: No discharge.     Extraocular Movements: Extraocular movements intact.  Cardiovascular:     Rate and Rhythm: Normal rate.  Pulmonary:     Effort: Pulmonary effort is normal.  Musculoskeletal:       Hands:     Cervical back: Normal range of motion.  Neurological:     Mental Status: He is alert and oriented to person, place, and time.  Psychiatric:        Mood and Affect: Mood normal.        Behavior: Behavior normal.        Thought Content: Thought content normal.        Judgment: Judgment normal.     Assessment and Plan :   PDMP not reviewed this encounter.  1. Puncture wound of left hand with infection, initial encounter   2. Left hand pain   3. CKD (chronic kidney disease) requiring chronic dialysis [facility])     Unfortunately our x-ray machine is down.  Were not able to obtain an x-ray onsite.  Patient has significant transportation issues and cannot afford to go to another site for an x-ray.  I advised that I would leave the x-ray on for him to get if this changes for him.  The best option he has outside of going to the hospital now would be to start doxycycline.  He is to follow-up very closely with Dr. Fredna Dow, the hand specialist on-call, otherwise.  I did recommend he maintain strict ER precautions however.   Jaynee Eagles, [place] 03/25/22 1637

## 2022-03-25 NOTE — Discharge Instructions (Addendum)
Start taking doxycycline for your hand infection.  I have placed orders to have an x-ray done at the med center in [facility].  Please had there now.  Try to go to the main hospital first.  If there is no receptionist at the main desk but then you would have to go through the emergency room.  Do not register as a patient there is you are only there only to get an x-ray.  I will call you with your results and update our treatment plan if necessary after I get the report.  Please wait to go pick up your prescriptions for any medications I have prescribed for you until after we discussed your x-ray results.  Make sure you follow-up with Dr. Fredna Dow by calling his clinic and requesting an appointment as soon as possible.  If they are closed today then call first thing tomorrow morning at 8 AM.  My concern is that you might need procedures to explore if there is a retained foreign body or if you need incision and drainage for the infection you have.  If you can get in with them tomorrow and you continue to worsen then please go to the [facility] emergency room.

## 2022-03-25 NOTE — ED Triage Notes (Signed)
Pt reports he was stuck with a splinter from a wooden crate a work ~10mo ago-redness/swelling noted to left hand-NAD-steady gait

## 2022-03-26 ENCOUNTER — Emergency Department (HOSPITAL_BASED_OUTPATIENT_CLINIC_OR_DEPARTMENT_OTHER): Payer: Medicare Other

## 2022-03-26 ENCOUNTER — Other Ambulatory Visit: Payer: Self-pay

## 2022-03-26 ENCOUNTER — Inpatient Hospital Stay (HOSPITAL_BASED_OUTPATIENT_CLINIC_OR_DEPARTMENT_OTHER)
Admission: EM | Admit: 2022-03-26 | Discharge: 2022-03-28 | DRG: 602 | Disposition: A | Discharge status: Home Health | Payer: Medicare Other | Attending: Internal Medicine | Admitting: Internal Medicine

## 2022-03-26 ENCOUNTER — Encounter (HOSPITAL_COMMUNITY): Payer: Self-pay

## 2022-03-26 ENCOUNTER — Encounter (HOSPITAL_BASED_OUTPATIENT_CLINIC_OR_DEPARTMENT_OTHER): Payer: Self-pay | Admitting: Emergency Medicine

## 2022-03-26 DIAGNOSIS — L02512 Cutaneous abscess of left hand: Principal | ICD-10-CM | POA: Diagnosis present

## 2022-03-26 DIAGNOSIS — I2510 Atherosclerotic heart disease of native coronary artery without angina pectoris: Secondary | ICD-10-CM | POA: Diagnosis not present

## 2022-03-26 DIAGNOSIS — Z800 Family history of malignant neoplasm of digestive organs: Secondary | ICD-10-CM | POA: Diagnosis not present

## 2022-03-26 DIAGNOSIS — S61442S Puncture wound with foreign body of left hand, sequela: Secondary | ICD-10-CM | POA: Diagnosis not present

## 2022-03-26 DIAGNOSIS — Z992 Dependence on renal dialysis: Secondary | ICD-10-CM

## 2022-03-26 DIAGNOSIS — I5032 Chronic diastolic (congestive) heart failure: Secondary | ICD-10-CM | POA: Diagnosis present

## 2022-03-26 DIAGNOSIS — E785 Hyperlipidemia, unspecified: Secondary | ICD-10-CM | POA: Diagnosis not present

## 2022-03-26 DIAGNOSIS — L03114 Cellulitis of left upper limb: Secondary | ICD-10-CM | POA: Diagnosis present

## 2022-03-26 DIAGNOSIS — Z833 Family history of diabetes mellitus: Secondary | ICD-10-CM

## 2022-03-26 DIAGNOSIS — W458XXS Other foreign body or object entering through skin, sequela: Secondary | ICD-10-CM | POA: Diagnosis not present

## 2022-03-26 DIAGNOSIS — Z8616 Personal history of COVID-19: Secondary | ICD-10-CM | POA: Diagnosis not present

## 2022-03-26 DIAGNOSIS — Z8249 Family history of ischemic heart disease and other diseases of the circulatory system: Secondary | ICD-10-CM | POA: Diagnosis not present

## 2022-03-26 DIAGNOSIS — Z87891 Personal history of nicotine dependence: Secondary | ICD-10-CM

## 2022-03-26 DIAGNOSIS — D631 Anemia in chronic kidney disease: Secondary | ICD-10-CM | POA: Diagnosis not present

## 2022-03-26 DIAGNOSIS — M109 Gout, unspecified: Secondary | ICD-10-CM | POA: Diagnosis present

## 2022-03-26 DIAGNOSIS — R69 Illness, unspecified: Secondary | ICD-10-CM

## 2022-03-26 DIAGNOSIS — M898X9 Other specified disorders of bone, unspecified site: Secondary | ICD-10-CM | POA: Diagnosis present

## 2022-03-26 DIAGNOSIS — I132 Hypertensive heart and chronic kidney disease with heart failure and with stage 5 chronic kidney disease, or end stage renal disease: Secondary | ICD-10-CM | POA: Diagnosis present

## 2022-03-26 DIAGNOSIS — L02519 Cutaneous abscess of unspecified hand: Secondary | ICD-10-CM | POA: Diagnosis present

## 2022-03-26 DIAGNOSIS — N186 End stage renal disease: Secondary | ICD-10-CM | POA: Diagnosis not present

## 2022-03-26 DIAGNOSIS — L03119 Cellulitis of unspecified part of limb: Secondary | ICD-10-CM

## 2022-03-26 DIAGNOSIS — I110 Hypertensive heart disease with heart failure: Secondary | ICD-10-CM | POA: Diagnosis present

## 2022-03-26 DIAGNOSIS — Z6841 Body Mass Index (BMI) 40.0 and over, adult: Secondary | ICD-10-CM | POA: Diagnosis not present

## 2022-03-26 DIAGNOSIS — I429 Cardiomyopathy, unspecified: Secondary | ICD-10-CM | POA: Diagnosis present

## 2022-03-26 DIAGNOSIS — Z79899 Other long term (current) drug therapy: Secondary | ICD-10-CM | POA: Diagnosis not present

## 2022-03-26 DIAGNOSIS — I5040 Unspecified combined systolic (congestive) and diastolic (congestive) heart failure: Secondary | ICD-10-CM | POA: Diagnosis present

## 2022-03-26 HISTORY — DX: Dependence on renal dialysis: N18.6

## 2022-03-26 HISTORY — DX: End stage renal disease: Z99.2

## 2022-03-26 HISTORY — DX: End stage renal disease: N18.6

## 2022-03-26 LAB — CBC WITH DIFFERENTIAL/PLATELET
Abs Immature Granulocytes: 0.05 10*3/uL (ref 0.00–0.07)
Basophils Absolute: 0 10*3/uL (ref 0.0–0.1)
Basophils Relative: 0 %
Eosinophils Absolute: 0.1 10*3/uL (ref 0.0–0.5)
Eosinophils Relative: 1 %
HCT: 31.2 % — ABNORMAL LOW (ref 39.0–52.0)
Hemoglobin: 10.5 g/dL — ABNORMAL LOW (ref 13.0–17.0)
Immature Granulocytes: 1 %
Lymphocytes Relative: 16 %
Lymphs Abs: 1.6 10*3/uL (ref 0.7–4.0)
MCH: 33.8 pg (ref 26.0–34.0)
MCHC: 33.7 g/dL (ref 30.0–36.0)
MCV: 100.3 fL — ABNORMAL HIGH (ref 80.0–100.0)
Monocytes Absolute: 1.2 10*3/uL — ABNORMAL HIGH (ref 0.1–1.0)
Monocytes Relative: 12 %
Neutro Abs: 7.2 10*3/uL (ref 1.7–7.7)
Neutrophils Relative %: 70 %
Platelets: 199 10*3/uL (ref 150–400)
RBC: 3.11 MIL/uL — ABNORMAL LOW (ref 4.22–5.81)
RDW: 12.6 % (ref 11.5–15.5)
WBC: 10.1 10*3/uL (ref 4.0–10.5)
nRBC: 0 % (ref 0.0–0.2)

## 2022-03-26 LAB — BASIC METABOLIC PANEL
Anion gap: 12 (ref 5–15)
BUN: 47 mg/dL — ABNORMAL HIGH (ref 6–20)
CO2: 29 mmol/L (ref 22–32)
Calcium: 8.9 mg/dL (ref 8.9–10.3)
Chloride: 96 mmol/L — ABNORMAL LOW (ref 98–111)
Creatinine, Ser: 9.99 mg/dL — ABNORMAL HIGH (ref 0.61–1.24)
GFR, Estimated: 6 mL/min — ABNORMAL LOW (ref >60)
Glucose, Bld: 107 mg/dL — ABNORMAL HIGH (ref 70–99)
Potassium: 4.3 mmol/L (ref 3.5–5.1)
Sodium: 137 mmol/L (ref 135–145)

## 2022-03-26 LAB — HEPATITIS B SURFACE ANTIGEN: Hepatitis B Surface Ag: NONREACTIVE

## 2022-03-26 LAB — HIV ANTIBODY (ROUTINE TESTING W REFLEX): HIV Screen 4th Generation wRfx: NONREACTIVE

## 2022-03-26 LAB — LACTIC ACID, PLASMA: Lactic Acid, Venous: 1.2 mmol/L (ref 0.5–1.9)

## 2022-03-26 MED ORDER — AMLODIPINE BESYLATE 10 MG PO TABS
10.0000 mg | ORAL_TABLET | Freq: Every day | ORAL | Status: DC
  Administered 2022-03-26 – 2022-03-28 (×3): 10 mg via ORAL
  Filled 2022-03-26 (×3): qty 1

## 2022-03-26 MED ORDER — FERRIC CITRATE 1 GM 210 MG(FE) PO TABS
630.0000 mg | ORAL_TABLET | Freq: Three times a day (TID) | ORAL | Status: DC
  Administered 2022-03-27 – 2022-03-28 (×3): 630 mg via ORAL
  Filled 2022-03-26 (×3): qty 3

## 2022-03-26 MED ORDER — VANCOMYCIN HCL IN DEXTROSE 1-5 GM/200ML-% IV SOLN
1000.0000 mg | Freq: Once | INTRAVENOUS | Status: AC
  Administered 2022-03-26: 1000 mg via INTRAVENOUS
  Filled 2022-03-26: qty 200

## 2022-03-26 MED ORDER — SODIUM CHLORIDE 0.9 % IV SOLN
2.0000 g | INTRAVENOUS | Status: DC
  Administered 2022-03-27: 2 g via INTRAVENOUS
  Filled 2022-03-26 (×2): qty 20

## 2022-03-26 MED ORDER — OXYCODONE HCL 5 MG PO TABS
5.0000 mg | ORAL_TABLET | ORAL | Status: DC | PRN
  Administered 2022-03-28: 5 mg via ORAL
  Filled 2022-03-26: qty 1

## 2022-03-26 MED ORDER — ISOSORBIDE MONONITRATE 20 MG PO TABS
10.0000 mg | ORAL_TABLET | Freq: Every day | ORAL | Status: DC
  Administered 2022-03-27 – 2022-03-28 (×2): 10 mg via ORAL
  Filled 2022-03-26 (×2): qty 1

## 2022-03-26 MED ORDER — LIDOCAINE-EPINEPHRINE (PF) 2 %-1:200000 IJ SOLN
10.0000 mL | Freq: Once | INTRAMUSCULAR | Status: AC
  Administered 2022-03-26: 10 mL
  Filled 2022-03-26: qty 20

## 2022-03-26 MED ORDER — VANCOMYCIN HCL IN DEXTROSE 1-5 GM/200ML-% IV SOLN
1000.0000 mg | INTRAVENOUS | Status: DC
  Administered 2022-03-27: 1000 mg via INTRAVENOUS
  Filled 2022-03-26: qty 200

## 2022-03-26 MED ORDER — CHLORHEXIDINE GLUCONATE CLOTH 2 % EX PADS
6.0000 | MEDICATED_PAD | Freq: Every day | CUTANEOUS | Status: DC
  Administered 2022-03-27 – 2022-03-28 (×2): 6 via TOPICAL

## 2022-03-26 MED ORDER — DOXAZOSIN MESYLATE 8 MG PO TABS
8.0000 mg | ORAL_TABLET | Freq: Every day | ORAL | Status: DC
  Administered 2022-03-26 – 2022-03-27 (×2): 8 mg via ORAL
  Filled 2022-03-26 (×2): qty 1

## 2022-03-26 MED ORDER — CARVEDILOL 25 MG PO TABS
25.0000 mg | ORAL_TABLET | Freq: Two times a day (BID) | ORAL | Status: DC
  Administered 2022-03-26 – 2022-03-28 (×4): 25 mg via ORAL
  Filled 2022-03-26 (×4): qty 1

## 2022-03-26 MED ORDER — SODIUM CHLORIDE 0.9 % IV SOLN
2.0000 g | Freq: Once | INTRAVENOUS | Status: AC
  Administered 2022-03-26: 2 g via INTRAVENOUS
  Filled 2022-03-26: qty 20

## 2022-03-26 MED ORDER — HYDROMORPHONE HCL 1 MG/ML IJ SOLN
1.0000 mg | Freq: Once | INTRAMUSCULAR | Status: AC
  Administered 2022-03-26: 1 mg via INTRAVENOUS
  Filled 2022-03-26: qty 1

## 2022-03-26 MED ORDER — TORSEMIDE 20 MG PO TABS
60.0000 mg | ORAL_TABLET | Freq: Two times a day (BID) | ORAL | Status: DC
  Administered 2022-03-26 – 2022-03-28 (×3): 60 mg via ORAL
  Filled 2022-03-26 (×3): qty 3

## 2022-03-26 MED ORDER — ALLOPURINOL 100 MG PO TABS
100.0000 mg | ORAL_TABLET | Freq: Every day | ORAL | Status: DC
  Administered 2022-03-27 – 2022-03-28 (×2): 100 mg via ORAL
  Filled 2022-03-26 (×2): qty 1

## 2022-03-26 NOTE — Progress Notes (Signed)
Pharmacy Antibiotic Note  Nathan Campbell is a 47 y.o. male admitted on 03/26/2022 with cellulitis.  Pharmacy has been consulted for vancomycin dosing. Pt with Tmax 100.3 and WBC is WNL. Pt on HD TThSa.   Plan: Vancomycin 2g IV x 1 then 1g QHD F/u renal fxn, C&S, clinical status and peak/trough at [facility] F/u continuation of gram neg coverage  Height: 6' (182.9 cm) Weight: (!) 142.9 kg (315 lb) IBW/kg (Calculated) : 77.6  Temp (24hrs), Avg:99.7 F (37.6 C), Min:99.1 F (37.3 C), Max:100.3 F (37.9 C)  Recent Labs  Lab 03/26/22 1244 03/26/22 1245  WBC 10.1  --   CREATININE 9.99*  --   LATICACIDVEN  --  1.2    Estimated Creatinine Clearance: 13.8 mL/min (A) (by C-G formula based on SCr of 9.99 mg/dL (H)).    No Known Allergies  Antimicrobials this admission: Vanc 1/12>> CTX x 1 1/12  Dose adjustments this admission: N/A  Microbiology results: Pending  Thank you for allowing pharmacy to be a part of this patient's care.  Leyda Vanderwerf, Rande Lawman 03/26/2022 12:24 PM

## 2022-03-26 NOTE — H&P (Signed)
History and Physical    Nathan Campbell RJJ:[id number] DOB: [birth year] DOA: 03/26/2022  PCP: Lucianne Lei, MD (Confirm with patient/family/NH records and if not entered, this has to be entered at [facility] point of entry) Patient coming from: Home  I have personally briefly reviewed patient's old medical records in [place]  Chief Complaint: Feeling better  HPI: Nathan Campbell is a 47 y.o. male with medical history significant of ESRD on HD TTS, HTN, chronic HFpEF, morbid obesity, presented with worsening of left hand swelling and pain.  Patient first sustained a initial puncture wound by a splinter on left thenar eminence of palm about month ago.  He did not seek medical attention and wound appeared to heal without problem.  Last week, he started to notice swelling underneath the left pulm, gradually developed pain around the left thenar eminence and base of the left thumb.  Overnight the pain became severe, worsening with movement of left thumb.  Denies any fever or chills no nauseous vomiting.  Still able to move the left thumb with severe pain no loss of sensation of left hand.  ED Course: Patient is afebrile, tachycardia no hypotension blood pressure elevated 45/89 no hypoxia.  CT left hand showed questionable foreign body surrounding skin thickening and subcutaneous soft tissue edema suggesting cellulitis and possible abscess.  Hand surgeon Dr. Tempie Donning consulted in the ED at [facility] and directed ED physician to perform a I&D which yielded large amount of pus.  Pharmacy at [facility] recommended vancomycin and ceftriaxone.  Review of Systems: As per HPI otherwise 14 point review of systems negative.    Past Medical History:  Diagnosis Date   Acute combined systolic and diastolic CHF, NYHA class 4 (HCC) 06/10/2016   Cardiomyopathy ([place]) 06/14/2016   CHF (congestive heart failure) ([place])    CKD (chronic kidney disease)    COVID 10/2020   Dyspnea    Hypertension    Hypertensive  heart and kidney disease with heart failure ([place]) 06/14/2016   Hypertensive heart disease with congestive heart failure ([place])    Obesity     Past Surgical History:  Procedure Laterality Date   AV FISTULA PLACEMENT Left 11/11/2020   Procedure: LEFT ARM First Stage Basilic Vein Fistula Creation;  Surgeon: Waynetta Sandy, MD;  Location: [place];  Service: Vascular;  Laterality: Left;   [place] Left 01/23/2021   Procedure: Second Stage Basilic Vein Transposition of Left Arm Arteriovenous  Fistula;  Surgeon: Waynetta Sandy, MD;  Location: [place];  Service: Vascular;  Laterality: Left;  Block  to LMA    COLONOSCOPY     NO PAST SURGERIES       reports that he quit smoking about 18 months ago. His smoking use included cigarettes. He has never used smokeless tobacco. He reports that he does not currently use alcohol. He reports that he does not currently use drugs.  No Known Allergies  Family History  Problem Relation Age of Onset   Hypertension Mother    Diabetes Father    Hypertension Father    Hypertension Sister    Hypertension Paternal Grandmother    Diabetes Mellitus II Paternal Grandfather    Heart disease Cousin    Heart disease Cousin    Colon cancer Cousin    Esophageal cancer Neg Hx    Rectal cancer Neg Hx    Stomach cancer Neg Hx      Prior to Admission medications   Medication Sig Start Date End Date  Taking? Authorizing Provider  allopurinol (ZYLOPRIM) 100 MG tablet Take 1 tablet (100 mg total) by mouth daily. Do not begin until pain in right knee is resolved. 09/13/21  Yes Lynden Oxford Scales, PA-C  amLODipine (NORVASC) 10 MG tablet Take 1 tablet (10 mg total) by mouth daily. 06/16/16  Yes Elmahi, Rae Lips, MD  AURYXIA 1 GM 210 MG(Fe) tablet Take 630 mg by mouth 3 (three) times daily with meals. Take 1 tablet with snacks. 03/18/22  Yes [provider]  B Complex-C-Zn-Folic Acid (DIALYVITE 532 WITH ZINC) 0.8 MG TABS Take 1 tablet  by mouth at bedtime. 02/16/22  Yes [provider]  carvedilol (COREG) 25 MG tablet Take 25 mg by mouth 2 (two) times daily. 12/13/21  Yes [provider]  doxazosin (CARDURA) 8 MG tablet Take 8 mg by mouth at bedtime. 11/03/17  Yes [provider]  doxycycline (VIBRAMYCIN) 100 MG capsule Take 1 capsule (100 mg total) by mouth 2 (two) times daily. 03/25/22  Yes Jaynee Eagles, PA-C  isosorbide mononitrate (ISMO) 10 MG tablet Take 10 mg by mouth daily. 03/11/22  Yes [provider]  torsemide (DEMADEX) 20 MG tablet Take 60 mg by mouth 2 (two) times daily. 10/08/20  Yes [provider]  diclofenac Sodium (VOLTAREN) 1 % GEL Apply 4 g topically 4 (four) times daily. Apply to affected areas 4 times daily as needed for pain. Patient not taking: Reported on 03/26/2022 09/13/21   Lynden Oxford Scales, PA-C    Physical Exam: Vitals:   03/26/22 1112 03/26/22 1200 03/26/22 1308 03/26/22 1537  BP: (!) 145/89  (!) 157/106 (!) 157/100  Pulse: (!) 110  96 (!) 103  Resp: '18  18 18  '$ Temp: 99.1 F (37.3 C)   98.5 F (36.9 C)  TempSrc: Oral   Oral  SpO2: 93%  100% 100%  Weight:  (!) 142.9 kg    Height:  6' (1.829 m)      Constitutional: NAD, calm, comfortable Vitals:   03/26/22 1112 03/26/22 1200 03/26/22 1308 03/26/22 1537  BP: (!) 145/89  (!) 157/106 (!) 157/100  Pulse: (!) 110  96 (!) 103  Resp: '18  18 18  '$ Temp: 99.1 F (37.3 C)   98.5 F (36.9 C)  TempSrc: Oral   Oral  SpO2: 93%  100% 100%  Weight:  (!) 142.9 kg    Height:  6' (1.829 m)     Eyes: PERRL, lids and conjunctivae normal ENMT: Mucous membranes are moist. Posterior pharynx clear of any exudate or lesions.Normal dentition.  Neck: normal, supple, no masses, no thyromegaly Respiratory: clear to auscultation bilaterally, no wheezing, no crackles. Normal respiratory effort. No accessory muscle use.  Cardiovascular: Regular rate and rhythm, no murmurs / rubs / gallops. No extremity edema. 2+ pedal  pulses. No carotid bruits.  Abdomen: no tenderness, no masses palpated. No hepatosplenomegaly. Bowel sounds positive.  Musculoskeletal: no clubbing / cyanosis. No joint deformity upper and lower extremities. Good ROM, no contractures. Normal muscle tone. Left hand in packing and surgical dressing, able to move left thumb without problem, sensation of left thumb preserved Skin: no rashes, lesions, ulcers. No induration Neurologic: CN 2-12 grossly intact. Sensation intact, DTR normal. Strength 5/5 in all 4.  Psychiatric: Normal judgment and insight. Alert and oriented x 3. Normal mood.     Labs on Admission: I have personally reviewed following labs and imaging studies  CBC: Recent Labs  Lab 03/26/22 1244  WBC 10.1  NEUTROABS 7.2  HGB 10.5*  HCT 31.2*  MCV 100.3*  PLT 921   Basic Metabolic Panel: Recent Labs  Lab 03/26/22 1244  NA 137  K 4.3  CL 96*  CO2 29  GLUCOSE 107*  BUN 47*  CREATININE 9.99*  CALCIUM 8.9   GFR: Estimated Creatinine Clearance: 13.8 mL/min (A) (by C-G formula based on SCr of 9.99 mg/dL (H)). Liver Function Tests: No results for input(s): "AST", "ALT", "ALKPHOS", "BILITOT", "PROT", "ALBUMIN" in the last 168 hours. No results for input(s): "LIPASE", "AMYLASE" in the last 168 hours. No results for input(s): "AMMONIA" in the last 168 hours. Coagulation Profile: No results for input(s): "INR", "PROTIME" in the last 168 hours. Cardiac Enzymes: No results for input(s): "CKTOTAL", "CKMB", "CKMBINDEX", "TROPONINI" in the last 168 hours. BNP (last 3 results) No results for input(s): "PROBNP" in the last 8760 hours. HbA1C: No results for input(s): "HGBA1C" in the last 72 hours. CBG: No results for input(s): "GLUCAP" in the last 168 hours. Lipid Profile: No results for input(s): "CHOL", "HDL", "LDLCALC", "TRIG", "CHOLHDL", "LDLDIRECT" in the last 72 hours. Thyroid Function Tests: No results for input(s): "TSH", "T4TOTAL", "FREET4", "T3FREE", "THYROIDAB" in  the last 72 hours. Anemia Panel: No results for input(s): "VITAMINB12", "FOLATE", "FERRITIN", "TIBC", "IRON", "RETICCTPCT" in the last 72 hours. Urine analysis:    Component Value Date/Time   COLORURINE YELLOW 06/14/2016 1939   APPEARANCEUR CLEAR 06/14/2016 1939   LABSPEC 1.011 06/14/2016 1939   PHURINE 6.0 06/14/2016 1939   GLUCOSEU NEGATIVE 06/14/2016 1939   HGBUR NEGATIVE 06/14/2016 1939   BILIRUBINUR NEGATIVE 06/14/2016 1939   KETONESUR NEGATIVE 06/14/2016 1939   PROTEINUR 100 (A) 06/14/2016 1939   NITRITE NEGATIVE 06/14/2016 1939   LEUKOCYTESUR NEGATIVE 06/14/2016 1939    Radiological Exams on Admission: CT Hand Left Wo Contrast  Result Date: 03/26/2022 CLINICAL DATA:  Soft tissue infection suspected, rule out abscess in the thenar eminence states he was picking up a wooden crates, concern for a splinter. EXAM: CT OF THE LEFT HAND WITHOUT CONTRAST TECHNIQUE: Multidetector CT imaging of the left hand was performed according to the standard protocol. Multiplanar CT image reconstructions were also generated. RADIATION DOSE REDUCTION: This exam was performed according to the departmental dose-optimization program which includes automated exposure control, adjustment of the mA and/or kV according to patient size and/or use of iterative reconstruction technique. COMPARISON:  None Available. FINDINGS: Bones/Joint/Cartilage No evidence of fracture or dislocation distal radius ulna, carpal bones are intact and in normal alignment. No significant degenerative changes. Metacarpals and phalanges are also maintained Ligaments Suboptimally assessed by CT. Muscles and Tendons No evidence of intramuscular fluid collection or abscess in particular of the thenar eminence muscles. Tendons of the flexor and extensor compartment are intact. Soft tissues There is small density in the thenar eminence skin (axial image 89; sagittal image 69), concerning for a foreign body with skin thickening and subcutaneous  soft tissue edema suggesting cellulitis. IMPRESSION: 1. Small density in the thenar eminence skin, concerning for a foreign body with surrounding skin thickening and subcutaneous soft tissue edema suggesting cellulitis. No evidence of fluid collection or abscess. 2. No evidence of intramuscular fluid collection or abscess in particular of the thenar eminence muscles. 3. No evidence of fracture or dislocation. Electronically Signed   By: Keane Police D.O.   On: 03/26/2022 13:15   DG Hand Complete Left  Result Date: 03/26/2022 CLINICAL DATA:  Swelling EXAM: LEFT HAND - COMPLETE 3+ VIEW COMPARISON:  04/16/2019 FINDINGS: There is no evidence of fracture or dislocation. Mild osteoarthritic  changes of the hand and wrist. Soft tissue swelling is most notable in the region of the thenar eminence. No radiopaque foreign body. IMPRESSION: Soft tissue swelling without acute osseous abnormality. No radiopaque foreign body. Electronically Signed   By: Davina Poke D.O.   On: 03/26/2022 11:36    EKG: None  Assessment/Plan Principal Problem:   Cellulitis and abscess of hand Active Problems:   Hypertensive heart disease with CHF (congestive heart failure) (HCC)   ESRD (end stage renal disease) (HCC)   Hand abscess  (please populate well all problems here in Problem List. (For example, if patient is on BP meds at home and you resume or decide to hold them, it is a problem that needs to be her. Same for CAD, COPD, HLD and so on)   Left hand abscess and cellulitis -Status post bedside I&D this afternoon at [facility] ER by ED physician -I discussed the case with Dr. Gladstone Lighter from [facility], who recommended home repeat imaging, and his impression is the full code from body on the CAT scan very likely due to scar tissue shadow. Hand surgeon will follow -Continue vancomycin and ceftriaxone as recommended by pharmacy -Follow up culture result  ESRD on HD TTS -Consult on-call nephrology Dr. Posey Pronto for routine  dialysis tomorrow if patient will stay through the weekend.  Chronic HFpEF and refractory HTN -BP uncontrolled, resume home BP meds including amlodipine, Coreg, Imdur, and torsemide.  Gout -No acute issue, continue allopurinol  Chronic normocytic anemia secondary to CKD -H&H stable  Morbid obesity -BMI 42, calorie restriction recommended  DVT prophylaxis: SCD Code Status: Full code Family Communication: None at bedside Disposition Plan: Expect less than 2 midnight hospital stay Consults called: [facility] Admission status: Medsurg obs   Lequita Halt MD [facility] Pager [phone]  03/26/2022, 6:43 PM

## 2022-03-26 NOTE — ED Triage Notes (Signed)
Splinter to left hand 2 on last December , was seen at [facility] yesterday , told to come to ED . Obvious swelling  yet no redness.

## 2022-03-26 NOTE — Progress Notes (Signed)
Plan of Care Note for accepted transfer   Patient: Nathan Campbell MRN: [id number]   [place]: 03/26/2022  Facility requesting transfer: [facility] Requesting Provider: Johnney Killian Reason for transfer: Hand infection Facility course: Patient with h/o chronic combined CHF, HTN, morbid obesity, and ESRD on HD presenting with a wound infection.  He had a puncture wound of his left palm (hand) about 3 months ago and has had 2 weeks of hand pain.  He was seen in UC yesterday and was given doxy with plan for outpatient f/u with Dr. Fredna Dow. It is persistently red and painful.  CT with possible foreign body in thenar eminence.  Needs IV antibiotics.  She will try to remove the FB but he is likely to need hand surgery consultation upon arrival.    Plan of care: The patient is accepted for admission to [place]  unit, at [facility].    Author: Karmen Bongo, MD 03/26/2022  Check [email or web address] for on-call coverage.  Nursing staff, Please call [place] number on Amion as soon as patient's arrival, so appropriate admitting provider can evaluate the pt.

## 2022-03-26 NOTE — ED Notes (Signed)
Applied dressing on Left hand ( additional supplies for home care)

## 2022-03-26 NOTE — ED Notes (Signed)
[place] signature pad not working. Pt verbalized verbal consent for transfer.

## 2022-03-26 NOTE — ED Provider Notes (Signed)
[facility] EMERGENCY DEPARTMENT Provider Note   CSN: [id number] Arrival date & time: 03/26/22  1054     History  Chief Complaint  Patient presents with   Cellulitis    left    Nathan Campbell is a 47 y.o. male.  HPI Patient has developed increasing pain and redness of the left thenar eminence.  He was seen at urgent care yesterday and started on doxycycline last night.  Patient reports he had been working with wood about a month ago and he thought he got a little bit of a splinter into his hand.  He reports it was not really giving him much of a problem until a few days ago when quite abruptly he started getting pain and swelling at the base of the thumb.  He reports today it is quite painful and red.  No fevers no chills.  Reports he feels well otherwise.  Patient is a dialysis patient and his fistula is on the left side as well.  He reports he last ate at breakfast time at 9 AM today.    Home Medications Prior to Admission medications   Medication Sig Start Date End Date Taking? Authorizing Provider  allopurinol (ZYLOPRIM) 100 MG tablet Take 1 tablet (100 mg total) by mouth daily. Do not begin until pain in right knee is resolved. 09/13/21  Yes Lynden Oxford Scales, PA-C  amLODipine (NORVASC) 10 MG tablet Take 1 tablet (10 mg total) by mouth daily. 06/16/16  Yes Elmahi, Rae Lips, MD  AURYXIA 1 GM 210 MG(Fe) tablet Take 630 mg by mouth 3 (three) times daily with meals. Take 1 tablet with snacks. 03/18/22  Yes [provider]  B Complex-C-Zn-Folic Acid (DIALYVITE 027 WITH ZINC) 0.8 MG TABS Take 1 tablet by mouth at bedtime. 02/16/22  Yes [provider]  carvedilol (COREG) 25 MG tablet Take 25 mg by mouth 2 (two) times daily. 12/13/21  Yes [provider]  doxazosin (CARDURA) 8 MG tablet Take 8 mg by mouth at bedtime. 11/03/17  Yes [provider]  doxycycline (VIBRAMYCIN) 100 MG capsule Take 1 capsule (100 mg total) by mouth 2 (two) times daily.  03/25/22  Yes Jaynee Eagles, PA-C  isosorbide mononitrate (ISMO) 10 MG tablet Take 10 mg by mouth daily. 03/11/22  Yes [provider]  torsemide (DEMADEX) 20 MG tablet Take 60 mg by mouth 2 (two) times daily. 10/08/20  Yes [provider]  diclofenac Sodium (VOLTAREN) 1 % GEL Apply 4 g topically 4 (four) times daily. Apply to affected areas 4 times daily as needed for pain. Patient not taking: Reported on 03/26/2022 09/13/21   Lynden Oxford Scales, PA-C      Allergies    Patient has no known allergies.    Review of Systems   Review of Systems  Physical Exam Updated Vital Signs BP (!) 157/100   Pulse (!) 103   Temp 98.5 F (36.9 C) (Oral)   Resp 18   Ht 6' (1.829 m)   Wt (!) 142.9 kg   SpO2 100%   BMI 42.72 kg/m  Physical Exam Constitutional:      Comments: Patient is alert nontoxic.  No acute distress.  No respiratory chest.  Eyes:     Extraocular Movements: Extraocular movements intact.  Pulmonary:     Effort: Pulmonary effort is normal.  Musculoskeletal:     Comments: Patient is very tender to palpation at the thenar eminence.  He does not have diffuse swelling of the thumb on the  left.  Range of motion is preserved at the interphalangeal and the distal interphalangeal joints.  Pain to the dorsal aspect of the hand or over the wrist.  Skin:    General: Skin is warm and dry.  Neurological:     General: No focal deficit present.     Mental Status: He is oriented to person, place, and time.  Psychiatric:        Mood and Affect: Mood normal.        ED Results / Procedures / Treatments   Labs (all labs ordered are listed, but only abnormal results are displayed) Labs Reviewed  BASIC METABOLIC PANEL - Abnormal; Notable for the following components:      Result Value   Chloride 96 (*)    Glucose, Bld 107 (*)    BUN 47 (*)    Creatinine, Ser 9.99 (*)    GFR, Estimated 6 (*)    All other components within normal limits  CBC WITH DIFFERENTIAL/PLATELET  - Abnormal; Notable for the following components:   RBC 3.11 (*)    Hemoglobin 10.5 (*)    HCT 31.2 (*)    MCV 100.3 (*)    Monocytes Absolute 1.2 (*)    All other components within normal limits  LACTIC ACID, PLASMA    EKG None  Radiology CT Hand Left Wo Contrast  Result Date: 03/26/2022 CLINICAL DATA:  Soft tissue infection suspected, rule out abscess in the thenar eminence states he was picking up a wooden crates, concern for a splinter. EXAM: CT OF THE LEFT HAND WITHOUT CONTRAST TECHNIQUE: Multidetector CT imaging of the left hand was performed according to the standard protocol. Multiplanar CT image reconstructions were also generated. RADIATION DOSE REDUCTION: This exam was performed according to the departmental dose-optimization program which includes automated exposure control, adjustment of the mA and/or kV according to patient size and/or use of iterative reconstruction technique. COMPARISON:  None Available. FINDINGS: Bones/Joint/Cartilage No evidence of fracture or dislocation distal radius ulna, carpal bones are intact and in normal alignment. No significant degenerative changes. Metacarpals and phalanges are also maintained Ligaments Suboptimally assessed by CT. Muscles and Tendons No evidence of intramuscular fluid collection or abscess in particular of the thenar eminence muscles. Tendons of the flexor and extensor compartment are intact. Soft tissues There is small density in the thenar eminence skin (axial image 89; sagittal image 69), concerning for a foreign body with skin thickening and subcutaneous soft tissue edema suggesting cellulitis. IMPRESSION: 1. Small density in the thenar eminence skin, concerning for a foreign body with surrounding skin thickening and subcutaneous soft tissue edema suggesting cellulitis. No evidence of fluid collection or abscess. 2. No evidence of intramuscular fluid collection or abscess in particular of the thenar eminence muscles. 3. No evidence  of fracture or dislocation. Electronically Signed   By: Keane Police D.O.   On: 03/26/2022 13:15   DG Hand Complete Left  Result Date: 03/26/2022 CLINICAL DATA:  Swelling EXAM: LEFT HAND - COMPLETE 3+ VIEW COMPARISON:  04/16/2019 FINDINGS: There is no evidence of fracture or dislocation. Mild osteoarthritic changes of the hand and wrist. Soft tissue swelling is most notable in the region of the thenar eminence. No radiopaque foreign body. IMPRESSION: Soft tissue swelling without acute osseous abnormality. No radiopaque foreign body. Electronically Signed   By: Davina Poke D.O.   On: 03/26/2022 11:36    Procedures .Marland KitchenIncision and Drainage  Date/Time: 03/26/2022 4:00 PM  Performed by: Charlesetta Shanks, MD Authorized by: Charlesetta Shanks,  MD   Consent:    Consent obtained:  Verbal   Consent given by:  Patient   Risks discussed:  Bleeding, incomplete drainage, infection and pain Location:    Type:  Abscess   Size:  1cm   Location:  Upper extremity   Upper extremity location:  Hand Pre-procedure details:    Skin preparation:  Povidone-iodine Anesthesia:    Anesthesia method:  Local infiltration   Local anesthetic:  Lidocaine 2% WITH epi Procedure type:    Complexity:  Complex Procedure details:    Incision types:  Single straight   Incision depth:  Subcutaneous   Wound management:  Probed and deloculated   Drainage:  Purulent   Drainage amount:  Copious   Packing materials:  1/4 in iodoform gauze Post-procedure details:    Procedure completion:  Tolerated well, no immediate complications     Medications Ordered in ED Medications  vancomycin (VANCOCIN) IVPB 1000 mg/200 mL premix (0 mg Intravenous Stopped 03/26/22 1438)    And  vancomycin (VANCOCIN) IVPB 1000 mg/200 mL premix (1,000 mg Intravenous New Bag/Given 03/26/22 1538)  vancomycin (VANCOCIN) IVPB 1000 mg/200 mL premix (has no administration in time range)  cefTRIAXone (ROCEPHIN) 2 g in sodium chloride 0.9 % 100 mL IVPB  (0 g Intravenous Stopped 03/26/22 1341)  lidocaine-EPINEPHrine (XYLOCAINE W/EPI) 2 %-1:200000 (PF) injection 10 mL (10 mLs Infiltration Given by Other 03/26/22 1531)  HYDROmorphone (DILAUDID) injection 1 mg (1 mg Intravenous Given 03/26/22 1538)    ED Course/ Medical Decision Making/ A&P                           Medical Decision Making Amount and/or Complexity of Data Reviewed Labs: ordered. Radiology: ordered.  Risk Prescription drug management. Decision regarding hospitalization.  Patient presents with hand infection as outlined.  This is likely the recrudescence of infection from a retained foreign body from about a month ago.  By the patient description is probably wood.  At this time with patient having high risk for advancing infection with dialysis graft in same extremity and significant comorbid condition, will proceed with IV antibiotics, lab work and imaging.  Consult: Reviewed with Dr. Tempie Donning hand surgery.  Suggest getting CT scan of the hand to look for abscess in the deeper spaces.  Call them back if there is positive abscess finding, if not initiate antibiotic Tx.  CT scan reviewed by radiology and also visually reviewed by myself, does not note any focal fluid pocket but a possible area of retained foreign body.  I reviewed this with Dr. Tempie Donning he suggests focal I&D around the area of the raised callus.  I&D resulted in copious purulent drainage.  4 inch iodoform packing placed.  Consult Dr. Lorin Mercy for admission [facility] hospitalist.  Patient is alert in no distress.  His mental status is clear.  He is not showing signs of respiratory distress.  Patient does get dialysis.  I have not been fluid resuscitation as he is not showing signs of sepsis.  He has been treated with ceftriaxone and vancomycin per pharmacy recommendations.  Waiting admission.        Final Clinical Impression(s) / ED Diagnoses Final diagnoses:  Hand abscess  Severe comorbid illness    Rx /  DC Orders ED Discharge Orders     None         Charlesetta Shanks, MD 03/26/22 1605

## 2022-03-26 NOTE — ED Notes (Signed)
Client here for evaluation of left hand pain at base of left thumb, area is swollen and tender to touch, states he was picking up wooden crates and thought he had removed all of what he believes was a splinter.

## 2022-03-27 ENCOUNTER — Encounter (HOSPITAL_COMMUNITY): Payer: Self-pay | Admitting: Internal Medicine

## 2022-03-27 DIAGNOSIS — N186 End stage renal disease: Secondary | ICD-10-CM | POA: Diagnosis not present

## 2022-03-27 DIAGNOSIS — I5032 Chronic diastolic (congestive) heart failure: Secondary | ICD-10-CM | POA: Diagnosis present

## 2022-03-27 DIAGNOSIS — L02512 Cutaneous abscess of left hand: Secondary | ICD-10-CM | POA: Diagnosis present

## 2022-03-27 DIAGNOSIS — Z800 Family history of malignant neoplasm of digestive organs: Secondary | ICD-10-CM | POA: Diagnosis not present

## 2022-03-27 DIAGNOSIS — L03119 Cellulitis of unspecified part of limb: Secondary | ICD-10-CM | POA: Diagnosis not present

## 2022-03-27 DIAGNOSIS — W458XXS Other foreign body or object entering through skin, sequela: Secondary | ICD-10-CM | POA: Diagnosis present

## 2022-03-27 DIAGNOSIS — Z833 Family history of diabetes mellitus: Secondary | ICD-10-CM | POA: Diagnosis not present

## 2022-03-27 DIAGNOSIS — I2510 Atherosclerotic heart disease of native coronary artery without angina pectoris: Secondary | ICD-10-CM | POA: Diagnosis present

## 2022-03-27 DIAGNOSIS — E785 Hyperlipidemia, unspecified: Secondary | ICD-10-CM | POA: Diagnosis present

## 2022-03-27 DIAGNOSIS — S61442S Puncture wound with foreign body of left hand, sequela: Secondary | ICD-10-CM | POA: Diagnosis not present

## 2022-03-27 DIAGNOSIS — Z6841 Body Mass Index (BMI) 40.0 and over, adult: Secondary | ICD-10-CM | POA: Diagnosis not present

## 2022-03-27 DIAGNOSIS — Z992 Dependence on renal dialysis: Secondary | ICD-10-CM | POA: Diagnosis not present

## 2022-03-27 DIAGNOSIS — L03114 Cellulitis of left upper limb: Secondary | ICD-10-CM | POA: Diagnosis present

## 2022-03-27 DIAGNOSIS — M109 Gout, unspecified: Secondary | ICD-10-CM | POA: Diagnosis present

## 2022-03-27 DIAGNOSIS — Z87891 Personal history of nicotine dependence: Secondary | ICD-10-CM | POA: Diagnosis not present

## 2022-03-27 DIAGNOSIS — D631 Anemia in chronic kidney disease: Secondary | ICD-10-CM | POA: Diagnosis present

## 2022-03-27 DIAGNOSIS — I132 Hypertensive heart and chronic kidney disease with heart failure and with stage 5 chronic kidney disease, or end stage renal disease: Secondary | ICD-10-CM | POA: Diagnosis present

## 2022-03-27 DIAGNOSIS — Z8616 Personal history of COVID-19: Secondary | ICD-10-CM | POA: Diagnosis not present

## 2022-03-27 DIAGNOSIS — I429 Cardiomyopathy, unspecified: Secondary | ICD-10-CM | POA: Diagnosis present

## 2022-03-27 DIAGNOSIS — Z79899 Other long term (current) drug therapy: Secondary | ICD-10-CM | POA: Diagnosis not present

## 2022-03-27 DIAGNOSIS — M898X9 Other specified disorders of bone, unspecified site: Secondary | ICD-10-CM | POA: Diagnosis present

## 2022-03-27 DIAGNOSIS — Z8249 Family history of ischemic heart disease and other diseases of the circulatory system: Secondary | ICD-10-CM | POA: Diagnosis not present

## 2022-03-27 DIAGNOSIS — L02519 Cutaneous abscess of unspecified hand: Secondary | ICD-10-CM | POA: Diagnosis present

## 2022-03-27 LAB — CBC
HCT: 28.7 % — ABNORMAL LOW (ref 39.0–52.0)
Hemoglobin: 9.7 g/dL — ABNORMAL LOW (ref 13.0–17.0)
MCH: 33.8 pg (ref 26.0–34.0)
MCHC: 33.8 g/dL (ref 30.0–36.0)
MCV: 100 fL (ref 80.0–100.0)
Platelets: 188 10*3/uL (ref 150–400)
RBC: 2.87 MIL/uL — ABNORMAL LOW (ref 4.22–5.81)
RDW: 12.5 % (ref 11.5–15.5)
WBC: 11.8 10*3/uL — ABNORMAL HIGH (ref 4.0–10.5)
nRBC: 0 % (ref 0.0–0.2)

## 2022-03-27 LAB — BASIC METABOLIC PANEL
Anion gap: 15 (ref 5–15)
BUN: 52 mg/dL — ABNORMAL HIGH (ref 6–20)
CO2: 26 mmol/L (ref 22–32)
Calcium: 9 mg/dL (ref 8.9–10.3)
Chloride: 96 mmol/L — ABNORMAL LOW (ref 98–111)
Creatinine, Ser: 11.47 mg/dL — ABNORMAL HIGH (ref 0.61–1.24)
GFR, Estimated: 5 mL/min — ABNORMAL LOW (ref >60)
Glucose, Bld: 146 mg/dL — ABNORMAL HIGH (ref 70–99)
Potassium: 3.9 mmol/L (ref 3.5–5.1)
Sodium: 137 mmol/L (ref 135–145)

## 2022-03-27 LAB — PHOSPHORUS: Phosphorus: 5.4 mg/dL — ABNORMAL HIGH (ref 2.5–4.6)

## 2022-03-27 LAB — ALBUMIN: Albumin: 3.4 g/dL — ABNORMAL LOW (ref 3.5–5.0)

## 2022-03-27 MED ORDER — CHLORHEXIDINE GLUCONATE 4 % EX LIQD
Freq: Every day | CUTANEOUS | Status: DC | PRN
  Filled 2022-03-27 (×2): qty 45

## 2022-03-27 MED ORDER — PENTAFLUOROPROP-TETRAFLUOROETH EX AERO: 1.0000 | INHALATION_SPRAY | CUTANEOUS | Status: DC | PRN

## 2022-03-27 MED ORDER — HEPARIN SODIUM (PORCINE) 1000 UNIT/ML IJ SOLN
3000.0000 [IU] | Freq: Once | INTRAMUSCULAR | Status: AC
  Administered 2022-03-27: 3000 [IU] via INTRAVENOUS

## 2022-03-27 MED ORDER — LIDOCAINE HCL (PF) 1 % IJ SOLN: 5.0000 mL | INTRAMUSCULAR | Status: DC | PRN

## 2022-03-27 MED ORDER — DOXERCALCIFEROL 4 MCG/2ML IV SOLN: 2.0000 ug | INTRAVENOUS | Status: DC

## 2022-03-27 MED ORDER — LIDOCAINE-PRILOCAINE 2.5-2.5 % EX CREA: 1.0000 | TOPICAL_CREAM | CUTANEOUS | Status: DC | PRN

## 2022-03-27 MED ORDER — ONDANSETRON HCL 4 MG/2ML IJ SOLN
4.0000 mg | Freq: Four times a day (QID) | INTRAMUSCULAR | Status: DC | PRN
  Administered 2022-03-27: 4 mg via INTRAVENOUS
  Filled 2022-03-27: qty 2

## 2022-03-27 MED ORDER — HEPARIN SODIUM (PORCINE) 1000 UNIT/ML IJ SOLN
INTRAMUSCULAR | Status: AC
  Filled 2022-03-27: qty 3

## 2022-03-27 NOTE — Procedures (Signed)
   I was present at this dialysis session, have reviewed the session itself and made  appropriate changes Kelly Splinter MD [place] pager [phone]   03/27/2022, 1:05 PM

## 2022-03-27 NOTE — Consult Note (Signed)
Renal Service Consult Note [facility]  [place] 03/27/2022 Sol Blazing, MD Requesting Physician: Dr. Sloan Leiter  Reason for Consult: ESRD pt w/ L hand infection HPI: The patient is a 47 y.o. year-old w/ PMH as below who presented w/ painful and red L hand.  Had a splinter in December. In ED pt was tachycardic, BP's normal, no hyoxia. CT hand showed cellulitis and poss abscess. Pt was moved from ED [facility] to [facility] for admission and possibly surgery. IV vanc/ rocephin were started. We are asked to see for dialysis.   Pt seen on HD. Has no c/o's, has been sleeping. Does HD on TTS schedule. HD nurse states his AVF is tenuous and they keep having to adjust the needles due to high venous pressures.   ROS - denies CP, no joint pain, no HA, no blurry vision, no rash, no diarrhea, no nausea/ vomiting, no dysuria, no difficulty voiding   Past Medical History  Past Medical History:  Diagnosis Date   Acute combined systolic and diastolic CHF, NYHA class 4 ([place]) 06/10/2016   Cardiomyopathy ([place]) 06/14/2016   CHF (congestive heart failure) ([place])    CKD (chronic kidney disease)    COVID 10/2020   Dyspnea    Hypertension    Hypertensive heart and kidney disease with heart failure ([place]) 06/14/2016   Hypertensive heart disease with congestive heart failure ([place])    Obesity    Past Surgical History  Past Surgical History:  Procedure Laterality Date   AV FISTULA PLACEMENT Left 11/11/2020   Procedure: LEFT ARM First Stage Basilic Vein Fistula Creation;  Surgeon: Waynetta Sandy, MD;  Location: [place];  Service: Vascular;  Laterality: Left;   [place] Left 01/23/2021   Procedure: Second Stage Basilic Vein Transposition of Left Arm Arteriovenous  Fistula;  Surgeon: Waynetta Sandy, MD;  Location: [place];  Service: Vascular;  Laterality: Left;  Block  to LMA    COLONOSCOPY     NO PAST SURGERIES     Family History  Family History  Problem  Relation Age of Onset   Hypertension Mother    Diabetes Father    Hypertension Father    Hypertension Sister    Hypertension Paternal Grandmother    Diabetes Mellitus II Paternal Grandfather    Heart disease Cousin    Heart disease Cousin    Colon cancer Cousin    Esophageal cancer Neg Hx    Rectal cancer Neg Hx    Stomach cancer Neg Hx    Social History  reports that he quit smoking about 18 months ago. His smoking use included cigarettes. He has never used smokeless tobacco. He reports that he does not currently use alcohol. He reports that he does not currently use drugs. Allergies No Known Allergies Home medications Prior to Admission medications   Medication Sig Start Date End Date Taking? Authorizing Provider  allopurinol (ZYLOPRIM) 100 MG tablet Take 1 tablet (100 mg total) by mouth daily. Do not begin until pain in right knee is resolved. 09/13/21  Yes Lynden Oxford Scales, PA-C  amLODipine (NORVASC) 10 MG tablet Take 1 tablet (10 mg total) by mouth daily. 06/16/16  Yes Elmahi, Rae Lips, MD  AURYXIA 1 GM 210 MG(Fe) tablet Take 630 mg by mouth 3 (three) times daily with meals. Take 1 tablet with snacks. 03/18/22  Yes [provider]  B Complex-C-Zn-Folic Acid (DIALYVITE 950 WITH ZINC) 0.8 MG TABS Take 1 tablet by mouth at bedtime. 02/16/22  Yes [provider]  carvedilol (COREG) 25 MG tablet Take 25 mg by mouth 2 (two) times daily. 12/13/21  Yes [provider]  doxazosin (CARDURA) 8 MG tablet Take 8 mg by mouth at bedtime. 11/03/17  Yes [provider]  doxycycline (VIBRAMYCIN) 100 MG capsule Take 1 capsule (100 mg total) by mouth 2 (two) times daily. 03/25/22  Yes Jaynee Eagles, PA-C  isosorbide mononitrate (ISMO) 10 MG tablet Take 10 mg by mouth daily. 03/11/22  Yes [provider]  torsemide (DEMADEX) 20 MG tablet Take 60 mg by mouth 2 (two) times daily. 10/08/20  Yes [provider]  diclofenac Sodium (VOLTAREN) 1 % GEL Apply 4 g  topically 4 (four) times daily. Apply to affected areas 4 times daily as needed for pain. Patient not taking: Reported on 03/26/2022 09/13/21   Lynden Oxford Scales, PA-C     Vitals:   03/27/22 1000 03/27/22 1030 03/27/22 1100 03/27/22 1130  BP: (!) 151/65 137/76 138/64 (!) 145/72  Pulse: 90 88 94 72  Resp: (!) 24 (!) 26 (!) 22 (!) 25  Temp:      TempSrc:      SpO2: 99% 100% 91% (!) 66%  Weight:      Height:       Exam Gen alert, no distress, loud snoring on HD machine Awakens and Ox 3 No rash, cyanosis or gangrene Sclera anicteric, throat clear  No jvd or bruits Chest clear bilat to bases, no rales/ wheezing RRR no MRG Abd soft ntnd obese no mass or ascites +bs GU normal male MS no joint effusions or deformity Ext no LE or UE edema, no wounds or ulcers Neuro is alert, Ox 3 , nf    LUA AVF+bruit    Home meds include - allopurinol, norvasc 10, auryxia 3 ac, dialyvite, coreg 25 bid, cardura 8, imdur 10 mg, torsemide 60 bid, prns/ vits/ supps     OP HD: [facility] TTS  4h 16mn  500/600  138.5kg  2/2 bath  LUA  AVF  Hep 4000 - hectorol 2 ug IV tiw - last HD 1/11, post wt 138.7kg, mod large IDWG's - last Hb 11.4, no esa   Assessment/ Plan: L hand infection - cellulitis, poss abscess per CT.  Started on IV abx, and pt had I&D in the ED yesterday.   ESRD - on HD TTS. HD today. Has not missed HD.  HTN/ volume - no gross vol excess on exam, and is on RA Anemia esrd - Hb 9-10.5 here, not on esa. Follow.  MBD ckd - Ca in range, will add on phos. Cont IV vdra and auryxia as binder H/o combined CHF      Rob Afton Lavalle  MD 03/27/2022, 12:39 PM Recent Labs  Lab 03/26/22 1244 03/27/22 0235  HGB 10.5* 9.7*  CALCIUM 8.9 9.0  CREATININE 9.99* 11.47*  K 4.3 3.9   Inpatient medications:  allopurinol  100 mg Oral Daily   amLODipine  10 mg Oral Daily   carvedilol  25 mg Oral BID   Chlorhexidine Gluconate Cloth  6 each Topical Q0600   doxazosin  8 mg Oral QHS   ferric  citrate  630 mg Oral TID WC   heparin sodium (porcine)       isosorbide mononitrate  10 mg Oral Daily   torsemide  60 mg Oral BID    cefTRIAXone (ROCEPHIN)  IV     vancomycin 1,000 mg (03/27/22 1123)   heparin sodium (porcine), lidocaine (PF), lidocaine-prilocaine, oxyCODONE,  pentafluoroprop-tetrafluoroeth

## 2022-03-27 NOTE — Progress Notes (Signed)
Received patient before the start of the treatment .Awake ,alert and oriented x 4 . Vitals stable.  Access used : Left upper arm fistula,multiple aneurism noted.access gives beeping every now and then. Heparin given with a little help.Renal MD at the bedside made aware.  Duration of treatment : 3.75 hours  Medicines given : Heparin 3,000 units at mid treatment.                               Vancomycin 1 g = 200 cc.  Fluid removed : Met prescribed UF of 3,700 cc                                   Hermodialysis issue.Patient tolerated his treatment.  Hand off to patient's nurse.

## 2022-03-27 NOTE — Progress Notes (Signed)
PROGRESS NOTE        PATIENT DETAILS Name: Nathan Campbell Age: 47 y.o. Sex: male Date of Birth: 03-Nov-1975 Admit Date: 03/26/2022 Admitting Physician Lequita Halt, MD VEH:MCNOB, Myra Rude, MD  Brief Summary: Patient is a 47 y.o.  male with a history of ESRD on HD TTS, chronic HFpEF, who presented with left hand abscess-s/p I&D in the ED-subsequently admitted to the hospitalist service for IV antibiotics/further evaluation.  Significant events: 1/12>> presented with left hand abscess-s/p I&D by ED MD.  Admit to [facility].  Significant studies: 1/12>> CT left hand: Small density in the thenar eminence-concerning for foreign body with surrounding skin thickening/subcutaneous soft tissue edema suggesting cellulitis.  No evidence of abscess.  Significant microbiology data: None  Procedures: 1/12>> I&D by ED MD  Consults: None  Subjective: Seen earlier this morning in hemodialysis.  Claims left hand is less tense/less painful.  Compressive dressing in place.  Objective: Vitals: Blood pressure (!) 153/77, pulse 91, temperature 97.9 F (36.6 C), temperature source Oral, resp. rate (!) 26, height 6' (1.829 m), weight (!) 142.9 kg, SpO2 98 %.   Exam: Gen Exam:Alert awake-not in any distress HEENT:atraumatic, normocephalic Chest: B/L clear to auscultation anteriorly CVS:S1S2 regular Abdomen:soft non tender, non distended Extremities:no edema Neurology: Non focal Skin: no rash  Pertinent Labs/Radiology:    Latest Ref Rng & Units 03/27/2022    2:35 AM 03/26/2022   12:44 PM 04/15/2021    1:47 PM  CBC  WBC 4.0 - 10.5 K/uL 11.8  10.1    Hemoglobin 13.0 - 17.0 g/dL 9.7  10.5  9.3   Hematocrit 39.0 - 52.0 % 28.7  31.2    Platelets 150 - 400 K/uL 188  199      Lab Results  Component Value Date   NA 137 03/27/2022   K 3.9 03/27/2022   CL 96 (L) 03/27/2022   CO2 26 03/27/2022      Assessment/Plan: Left hand abscess-s/p I&D by ED MD on 1/12 Per patient  report-his left hand is less swollen/tender/painful this morning. Cultures never sent Since purulent-suspect will need antistaphylococcal therapy-for now continue with vancomycin/Rocephin Per H&P-possible foreign body seen in hand is likely a scar tissue shadow per hand surgery. Admitting MD spoke with hand surgery-Dr. Gladstone Lighter on 1/12-await further recommendations.  ESRD on HD TTS Nephrology following  Normocytic anemia Secondary to ESRD Follow Hb periodically  Chronic HFpEF Euvolemic Volume removal with HD  HTN BP stable Continue amlodipine/Coreg/Imdur  Gout Not in flare Continue allopurinol  Morbid Obesity: Estimated body mass index is 42.72 kg/m as calculated from the following:   Height as of this encounter: 6' (1.829 m).   Weight as of this encounter: 142.9 kg.   Code status:   Code Status: Full Code   DVT Prophylaxis: SCDs Start: 03/26/22 1837 hold off on pharmacological prophylaxis until hand surgery evaluation has been performed to ensure no further I&D is required.   Family Communication: None at bedside   Disposition Plan: Status is: Observation The patient will require care spanning > 2 midnights and should be moved to inpatient because: Severity of illness.   Planned Discharge Destination:Home in the next day or so.   Diet: Diet Order             Diet renal with fluid restriction Fluid restriction: 1200 mL Fluid; Room service appropriate? Yes; Fluid consistency:  Thin  Diet effective now                     Antimicrobial agents: Anti-infectives (From admission, onward)    Start     Dose/Rate Route Frequency Ordered Stop   03/27/22 1200  vancomycin (VANCOCIN) IVPB 1000 mg/200 mL premix        1,000 mg 200 mL/hr over 60 Minutes Intravenous Every T-Th-Sa (Hemodialysis) 03/26/22 1334     03/27/22 1000  cefTRIAXone (ROCEPHIN) 2 g in sodium chloride 0.9 % 100 mL IVPB        2 g 200 mL/hr over 30 Minutes Intravenous Every 24 hours 03/26/22  1828     03/26/22 1400  vancomycin (VANCOCIN) IVPB 1000 mg/200 mL premix       See Hyperspace for full Linked Orders Report.   1,000 mg 200 mL/hr over 60 Minutes Intravenous  Once 03/26/22 1223 03/26/22 1713   03/26/22 1230  cefTRIAXone (ROCEPHIN) 2 g in sodium chloride 0.9 % 100 mL IVPB        2 g 200 mL/hr over 30 Minutes Intravenous  Once 03/26/22 1215 03/26/22 1341   03/26/22 1230  vancomycin (VANCOCIN) IVPB 1000 mg/200 mL premix       See Hyperspace for full Linked Orders Report.   1,000 mg 200 mL/hr over 60 Minutes Intravenous  Once 03/26/22 1223 03/26/22 1438        MEDICATIONS: Scheduled Meds:  heparin sodium (porcine)       allopurinol  100 mg Oral Daily   amLODipine  10 mg Oral Daily   carvedilol  25 mg Oral BID   Chlorhexidine Gluconate Cloth  6 each Topical Q0600   doxazosin  8 mg Oral QHS   ferric citrate  630 mg Oral TID WC   isosorbide mononitrate  10 mg Oral Daily   torsemide  60 mg Oral BID   Continuous Infusions:  cefTRIAXone (ROCEPHIN)  IV     vancomycin     PRN Meds:.heparin sodium (porcine), lidocaine (PF), lidocaine-prilocaine, oxyCODONE, pentafluoroprop-tetrafluoroeth   I have personally reviewed following labs and imaging studies  LABORATORY DATA: CBC: Recent Labs  Lab 03/26/22 1244 03/27/22 0235  WBC 10.1 11.8*  NEUTROABS 7.2  --   HGB 10.5* 9.7*  HCT 31.2* 28.7*  MCV 100.3* 100.0  PLT 199 782    Basic Metabolic Panel: Recent Labs  Lab 03/26/22 1244 03/27/22 0235  NA 137 137  K 4.3 3.9  CL 96* 96*  CO2 29 26  GLUCOSE 107* 146*  BUN 47* 52*  CREATININE 9.99* 11.47*  CALCIUM 8.9 9.0    GFR: Estimated Creatinine Clearance: 12.1 mL/min (A) (by C-G formula based on SCr of 11.47 mg/dL (H)).  Liver Function Tests: No results for input(s): "AST", "ALT", "ALKPHOS", "BILITOT", "PROT", "ALBUMIN" in the last 168 hours. No results for input(s): "LIPASE", "AMYLASE" in the last 168 hours. No results for input(s): "AMMONIA" in the last  168 hours.  Coagulation Profile: No results for input(s): "INR", "PROTIME" in the last 168 hours.  Cardiac Enzymes: No results for input(s): "CKTOTAL", "CKMB", "CKMBINDEX", "TROPONINI" in the last 168 hours.  BNP (last 3 results) No results for input(s): "PROBNP" in the last 8760 hours.  Lipid Profile: No results for input(s): "CHOL", "HDL", "LDLCALC", "TRIG", "CHOLHDL", "LDLDIRECT" in the last 72 hours.  Thyroid Function Tests: No results for input(s): "TSH", "T4TOTAL", "FREET4", "T3FREE", "THYROIDAB" in the last 72 hours.  Anemia Panel: No results for input(s): "VITAMINB12", "FOLATE", "FERRITIN", "TIBC", "  IRON", "RETICCTPCT" in the last 72 hours.  Urine analysis:    Component Value Date/Time   COLORURINE YELLOW 06/14/2016 1939   APPEARANCEUR CLEAR 06/14/2016 1939   LABSPEC 1.011 06/14/2016 1939   PHURINE 6.0 06/14/2016 1939   GLUCOSEU NEGATIVE 06/14/2016 1939   HGBUR NEGATIVE 06/14/2016 1939   BILIRUBINUR NEGATIVE 06/14/2016 1939   KETONESUR NEGATIVE 06/14/2016 1939   PROTEINUR 100 (A) 06/14/2016 1939   NITRITE NEGATIVE 06/14/2016 1939   LEUKOCYTESUR NEGATIVE 06/14/2016 1939    Sepsis Labs: Lactic Acid, Venous    Component Value Date/Time   LATICACIDVEN 1.2 03/26/2022 1245    MICROBIOLOGY: No results found for this or any previous visit (from the past 240 hour(s)).  RADIOLOGY STUDIES/RESULTS: CT Hand Left Wo Contrast  Result Date: 03/26/2022 CLINICAL DATA:  Soft tissue infection suspected, rule out abscess in the thenar eminence states he was picking up a wooden crates, concern for a splinter. EXAM: CT OF THE LEFT HAND WITHOUT CONTRAST TECHNIQUE: Multidetector CT imaging of the left hand was performed according to the standard protocol. Multiplanar CT image reconstructions were also generated. RADIATION DOSE REDUCTION: This exam was performed according to the departmental dose-optimization program which includes automated exposure control, adjustment of the mA  and/or kV according to patient size and/or use of iterative reconstruction technique. COMPARISON:  None Available. FINDINGS: Bones/Joint/Cartilage No evidence of fracture or dislocation distal radius ulna, carpal bones are intact and in normal alignment. No significant degenerative changes. Metacarpals and phalanges are also maintained Ligaments Suboptimally assessed by CT. Muscles and Tendons No evidence of intramuscular fluid collection or abscess in particular of the thenar eminence muscles. Tendons of the flexor and extensor compartment are intact. Soft tissues There is small density in the thenar eminence skin (axial image 89; sagittal image 69), concerning for a foreign body with skin thickening and subcutaneous soft tissue edema suggesting cellulitis. IMPRESSION: 1. Small density in the thenar eminence skin, concerning for a foreign body with surrounding skin thickening and subcutaneous soft tissue edema suggesting cellulitis. No evidence of fluid collection or abscess. 2. No evidence of intramuscular fluid collection or abscess in particular of the thenar eminence muscles. 3. No evidence of fracture or dislocation. Electronically Signed   By: Keane Police D.O.   On: 03/26/2022 13:15   DG Hand Complete Left  Result Date: 03/26/2022 CLINICAL DATA:  Swelling EXAM: LEFT HAND - COMPLETE 3+ VIEW COMPARISON:  04/16/2019 FINDINGS: There is no evidence of fracture or dislocation. Mild osteoarthritic changes of the hand and wrist. Soft tissue swelling is most notable in the region of the thenar eminence. No radiopaque foreign body. IMPRESSION: Soft tissue swelling without acute osseous abnormality. No radiopaque foreign body. Electronically Signed   By: Davina Poke D.O.   On: 03/26/2022 11:36     LOS: 0 days   Oren Binet, MD  [facility]    To contact the attending provider between 7A-7P or the covering provider during after hours 7P-7A, please log into the web site [email or web address] and  access using universal [place] password for that web site. If you do not have the password, please call the hospital operator.  03/27/2022, 10:15 AM

## 2022-03-27 NOTE — Discharge Instructions (Signed)
-  Keep affected hand clean and dry - Recommend wound was w/ soaks of left hand in warm, diluted Hibiclens solution and application of clean, dry dressing - Patient can perform wound care at home with warm, soapy water  Follow with Primary MD Lucianne Lei, MD in 7 days   Get CBC, BMP -  checked next visit with your primary MD   Activity: As tolerated with Full fall precautions use walker/cane & assistance as needed  Disposition Home   Diet: Renal diet with 1200 cc fluid restriction per day.  Special Instructions: If you have smoked or chewed Tobacco  in the last 2 yrs please stop smoking, stop any regular Alcohol  and or any Recreational drug use.  On your next visit with your primary care physician please Get Medicines reviewed and adjusted.  Please request your Prim.MD to go over all Hospital Tests and Procedure/Radiological results at the follow up, please get all Hospital records sent to your Prim MD by signing hospital release before you go home.  If you experience worsening of your admission symptoms, develop shortness of breath, life threatening emergency, suicidal or homicidal thoughts you must seek medical attention immediately by calling 911 or calling your MD immediately  if symptoms less severe.  You Must read complete instructions/literature along with all the possible adverse reactions/side effects for all the Medicines you take and that have been prescribed to you. Take any new Medicines after you have completely understood and accpet all the possible adverse reactions/side effects.

## 2022-03-27 NOTE — Consult Note (Signed)
HAND SURGERY CONSULTATION  REQUESTING PHYSICIAN: Jonetta Osgood, MD   Chief Complaint: Left hand pain  HPI: Nathan Campbell is a 47 y.o. male who is admitted with left hand cellulitis/abscess.  This issue started approximately one month ago with a splinter in the left thenar emience.  The pain and swelling worsened over the last week.  He was seen in the ER last night where he underwent bedside I&D.  He was subsequently admitted and has been on broad spectrum IV abx.  His hand pain is much improved at this point.  He has full AROM without pain.  He denies any systemic symptoms.     Past Medical History:  Diagnosis Date   Acute combined systolic and diastolic CHF, NYHA class 4 ([place]) 06/10/2016   Cardiomyopathy ([place]) 06/14/2016   CHF (congestive heart failure) ([place])    COVID 10/2020   Dyspnea    ESRD on hemodialysis ([place])    TTS at [facility]   Hypertension    Hypertensive heart and kidney disease with heart failure ([place]) 06/14/2016   Hypertensive heart disease with congestive heart failure ([place])    Obesity    Past Surgical History:  Procedure Laterality Date   AV FISTULA PLACEMENT Left 11/11/2020   Procedure: LEFT ARM First Stage Basilic Vein Fistula Creation;  Surgeon: Waynetta Sandy, MD;  Location: [facility];  Service: Vascular;  Laterality: Left;   [place] Left 01/23/2021   Procedure: Second Stage Basilic Vein Transposition of Left Arm Arteriovenous  Fistula;  Surgeon: Waynetta Sandy, MD;  Location: [facility];  Service: Vascular;  Laterality: Left;  Block  to LMA    COLONOSCOPY     NO PAST SURGERIES     Social History   Socioeconomic History   Marital status: Single    Spouse name: Not on file   Number of children: Not on file   Years of education: Not on file   Highest education level: Not on file  Occupational History   Not on file  Tobacco Use   Smoking status: Former    Packs/day: 0.00    Years: 5.00    Total pack years:  0.00    Types: Cigarettes    Quit date: 09/2020    Years since quitting: 1.5   Smokeless tobacco: Never   Tobacco comments:    occasionally  Vaping Use   Vaping Use: Some days  Substance and Sexual Activity   Alcohol use: Not Currently   Drug use: Not Currently   Sexual activity: Not on file  Other Topics Concern   Not on file  Social History Narrative   Epworth Sleepiness Score: 6   Social Determinants of Health   Financial Resource Strain: Not on file  Food Insecurity: No Food Insecurity (03/26/2022)   Hunger Vital Sign    Worried About Running Out of Food in the Last Year: Never true    Ran Out of Food in the Last Year: Never true  Transportation Needs: No Transportation Needs (03/26/2022)   PRAPARE - Hydrologist (Medical): No    Lack of Transportation (Non-Medical): No  Physical Activity: Not on file  Stress: Not on file  Social Connections: Not on file   Family History  Problem Relation Age of Onset   Hypertension Mother    Diabetes Father    Hypertension Father    Hypertension Sister    Hypertension Paternal Grandmother    Diabetes Mellitus II Paternal [employer]chant [employer] officer  Heart disease Cousin    Heart disease Cousin    Colon cancer Cousin    Esophageal cancer Neg Hx    Rectal cancer Neg Hx    Stomach cancer Neg Hx    - negative except otherwise stated in the family history section No Known Allergies Prior to Admission medications   Medication Sig Start Date End Date Taking? Authorizing Provider  allopurinol (ZYLOPRIM) 100 MG tablet Take 1 tablet (100 mg total) by mouth daily. Do not begin until pain in right knee is resolved. 09/13/21  Yes Lynden Oxford Scales, PA-C  amLODipine (NORVASC) 10 MG tablet Take 1 tablet (10 mg total) by mouth daily. 06/16/16  Yes Elmahi, Rae Lips, MD  AURYXIA 1 GM 210 MG(Fe) tablet Take 630 mg by mouth 3 (three) times daily with meals. Take 1 tablet with snacks. 03/18/22  Yes [provider]  B  Complex-C-Zn-Folic Acid (DIALYVITE 094 WITH ZINC) 0.8 MG TABS Take 1 tablet by mouth at bedtime. 02/16/22  Yes [provider]  carvedilol (COREG) 25 MG tablet Take 25 mg by mouth 2 (two) times daily. 12/13/21  Yes [provider]  doxazosin (CARDURA) 8 MG tablet Take 8 mg by mouth at bedtime. 11/03/17  Yes [provider]  doxycycline (VIBRAMYCIN) 100 MG capsule Take 1 capsule (100 mg total) by mouth 2 (two) times daily. 03/25/22  Yes Jaynee Eagles, PA-C  isosorbide mononitrate (ISMO) 10 MG tablet Take 10 mg by mouth daily. 03/11/22  Yes [provider]  torsemide (DEMADEX) 20 MG tablet Take 60 mg by mouth 2 (two) times daily. 10/08/20  Yes [provider]  diclofenac Sodium (VOLTAREN) 1 % GEL Apply 4 g topically 4 (four) times daily. Apply to affected areas 4 times daily as needed for pain. Patient not taking: Reported on 03/26/2022 09/13/21   Lynden Oxford Scales, PA-C   CT Hand Left Wo Contrast  Result Date: 03/26/2022 CLINICAL DATA:  Soft tissue infection suspected, rule out abscess in the thenar eminence states he was picking up a wooden crates, concern for a splinter. EXAM: CT OF THE LEFT HAND WITHOUT CONTRAST TECHNIQUE: Multidetector CT imaging of the left hand was performed according to the standard protocol. Multiplanar CT image reconstructions were also generated. RADIATION DOSE REDUCTION: This exam was performed according to the departmental dose-optimization program which includes automated exposure control, adjustment of the mA and/or kV according to patient size and/or use of iterative reconstruction technique. COMPARISON:  None Available. FINDINGS: Bones/Joint/Cartilage No evidence of fracture or dislocation distal radius ulna, carpal bones are intact and in normal alignment. No significant degenerative changes. Metacarpals and phalanges are also maintained Ligaments Suboptimally assessed by CT. Muscles and Tendons No evidence of intramuscular fluid  collection or abscess in particular of the thenar eminence muscles. Tendons of the flexor and extensor compartment are intact. Soft tissues There is small density in the thenar eminence skin (axial image 89; sagittal image 69), concerning for a foreign body with skin thickening and subcutaneous soft tissue edema suggesting cellulitis. IMPRESSION: 1. Small density in the thenar eminence skin, concerning for a foreign body with surrounding skin thickening and subcutaneous soft tissue edema suggesting cellulitis. No evidence of fluid collection or abscess. 2. No evidence of intramuscular fluid collection or abscess in particular of the thenar eminence muscles. 3. No evidence of fracture or dislocation. Electronically Signed   By: Keane Police D.O.   On: 03/26/2022 13:15   DG Hand Complete Left  Result Date: 03/26/2022 CLINICAL DATA:  Swelling EXAM:  LEFT HAND - COMPLETE 3+ VIEW COMPARISON:  04/16/2019 FINDINGS: There is no evidence of fracture or dislocation. Mild osteoarthritic changes of the hand and wrist. Soft tissue swelling is most notable in the region of the thenar eminence. No radiopaque foreign body. IMPRESSION: Soft tissue swelling without acute osseous abnormality. No radiopaque foreign body. Electronically Signed   By: Davina Poke D.O.   On: 03/26/2022 11:36   - Positive ROS: All other systems have been reviewed and were otherwise negative with the exception of those mentioned in the HPI and as above.  Physical Exam: General: No acute distress, resting comfortably Cardiovascular: BUE warm and well perfused, normal rate Respiratory: Normal WOB on RA Skin: Warm and dry Neurologic: Sensation intact distally   Left Upper Extremity  Minimal swelling centered on thenar eminence with no surrounding erythema.  Approx 1-1.5 cm wound directly over thenar eminence.  No drainage and no expressible purulence.  Full AROM of all fingers, including thumb.  SILT m/u/r distribution.  Hand warm and well  perfused w/ BCR.    Assessment: 47 yo M w/ left hand abscess over thenar eminence s/p bedside I&D in ER with much improved pain and swelling.  Wound is clean and dry without expressible purulence.   Plan: - No further surgical plans - Recommend wound was w/ soaks of left hand in warm, diluted Hibiclens solution and application of clean, dry dressing - Patient can perform wound care at home with warm, soapy water - Patient can follow up with me in 7-10 days  Thank you for the consult and the opportunity to see Nathan Campbell, M.D. [facility] 2:39 PM

## 2022-03-28 DIAGNOSIS — L02519 Cutaneous abscess of unspecified hand: Secondary | ICD-10-CM | POA: Diagnosis not present

## 2022-03-28 DIAGNOSIS — L03119 Cellulitis of unspecified part of limb: Secondary | ICD-10-CM | POA: Diagnosis not present

## 2022-03-28 LAB — CBC
HCT: 31.5 % — ABNORMAL LOW (ref 39.0–52.0)
Hemoglobin: 10.7 g/dL — ABNORMAL LOW (ref 13.0–17.0)
MCH: 33.9 pg (ref 26.0–34.0)
MCHC: 34 g/dL (ref 30.0–36.0)
MCV: 99.7 fL (ref 80.0–100.0)
Platelets: 213 10*3/uL (ref 150–400)
RBC: 3.16 MIL/uL — ABNORMAL LOW (ref 4.22–5.81)
RDW: 12.4 % (ref 11.5–15.5)
WBC: 8.4 10*3/uL (ref 4.0–10.5)
nRBC: 0 % (ref 0.0–0.2)

## 2022-03-28 LAB — HEPATITIS B SURFACE ANTIBODY, QUANTITATIVE: Hep B S AB Quant (Post): 143.4 m[IU]/mL (ref >9.9)

## 2022-03-28 MED ORDER — CEPHALEXIN 500 MG PO CAPS
500.0000 mg | ORAL_CAPSULE | Freq: Two times a day (BID) | ORAL | Status: DC
  Administered 2022-03-28: 500 mg via ORAL
  Filled 2022-03-28: qty 1

## 2022-03-28 MED ORDER — VANCOMYCIN HCL IN DEXTROSE 1-5 GM/200ML-% IV SOLN: 1000.0000 mg | INTRAVENOUS | Status: AC

## 2022-03-28 MED ORDER — CEPHALEXIN 500 MG PO CAPS: 500.0000 mg | ORAL_CAPSULE | Freq: Two times a day (BID) | ORAL | 0 refills | Status: DC

## 2022-03-28 NOTE — TOC Transition Note (Signed)
Transition of Care [facility]) - CM/SW Discharge Note   Patient Details  Name: Nathan Campbell MRN: [id number] Date of Birth: 1975-08-17  Transition of Care [facility] CM/SW Contact:  Carles Collet, RN Phone Number: 03/28/2022, 10:03 AM   Clinical Narrative:     Spoke with patient to discuss home health services.  PT OT RN for dressing changes ordered. Patient did have preference for agency, [facility] able to accept.  CM  modified HH order to include dressing change instructions from attending.  Patient has family who will provide transportation home No other TOC needs identified for DC.    Final next level of care: Home w Home Health Services Barriers to Discharge: No Barriers Identified   Patient Goals and CMS Choice CMS Medicare.gov Compare Post Acute Care list provided to:: Patient Choice offered to / list presented to : Patient  Discharge Placement                         Discharge Plan and Services Additional resources added to the After Visit Summary for                  DME Arranged: N/A         HH Arranged: PT, OT, RN [facility] Agency: [place] Date [place]: 03/28/22 Time [place]: 1003 Representative spoke with at [place]: [place] Determinants of Health ([place]) Interventions SDOH Screenings   Food Insecurity: No Food Insecurity (03/26/2022)  Housing: Low Risk  (03/26/2022)  Transportation Needs: No Transportation Needs (03/26/2022)  Utilities: Not At Risk (03/26/2022)  Tobacco Use: Medium Risk (03/27/2022)     Readmission Risk Interventions     No data to display

## 2022-03-28 NOTE — Progress Notes (Signed)
[facility] Progress Note   Subjective:    Seen and examined at bedside. Patient going home today.   Objective Vitals:   03/27/22 1338 03/27/22 1542 03/28/22 0249 03/28/22 0824  BP: (!) 142/93 131/84 128/87 (!) 144/100  Pulse: 99 88 87 100  Resp: '18 18 18 17  '$ Temp:  99.1 F (37.3 C) 98.6 F (37 C) 98.7 F (37.1 C)  TempSrc:  Oral Oral Oral  SpO2: 99% 97% 91% 93%  Weight:      Height:       Physical Exam General: Awake, alert, NAD, on RA Heart: S1 and S2; No MRGs Lungs: CTA bilaterally Abdomen: Soft and non-tender Extremities:No LE edema Dialysis Access: L AVF (+) B/T   Filed Weights   03/26/22 1200 03/27/22 1230  Weight: (!) 142.9 kg (!) 138.2 kg    Intake/Output Summary (Last 24 hours) at 03/28/2022 1139 Last data filed at 03/28/2022 0918 Gross per 24 hour  Intake 1020 ml  Output 3702 ml  Net -2682 ml    Additional Objective Labs: Basic Metabolic Panel: Recent Labs  Lab 03/26/22 1244 03/27/22 0235  NA 137 137  K 4.3 3.9  CL 96* 96*  CO2 29 26  GLUCOSE 107* 146*  BUN 47* 52*  CREATININE 9.99* 11.47*  CALCIUM 8.9 9.0  PHOS  --  5.4*   Liver Function Tests: Recent Labs  Lab 03/27/22 0235  ALBUMIN 3.4*   No results for input(s): "LIPASE", "AMYLASE" in the last 168 hours. CBC: Recent Labs  Lab 03/26/22 1244 03/27/22 0235 03/28/22 0258  WBC 10.1 11.8* 8.4  NEUTROABS 7.2  --   --   HGB 10.5* 9.7* 10.7*  HCT 31.2* 28.7* 31.5*  MCV 100.3* 100.0 99.7  PLT 199 188 213   Blood Culture No results found for: "SDES", "SPECREQUEST", "CULT", "REPTSTATUS"  Cardiac Enzymes: No results for input(s): "CKTOTAL", "CKMB", "CKMBINDEX", "TROPONINI" in the last 168 hours. CBG: No results for input(s): "GLUCAP" in the last 168 hours. Iron Studies: No results for input(s): "IRON", "TIBC", "TRANSFERRIN", "FERRITIN" in the last 72 hours. No results found for: "INR", "PROTIME" Studies/Results: CT Hand Left Wo Contrast  Result Date:  03/26/2022 CLINICAL DATA:  Soft tissue infection suspected, rule out abscess in the thenar eminence states he was picking up a wooden crates, concern for a splinter. EXAM: CT OF THE LEFT HAND WITHOUT CONTRAST TECHNIQUE: Multidetector CT imaging of the left hand was performed according to the standard protocol. Multiplanar CT image reconstructions were also generated. RADIATION DOSE REDUCTION: This exam was performed according to the departmental dose-optimization program which includes automated exposure control, adjustment of the mA and/or kV according to patient size and/or use of iterative reconstruction technique. COMPARISON:  None Available. FINDINGS: Bones/Joint/Cartilage No evidence of fracture or dislocation distal radius ulna, carpal bones are intact and in normal alignment. No significant degenerative changes. Metacarpals and phalanges are also maintained Ligaments Suboptimally assessed by CT. Muscles and Tendons No evidence of intramuscular fluid collection or abscess in particular of the thenar eminence muscles. Tendons of the flexor and extensor compartment are intact. Soft tissues There is small density in the thenar eminence skin (axial image 89; sagittal image 69), concerning for a foreign body with skin thickening and subcutaneous soft tissue edema suggesting cellulitis. IMPRESSION: 1. Small density in the thenar eminence skin, concerning for a foreign body with surrounding skin thickening and subcutaneous soft tissue edema suggesting cellulitis. No evidence of fluid collection or abscess. 2. No evidence of intramuscular fluid collection  or abscess in particular of the thenar eminence muscles. 3. No evidence of fracture or dislocation. Electronically Signed   By: Keane Police D.O.   On: 03/26/2022 13:15    Medications:  vancomycin Stopped (03/27/22 1324)    allopurinol  100 mg Oral Daily   amLODipine  10 mg Oral Daily   carvedilol  25 mg Oral BID   cephALEXin  500 mg Oral Q12H    Chlorhexidine Gluconate Cloth  6 each Topical Q0600   doxazosin  8 mg Oral QHS   [START ON 03/30/2022] doxercalciferol  2 mcg Intravenous Q T,Th,Sa-HD   ferric citrate  630 mg Oral TID WC   isosorbide mononitrate  10 mg Oral Daily   torsemide  60 mg Oral BID    Dialysis Orders: [facility]  4h 67mn  500/600  138.5kg  2/2 bath  LUA  AVF  Hep 4000 - hectorol 2 ug IV tiw - last HD 1/11, post wt 138.7kg, mod large IDWG's - last Hb 11.4, no esa  Assessment/Plan: L hand infection - cellulitis, poss abscess per CT.  Started on IV abx, and pt had I&D in the ED 1/12.  Plan for 1GM Vanc IV with HD X 2 weeks (end date 04/10/22) and 10 days oral Keflex. ESRD - on HD TTS. He will resume HD in outpatient HD center.  HTN/ volume - no gross vol excess on exam, and is on RA Anemia esrd - Hb 9-10.5 here, not on esa. Follow.  MBD ckd - Ca in range, will add on phos. Cont IV vdra and auryxia as binder H/o combined CHF Dispo - okay for discharge from renal standpoint. He will resume HD on 1/16 at his outpatient HD center.  CTobie Poet NP [facility] 03/28/2022,11:39 AM  LOS: 1 day

## 2022-03-28 NOTE — Discharge Summary (Signed)
Nathan Campbell ZOX:[id number] DOB: 29-Apr-1975 DOA: 03/26/2022  PCP: Lucianne Lei, MD  Admit date: 03/26/2022  Discharge date: 03/28/2022  Admitted From: Home   Disposition:  Home   Recommendations for Outpatient Follow-up:   Follow up with PCP in 1-2 weeks  PCP Please obtain BMP/CBC, 2 view CXR in 1week,  (see Discharge instructions)   PCP Please follow up on the following pending results:    Home Health: None   Equipment/Devices: None  Consultations: Renal, Hand Surgery Discharge Condition: Stable    CODE STATUS: Full    Diet Recommendation: Renal, 1200 cc/ fluid restriction/ day    Chief Complaint  Patient presents with   Cellulitis    left     Brief history of present illness from the day of admission and additional interim summary    47 y.o.  male with a history of ESRD on HD TTS, chronic HFpEF, who presented with left hand abscess-s/p I&D in the ED-subsequently admitted to the hospitalist service for IV antibiotics/further evaluation.   Significant events: 1/12>> presented with left hand abscess-s/p I&D by ED MD.  Admit to [facility].   Significant studies: 1/12>> CT left hand: Small density in the thenar eminence-concerning for foreign body with surrounding skin thickening/subcutaneous soft tissue edema suggesting cellulitis.  No evidence of abscess.   Significant microbiology data: None   Procedures: 1/12>> I&D by ED MD                                                                   Hospital Course   Left hand abscess-s/p I&D by ED MD on 1/12 Per patient report-his left hand is less swollen/tender/painful this morning. Cultures never sent however MRSA nasal PCR negative.  He was seen by hand surgeon Dr. Tempie Donning underwent incision and drainage on 03/27/2022, stable for discharge per hand  surgery, currently symptom-free will be getting 2 weeks of IV vancomycin with dialysis treatments along with 10 days of oral Keflex upon discharge.  Wound care orders for home placed.  Patient symptom-free eager to go home today.     ESRD on HD TTS Nephrology following   Normocytic anemia Secondary to ESRD Follow Hb periodically   Chronic HFpEF Euvolemic Volume removal with HD   HTN BP stable Continue amlodipine/Coreg/Imdur   Gout Not in flare Continue allopurinol   Morbid Obesity:  Estimated body mass index is 42.72 kg/m , follow with PCP     Discharge diagnosis     Principal Problem:   Cellulitis and abscess of hand Active Problems:   Hypertensive heart disease with CHF (congestive heart failure) (HCC)   ESRD (end stage renal disease) ([place])   Hand abscess    Discharge instructions    Discharge Instructions     Discharge instructions   Complete by: As directed    -  Keep affected hand clean and dry - Recommend wound was w/ soaks of left hand in warm, diluted Hibiclens solution and application of clean, dry dressing - Patient can perform wound care at home with warm, soapy water  Follow with Primary MD Lucianne Lei, MD in 7 days   Get CBC, BMP -  checked next visit with your primary MD   Activity: As tolerated with Full fall precautions use walker/cane & assistance as needed  Disposition Home   Diet: Renal diet with 1200 cc fluid restriction per day.  Special Instructions: If you have smoked or chewed Tobacco  in the last 2 yrs please stop smoking, stop any regular Alcohol  and or any Recreational drug use.  On your next visit with your primary care physician please Get Medicines reviewed and adjusted.  Please request your Prim.MD to go over all Hospital Tests and Procedure/Radiological results at the follow up, please get all Hospital records sent to your Prim MD by signing hospital release before you go home.  If you experience worsening of your  admission symptoms, develop shortness of breath, life threatening emergency, suicidal or homicidal thoughts you must seek medical attention immediately by calling 911 or calling your MD immediately  if symptoms less severe.  You Must read complete instructions/literature along with all the possible adverse reactions/side effects for all the Medicines you take and that have been prescribed to you. Take any new Medicines after you have completely understood and accpet all the possible adverse reactions/side effects.   Discharge wound care:   Complete by: As directed    - Recommend wound was w/ soaks of left hand in warm, diluted Hibiclens solution and application of clean, dry dressing - Patient can perform wound care at home with warm, soapy water   Discharge wound care:   Complete by: As directed    - Keep affected hand clean and dry - Recommend wound was w/ soaks of left hand in warm, diluted Hibiclens solution and application of clean, dry dressing - Patient can perform wound care at home with warm, soapy water   Increase activity slowly   Complete by: As directed        Discharge Medications   Allergies as of 03/28/2022   No Known Allergies      Medication List     STOP taking these medications    diclofenac Sodium 1 % Gel Commonly known as: VOLTAREN   doxycycline 100 MG capsule Commonly known as: VIBRAMYCIN       TAKE these medications    allopurinol 100 MG tablet Commonly known as: ZYLOPRIM Take 1 tablet (100 mg total) by mouth daily. Do not begin until pain in right knee is resolved.   amLODipine 10 MG tablet Commonly known as: NORVASC Take 1 tablet (10 mg total) by mouth daily.   Auryxia 1 GM 210 MG(Fe) tablet Generic drug: ferric citrate Take 630 mg by mouth 3 (three) times daily with meals. Take 1 tablet with snacks.   carvedilol 25 MG tablet Commonly known as: COREG Take 25 mg by mouth 2 (two) times daily.   cephALEXin 500 MG capsule Commonly known  as: KEFLEX Take 1 capsule (500 mg total) by mouth every 12 (twelve) hours.   DIALYVITE 800 WITH ZINC 0.8 MG Tabs Take 1 tablet by mouth at bedtime.   doxazosin 8 MG tablet Commonly known as: CARDURA Take 8 mg by mouth at bedtime.   isosorbide mononitrate 10 MG tablet Commonly known as: ISMO Take 10  mg by mouth daily.   torsemide 20 MG tablet Commonly known as: DEMADEX Take 60 mg by mouth 2 (two) times daily.   vancomycin 1-5 GM/200ML-% Soln Commonly known as: VANCOCIN Inject 200 mLs (1,000 mg total) into the vein Every Tuesday,Thursday,and Saturday with dialysis for 6 doses. Start taking on: March 30, 2022               Discharge Care Instructions  (From admission, onward)           Start     Ordered   03/28/22 0000  Discharge wound care:       Comments: - Keep affected hand clean and dry - Recommend wound was w/ soaks of left hand in warm, diluted Hibiclens solution and application of clean, dry dressing - Patient can perform wound care at home with warm, soapy water   03/28/22 0954   03/27/22 0000  Discharge wound care:       Comments: - Recommend wound was w/ soaks of left hand in warm, diluted Hibiclens solution and application of clean, dry dressing - Patient can perform wound care at home with warm, soapy water   03/27/22 1809             Follow-up Information     Lucianne Lei, MD. Schedule an appointment as soon as possible for a visit in 1 week(s).   Specialty: Family Medicine Contact information: [address] [place] [address] [phone]         Sherilyn Cooter, MD. Schedule an appointment as soon as possible for a visit in 1 week(s).   Specialty: Orthopedic Surgery Contact information: [address] [place] [address] [phone]                 Major procedures and Radiology Reports - PLEASE review detailed and final reports thoroughly  -      CT Hand Left Wo Contrast  Result Date:  03/26/2022 CLINICAL DATA:  Soft tissue infection suspected, rule out abscess in the thenar eminence states he was picking up a wooden crates, concern for a splinter. EXAM: CT OF THE LEFT HAND WITHOUT CONTRAST TECHNIQUE: Multidetector CT imaging of the left hand was performed according to the standard protocol. Multiplanar CT image reconstructions were also generated. RADIATION DOSE REDUCTION: This exam was performed according to the departmental dose-optimization program which includes automated exposure control, adjustment of the mA and/or kV according to patient size and/or use of iterative reconstruction technique. COMPARISON:  None Available. FINDINGS: Bones/Joint/Cartilage No evidence of fracture or dislocation distal radius ulna, carpal bones are intact and in normal alignment. No significant degenerative changes. Metacarpals and phalanges are also maintained Ligaments Suboptimally assessed by CT. Muscles and Tendons No evidence of intramuscular fluid collection or abscess in particular of the thenar eminence muscles. Tendons of the flexor and extensor compartment are intact. Soft tissues There is small density in the thenar eminence skin (axial image 89; sagittal image 69), concerning for a foreign body with skin thickening and subcutaneous soft tissue edema suggesting cellulitis. IMPRESSION: 1. Small density in the thenar eminence skin, concerning for a foreign body with surrounding skin thickening and subcutaneous soft tissue edema suggesting cellulitis. No evidence of fluid collection or abscess. 2. No evidence of intramuscular fluid collection or abscess in particular of the thenar eminence muscles. 3. No evidence of fracture or dislocation. Electronically Signed   By: Keane Police D.O.   On: 03/26/2022 13:15   DG Hand Complete  Left  Result Date: 03/26/2022 CLINICAL DATA:  Swelling EXAM: LEFT HAND - COMPLETE 3+ VIEW COMPARISON:  04/16/2019 FINDINGS: There is no evidence of fracture or dislocation.  Mild osteoarthritic changes of the hand and wrist. Soft tissue swelling is most notable in the region of the thenar eminence. No radiopaque foreign body. IMPRESSION: Soft tissue swelling without acute osseous abnormality. No radiopaque foreign body. Electronically Signed   By: Davina Poke D.O.   On: 03/26/2022 11:36    Micro Results    No results found for this or any previous visit (from the past 240 hour(s)).  Today   Subjective    Harout Scheurich today has no headache,no chest abdominal pain,no new weakness tingling or numbness, feels much better wants to go home today.    Objective   Blood pressure 140/90, pulse 100, temperature 98.7 F (37.1 C), temperature source Oral, resp. rate 17, height 6' (1.829 m), weight (!) 138.2 kg, SpO2 93 %.   Intake/Output Summary (Last 24 hours) at 03/28/2022 0954 Last data filed at 03/28/2022 0918 Gross per 24 hour  Intake 1020 ml  Output 3702 ml  Net -2682 ml    Exam  Awake Alert, No new F.N deficits,    [place].AT,PERRAL Supple Neck,   Symmetrical Chest wall movement, Good air movement bilaterally, CTAB RRR,No Gallops,   +ve B.Sounds, Abd Soft, Non tender,  L.Arm in bandage   Data Review   Recent Labs  Lab 03/26/22 1244 03/27/22 0235 03/28/22 0258  WBC 10.1 11.8* 8.4  HGB 10.5* 9.7* 10.7*  HCT 31.2* 28.7* 31.5*  PLT 199 188 213  MCV 100.3* 100.0 99.7  MCH 33.8 33.8 33.9  MCHC 33.7 33.8 34.0  RDW 12.6 12.5 12.4  LYMPHSABS 1.6  --   --   MONOABS 1.2*  --   --   EOSABS 0.1  --   --   BASOSABS 0.0  --   --     Recent Labs  Lab 03/26/22 1244 03/26/22 1245 03/27/22 0235  NA 137  --  137  K 4.3  --  3.9  CL 96*  --  96*  CO2 29  --  26  ANIONGAP 12  --  15  GLUCOSE 107*  --  146*  BUN 47*  --  52*  CREATININE 9.99*  --  11.47*  ALBUMIN  --   --  3.4*  LATICACIDVEN  --  1.2  --   CALCIUM 8.9  --  9.0     Total Time in preparing paper work, data evaluation and todays exam - 35 minutes  Signature  -    Lala Lund M.D on 03/28/2022 at 9:54 AM   -  To page go to [email or web address]

## 2022-03-28 NOTE — Progress Notes (Signed)
Provided discharge teaching about wound care. CHG solution provided to patient. Instructed on daily wound soaks, and to apply bandage. Pt verbalized understanding and taught back wound care instructions.

## 2022-03-29 ENCOUNTER — Telehealth: Payer: Self-pay | Admitting: Nephrology

## 2022-03-29 NOTE — Telephone Encounter (Signed)
Transition of care contact from inpatient facility  Date of discharge: 03/28/2022 Date of contact: 03/29/2022 Method: Phone Spoke to: Patient  Patient contacted to discuss transition of care from recent inpatient hospitalization. Patient was admitted to [facility] from 1/12-1/14/24 with discharge diagnosis of left hand abscess.   Medication changes were reviewed. To continue antibiotics at dialysis   Patient will follow up with his/her outpatient HD unit on: Tuesday Jan 16.

## 2022-04-14 ENCOUNTER — Ambulatory Visit (INDEPENDENT_AMBULATORY_CARE_PROVIDER_SITE_OTHER): Payer: Medicare Other | Admitting: Vascular Surgery

## 2022-04-14 ENCOUNTER — Encounter: Payer: Self-pay | Admitting: Vascular Surgery

## 2022-04-14 VITALS — BP 138/96 | HR 93 | Temp 98.0°F | Resp 20 | Ht 72.0 in | Wt 311.0 lb

## 2022-04-14 DIAGNOSIS — N186 End stage renal disease: Secondary | ICD-10-CM

## 2022-04-14 NOTE — Progress Notes (Signed)
 Patient ID: Nathan Campbell, male   DOB: 07/09/1975, 47 y.o.   MRN: [id number]  Reason for Consult: Follow-up   Referred by Collins, Samantha G, PA*  Subjective:     HPI:  Koua Campbell is a 47 y.o. male with end-stage renal disease and a history of two-stage basilic vein fistula creation on the left with the last procedure being just over 1 year ago.  Currently dialyzing on Tuesdays, Thursdays and Saturdays.  Does not take any blood thinners.  He has been on dialysis for 1 year using the fistula now has 1 large area of pseudoaneurysmal degeneration which is quite painful for him.  He has not had any bleeding issues.  Past Medical History:  Diagnosis Date   Acute combined systolic and diastolic CHF, NYHA class 4 (HCC) 06/10/2016   Cardiomyopathy (HCC) 06/14/2016   CHF (congestive heart failure) (HCC)    COVID 10/2020   Dyspnea    ESRD on hemodialysis (HCC)    TTS at [facility]   Hypertension    Hypertensive heart and kidney disease with heart failure (HCC) 06/14/2016   Hypertensive heart disease with congestive heart failure (HCC)    Obesity    Family History  Problem Relation Age of Onset   Hypertension Mother    Diabetes Father    Hypertension Father    Hypertension Sister    Hypertension Paternal Grandmother    Diabetes Mellitus II Paternal Grandfather    Heart disease Cousin    Heart disease Cousin    Colon cancer Cousin    Esophageal cancer Neg Hx    Rectal cancer Neg Hx    Stomach cancer Neg Hx    Past Surgical History:  Procedure Laterality Date   AV FISTULA PLACEMENT Left 11/11/2020   Procedure: LEFT ARM First Stage Basilic Vein Fistula Creation;  Surgeon: Brileigh Sevcik Christopher, MD;  Location: [facility] OR;  Service: Vascular;  Laterality: Left;   BASCILIC VEIN TRANSPOSITION Left 01/23/2021   Procedure: Second Stage Basilic Vein Transposition of Left Arm Arteriovenous  Fistula;  Surgeon: Morgaine Kimball Christopher, MD;  Location: [facility] OR;  Service: Vascular;   Laterality: Left;  Block  to LMA    COLONOSCOPY     NO PAST SURGERIES      Short Social History:  Social History   Tobacco Use   Smoking status: Former    Packs/day: 0.00    Years: 5.00    Total pack years: 0.00    Types: Cigarettes    Quit date: 09/2020    Years since quitting: 1.5   Smokeless tobacco: Never   Tobacco comments:    occasionally  Substance Use Topics   Alcohol use: Not Currently    No Known Allergies  Current Outpatient Medications  Medication Sig Dispense Refill   allopurinol (ZYLOPRIM) 100 MG tablet Take 1 tablet (100 mg total) by mouth daily. Do not begin until pain in right knee is resolved. 30 tablet 1   amLODipine (NORVASC) 10 MG tablet Take 1 tablet (10 mg total) by mouth daily. 30 tablet 1   AURYXIA 1 GM 210 MG(Fe) tablet Take 630 mg by mouth 3 (three) times daily with meals. Take 1 tablet with snacks.     B Complex-C-Zn-Folic Acid (DIALYVITE 800 WITH ZINC) 0.8 MG TABS Take 1 tablet by mouth at bedtime.     carvedilol (COREG) 25 MG tablet Take 25 mg by mouth 2 (two) times daily.     cephALEXin (KEFLEX) 500 MG capsule Take 1 capsule (  500 mg total) by mouth every 12 (twelve) hours. 20 capsule 0   doxazosin (CARDURA) 8 MG tablet Take 8 mg by mouth at bedtime.  5   isosorbide mononitrate (ISMO) 10 MG tablet Take 10 mg by mouth daily.     torsemide (DEMADEX) 20 MG tablet Take 60 mg by mouth 2 (two) times daily.     No current facility-administered medications for this visit.    Review of Systems  Constitutional:  Constitutional negative. HENT: HENT negative.  Eyes: Eyes negative.  Respiratory: Respiratory negative.  Cardiovascular: Cardiovascular negative.  GI: Gastrointestinal negative.  Musculoskeletal: Musculoskeletal negative.  Skin: Skin negative.  Neurological: Neurological negative. Hematologic: Hematologic/lymphatic negative.  Psychiatric: Psychiatric negative.        Objective:  Objective  Vitals:   04/14/22 0940  BP: (!) 138/96   Pulse: 93  Resp: 20  Temp: 98 F (36.7 C)  SpO2: 92%      Physical Exam HENT:     Nose: Nose normal.  Eyes:     Pupils: Pupils are equal, round, and reactive to light.  Cardiovascular:     Rate and Rhythm: Normal rate.     Pulses: Normal pulses.  Pulmonary:     Effort: Pulmonary effort is normal.  Abdominal:     General: Abdomen is flat.  Musculoskeletal:        General: Normal range of motion.     Cervical back: Normal range of motion.     Right lower leg: No edema.     Left lower leg: No edema.     Comments: There is a large pseudoaneurysm in the mid segment of the left arm fistula, there is a strong thrill in the fistula  Skin:    General: Skin is warm.     Capillary Refill: Capillary refill takes less than 2 seconds.  Neurological:     General: No focal deficit present.     Mental Status: He is alert.  Psychiatric:        Mood and Affect: Mood normal.        Thought Content: Thought content normal.        Judgment: Judgment normal.     Data: No studies     Assessment/Plan:    47-year-old male on dialysis via left arm to stage basilic vein fistula on Tuesdays, Thursdays and Saturdays.  He now has 1 area of pseudoaneurysmal degeneration which is large and quite bothersome to him and he would like to have this repaired.  I discussed first proceeding with fistulogram on Monday and as long as this appears satisfactory we can proceed on a likely Friday and plan for revision of the fistula and placement of catheter to allow the fistula to heal.  Patient demonstrates good understanding we will get this scheduled on a nondialysis day in the near future.     Ariyan Sinnett Christopher Caroline Matters MD [facility]   

## 2022-04-14 NOTE — H&P (View-Only) (Signed)
Patient ID: Nathan Campbell, male   DOB: 10/03/1975, 47 y.o.   MRN: [id number]  Reason for Consult: Follow-up   Referred by Janalee Dane, PA*  Subjective:     HPI:  Nathan Campbell is a 47 y.o. male with end-stage renal disease and a history of two-stage basilic vein fistula creation on the left with the last procedure being just over 1 year ago.  Currently dialyzing on Tuesdays, Thursdays and Saturdays.  Does not take any blood thinners.  He has been on dialysis for 1 year using the fistula now has 1 large area of pseudoaneurysmal degeneration which is quite painful for him.  He has not had any bleeding issues.  Past Medical History:  Diagnosis Date   Acute combined systolic and diastolic CHF, NYHA class 4 (HCC) 06/10/2016   Cardiomyopathy ([place]) 06/14/2016   CHF (congestive heart failure) ([place])    COVID 10/2020   Dyspnea    ESRD on hemodialysis (HCC)    [facility]   Hypertension    Hypertensive heart and kidney disease with heart failure ([place]) 06/14/2016   Hypertensive heart disease with congestive heart failure ([place])    Obesity    Family History  Problem Relation Age of Onset   Hypertension Mother    Diabetes Father    Hypertension Father    Hypertension Sister    Hypertension Paternal Grandmother    Diabetes Mellitus II Paternal Grandfather    Heart disease Cousin    Heart disease Cousin    Colon cancer Cousin    Esophageal cancer Neg Hx    Rectal cancer Neg Hx    Stomach cancer Neg Hx    Past Surgical History:  Procedure Laterality Date   AV FISTULA PLACEMENT Left 11/11/2020   Procedure: LEFT ARM First Stage Basilic Vein Fistula Creation;  Surgeon: Waynetta Sandy, MD;  Location: [place];  Service: Vascular;  Laterality: Left;   [place] Left 01/23/2021   Procedure: Second Stage Basilic Vein Transposition of Left Arm Arteriovenous  Fistula;  Surgeon: Waynetta Sandy, MD;  Location: [place];  Service: Vascular;   Laterality: Left;  Block  to LMA    COLONOSCOPY     NO PAST SURGERIES      Short Social History:  Social History   Tobacco Use   Smoking status: Former    Packs/day: 0.00    Years: 5.00    Total pack years: 0.00    Types: Cigarettes    Quit date: 09/2020    Years since quitting: 1.5   Smokeless tobacco: Never   Tobacco comments:    occasionally  Substance Use Topics   Alcohol use: Not Currently    No Known Allergies  Current Outpatient Medications  Medication Sig Dispense Refill   allopurinol (ZYLOPRIM) 100 MG tablet Take 1 tablet (100 mg total) by mouth daily. Do not begin until pain in right knee is resolved. 30 tablet 1   amLODipine (NORVASC) 10 MG tablet Take 1 tablet (10 mg total) by mouth daily. 30 tablet 1   AURYXIA 1 GM 210 MG(Fe) tablet Take 630 mg by mouth 3 (three) times daily with meals. Take 1 tablet with snacks.     B Complex-C-Zn-Folic Acid (DIALYVITE Q000111Q WITH ZINC) 0.8 MG TABS Take 1 tablet by mouth at bedtime.     carvedilol (COREG) 25 MG tablet Take 25 mg by mouth 2 (two) times daily.     cephALEXin (KEFLEX) 500 MG capsule Take 1 capsule (  500 mg total) by mouth every 12 (twelve) hours. 20 capsule 0   doxazosin (CARDURA) 8 MG tablet Take 8 mg by mouth at bedtime.  5   isosorbide mononitrate (ISMO) 10 MG tablet Take 10 mg by mouth daily.     torsemide (DEMADEX) 20 MG tablet Take 60 mg by mouth 2 (two) times daily.     No current facility-administered medications for this visit.    Review of Systems  Constitutional:  Constitutional negative. HENT: HENT negative.  Eyes: Eyes negative.  Respiratory: Respiratory negative.  Cardiovascular: Cardiovascular negative.  GI: Gastrointestinal negative.  Musculoskeletal: Musculoskeletal negative.  Skin: Skin negative.  Neurological: Neurological negative. Hematologic: Hematologic/lymphatic negative.  Psychiatric: Psychiatric negative.        Objective:  Objective  Vitals:   04/14/22 0940  BP: (!) 138/96   Pulse: 93  Resp: 20  Temp: 98 F (36.7 C)  SpO2: 92%      Physical Exam HENT:     Nose: Nose normal.  Eyes:     Pupils: Pupils are equal, round, and reactive to light.  Cardiovascular:     Rate and Rhythm: Normal rate.     Pulses: Normal pulses.  Pulmonary:     Effort: Pulmonary effort is normal.  Abdominal:     General: Abdomen is flat.  Musculoskeletal:        General: Normal range of motion.     Cervical back: Normal range of motion.     Right lower leg: No edema.     Left lower leg: No edema.     Comments: There is a large pseudoaneurysm in the mid segment of the left arm fistula, there is a strong thrill in the fistula  Skin:    General: Skin is warm.     Capillary Refill: Capillary refill takes less than 2 seconds.  Neurological:     General: No focal deficit present.     Mental Status: He is alert.  Psychiatric:        Mood and Affect: Mood normal.        Thought Content: Thought content normal.        Judgment: Judgment normal.     Data: No studies     Assessment/Plan:    47 year old male on dialysis via left arm to stage basilic vein fistula on Tuesdays, Thursdays and Saturdays.  He now has 1 area of pseudoaneurysmal degeneration which is large and quite bothersome to him and he would like to have this repaired.  I discussed first proceeding with fistulogram on Monday and as long as this appears satisfactory we can proceed on a likely Friday and plan for revision of the fistula and placement of catheter to allow the fistula to heal.  Patient demonstrates good understanding we will get this scheduled on a nondialysis day in the near future.     Waynetta Sandy MD [facility]

## 2022-04-14 NOTE — H&P (View-Only) (Signed)
Patient ID: Nathan Campbell, male   DOB: 1975-08-18, 47 y.o.   MRN: [id number]  Reason for Consult: Follow-up   Referred by Janalee Dane, PA*  Subjective:     HPI:  Nathan Campbell is a 47 y.o. male with end-stage renal disease and a history of two-stage basilic vein fistula creation on the left with the last procedure being just over 1 year ago.  Currently dialyzing on Tuesdays, Thursdays and Saturdays.  Does not take any blood thinners.  He has been on dialysis for 1 year using the fistula now has 1 large area of pseudoaneurysmal degeneration which is quite painful for him.  He has not had any bleeding issues.  Past Medical History:  Diagnosis Date   Acute combined systolic and diastolic CHF, NYHA class 4 (HCC) 06/10/2016   Cardiomyopathy ([place]) 06/14/2016   CHF (congestive heart failure) ([place])    COVID 10/2020   Dyspnea    ESRD on hemodialysis (HCC)    TTS at [facility]   Hypertension    Hypertensive heart and kidney disease with heart failure ([place]) 06/14/2016   Hypertensive heart disease with congestive heart failure ([place])    Obesity    Family History  Problem Relation Age of Onset   Hypertension Mother    Diabetes Father    Hypertension Father    Hypertension Sister    Hypertension Paternal Grandmother    Diabetes Mellitus II Paternal Grandfather    Heart disease Cousin    Heart disease Cousin    Colon cancer Cousin    Esophageal cancer Neg Hx    Rectal cancer Neg Hx    Stomach cancer Neg Hx    Past Surgical History:  Procedure Laterality Date   AV FISTULA PLACEMENT Left 11/11/2020   Procedure: LEFT ARM First Stage Basilic Vein Fistula Creation;  Surgeon: Waynetta Sandy, MD;  Location: [place];  Service: Vascular;  Laterality: Left;   [place] Left 01/23/2021   Procedure: Second Stage Basilic Vein Transposition of Left Arm Arteriovenous  Fistula;  Surgeon: Waynetta Sandy, MD;  Location: [place];  Service: Vascular;   Laterality: Left;  Block  to LMA    COLONOSCOPY     NO PAST SURGERIES      Short Social History:  Social History   Tobacco Use   Smoking status: Former    Packs/day: 0.00    Years: 5.00    Total pack years: 0.00    Types: Cigarettes    Quit date: 09/2020    Years since quitting: 1.5   Smokeless tobacco: Never   Tobacco comments:    occasionally  Substance Use Topics   Alcohol use: Not Currently    No Known Allergies  Current Outpatient Medications  Medication Sig Dispense Refill   allopurinol (ZYLOPRIM) 100 MG tablet Take 1 tablet (100 mg total) by mouth daily. Do not begin until pain in right knee is resolved. 30 tablet 1   amLODipine (NORVASC) 10 MG tablet Take 1 tablet (10 mg total) by mouth daily. 30 tablet 1   AURYXIA 1 GM 210 MG(Fe) tablet Take 630 mg by mouth 3 (three) times daily with meals. Take 1 tablet with snacks.     B Complex-C-Zn-Folic Acid (DIALYVITE Q000111Q WITH ZINC) 0.8 MG TABS Take 1 tablet by mouth at bedtime.     carvedilol (COREG) 25 MG tablet Take 25 mg by mouth 2 (two) times daily.     cephALEXin (KEFLEX) 500 MG capsule Take 1 capsule (  500 mg total) by mouth every 12 (twelve) hours. 20 capsule 0   doxazosin (CARDURA) 8 MG tablet Take 8 mg by mouth at bedtime.  5   isosorbide mononitrate (ISMO) 10 MG tablet Take 10 mg by mouth daily.     torsemide (DEMADEX) 20 MG tablet Take 60 mg by mouth 2 (two) times daily.     No current facility-administered medications for this visit.    Review of Systems  Constitutional:  Constitutional negative. HENT: HENT negative.  Eyes: Eyes negative.  Respiratory: Respiratory negative.  Cardiovascular: Cardiovascular negative.  GI: Gastrointestinal negative.  Musculoskeletal: Musculoskeletal negative.  Skin: Skin negative.  Neurological: Neurological negative. Hematologic: Hematologic/lymphatic negative.  Psychiatric: Psychiatric negative.        Objective:  Objective  Vitals:   04/14/22 0940  BP: (!) 138/96   Pulse: 93  Resp: 20  Temp: 98 F (36.7 C)  SpO2: 92%      Physical Exam HENT:     Nose: Nose normal.  Eyes:     Pupils: Pupils are equal, round, and reactive to light.  Cardiovascular:     Rate and Rhythm: Normal rate.     Pulses: Normal pulses.  Pulmonary:     Effort: Pulmonary effort is normal.  Abdominal:     General: Abdomen is flat.  Musculoskeletal:        General: Normal range of motion.     Cervical back: Normal range of motion.     Right lower leg: No edema.     Left lower leg: No edema.     Comments: There is a large pseudoaneurysm in the mid segment of the left arm fistula, there is a strong thrill in the fistula  Skin:    General: Skin is warm.     Capillary Refill: Capillary refill takes less than 2 seconds.  Neurological:     General: No focal deficit present.     Mental Status: He is alert.  Psychiatric:        Mood and Affect: Mood normal.        Thought Content: Thought content normal.        Judgment: Judgment normal.     Data: No studies     Assessment/Plan:    47 year old male on dialysis via left arm to stage basilic vein fistula on Tuesdays, Thursdays and Saturdays.  He now has 1 area of pseudoaneurysmal degeneration which is large and quite bothersome to him and he would like to have this repaired.  I discussed first proceeding with fistulogram on Monday and as long as this appears satisfactory we can proceed on a likely Friday and plan for revision of the fistula and placement of catheter to allow the fistula to heal.  Patient demonstrates good understanding we will get this scheduled on a nondialysis day in the near future.     Waynetta Sandy MD [facility]

## 2022-04-16 ENCOUNTER — Other Ambulatory Visit: Payer: Self-pay

## 2022-04-16 DIAGNOSIS — N186 End stage renal disease: Secondary | ICD-10-CM

## 2022-04-16 MED ORDER — SODIUM CHLORIDE 0.9% FLUSH: 3.0000 mL | Freq: Two times a day (BID) | INTRAVENOUS | Status: DC

## 2022-04-16 MED ORDER — SODIUM CHLORIDE 0.9 % IV SOLN
250.0000 mL | INTRAVENOUS | Status: DC | PRN
  Administered 2023-10-07: 500 mL via INTRAVENOUS

## 2022-05-03 ENCOUNTER — Other Ambulatory Visit: Payer: Self-pay

## 2022-05-03 ENCOUNTER — Ambulatory Visit (HOSPITAL_COMMUNITY)
Admission: RE | Admit: 2022-05-03 | Discharge: 2022-05-03 | Disposition: A | Discharge status: Home / Self Care | Payer: Medicare Other | Attending: Vascular Surgery | Admitting: Vascular Surgery

## 2022-05-03 ENCOUNTER — Encounter (HOSPITAL_COMMUNITY)
Admission: RE | Disposition: A | Discharge status: Home / Self Care | Payer: Self-pay | Source: Home / Self Care | Attending: Vascular Surgery

## 2022-05-03 DIAGNOSIS — Y832 Surgical operation with anastomosis, bypass or graft as the cause of abnormal reaction of the patient, or of later complication, without mention of misadventure at the time of the procedure: Secondary | ICD-10-CM | POA: Diagnosis not present

## 2022-05-03 DIAGNOSIS — Z992 Dependence on renal dialysis: Secondary | ICD-10-CM | POA: Diagnosis not present

## 2022-05-03 DIAGNOSIS — N186 End stage renal disease: Secondary | ICD-10-CM | POA: Diagnosis not present

## 2022-05-03 DIAGNOSIS — N185 Chronic kidney disease, stage 5: Secondary | ICD-10-CM

## 2022-05-03 DIAGNOSIS — I5042 Chronic combined systolic (congestive) and diastolic (congestive) heart failure: Secondary | ICD-10-CM | POA: Diagnosis not present

## 2022-05-03 DIAGNOSIS — Z87891 Personal history of nicotine dependence: Secondary | ICD-10-CM | POA: Insufficient documentation

## 2022-05-03 DIAGNOSIS — T82898A Other specified complication of vascular prosthetic devices, implants and grafts, initial encounter: Secondary | ICD-10-CM

## 2022-05-03 DIAGNOSIS — I132 Hypertensive heart and chronic kidney disease with heart failure and with stage 5 chronic kidney disease, or end stage renal disease: Secondary | ICD-10-CM | POA: Diagnosis not present

## 2022-05-03 DIAGNOSIS — T82510A Breakdown (mechanical) of surgically created arteriovenous fistula, initial encounter: Secondary | ICD-10-CM | POA: Insufficient documentation

## 2022-05-03 HISTORY — PX: A/V FISTULAGRAM: CATH118298

## 2022-05-03 HISTORY — PX: PERIPHERAL VASCULAR BALLOON ANGIOPLASTY: CATH118281

## 2022-05-03 LAB — POCT I-STAT, CHEM 8
BUN: 69 mg/dL — ABNORMAL HIGH (ref 6–20)
Calcium, Ion: 1 mmol/L — ABNORMAL LOW (ref 1.15–1.40)
Chloride: 95 mmol/L — ABNORMAL LOW (ref 98–111)
Creatinine, Ser: 15 mg/dL — ABNORMAL HIGH (ref 0.61–1.24)
Glucose, Bld: 107 mg/dL — ABNORMAL HIGH (ref 70–99)
HCT: 34 % — ABNORMAL LOW (ref 39.0–52.0)
Hemoglobin: 11.6 g/dL — ABNORMAL LOW (ref 13.0–17.0)
Potassium: 4 mmol/L (ref 3.5–5.1)
Sodium: 137 mmol/L (ref 135–145)
TCO2: 28 mmol/L (ref 22–32)

## 2022-05-03 SURGERY — A/V FISTULAGRAM
Anesthesia: LOCAL | Laterality: Left

## 2022-05-03 MED ORDER — LIDOCAINE HCL (PF) 1 % IJ SOLN
INTRAMUSCULAR | Status: DC | PRN
  Administered 2022-05-03: 2 mL

## 2022-05-03 MED ORDER — HEPARIN SODIUM (PORCINE) 1000 UNIT/ML IJ SOLN
INTRAMUSCULAR | Status: AC
  Filled 2022-05-03: qty 10

## 2022-05-03 MED ORDER — HEPARIN (PORCINE) IN NACL 1000-0.9 UT/500ML-% IV SOLN
INTRAVENOUS | Status: DC | PRN
  Administered 2022-05-03: 500 mL

## 2022-05-03 MED ORDER — HEPARIN SODIUM (PORCINE) 1000 UNIT/ML IJ SOLN
INTRAMUSCULAR | Status: DC | PRN
  Administered 2022-05-03: 3000 [IU] via INTRAVENOUS

## 2022-05-03 MED ORDER — LIDOCAINE HCL (PF) 1 % IJ SOLN
INTRAMUSCULAR | Status: AC
  Filled 2022-05-03: qty 30

## 2022-05-03 MED ORDER — SODIUM CHLORIDE 0.9% FLUSH: 3.0000 mL | INTRAVENOUS | Status: DC | PRN

## 2022-05-03 MED ORDER — IODIXANOL 320 MG/ML IV SOLN
INTRAVENOUS | Status: DC | PRN
  Administered 2022-05-03: 65 mL

## 2022-05-03 SURGICAL SUPPLY — 17 items
BAG SNAP BAND KOVER 36X36 (MISCELLANEOUS) ×1 IMPLANT
BALLN MUSTANG 10X80X75 (BALLOONS) ×1
BALLN MUSTANG 8X80X75 (BALLOONS) ×1
BALLOON MUSTANG 10X80X75 (BALLOONS) IMPLANT
BALLOON MUSTANG 8X80X75 (BALLOONS) IMPLANT
CATH ANGIO 5F BER2 65CM (CATHETERS) IMPLANT
COVER DOME SNAP 22 D (MISCELLANEOUS) ×1 IMPLANT
KIT ENCORE 26 ADVANTAGE (KITS) IMPLANT
KIT MICROPUNCTURE NIT STIFF (SHEATH) IMPLANT
PROTECTION STATION PRESSURIZED (MISCELLANEOUS) ×1
SHEATH PINNACLE R/O II 7F 4CM (SHEATH) IMPLANT
SHEATH PROBE COVER 6X72 (BAG) ×1 IMPLANT
STATION PROTECTION PRESSURIZED (MISCELLANEOUS) ×1 IMPLANT
STOPCOCK MORSE 400PSI 3WAY (MISCELLANEOUS) ×1 IMPLANT
TRAY PV CATH (CUSTOM PROCEDURE TRAY) ×1 IMPLANT
TUBING CIL FLEX 10 FLL-RA (TUBING) ×1 IMPLANT
WIRE STARTER BENTSON 035X150 (WIRE) IMPLANT

## 2022-05-03 NOTE — Op Note (Signed)
    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 Scot Dock, MD  ANESTHESIA: Local  EBL: Minimal  INDICATIONS:    [name] is a 47 y.o. male who had a basilic vein transposition done about a year ago.  He developed a large aneurysm.  He will need revision and was set up for a fistulogram.  FINDINGS:   There was a weblike stenosis in the fistula in the mid upper arm and also an area of narrowing in the proximal upper arm.  These were addressed with venoplasty as described below.  The fistula was otherwise widely patent with no central venous stenosis and no stenosis at the arterial end.  TECHNIQUE:   The patient was taken to the PV lab and the left arm was prepped and draped in usual sterile fashion.  After the skin was anesthetized under ultrasound guidance, the fistula was cannulated with a micropuncture needle and a micropuncture sheath was introduced over a wire.  A real-time image was sent to the server.  A fistulogram was then obtained to evaluate the fistula from the point of cannulation to include the central veins.  We also compress the fistula and did a reflux shot to evaluate the arterial anastomosis.  There was a weblike stenosis in the mid upper arm and also an area of narrowing towards the axilla.  I elected to address this with venoplasty.  A Bentson wire was advanced through the micropuncture sheath and this was upsized to a 7 [place] sheath.  I then used a directional catheter to direct the wire into the subclavian vein.  I initially selected an 8 mm x 8 cm balloon which was inflated in both areas to 12 atm for 1 minute.  This seemed to be undersized.  I went back with a 10 mm  x 8 cm balloon.  Both areas were ballooned to 14 atm for 1 minute.  Follow-up films showed significant improvement with an excellent thrill in the fistula.  The cannulation site was closed with a 4-0 Monocryl.  There was good hemostasis.  CLINICAL NOTE: This patient will be scheduled for a plication of his aneurysm on Friday which is a nondialysis day and also possibly placement of a tunneled dialysis catheter.  [name], MD, FACS [facility]  DATE OF DICTATION:   05/03/2022

## 2022-05-03 NOTE — Interval H&P Note (Signed)
History and Physical Interval Note:  05/03/2022 7:52 AM  Nathan Campbell  has presented today for surgery, with the diagnosis of end stage renal disease.  The various methods of treatment have been discussed with the patient and family. After consideration of risks, benefits and other options for treatment, the patient has consented to  Procedure(s): A/V Fistulagram (Left) as a surgical intervention.  The patient's history has been reviewed, patient examined, no change in status, stable for surgery.  I have reviewed the patient's chart and labs.  Questions were answered to the patient's satisfaction.     [name]

## 2022-05-03 NOTE — Interval H&P Note (Signed)
History and Physical Interval Note:  05/03/2022 9:05 AM  Nathan Campbell  has presented today for surgery, with the diagnosis of end stage renal disease.  The various methods of treatment have been discussed with the patient and family. After consideration of risks, benefits and other options for treatment, the patient has consented to  Procedure(s): A/V Fistulagram (Left) as a surgical intervention.  The patient's history has been reviewed, patient examined, no change in status, stable for surgery.  I have reviewed the patient's chart and labs.  Questions were answered to the patient's satisfaction.     [name]

## 2022-05-04 ENCOUNTER — Encounter (HOSPITAL_COMMUNITY): Payer: Self-pay | Admitting: Vascular Surgery

## 2022-05-05 ENCOUNTER — Encounter (HOSPITAL_COMMUNITY): Payer: Self-pay | Admitting: Vascular Surgery

## 2022-05-05 ENCOUNTER — Telehealth: Payer: Self-pay

## 2022-05-05 NOTE — Telephone Encounter (Signed)
Spoke with patient and informed of arrival time change to 0915 AM for surgery on 05/07/22. Patient verbalized understanding.

## 2022-05-06 ENCOUNTER — Other Ambulatory Visit: Payer: Self-pay

## 2022-05-06 ENCOUNTER — Encounter (HOSPITAL_COMMUNITY): Payer: Self-pay | Admitting: Vascular Surgery

## 2022-05-06 NOTE — Progress Notes (Signed)
Spoke with pt for pre-op call. Pt has hx of CHF and HTN. Denies any recent chest pain. Pt is on dialysis on T/Th/Sa.   Shower instructions given to pt and he voiced understanding.

## 2022-05-07 ENCOUNTER — Ambulatory Visit (HOSPITAL_COMMUNITY): Payer: Medicare Other | Admitting: Anesthesiology

## 2022-05-07 ENCOUNTER — Ambulatory Visit (HOSPITAL_COMMUNITY)
Admission: RE | Admit: 2022-05-07 | Discharge: 2022-05-07 | Disposition: A | Discharge status: Home / Self Care | Payer: Medicare Other | Attending: Vascular Surgery | Admitting: Vascular Surgery

## 2022-05-07 ENCOUNTER — Other Ambulatory Visit: Payer: Self-pay

## 2022-05-07 ENCOUNTER — Encounter (HOSPITAL_COMMUNITY): Payer: Self-pay | Admitting: Vascular Surgery

## 2022-05-07 ENCOUNTER — Encounter (HOSPITAL_COMMUNITY)
Admission: RE | Disposition: A | Discharge status: Home / Self Care | Payer: Self-pay | Source: Home / Self Care | Attending: Vascular Surgery

## 2022-05-07 ENCOUNTER — Ambulatory Visit (HOSPITAL_BASED_OUTPATIENT_CLINIC_OR_DEPARTMENT_OTHER): Payer: Medicare Other | Admitting: Anesthesiology

## 2022-05-07 DIAGNOSIS — Z87891 Personal history of nicotine dependence: Secondary | ICD-10-CM | POA: Insufficient documentation

## 2022-05-07 DIAGNOSIS — Y828 Other medical devices associated with adverse incidents: Secondary | ICD-10-CM | POA: Insufficient documentation

## 2022-05-07 DIAGNOSIS — T82898A Other specified complication of vascular prosthetic devices, implants and grafts, initial encounter: Secondary | ICD-10-CM | POA: Diagnosis present

## 2022-05-07 DIAGNOSIS — I5042 Chronic combined systolic (congestive) and diastolic (congestive) heart failure: Secondary | ICD-10-CM | POA: Insufficient documentation

## 2022-05-07 DIAGNOSIS — N186 End stage renal disease: Secondary | ICD-10-CM | POA: Diagnosis not present

## 2022-05-07 DIAGNOSIS — Z992 Dependence on renal dialysis: Secondary | ICD-10-CM

## 2022-05-07 DIAGNOSIS — Z79899 Other long term (current) drug therapy: Secondary | ICD-10-CM | POA: Diagnosis not present

## 2022-05-07 DIAGNOSIS — M1990 Unspecified osteoarthritis, unspecified site: Secondary | ICD-10-CM | POA: Insufficient documentation

## 2022-05-07 DIAGNOSIS — I509 Heart failure, unspecified: Secondary | ICD-10-CM

## 2022-05-07 DIAGNOSIS — I132 Hypertensive heart and chronic kidney disease with heart failure and with stage 5 chronic kidney disease, or end stage renal disease: Secondary | ICD-10-CM | POA: Diagnosis not present

## 2022-05-07 DIAGNOSIS — N185 Chronic kidney disease, stage 5: Secondary | ICD-10-CM | POA: Diagnosis not present

## 2022-05-07 DIAGNOSIS — Z6841 Body Mass Index (BMI) 40.0 and over, adult: Secondary | ICD-10-CM | POA: Insufficient documentation

## 2022-05-07 HISTORY — PX: FISTULA SUPERFICIALIZATION: SHX6341

## 2022-05-07 HISTORY — DX: Anemia, unspecified: D64.9

## 2022-05-07 LAB — POCT I-STAT, CHEM 8
BUN: 42 mg/dL — ABNORMAL HIGH (ref 6–20)
Calcium, Ion: 1.07 mmol/L — ABNORMAL LOW (ref 1.15–1.40)
Chloride: 100 mmol/L (ref 98–111)
Creatinine, Ser: 10.1 mg/dL — ABNORMAL HIGH (ref 0.61–1.24)
Glucose, Bld: 113 mg/dL — ABNORMAL HIGH (ref 70–99)
HCT: 33 % — ABNORMAL LOW (ref 39.0–52.0)
Hemoglobin: 11.2 g/dL — ABNORMAL LOW (ref 13.0–17.0)
Potassium: 3.9 mmol/L (ref 3.5–5.1)
Sodium: 140 mmol/L (ref 135–145)
TCO2: 30 mmol/L (ref 22–32)

## 2022-05-07 SURGERY — FISTULA SUPERFICIALIZATION
Anesthesia: General | Laterality: Left

## 2022-05-07 MED ORDER — HEPARIN 6000 UNIT IRRIGATION SOLUTION
Status: DC | PRN
  Administered 2022-05-07: 1

## 2022-05-07 MED ORDER — DEXAMETHASONE SODIUM PHOSPHATE 10 MG/ML IJ SOLN
INTRAMUSCULAR | Status: AC
  Filled 2022-05-07: qty 1

## 2022-05-07 MED ORDER — FENTANYL CITRATE (PF) 100 MCG/2ML IJ SOLN: 25.0000 ug | INTRAMUSCULAR | Status: DC | PRN

## 2022-05-07 MED ORDER — OXYCODONE HCL 5 MG PO TABS: 5.0000 mg | ORAL_TABLET | Freq: Once | ORAL | Status: DC | PRN

## 2022-05-07 MED ORDER — LIDOCAINE-EPINEPHRINE (PF) 1 %-1:200000 IJ SOLN
INTRAMUSCULAR | Status: AC
  Filled 2022-05-07: qty 60

## 2022-05-07 MED ORDER — LIDOCAINE-EPINEPHRINE (PF) 1 %-1:200000 IJ SOLN
INTRAMUSCULAR | Status: DC | PRN
  Administered 2022-05-07: 16 mL via INTRADERMAL

## 2022-05-07 MED ORDER — FENTANYL CITRATE (PF) 250 MCG/5ML IJ SOLN
INTRAMUSCULAR | Status: AC
  Filled 2022-05-07: qty 5

## 2022-05-07 MED ORDER — CHLORHEXIDINE GLUCONATE 0.12 % MT SOLN
15.0000 mL | Freq: Once | OROMUCOSAL | Status: AC
  Administered 2022-05-07: 15 mL via OROMUCOSAL
  Filled 2022-05-07: qty 15

## 2022-05-07 MED ORDER — CEFAZOLIN IN SODIUM CHLORIDE 3-0.9 GM/100ML-% IV SOLN
3.0000 g | INTRAVENOUS | Status: AC
  Administered 2022-05-07: 3 g via INTRAVENOUS
  Filled 2022-05-07: qty 100

## 2022-05-07 MED ORDER — SODIUM CHLORIDE 0.9 % IV SOLN: INTRAVENOUS | Status: DC

## 2022-05-07 MED ORDER — DEXAMETHASONE SODIUM PHOSPHATE 10 MG/ML IJ SOLN
INTRAMUSCULAR | Status: DC | PRN
  Administered 2022-05-07: 5 mg via INTRAVENOUS

## 2022-05-07 MED ORDER — 0.9 % SODIUM CHLORIDE (POUR BTL) OPTIME
TOPICAL | Status: DC | PRN
  Administered 2022-05-07: 1000 mL

## 2022-05-07 MED ORDER — HEPARIN SODIUM (PORCINE) 1000 UNIT/ML IJ SOLN
INTRAMUSCULAR | Status: AC
  Filled 2022-05-07: qty 10

## 2022-05-07 MED ORDER — ONDANSETRON HCL 4 MG/2ML IJ SOLN
INTRAMUSCULAR | Status: DC | PRN
  Administered 2022-05-07: 4 mg via INTRAVENOUS

## 2022-05-07 MED ORDER — PROTAMINE SULFATE 10 MG/ML IV SOLN
INTRAVENOUS | Status: DC | PRN
  Administered 2022-05-07: 50 mg via INTRAVENOUS

## 2022-05-07 MED ORDER — OXYCODONE-ACETAMINOPHEN 5-325 MG PO TABS: 1.0000 | ORAL_TABLET | ORAL | 0 refills | Status: AC | PRN

## 2022-05-07 MED ORDER — CHLORHEXIDINE GLUCONATE 4 % EX LIQD: 60.0000 mL | Freq: Once | CUTANEOUS | Status: DC

## 2022-05-07 MED ORDER — OXYCODONE HCL 5 MG/5ML PO SOLN: 5.0000 mg | Freq: Once | ORAL | Status: DC | PRN

## 2022-05-07 MED ORDER — PROPOFOL 10 MG/ML IV BOLUS
INTRAVENOUS | Status: AC
  Filled 2022-05-07: qty 20

## 2022-05-07 MED ORDER — LIDOCAINE 2% (20 MG/ML) 5 ML SYRINGE
INTRAMUSCULAR | Status: DC | PRN
  Administered 2022-05-07: 60 mg via INTRAVENOUS

## 2022-05-07 MED ORDER — MIDAZOLAM HCL 2 MG/2ML IJ SOLN
INTRAMUSCULAR | Status: AC
  Filled 2022-05-07: qty 2

## 2022-05-07 MED ORDER — PROPOFOL 10 MG/ML IV BOLUS
INTRAVENOUS | Status: DC | PRN
  Administered 2022-05-07: 100 mg via INTRAVENOUS
  Administered 2022-05-07: 200 mg via INTRAVENOUS

## 2022-05-07 MED ORDER — PHENYLEPHRINE HCL-NACL 20-0.9 MG/250ML-% IV SOLN
INTRAVENOUS | Status: DC | PRN
  Administered 2022-05-07: 20 ug/min via INTRAVENOUS

## 2022-05-07 MED ORDER — ONDANSETRON HCL 4 MG/2ML IJ SOLN
INTRAMUSCULAR | Status: AC
  Filled 2022-05-07: qty 2

## 2022-05-07 MED ORDER — ONDANSETRON HCL 4 MG/2ML IJ SOLN: 4.0000 mg | Freq: Four times a day (QID) | INTRAMUSCULAR | Status: DC | PRN

## 2022-05-07 MED ORDER — ORAL CARE MOUTH RINSE: 15.0000 mL | Freq: Once | OROMUCOSAL | Status: AC

## 2022-05-07 MED ORDER — PROTAMINE SULFATE 10 MG/ML IV SOLN
INTRAVENOUS | Status: AC
  Filled 2022-05-07: qty 5

## 2022-05-07 MED ORDER — HEPARIN SODIUM (PORCINE) 1000 UNIT/ML IJ SOLN
INTRAMUSCULAR | Status: DC | PRN
  Administered 2022-05-07: 10000 [IU] via INTRAVENOUS

## 2022-05-07 MED ORDER — FENTANYL CITRATE (PF) 250 MCG/5ML IJ SOLN
INTRAMUSCULAR | Status: DC | PRN
  Administered 2022-05-07 (×4): 50 ug via INTRAVENOUS

## 2022-05-07 MED ORDER — MIDAZOLAM HCL 5 MG/5ML IJ SOLN
INTRAMUSCULAR | Status: DC | PRN
  Administered 2022-05-07: 2 mg via INTRAVENOUS

## 2022-05-07 MED ORDER — HEPARIN 6000 UNIT IRRIGATION SOLUTION
Status: AC
  Filled 2022-05-07: qty 500

## 2022-05-07 MED ORDER — PHENYLEPHRINE 80 MCG/ML (10ML) SYRINGE FOR IV PUSH (FOR BLOOD PRESSURE SUPPORT)
PREFILLED_SYRINGE | INTRAVENOUS | Status: DC | PRN
  Administered 2022-05-07 (×3): 80 ug via INTRAVENOUS

## 2022-05-07 SURGICAL SUPPLY — 64 items
ARMBAND PINK RESTRICT EXTREMIT (MISCELLANEOUS) ×2 IMPLANT
BAG COUNTER SPONGE SURGICOUNT (BAG) ×2 IMPLANT
BAG DECANTER FOR FLEXI CONT (MISCELLANEOUS) ×1 IMPLANT
BIOPATCH RED 1 DISK 7.0 (GAUZE/BANDAGES/DRESSINGS) ×1 IMPLANT
CANISTER SUCT 3000ML PPV (MISCELLANEOUS) ×2 IMPLANT
CANNULA VESSEL 3MM 2 BLNT TIP (CANNULA) ×2 IMPLANT
CATH PALINDROME-P 19CM W/VT (CATHETERS) IMPLANT
CATH PALINDROME-P 23CM W/VT (CATHETERS) IMPLANT
CATH PALINDROME-P 28CM W/VT (CATHETERS) IMPLANT
CHLORAPREP W/TINT 26 (MISCELLANEOUS) ×2 IMPLANT
CLIP VESOCCLUDE MED 6/CT (CLIP) ×2 IMPLANT
CLIP VESOCCLUDE SM WIDE 6/CT (CLIP) ×2 IMPLANT
COVER PROBE CYLINDRICAL 5X96 (MISCELLANEOUS) ×1 IMPLANT
COVER PROBE W GEL 5X96 (DRAPES) ×2 IMPLANT
COVER SURGICAL LIGHT HANDLE (MISCELLANEOUS) ×2 IMPLANT
DERMABOND ADVANCED .7 DNX12 (GAUZE/BANDAGES/DRESSINGS) ×2 IMPLANT
DRAPE C-ARM 42X72 X-RAY (DRAPES) ×2 IMPLANT
DRAPE CHEST BREAST 15X10 FENES (DRAPES) ×2 IMPLANT
ELECT REM PT RETURN 9FT ADLT (ELECTROSURGICAL) ×2
ELECTRODE REM PT RTRN 9FT ADLT (ELECTROSURGICAL) ×2 IMPLANT
GAUZE 4X4 16PLY ~~LOC~~+RFID DBL (SPONGE) ×2 IMPLANT
GLOVE BIO SURGEON STRL SZ7.5 (GLOVE) ×2 IMPLANT
GLOVE BIOGEL PI IND STRL 7.0 (GLOVE) ×2 IMPLANT
GLOVE BIOGEL PI IND STRL 7.5 (GLOVE) ×1 IMPLANT
GLOVE BIOGEL PI IND STRL 8 (GLOVE) ×2 IMPLANT
GLOVE SURG POLY ORTHO LF SZ7.5 (GLOVE) IMPLANT
GLOVE SURG SS PI 6.5 STRL IVOR (GLOVE) ×1 IMPLANT
GLOVE SURG SS PI 7.0 STRL IVOR (GLOVE) ×1 IMPLANT
GLOVE SURG UNDER LTX SZ8 (GLOVE) ×2 IMPLANT
GOWN STRL REUS W/ TWL LRG LVL3 (GOWN DISPOSABLE) ×6 IMPLANT
GOWN STRL REUS W/ TWL XL LVL3 (GOWN DISPOSABLE) ×1 IMPLANT
GOWN STRL REUS W/TWL LRG LVL3 (GOWN DISPOSABLE) ×6
GOWN STRL REUS W/TWL XL LVL3 (GOWN DISPOSABLE) ×2
KIT BASIN OR (CUSTOM PROCEDURE TRAY) ×2 IMPLANT
KIT PALINDROME-P 55CM (CATHETERS) IMPLANT
KIT TURNOVER KIT B (KITS) ×2 IMPLANT
NDL 18GX1X1/2 (RX/OR ONLY) (NEEDLE) ×1 IMPLANT
NDL HYPO 25GX1X1/2 BEV (NEEDLE) ×1 IMPLANT
NEEDLE 18GX1X1/2 (RX/OR ONLY) (NEEDLE) IMPLANT
NEEDLE HYPO 25GX1X1/2 BEV (NEEDLE) ×2 IMPLANT
NS IRRIG 1000ML POUR BTL (IV SOLUTION) ×2 IMPLANT
PACK BASIC III (CUSTOM PROCEDURE TRAY)
PACK CV ACCESS (CUSTOM PROCEDURE TRAY) ×2 IMPLANT
PACK SRG BSC III STRL LF ECLPS (CUSTOM PROCEDURE TRAY) ×1 IMPLANT
PAD ARMBOARD 7.5X6 YLW CONV (MISCELLANEOUS) ×4 IMPLANT
SLING ARM FOAM STRAP LRG (SOFTGOODS) IMPLANT
SLING ARM FOAM STRAP MED (SOFTGOODS) IMPLANT
SPIKE FLUID TRANSFER (MISCELLANEOUS) ×1 IMPLANT
SPONGE SURGIFOAM ABS GEL 100 (HEMOSTASIS) IMPLANT
SUT ETHILON 3 0 PS 1 (SUTURE) ×1 IMPLANT
SUT MNCRL AB 4-0 PS2 18 (SUTURE) ×2 IMPLANT
SUT PROLENE 5 0 C 1 24 (SUTURE) ×1 IMPLANT
SUT PROLENE 5 0 C 1 36 (SUTURE) ×1 IMPLANT
SUT PROLENE 6 0 BV (SUTURE) ×2 IMPLANT
SUT VIC AB 3-0 SH 27 (SUTURE) ×2
SUT VIC AB 3-0 SH 27X BRD (SUTURE) ×2 IMPLANT
SYR 10ML LL (SYRINGE) ×1 IMPLANT
SYR 20ML LL LF (SYRINGE) ×2 IMPLANT
SYR 5ML LL (SYRINGE) ×2 IMPLANT
SYR CONTROL 10ML LL (SYRINGE) ×2 IMPLANT
TOWEL GREEN STERILE (TOWEL DISPOSABLE) ×3 IMPLANT
TOWEL GREEN STERILE FF (TOWEL DISPOSABLE) ×1 IMPLANT
UNDERPAD 30X36 HEAVY ABSORB (UNDERPADS AND DIAPERS) ×2 IMPLANT
WATER STERILE IRR 1000ML POUR (IV SOLUTION) ×2 IMPLANT

## 2022-05-07 NOTE — Op Note (Signed)
    NAME: Nathan Campbell    MRN: [id number] DOB: 04/18/1975    DATE OF OPERATION: 05/07/2022  PREOP DIAGNOSIS:    Aneurysm left AV fistula  POSTOP DIAGNOSIS:    Same  PROCEDURE:    Plication of aneurysm of left AV fistula  SURGEON: Judeth Cornfield. Scot Dock, MD  ASSIST: Nurse  ANESTHESIA: General  EBL: Minimal  INDICATIONS:    [name] is a 47 y.o. male who presented with a large aneurysm in his left AV fistula.  He presents for plication.  He did undergo a preoperative fistulogram which did not identify any other significant problems with the fistula.  FINDINGS:   Excellent thrill at the completion of the procedure.  He should have adequate room to cannulate the fistula away from the incision.  TECHNIQUE:   The patient was taken to the operating room and received a general anesthetic.  The left arm was prepped and draped in the usual sterile fashion.  A fishmouth incision was made over the aneurysm and the aneurysm was dissected free circumferentially.  It was controlled proximally and distally.  The patient was heparinized.  The fistula was clamped and opened and the large aneurysmal segment was excised from the medial aspect of the fistula.  This was then closed with a running 5-0 Prolene suture.  The suture line was along the medial aspect of the fistula and should be away from the point of cannulation.  Hemostasis was obtained in the wound.  The wound was then closed the deep layer 3-0 Vicryl and the skin closed with 4-0 Monocryl.  Dermabond was applied.  The patient tolerated the procedure well was transferred to the recovery in stable condition.  All needle and sponge counts were correct.  [name], MD, FACS [facility]  DATE OF DICTATION:   05/07/2022

## 2022-05-07 NOTE — Transfer of Care (Signed)
Immediate Anesthesia Transfer of Care Note  Patient: Nathan Campbell  Procedure(s) Performed: PLICATION OF ANEURYSM, LEFT ARM FISTULA (Left)  Patient Location: PACU  Anesthesia Type:General  Level of Consciousness: drowsy and patient cooperative  Airway & Oxygen Therapy: Patient Spontanous Breathing and Patient connected to face mask oxygen  Post-op Assessment: Report given to RN and Post -op Vital signs reviewed and stable  Post vital signs: Reviewed and stable  Last Vitals:  Vitals Value Taken Time  BP 155/100 05/07/22 0916  Temp    Pulse 77 05/07/22 0918  Resp 21 05/07/22 0918  SpO2 96 % 05/07/22 0918  Vitals shown include unvalidated device data.  Last Pain:  Vitals:   05/07/22 0646  TempSrc:   PainSc: 0-No pain         Complications: No notable events documented.

## 2022-05-07 NOTE — Anesthesia Preprocedure Evaluation (Signed)
Anesthesia Evaluation  Patient identified by MRN, date of birth, ID band Patient awake    Reviewed: Allergy & Precautions, H&P , NPO status , Patient's Chart, lab work & pertinent test results  Airway Mallampati: II   Neck ROM: full    Dental   Pulmonary shortness of breath, former smoker   breath sounds clear to auscultation       Cardiovascular hypertension, +CHF   Rhythm:regular Rate:Normal  TTE (03/2019): EF 60-65%   Neuro/Psych    GI/Hepatic   Endo/Other    Morbid obesity  Renal/GU ESRF and DialysisRenal disease     Musculoskeletal  (+) Arthritis ,    Abdominal   Peds  Hematology   Anesthesia Other Findings   Reproductive/Obstetrics                             Anesthesia Physical Anesthesia Plan  ASA: 4  Anesthesia Plan: General   Post-op Pain Management:    Induction: Intravenous  PONV Risk Score and Plan: 2 and Ondansetron, Dexamethasone, Midazolam and Treatment may vary due to age or medical condition  Airway Management Planned: LMA  Additional Equipment:   Intra-op Plan:   Post-operative Plan: Extubation in OR  Informed Consent: I have reviewed the patients History and Physical, chart, labs and discussed the procedure including the risks, benefits and alternatives for the proposed anesthesia with the patient or authorized representative who has indicated his/her understanding and acceptance.     Dental advisory given  Plan Discussed with: CRNA, Anesthesiologist and Surgeon  Anesthesia Plan Comments:        Anesthesia Quick Evaluation

## 2022-05-07 NOTE — Interval H&P Note (Signed)
History and Physical Interval Note:  05/07/2022 7:17 AM  Nathan Campbell  has presented today for surgery, with the diagnosis of ESRD.  The various methods of treatment have been discussed with the patient and family. After consideration of risks, benefits and other options for treatment, the patient has consented to  Procedure(s): PLICATION OF ANEURYSM, LEFT ARM FISTULA (Left) POSSIBLE INSERTION OF A TUNNELED DIALYSIS CATHETER (N/A) as a surgical intervention.  The patient's history has been reviewed, patient examined, no change in status, stable for surgery.  I have reviewed the patient's chart and labs.  Questions were answered to the patient's satisfaction.     [name]

## 2022-05-07 NOTE — Anesthesia Procedure Notes (Signed)
Procedure Name: LMA Insertion Date/Time: 05/07/2022 7:40 AM  Performed by: Colin Benton, CRNAPre-anesthesia Checklist: Patient identified, Emergency Drugs available, Suction available and Patient being monitored Patient Re-evaluated:Patient Re-evaluated prior to induction Oxygen Delivery Method: Circle System Utilized Preoxygenation: Pre-oxygenation with 100% oxygen Induction Type: IV induction Ventilation: Mask ventilation without difficulty LMA: LMA inserted LMA Size: 5.0 Number of attempts: 1 Placement Confirmation: positive ETCO2 Tube secured with: Tape Dental Injury: Teeth and Oropharynx as per pre-operative assessment

## 2022-05-07 NOTE — Anesthesia Postprocedure Evaluation (Signed)
Anesthesia Post Note  Patient: Nathan Campbell  Procedure(s) Performed: PLICATION OF ANEURYSM, LEFT ARM FISTULA (Left)     Patient location during evaluation: PACU Anesthesia Type: General Level of consciousness: awake and alert and oriented Pain management: pain level controlled Vital Signs Assessment: post-procedure vital signs reviewed and stable Respiratory status: spontaneous breathing, nonlabored ventilation and respiratory function stable Cardiovascular status: blood pressure returned to baseline and stable Postop Assessment: no apparent nausea or vomiting Anesthetic complications: no   No notable events documented.  Last Vitals:  Vitals:   05/07/22 0930 05/07/22 0945  BP: (!) 146/93 (!) 141/92  Pulse: 80 71  Resp: 18 16  Temp:    SpO2: 93% 95%    Last Pain:  Vitals:   05/07/22 0945  TempSrc:   PainSc: 0-No pain                 [name]

## 2022-05-10 ENCOUNTER — Encounter (HOSPITAL_COMMUNITY): Payer: Self-pay | Admitting: Vascular Surgery

## 2022-05-27 ENCOUNTER — Telehealth: Payer: Self-pay | Admitting: *Deleted

## 2022-05-27 NOTE — Telephone Encounter (Signed)
error 

## 2022-06-28 ENCOUNTER — Encounter: Payer: Self-pay | Admitting: *Deleted

## 2022-08-22 ENCOUNTER — Ambulatory Visit (INDEPENDENT_AMBULATORY_CARE_PROVIDER_SITE_OTHER): Payer: Medicare Other

## 2022-08-22 ENCOUNTER — Ambulatory Visit
Admission: EM | Admit: 2022-08-22 | Discharge: 2022-08-22 | Disposition: A | Discharge status: Home / Self Care | Payer: Medicare Other | Attending: Urgent Care | Admitting: Urgent Care

## 2022-08-22 DIAGNOSIS — Z992 Dependence on renal dialysis: Secondary | ICD-10-CM | POA: Diagnosis not present

## 2022-08-22 DIAGNOSIS — B349 Viral infection, unspecified: Secondary | ICD-10-CM | POA: Insufficient documentation

## 2022-08-22 DIAGNOSIS — I739 Peripheral vascular disease, unspecified: Secondary | ICD-10-CM | POA: Insufficient documentation

## 2022-08-22 DIAGNOSIS — R0989 Other specified symptoms and signs involving the circulatory and respiratory systems: Secondary | ICD-10-CM

## 2022-08-22 DIAGNOSIS — R058 Other specified cough: Secondary | ICD-10-CM | POA: Insufficient documentation

## 2022-08-22 DIAGNOSIS — N186 End stage renal disease: Secondary | ICD-10-CM | POA: Insufficient documentation

## 2022-08-22 DIAGNOSIS — Z1152 Encounter for screening for COVID-19: Secondary | ICD-10-CM | POA: Insufficient documentation

## 2022-08-22 MED ORDER — PROMETHAZINE-DM 6.25-15 MG/5ML PO SYRP: 5.0000 mL | ORAL_SOLUTION | Freq: Three times a day (TID) | ORAL | 0 refills | Status: DC | PRN

## 2022-08-22 MED ORDER — BENZONATATE 100 MG PO CAPS: 100.0000 mg | ORAL_CAPSULE | Freq: Three times a day (TID) | ORAL | 0 refills | Status: DC | PRN

## 2022-08-22 MED ORDER — ACETAMINOPHEN 325 MG PO TABS: 650.0000 mg | ORAL_TABLET | Freq: Four times a day (QID) | ORAL | 0 refills | Status: DC | PRN

## 2022-08-22 NOTE — Discharge Instructions (Addendum)
We will notify you of your test results as they arrive and may take between about 24 hours.  I encourage you to sign up for MyChart if you have not already done so as this can be the easiest way for [place] to communicate results to you online or through a phone app.  Generally, we only contact you if it is a positive test result.  In the meantime, if you develop worsening symptoms including fever, chest pain, shortness of breath despite our current treatment plan then please report to the emergency room as this may be a sign of worsening status from possible viral infection.  Otherwise, we will manage this as a viral syndrome. For sore throat or cough try using a honey-based tea. Use 3 teaspoons of honey with juice squeezed from half lemon. Place shaved pieces of ginger into 1/2-1 cup of water and warm over stove top. Then mix the ingredients and repeat every 4 hours as needed. Please take Tylenol 650mg  every 6 hours for aches and pains, fevers. Hydrate very well with at least 2 liters of water. Eat light meals such as soups to replenish electrolytes and soft fruits, veggies.  Use the cough medications as needed.

## 2022-08-22 NOTE — ED Triage Notes (Signed)
Pt reports cough, chest congestion, chest discomfort when coughing x 2 days. Denies chest pain, discomfort at this moment.   Pt reports pain in right leg and burning sensation when walking x 2 months.

## 2022-08-22 NOTE — ED Provider Notes (Signed)
[name]  Note:  This document was prepared using Conservation officer, historic buildings and may include unintentional dictation errors.  MRN: [id number] DOB: July 02, 1975  Subjective:   Nathan Campbell is a 47 y.o. male presenting for 2-day history of coughing, chest congestion, chest discomfort from coughing, malaise, fatigue. He is currently on dialysis, has ESRD.  He does have a history of acute combined systolic and diastolic congestive heart failure, cardiomyopathy, hypertensive heart and kidney disease.  No history of asthma or COPD.  He has also had 5-month history of persistent intermittent right lower leg pain, burning sensation when he walks.  Symptoms resolve when he rests.  He does not experience any particular symptoms at rest.  No redness, warmth, erythema of the calf or swelling.  No history of DVT.  Smoked 1/2ppd for 30 years, has cut back significantly for the past 3 years.    Current Facility-Administered Medications:    0.9 %  sodium chloride infusion, 250 mL, Intravenous, PRN, Maeola Harman, MD   sodium chloride flush (NS) 0.9 % injection 3 mL, 3 mL, Intravenous, Q12H, Maeola Harman, MD  Current Outpatient Medications:    allopurinol (ZYLOPRIM) 100 MG tablet, Take 1 tablet (100 mg total) by mouth daily. Do not begin until pain in right knee is resolved. (Patient not taking: Reported on 04/29/2022), Disp: 30 tablet, Rfl: 1   amLODipine (NORVASC) 10 MG tablet, Take 1 tablet (10 mg total) by mouth daily., Disp: 30 tablet, Rfl: 1   AURYXIA 1 GM 210 MG(Fe) tablet, Take 420-630 mg by mouth See admin instructions. Take 630 mg with each meal and 420 to 630 mg with each snack, Disp: , Rfl:    B Complex-C-Zn-Folic Acid (DIALYVITE 800/ZINC PO), Take 1 tablet by mouth daily., Disp: , Rfl:    carvedilol (COREG) 25 MG tablet, Take 25 mg by mouth 2 (two) times daily., Disp: , Rfl:    cephALEXin (KEFLEX) 500 MG capsule, Take 1 capsule (500 mg  total) by mouth every 12 (twelve) hours. (Patient not taking: Reported on 04/29/2022), Disp: 20 capsule, Rfl: 0   isosorbide mononitrate (ISMO) 10 MG tablet, Take 10 mg by mouth daily., Disp: , Rfl:    oxyCODONE-acetaminophen (PERCOCET) 5-325 MG tablet, Take 1 tablet by mouth every 4 (four) hours as needed for severe pain., Disp: 15 tablet, Rfl: 0   torsemide (DEMADEX) 20 MG tablet, Take 60 mg by mouth 2 (two) times daily., Disp: , Rfl:    No Known Allergies  Past Medical History:  Diagnosis Date   Acute combined systolic and diastolic CHF, NYHA class 4 (HCC) 06/10/2016   Anemia    Cardiomyopathy (HCC) 06/14/2016   CHF (congestive heart failure) (HCC)    COVID 10/2020   Dyspnea    ESRD on hemodialysis (HCC)    TTS at [facility]   Hypertension    Hypertensive heart and kidney disease with heart failure (HCC) 06/14/2016   Hypertensive heart disease with congestive heart failure (HCC)    Obesity      Past Surgical History:  Procedure Laterality Date   A/V FISTULAGRAM Left 05/03/2022   Procedure: A/V Fistulagram;  Surgeon: Chuck Hint, MD;  Location: [facility] INVASIVE CV LAB;  Service: Cardiovascular;  Laterality: Left;   AV FISTULA PLACEMENT Left 11/11/2020   Procedure: LEFT ARM First Stage Basilic Vein Fistula Creation;  Surgeon: Maeola Harman, MD;  Location: [facility] OR;  Service: Vascular;  Laterality: Left;   BASCILIC VEIN TRANSPOSITION Left  01/23/2021   Procedure: Second Stage Basilic Vein Transposition of Left Arm Arteriovenous  Fistula;  Surgeon: Maeola Harman, MD;  Location: [facility] OR;  Service: Vascular;  Laterality: Left;  Block  to LMA    COLONOSCOPY     FISTULA SUPERFICIALIZATION Left 05/07/2022   Procedure: PLICATION OF ANEURYSM, LEFT ARM FISTULA;  Surgeon: Chuck Hint, MD;  Location: [facility] OR;  Service: Vascular;  Laterality: Left;   NO PAST SURGERIES     PERIPHERAL VASCULAR BALLOON ANGIOPLASTY Left 05/03/2022   Procedure: PERIPHERAL VASCULAR  BALLOON ANGIOPLASTY;  Surgeon: Chuck Hint, MD;  Location: [facility] INVASIVE CV LAB;  Service: Cardiovascular;  Laterality: Left;  AVF    Family History  Problem Relation Age of Onset   Hypertension Mother    Diabetes Father    Hypertension Father    Hypertension Sister    Hypertension Paternal Grandmother    Diabetes Mellitus II Paternal Grandfather    Heart disease Cousin    Heart disease Cousin    Colon cancer Cousin    Esophageal cancer Neg Hx    Rectal cancer Neg Hx    Stomach cancer Neg Hx     Social History   Tobacco Use   Smoking status: Former    Packs/day: 0.00    Years: 5.00    Additional pack years: 0.00    Total pack years: 0.00    Types: Cigarettes    Quit date: 09/2020    Years since quitting: 1.9   Smokeless tobacco: Never   Tobacco comments:    occasionally  Vaping Use   Vaping Use: Some days  Substance Use Topics   Alcohol use: Not Currently   Drug use: Not Currently    ROS   Objective:   Vitals: BP (!) 162/94 (BP Location: Right Arm)   Pulse 85   Temp 98.4 F (36.9 C) (Oral)   Resp 20   SpO2 92%   Physical Exam Constitutional:      General: He is not in acute distress.    Appearance: Normal appearance. He is well-developed and normal weight. He is not ill-appearing, toxic-appearing or diaphoretic.  HENT:     Head: Normocephalic and atraumatic.     Right Ear: Tympanic membrane, ear canal and external ear normal. No drainage, swelling or tenderness. No middle ear effusion. There is no impacted cerumen. Tympanic membrane is not erythematous or bulging.     Left Ear: Tympanic membrane, ear canal and external ear normal. No drainage, swelling or tenderness.  No middle ear effusion. There is no impacted cerumen. Tympanic membrane is not erythematous or bulging.     Nose: Nose normal. No congestion or rhinorrhea.     Mouth/Throat:     Mouth: Mucous membranes are moist.     Pharynx: No oropharyngeal exudate or posterior oropharyngeal  erythema.  Eyes:     General: No scleral icterus.       Right eye: No discharge.        Left eye: No discharge.     Extraocular Movements: Extraocular movements intact.     Conjunctiva/sclera: Conjunctivae normal.  Cardiovascular:     Rate and Rhythm: Normal rate and regular rhythm.     Heart sounds: Normal heart sounds. No murmur heard.    No friction rub. No gallop.  Pulmonary:     Effort: Pulmonary effort is normal. No respiratory distress.     Breath sounds: Normal breath sounds. No stridor. No wheezing, rhonchi or rales.  Musculoskeletal:  General: No swelling, tenderness or deformity.     Cervical back: Normal range of motion and neck supple. No rigidity. No muscular tenderness.     Right lower leg: No edema.     Left lower leg: No edema.  Neurological:     General: No focal deficit present.     Mental Status: He is alert and oriented to person, place, and time.  Psychiatric:        Mood and Affect: Mood normal.        Behavior: Behavior normal.        Thought Content: Thought content normal.    DG Chest 2 View  Result Date: 08/22/2022 CLINICAL DATA:  Chest congestion and cough. EXAM: CHEST - 2 VIEW COMPARISON:  03/18/2018 FINDINGS: Low volume film. The lungs are clear without focal pneumonia, edema, pneumothorax or pleural effusion. Cardiopericardial silhouette is at upper limits of normal for size. No acute bony abnormality. IMPRESSION: Low volume film without acute cardiopulmonary findings. Electronically Signed   By: Kennith Center M.D.   On: 08/22/2022 10:43    Assessment and Plan :   PDMP not reviewed this encounter.  1. Productive cough   2. Claudication (HCC)   3. Chest congestion   4. Acute viral syndrome   5. ESRD on dialysis [facility])    Chest x-ray negative for pneumonia.  Recommended supportive care safe with his ESRD.  COVID testing pending, patient would benefit from using molnupiravir should he test positive.  For his lower leg discomfort, patient has  symptoms consistent with claudication.  Low suspicion for DVT.  Physical exam findings not consistent with this.  He has a negative Homans' sign.  Recommended consultation and further workup with the vascular clinic.  In the meantime, continue efforts at quitting smoking and follow-up with PCP.  Counseled patient on potential for adverse effects with medications prescribed/recommended today, ER and return-to-clinic precautions discussed, patient verbalized understanding.    Wallis Bamberg, PA-C 08/22/22 1259

## 2022-08-23 LAB — SARS CORONAVIRUS 2 (TAT 6-24 HRS): SARS Coronavirus 2: NEGATIVE

## 2022-09-17 ENCOUNTER — Other Ambulatory Visit: Payer: Self-pay | Admitting: *Deleted

## 2022-09-17 DIAGNOSIS — M79606 Pain in leg, unspecified: Secondary | ICD-10-CM

## 2022-09-29 ENCOUNTER — Ambulatory Visit: Payer: Medicare Other | Admitting: Vascular Surgery

## 2022-09-29 ENCOUNTER — Encounter: Payer: Self-pay | Admitting: Vascular Surgery

## 2022-09-29 ENCOUNTER — Ambulatory Visit (HOSPITAL_COMMUNITY)
Admission: RE | Admit: 2022-09-29 | Discharge: 2022-09-29 | Disposition: A | Discharge status: Home / Self Care | Payer: Medicare Other | Source: Ambulatory Visit | Attending: Vascular Surgery | Admitting: Vascular Surgery

## 2022-09-29 VITALS — BP 138/79 | HR 95 | Temp 97.3°F | Resp 20 | Ht 72.0 in | Wt 313.0 lb

## 2022-09-29 DIAGNOSIS — M79606 Pain in leg, unspecified: Secondary | ICD-10-CM | POA: Diagnosis not present

## 2022-09-29 DIAGNOSIS — N186 End stage renal disease: Secondary | ICD-10-CM

## 2022-09-29 LAB — VAS US ABI WITH/WO TBI
Left ABI: 1.11
Right ABI: 1.11

## 2022-09-29 NOTE — Progress Notes (Signed)
Patient ID: Nathan Campbell, male   DOB: May 21, 1975, 47 y.o.   MRN: [id number]  Reason for Consult: Follow-up   Referred by Wallis Bamberg, PA-C  Subjective:     HPI:  Nathan Campbell is a 47 y.o. male history of Tuesdays Thursdays and Saturdays via left arm AV fistula which is now working well.  He has had revision of the fistula in the past but no current issues.  He states that more recently he has developed pain particularly in the right hip radiating down the right leg.  He does have a history of scoliosis but no recent injuries or surgical interventions.  He does not have any tissue loss or ulceration in his feet and he walks without any claudication-like symptoms.  Past Medical History:  Diagnosis Date   Acute combined systolic and diastolic CHF, NYHA class 4 (HCC) 06/10/2016   Anemia    Cardiomyopathy (HCC) 06/14/2016   CHF (congestive heart failure) (HCC)    COVID 10/2020   Dyspnea    ESRD on hemodialysis (HCC)    TTS at [facility]   Hypertension    Hypertensive heart and kidney disease with heart failure (HCC) 06/14/2016   Hypertensive heart disease with congestive heart failure (HCC)    Obesity    Family History  Problem Relation Age of Onset   Hypertension Mother    Diabetes Father    Hypertension Father    Hypertension Sister    Hypertension Paternal Grandmother    Diabetes Mellitus II Paternal Grandfather    Heart disease Cousin    Heart disease Cousin    Colon cancer Cousin    Esophageal cancer Neg Hx    Rectal cancer Neg Hx    Stomach cancer Neg Hx    Past Surgical History:  Procedure Laterality Date   A/V FISTULAGRAM Left 05/03/2022   Procedure: A/V Fistulagram;  Surgeon: Chuck Hint, MD;  Location: [facility] INVASIVE CV LAB;  Service: Cardiovascular;  Laterality: Left;   AV FISTULA PLACEMENT Left 11/11/2020   Procedure: LEFT ARM First Stage Basilic Vein Fistula Creation;  Surgeon: Maeola Harman, MD;  Location: [facility] OR;  Service: Vascular;   Laterality: Left;   BASCILIC VEIN TRANSPOSITION Left 01/23/2021   Procedure: Second Stage Basilic Vein Transposition of Left Arm Arteriovenous  Fistula;  Surgeon: Maeola Harman, MD;  Location: [facility] OR;  Service: Vascular;  Laterality: Left;  Block  to LMA    COLONOSCOPY     FISTULA SUPERFICIALIZATION Left 05/07/2022   Procedure: PLICATION OF ANEURYSM, LEFT ARM FISTULA;  Surgeon: Chuck Hint, MD;  Location: [facility] OR;  Service: Vascular;  Laterality: Left;   NO PAST SURGERIES     PERIPHERAL VASCULAR BALLOON ANGIOPLASTY Left 05/03/2022   Procedure: PERIPHERAL VASCULAR BALLOON ANGIOPLASTY;  Surgeon: Chuck Hint, MD;  Location: [facility] INVASIVE CV LAB;  Service: Cardiovascular;  Laterality: Left;  AVF    Short Social History:  Social History   Tobacco Use   Smoking status: Former    Current packs/day: 0.00    Types: Cigarettes    Quit date: 09/2015    Years since quitting: 7.0   Smokeless tobacco: Never   Tobacco comments:    occasionally  Substance Use Topics   Alcohol use: Not Currently    No Known Allergies  Current Outpatient Medications  Medication Sig Dispense Refill   acetaminophen (TYLENOL) 325 MG tablet Take 2 tablets (650 mg total) by mouth every 6 (six) hours as needed for moderate pain. 30  tablet 0   allopurinol (ZYLOPRIM) 100 MG tablet Take 1 tablet (100 mg total) by mouth daily. Do not begin until pain in right knee is resolved. 30 tablet 1   amLODipine (NORVASC) 10 MG tablet Take 1 tablet (10 mg total) by mouth daily. 30 tablet 1   AURYXIA 1 GM 210 MG(Fe) tablet Take 420-630 mg by mouth See admin instructions. Take 630 mg with each meal and 420 to 630 mg with each snack     B Complex-C-Zn-Folic Acid (DIALYVITE 800/ZINC PO) Take 1 tablet by mouth daily.     benzonatate (TESSALON) 100 MG capsule Take 1 capsule (100 mg total) by mouth 3 (three) times daily as needed for cough. 30 capsule 0   carvedilol (COREG) 25 MG tablet Take 25 mg by mouth 2 (two)  times daily.     cephALEXin (KEFLEX) 500 MG capsule Take 1 capsule (500 mg total) by mouth every 12 (twelve) hours. 20 capsule 0   isosorbide mononitrate (ISMO) 10 MG tablet Take 10 mg by mouth daily.     oxyCODONE-acetaminophen (PERCOCET) 5-325 MG tablet Take 1 tablet by mouth every 4 (four) hours as needed for severe pain. 15 tablet 0   promethazine-dextromethorphan (PROMETHAZINE-DM) 6.25-15 MG/5ML syrup Take 5 mLs by mouth 3 (three) times daily as needed for cough. 200 mL 0   torsemide (DEMADEX) 20 MG tablet Take 60 mg by mouth 2 (two) times daily.     Current Facility-Administered Medications  Medication Dose Route Frequency Provider Last Rate Last Admin   0.9 %  sodium chloride infusion  250 mL Intravenous PRN Maeola Harman, MD       sodium chloride flush (NS) 0.9 % injection 3 mL  3 mL Intravenous Q12H Maeola Harman, MD        Review of Systems  Constitutional:  Constitutional negative. HENT: HENT negative.  Eyes: Eyes negative.  Respiratory: Respiratory negative.  GI: Gastrointestinal negative.  Musculoskeletal: Positive for leg pain and joint pain.  Neurological: Positive for focal weakness and numbness.  Hematologic: Hematologic/lymphatic negative.  Psychiatric: Psychiatric negative.        Objective:  Objective   Vitals:   09/29/22 1548  BP: 138/79  Pulse: 95  Resp: 20  Temp: (!) 97.3 F (36.3 C)  SpO2: 92%  Weight: (!) 313 lb (142 kg)  Height: 6' (1.829 m)   Body mass index is 42.45 kg/m.  Physical Exam HENT:     Head: Normocephalic.     Nose: Nose normal.  Eyes:     Pupils: Pupils are equal, round, and reactive to light.  Cardiovascular:     Rate and Rhythm: Normal rate.     Pulses: Normal pulses.  Pulmonary:     Effort: Pulmonary effort is normal.  Abdominal:     General: Abdomen is flat.     Palpations: Abdomen is soft.  Musculoskeletal:     Cervical back: Normal range of motion and neck supple.     Comments: Strong  thrill left upper extremity  Skin:    Capillary Refill: Capillary refill takes less than 2 seconds.  Neurological:     General: No focal deficit present.     Mental Status: He is alert.  Psychiatric:        Mood and Affect: Mood normal.        Thought Content: Thought content normal.        Judgment: Judgment normal.     Data: ABI Findings:  +---------+------------------+-----+-----------+--------+  Right  Rt Pressure (mmHg)IndexWaveform   Comment   +---------+------------------+-----+-----------+--------+  Brachial 140                                         +---------+------------------+-----+-----------+--------+  PTA     156               1.11 multiphasic          +---------+------------------+-----+-----------+--------+  DP      156               1.11 multiphasic          +---------+------------------+-----+-----------+--------+  Great Toe138               0.99 Normal               +---------+------------------+-----+-----------+--------+   +---------+------------------+-----+-----------+-------+  Left    Lt Pressure (mmHg)IndexWaveform   Comment  +---------+------------------+-----+-----------+-------+  PTA     150               1.07 multiphasic         +---------+------------------+-----+-----------+-------+  DP      155               1.11 multiphasic         +---------+------------------+-----+-----------+-------+  Great Toe152               1.09 Normal              +---------+------------------+-----+-----------+-------+   +-------+-----------+-----------+------------+------------+  ABI/TBIToday's ABIToday's TBIPrevious ABIPrevious TBI  +-------+-----------+-----------+------------+------------+  Right 1.11       0.99                                 +-------+-----------+-----------+------------+------------+  Left  1.11       1.09                                  +-------+-----------+-----------+------------+------------+        PPG tracings display appropriate pulsatility.    Summary:  Right: Resting right ankle-brachial index is within normal range. The  right toe-brachial index is normal.   Left: Resting left ankle-brachial index is within normal range. The left  toe-brachial index is normal.      Assessment/Plan:    47 year old male on dialysis via left arm fistula with pain in the right hip radiating down the leg which was concerning for vascular disease.  Thankfully patient has easily palpable pulses with preserved ABIs does not need further vascular evaluation at this time.  He will follow-up with [place] on an as-needed basis for fistula maintenance.     Maeola Harman MD [facility]

## 2022-11-29 ENCOUNTER — Ambulatory Visit
Admission: EM | Admit: 2022-11-29 | Discharge: 2022-11-29 | Disposition: A | Discharge status: Home / Self Care | Payer: Medicare Other | Attending: Internal Medicine | Admitting: Internal Medicine

## 2022-11-29 ENCOUNTER — Ambulatory Visit (INDEPENDENT_AMBULATORY_CARE_PROVIDER_SITE_OTHER): Payer: Medicare Other

## 2022-11-29 DIAGNOSIS — U071 COVID-19: Secondary | ICD-10-CM | POA: Diagnosis not present

## 2022-11-29 DIAGNOSIS — R42 Dizziness and giddiness: Secondary | ICD-10-CM | POA: Diagnosis present

## 2022-11-29 DIAGNOSIS — N186 End stage renal disease: Secondary | ICD-10-CM | POA: Insufficient documentation

## 2022-11-29 DIAGNOSIS — R051 Acute cough: Secondary | ICD-10-CM

## 2022-11-29 DIAGNOSIS — I120 Hypertensive chronic kidney disease with stage 5 chronic kidney disease or end stage renal disease: Secondary | ICD-10-CM | POA: Diagnosis not present

## 2022-11-29 DIAGNOSIS — Z992 Dependence on renal dialysis: Secondary | ICD-10-CM | POA: Diagnosis not present

## 2022-11-29 DIAGNOSIS — B349 Viral infection, unspecified: Secondary | ICD-10-CM

## 2022-11-29 LAB — POCT INFLUENZA A/B
Influenza A, POC: NEGATIVE
Influenza B, POC: NEGATIVE

## 2022-11-29 MED ORDER — PROMETHAZINE-DM 6.25-15 MG/5ML PO SYRP: 5.0000 mL | ORAL_SOLUTION | Freq: Three times a day (TID) | ORAL | 0 refills | Status: DC | PRN

## 2022-11-29 NOTE — ED Triage Notes (Signed)
Pt presents with cough, sneezing, nausea and dizziness x 2 days.   States he has not taken anything at home. Has been able to eat soup. States his nephew has recently been sick.

## 2022-11-29 NOTE — Discharge Instructions (Signed)
The clinic will contact you with the results of the COVID test done today if positive.  Start Promethazine DM as needed for cough.  Please note this medication can make you drowsy.  Do not drink alcohol or drive on this medication.  Lots of rest.  Follow-up with your PCP in 2 days for recheck.  Please go to emergency room for any worsening symptoms.  I hope you feel better soon!

## 2022-11-29 NOTE — ED Provider Notes (Addendum)
[facility]    CSN: [id number] Arrival date & time: 11/29/22  1015      History   Chief Complaint Chief Complaint  Patient presents with   Cough   Nasal Congestion    HPI Nathan Campbell is a 47 y.o. male  presents for evaluation of URI symptoms for 2 days.  Patient has a complex medical history of CHF, cardiomyopathy, end-stage renal disease on hemodialysis, hypertension.  Patient reports associated symptoms of cough, congestion, sneezing. Denies vomiting, diarrhea, sore throat, fevers, ear pain, shortness of breath. Patient does not have a hx of asthma. Patient does have a history of smoking but states he no longer smokes.  Reports sick contacts in his nephew.  Pt has taken nothing OTC for symptoms. Pt has no other concerns at this time.    Cough   Past Medical History:  Diagnosis Date   Acute combined systolic and diastolic CHF, NYHA class 4 (HCC) 06/10/2016   Anemia    Cardiomyopathy (HCC) 06/14/2016   CHF (congestive heart failure) (HCC)    COVID 10/2020   Dyspnea    ESRD on hemodialysis (HCC)    TTS at [facility]   Hypertension    Hypertensive heart and kidney disease with heart failure (HCC) 06/14/2016   Hypertensive heart disease with congestive heart failure (HCC)    Obesity     Patient Active Problem List   Diagnosis Date Noted   Cellulitis and abscess of hand 03/26/2022   ESRD (end stage renal disease) (HCC) 03/26/2022   Hand abscess 03/26/2022   Change in bowel habits 08/13/2020   Constipation 08/13/2020   Acute bilateral low back pain without sciatica 07/04/2019   Acute gout due to renal impairment involving left wrist 04/24/2019   Malignant hypertension 10/14/2016   Hypertensive heart disease with CHF (congestive heart failure) (HCC) 06/14/2016   NICM (nonischemic cardiomyopathy) (HCC) 06/14/2016   Renal failure    Hypertensive urgency 06/10/2016   CKD (chronic kidney disease), stage IV (HCC) 06/10/2016   Normocytic anemia 06/10/2016     Past Surgical History:  Procedure Laterality Date   A/V FISTULAGRAM Left 05/03/2022   Procedure: A/V Fistulagram;  Surgeon: Chuck Hint, MD;  Location: [facility] INVASIVE CV LAB;  Service: Cardiovascular;  Laterality: Left;   AV FISTULA PLACEMENT Left 11/11/2020   Procedure: LEFT ARM First Stage Basilic Vein Fistula Creation;  Surgeon: Maeola Harman, MD;  Location: [facility] OR;  Service: Vascular;  Laterality: Left;   BASCILIC VEIN TRANSPOSITION Left 01/23/2021   Procedure: Second Stage Basilic Vein Transposition of Left Arm Arteriovenous  Fistula;  Surgeon: Maeola Harman, MD;  Location: [facility] OR;  Service: Vascular;  Laterality: Left;  Block  to LMA    COLONOSCOPY     FISTULA SUPERFICIALIZATION Left 05/07/2022   Procedure: PLICATION OF ANEURYSM, LEFT ARM FISTULA;  Surgeon: Chuck Hint, MD;  Location: [facility] OR;  Service: Vascular;  Laterality: Left;   NO PAST SURGERIES     PERIPHERAL VASCULAR BALLOON ANGIOPLASTY Left 05/03/2022   Procedure: PERIPHERAL VASCULAR BALLOON ANGIOPLASTY;  Surgeon: Chuck Hint, MD;  Location: [facility] INVASIVE CV LAB;  Service: Cardiovascular;  Laterality: Left;  AVF       Home Medications    Prior to Admission medications   Medication Sig Start Date End Date Taking? Authorizing Provider  promethazine-dextromethorphan (PROMETHAZINE-DM) 6.25-15 MG/5ML syrup Take 5 mLs by mouth 3 (three) times daily as needed for cough. 11/29/22  Yes Radford Pax, NP  acetaminophen (TYLENOL) 325  MG tablet Take 2 tablets (650 mg total) by mouth every 6 (six) hours as needed for moderate pain. 08/22/22   Wallis Bamberg, PA-C  allopurinol (ZYLOPRIM) 100 MG tablet Take 1 tablet (100 mg total) by mouth daily. Do not begin until pain in right knee is resolved. 09/13/21   Theadora Rama Scales, PA-C  amLODipine (NORVASC) 10 MG tablet Take 1 tablet (10 mg total) by mouth daily. 06/16/16   Clydia Llano, MD  AURYXIA 1 GM 210 MG(Fe) tablet Take 420-630 mg by mouth  See admin instructions. Take 630 mg with each meal and 420 to 630 mg with each snack 03/18/22   [provider]  B Complex-C-Zn-Folic Acid (DIALYVITE 800/ZINC PO) Take 1 tablet by mouth daily.    [provider]  benzonatate (TESSALON) 100 MG capsule Take 1 capsule (100 mg total) by mouth 3 (three) times daily as needed for cough. 08/22/22   Wallis Bamberg, PA-C  carvedilol (COREG) 25 MG tablet Take 25 mg by mouth 2 (two) times daily. 12/13/21   [provider]  cephALEXin (KEFLEX) 500 MG capsule Take 1 capsule (500 mg total) by mouth every 12 (twelve) hours. 03/28/22   Leroy Sea, MD  isosorbide mononitrate (ISMO) 10 MG tablet Take 10 mg by mouth daily. 03/11/22   [provider]  oxyCODONE-acetaminophen (PERCOCET) 5-325 MG tablet Take 1 tablet by mouth every 4 (four) hours as needed for severe pain. 05/07/22 05/07/23  Chuck Hint, MD  torsemide (DEMADEX) 20 MG tablet Take 60 mg by mouth 2 (two) times daily. 10/08/20   [provider]    Family History Family History  Problem Relation Age of Onset   Hypertension Mother    Diabetes Father    Hypertension Father    Hypertension Sister    Hypertension Paternal Grandmother    Diabetes Mellitus II Paternal Grandfather    Heart disease Cousin    Heart disease Cousin    Colon cancer Cousin    Esophageal cancer Neg Hx    Rectal cancer Neg Hx    Stomach cancer Neg Hx     Social History Social History   Tobacco Use   Smoking status: Former    Current packs/day: 0.00    Types: Cigarettes    Quit date: 09/2015    Years since quitting: 7.2   Smokeless tobacco: Never   Tobacco comments:    occasionally  Vaping Use   Vaping status: Some Days  Substance Use Topics   Alcohol use: Not Currently   Drug use: Not Currently     Allergies   Patient has no known allergies.   Review of Systems Review of Systems  HENT:  Positive for congestion and sneezing.   Respiratory:  Positive for  cough.   Gastrointestinal:  Positive for nausea.     Physical Exam Triage Vital Signs ED Triage Vitals  Encounter Vitals Group     BP 11/29/22 1024 (!) 178/114     Systolic BP Percentile --      Diastolic BP Percentile --      Pulse Rate 11/29/22 1024 93     Resp 11/29/22 1024 16     Temp 11/29/22 1024 99.9 F (37.7 C)     Temp Source 11/29/22 1024 Oral     SpO2 11/29/22 1024 91 %     Weight --      Height --      Head Circumference --      Peak Flow --  Pain Score 11/29/22 1023 3     Pain Loc --      Pain Education --      Exclude from Growth Chart --    No data found.  Updated Vital Signs BP (!) 178/114 (BP Location: Right Arm)   Pulse 93   Temp 99.9 F (37.7 C) (Oral)   Resp 16   SpO2 91%   Visual Acuity Right Eye Distance:   Left Eye Distance:   Bilateral Distance:    Right Eye Near:   Left Eye Near:    Bilateral Near:     Physical Exam Vitals and nursing note reviewed.  Constitutional:      General: He is not in acute distress.    Appearance: Normal appearance. He is not ill-appearing or toxic-appearing.  HENT:     Head: Normocephalic and atraumatic.     Right Ear: Tympanic membrane and ear canal normal.     Left Ear: Tympanic membrane and ear canal normal.     Nose: Congestion present.     Mouth/Throat:     Mouth: Mucous membranes are moist.     Pharynx: No oropharyngeal exudate or posterior oropharyngeal erythema.  Eyes:     Pupils: Pupils are equal, round, and reactive to light.  Cardiovascular:     Rate and Rhythm: Normal rate and regular rhythm.     Heart sounds: Normal heart sounds.  Pulmonary:     Effort: Pulmonary effort is normal.     Breath sounds: Normal breath sounds.  Musculoskeletal:     Cervical back: Normal range of motion and neck supple.  Lymphadenopathy:     Cervical: No cervical adenopathy.  Skin:    General: Skin is warm and dry.  Neurological:     General: No focal deficit present.     Mental Status: He is alert  and oriented to person, place, and time.  Psychiatric:        Mood and Affect: Mood normal.        Behavior: Behavior normal.      UC Treatments / Results  Labs (all labs ordered are listed, but only abnormal results are displayed) Labs Reviewed  SARS CORONAVIRUS 2 (TAT 6-24 HRS)  POCT INFLUENZA A/B   Basic metabolic panel Order: [id number] Status: Final result     Visible to patient: Yes (not seen)     Next appt: None   0 Result Notes  important suggestion  Newer results are available. Click to view them now.            Component Ref Range & Units 8 mo ago (03/27/22) 8 mo ago (03/26/22) 1 yr ago (01/23/21) 2 yr ago (11/11/20) 2 yr ago (10/30/20) 2 yr ago (06/21/20) 2 yr ago (06/18/20)  Sodium 135 - 145 mmol/L 137 137 142 140 139 136 139  Potassium 3.5 - 5.1 mmol/L 3.9 4.3 4.0 3.9 4.5 3.5 3.6  Chloride 98 - 111 mmol/L 96 Low  96 Low  105 104 103 100 102  CO2 22 - 32 mmol/L 26 29    27 28   Glucose, Bld 70 - 99 mg/dL 409 High  811 High  CM 109 High  CM 116 High  CM 122 High  CM 114 High  CM 117 High  CM  Comment: Glucose reference range applies only to samples taken after fasting for at least 8 hours.  BUN 6 - 20 mg/dL 52 High  47 High  914 High  108 High  107 High  56 High  54 High   Creatinine, Ser 0.61 - 1.24 mg/dL 16.10 High  9.60 High  9.90 High  7.30 High  7.00 High  4.59 High  4.27 High   Calcium 8.9 - 10.3 mg/dL 9.0 8.9    8.9 8.5 Low   GFR, Estimated >60 mL/min 5 Low  6 Low  CM    15 Low  CM 17 Low  CM  Comment: (NOTE) Calculated using the CKD-EPI Creatinine Equation (2021)  Anion gap 5 - 15 15 12  CM    9 CM 9 CM  Comment: Performed at [facility], [address] [place], [place] [address]  Resulting Agency [facility]         Specimen Collected: 03/27/22 02:35     EKG   Radiology DG Chest 2 View  Result Date: 11/29/2022 CLINICAL DATA:  Cough fever, and shortness of  breath. EXAM: CHEST - 2 VIEW COMPARISON:  Chest x-ray dated August 22, 2022 FINDINGS: Unchanged cardiomegaly. No consolidation. No pleural effusion. Evidence of pneumothorax. IMPRESSION: No radiographic evidence of pneumonia.  Unchanged cardiomegaly. Electronically Signed   By: Allegra Lai M.D.   On: 11/29/2022 11:37    Procedures Procedures (including critical care time)  Medications Ordered in UC Medications - No data to display  Initial Impression / Assessment and Plan / UC Course  I have reviewed the triage vital signs and the nursing notes.  Pertinent labs & imaging results that were available during my care of the patient were reviewed by me and considered in my medical decision making (see chart for details).     Pt is non toxic in appearance.  COVID PCR and will contact if positive.  Chest x-ray negative.  Negative rapid flu. Discussed viral illness and symptomatic treatment.  Promethazine DM for cough.  Side effect profile reviewed.  If COVID test is positive, would be a candidate for molnupiravir. PCP follow up 2 days for recheck. ER precautions reviewed and pt verbalized understanding  Final Clinical Impressions(s) / UC Diagnoses   Final diagnoses:  Acute cough  Viral illness     Discharge Instructions      The clinic will contact you with the results of the COVID test done today if positive.  Start Promethazine DM as needed for cough.  Please note this medication can make you drowsy.  Do not drink alcohol or drive on this medication.  Lots of rest.  Follow-up with your PCP in 2 days for recheck.  Please go to emergency room for any worsening symptoms.  I hope you feel better soon!     ED Prescriptions     Medication Sig Dispense Auth. Provider   promethazine-dextromethorphan (PROMETHAZINE-DM) 6.25-15 MG/5ML syrup Take 5 mLs by mouth 3 (three) times daily as needed for cough. 118 mL Radford Pax, NP      PDMP not reviewed this encounter.   Radford Pax,  NP 11/29/22 1149    Radford Pax, NP 11/29/22 1149    Radford Pax, NP 11/29/22 1150

## 2022-11-30 LAB — SARS CORONAVIRUS 2 (TAT 6-24 HRS): SARS Coronavirus 2: POSITIVE — AB

## 2022-12-13 ENCOUNTER — Telehealth: Payer: Self-pay | Admitting: Emergency Medicine

## 2022-12-13 NOTE — Telephone Encounter (Signed)
Patient returned my follow up call, he is improved but still has some cough.  Return precautions reviewed, he had no questions at this time

## 2023-02-06 ENCOUNTER — Ambulatory Visit
Admission: EM | Admit: 2023-02-06 | Discharge: 2023-02-06 | Disposition: A | Discharge status: Home / Self Care | Payer: Medicare Other | Attending: Internal Medicine | Admitting: Internal Medicine

## 2023-02-06 DIAGNOSIS — J40 Bronchitis, not specified as acute or chronic: Secondary | ICD-10-CM | POA: Diagnosis not present

## 2023-02-06 DIAGNOSIS — J329 Chronic sinusitis, unspecified: Secondary | ICD-10-CM

## 2023-02-06 DIAGNOSIS — Z992 Dependence on renal dialysis: Secondary | ICD-10-CM

## 2023-02-06 DIAGNOSIS — N186 End stage renal disease: Secondary | ICD-10-CM | POA: Diagnosis not present

## 2023-02-06 MED ORDER — PREDNISONE 20 MG PO TABS: ORAL_TABLET | ORAL | 0 refills | Status: DC

## 2023-02-06 MED ORDER — PROMETHAZINE-DM 6.25-15 MG/5ML PO SYRP: 5.0000 mL | ORAL_SOLUTION | Freq: Three times a day (TID) | ORAL | 0 refills | Status: DC | PRN

## 2023-02-06 MED ORDER — AZITHROMYCIN 250 MG PO TABS: ORAL_TABLET | ORAL | 0 refills | Status: DC

## 2023-02-06 NOTE — Discharge Instructions (Signed)
Start prednisone and azithromycin to help with your infection, sinobronchitis. Use cough syrup as needed.

## 2023-02-06 NOTE — ED Provider Notes (Signed)
[name]  Note:  This document was prepared using Conservation officer, historic buildings and may include unintentional dictation errors.  MRN: [id number] DOB: 1975-09-02  Subjective:   Nathan Campbell is a 47 y.o. male presenting for 2-week history of persistent productive cough.  Has been having posttussive emesis, throat pain. No history of asthma.  However, patient does have a history of congestive heart failure, ESRD.  Ejection fraction is preserved, last echocardiogram showed 60%-65% from 2021 and has not had a repeat since then.  No smoking of any kind including cigarettes, cigars, vaping, marijuana use.  Blood sugars are well-controlled.  Current Facility-Administered Medications:    0.9 %  sodium chloride infusion, 250 mL, Intravenous, PRN, Maeola Harman, MD   sodium chloride flush (NS) 0.9 % injection 3 mL, 3 mL, Intravenous, Q12H, Maeola Harman, MD  Current Outpatient Medications:    azithromycin (ZITHROMAX) 250 MG tablet, Day 1: take 2 tablets. Day 2-5: Take 1 tablet daily., Disp: 6 tablet, Rfl: 0   predniSONE (DELTASONE) 20 MG tablet, Take 2 tablets daily with breakfast., Disp: 10 tablet, Rfl: 0   promethazine-dextromethorphan (PROMETHAZINE-DM) 6.25-15 MG/5ML syrup, Take 5 mLs by mouth 3 (three) times daily as needed for cough., Disp: 200 mL, Rfl: 0   acetaminophen (TYLENOL) 325 MG tablet, Take 2 tablets (650 mg total) by mouth every 6 (six) hours as needed for moderate pain., Disp: 30 tablet, Rfl: 0   allopurinol (ZYLOPRIM) 100 MG tablet, Take 1 tablet (100 mg total) by mouth daily. Do not begin until pain in right knee is resolved., Disp: 30 tablet, Rfl: 1   amLODipine (NORVASC) 10 MG tablet, Take 1 tablet (10 mg total) by mouth daily., Disp: 30 tablet, Rfl: 1   AURYXIA 1 GM 210 MG(Fe) tablet, Take 420-630 mg by mouth See admin instructions. Take 630 mg with each meal and 420 to 630 mg with each snack, Disp: , Rfl:    B  Complex-C-Zn-Folic Acid (DIALYVITE 800/ZINC PO), Take 1 tablet by mouth daily., Disp: , Rfl:    benzonatate (TESSALON) 100 MG capsule, Take 1 capsule (100 mg total) by mouth 3 (three) times daily as needed for cough., Disp: 30 capsule, Rfl: 0   carvedilol (COREG) 25 MG tablet, Take 25 mg by mouth 2 (two) times daily., Disp: , Rfl:    cephALEXin (KEFLEX) 500 MG capsule, Take 1 capsule (500 mg total) by mouth every 12 (twelve) hours., Disp: 20 capsule, Rfl: 0   isosorbide mononitrate (ISMO) 10 MG tablet, Take 10 mg by mouth daily., Disp: , Rfl:    oxyCODONE-acetaminophen (PERCOCET) 5-325 MG tablet, Take 1 tablet by mouth every 4 (four) hours as needed for severe pain., Disp: 15 tablet, Rfl: 0   torsemide (DEMADEX) 20 MG tablet, Take 60 mg by mouth 2 (two) times daily., Disp: , Rfl:    No Known Allergies  Past Medical History:  Diagnosis Date   Acute combined systolic and diastolic CHF, NYHA class 4 (HCC) 06/10/2016   Anemia    Cardiomyopathy (HCC) 06/14/2016   CHF (congestive heart failure) (HCC)    COVID 10/2020   Dyspnea    ESRD on hemodialysis (HCC)    TTS at [facility]   Hypertension    Hypertensive heart and kidney disease with heart failure (HCC) 06/14/2016   Hypertensive heart disease with congestive heart failure (HCC)    Obesity      Past Surgical History:  Procedure Laterality Date   A/V FISTULAGRAM Left 05/03/2022  Procedure: A/V Fistulagram;  Surgeon: Chuck Hint, MD;  Location: [facility] INVASIVE CV LAB;  Service: Cardiovascular;  Laterality: Left;   AV FISTULA PLACEMENT Left 11/11/2020   Procedure: LEFT ARM First Stage Basilic Vein Fistula Creation;  Surgeon: Maeola Harman, MD;  Location: [facility] OR;  Service: Vascular;  Laterality: Left;   BASCILIC VEIN TRANSPOSITION Left 01/23/2021   Procedure: Second Stage Basilic Vein Transposition of Left Arm Arteriovenous  Fistula;  Surgeon: Maeola Harman, MD;  Location[facility] OR;  Service: Vascular;  Laterality:  Left;  Block  to LMA    COLONOSCOPY     FISTULA SUPERFICIALIZATION Left 05/07/2022   Procedure: PLICATION OF ANEURYSM, LEFT ARM FISTULA;  Surgeon: Chuck Hint, MD;  Location: [facility] OR;  Service: Vascular;  Laterality: Left;   NO PAST SURGERIES     PERIPHERAL VASCULAR BALLOON ANGIOPLASTY Left 05/03/2022   Procedure: PERIPHERAL VASCULAR BALLOON ANGIOPLASTY;  Surgeon: Chuck Hint, MD;  Location: [facility] INVASIVE CV LAB;  Service: Cardiovascular;  Laterality: Left;  AVF    Family History  Problem Relation Age of Onset   Hypertension Mother    Diabetes Father    Hypertension Father    Hypertension Sister    Hypertension Paternal Grandmother    Diabetes Mellitus II Paternal Grandfather    Heart disease Cousin    Heart disease Cousin    Colon cancer Cousin    Esophageal cancer Neg Hx    Rectal cancer Neg Hx    Stomach cancer Neg Hx     Social History   Tobacco Use   Smoking status: Former    Current packs/day: 0.00    Types: Cigarettes    Quit date: 09/2015    Years since quitting: 7.4   Smokeless tobacco: Never   Tobacco comments:    occasionally  Vaping Use   Vaping status: Former  Substance Use Topics   Alcohol use: Not Currently   Drug use: Not Currently    ROS   Objective:   Vitals: BP 138/86 (BP Location: Right Arm)   Pulse 94   Temp 100 F (37.8 C) (Oral)   Resp 20   SpO2 93%   Physical Exam Constitutional:      General: He is not in acute distress.    Appearance: Normal appearance. He is well-developed and normal weight. He is not ill-appearing, toxic-appearing or diaphoretic.  HENT:     Head: Normocephalic and atraumatic.     Right Ear: Tympanic membrane, ear canal and external ear normal. No drainage, swelling or tenderness. No middle ear effusion. There is no impacted cerumen. Tympanic membrane is not erythematous or bulging.     Left Ear: Tympanic membrane, ear canal and external ear normal. No drainage, swelling or tenderness.  No  middle ear effusion. There is no impacted cerumen. Tympanic membrane is not erythematous or bulging.     Nose: Congestion present. No rhinorrhea.     Mouth/Throat:     Mouth: Mucous membranes are moist.     Pharynx: No oropharyngeal exudate or posterior oropharyngeal erythema.     Comments: Mild postnasal drainage overlying pharynx. Eyes:     General: No scleral icterus.       Right eye: No discharge.        Left eye: No discharge.     Extraocular Movements: Extraocular movements intact.     Conjunctiva/sclera: Conjunctivae normal.  Cardiovascular:     Rate and Rhythm: Normal rate and regular rhythm.     Heart  sounds: Normal heart sounds. No murmur heard.    No friction rub. No gallop.  Pulmonary:     Effort: Pulmonary effort is normal. No respiratory distress.     Breath sounds: No stridor. Rhonchi (mild over mid-lower lung fields bilaterally) present. No wheezing or rales.  Musculoskeletal:     Cervical back: Normal range of motion and neck supple. No rigidity. No muscular tenderness.  Neurological:     General: No focal deficit present.     Mental Status: He is alert and oriented to person, place, and time.  Psychiatric:        Mood and Affect: Mood normal.        Behavior: Behavior normal.        Thought Content: Thought content normal.     Assessment and Plan :   PDMP not reviewed this encounter.  1. Sinobronchitis   2. ESRD on dialysis [facility])    Will manage sinobronchitis with azithromycin and a prednisone course.  These medications are compatible with his ESRD.  Recommend general supportive care.  Will defer imaging for now.  Maintain all other treatments.  Counseled patient on potential for adverse effects with medications prescribed/recommended today, ER and return-to-clinic precautions discussed, patient verbalized understanding.    Wallis Bamberg, PA-C 02/06/23 1314

## 2023-02-06 NOTE — ED Triage Notes (Signed)
Pt c/o prod cough x 2 weeks-states he coughs until vomits and causes sore throat-denies fever-NAD-steady gait

## 2023-03-06 ENCOUNTER — Encounter (HOSPITAL_COMMUNITY): Payer: Self-pay

## 2023-03-06 ENCOUNTER — Other Ambulatory Visit: Payer: Self-pay

## 2023-03-06 ENCOUNTER — Ambulatory Visit
Admission: EM | Admit: 2023-03-06 | Discharge: 2023-03-06 | Disposition: A | Discharge status: Home / Self Care | Payer: Medicare Other | Attending: Family Medicine | Admitting: Family Medicine

## 2023-03-06 ENCOUNTER — Ambulatory Visit (INDEPENDENT_AMBULATORY_CARE_PROVIDER_SITE_OTHER): Payer: Medicare Other

## 2023-03-06 ENCOUNTER — Emergency Department (HOSPITAL_COMMUNITY)
Admission: EM | Admit: 2023-03-06 | Discharge: 2023-03-06 | Disposition: A | Discharge status: Home / Self Care | Payer: Medicare Other | Attending: Emergency Medicine | Admitting: Emergency Medicine

## 2023-03-06 DIAGNOSIS — Z992 Dependence on renal dialysis: Secondary | ICD-10-CM | POA: Diagnosis not present

## 2023-03-06 DIAGNOSIS — I132 Hypertensive heart and chronic kidney disease with heart failure and with stage 5 chronic kidney disease, or end stage renal disease: Secondary | ICD-10-CM | POA: Diagnosis not present

## 2023-03-06 DIAGNOSIS — I5041 Acute combined systolic (congestive) and diastolic (congestive) heart failure: Secondary | ICD-10-CM | POA: Insufficient documentation

## 2023-03-06 DIAGNOSIS — N186 End stage renal disease: Secondary | ICD-10-CM | POA: Diagnosis not present

## 2023-03-06 DIAGNOSIS — Z79899 Other long term (current) drug therapy: Secondary | ICD-10-CM | POA: Diagnosis not present

## 2023-03-06 DIAGNOSIS — R052 Subacute cough: Secondary | ICD-10-CM | POA: Diagnosis not present

## 2023-03-06 DIAGNOSIS — R0989 Other specified symptoms and signs involving the circulatory and respiratory systems: Secondary | ICD-10-CM

## 2023-03-06 DIAGNOSIS — R053 Chronic cough: Secondary | ICD-10-CM | POA: Diagnosis not present

## 2023-03-06 DIAGNOSIS — R059 Cough, unspecified: Secondary | ICD-10-CM | POA: Diagnosis present

## 2023-03-06 DIAGNOSIS — Z8616 Personal history of COVID-19: Secondary | ICD-10-CM | POA: Insufficient documentation

## 2023-03-06 LAB — CBC
HCT: 34.7 % — ABNORMAL LOW (ref 39.0–52.0)
Hemoglobin: 11.3 g/dL — ABNORMAL LOW (ref 13.0–17.0)
MCH: 34 pg (ref 26.0–34.0)
MCHC: 32.6 g/dL (ref 30.0–36.0)
MCV: 104.5 fL — ABNORMAL HIGH (ref 80.0–100.0)
Platelets: 253 10*3/uL (ref 150–400)
RBC: 3.32 MIL/uL — ABNORMAL LOW (ref 4.22–5.81)
RDW: 13.3 % (ref 11.5–15.5)
WBC: 7 10*3/uL (ref 4.0–10.5)
nRBC: 0 % (ref 0.0–0.2)

## 2023-03-06 LAB — BASIC METABOLIC PANEL
Anion gap: 19 — ABNORMAL HIGH (ref 5–15)
BUN: 45 mg/dL — ABNORMAL HIGH (ref 6–20)
CO2: 20 mmol/L — ABNORMAL LOW (ref 22–32)
Calcium: 9.5 mg/dL (ref 8.9–10.3)
Chloride: 99 mmol/L (ref 98–111)
Creatinine, Ser: 11.91 mg/dL — ABNORMAL HIGH (ref 0.61–1.24)
GFR, Estimated: 5 mL/min — ABNORMAL LOW (ref >60)
Glucose, Bld: 94 mg/dL (ref 70–99)
Potassium: 4.7 mmol/L (ref 3.5–5.1)
Sodium: 138 mmol/L (ref 135–145)

## 2023-03-06 LAB — BRAIN NATRIURETIC PEPTIDE: B Natriuretic Peptide: 85.3 pg/mL (ref 0.0–100.0)

## 2023-03-06 MED ORDER — GUAIFENESIN-DM 100-10 MG/5ML PO SYRP: 5.0000 mL | ORAL_SOLUTION | Freq: Three times a day (TID) | ORAL | 0 refills | Status: DC | PRN

## 2023-03-06 MED ORDER — ALBUTEROL SULFATE HFA 108 (90 BASE) MCG/ACT IN AERS: 2.0000 | INHALATION_SPRAY | RESPIRATORY_TRACT | Status: DC | PRN

## 2023-03-06 NOTE — ED Notes (Signed)
Patient is being discharged from the Urgent Care and sent to the Emergency Department via POV . Per [place], [place], patient is in need of higher level of care due to pulmonary vascular congestion. Patient is aware and verbalizes understanding of plan of care.  Vitals:   03/06/23 1510  BP: (!) 155/94  Pulse: 93  Resp: 20  Temp: 99.2 F (37.3 C)  SpO2: 95%

## 2023-03-06 NOTE — Discharge Instructions (Addendum)
Go to dialysis tomorrow as planned.

## 2023-03-06 NOTE — ED Triage Notes (Signed)
Pt c/o cough until he vomits x ~23month-states feels same as when he was here last-NAD-steady gait

## 2023-03-06 NOTE — ED Provider Notes (Signed)
[name] - URGENT CARE CENTER  Note:  This document was prepared using Conservation officer, historic buildings and may include unintentional dictation errors.  MRN: [id number] DOB: 03/27/1975  Subjective:   Nathan Campbell is a 47 y.o. male presenting for 1 month history of persistent coughing, chest tightness.  Has had posttussive emesis.  Was last seen 02/06/2023.  Was managed for sinobronchitis with azithromycin and prednisone.  Notes that he had mild improvement.  Patient does have ESRD, is on torsemide.  Has a history of congestive heart failure, ejection fraction is preserved, last echocardiogram showed 60%-65% from 2021 and has not had a repeat since then.  No smoking of any kind including cigarettes, cigars, vaping, marijuana use.  Blood sugars are well-controlled.    Current Facility-Administered Medications:    0.9 %  sodium chloride infusion, 250 mL, Intravenous, PRN, Maeola Harman, MD   sodium chloride flush (NS) 0.9 % injection 3 mL, 3 mL, Intravenous, Q12H, Maeola Harman, MD  Current Outpatient Medications:    acetaminophen (TYLENOL) 325 MG tablet, Take 2 tablets (650 mg total) by mouth every 6 (six) hours as needed for moderate pain., Disp: 30 tablet, Rfl: 0   allopurinol (ZYLOPRIM) 100 MG tablet, Take 1 tablet (100 mg total) by mouth daily. Do not begin until pain in right knee is resolved., Disp: 30 tablet, Rfl: 1   amLODipine (NORVASC) 10 MG tablet, Take 1 tablet (10 mg total) by mouth daily., Disp: 30 tablet, Rfl: 1   AURYXIA 1 GM 210 MG(Fe) tablet, Take 420-630 mg by mouth See admin instructions. Take 630 mg with each meal and 420 to 630 mg with each snack, Disp: , Rfl:    azithromycin (ZITHROMAX) 250 MG tablet, Day 1: take 2 tablets. Day 2-5: Take 1 tablet daily., Disp: 6 tablet, Rfl: 0   B Complex-C-Zn-Folic Acid (DIALYVITE 800/ZINC PO), Take 1 tablet by mouth daily., Disp: , Rfl:    benzonatate (TESSALON) 100 MG capsule, Take 1 capsule (100 mg  total) by mouth 3 (three) times daily as needed for cough., Disp: 30 capsule, Rfl: 0   carvedilol (COREG) 25 MG tablet, Take 25 mg by mouth 2 (two) times daily., Disp: , Rfl:    cephALEXin (KEFLEX) 500 MG capsule, Take 1 capsule (500 mg total) by mouth every 12 (twelve) hours., Disp: 20 capsule, Rfl: 0   isosorbide mononitrate (ISMO) 10 MG tablet, Take 10 mg by mouth daily., Disp: , Rfl:    oxyCODONE-acetaminophen (PERCOCET) 5-325 MG tablet, Take 1 tablet by mouth every 4 (four) hours as needed for severe pain., Disp: 15 tablet, Rfl: 0   predniSONE (DELTASONE) 20 MG tablet, Take 2 tablets daily with breakfast., Disp: 10 tablet, Rfl: 0   promethazine-dextromethorphan (PROMETHAZINE-DM) 6.25-15 MG/5ML syrup, Take 5 mLs by mouth 3 (three) times daily as needed for cough., Disp: 200 mL, Rfl: 0   torsemide (DEMADEX) 20 MG tablet, Take 60 mg by mouth 2 (two) times daily., Disp: , Rfl:    No Known Allergies  Past Medical History:  Diagnosis Date   Acute combined systolic and diastolic CHF, NYHA class 4 (HCC) 06/10/2016   Anemia    Cardiomyopathy (HCC) 06/14/2016   CHF (congestive heart failure) (HCC)    COVID 10/2020   Dyspnea    ESRD on hemodialysis (HCC)    TTS at [facility]   Hypertension    Hypertensive heart and kidney disease with heart failure (HCC) 06/14/2016   Hypertensive heart disease with congestive heart failure (HCC)  Obesity      Past Surgical History:  Procedure Laterality Date   A/V FISTULAGRAM Left 05/03/2022   Procedure: A/V Fistulagram;  Surgeon: Chuck Hint, MD;  Location: [facility] INVASIVE CV LAB;  Service: Cardiovascular;  Laterality: Left;   AV FISTULA PLACEMENT Left 11/11/2020   Procedure: LEFT ARM First Stage Basilic Vein Fistula Creation;  Surgeon: Maeola Harman, MD;  Location: [facility] OR;  Service: Vascular;  Laterality: Left;   BASCILIC VEIN TRANSPOSITION Left 01/23/2021   Procedure: Second Stage Basilic Vein Transposition of Left Arm Arteriovenous   Fistula;  Surgeon: Maeola Harman, MD;  Location: [facility] OR;  Service: Vascular;  Laterality: Left;  Block  to LMA    COLONOSCOPY     FISTULA SUPERFICIALIZATION Left 05/07/2022   Procedure: PLICATION OF ANEURYSM, LEFT ARM FISTULA;  Surgeon: Chuck Hint, MD;  Location: [facility] OR;  Service: Vascular;  Laterality: Left;   NO PAST SURGERIES     PERIPHERAL VASCULAR BALLOON ANGIOPLASTY Left 05/03/2022   Procedure: PERIPHERAL VASCULAR BALLOON ANGIOPLASTY;  Surgeon: Chuck Hint, MD;  Location: [facility] INVASIVE CV LAB;  Service: Cardiovascular;  Laterality: Left;  AVF    Family History  Problem Relation Age of Onset   Hypertension Mother    Diabetes Father    Hypertension Father    Hypertension Sister    Hypertension Paternal Grandmother    Diabetes Mellitus II Paternal Grandfather    Heart disease Cousin    Heart disease Cousin    Colon cancer Cousin    Esophageal cancer Neg Hx    Rectal cancer Neg Hx    Stomach cancer Neg Hx     Social History   Tobacco Use   Smoking status: Former    Current packs/day: 0.00    Types: Cigarettes    Quit date: 09/2015    Years since quitting: 7.4   Smokeless tobacco: Never   Tobacco comments:    occasionally  Vaping Use   Vaping status: Former  Substance Use Topics   Alcohol use: Not Currently   Drug use: Not Currently    ROS   Objective:   Vitals: BP (!) 155/94 (BP Location: Right Arm)   Pulse 93   Temp 99.2 F (37.3 C) (Oral)   Resp 20   SpO2 95%   Physical Exam Constitutional:      General: He is not in acute distress.    Appearance: Normal appearance. He is well-developed. He is not ill-appearing, toxic-appearing or diaphoretic.  HENT:     Head: Normocephalic and atraumatic.     Right Ear: External ear normal.     Left Ear: External ear normal.     Nose: Nose normal.     Mouth/Throat:     Mouth: Mucous membranes are moist.  Eyes:     General: No scleral icterus.       Right eye: No discharge.         Left eye: No discharge.     Extraocular Movements: Extraocular movements intact.  Cardiovascular:     Rate and Rhythm: Normal rate and regular rhythm.     Heart sounds: Normal heart sounds. No murmur heard.    No friction rub. No gallop.  Pulmonary:     Effort: Pulmonary effort is normal. No respiratory distress.     Breath sounds: No stridor. No wheezing, rhonchi or rales.     Comments: Decreased lung sounds throughout. Neurological:     Mental Status: He is alert and oriented to  person, place, and time.  Psychiatric:        Mood and Affect: Mood normal.        Behavior: Behavior normal.        Thought Content: Thought content normal.     DG Chest 2 View Result Date: 03/06/2023 CLINICAL DATA:  Persistent cough . EXAM: CHEST - 2 VIEW COMPARISON:  11/29/2022 . FINDINGS: Low lung volumes. The cardio pericardial silhouette is enlarged. There is pulmonary vascular congestion without overt pulmonary edema. No acute bony abnormality. IMPRESSION: Low lung volumes with pulmonary vascular congestion. Electronically Signed   By: Kennith Center M.D.   On: 03/06/2023 15:36     Assessment and Plan :   PDMP not reviewed this encounter.  1. Pulmonary vascular congestion   2. Persistent cough for 3 weeks or longer   3. ESRD on dialysis [facility])    Patient is in need of a higher level of testing and care than we can provide in the urgent care system.  He has failed outpatient management and has been found to have low lung volumes with pulmonary vascular congestion and persistent coughing with posttussive emesis.  Given his ESRD, recommended further testing and intervention through the emergency room.  He contracts for safety and will present there now.   Wallis Bamberg, [place] 03/06/23 1553

## 2023-03-06 NOTE — ED Triage Notes (Signed)
Pt arrives via POV. Pt reports sob with exertion for about 1 month. Hx of CHF. Pt sent to ed from UC. Pt AxOx4. Pt denies chest pain. Pt AxOx4.

## 2023-03-06 NOTE — ED Provider Notes (Signed)
[place] EMERGENCY DEPARTMENT AT [facility] Provider Note   CSN: [id number] Arrival date & time: 03/06/23  8841     History  Chief Complaint  Patient presents with   Shortness of Breath   Congestive Heart Failure   Hypertension    Nathan Campbell is a 47 y.o. male.   Shortness of Breath Congestive Heart Failure Associated symptoms include shortness of breath.  Hypertension Associated symptoms include shortness of breath.  Patient with 3 weeks of shortness of breath and cough.  Seen in urgent care.  Had x-ray that reportedly showed potential CHF.  Is a dialysis patient.  Was dialyzed yesterday with today being Sunday.  States had trouble breathing due to shortness of breath.  States cough with some minimal sputum production.  States had difficulty laying flat.    Past Medical History:  Diagnosis Date   Acute combined systolic and diastolic CHF, NYHA class 4 (HCC) 06/10/2016   Anemia    Cardiomyopathy (HCC) 06/14/2016   CHF (congestive heart failure) (HCC)    COVID 10/2020   Dyspnea    ESRD on hemodialysis (HCC)    TTS at [facility]   Hypertension    Hypertensive heart and kidney disease with heart failure (HCC) 06/14/2016   Hypertensive heart disease with congestive heart failure (HCC)    Obesity     Home Medications Prior to Admission medications   Medication Sig Start Date End Date Taking? Authorizing Provider  guaiFENesin-dextromethorphan (ROBITUSSIN DM) 100-10 MG/5ML syrup Take 5 mLs by mouth 3 (three) times daily as needed for cough. 03/06/23  Yes Benjiman Core, MD  acetaminophen (TYLENOL) 325 MG tablet Take 2 tablets (650 mg total) by mouth every 6 (six) hours as needed for moderate pain. 08/22/22   Wallis Bamberg, PA-C  allopurinol (ZYLOPRIM) 100 MG tablet Take 1 tablet (100 mg total) by mouth daily. Do not begin until pain in right knee is resolved. 09/13/21   Theadora Rama Scales, PA-C  amLODipine (NORVASC) 10 MG tablet Take 1 tablet (10 mg  total) by mouth daily. 06/16/16   Clydia Llano, MD  AURYXIA 1 GM 210 MG(Fe) tablet Take 420-630 mg by mouth See admin instructions. Take 630 mg with each meal and 420 to 630 mg with each snack 03/18/22   [provider]  azithromycin (ZITHROMAX) 250 MG tablet Day 1: take 2 tablets. Day 2-5: Take 1 tablet daily. 02/06/23   Wallis Bamberg, PA-C  B Complex-C-Zn-Folic Acid (DIALYVITE 800/ZINC PO) Take 1 tablet by mouth daily.    [provider]  benzonatate (TESSALON) 100 MG capsule Take 1 capsule (100 mg total) by mouth 3 (three) times daily as needed for cough. 08/22/22   Wallis Bamberg, PA-C  carvedilol (COREG) 25 MG tablet Take 25 mg by mouth 2 (two) times daily. 12/13/21   [provider]  cephALEXin (KEFLEX) 500 MG capsule Take 1 capsule (500 mg total) by mouth every 12 (twelve) hours. 03/28/22   Leroy Sea, MD  isosorbide mononitrate (ISMO) 10 MG tablet Take 10 mg by mouth daily. 03/11/22   [provider]  oxyCODONE-acetaminophen (PERCOCET) 5-325 MG tablet Take 1 tablet by mouth every 4 (four) hours as needed for severe pain. 05/07/22 05/07/23  Chuck Hint, MD  predniSONE (DELTASONE) 20 MG tablet Take 2 tablets daily with breakfast. 02/06/23   Wallis Bamberg, PA-C  promethazine-dextromethorphan (PROMETHAZINE-DM) 6.25-15 MG/5ML syrup Take 5 mLs by mouth 3 (three) times daily as needed for cough. 02/06/23   Wallis Bamberg, PA-C  torsemide (DEMADEX) 20 MG tablet Take 60 mg by mouth 2 (two) times daily. 10/08/20   [provider]      Allergies    Patient has no known allergies.    Review of Systems   Review of Systems  Respiratory:  Positive for shortness of breath.     Physical Exam Updated Vital Signs BP (!) 154/100   Pulse 80   Temp 99.4 F (37.4 C) (Oral)   Resp 20   Ht 6' (1.829 m)   Wt (!) 142.9 kg   SpO2 97%   BMI 42.72 kg/m  Physical Exam Vitals and nursing note reviewed.  Cardiovascular:     Rate and Rhythm: Normal rate and  regular rhythm.  Pulmonary:     Breath sounds: Rales present.  Chest:     Chest wall: No tenderness.  Abdominal:     Tenderness: There is no abdominal tenderness.  Musculoskeletal:     Right lower leg: Edema present.     Left lower leg: Edema present.  Skin:    General: Skin is warm.  Neurological:     Mental Status: He is alert and oriented to person, place, and time.     ED Results / Procedures / Treatments   Labs (all labs ordered are listed, but only abnormal results are displayed) Labs Reviewed  BASIC METABOLIC PANEL - Abnormal; Notable for the following components:      Result Value   CO2 20 (*)    BUN 45 (*)    Creatinine, Ser 11.91 (*)    GFR, Estimated 5 (*)    Anion gap 19 (*)    All other components within normal limits  CBC - Abnormal; Notable for the following components:   RBC 3.32 (*)    Hemoglobin 11.3 (*)    HCT 34.7 (*)    MCV 104.5 (*)    All other components within normal limits  BRAIN NATRIURETIC PEPTIDE    EKG EKG Interpretation Date/Time:  Sunday March 06 2023 17:18:57 EST Ventricular Rate:  89 PR Interval:  171 QRS Duration:  88 QT Interval:  389 QTC Calculation: 474 R Axis:   12  Text Interpretation: Sinus rhythm LAE, consider biatrial enlargement Probable left ventricular hypertrophy ST elev, probable normal early repol pattern Confirmed by Benjiman Core [phone]) on 03/06/2023 6:34:52 PM  Radiology DG Chest 2 View Result Date: 03/06/2023 CLINICAL DATA:  Persistent cough . EXAM: CHEST - 2 VIEW COMPARISON:  11/29/2022 . FINDINGS: Low lung volumes. The cardio pericardial silhouette is enlarged. There is pulmonary vascular congestion without overt pulmonary edema. No acute bony abnormality. IMPRESSION: Low lung volumes with pulmonary vascular congestion. Electronically Signed   By: Kennith Center M.D.   On: 03/06/2023 15:36    Procedures Procedures    Medications Ordered in ED Medications  albuterol (VENTOLIN HFA) 108 (90 Base)  MCG/ACT inhaler 2 puff (has no administration in time range)    ED Course/ Medical Decision Making/ A&P                                 Medical Decision Making Amount and/or Complexity of Data Reviewed Labs: ordered.   Patient with shortness of breath and cough.  Has had for weeks now.  Went to urgent care today.  Found to have potential volume overload/vascular ingestion on x-ray.  Dialyzed yesterday.  Does admit to some indiscretions in terms of his fluid status.  Mild hypoxia with ambulating down to 89%.  However blood work reassuring.  Potassium reassuring.  I doubt he I can get him dialyzed tonight and does not appear to need it emergently.  With due for tomorrow morning I think discharge home with short-term outpatient dialysis is the best course from right now.  Will give some symptomatic treatment.  Appears stable for discharge home.       Final Clinical Impression(s) / ED Diagnoses Final diagnoses:  End stage renal disease on dialysis [facility])  Subacute cough    Rx / DC Orders ED Discharge Orders          Ordered    guaiFENesin-dextromethorphan (ROBITUSSIN DM) 100-10 MG/5ML syrup  3 times daily PRN        03/06/23 1918              Benjiman Core, MD 03/06/23 1922

## 2023-03-06 NOTE — ED Notes (Signed)
I walked with patient with oximeter on. Saturations started at 69 and then went to 80. It stayed at 92 for a while, but by the end of the walk, it went down to 89.

## 2023-03-06 NOTE — Discharge Instructions (Addendum)
Please go straight to the emergency room as you are in need of a higher level of care than we can provide in the urgent care setting especially given your ESRD. Your chest x-ray showed you have pulmonary vascular congestion and are failing treatment with torsemide. This requires more testing and intervention that you can get through emergency and hospital care.

## 2023-09-14 ENCOUNTER — Encounter (HOSPITAL_BASED_OUTPATIENT_CLINIC_OR_DEPARTMENT_OTHER): Payer: Self-pay

## 2023-09-14 ENCOUNTER — Ambulatory Visit: Payer: Self-pay | Admitting: Nurse Practitioner

## 2023-09-14 ENCOUNTER — Ambulatory Visit (INDEPENDENT_AMBULATORY_CARE_PROVIDER_SITE_OTHER)

## 2023-09-14 ENCOUNTER — Other Ambulatory Visit: Payer: Self-pay

## 2023-09-14 ENCOUNTER — Ambulatory Visit
Admission: EM | Admit: 2023-09-14 | Discharge: 2023-09-14 | Disposition: A | Discharge status: Other Acute Inpatient Hospital | Attending: Family Medicine | Admitting: Family Medicine

## 2023-09-14 DIAGNOSIS — F17210 Nicotine dependence, cigarettes, uncomplicated: Secondary | ICD-10-CM | POA: Diagnosis not present

## 2023-09-14 DIAGNOSIS — Z992 Dependence on renal dialysis: Secondary | ICD-10-CM | POA: Insufficient documentation

## 2023-09-14 DIAGNOSIS — R0989 Other specified symptoms and signs involving the circulatory and respiratory systems: Secondary | ICD-10-CM

## 2023-09-14 DIAGNOSIS — I132 Hypertensive heart and chronic kidney disease with heart failure and with stage 5 chronic kidney disease, or end stage renal disease: Secondary | ICD-10-CM | POA: Diagnosis not present

## 2023-09-14 DIAGNOSIS — Z8616 Personal history of COVID-19: Secondary | ICD-10-CM | POA: Diagnosis not present

## 2023-09-14 DIAGNOSIS — R051 Acute cough: Secondary | ICD-10-CM | POA: Diagnosis not present

## 2023-09-14 DIAGNOSIS — R0682 Tachypnea, not elsewhere classified: Secondary | ICD-10-CM | POA: Diagnosis not present

## 2023-09-14 DIAGNOSIS — I509 Heart failure, unspecified: Secondary | ICD-10-CM | POA: Insufficient documentation

## 2023-09-14 DIAGNOSIS — N186 End stage renal disease: Secondary | ICD-10-CM | POA: Insufficient documentation

## 2023-09-14 DIAGNOSIS — J40 Bronchitis, not specified as acute or chronic: Secondary | ICD-10-CM | POA: Diagnosis not present

## 2023-09-14 DIAGNOSIS — R058 Other specified cough: Secondary | ICD-10-CM | POA: Diagnosis present

## 2023-09-14 LAB — RESP PANEL BY RT-PCR (RSV, FLU A&B, COVID)  RVPGX2
Influenza A by PCR: NEGATIVE
Influenza B by PCR: NEGATIVE
Resp Syncytial Virus by PCR: NEGATIVE
SARS Coronavirus 2 by RT PCR: NEGATIVE

## 2023-09-14 NOTE — ED Notes (Signed)
 Patient is being discharged from the Urgent Care and sent to the Emergency Department via POV . Per Myla Bold, NP, patient is in need of higher level of care due to needing higher level of care. Patient is aware and verbalizes understanding of plan of care.  Vitals:   09/14/23 1834 09/14/23 1907  BP: (!) 176/112 (!) 164/96  Pulse: 97   Resp: (!) 24 (!) 26  Temp: 99.3 F (37.4 C)   SpO2: 93%

## 2023-09-14 NOTE — ED Triage Notes (Addendum)
 Pt was just seen today at [facility] for URI symptoms he has had x 1 week.  +cough and coughing up phlegm Pt has dialysis tomorrow Xray results from today   Cardiomegaly with pulmonary vascular congestion. Electronically Signed   By: Suzen Dials M.D.   On: 09/14/2023 19:07    UC requested he come to ED for further evaluation.

## 2023-09-14 NOTE — ED Provider Notes (Signed)
 [facility]    CSN: [id number] Arrival date & time: 09/14/23  1758      History   Chief Complaint No chief complaint on file.   HPI Gyan Cambre is a 48 y.o. male  presents for evaluation of URI symptoms for 7 days.  Patient has a past medical history of end-stage renal disease on hemodialysis Tuesday Thursday Saturday, CHF, hypertension, obesity.  patient reports associated symptoms of productive cough. Denies N/V/D, fevers, sore throat, ear pain, body aches, shortness of breath.  Denies missing dialysis and denies any lower extremity swelling orthopnea.  Patient does not have a hx of asthma. Patient is an occasional smoker.   Reports no sick contacts.  Pt has taken nothing OTC for symptoms. Pt has no other concerns at this time.   HPI  Past Medical History:  Diagnosis Date   Acute combined systolic and diastolic CHF, NYHA class 4 (HCC) 06/10/2016   Anemia    Cardiomyopathy (HCC) 06/14/2016   CHF (congestive heart failure) (HCC)    COVID 10/2020   Dyspnea    ESRD on hemodialysis (HCC)    TTS at [facility]   Hypertension    Hypertensive heart and kidney disease with heart failure (HCC) 06/14/2016   Hypertensive heart disease with congestive heart failure (HCC)    Obesity     Patient Active Problem List   Diagnosis Date Noted   Cellulitis and abscess of hand 03/26/2022   ESRD (end stage renal disease) (HCC) 03/26/2022   Hand abscess 03/26/2022   Change in bowel habits 08/13/2020   Constipation 08/13/2020   Acute bilateral low back pain without sciatica 07/04/2019   Acute gout due to renal impairment involving left wrist 04/24/2019   Malignant hypertension 10/14/2016   Hypertensive heart disease with CHF (congestive heart failure) (HCC) 06/14/2016   NICM (nonischemic cardiomyopathy) (HCC) 06/14/2016   Renal failure    Hypertensive urgency 06/10/2016   CKD (chronic kidney disease), stage IV (HCC) 06/10/2016   Normocytic anemia 06/10/2016    Past Surgical  History:  Procedure Laterality Date   A/V FISTULAGRAM Left 05/03/2022   Procedure: A/V Fistulagram;  Surgeon: Eliza Lonni RAMAN, MD;  Location[facility] CV LAB;  Service: Cardiovascular;  Laterality: Left;   AV FISTULA PLACEMENT Left 11/11/2020   Procedure: LEFT ARM First Stage Basilic Vein Fistula Creation;  Surgeon: Sheree Penne Lonni, MD;  Location: [facility] OR;  Service: Vascular;  Laterality: Left;   BASCILIC VEIN TRANSPOSITION Left 01/23/2021   Procedure: Second Stage Basilic Vein Transposition of Left Arm Arteriovenous  Fistula;  Surgeon: Sheree Penne Lonni, MD;  Location: [facility] OR;  Service: Vascular;  Laterality: Left;  Block  to LMA    COLONOSCOPY     FISTULA SUPERFICIALIZATION Left 05/07/2022   Procedure: PLICATION OF ANEURYSM, LEFT ARM FISTULA;  Surgeon: Eliza Lonni RAMAN, MD;  Location: [facility] OR;  Service: Vascular;  Laterality: Left;   NO PAST SURGERIES     PERIPHERAL VASCULAR BALLOON ANGIOPLASTY Left 05/03/2022   Procedure: PERIPHERAL VASCULAR BALLOON ANGIOPLASTY;  Surgeon: Eliza Lonni RAMAN, MD;  Location: [facility] INVASIVE CV LAB;  Service: Cardiovascular;  Laterality: Left;  AVF       Home Medications    Prior to Admission medications   Medication Sig Start Date End Date Taking? Authorizing Provider  acetaminophen  (TYLENOL ) 325 MG tablet Take 2 tablets (650 mg total) by mouth every 6 (six) hours as needed for moderate pain. 08/22/22   Christopher Savannah, PA-C  allopurinol  (ZYLOPRIM ) 100 MG tablet  Take 1 tablet (100 mg total) by mouth daily. Do not begin until pain in right knee is resolved. 09/13/21   Joesph Shaver Scales, PA-C  amLODipine  (NORVASC ) 10 MG tablet Take 1 tablet (10 mg total) by mouth daily. 06/16/16   Gabriel Noa, MD  AURYXIA  1 GM 210 MG(Fe) tablet Take 420-630 mg by mouth See admin instructions. Take 630 mg with each meal and 420 to 630 mg with each snack 03/18/22   [provider]  azithromycin  (ZITHROMAX ) 250 MG tablet Day 1: take 2 tablets. Day  2-5: Take 1 tablet daily. 02/06/23   Christopher Savannah, PA-C  B Complex-C-Zn-Folic Acid (DIALYVITE 800/ZINC PO) Take 1 tablet by mouth daily.    [provider]  benzonatate  (TESSALON ) 100 MG capsule Take 1 capsule (100 mg total) by mouth 3 (three) times daily as needed for cough. 08/22/22   Christopher Savannah, PA-C  carvedilol  (COREG ) 25 MG tablet Take 25 mg by mouth 2 (two) times daily. 12/13/21   [provider]  cephALEXin  (KEFLEX ) 500 MG capsule Take 1 capsule (500 mg total) by mouth every 12 (twelve) hours. 03/28/22   Singh, Prashant K, MD  guaiFENesin -dextromethorphan (ROBITUSSIN DM) 100-10 MG/5ML syrup Take 5 mLs by mouth 3 (three) times daily as needed for cough. 03/06/23   Patsey Lot, MD  isosorbide  mononitrate (ISMO ) 10 MG tablet Take 10 mg by mouth daily. 03/11/22   [provider]  predniSONE  (DELTASONE ) 20 MG tablet Take 2 tablets daily with breakfast. 02/06/23   Christopher Savannah, PA-C  promethazine -dextromethorphan (PROMETHAZINE -DM) 6.25-15 MG/5ML syrup Take 5 mLs by mouth 3 (three) times daily as needed for cough. 02/06/23   Christopher Savannah, PA-C  torsemide  (DEMADEX ) 20 MG tablet Take 60 mg by mouth 2 (two) times daily. 10/08/20   [provider]    Family History Family History  Problem Relation Age of Onset   Hypertension Mother    Diabetes Father    Hypertension Father    Hypertension Sister    Hypertension Paternal Grandmother    Diabetes Mellitus II Paternal Grandfather    Heart disease Cousin    Heart disease Cousin    Colon cancer Cousin    Esophageal cancer Neg Hx    Rectal cancer Neg Hx    Stomach cancer Neg Hx     Social History Social History   Tobacco Use   Smoking status: Some Days    Current packs/day: 0.00    Types: Cigarettes    Last attempt to quit: 09/2015    Years since quitting: 8.0   Smokeless tobacco: Never   Tobacco comments:    occasionally  Vaping Use   Vaping status: Former  Substance Use Topics   Alcohol use: Not  Currently   Drug use: Not Currently     Allergies   Patient has no known allergies.   Review of Systems Review of Systems  Respiratory:  Positive for cough.      Physical Exam Triage Vital Signs ED Triage Vitals  Encounter Vitals Group     BP 09/14/23 1834 (!) 176/112     Girls Systolic BP Percentile --      Girls Diastolic BP Percentile --      Boys Systolic BP Percentile --      Boys Diastolic BP Percentile --      Pulse Rate 09/14/23 1834 97     Resp 09/14/23 1834 (!) 24     Temp 09/14/23 1834 99.3 F (37.4 C)  Temp Source 09/14/23 1834 Oral     SpO2 09/14/23 1834 93 %     Weight --      Height --      Head Circumference --      Peak Flow --      Pain Score 09/14/23 1831 0     Pain Loc --      Pain Education --      Exclude from Growth Chart --    No data found.  Updated Vital Signs BP (!) 164/96   Pulse 97   Temp 99.3 F (37.4 C) (Oral)   Resp (!) 26   SpO2 93%   Visual Acuity Right Eye Distance:   Left Eye Distance:   Bilateral Distance:    Right Eye Near:   Left Eye Near:    Bilateral Near:     Physical Exam Vitals and nursing note reviewed.  Constitutional:      General: He is not in acute distress.    Appearance: Normal appearance. He is not ill-appearing or toxic-appearing.  HENT:     Head: Normocephalic and atraumatic.     Right Ear: Tympanic membrane and ear canal normal.     Left Ear: Tympanic membrane and ear canal normal.     Nose: Rhinorrhea present. No congestion.     Mouth/Throat:     Mouth: Mucous membranes are moist.     Pharynx: No posterior oropharyngeal erythema.  Eyes:     Pupils: Pupils are equal, round, and reactive to light.  Cardiovascular:     Rate and Rhythm: Normal rate and regular rhythm.     Heart sounds: Normal heart sounds.  Pulmonary:     Breath sounds: Normal breath sounds. No wheezing, rhonchi or rales.     Comments: Patient tachypneic at 28 Musculoskeletal:     Cervical back: Normal range of  motion and neck supple.     Right lower leg: Edema present.     Left lower leg: Edema present.     Comments: Very faint pitting edema +1 bilateral lower extremities  Lymphadenopathy:     Cervical: No cervical adenopathy.  Skin:    General: Skin is warm and dry.  Neurological:     General: No focal deficit present.     Mental Status: He is alert and oriented to person, place, and time.  Psychiatric:        Mood and Affect: Mood normal.        Behavior: Behavior normal.      UC Treatments / Results  Labs (all labs ordered are listed, but only abnormal results are displayed) Labs Reviewed - No data to display  EKG   Radiology DG Chest 2 View Result Date: 09/14/2023 CLINICAL DATA:  Cough x1 week. EXAM: CHEST - 2 VIEW COMPARISON:  March 06, 2023 FINDINGS: The cardiac silhouette is enlarged and unchanged in size. There is prominence of the pulmonary vasculature without overt pulmonary edema. No acute infiltrate, pleural effusion or pneumothorax is identified. The visualized skeletal structures are unremarkable. IMPRESSION: Cardiomegaly with pulmonary vascular congestion. Electronically Signed   By: Suzen Dials M.D.   On: 09/14/2023 19:07    Procedures Procedures (including critical care time)  Medications Ordered in UC Medications - No data to display  Initial Impression / Assessment and Plan / UC Course  I have reviewed the triage vital signs and the nursing notes.  Pertinent labs & imaging results that were available during my care of the patient were reviewed  by me and considered in my medical decision making (see chart for details).     I reviewed exam and symptoms with patient.  Discussed limitations and abilities of urgent care.  Chest x-ray does show vascular congestion and patient does have history of CHF.  He is tachypneic in clinic with low-grade fever.  Given his history and symptoms I did advise he go to the emergency room for further workup.  Patient is in  agreement with plan will go POV to the emergency room. Final Clinical Impressions(s) / UC Diagnoses   Final diagnoses:  Acute cough  Pulmonary vascular congestion  Tachypnea     Discharge Instructions      Go to emergency room for further evaluation of your symptoms     ED Prescriptions   None    PDMP not reviewed this encounter.   Loreda Myla SAUNDERS, NP 09/14/23 [phone]

## 2023-09-14 NOTE — Discharge Instructions (Addendum)
 Go to emergency room for further evaluation of your symptoms

## 2023-09-14 NOTE — ED Triage Notes (Signed)
 Pt c/o productive cough w/yellow mucous and N/Vx1wk. Pt is dialysis pt, Tues, Thurs, Sat. Pt denies missing any dialysis.

## 2023-09-15 ENCOUNTER — Emergency Department (HOSPITAL_BASED_OUTPATIENT_CLINIC_OR_DEPARTMENT_OTHER)
Admission: EM | Admit: 2023-09-15 | Discharge: 2023-09-15 | Disposition: A | Discharge status: Home / Self Care | Source: Intra-hospital | Attending: Emergency Medicine | Admitting: Emergency Medicine

## 2023-09-15 DIAGNOSIS — J40 Bronchitis, not specified as acute or chronic: Secondary | ICD-10-CM

## 2023-09-15 NOTE — Discharge Instructions (Signed)
 You were evaluated in the Emergency Department and after careful evaluation, we did not find any emergent condition requiring admission or further testing in the hospital.  Your exam/testing today is overall reassuring.  Recommend going to your normal dialysis session today and monitoring your symptoms at home as we discussed.  Please return to the Emergency Department if you experience any worsening of your condition.   Thank you for allowing us  to be a part of your care.

## 2023-09-15 NOTE — ED Provider Notes (Signed)
 [facility] Emergency Department Provider Note MRN:  [id number]  Arrival date & time: 09/15/23     Chief Complaint   Cough   History of Present Illness   Nathan Campbell is a 48 y.o. year-old male with a history of CHF, ESRD presenting to the ED with chief complaint of cough.  1 week of productive cough.  Otherwise does not feel sick, no fever, overall feels well.  No chest pain or shortness of breath, no orthopnea, no leg pain or swelling.  Had dialysis on Tuesday, will go again in the morning.  Sent here by urgent care because of abnormal chest x-ray.  Review of Systems  A thorough review of systems was obtained and all systems are negative except as noted in the HPI and PMH.   Patient's Health History    Past Medical History:  Diagnosis Date   Acute combined systolic and diastolic CHF, NYHA class 4 (HCC) 06/10/2016   Anemia    Cardiomyopathy (HCC) 06/14/2016   CHF (congestive heart failure) (HCC)    COVID 10/2020   Dyspnea    ESRD on hemodialysis (HCC)    TTS at [facility]   Hypertension    Hypertensive heart and kidney disease with heart failure (HCC) 06/14/2016   Hypertensive heart disease with congestive heart failure (HCC)    Obesity     Past Surgical History:  Procedure Laterality Date   A/V FISTULAGRAM Left 05/03/2022   Procedure: A/V Fistulagram;  Surgeon: Eliza Lonni RAMAN, MD;  Location: [facility] INVASIVE CV LAB;  Service: Cardiovascular;  Laterality: Left;   AV FISTULA PLACEMENT Left 11/11/2020   Procedure: LEFT ARM First Stage Basilic Vein Fistula Creation;  Surgeon: Sheree Penne Lonni, MD;  Location: [facility] OR;  Service: Vascular;  Laterality: Left;   BASCILIC VEIN TRANSPOSITION Left 01/23/2021   Procedure: Second Stage Basilic Vein Transposition of Left Arm Arteriovenous  Fistula;  Surgeon: Sheree Penne Lonni, MD;  Location: [facility] OR;  Service: Vascular;  Laterality: Left;  Block  to LMA    COLONOSCOPY     FISTULA  SUPERFICIALIZATION Left 05/07/2022   Procedure: PLICATION OF ANEURYSM, LEFT ARM FISTULA;  Surgeon: Eliza Lonni RAMAN, MD;  Location: [facility] OR;  Service: Vascular;  Laterality: Left;   NO PAST SURGERIES     PERIPHERAL VASCULAR BALLOON ANGIOPLASTY Left 05/03/2022   Procedure: PERIPHERAL VASCULAR BALLOON ANGIOPLASTY;  Surgeon: Eliza Lonni RAMAN, MD;  Location: [facility] INVASIVE CV LAB;  Service: Cardiovascular;  Laterality: Left;  AVF    Family History  Problem Relation Age of Onset   Hypertension Mother    Diabetes Father    Hypertension Father    Hypertension Sister    Hypertension Paternal Grandmother    Diabetes Mellitus II Paternal Grandfather    Heart disease Cousin    Heart disease Cousin    Colon cancer Cousin    Esophageal cancer Neg Hx    Rectal cancer Neg Hx    Stomach cancer Neg Hx     Social History   Socioeconomic History   Marital status: Single    Spouse name: Not on file   Number of children: Not on file   Years of education: Not on file   Highest education level: Not on file  Occupational History   Not on file  Tobacco Use   Smoking status: Some Days    Current packs/day: 0.00    Types: Cigarettes    Last attempt to quit: 09/2015    Years since quitting:  8.0   Smokeless tobacco: Never   Tobacco comments:    occasionally  Vaping Use   Vaping status: Some Days  Substance and Sexual Activity   Alcohol use: Not Currently   Drug use: Not Currently   Sexual activity: Yes  Other Topics Concern   Not on file  Social History Narrative   Epworth Sleepiness Score: 6   Social Drivers of Health   Financial Resource Strain: Not on file  Food Insecurity: No Food Insecurity (03/26/2022)   Hunger Vital Sign    Worried About Running Out of Food in the Last Year: Never true    Ran Out of Food in the Last Year: Never true  Transportation Needs: No Transportation Needs (03/26/2022)   PRAPARE - Administrator, Civil Service (Medical): No    Lack of  Transportation (Non-Medical): No  Physical Activity: Not on file  Stress: Not on file  Social Connections: Not on file  Intimate Partner Violence: Not At Risk (03/26/2022)   Humiliation, Afraid, Rape, and Kick questionnaire    Fear of Current or Ex-Partner: No    Emotionally Abused: No    Physically Abused: No    Sexually Abused: No     Physical Exam   Vitals:   09/15/23 0027 09/15/23 0030  BP: (!) 169/110   Pulse: 84 85  Resp: 20   Temp:    SpO2: 91% 94%    CONSTITUTIONAL: Well-appearing, NAD NEURO/PSYCH:  Alert and oriented x 3, no focal deficits EYES:  eyes equal and reactive ENT/NECK:  no LAD, no JVD CARDIO: Regular rate, well-perfused, normal S1 and S2 PULM:  CTAB no wheezing or rhonchi GI/GU:  non-distended, non-tender MSK/SPINE:  No gross deformities, no edema SKIN:  no rash, atraumatic   *Additional and/or pertinent findings included in MDM below  Diagnostic and Interventional Summary    EKG Interpretation Date/Time:    Ventricular Rate:    PR Interval:    QRS Duration:    QT Interval:    QTC Calculation:   R Axis:      Text Interpretation:         Labs Reviewed  RESP PANEL BY RT-PCR (RSV, FLU A&B, COVID)  [id number]    No orders to display    Medications - No data to display   Procedures  /  Critical Care Procedures  ED Course and Medical Decision Making  Initial Impression and Ddx Patient is extremely well-appearing and in no acute distress, sitting comfortably laying flat in the bed.  No increased work of breathing, lungs are clear.  Hypertension but otherwise reassuring vital signs.  Very minimal symptoms most suggestive of bronchitis.  Pneumonia also considered however chest x-ray earlier today without evidence of pneumonia.  Provider earlier today concerned about the read of pulmonary vascular congestion and cardiomegaly.  Overall I feel these findings are nonspecific and there is no evidence to suggest that they are emergent.  The cardiomegaly  is stable.  Pulmonary vascular congestion very commonly seen in the setting of patients with ESRD, CHF and this read alone does not suggest an exacerbation especially given patient's well-appearing nature.  Past medical/surgical history that increases complexity of ED encounter: ESRD, CHF  Interpretation of Diagnostics COVID flu and RSV negative  Patient Reassessment and Ultimate Disposition/Management     Discharge  Patient management required discussion with the following services or consulting groups:  None  Complexity of Problems Addressed Acute complicated illness or Injury  Additional Data Reviewed and Analyzed  Further history obtained from: Prior labs/imaging results  Additional Factors Impacting ED Encounter Risk None  Ozell HERO. Theadore, MD [facility] [email or web address]  Final Clinical Impressions(s) / ED Diagnoses     ICD-10-CM   1. Bronchitis  J40       ED Discharge Orders     None        Discharge Instructions Discussed with and Provided to Patient:    Discharge Instructions      You were evaluated in the Emergency Department and after careful evaluation, we did not find any emergent condition requiring admission or further testing in the hospital.  Your exam/testing today is overall reassuring.  Recommend going to your normal dialysis session today and monitoring your symptoms at home as we discussed.  Please return to the Emergency Department if you experience any worsening of your condition.   Thank you for allowing us  to be a part of your care.      Theadore Ozell HERO, MD 09/15/23 [phone]

## 2023-09-26 NOTE — Progress Notes (Signed)
 Email received from Debi, SW at patient's dialysis center, stating patient has an updated phone number.   New number: [phone]  Chart updated.

## 2023-09-28 ENCOUNTER — Ambulatory Visit
Admission: EM | Admit: 2023-09-28 | Discharge: 2023-09-28 | Disposition: A | Discharge status: Home / Self Care | Attending: Nurse Practitioner | Admitting: Nurse Practitioner

## 2023-09-28 DIAGNOSIS — K219 Gastro-esophageal reflux disease without esophagitis: Secondary | ICD-10-CM | POA: Diagnosis not present

## 2023-09-28 DIAGNOSIS — N186 End stage renal disease: Secondary | ICD-10-CM | POA: Diagnosis not present

## 2023-09-28 DIAGNOSIS — Z992 Dependence on renal dialysis: Secondary | ICD-10-CM

## 2023-09-28 DIAGNOSIS — J069 Acute upper respiratory infection, unspecified: Secondary | ICD-10-CM

## 2023-09-28 MED ORDER — PROMETHAZINE-DM 6.25-15 MG/5ML PO SYRP: 10.0000 mL | ORAL_SOLUTION | Freq: Four times a day (QID) | ORAL | 0 refills | Status: DC | PRN

## 2023-09-28 MED ORDER — OMEPRAZOLE 20 MG PO CPDR: 20.0000 mg | DELAYED_RELEASE_CAPSULE | Freq: Every day | ORAL | 0 refills | Status: DC

## 2023-09-28 NOTE — Discharge Instructions (Addendum)
 You have been diagnosed with symptoms consistent with gastroesophageal reflux disease (GERD), which appears to be contributing to your persistent cough and nighttime choking episodes. You were prescribed omeprazole  to take daily, preferably after dialysis on dialysis days, to help reduce stomach acid and control reflux symptoms. You were also prescribed promethazine  DM to help suppress your cough. To manage your symptoms at home, avoid lying down immediately after eating and try to remain upright for at least 2-3 hours after meals. Elevate the head of your bed if possible to prevent nighttime reflux. Avoid trigger foods such as spicy, acidic, or greasy meals, as well as caffeine and late-night snacking. Monitor your oxygen levels closely, especially during and after dialysis sessions. Seek emergency care if you experience worsening shortness of breath, persistent chest pain, an inability to keep food or fluids down, or a significant drop in oxygen saturation. Follow up with your primary care provider or nephrologist for evaluation and referral for an upper endoscopy to further investigate your reflux symptoms and cough.

## 2023-09-28 NOTE — ED Triage Notes (Signed)
 Pt present with chest congestion and cough x two weeks. Pt states he chest discomfort when coughing. States when he eats he feels the food is not digesting as it should, he feels it is stuck in his chest.

## 2023-09-28 NOTE — ED Provider Notes (Signed)
 [facility]    CSN: [id number] Arrival date & time: 09/28/23  1753      History   Chief Complaint Chief Complaint  Patient presents with   Cough    HPI Nathan Campbell is a 48 y.o. male.   Discussed the use of AI scribe software for clinical note transcription with the patient, who gave verbal consent to proceed.   Patient presents with complaints of cough, temporary chest pain, and acid reflux symptoms. The patient reports a longstanding history of these issues.The patient describes experiencing a cough without productive sputum. He notes temporary chest pain associated with the cough and when eating. He also reports acid reflux symptoms, which he believes may be related to his digestive process. He experiences occasional nighttime awakening due to coughing or choking sensations. The patient acknowledges that his symptoms worsen when he eats and then lies down shortly after. Associated symptoms include shortness of breath and wheezing, which the patient reports as consistent with his baseline status as a dialysis patient. 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 The patient denies fever, chills, body aches, nausea, vomiting, or diarrhea. He also denies palpitations or feeling like his heart is racing. Patient is on a regular dialysis schedule, with his next session scheduled for tomorrow. He reports his usual oxygen saturation as 100%, though during this visit, his levels were noted to be lower, fluctuating between 91-94%. Patient hasn't tried anything for his symptoms.   The following portions of the patient's history were reviewed and updated as appropriate: allergies, current medications, past family history, past medical history, past social history, past surgical history, and problem list.    Past Medical History:  Diagnosis Date   Acute combined systolic and diastolic CHF, NYHA  class 4 (HCC) 06/10/2016   Anemia    Cardiomyopathy (HCC) 06/14/2016   CHF (congestive heart failure) (HCC)    COVID 10/2020   Dyspnea    ESRD on hemodialysis (HCC)    TTS at [facility]   Hypertension    Hypertensive heart and kidney disease with heart failure (HCC) 06/14/2016   Hypertensive heart disease with congestive heart failure (HCC)    Obesity     Patient Active Problem List   Diagnosis Date Noted   Cellulitis and abscess of hand 03/26/2022   ESRD (end stage renal disease) (HCC) 03/26/2022   Hand abscess 03/26/2022   Change in bowel habits 08/13/2020   Constipation 08/13/2020   Acute bilateral low back pain without sciatica 07/04/2019   Acute gout due to renal impairment involving left wrist 04/24/2019   Malignant hypertension 10/14/2016   Hypertensive heart disease with CHF (congestive heart failure) (HCC) 06/14/2016   NICM (nonischemic cardiomyopathy) (HCC) 06/14/2016   Renal failure    Hypertensive urgency 06/10/2016   CKD (chronic kidney disease), stage IV (HCC) 06/10/2016   Normocytic anemia 06/10/2016    Past Surgical History:  Procedure Laterality Date   A/V FISTULAGRAM Left 05/03/2022   Procedure: A/V Fistulagram;  Surgeon: Eliza Lonni RAMAN, MD;  Location: [facility] INVASIVE CV LAB;  Service: Cardiovascular;  Laterality: Left;   AV FISTULA PLACEMENT Left 11/11/2020   Procedure: LEFT ARM First Stage Basilic Vein Fistula Creation;  Surgeon: Sheree Penne Lonni, MD;  Location: [facility] OR;  Service: Vascular;  Laterality: Left;   BASCILIC VEIN TRANSPOSITION Left 01/23/2021   Procedure: Second Stage Basilic Vein Transposition of Left Arm Arteriovenous  Fistula;  Surgeon: Sheree Penne  Lonni, MD;  Location: [facility] OR;  Service: Vascular;  Laterality: Left;  Block  to LMA    COLONOSCOPY     FISTULA SUPERFICIALIZATION Left 05/07/2022   Procedure: PLICATION OF ANEURYSM, LEFT ARM FISTULA;  Surgeon: Eliza Lonni RAMAN, MD;  Location: [facility] OR;  Service: Vascular;   Laterality: Left;   NO PAST SURGERIES     PERIPHERAL VASCULAR BALLOON ANGIOPLASTY Left 05/03/2022   Procedure: PERIPHERAL VASCULAR BALLOON ANGIOPLASTY;  Surgeon: Eliza Lonni RAMAN, MD;  Location: [facility] INVASIVE CV LAB;  Service: Cardiovascular;  Laterality: Left;  AVF       Home Medications    Prior to Admission medications   Medication Sig Start Date End Date Taking? Authorizing Provider  omeprazole  (PRILOSEC) 20 MG capsule Take 1 capsule (20 mg total) by mouth daily. Take after dialysis on dialysis days 09/28/23  Yes Iola Lukes, FNP  promethazine -dextromethorphan (PROMETHAZINE -DM) 6.25-15 MG/5ML syrup Take 10 mLs by mouth every 6 (six) hours as needed for cough. 09/28/23  Yes Irva Loser, FNP  acetaminophen  (TYLENOL ) 325 MG tablet Take 2 tablets (650 mg total) by mouth every 6 (six) hours as needed for moderate pain. 08/22/22   Christopher Savannah, PA-C  allopurinol  (ZYLOPRIM ) 100 MG tablet Take 1 tablet (100 mg total) by mouth daily. Do not begin until pain in right knee is resolved. 09/13/21   Joesph Shaver Scales, PA-C  amLODipine  (NORVASC ) 10 MG tablet Take 1 tablet (10 mg total) by mouth daily. 06/16/16   Gabriel Noa, MD  AURYXIA  1 GM 210 MG(Fe) tablet Take 420-630 mg by mouth See admin instructions. Take 630 mg with each meal and 420 to 630 mg with each snack 03/18/22   [provider]  azithromycin  (ZITHROMAX ) 250 MG tablet Day 1: take 2 tablets. Day 2-5: Take 1 tablet daily. 02/06/23   Christopher Savannah, PA-C  B Complex-C-Zn-Folic Acid (DIALYVITE 800/ZINC PO) Take 1 tablet by mouth daily.    [provider]  benzonatate  (TESSALON ) 100 MG capsule Take 1 capsule (100 mg total) by mouth 3 (three) times daily as needed for cough. 08/22/22   Christopher Savannah, PA-C  carvedilol  (COREG ) 25 MG tablet Take 25 mg by mouth 2 (two) times daily. 12/13/21   [provider]  cephALEXin  (KEFLEX ) 500 MG capsule Take 1 capsule (500 mg total) by mouth every 12 (twelve) hours. 03/28/22    Singh, Prashant K, MD  guaiFENesin -dextromethorphan (ROBITUSSIN DM) 100-10 MG/5ML syrup Take 5 mLs by mouth 3 (three) times daily as needed for cough. 03/06/23   Patsey Lot, MD  isosorbide  mononitrate (ISMO ) 10 MG tablet Take 10 mg by mouth daily. 03/11/22   [provider]  predniSONE  (DELTASONE ) 20 MG tablet Take 2 tablets daily with breakfast. 02/06/23   Christopher Savannah, PA-C  torsemide  (DEMADEX ) 20 MG tablet Take 60 mg by mouth 2 (two) times daily. 10/08/20   [provider]    Family History Family History  Problem Relation Age of Onset   Hypertension Mother    Diabetes Father    Hypertension Father    Hypertension Sister    Hypertension Paternal Grandmother    Diabetes Mellitus II Paternal Grandfather    Heart disease Cousin    Heart disease Cousin    Colon cancer Cousin    Esophageal cancer Neg Hx    Rectal cancer Neg Hx    Stomach cancer Neg Hx     Social History Social History   Tobacco Use   Smoking status: Some Days    Current  packs/day: 0.00    Types: Cigarettes    Last attempt to quit: 09/2015    Years since quitting: 8.0   Smokeless tobacco: Never   Tobacco comments:    occasionally  Vaping Use   Vaping status: Some Days  Substance Use Topics   Alcohol use: Not Currently   Drug use: Not Currently     Allergies   Patient has no known allergies.   Review of Systems Review of Systems  Constitutional:  Negative for chills and fever.  HENT:  Positive for congestion and sneezing. Negative for rhinorrhea and sore throat.   Respiratory:  Positive for cough and shortness of breath. Negative for wheezing.   Cardiovascular:  Positive for chest pain. Negative for palpitations and leg swelling.  Gastrointestinal:  Negative for diarrhea, nausea and vomiting.  Musculoskeletal:  Negative for myalgias.  All other systems reviewed and are negative.    Physical Exam Triage Vital Signs ED Triage Vitals  Encounter Vitals Group     BP  09/28/23 1822 (!) 168/112     Girls Systolic BP Percentile --      Girls Diastolic BP Percentile --      Boys Systolic BP Percentile --      Boys Diastolic BP Percentile --      Pulse Rate 09/28/23 1820 97     Resp 09/28/23 1820 (!) 22     Temp 09/28/23 1822 98.9 F (37.2 C)     Temp Source 09/28/23 1822 Oral     SpO2 09/28/23 1820 91 %     Weight --      Height --      Head Circumference --      Peak Flow --      Pain Score 09/28/23 1820 0     Pain Loc --      Pain Education --      Exclude from Growth Chart --    No data found.  Updated Vital Signs BP (!) 168/112   Pulse 97   Temp 98.9 F (37.2 C) (Oral)   Resp (!) 22   SpO2 91%   Visual Acuity Right Eye Distance:   Left Eye Distance:   Bilateral Distance:    Right Eye Near:   Left Eye Near:    Bilateral Near:     Physical Exam Vitals reviewed.  Constitutional:      General: He is awake. He is not in acute distress.    Appearance: Normal appearance. He is well-developed. He is obese. He is not ill-appearing, toxic-appearing or diaphoretic.  HENT:     Head: Normocephalic.     Right Ear: Tympanic membrane, ear canal and external ear normal. No drainage, swelling or tenderness. No middle ear effusion. Tympanic membrane is not erythematous.     Left Ear: Tympanic membrane, ear canal and external ear normal. No drainage, swelling or tenderness.  No middle ear effusion. Tympanic membrane is not erythematous.     Nose: Congestion present. No rhinorrhea.     Mouth/Throat:     Lips: Pink.     Mouth: Mucous membranes are moist.     Pharynx: Oropharynx is clear. Uvula midline. No pharyngeal swelling, oropharyngeal exudate, posterior oropharyngeal erythema or uvula swelling.     Tonsils: No tonsillar exudate or tonsillar abscesses.  Eyes:     General: Vision grossly intact.     Conjunctiva/sclera: Conjunctivae normal.  Cardiovascular:     Rate and Rhythm: Normal rate.     Heart sounds: Normal  heart sounds.      Arteriovenous access: Left arteriovenous access is present.     Comments: Left AV fistula, (+) bruit & thrill  Pulmonary:     Effort: Pulmonary effort is normal. No tachypnea or respiratory distress.     Breath sounds: Normal breath sounds and air entry.  Abdominal:     Palpations: Abdomen is soft.  Musculoskeletal:        General: Normal range of motion.     Cervical back: Full passive range of motion without pain, normal range of motion and neck supple.     Right lower leg: No edema.     Left lower leg: No edema.  Lymphadenopathy:     Cervical: No cervical adenopathy.  Skin:    General: Skin is warm and dry.  Neurological:     General: No focal deficit present.     Mental Status: He is alert and oriented to person, place, and time.  Psychiatric:        Attention and Perception: Attention normal.        Mood and Affect: Mood and affect normal.        Speech: Speech normal.        Behavior: Behavior normal. Behavior is cooperative.      UC Treatments / Results  Labs (all labs ordered are listed, but only abnormal results are displayed) Labs Reviewed - No data to display  EKG   Radiology No results found.  Procedures Procedures (including critical care time)  Medications Ordered in UC Medications - No data to display  Initial Impression / Assessment and Plan / UC Course  I have reviewed the triage vital signs and the nursing notes.  Pertinent labs & imaging results that were available during my care of the patient were reviewed by me and considered in my medical decision making (see chart for details).    Patient presents with a persistent nonproductive cough and symptoms consistent with gastroesophageal reflux disease (GERD), including chest discomfort during eating, reflux, and nighttime coughing or choking episodes. He reports symptom exacerbation when lying down after meals. Lung examination is clear, and there is no evidence of lower respiratory infection or  acute pulmonary process. Oxygen saturation is fluctuating between 91-94%, which is lower than baseline but consistent with his recent readings. The patient is on dialysis and denies new or worsening shortness of breath compared to his usual baseline. No systemic symptoms such as fever, chills, nausea, or vomiting are present.  Based on history and presentation, GERD is likely contributing to the cough and nighttime symptoms. Omeprazole  was prescribed for daily use to manage acid reflux, with instructions to take the dose after dialysis on dialysis days. Promethazine  DM was prescribed to help suppress the cough. The patient was advised to remain upright after meals and avoid lying down immediately after eating to reduce reflux episodes. Referral for an upper endoscopy through his PCP or nephrologist was recommended to further evaluate esophageal involvement. Oxygen levels will need to be monitored, particularly during his next dialysis session. The patient was instructed to follow up with his primary care provider or nephrologist for continued care and evaluation. Emergency care is advised if symptoms worsen significantly, especially if he experiences increased shortness of breath, chest pain, or significant changes in oxygen saturation.  Today's evaluation has revealed no signs of a dangerous process. Discussed diagnosis with patient and/or guardian. Patient and/or guardian aware of their diagnosis, possible red flag symptoms to watch out for and need for  close follow up. Patient and/or guardian understands verbal and written discharge instructions. Patient and/or guardian comfortable with plan and disposition.  Patient and/or guardian has a clear mental status at this time, good insight into illness (after discussion and teaching) and has clear judgment to make decisions regarding their care  Documentation was completed with the aid of voice recognition software. Transcription may contain typographical  errors.  I do not  Final Clinical Impressions(s) / UC Diagnoses   Final diagnoses:  Gastroesophageal reflux disease, unspecified whether esophagitis present  URI with cough and congestion  ESRD on dialysis [facility]     Discharge Instructions      You have been diagnosed with symptoms consistent with gastroesophageal reflux disease (GERD), which appears to be contributing to your persistent cough and nighttime choking episodes. You were prescribed omeprazole  to take daily, preferably after dialysis on dialysis days, to help reduce stomach acid and control reflux symptoms. You were also prescribed promethazine  DM to help suppress your cough. To manage your symptoms at home, avoid lying down immediately after eating and try to remain upright for at least 2-3 hours after meals. Elevate the head of your bed if possible to prevent nighttime reflux. Avoid trigger foods such as spicy, acidic, or greasy meals, as well as caffeine and late-night snacking. Monitor your oxygen levels closely, especially during and after dialysis sessions. Seek emergency care if you experience worsening shortness of breath, persistent chest pain, an inability to keep food or fluids down, or a significant drop in oxygen saturation. Follow up with your primary care provider or nephrologist for evaluation and referral for an upper endoscopy to further investigate your reflux symptoms and cough.      ED Prescriptions     Medication Sig Dispense Auth. Provider   omeprazole  (PRILOSEC) 20 MG capsule Take 1 capsule (20 mg total) by mouth daily. Take after dialysis on dialysis days 30 capsule Iola Lukes, FNP   promethazine -dextromethorphan (PROMETHAZINE -DM) 6.25-15 MG/5ML syrup Take 10 mLs by mouth every 6 (six) hours as needed for cough. 118 mL Iola Lukes, FNP      PDMP not reviewed this encounter.   Iola Lukes, [place] 09/29/23 [phone]

## 2023-10-07 ENCOUNTER — Inpatient Hospital Stay (HOSPITAL_COMMUNITY)

## 2023-10-07 ENCOUNTER — Emergency Department (HOSPITAL_COMMUNITY): Admitting: Certified Registered Nurse Anesthetist

## 2023-10-07 ENCOUNTER — Emergency Department (HOSPITAL_COMMUNITY)

## 2023-10-07 ENCOUNTER — Encounter (HOSPITAL_COMMUNITY)
Admission: EM | Disposition: A | Discharge status: Rehabilitation Facility | Payer: Self-pay | Source: Home / Self Care | Attending: Neurology

## 2023-10-07 ENCOUNTER — Other Ambulatory Visit: Payer: Self-pay

## 2023-10-07 ENCOUNTER — Inpatient Hospital Stay (HOSPITAL_COMMUNITY)
Admission: EM | Admit: 2023-10-07 | Discharge: 2023-10-18 | DRG: 023 | Disposition: A | Discharge status: Rehabilitation Facility | Attending: Neurology | Admitting: Neurology

## 2023-10-07 ENCOUNTER — Encounter (HOSPITAL_COMMUNITY): Payer: Self-pay

## 2023-10-07 DIAGNOSIS — I10 Essential (primary) hypertension: Secondary | ICD-10-CM | POA: Insufficient documentation

## 2023-10-07 DIAGNOSIS — I6389 Other cerebral infarction: Secondary | ICD-10-CM | POA: Diagnosis not present

## 2023-10-07 DIAGNOSIS — I132 Hypertensive heart and chronic kidney disease with heart failure and with stage 5 chronic kidney disease, or end stage renal disease: Secondary | ICD-10-CM | POA: Diagnosis not present

## 2023-10-07 DIAGNOSIS — R1312 Dysphagia, oropharyngeal phase: Secondary | ICD-10-CM | POA: Diagnosis not present

## 2023-10-07 DIAGNOSIS — J9601 Acute respiratory failure with hypoxia: Secondary | ICD-10-CM | POA: Insufficient documentation

## 2023-10-07 DIAGNOSIS — N2581 Secondary hyperparathyroidism of renal origin: Secondary | ICD-10-CM | POA: Diagnosis present

## 2023-10-07 DIAGNOSIS — R451 Restlessness and agitation: Secondary | ICD-10-CM | POA: Diagnosis not present

## 2023-10-07 DIAGNOSIS — I639 Cerebral infarction, unspecified: Principal | ICD-10-CM | POA: Diagnosis present

## 2023-10-07 DIAGNOSIS — Z8249 Family history of ischemic heart disease and other diseases of the circulatory system: Secondary | ICD-10-CM

## 2023-10-07 DIAGNOSIS — I63411 Cerebral infarction due to embolism of right middle cerebral artery: Principal | ICD-10-CM | POA: Diagnosis present

## 2023-10-07 DIAGNOSIS — M549 Dorsalgia, unspecified: Secondary | ICD-10-CM | POA: Diagnosis present

## 2023-10-07 DIAGNOSIS — Z992 Dependence on renal dialysis: Secondary | ICD-10-CM | POA: Diagnosis not present

## 2023-10-07 DIAGNOSIS — G936 Cerebral edema: Secondary | ICD-10-CM | POA: Diagnosis not present

## 2023-10-07 DIAGNOSIS — Z79899 Other long term (current) drug therapy: Secondary | ICD-10-CM

## 2023-10-07 DIAGNOSIS — E669 Obesity, unspecified: Secondary | ICD-10-CM | POA: Diagnosis not present

## 2023-10-07 DIAGNOSIS — G8194 Hemiplegia, unspecified affecting left nondominant side: Secondary | ICD-10-CM | POA: Diagnosis present

## 2023-10-07 DIAGNOSIS — E66812 Obesity, class 2: Secondary | ICD-10-CM | POA: Diagnosis present

## 2023-10-07 DIAGNOSIS — J95851 Ventilator associated pneumonia: Secondary | ICD-10-CM | POA: Diagnosis not present

## 2023-10-07 DIAGNOSIS — D72829 Elevated white blood cell count, unspecified: Secondary | ICD-10-CM | POA: Insufficient documentation

## 2023-10-07 DIAGNOSIS — J690 Pneumonitis due to inhalation of food and vomit: Secondary | ICD-10-CM | POA: Diagnosis not present

## 2023-10-07 DIAGNOSIS — N186 End stage renal disease: Secondary | ICD-10-CM | POA: Diagnosis present

## 2023-10-07 DIAGNOSIS — I120 Hypertensive chronic kidney disease with stage 5 chronic kidney disease or end stage renal disease: Secondary | ICD-10-CM | POA: Diagnosis not present

## 2023-10-07 DIAGNOSIS — I63311 Cerebral infarction due to thrombosis of right middle cerebral artery: Secondary | ICD-10-CM

## 2023-10-07 DIAGNOSIS — R270 Ataxia, unspecified: Secondary | ICD-10-CM | POA: Diagnosis present

## 2023-10-07 DIAGNOSIS — I69354 Hemiplegia and hemiparesis following cerebral infarction affecting left non-dominant side: Secondary | ICD-10-CM | POA: Diagnosis not present

## 2023-10-07 DIAGNOSIS — W44F9XA Other object of natural or organic material, entering into or through a natural orifice, initial encounter: Secondary | ICD-10-CM | POA: Diagnosis not present

## 2023-10-07 DIAGNOSIS — R29713 NIHSS score 13: Secondary | ICD-10-CM | POA: Diagnosis present

## 2023-10-07 DIAGNOSIS — E871 Hypo-osmolality and hyponatremia: Secondary | ICD-10-CM | POA: Diagnosis not present

## 2023-10-07 DIAGNOSIS — I1311 Hypertensive heart and chronic kidney disease without heart failure, with stage 5 chronic kidney disease, or end stage renal disease: Secondary | ICD-10-CM | POA: Diagnosis not present

## 2023-10-07 DIAGNOSIS — I272 Pulmonary hypertension, unspecified: Secondary | ICD-10-CM | POA: Diagnosis present

## 2023-10-07 DIAGNOSIS — I63512 Cerebral infarction due to unspecified occlusion or stenosis of left middle cerebral artery: Secondary | ICD-10-CM | POA: Diagnosis not present

## 2023-10-07 DIAGNOSIS — R29810 Facial weakness: Secondary | ICD-10-CM | POA: Diagnosis present

## 2023-10-07 DIAGNOSIS — T17918A Gastric contents in respiratory tract, part unspecified causing other injury, initial encounter: Secondary | ICD-10-CM | POA: Diagnosis not present

## 2023-10-07 DIAGNOSIS — I5042 Chronic combined systolic (congestive) and diastolic (congestive) heart failure: Secondary | ICD-10-CM | POA: Diagnosis present

## 2023-10-07 DIAGNOSIS — I63511 Cerebral infarction due to unspecified occlusion or stenosis of right middle cerebral artery: Secondary | ICD-10-CM | POA: Diagnosis not present

## 2023-10-07 DIAGNOSIS — I5022 Chronic systolic (congestive) heart failure: Secondary | ICD-10-CM | POA: Diagnosis not present

## 2023-10-07 DIAGNOSIS — Z515 Encounter for palliative care: Secondary | ICD-10-CM

## 2023-10-07 DIAGNOSIS — R414 Neurologic neglect syndrome: Secondary | ICD-10-CM | POA: Diagnosis present

## 2023-10-07 DIAGNOSIS — E781 Pure hyperglyceridemia: Secondary | ICD-10-CM | POA: Diagnosis present

## 2023-10-07 DIAGNOSIS — M79602 Pain in left arm: Secondary | ICD-10-CM | POA: Diagnosis present

## 2023-10-07 DIAGNOSIS — D631 Anemia in chronic kidney disease: Secondary | ICD-10-CM | POA: Diagnosis present

## 2023-10-07 DIAGNOSIS — F17210 Nicotine dependence, cigarettes, uncomplicated: Secondary | ICD-10-CM | POA: Diagnosis present

## 2023-10-07 DIAGNOSIS — R1311 Dysphagia, oral phase: Secondary | ICD-10-CM | POA: Diagnosis present

## 2023-10-07 DIAGNOSIS — E1122 Type 2 diabetes mellitus with diabetic chronic kidney disease: Secondary | ICD-10-CM | POA: Diagnosis not present

## 2023-10-07 DIAGNOSIS — I6601 Occlusion and stenosis of right middle cerebral artery: Secondary | ICD-10-CM | POA: Diagnosis not present

## 2023-10-07 DIAGNOSIS — Z6839 Body mass index (BMI) 39.0-39.9, adult: Secondary | ICD-10-CM

## 2023-10-07 DIAGNOSIS — I951 Orthostatic hypotension: Secondary | ICD-10-CM | POA: Diagnosis not present

## 2023-10-07 DIAGNOSIS — F09 Unspecified mental disorder due to known physiological condition: Secondary | ICD-10-CM | POA: Diagnosis not present

## 2023-10-07 DIAGNOSIS — I779 Disorder of arteries and arterioles, unspecified: Secondary | ICD-10-CM | POA: Diagnosis not present

## 2023-10-07 DIAGNOSIS — F17200 Nicotine dependence, unspecified, uncomplicated: Secondary | ICD-10-CM

## 2023-10-07 DIAGNOSIS — I429 Cardiomyopathy, unspecified: Secondary | ICD-10-CM | POA: Diagnosis present

## 2023-10-07 DIAGNOSIS — Z8616 Personal history of COVID-19: Secondary | ICD-10-CM | POA: Diagnosis not present

## 2023-10-07 DIAGNOSIS — R1310 Dysphagia, unspecified: Secondary | ICD-10-CM | POA: Diagnosis not present

## 2023-10-07 DIAGNOSIS — Z833 Family history of diabetes mellitus: Secondary | ICD-10-CM

## 2023-10-07 DIAGNOSIS — Y848 Other medical procedures as the cause of abnormal reaction of the patient, or of later complication, without mention of misadventure at the time of the procedure: Secondary | ICD-10-CM | POA: Diagnosis not present

## 2023-10-07 DIAGNOSIS — Z716 Tobacco abuse counseling: Secondary | ICD-10-CM

## 2023-10-07 DIAGNOSIS — E875 Hyperkalemia: Secondary | ICD-10-CM | POA: Diagnosis present

## 2023-10-07 DIAGNOSIS — F17290 Nicotine dependence, other tobacco product, uncomplicated: Secondary | ICD-10-CM | POA: Diagnosis present

## 2023-10-07 DIAGNOSIS — I69392 Facial weakness following cerebral infarction: Secondary | ICD-10-CM | POA: Diagnosis not present

## 2023-10-07 DIAGNOSIS — G8311 Monoplegia of lower limb affecting right dominant side: Secondary | ICD-10-CM | POA: Diagnosis present

## 2023-10-07 DIAGNOSIS — R5381 Other malaise: Secondary | ICD-10-CM | POA: Diagnosis present

## 2023-10-07 DIAGNOSIS — I5040 Unspecified combined systolic (congestive) and diastolic (congestive) heart failure: Secondary | ICD-10-CM | POA: Diagnosis present

## 2023-10-07 DIAGNOSIS — R471 Dysarthria and anarthria: Secondary | ICD-10-CM | POA: Diagnosis present

## 2023-10-07 DIAGNOSIS — E785 Hyperlipidemia, unspecified: Secondary | ICD-10-CM | POA: Diagnosis not present

## 2023-10-07 DIAGNOSIS — G4730 Sleep apnea, unspecified: Secondary | ICD-10-CM | POA: Diagnosis present

## 2023-10-07 DIAGNOSIS — L299 Pruritus, unspecified: Secondary | ICD-10-CM | POA: Diagnosis not present

## 2023-10-07 DIAGNOSIS — Z1152 Encounter for screening for COVID-19: Secondary | ICD-10-CM

## 2023-10-07 DIAGNOSIS — I5043 Acute on chronic combined systolic (congestive) and diastolic (congestive) heart failure: Secondary | ICD-10-CM | POA: Diagnosis not present

## 2023-10-07 DIAGNOSIS — N184 Chronic kidney disease, stage 4 (severe): Secondary | ICD-10-CM | POA: Diagnosis present

## 2023-10-07 DIAGNOSIS — R130 Aphagia: Secondary | ICD-10-CM | POA: Diagnosis not present

## 2023-10-07 DIAGNOSIS — K567 Ileus, unspecified: Secondary | ICD-10-CM | POA: Diagnosis not present

## 2023-10-07 DIAGNOSIS — I69391 Dysphagia following cerebral infarction: Secondary | ICD-10-CM | POA: Diagnosis not present

## 2023-10-07 DIAGNOSIS — Z800 Family history of malignant neoplasm of digestive organs: Secondary | ICD-10-CM

## 2023-10-07 DIAGNOSIS — I480 Paroxysmal atrial fibrillation: Secondary | ICD-10-CM | POA: Diagnosis present

## 2023-10-07 DIAGNOSIS — I428 Other cardiomyopathies: Secondary | ICD-10-CM

## 2023-10-07 DIAGNOSIS — J9602 Acute respiratory failure with hypercapnia: Secondary | ICD-10-CM | POA: Diagnosis not present

## 2023-10-07 DIAGNOSIS — I161 Hypertensive emergency: Secondary | ICD-10-CM | POA: Diagnosis present

## 2023-10-07 DIAGNOSIS — I959 Hypotension, unspecified: Secondary | ICD-10-CM | POA: Diagnosis not present

## 2023-10-07 DIAGNOSIS — Z7982 Long term (current) use of aspirin: Secondary | ICD-10-CM

## 2023-10-07 DIAGNOSIS — H518 Other specified disorders of binocular movement: Secondary | ICD-10-CM | POA: Diagnosis present

## 2023-10-07 HISTORY — PX: IR PATIENT EVAL TECH 0-60 MINS: IMG5564

## 2023-10-07 HISTORY — PX: RADIOLOGY WITH ANESTHESIA: SHX6223

## 2023-10-07 HISTORY — PX: IR PERCUTANEOUS ART THROMBECTOMY/INFUSION INTRACRANIAL INC DIAG ANGIO: IMG6087

## 2023-10-07 HISTORY — PX: IR CT HEAD LTD: IMG2386

## 2023-10-07 LAB — DIFFERENTIAL
Abs Immature Granulocytes: 0.04 K/uL (ref 0.00–0.07)
Basophils Absolute: 0 K/uL (ref 0.0–0.1)
Basophils Relative: 0 %
Eosinophils Absolute: 0.2 K/uL (ref 0.0–0.5)
Eosinophils Relative: 2 %
Immature Granulocytes: 1 %
Lymphocytes Relative: 20 %
Lymphs Abs: 1.6 K/uL (ref 0.7–4.0)
Monocytes Absolute: 1 K/uL (ref 0.1–1.0)
Monocytes Relative: 13 %
Neutro Abs: 5.1 K/uL (ref 1.7–7.7)
Neutrophils Relative %: 64 %

## 2023-10-07 LAB — COMPREHENSIVE METABOLIC PANEL WITH GFR
ALT: 13 U/L (ref 0–44)
AST: 15 U/L (ref 15–41)
Albumin: 3.7 g/dL (ref 3.5–5.0)
Alkaline Phosphatase: 82 U/L (ref 38–126)
Anion gap: 18 — ABNORMAL HIGH (ref 5–15)
BUN: 62 mg/dL — ABNORMAL HIGH (ref 6–20)
CO2: 24 mmol/L (ref 22–32)
Calcium: 9.8 mg/dL (ref 8.9–10.3)
Chloride: 98 mmol/L (ref 98–111)
Creatinine, Ser: 10.84 mg/dL — ABNORMAL HIGH (ref 0.61–1.24)
GFR, Estimated: 5 mL/min — ABNORMAL LOW (ref >60)
Glucose, Bld: 152 mg/dL — ABNORMAL HIGH (ref 70–99)
Potassium: 4.3 mmol/L (ref 3.5–5.1)
Sodium: 140 mmol/L (ref 135–145)
Total Bilirubin: 0.6 mg/dL (ref 0.0–1.2)
Total Protein: 7.2 g/dL (ref 6.5–8.1)

## 2023-10-07 LAB — CBC
HCT: 34.7 % — ABNORMAL LOW (ref 39.0–52.0)
Hemoglobin: 11.2 g/dL — ABNORMAL LOW (ref 13.0–17.0)
MCH: 32.1 pg (ref 26.0–34.0)
MCHC: 32.3 g/dL (ref 30.0–36.0)
MCV: 99.4 fL (ref 80.0–100.0)
Platelets: 229 K/uL (ref 150–400)
RBC: 3.49 MIL/uL — ABNORMAL LOW (ref 4.22–5.81)
RDW: 12.8 % (ref 11.5–15.5)
WBC: 7.9 K/uL (ref 4.0–10.5)
nRBC: 0 % (ref 0.0–0.2)

## 2023-10-07 LAB — RAPID URINE DRUG SCREEN, HOSP PERFORMED
Amphetamines: NOT DETECTED
Barbiturates: NOT DETECTED
Benzodiazepines: NOT DETECTED
Cocaine: NOT DETECTED
Opiates: NOT DETECTED
Tetrahydrocannabinol: NOT DETECTED

## 2023-10-07 LAB — POCT I-STAT 7, (LYTES, BLD GAS, ICA,H+H)
Acid-Base Excess: 1 mmol/L (ref 0.0–2.0)
Bicarbonate: 25 mmol/L (ref 20.0–28.0)
Calcium, Ion: 1.17 mmol/L (ref 1.15–1.40)
HCT: 28 % — ABNORMAL LOW (ref 39.0–52.0)
Hemoglobin: 9.5 g/dL — ABNORMAL LOW (ref 13.0–17.0)
O2 Saturation: 94 %
Patient temperature: 37
Potassium: 4.3 mmol/L (ref 3.5–5.1)
Sodium: 139 mmol/L (ref 135–145)
TCO2: 26 mmol/L (ref 22–32)
pCO2 arterial: 38.2 mmHg (ref 32–48)
pH, Arterial: 7.424 (ref 7.35–7.45)
pO2, Arterial: 70 mmHg — ABNORMAL LOW (ref 83–108)

## 2023-10-07 LAB — I-STAT CHEM 8, ED
BUN: 57 mg/dL — ABNORMAL HIGH (ref 6–20)
Calcium, Ion: 1.11 mmol/L — ABNORMAL LOW (ref 1.15–1.40)
Chloride: 101 mmol/L (ref 98–111)
Creatinine, Ser: 12 mg/dL — ABNORMAL HIGH (ref 0.61–1.24)
Glucose, Bld: 153 mg/dL — ABNORMAL HIGH (ref 70–99)
HCT: 34 % — ABNORMAL LOW (ref 39.0–52.0)
Hemoglobin: 11.6 g/dL — ABNORMAL LOW (ref 13.0–17.0)
Potassium: 4.3 mmol/L (ref 3.5–5.1)
Sodium: 140 mmol/L (ref 135–145)
TCO2: 25 mmol/L (ref 22–32)

## 2023-10-07 LAB — ECHOCARDIOGRAM COMPLETE
Area-P 1/2: 3.12 cm2
S' Lateral: 3.1 cm
Weight: 5079.4 [oz_av]

## 2023-10-07 LAB — GLUCOSE, CAPILLARY
Glucose-Capillary: 81 mg/dL (ref 70–99)
Glucose-Capillary: 69 mg/dL — ABNORMAL LOW (ref 70–99)
Glucose-Capillary: 85 mg/dL (ref 70–99)
Glucose-Capillary: 80 mg/dL (ref 70–99)

## 2023-10-07 LAB — MRSA NEXT GEN BY PCR, NASAL: MRSA by PCR Next Gen: NOT DETECTED

## 2023-10-07 LAB — PROTIME-INR
INR: 1 (ref 0.8–1.2)
Prothrombin Time: 14 s (ref 11.4–15.2)

## 2023-10-07 LAB — APTT: aPTT: 31 s (ref 24–36)

## 2023-10-07 LAB — ETHANOL: Alcohol, Ethyl (B): <15 mg/dL

## 2023-10-07 LAB — SARS CORONAVIRUS 2 BY RT PCR: SARS Coronavirus 2 by RT PCR: NEGATIVE

## 2023-10-07 LAB — HEPATITIS B SURFACE ANTIGEN: Hepatitis B Surface Ag: NONREACTIVE

## 2023-10-07 LAB — TROPONIN I (HIGH SENSITIVITY): Troponin I (High Sensitivity): 52 ng/L — ABNORMAL HIGH (ref <18)

## 2023-10-07 LAB — CBG MONITORING, ED: Glucose-Capillary: 145 mg/dL — ABNORMAL HIGH (ref 70–99)

## 2023-10-07 SURGERY — RADIOLOGY WITH ANESTHESIA
Anesthesia: General

## 2023-10-07 MED ORDER — ORAL CARE MOUTH RINSE: 15.0000 mL | OROMUCOSAL | Status: DC | PRN

## 2023-10-07 MED ORDER — SENNOSIDES-DOCUSATE SODIUM 8.6-50 MG PO TABS: 1.0000 | ORAL_TABLET | Freq: Every evening | ORAL | Status: DC | PRN

## 2023-10-07 MED ORDER — ACETAMINOPHEN 650 MG RE SUPP: 650.0000 mg | RECTAL | Status: DC | PRN

## 2023-10-07 MED ORDER — ACETAMINOPHEN 325 MG PO TABS: 650.0000 mg | ORAL_TABLET | ORAL | Status: DC | PRN

## 2023-10-07 MED ORDER — LABETALOL HCL 5 MG/ML IV SOLN
INTRAVENOUS | Status: AC
  Filled 2023-10-07: qty 4

## 2023-10-07 MED ORDER — MIDAZOLAM HCL 2 MG/2ML IJ SOLN
INTRAMUSCULAR | Status: AC
Start: 2023-10-07 — End: 2023-10-07
  Filled 2023-10-07: qty 2

## 2023-10-07 MED ORDER — FENTANYL CITRATE (PF) 100 MCG/2ML IJ SOLN
INTRAMUSCULAR | Status: DC | PRN
  Administered 2023-10-07: 100 ug via INTRAVENOUS

## 2023-10-07 MED ORDER — IOHEXOL 350 MG/ML SOLN
75.0000 mL | Freq: Once | INTRAVENOUS | Status: AC | PRN
  Administered 2023-10-07: 100 mL via INTRAVENOUS

## 2023-10-07 MED ORDER — LABETALOL HCL 5 MG/ML IV SOLN: 20.0000 mg | Freq: Once | INTRAVENOUS | Status: DC

## 2023-10-07 MED ORDER — FENTANYL CITRATE (PF) 100 MCG/2ML IJ SOLN
INTRAMUSCULAR | Status: AC
  Filled 2023-10-07: qty 2

## 2023-10-07 MED ORDER — ACETAMINOPHEN 160 MG/5ML PO SOLN
650.0000 mg | ORAL | Status: DC | PRN
  Administered 2023-10-08 – 2023-10-18 (×9): 650 mg
  Filled 2023-10-07 (×9): qty 20.3

## 2023-10-07 MED ORDER — ASPIRIN 325 MG PO TABS
ORAL_TABLET | ORAL | Status: AC | PRN
  Administered 2023-10-07: 81 mg

## 2023-10-07 MED ORDER — PANTOPRAZOLE SODIUM 40 MG IV SOLR
40.0000 mg | Freq: Every day | INTRAVENOUS | Status: DC
  Administered 2023-10-07 – 2023-10-17 (×11): 40 mg via INTRAVENOUS
  Filled 2023-10-07 (×11): qty 10

## 2023-10-07 MED ORDER — NITROGLYCERIN 1 MG/10 ML FOR IR/CATH LAB
INTRA_ARTERIAL | Status: AC
  Filled 2023-10-07: qty 10

## 2023-10-07 MED ORDER — SUCCINYLCHOLINE CHLORIDE 20 MG/ML IJ SOLN
INTRAMUSCULAR | Status: DC | PRN
  Administered 2023-10-07: 150 mg via INTRAVENOUS

## 2023-10-07 MED ORDER — SODIUM CHLORIDE 0.9 % IV SOLN: INTRAVENOUS | Status: DC

## 2023-10-07 MED ORDER — TICAGRELOR 90 MG PO TABS
ORAL_TABLET | ORAL | Status: AC
  Filled 2023-10-07: qty 1

## 2023-10-07 MED ORDER — CHLORHEXIDINE GLUCONATE CLOTH 2 % EX PADS
6.0000 | MEDICATED_PAD | Freq: Every day | CUTANEOUS | Status: DC
  Administered 2023-10-08 – 2023-10-09 (×2): 6 via TOPICAL

## 2023-10-07 MED ORDER — MIDAZOLAM HCL 2 MG/2ML IJ SOLN
INTRAMUSCULAR | Status: AC
  Filled 2023-10-07: qty 2

## 2023-10-07 MED ORDER — FENTANYL 2500MCG IN NS 250ML (10MCG/ML) PREMIX INFUSION
0.0000 ug/h | INTRAVENOUS | Status: DC
  Administered 2023-10-07: 50 ug/h via INTRAVENOUS
  Administered 2023-10-07: 100 ug/h via INTRAVENOUS
  Administered 2023-10-08: 50 ug/h via INTRAVENOUS
  Filled 2023-10-07 (×3): qty 250

## 2023-10-07 MED ORDER — ACETAMINOPHEN 160 MG/5ML PO SOLN: 650.0000 mg | ORAL | Status: DC | PRN

## 2023-10-07 MED ORDER — SUCCINYLCHOLINE CHLORIDE 200 MG/10ML IV SOSY
PREFILLED_SYRINGE | INTRAVENOUS | Status: AC
  Filled 2023-10-07: qty 10

## 2023-10-07 MED ORDER — NOREPINEPHRINE 4 MG/250ML-% IV SOLN
0.0000 ug/min | INTRAVENOUS | Status: DC
  Administered 2023-10-08: 2 ug/min via INTRAVENOUS
  Filled 2023-10-07: qty 250

## 2023-10-07 MED ORDER — ROCURONIUM BROMIDE 10 MG/ML (PF) SYRINGE
PREFILLED_SYRINGE | INTRAVENOUS | Status: DC
Start: 2023-10-07 — End: 2023-10-07
  Filled 2023-10-07: qty 10

## 2023-10-07 MED ORDER — CHLORHEXIDINE GLUCONATE CLOTH 2 % EX PADS
6.0000 | MEDICATED_PAD | Freq: Every day | CUTANEOUS | Status: DC
  Administered 2023-10-07 – 2023-10-10 (×4): 6 via TOPICAL

## 2023-10-07 MED ORDER — PROPOFOL 1000 MG/100ML IV EMUL
0.0000 ug/kg/min | INTRAVENOUS | Status: DC
  Administered 2023-10-07: 20 ug/kg/min via INTRAVENOUS
  Administered 2023-10-07: 50 ug/kg/min via INTRAVENOUS
  Administered 2023-10-07: 30 ug/kg/min via INTRAVENOUS
  Administered 2023-10-07: 5 ug/kg/min via INTRAVENOUS
  Administered 2023-10-08 (×3): 15 ug/kg/min via INTRAVENOUS
  Filled 2023-10-07 (×7): qty 100

## 2023-10-07 MED ORDER — ROCURONIUM BROMIDE 10 MG/ML (PF) SYRINGE
PREFILLED_SYRINGE | INTRAVENOUS | Status: DC | PRN
  Administered 2023-10-07 (×2): 100 mg via INTRAVENOUS

## 2023-10-07 MED ORDER — CLEVIDIPINE BUTYRATE 0.5 MG/ML IV EMUL: 0.0000 mg/h | INTRAVENOUS | Status: DC

## 2023-10-07 MED ORDER — ALBUTEROL SULFATE HFA 108 (90 BASE) MCG/ACT IN AERS
INHALATION_SPRAY | RESPIRATORY_TRACT | Status: DC | PRN
Start: 2023-10-07 — End: 2023-10-07
  Administered 2023-10-07: 2 via RESPIRATORY_TRACT

## 2023-10-07 MED ORDER — CLEVIDIPINE BUTYRATE 0.5 MG/ML IV EMUL
0.0000 mg/h | INTRAVENOUS | Status: DC
  Administered 2023-10-07 – 2023-10-08 (×2): 2 mg/h via INTRAVENOUS
  Administered 2023-10-08: 17 mg/h via INTRAVENOUS
  Filled 2023-10-07 (×3): qty 50

## 2023-10-07 MED ORDER — SODIUM CHLORIDE (PF) 0.9 % IJ SOLN
INTRAVENOUS | Status: AC | PRN
  Administered 2023-10-07 (×4): 25 ug via INTRA_ARTERIAL

## 2023-10-07 MED ORDER — ASPIRIN 81 MG PO CHEW
CHEWABLE_TABLET | ORAL | Status: AC
  Filled 2023-10-07: qty 1

## 2023-10-07 MED ORDER — ETOMIDATE 2 MG/ML IV SOLN
INTRAVENOUS | Status: DC | PRN
  Administered 2023-10-07: 30 mg via INTRAVENOUS

## 2023-10-07 MED ORDER — ORAL CARE MOUTH RINSE
15.0000 mL | OROMUCOSAL | Status: DC
  Administered 2023-10-07 – 2023-10-11 (×46): 15 mL via OROMUCOSAL

## 2023-10-07 MED ORDER — TENECTEPLASE FOR STROKE
25.0000 mg | PACK | Freq: Once | INTRAVENOUS | Status: AC
  Administered 2023-10-07: 25 mg via INTRAVENOUS
  Filled 2023-10-07: qty 10

## 2023-10-07 MED ORDER — LABETALOL HCL 5 MG/ML IV SOLN
INTRAVENOUS | Status: DC | PRN
Start: 2023-10-07 — End: 2023-10-07
  Administered 2023-10-07: 10 mg via INTRAVENOUS

## 2023-10-07 MED ORDER — ORAL CARE MOUTH RINSE
15.0000 mL | OROMUCOSAL | Status: DC
  Administered 2023-10-07: 15 mL via OROMUCOSAL

## 2023-10-07 MED ORDER — ONDANSETRON HCL 4 MG/2ML IJ SOLN
4.0000 mg | Freq: Four times a day (QID) | INTRAMUSCULAR | Status: DC | PRN
  Administered 2023-10-13: 4 mg via INTRAVENOUS
  Filled 2023-10-07: qty 2

## 2023-10-07 MED ORDER — MIDAZOLAM HCL 2 MG/2ML IJ SOLN
INTRAMUSCULAR | Status: DC | PRN
  Administered 2023-10-07: 1 mg via INTRAVENOUS
  Administered 2023-10-07 (×2): 2 mg via INTRAVENOUS
  Administered 2023-10-07: 1 mg via INTRAVENOUS
  Administered 2023-10-07: 2 mg via INTRAVENOUS

## 2023-10-07 MED ORDER — SODIUM CHLORIDE 0.9 % IV SOLN: 250.0000 mL | INTRAVENOUS | Status: DC

## 2023-10-07 MED ORDER — ETOMIDATE 2 MG/ML IV SOLN
INTRAVENOUS | Status: AC
  Filled 2023-10-07: qty 20

## 2023-10-07 MED ORDER — AMLODIPINE BESYLATE 10 MG PO TABS
10.0000 mg | ORAL_TABLET | Freq: Every day | ORAL | Status: DC
  Filled 2023-10-07: qty 1

## 2023-10-07 MED ORDER — FENTANYL BOLUS VIA INFUSION
25.0000 ug | INTRAVENOUS | Status: DC | PRN
  Administered 2023-10-08 (×5): 100 ug via INTRAVENOUS

## 2023-10-07 MED ORDER — IOHEXOL 300 MG/ML  SOLN
150.0000 mL | Freq: Once | INTRAMUSCULAR | Status: AC | PRN
  Administered 2023-10-07: 35 mL via INTRA_ARTERIAL

## 2023-10-07 MED ORDER — STROKE: EARLY STAGES OF RECOVERY BOOK
Freq: Once | Status: AC
  Administered 2023-10-08: 1
  Filled 2023-10-07: qty 1

## 2023-10-07 MED ORDER — TICAGRELOR 60 MG PO TABS
ORAL_TABLET | ORAL | Status: AC | PRN
  Administered 2023-10-07: 90 mg

## 2023-10-07 MED ORDER — ACETAMINOPHEN 325 MG PO TABS
650.0000 mg | ORAL_TABLET | ORAL | Status: DC | PRN
  Administered 2023-10-14 – 2023-10-15 (×2): 650 mg via ORAL
  Filled 2023-10-07 (×2): qty 2

## 2023-10-07 MED ORDER — IOHEXOL 300 MG/ML  SOLN
150.0000 mL | Freq: Once | INTRAMUSCULAR | Status: AC | PRN
  Administered 2023-10-07: 100 mL via INTRA_ARTERIAL

## 2023-10-07 MED ORDER — FENTANYL CITRATE (PF) 100 MCG/2ML IJ SOLN
INTRAMUSCULAR | Status: DC | PRN
  Administered 2023-10-07: 50 ug via INTRAVENOUS

## 2023-10-07 MED ORDER — FENTANYL CITRATE PF 50 MCG/ML IJ SOSY
PREFILLED_SYRINGE | INTRAMUSCULAR | Status: AC
  Filled 2023-10-07: qty 2

## 2023-10-07 MED ORDER — DEXTROSE 50 % IV SOLN
12.5000 g | INTRAVENOUS | Status: AC
  Administered 2023-10-07: 12.5 g via INTRAVENOUS
  Filled 2023-10-07: qty 50

## 2023-10-07 MED ORDER — PHENYLEPHRINE HCL (PRESSORS) 10 MG/ML IV SOLN
INTRAVENOUS | Status: DC | PRN
  Administered 2023-10-07: 160 ug via INTRAVENOUS
  Administered 2023-10-07: 240 ug via INTRAVENOUS

## 2023-10-07 MED ORDER — INSULIN ASPART 100 UNIT/ML IJ SOLN
0.0000 [IU] | INTRAMUSCULAR | Status: DC
  Administered 2023-10-11: 1 [IU] via SUBCUTANEOUS

## 2023-10-07 MED ORDER — CANGRELOR TETRASODIUM 50 MG IV SOLR
INTRAVENOUS | Status: AC
Start: 2023-10-07 — End: 2023-10-07
  Filled 2023-10-07: qty 50

## 2023-10-07 NOTE — ED Notes (Signed)
 Critical Care paged per verbal order of Dr Dasie

## 2023-10-07 NOTE — ED Notes (Signed)
 Pt to IR

## 2023-10-07 NOTE — Progress Notes (Signed)
 PHARMACIST CODE STROKE RESPONSE  Notified to mix TNK at 9:05 by Dr. Merrianne TNK preparation completed at 9:08  TNK dose = 25 mg IV over 5 seconds  Issues/delays encountered (if applicable): Pt required significant sedation and intubation along with blood pressure management prior to administration of TNK.  Luchiano Viscomi, Vernell Helling 10/07/23 9:06 AM

## 2023-10-07 NOTE — Procedures (Signed)
 INR.  Status post right common carotid arteriogram.  Right CFA approach.  Findings.  1.  Occluded right middle cerebral artery M1 segment.  Status post revascularization of the occluded right middle cerebral artery M1 segment, the superior  division, and the inferior division to the mid M2 region, with 1 pass of 071 contact aspiration , and 1 pass with a 4 mm x 40 mm solitaire X  retriever, and proximal aspiration achieving a TICI 2B plus revascularization.  Post CT brain demonstrates focus of hyperattenuation in the distal posterior frontal subcortical region.  8 French Angio-Seal closure device deployed for hemostasis at the right groin puncture site.  Distal pulses all intact unchanged.  Patient left intubated due to the patient's medical condition.  GORMAN Nash MD.

## 2023-10-07 NOTE — Anesthesia Preprocedure Evaluation (Signed)
 Anesthesia Evaluation    Reviewed: Allergy & PrecautionsPreop documentation limited or incomplete due to emergent nature of procedure.  Airway Mallampati: Intubated  TM Distance: >3 FB     Dental no notable dental hx.    Pulmonary Current Smoker      + intubated    Cardiovascular hypertension,  Rhythm:Regular Rate:Normal     Neuro/Psych    GI/Hepatic   Endo/Other    Renal/GU      Musculoskeletal   Abdominal Normal abdominal exam  (+)   Peds  Hematology   Anesthesia Other Findings   Reproductive/Obstetrics                              Anesthesia Physical Anesthesia Plan  ASA: 4 and emergent  Anesthesia Plan: General   Post-op Pain Management:    Induction: Intravenous  PONV Risk Score and Plan: 1 and Ondansetron , Dexamethasone  and Treatment may vary due to age or medical condition  Airway Management Planned: Oral ETT  Additional Equipment: Arterial line  Intra-op Plan:   Post-operative Plan: Post-operative intubation/ventilation  Informed Consent:      Only emergency history available  Plan Discussed with: CRNA  Anesthesia Plan Comments:         Anesthesia Quick Evaluation

## 2023-10-07 NOTE — Anesthesia Procedure Notes (Signed)
 Arterial Line Insertion Start/End7/25/2025 10:15 AM, 10/07/2023 10:20 AM Performed by: Zelphia Norleen HERO, CRNA, CRNA  Patient location: Pre-op. Preanesthetic checklist: patient identified, IV checked, site marked, risks and benefits discussed, surgical consent, monitors and equipment checked, pre-op evaluation, timeout performed and anesthesia consent Lidocaine  1% used for infiltration Left, radial was placed Catheter size: 20 G Hand hygiene performed  and maximum sterile barriers used   Attempts: 1 Procedure performed using ultrasound guided technique. Ultrasound Notes:anatomy identified, needle tip was noted to be adjacent to the nerve/plexus identified and no ultrasound evidence of intravascular and/or intraneural injection Following insertion, dressing applied. Post procedure assessment: normal and unchanged  Patient tolerated the procedure well with no immediate complications.

## 2023-10-07 NOTE — Sedation Documentation (Signed)
 Patient arrived to IR3 intubated. Patient under the care of anesthesia at this point

## 2023-10-07 NOTE — Anesthesia Postprocedure Evaluation (Signed)
 Anesthesia Post Note  Patient: Nathan Campbell  Procedure(s) Performed: RADIOLOGY WITH ANESTHESIA     Patient location during evaluation: SICU Anesthesia Type: General Level of consciousness: sedated Pain management: pain level controlled Vital Signs Assessment: post-procedure vital signs reviewed and stable Respiratory status: patient remains intubated per anesthesia plan Cardiovascular status: stable Postop Assessment: no apparent nausea or vomiting Anesthetic complications: no   No notable events documented.  Last Vitals:  Vitals:   10/07/23 0947 10/07/23 1255  BP: (!) 156/102   Pulse: (!) 109 69  Resp: 17 16  Temp:    SpO2: 100% 100%    Last Pain:  Vitals:   10/07/23 0918  TempSrc: Temporal                 Cordella P Raydon Chappuis

## 2023-10-07 NOTE — Progress Notes (Signed)
 CBG of 69, taken on non-restricted side. Hypoglycemia standing order set intiated. 12.5 grams of dextrose  via D50 given. Repeat CBG on non-restricted side of 85.

## 2023-10-07 NOTE — ED Provider Notes (Signed)
 [place] [facility] DEPARTMENT AT [facility] Provider Note   CSN: [id number] Arrival date & time: 10/07/23  0827     Patient presents with: Code Stroke   Nathan Campbell is a 48 y.o. male.   48 year old male with history of end-stage disease presents due to strokelike symptoms.  Patient's last seen normal was approximately 6 AM.  Which is about 2 and half hours prior to ED arrival.  Patient was found to have left-sided upper and lower extremity weakness.  Also slurred speech.  He was last dialyzed yesterday.  No further history attainable due to his current state       Prior to Admission medications   Medication Sig Start Date End Date Taking? Authorizing Provider  acetaminophen  (TYLENOL ) 325 MG tablet Take 2 tablets (650 mg total) by mouth every 6 (six) hours as needed for moderate pain. 08/22/22   Christopher Savannah, PA-C  allopurinol  (ZYLOPRIM ) 100 MG tablet Take 1 tablet (100 mg total) by mouth daily. Do not begin until pain in right knee is resolved. 09/13/21   Joesph Shaver Scales, PA-C  amLODipine  (NORVASC ) 10 MG tablet Take 1 tablet (10 mg total) by mouth daily. 06/16/16   Gabriel Noa, MD  AURYXIA  1 GM 210 MG(Fe) tablet Take 420-630 mg by mouth See admin instructions. Take 630 mg with each meal and 420 to 630 mg with each snack 03/18/22   [provider]  azithromycin  (ZITHROMAX ) 250 MG tablet Day 1: take 2 tablets. Day 2-5: Take 1 tablet daily. 02/06/23   Christopher Savannah, PA-C  B Complex-C-Zn-Folic Acid (DIALYVITE 800/ZINC PO) Take 1 tablet by mouth daily.    [provider]  benzonatate  (TESSALON ) 100 MG capsule Take 1 capsule (100 mg total) by mouth 3 (three) times daily as needed for cough. 08/22/22   Christopher Savannah, PA-C  carvedilol  (COREG ) 25 MG tablet Take 25 mg by mouth 2 (two) times daily. 12/13/21   [provider]  cephALEXin  (KEFLEX ) 500 MG capsule Take 1 capsule (500 mg total) by mouth every 12 (twelve) hours. 03/28/22   Singh, Prashant K, MD   guaiFENesin -dextromethorphan (ROBITUSSIN DM) 100-10 MG/5ML syrup Take 5 mLs by mouth 3 (three) times daily as needed for cough. 03/06/23   Patsey Lot, MD  isosorbide  mononitrate (ISMO ) 10 MG tablet Take 10 mg by mouth daily. 03/11/22   [provider]  omeprazole  (PRILOSEC) 20 MG capsule Take 1 capsule (20 mg total) by mouth daily. Take after dialysis on dialysis days 09/28/23   Murrill, Samantha, FNP  predniSONE  (DELTASONE ) 20 MG tablet Take 2 tablets daily with breakfast. 02/06/23   Christopher Savannah, PA-C  promethazine -dextromethorphan (PROMETHAZINE -DM) 6.25-15 MG/5ML syrup Take 10 mLs by mouth every 6 (six) hours as needed for cough. 09/28/23   Murrill, Samantha, FNP  torsemide  (DEMADEX ) 20 MG tablet Take 60 mg by mouth 2 (two) times daily. 10/08/20   [provider]    Allergies: Patient has no known allergies.    Review of Systems  Unable to perform ROS: Acuity of condition    Updated Vital Signs BP (!) 215/96   Pulse 100   Temp 98.3 F (36.8 C) (Temporal)   Resp (!) 29   Wt (!) 144 kg   SpO2 100%   BMI 43.06 kg/m   Physical Exam Vitals and nursing note reviewed.  Constitutional:      General: He is in acute distress.     Appearance: Normal appearance. He is well-developed. He is toxic-appearing.  HENT:  Head: Normocephalic and atraumatic.  Eyes:     General: Lids are normal.     Conjunctiva/sclera: Conjunctivae normal.     Pupils: Pupils are equal, round, and reactive to light.  Neck:     Thyroid: No thyroid mass.     Trachea: No tracheal deviation.  Cardiovascular:     Rate and Rhythm: Normal rate and regular rhythm.     Heart sounds: Normal heart sounds. No murmur heard.    No gallop.  Pulmonary:     Effort: Pulmonary effort is normal. No respiratory distress.     Breath sounds: Normal breath sounds. No stridor. No decreased breath sounds, wheezing, rhonchi or rales.  Abdominal:     General: There is no distension.     Palpations: Abdomen  is soft.     Tenderness: There is no abdominal tenderness. There is no rebound.  Musculoskeletal:        General: No tenderness. Normal range of motion.     Cervical back: Normal range of motion and neck supple.  Skin:    General: Skin is warm and dry.     Findings: No abrasion or rash.  Neurological:     Mental Status: He is oriented to person, place, and time. He is lethargic.     GCS: GCS eye subscore is 3. GCS verbal subscore is 4. GCS motor subscore is 6.     Cranial Nerves: No cranial nerve deficit.     Sensory: No sensory deficit.     Motor: Weakness present. No tremor.     Comments: Left upper and left lower extremity strength approximately 2 of 5  Psychiatric:        Attention and Perception: Attention normal.        Speech: Speech normal.        Behavior: Behavior normal.     (all labs ordered are listed, but only abnormal results are displayed) Labs Reviewed  CBC - Abnormal; Notable for the following components:      Result Value   RBC 3.49 (*)    Hemoglobin 11.2 (*)    HCT 34.7 (*)    All other components within normal limits  I-STAT CHEM 8, ED - Abnormal; Notable for the following components:   BUN 57 (*)    Creatinine, Ser 12.00 (*)    Glucose, Bld 153 (*)    Calcium , Ion 1.11 (*)    Hemoglobin 11.6 (*)    HCT 34.0 (*)    All other components within normal limits  CBG MONITORING, ED - Abnormal; Notable for the following components:   Glucose-Capillary 145 (*)    All other components within normal limits  DIFFERENTIAL  ETHANOL  PROTIME-INR  APTT  COMPREHENSIVE METABOLIC PANEL WITH GFR  RAPID URINE DRUG SCREEN, HOSP PERFORMED  TROPONIN I (HIGH SENSITIVITY)    EKG: EKG Interpretation Date/Time:  Friday October 07 2023 09:20:42 EDT Ventricular Rate:  108 PR Interval:  162 QRS Duration:  91 QT Interval:  368 QTC Calculation: 494 R Axis:   2  Text Interpretation: Sinus tachycardia Biatrial enlargement Left ventricular hypertrophy Borderline prolonged  QT interval Confirmed by Dasie Faden ([id number]) on 10/07/2023 9:22:36 AM  Radiology: CT HEAD CODE STROKE WO CONTRAST Result Date: 10/07/2023 EXAM: CT HEAD WITHOUT 10/07/2023 08:41:14 AM TECHNIQUE: CT of the head was performed without the administration of intravenous contrast. Automated exposure control, iterative reconstruction, and/or weight based adjustment of the mA/kV was utilized to reduce the radiation dose to as low as  reasonably achievable. COMPARISON: None available. CLINICAL HISTORY: Neuro deficit, acute, stroke suspected. Lkw 5am, l/s paralysis, slurred speech; [place] [phone] FINDINGS: BRAIN AND VENTRICLES: Decreased gray-white differentiation is noted along the right insular cortex and right lentiform nucleus. Decreased density is present within the right caudate head. No acute hemorrhage or mass lesion is present. ORBITS: No acute abnormality. SINUSES AND MASTOIDS: No acute abnormality. SOFT TISSUES AND SKULL: No acute skull fracture. No acute soft tissue abnormality. VASCULATURE: Calcific density is noted in the proximal right M1 segment. Atherosclerotic calcifications are present within the cavernous internal carotid arteries. [place] stroke program early CT (ASPECT) score ----- Ganglionic (caudate, IC, lentiform nucleus, insula, M1-M3): 4 Supraganglionic (M4-M6): 3 Total: 7 IMPRESSION: 1. Findings consistent with acute right MCA territory ischemic changes, ASPECT score 7. 2. And calcific density is noted in the proximal right M1 segment. This is most concerning for a calcific embolus. Intracranial atherosclerotic changes considered less likely. 3. No acute hemorrhage. Critical findings were communicated to Dr. Merrianne via tracey at 8:50 am Electronically signed by: Lonni Necessary MD 10/07/2023 08:51 AM EDT RP Workstation: [id number]     Procedure Name: Intubation Date/Time: 10/07/2023 9:24 AM  Performed by: Dasie Faden, MDPre-anesthesia Checklist: Patient identified Oxygen  Delivery Method: Nasal cannula Preoxygenation: Pre-oxygenation with 100% oxygen Induction Type: Rapid sequence Ventilation: Mask ventilation with difficulty Laryngoscope Size: 1 Grade View: Grade III Tube size: 7.5 mm Number of attempts: 1 Airway Equipment and Method: Video-laryngoscopy Placement Confirmation: ETT inserted through vocal cords under direct vision, Positive ETCO2 and Breath sounds checked- equal and bilateral Secured at: 28 cm Tube secured with: ETT holder Dental Injury: Teeth and Oropharynx as per pre-operative assessment  Difficulty Due To: Difficulty was anticipated       Medications Ordered in the ED  rocuronium (ZEMURON) 100 MG/10ML injection (has no administration in time range)  succinylcholine (ANECTINE) 200 MG/10ML syringe (has no administration in time range)  etomidate (AMIDATE) 2 MG/ML injection (has no administration in time range)  fentaNYL  (SUBLIMAZE ) 50 MCG/ML injection (has no administration in time range)  tenecteplase (TNKASE) injection for Stroke 25 mg (has no administration in time range)  fentaNYL  in NS (58mcg/ml) infusion-PREMIX (has no administration in time range)  fentaNYL  (SUBLIMAZE ) bolus via infusion 25-100 mcg (has no administration in time range)  propofol  (DIPRIVAN ) 1000 MG/100ML infusion (has no administration in time range)  iohexol (OMNIPAQUE) 350 MG/ML injection 75 mL (100 mLs Intravenous Contrast Given 10/07/23 0858)                                    Medical Decision Making Amount and/or Complexity of Data Reviewed Labs: ordered. Radiology: ordered.  Risk Prescription drug management.   Patient's EKG shows sinus tachycardia.  Patient presented as code stroke.  Attempted to sedate patient multiple times with Versed  as well as fentanyl  unsuccessfully so the patient could have a CT scan.  Due to concern for airway, patient was intubated as above.  Neurology has been at bedside the entire time.  Blood pressure  noted and will be repeated and treated based on response to intubation.  Patient given postintubation orders for sedation.  9:37 AM Patient's blood pressure remains severely elevated after being intubated.  Patient's chest x-ray reviewed after intubation and ET tube was too far down.  It was retracted and repeat x-ray shows good placement.  Started on IV Cleviprex.  Patient rebolus with sedation.  Patient  has been seen by interventional radiologist as well and patient likely to go to angiography suite placed will consult critical care for admission  CRITICAL CARE Performed by: Curtistine ONEIDA Dawn Total critical care time: 75 minutes Critical care time was exclusive of separately billable procedures and treating other patients. Critical care was necessary to treat or prevent imminent or life-threatening deterioration. Critical care was time spent personally by me on the following activities: development of treatment plan with patient and/or surrogate as well as nursing, discussions with consultants, evaluation of patient's response to treatment, examination of patient, obtaining history from patient or surrogate, ordering and performing treatments and interventions, ordering and review of laboratory studies, ordering and review of radiographic studies, pulse oximetry and re-evaluation of patient's condition.        Final diagnoses:  None    ED Discharge Orders     None          Dawn Curtistine, MD 10/07/23 [phone]

## 2023-10-07 NOTE — H&P (Addendum)
 NEUROLOGY H&P NOTE   Date of service: October 07, 2023 Patient Name: Nathan Campbell MRN:  [id number] DOB:  April 07, 1975 Chief Complaint: Acute onset of left-sided weakness  History of Present Illness  Nathan Campbell is a 48 y.o. male with hx of systolic and diastolic CHF, anemia, end-stage renal disease on dialysis awaiting evaluation for renal transplant, hypertension and obesity who presents with acute onset left-sided weakness and neglect.  Patient attended his dialysis treatment yesterday and went to work last night and was in his normal state of health when he awoke at 5:00 this morning.  At 0630, his family noticed a thump from his room, and he was found to have acute onset left-sided weakness.  Patient was able to state that his left-sided weakness started at 0630 and that he was asymptomatic before then.  On presentation to the ED, he was restless and agitated and was noted to have left-sided neglect.  Due to the restlessness, CT angiogram was unable to be obtained, and patient ultimately had to be intubated for airway protection.  Last known well: 0630 (time of symptom onset) Modified rankin score: 0 IV Thrombolysis: Yes, given at 0939, delay due to managing patient's blood pressure and need for intubation for airway protection Thrombectomy: Yes, patient taken to interventional radiology at 09 52  NIHSS components Score: Comment  1a Level of Conscious 0[x]  1[]  2[]  3[]      1b LOC Questions 0[x]  1[]  2[]       1c LOC Commands 0[x]  1[]  2[]       2 Best Gaze 0[]  1[x]  2[]       3 Visual 0[]  1[]  2[x]  3[]      4 Facial Palsy 0[]  1[]  2[x]  3[]      5a Motor Arm - left 0[]  1[]  2[]  3[x]  4[]  UN[]    5b Motor Arm - Right 0[x]  1[]  2[]  3[]  4[]  UN[]    6a Motor Leg - Left 0[]  1[]  2[]  3[x]  4[]  UN[]    6b Motor Leg - Right 0[x]  1[]  2[]  3[]  4[]  UN[]    7 Limb Ataxia 0[x]  1[]  2[]  UN[]      8 Sensory 0[x]  1[]  2[]  UN[]      9 Best Language 0[x]  1[]  2[]  3[]      10 Dysarthria 0[]  1[x]  2[]  UN[]      11 Extinct. and  Inattention 0[]  1[x]  2[]       TOTAL:   13      ROS  Denies any headache. States that his chest is hurting. Unable to perform a comprehensive ROS due to acuity of presentation.   Past History   Past Medical History:  Diagnosis Date   Acute combined systolic and diastolic CHF, NYHA class 4 (HCC) 06/10/2016   Anemia    Cardiomyopathy (HCC) 06/14/2016   CHF (congestive heart failure) (HCC)    COVID 10/2020   Dyspnea    ESRD on hemodialysis (HCC)    TTS at [facility]   Hypertension    Hypertensive heart and kidney disease with heart failure (HCC) 06/14/2016   Hypertensive heart disease with congestive heart failure (HCC)    Obesity    Past Surgical History:  Procedure Laterality Date   A/V FISTULAGRAM Left 05/03/2022   Procedure: A/V Fistulagram;  Surgeon: Eliza Lonni RAMAN, MD;  Location: [facility] INVASIVE CV LAB;  Service: Cardiovascular;  Laterality: Left;   AV FISTULA PLACEMENT Left 11/11/2020   Procedure: LEFT ARM First Stage Basilic Vein Fistula Creation;  Surgeon: Sheree Penne Lonni, MD;  Location: [facility] OR;  Service: Vascular;  Laterality: Left;  BASCILIC VEIN TRANSPOSITION Left 01/23/2021   Procedure: Second Stage Basilic Vein Transposition of Left Arm Arteriovenous  Fistula;  Surgeon: Sheree Penne Bruckner, MD;  Location: [facility] OR;  Service: Vascular;  Laterality: Left;  Block  to LMA    COLONOSCOPY     FISTULA SUPERFICIALIZATION Left 05/07/2022   Procedure: PLICATION OF ANEURYSM, LEFT ARM FISTULA;  Surgeon: Eliza Bruckner RAMAN, MD;  Location: [facility] OR;  Service: Vascular;  Laterality: Left;   NO PAST SURGERIES     PERIPHERAL VASCULAR BALLOON ANGIOPLASTY Left 05/03/2022   Procedure: PERIPHERAL VASCULAR BALLOON ANGIOPLASTY;  Surgeon: Eliza Bruckner RAMAN, MD;  Location: [facility] INVASIVE CV LAB;  Service: Cardiovascular;  Laterality: Left;  AVF   Family History  Problem Relation Age of Onset   Hypertension Mother    Diabetes Father    Hypertension Father    Hypertension  Sister    Hypertension Paternal Grandmother    Diabetes Mellitus II Paternal Grandfather    Heart disease Cousin    Heart disease Cousin    Colon cancer Cousin    Esophageal cancer Neg Hx    Rectal cancer Neg Hx    Stomach cancer Neg Hx    Social History   Socioeconomic History   Marital status: Single    Spouse name: Not on file   Number of children: Not on file   Years of education: Not on file   Highest education level: Not on file  Occupational History   Not on file  Tobacco Use   Smoking status: Some Days    Current packs/day: 0.00    Types: Cigarettes    Last attempt to quit: 09/2015    Years since quitting: 8.0   Smokeless tobacco: Never   Tobacco comments:    occasionally  Vaping Use   Vaping status: Some Days  Substance and Sexual Activity   Alcohol use: Not Currently   Drug use: Not Currently   Sexual activity: Yes  Other Topics Concern   Not on file  Social History Narrative   Epworth Sleepiness Score: 6   Social Drivers of Health   Financial Resource Strain: Not on file  Food Insecurity: No Food Insecurity (03/26/2022)   Hunger Vital Sign    Worried About Running Out of Food in the Last Year: Never true    Ran Out of Food in the Last Year: Never true  Transportation Needs: No Transportation Needs (03/26/2022)   PRAPARE - Administrator, Civil Service (Medical): No    Lack of Transportation (Non-Medical): No  Physical Activity: Not on file  Stress: Not on file  Social Connections: Not on file   No Known Allergies  Medications   Current Facility-Administered Medications:    0.9 %  sodium chloride  infusion, 250 mL, Intravenous, PRN, Sheree Penne Bruckner, MD   etomidate  (AMIDATE ) 2 MG/ML injection, , , ,    etomidate  (AMIDATE ) injection, , Intravenous, Code/Trauma/Sedation Med, Dasie Faden, MD, 30 mg at 10/07/23 9085   fentaNYL  (SUBLIMAZE ) 50 MCG/ML injection, , , ,    fentaNYL  (SUBLIMAZE ) bolus via infusion 25-100 mcg, 25-100  mcg, Intravenous, Q15 min PRN, Dasie Faden, MD   fentaNYL  (SUBLIMAZE ) injection, , Intravenous, Code/Trauma/Sedation Med, Dasie Faden, MD, 100 mcg at 10/07/23 0852   fentaNYL  (SUBLIMAZE ) injection, , Intravenous, Code/Trauma/Sedation Med, Dasie Faden, MD, 50 mcg at 10/07/23 0902   fentaNYL  in NS (70mcg/ml) infusion-PREMIX, 0-400 mcg/hr, Intravenous, Continuous, Dasie Faden, MD, Last Rate: 15 mL/hr at 10/07/23 0941, 150 mcg/hr at  10/07/23 0941   midazolam  (VERSED ) injection, , Intravenous, Code/Trauma/Sedation Med, Dasie Faden, MD, 1 mg at 10/07/23 0900   propofol  (DIPRIVAN ) 1000 MG/100ML infusion, 0-80 mcg/kg/min, Intravenous, Continuous, Dasie Faden, MD, Last Rate: 30.2 mL/hr at 10/07/23 0932, 35 mcg/kg/min at 10/07/23 0932   sodium chloride  flush (NS) 0.9 % injection 3 mL, 3 mL, Intravenous, Q12H, Sheree Penne Bruckner, MD   succinylcholine  (ANECTINE ) 200 MG/10ML syringe, , , ,    succinylcholine  (ANECTINE ) injection, , Intravenous, Code/Trauma/Sedation Med, Dasie Faden, MD, 150 mg at 10/07/23 0915  Current Outpatient Medications:    acetaminophen  (TYLENOL ) 325 MG tablet, Take 2 tablets (650 mg total) by mouth every 6 (six) hours as needed for moderate pain., Disp: 30 tablet, Rfl: 0   allopurinol  (ZYLOPRIM ) 100 MG tablet, Take 1 tablet (100 mg total) by mouth daily. Do not begin until pain in right knee is resolved., Disp: 30 tablet, Rfl: 1   amLODipine  (NORVASC ) 10 MG tablet, Take 1 tablet (10 mg total) by mouth daily., Disp: 30 tablet, Rfl: 1   AURYXIA  1 GM 210 MG(Fe) tablet, Take 420-630 mg by mouth See admin instructions. Take 630 mg with each meal and 420 to 630 mg with each snack, Disp: , Rfl:    azithromycin  (ZITHROMAX ) 250 MG tablet, Day 1: take 2 tablets. Day 2-5: Take 1 tablet daily., Disp: 6 tablet, Rfl: 0   B Complex-C-Zn-Folic Acid (DIALYVITE 800/ZINC PO), Take 1 tablet by mouth daily., Disp: , Rfl:    benzonatate  (TESSALON ) 100 MG capsule, Take  1 capsule (100 mg total) by mouth 3 (three) times daily as needed for cough., Disp: 30 capsule, Rfl: 0   carvedilol  (COREG ) 25 MG tablet, Take 25 mg by mouth 2 (two) times daily., Disp: , Rfl:    cephALEXin  (KEFLEX ) 500 MG capsule, Take 1 capsule (500 mg total) by mouth every 12 (twelve) hours., Disp: 20 capsule, Rfl: 0   guaiFENesin -dextromethorphan (ROBITUSSIN DM) 100-10 MG/5ML syrup, Take 5 mLs by mouth 3 (three) times daily as needed for cough., Disp: 118 mL, Rfl: 0   isosorbide  mononitrate (ISMO ) 10 MG tablet, Take 10 mg by mouth daily., Disp: , Rfl:    omeprazole  (PRILOSEC) 20 MG capsule, Take 1 capsule (20 mg total) by mouth daily. Take after dialysis on dialysis days, Disp: 30 capsule, Rfl: 0   predniSONE  (DELTASONE ) 20 MG tablet, Take 2 tablets daily with breakfast., Disp: 10 tablet, Rfl: 0   promethazine -dextromethorphan (PROMETHAZINE -DM) 6.25-15 MG/5ML syrup, Take 10 mLs by mouth every 6 (six) hours as needed for cough., Disp: 118 mL, Rfl: 0   torsemide  (DEMADEX ) 20 MG tablet, Take 60 mg by mouth 2 (two) times daily., Disp: , Rfl:    Vitals   Vitals:   10/07/23 0925 10/07/23 0928 10/07/23 0938 10/07/23 0945  BP: (!) 223/148 (!) 239/186 (!) 161/108 (!) 166/85  Pulse: (!) 108 (!) 111 (!) 109 (!) 111  Resp: (!) 29 19 17  (!) 23  Temp:      TempSrc:      SpO2: 100% 96% 98% 100%  Weight:         Body mass index is 43.06 kg/m.  Physical Exam   Constitutional: Morbidly obese.  Psych: Restless and agitated Eyes: No scleral injection.  HENT: No OP obstruction.  Head: Normocephalic.  Cardiovascular: Normal rate and regular rhythm.  Respiratory: Respirations slightly labored on room air GI: Soft.  No distension.  Skin: WDI.   Neurologic Examination    NEURO:  Mental Status: Patient is alert  and responds to his name, is able to follow simple commands and can answer questions.  He is restless and agitated. Speech/Language: Speech is with moderate dysarthria, but naming is  intact  Cranial Nerves:  II: PERRL.  Blinks to threat on the right but not on the left III, IV, VI: Right gaze preference but able to cross midline VII: Left-sided facial droop VIII: hearing intact to voice. IX, X: Voice is dysarthric XII: Noncooperative with tongue protrusion  Motor: Patient moves right upper and lower extremity with good antigravity strength, some movement of left upper and lower extremities but unable to break gravity Tone is normal and bulk is normal  Sensation- Intact to noxious bilaterally but diminished on the left, extinction to DSS with light touch on the left side Coordination: Unable to perform Gait- Deferred   Labs   CBC:  Recent Labs  Lab 10/07/23 0849 10/07/23 0851  WBC  --  7.9  NEUTROABS  --  5.1  HGB 11.6* 11.2*  HCT 34.0* 34.7*  MCV  --  99.4  PLT  --  229   Basic Metabolic Panel:  Lab Results  Component Value Date   NA 140 10/07/2023   K 4.3 10/07/2023   CO2 24 10/07/2023   GLUCOSE 152 (H) 10/07/2023   BUN 62 (H) 10/07/2023   CREATININE 10.84 (H) 10/07/2023   CALCIUM  9.8 10/07/2023   GFRNONAA 5 (L) 10/07/2023   GFRAA 32 (L) 05/13/2017   Lipid Panel: No results found for: LDLCALC HgbA1c:  Lab Results  Component Value Date   HGBA1C 4.7 (L) 06/11/2016   Urine Drug Screen:     Component Value Date/Time   LABOPIA NONE DETECTED 06/10/2016 1743   COCAINSCRNUR NONE DETECTED 06/10/2016 1743   LABBENZ NONE DETECTED 06/10/2016 1743   AMPHETMU NONE DETECTED 06/10/2016 1743   THCU NONE DETECTED 06/10/2016 1743   LABBARB NONE DETECTED 06/10/2016 1743    Alcohol Level     Component Value Date/Time   ETH <15 10/07/2023 0851   INR  Lab Results  Component Value Date   INR 1.0 10/07/2023   APTT  Lab Results  Component Value Date   APTT 31 10/07/2023     CT Head without contrast (Personally reviewed): Acute right MCA territory ischemic changes, aspect score 7, calcific density noted in proximal right M1 segment,  concerning for calcific embolus  CT angio Head and Neck with contrast: Unable to obtain due to restlessness   Assessment  Quamaine Webb is a 48 y.o. male with history of ESRD on dialysis, CHF, HTN, obesity and anemia who presents with acute onset left-sided weakness, left-sided neglect, right gaze preference and dysarthria.  Initial CT head revealed right MCA territory ischemic changes with calcific density in proximal right M1 concerning for calcific embolus.  He was noted to be quite restless and agitated upon arrival to the ED, and agitation continued despite administration of multiple doses of Versed  and fentanyl .   - Exam reveals left sided deficits referable to the right MCA territory.  - CT head with acute right MCA territory ischemic changes, aspect score 7, calcific density noted in proximal right M1 segment, concerning for calcific embolus - Contraindications to TNK reviewed with family. He has no absolute contraindications.  - Discussion was had with patient's family regarding TNK administration with last known well of 06 30, with full risks/benefits described of treatment with TNK versus no thrombolytic and family agreed to administration.  - There was a delay in TNK administration due  to difficulty getting patient's blood pressure under control as well as agitation requiring sedation. Patient was intubated in the emergency department for airway protection.   - After TNK, the patient did not significantly improve. Discussion was had with family and interventional radiologist regarding CT findings, and given calcific density noted in right MCA, patient was taken directly to interventional radiology for angiogram and mechanical thrombectomy.  Primary Diagnosis:  Cerebral infarction due to embolism of  right middle cerebral artery.   Secondary Diagnoses: Hypertension Emergency (SBP > 180 or DBP > 120 & end organ damage), Chronic systolic (congestive) heart failure, and  ESRD  Recommendations  - Admit to ICU for monitoring post TNK and mechanical thrombectomy - Blood pressure parameters to be set by interventional radiologist after procedure, most likely systolic blood pressure to be maintained between 120 and 160 for 24 hours - Strict bedrest for 24 hours - Avoid needlesticks and tube insertions for 24 hours post TNK - MRI brain wo contrast - TTE w/ bubble - Check A1c and LDL + add statin per guidelines - Antiplt/anticoag to be determined 24 hours after TNK and after MRI - NIHSS and vital signs Q 15 minutes x 2 hours, every 30 minutes x 6 hours and hourly thereafter - STAT head CT for any change in neuro exam - Tele - PT/OT/SLP - Stroke education - Amb referral to neurology upon discharge    Addendum: VIR procedure successful: Findings of occluded right middle cerebral artery M1 segment. Status post revascularization of the occluded right middle cerebral artery M1 segment, the superior  division, and the inferior division to the mid M2 region, with 1 pass of 071 contact aspiration , and 1 pass with a 4 mm x 40 mm solitaire X  retriever, and proximal aspiration achieving a TICI 2B plus revascularization.  - Post-procedure CT brain demonstrates focus of hyperattenuation in the distal posterior frontal subcortical region. - Patient remained intubated after the procedure - CCM has been consulted  I personally spent a total of 70 minutes in the care of this critically ill patient today including getting/reviewing separately obtained history, performing a medically appropriate exam/evaluation, counseling and educating, placing orders, referring and communicating with other health care professionals, documenting clinical information in the EHR, independently interpreting results, communicating results, and coordinating care.   ______________________________________________________________________   Signed, Earle FORBES Everitt Clint Abbey, NP [facility] Patient seen by NP with MD.  Electronically signed: Dr. Camellia Shark    Risks, benefits and alternatives of IVT discussed with patient and/or family and they agreed. CTH personally reviewed prior to TNK administration

## 2023-10-07 NOTE — Progress Notes (Addendum)
 Bleeding from right femoral IR site noted at 1500. Pressure held for 15 mins. Deveshwar notified. Assessed with IR personnel, [place] who reports right groin was slightly more puffy than left side at baseline. Site redressed with quickclot. Knee immobilizer applied. Pulses palpable, site soft and vitals stable.

## 2023-10-07 NOTE — Code Documentation (Addendum)
 Stroke Response Nurse Documentation Code Documentation  Nathan Campbell is a 48 y.o. male arriving to [facility]  via [facility] on 10/07/2023 with past medical hx of systolic/diastolic HF, obesity, HTN, ESRD on HD, . On No antithrombotic. Code stroke was activated by EMS.   Patient from home where he was LKW at 0500 with symptom onset at 0630 and now complaining of left sided paralysis, slurred speech. Per EMS, patient received HD yesterday and went to work last night and woke up at 0500 in his usual state of health. Around 0630 he had an acute onset of slurred speech and left sided weakness.  Stroke team at the bedside on patient arrival. Labs drawn and patient cleared for CT by Dr. Peggye. Patient to CT with team. NIHSS 13, see documentation for details and code stroke times. Patient with right gaze preference , left hemianopia, left facial droop, left arm weakness, left leg weakness, dysarthria , and left neglect on exam. The following imaging was completed:  CT Head. Patient is a candidate for IV Thrombolytic per MD. Patient is a candidate for IR due to LVO noted on imaging per MD.   Care Plan: Patient take to IR with ED RN/SRN.   Process Delays Noted: patient uncooperative in CT scanner, patient needed intubation for airway protection, delay in TNK administration due to BP control;   Bedside handoff with IR RN Ginnie.    Annabella DELENA Bame  Stroke Response RN

## 2023-10-07 NOTE — ED Notes (Addendum)
 50mcg fentanyl  bolus given per Dr. Lindzen

## 2023-10-07 NOTE — Transfer of Care (Signed)
 Immediate Anesthesia Transfer of Care Note  Patient: Nathan Campbell  Procedure(s) Performed: RADIOLOGY WITH ANESTHESIA  Patient Location: ICU  Anesthesia Type:General  Level of Consciousness: Patient remains intubated per anesthesia plan  Airway & Oxygen Therapy: Patient remains intubated per anesthesia plan  Post-op Assessment: Report given to RN and Post -op Vital signs reviewed and stable  Post vital signs: Reviewed and stable  Last Vitals:  Vitals Value Taken Time  BP 1205/85   Temp 98   Pulse 70 10/07/23 13:06  Resp 22 10/07/23 13:06  SpO2 100 % 10/07/23 13:06  Vitals shown include unfiled device data.  Last Pain:  Vitals:   10/07/23 0918  TempSrc: Temporal         Complications: No notable events documented.

## 2023-10-07 NOTE — ED Triage Notes (Signed)
 Pt arrives via [facility] from home. PT activated as a Code Stroke. Pt had onset of left upper and lower extremity weakness and slurred speech that occurred around 0630 this morning. Pt does arrive AxOx4. Pt had a full session of dialysis yesterday. He does c/o chest pain as well.

## 2023-10-07 NOTE — Consult Note (Signed)
 NAME:  Nathan Campbell, MRN:  [id number], DOB:  06/26/1975, LOS: 0 ADMISSION DATE:  10/07/2023, CONSULTATION DATE:  7/25 REFERRING MD: Stroke, CHIEF COMPLAINT: Stroke  History of Present Illness:  48 year old male with past medical history of obesity, hypertension, end-stage renal disease on HD TTS, combined systolic and diastolic heart failure who presented to the emergency department on 7/25 with left-sided weakness, slurred speech.  LKW 0630.  Presented as a code stroke and was greeted by neurology at the bridge. CT head demonstrated acute right MCA infarct, ASPECTS 7.  Additionally he has calcified density along the proximal right M1 segment concerning for calcific embolus. CTA unable to be obtained for restlessness and concern over his airway so he was intubated. Received TNK @ [id number]. Taken to [facility] @ [phone] for angiogram and mechanical intervention.  Pertinent  Medical History  obesity, hypertension, end-stage renal disease on HD TTS, combined systolic and diastolic heart failure  Significant Hospital Events: Including procedures, antibiotic start and stop dates in addition to other pertinent events   7/25: Presented as a code stroke, admit  Interim History / Subjective:  Admit code stroke  Objective   Blood pressure (!) 239/186, pulse (!) 111, temperature 98.3 F (36.8 C), temperature source Temporal, resp. rate 19, weight (!) 144 kg, SpO2 96%.       No intake or output data in the 24 hours ending 10/07/23 0938 Filed Weights   10/07/23 0800  Weight: (!) 144 kg    Examination: General: middle aged male, laying flat, critically ill appearing  HENT: pupils pinpoint, non reactive; ncat, ett, ogt Lungs: ctab, diminished bases, vented  Cardiovascular: s1s2, no murmur Abdomen: protuberant, soft  Extremities: no pitting edema Neuro: sedated GU: foley  Resolved Hospital Problem list    Assessment & Plan:  Acute right MCA infarct s/p TNK and NIR mechanical thrombectomy  LKW 0630.  Left sided weakness and slurred speech. CT w/ acute right MCA infarct s/p TNK and TICI 2b revascularization.  - stroke team primary - goal SBP 120-140 - titrate cleviprex to SBP goal, prn labetalol   - F/u MRI Brain - F/u Echo, lipid panel, A1c - Frequent neuro checks - antiplatelets and coags per NSGY and neurology, nothing in next 24h post TNK administration  - Neuroprotective measures: HOB > 30 degrees, normoglycemia, normothermia, electrolytes WNL - PT/OT/SLP when able to participate in care   Acute hypoxic and hypercapneic respiratory failure 2/2 above  Intubated during CT for restlessness, concern for airway protection in setting of acute stroke.  - ABG in 1 hr, titrate vent - daily SAT/SBT  - full mechanical vent support - lung protective ventilation 6-8cc/kg Vt - VAP and PAD bundle in place  - titrate FiO2 to sat goal >92  - maintain peak/plats <30, driving pressures <84    Hypertension Home meds amlodipine , carvedilol , isosorbide  mononitrate  - cleviprex gtt for now  - SBP Goal 120-140  - prn labetalol , can add agents if needed    ESRD on HD TTS Anemia of chronic disease Last dialysis 7/24.  - consult nephrology   - trend bmp, mag, phos - replete elytes - strict I&O - f/u urine studies - Avoid nephrotoxic agents, renally dose medications - ensure adequate renal perfusion   Combined systolic and diastolic heart failure Most recent echo 03/2019 with LVEF 60-65%, LVH, G1 DD, RV mildly enlarged, biatrial moderate enlargement. On amlodipine , carvedilol , isosorbide  mononitrate, torsemide  60 BID -Follow-up repeat echo on admission - GDMT as able  - tele monitoring  Best Practice (right click and Reselect all SmartList Selections daily)   Diet/type: NPO DVT prophylaxis: SCD GI prophylaxis: PPI Lines: Arterial Line and yes and it is still needed Foley:  Yes, and it is still needed Code Status:  full code Last date of multidisciplinary goals of care discussion [per  primary]  Labs   CBC: Recent Labs  Lab 10/07/23 0849 10/07/23 0851  WBC  --  7.9  NEUTROABS  --  5.1  HGB 11.6* 11.2*  HCT 34.0* 34.7*  MCV  --  99.4  PLT  --  229    Basic Metabolic Panel: Recent Labs  Lab 10/07/23 0849 10/07/23 0851  NA 140 140  K 4.3 4.3  CL 101 98  CO2  --  24  GLUCOSE 153* 152*  BUN 57* 62*  CREATININE 12.00* 10.84*  CALCIUM   --  9.8   GFR: Estimated Creatinine Clearance: 12.7 mL/min (A) (by C-G formula based on SCr of 10.84 mg/dL (H)). Recent Labs  Lab 10/07/23 0851  WBC 7.9    Liver Function Tests: Recent Labs  Lab 10/07/23 0851  AST 15  ALT 13  ALKPHOS 82  BILITOT 0.6  PROT 7.2  ALBUMIN 3.7   No results for input(s): LIPASE, AMYLASE in the last 168 hours. No results for input(s): AMMONIA in the last 168 hours.  ABG    Component Value Date/Time   TCO2 25 10/07/2023 0849     Coagulation Profile: Recent Labs  Lab 10/07/23 0851  INR 1.0    Cardiac Enzymes: No results for input(s): CKTOTAL, CKMB, CKMBINDEX, TROPONINI in the last 168 hours.  HbA1C: Hgb A1c MFr Bld  Date/Time Value Ref Range Status  06/11/2016 10:57 AM 4.7 (L) 4.8 - 5.6 % Final    Comment:    (NOTE)         Pre-diabetes: 5.7 - 6.4         Diabetes: >6.4         Glycemic control for adults with diabetes: <7.0     CBG: Recent Labs  Lab 10/07/23 0829  GLUCAP 145*    Review of Systems:   As above   Past Medical History:  He,  has a past medical history of Acute combined systolic and diastolic CHF, NYHA class 4 (HCC) (06/10/2016), Anemia, Cardiomyopathy (HCC) (06/14/2016), CHF (congestive heart failure) (HCC), COVID (10/2020), Dyspnea, ESRD on hemodialysis (HCC), Hypertension, Hypertensive heart and kidney disease with heart failure (HCC) (06/14/2016), Hypertensive heart disease with congestive heart failure (HCC), and Obesity.   Surgical History:   Past Surgical History:  Procedure Laterality Date   A/V FISTULAGRAM Left  05/03/2022   Procedure: A/V Fistulagram;  Surgeon: Eliza Lonni RAMAN, MD;  Location: [facility] INVASIVE CV LAB;  Service: Cardiovascular;  Laterality: Left;   AV FISTULA PLACEMENT Left 11/11/2020   Procedure: LEFT ARM First Stage Basilic Vein Fistula Creation;  Surgeon: Sheree Penne Lonni, MD;  Location: [facility] OR;  Service: Vascular;  Laterality: Left;   BASCILIC VEIN TRANSPOSITION Left 01/23/2021   Procedure: Second Stage Basilic Vein Transposition of Left Arm Arteriovenous  Fistula;  Surgeon: Sheree Penne Lonni, MD;  Location: [facility] OR;  Service: Vascular;  Laterality: Left;  Block  to LMA    COLONOSCOPY     FISTULA SUPERFICIALIZATION Left 05/07/2022   Procedure: PLICATION OF ANEURYSM, LEFT ARM FISTULA;  Surgeon: Eliza Lonni RAMAN, MD;  Location: [facility] OR;  Service: Vascular;  Laterality: Left;   NO PAST SURGERIES     PERIPHERAL VASCULAR BALLOON ANGIOPLASTY  Left 05/03/2022   Procedure: PERIPHERAL VASCULAR BALLOON ANGIOPLASTY;  Surgeon: Eliza Lonni RAMAN, MD;  Location: [facility] INVASIVE CV LAB;  Service: Cardiovascular;  Laterality: Left;  AVF     Social History:   reports that he has been smoking cigarettes. He has never used smokeless tobacco. He reports that he does not currently use alcohol. He reports that he does not currently use drugs.   Family History:  His family history includes Colon cancer in his cousin; Diabetes in his father; Diabetes Mellitus II in his paternal grandfather; Heart disease in his cousin and cousin; Hypertension in his father, mother, paternal grandmother, and sister. There is no history of Esophageal cancer, Rectal cancer, or Stomach cancer.   Allergies No Known Allergies   Home Medications  Prior to Admission medications   Medication Sig Start Date End Date Taking? Authorizing Provider  acetaminophen  (TYLENOL ) 325 MG tablet Take 2 tablets (650 mg total) by mouth every 6 (six) hours as needed for moderate pain. 08/22/22   Christopher Savannah, PA-C  allopurinol   (ZYLOPRIM ) 100 MG tablet Take 1 tablet (100 mg total) by mouth daily. Do not begin until pain in right knee is resolved. 09/13/21   Joesph Shaver Scales, PA-C  amLODipine  (NORVASC ) 10 MG tablet Take 1 tablet (10 mg total) by mouth daily. 06/16/16   Gabriel Noa, MD  AURYXIA  1 GM 210 MG(Fe) tablet Take 420-630 mg by mouth See admin instructions. Take 630 mg with each meal and 420 to 630 mg with each snack 03/18/22   [provider]  azithromycin  (ZITHROMAX ) 250 MG tablet Day 1: take 2 tablets. Day 2-5: Take 1 tablet daily. 02/06/23   Christopher Savannah, PA-C  B Complex-C-Zn-Folic Acid (DIALYVITE 800/ZINC PO) Take 1 tablet by mouth daily.    [provider]  benzonatate  (TESSALON ) 100 MG capsule Take 1 capsule (100 mg total) by mouth 3 (three) times daily as needed for cough. 08/22/22   Christopher Savannah, PA-C  carvedilol  (COREG ) 25 MG tablet Take 25 mg by mouth 2 (two) times daily. 12/13/21   [provider]  cephALEXin  (KEFLEX ) 500 MG capsule Take 1 capsule (500 mg total) by mouth every 12 (twelve) hours. 03/28/22   Singh, Prashant K, MD  guaiFENesin -dextromethorphan (ROBITUSSIN DM) 100-10 MG/5ML syrup Take 5 mLs by mouth 3 (three) times daily as needed for cough. 03/06/23   Patsey Lot, MD  isosorbide  mononitrate (ISMO ) 10 MG tablet Take 10 mg by mouth daily. 03/11/22   [provider]  omeprazole  (PRILOSEC) 20 MG capsule Take 1 capsule (20 mg total) by mouth daily. Take after dialysis on dialysis days 09/28/23   Murrill, Samantha, FNP  predniSONE  (DELTASONE ) 20 MG tablet Take 2 tablets daily with breakfast. 02/06/23   Christopher Savannah, PA-C  promethazine -dextromethorphan (PROMETHAZINE -DM) 6.25-15 MG/5ML syrup Take 10 mLs by mouth every 6 (six) hours as needed for cough. 09/28/23   Iola Lukes, FNP  torsemide  (DEMADEX ) 20 MG tablet Take 60 mg by mouth 2 (two) times daily. 10/08/20   [provider]     Critical care time: 6   Tinnie FORBES Adolph DEVONNA [facility] 10/07/23 9:45 AM  Please see [email or web address] for pager details.  From 7A-7P if no response, please call [phone] After hours, please call ELink [phone]

## 2023-10-07 NOTE — Consult Note (Signed)
 Renal Service Consult Note Nathan Campbell  Nathan Campbell 10/07/2023 Nathan JONETTA Fret, MD Requesting Physician: Dr. Merrianne  Reason for Consult: ESRD pt w/ acute CVA HPI: The patient is a 48 y.o. year-old w/ PMH as below who presented to ED brought from home by EMS as code stroke. P had L upper and lower ext weakness w/ slurred speech early this am. Had full HD yesterday. In ED BP's, HR and RR were stable. Cret 10.8, BUN 62, K+ 4.3, Alb 3.7. WBC 7K, Hb 11.2. Pt was intubated. CXR no sig disease. Started on IV cleviprex. Head CT showed R MCA infarct. Rx'd w/ TNK. Also taken to [facility] for angiogram and possible intervention. Pt admitted to CCM. We are asked to see for ESRD.    Pt seen in ICU room. Pt is on vent and sedated. No hx obtained.    ROS - n/a    Past Medical History  Past Medical History:  Diagnosis Date   Acute combined systolic and diastolic CHF, NYHA class 4 (HCC) 06/10/2016   Anemia    Cardiomyopathy (HCC) 06/14/2016   CHF (congestive heart failure) (HCC)    COVID 10/2020   Dyspnea    ESRD on hemodialysis (HCC)    TTS at [facility]   Hypertension    Hypertensive heart and kidney disease with heart failure (HCC) 06/14/2016   Hypertensive heart disease with congestive heart failure (HCC)    Obesity    Past Surgical History  Past Surgical History:  Procedure Laterality Date   A/V FISTULAGRAM Left 05/03/2022   Procedure: A/V Fistulagram;  Surgeon: Eliza Lonni RAMAN, MD;  Location: [facility] INVASIVE CV LAB;  Service: Cardiovascular;  Laterality: Left;   AV FISTULA PLACEMENT Left 11/11/2020   Procedure: LEFT ARM First Stage Basilic Vein Fistula Creation;  Surgeon: Sheree Penne Lonni, MD;  Location: [facility] OR;  Service: Vascular;  Laterality: Left;   BASCILIC VEIN TRANSPOSITION Left 01/23/2021   Procedure: Second Stage Basilic Vein Transposition of Left Arm Arteriovenous  Fistula;  Surgeon: Sheree Penne Lonni, MD;  Location: [facility] OR;  Service: Vascular;   Laterality: Left;  Block  to LMA    COLONOSCOPY     FISTULA SUPERFICIALIZATION Left 05/07/2022   Procedure: PLICATION OF ANEURYSM, LEFT ARM FISTULA;  Surgeon: Eliza Lonni RAMAN, MD;  Location: [facility] OR;  Service: Vascular;  Laterality: Left;   NO PAST SURGERIES     PERIPHERAL VASCULAR BALLOON ANGIOPLASTY Left 05/03/2022   Procedure: PERIPHERAL VASCULAR BALLOON ANGIOPLASTY;  Surgeon: Eliza Lonni RAMAN, MD;  Location: [facility] INVASIVE CV LAB;  Service: Cardiovascular;  Laterality: Left;  AVF   Family History  Family History  Problem Relation Age of Onset   Hypertension Mother    Diabetes Father    Hypertension Father    Hypertension Sister    Hypertension Paternal Grandmother    Diabetes Mellitus II Paternal Grandfather    Heart disease Cousin    Heart disease Cousin    Colon cancer Cousin    Esophageal cancer Neg Hx    Rectal cancer Neg Hx    Stomach cancer Neg Hx    Social History  reports that he has been smoking cigarettes. He has never used smokeless tobacco. He reports that he does not currently use alcohol. He reports that he does not currently use drugs. Allergies No Known Allergies Home medications Prior to Admission medications   Medication Sig Start Date End Date Taking? Authorizing Provider  acetaminophen  (TYLENOL ) 325 MG tablet Take 2 tablets (650 mg  total) by mouth every 6 (six) hours as needed for moderate pain. 08/22/22   Christopher Savannah, PA-C  allopurinol  (ZYLOPRIM ) 100 MG tablet Take 1 tablet (100 mg total) by mouth daily. Do not begin until pain in right knee is resolved. 09/13/21   Joesph Shaver Scales, PA-C  amLODipine  (NORVASC ) 10 MG tablet Take 1 tablet (10 mg total) by mouth daily. 06/16/16   Gabriel Noa, MD  AURYXIA  1 GM 210 MG(Fe) tablet Take 420-630 mg by mouth See admin instructions. Take 630 mg with each meal and 420 to 630 mg with each snack 03/18/22   [provider]  azithromycin  (ZITHROMAX ) 250 MG tablet Day 1: take 2 tablets. Day 2-5: Take 1 tablet  daily. 02/06/23   Christopher Savannah, PA-C  B Complex-C-Zn-Folic Acid (DIALYVITE 800/ZINC PO) Take 1 tablet by mouth daily.    [provider]  benzonatate  (TESSALON ) 100 MG capsule Take 1 capsule (100 mg total) by mouth 3 (three) times daily as needed for cough. 08/22/22   Christopher Savannah, PA-C  carvedilol  (COREG ) 25 MG tablet Take 25 mg by mouth 2 (two) times daily. 12/13/21   [provider]  cephALEXin  (KEFLEX ) 500 MG capsule Take 1 capsule (500 mg total) by mouth every 12 (twelve) hours. 03/28/22   Singh, Prashant K, MD  guaiFENesin -dextromethorphan (ROBITUSSIN DM) 100-10 MG/5ML syrup Take 5 mLs by mouth 3 (three) times daily as needed for cough. 03/06/23   Patsey Lot, MD  isosorbide  mononitrate (ISMO ) 10 MG tablet Take 10 mg by mouth daily. 03/11/22   [provider]  omeprazole  (PRILOSEC) 20 MG capsule Take 1 capsule (20 mg total) by mouth daily. Take after dialysis on dialysis days 09/28/23   Murrill, Samantha, FNP  predniSONE  (DELTASONE ) 20 MG tablet Take 2 tablets daily with breakfast. 02/06/23   Christopher Savannah, PA-C  promethazine -dextromethorphan (PROMETHAZINE -DM) 6.25-15 MG/5ML syrup Take 10 mLs by mouth every 6 (six) hours as needed for cough. 09/28/23   Murrill, Samantha, FNP  torsemide  (DEMADEX ) 20 MG tablet Take 60 mg by mouth 2 (two) times daily. 10/08/20   [provider]     Vitals:   10/07/23 1315 10/07/23 1330 10/07/23 1345 10/07/23 1400  BP: 116/77 118/77 118/77 116/77  Pulse: 69 68 66 66  Resp: (!) 22 (!) 22 (!) 22 (!) 22  Temp:      TempSrc:      SpO2: 99% 99% 99% 97%  Weight:       Exam Gen on vent, sedated No rash, cyanosis or gangrene Sclera anicteric, throat w/ ETT No jvd or bruits Chest clear anterior/ lateral RRR no MRG Abd soft ntnd no mass or ascites +bs GU normal MS no joint effusions or deformity Ext no LE edema, no wounds or ulcers Neuro is on vent, sedated     AVF+bruit   Home bp meds: Norvasc  10 every day Coreg  25  bid Torsemide  60mg  bid   OP HD: SW TTS 4h   B500   138kg   2K bath AVF   Heparin  4000+ Wt gains 3.5- 5 kg on average Usually comes off 2-4kg over Last OP HD 7/24, post wt 142kg   Assessment/ Plan: Acute CVA: w/ L sided weakness. S/P TNK 7/25. Also went to NIR. Intubated in ICU now. Per CCM.   ESRD: on HD TTS. Had full HD yesterday. Plan HD tomorrow.  HTN: per CCM Volume: usual is 2-3 kg over post HD. UF same w/ HD tomorrow.  Anemia of esrd: Hb 11-  12, no esa needs.        Myer Fret  MD [facility] 10/07/2023, 3:24 PM  Recent Labs  Lab 10/07/23 0849 10/07/23 0851  HGB 11.6* 11.2*  ALBUMIN  --  3.7  CALCIUM   --  9.8  CREATININE 12.00* 10.84*  K 4.3 4.3   Inpatient medications:  [START ON 10/08/2023]  stroke: early stages of recovery book   Does not apply Once   Chlorhexidine  Gluconate Cloth  6 each Topical Daily   etomidate       fentaNYL        insulin aspart  0-6 Units Subcutaneous Q4H   labetalol   20 mg Intravenous Once   mouth rinse  15 mL Mouth Rinse Q2H   mouth rinse  15 mL Mouth Rinse Q2H   pantoprazole (PROTONIX) IV  40 mg Intravenous QHS   succinylcholine        sodium chloride      sodium chloride      clevidipine     fentaNYL  infusion INTRAVENOUS 150 mcg/hr (10/07/23 0956)   norepinephrine (LEVOPHED) Adult infusion     propofol  (DIPRIVAN ) infusion 50 mcg/kg/min (10/07/23 1329)   acetaminophen  **OR** acetaminophen  (TYLENOL ) oral liquid 160 mg/5 mL **OR** acetaminophen , etomidate, fentaNYL , fentaNYL , ondansetron  (ZOFRAN ) IV, mouth rinse, mouth rinse, senna-docusate, succinylcholine

## 2023-10-07 NOTE — Progress Notes (Signed)
 CCM aware that phlebotomy cannot get labs due to clotting.

## 2023-10-07 NOTE — ED Notes (Signed)
 Dr Dasie, Dr. Levander, RT at bedside for intubation

## 2023-10-07 NOTE — Progress Notes (Signed)
 Echocardiogram 2D Echocardiogram has been performed.  Damien FALCON Icess Bertoni RDCS 10/07/2023, 3:05 PM

## 2023-10-08 ENCOUNTER — Inpatient Hospital Stay (HOSPITAL_COMMUNITY)

## 2023-10-08 DIAGNOSIS — I779 Disorder of arteries and arterioles, unspecified: Secondary | ICD-10-CM | POA: Diagnosis not present

## 2023-10-08 DIAGNOSIS — J9602 Acute respiratory failure with hypercapnia: Secondary | ICD-10-CM | POA: Diagnosis not present

## 2023-10-08 DIAGNOSIS — I63311 Cerebral infarction due to thrombosis of right middle cerebral artery: Secondary | ICD-10-CM | POA: Diagnosis not present

## 2023-10-08 DIAGNOSIS — Z992 Dependence on renal dialysis: Secondary | ICD-10-CM

## 2023-10-08 DIAGNOSIS — J9601 Acute respiratory failure with hypoxia: Secondary | ICD-10-CM

## 2023-10-08 DIAGNOSIS — N186 End stage renal disease: Secondary | ICD-10-CM

## 2023-10-08 DIAGNOSIS — I63411 Cerebral infarction due to embolism of right middle cerebral artery: Secondary | ICD-10-CM | POA: Diagnosis not present

## 2023-10-08 DIAGNOSIS — E785 Hyperlipidemia, unspecified: Secondary | ICD-10-CM

## 2023-10-08 DIAGNOSIS — I5040 Unspecified combined systolic (congestive) and diastolic (congestive) heart failure: Secondary | ICD-10-CM

## 2023-10-08 DIAGNOSIS — I1311 Hypertensive heart and chronic kidney disease without heart failure, with stage 5 chronic kidney disease, or end stage renal disease: Secondary | ICD-10-CM

## 2023-10-08 DIAGNOSIS — I10 Essential (primary) hypertension: Secondary | ICD-10-CM | POA: Diagnosis not present

## 2023-10-08 LAB — COMPREHENSIVE METABOLIC PANEL WITH GFR
ALT: 11 U/L (ref 0–44)
AST: 10 U/L — ABNORMAL LOW (ref 15–41)
Albumin: 3.1 g/dL — ABNORMAL LOW (ref 3.5–5.0)
Alkaline Phosphatase: 66 U/L (ref 38–126)
Anion gap: 14 (ref 5–15)
BUN: 72 mg/dL — ABNORMAL HIGH (ref 6–20)
CO2: 22 mmol/L (ref 22–32)
Calcium: 8.6 mg/dL — ABNORMAL LOW (ref 8.9–10.3)
Chloride: 99 mmol/L (ref 98–111)
Creatinine, Ser: 12.81 mg/dL — ABNORMAL HIGH (ref 0.61–1.24)
GFR, Estimated: 4 mL/min — ABNORMAL LOW (ref >60)
Glucose, Bld: 84 mg/dL (ref 70–99)
Potassium: 4.6 mmol/L (ref 3.5–5.1)
Sodium: 135 mmol/L (ref 135–145)
Total Bilirubin: 0.8 mg/dL (ref 0.0–1.2)
Total Protein: 6.1 g/dL — ABNORMAL LOW (ref 6.5–8.1)

## 2023-10-08 LAB — CBC WITH DIFFERENTIAL/PLATELET
Abs Immature Granulocytes: 0.05 K/uL (ref 0.00–0.07)
Basophils Absolute: 0 K/uL (ref 0.0–0.1)
Basophils Relative: 1 %
Eosinophils Absolute: 0.2 K/uL (ref 0.0–0.5)
Eosinophils Relative: 3 %
HCT: 29.8 % — ABNORMAL LOW (ref 39.0–52.0)
Hemoglobin: 9.8 g/dL — ABNORMAL LOW (ref 13.0–17.0)
Immature Granulocytes: 1 %
Lymphocytes Relative: 11 %
Lymphs Abs: 1 K/uL (ref 0.7–4.0)
MCH: 32.5 pg (ref 26.0–34.0)
MCHC: 32.9 g/dL (ref 30.0–36.0)
MCV: 98.7 fL (ref 80.0–100.0)
Monocytes Absolute: 1.2 K/uL — ABNORMAL HIGH (ref 0.1–1.0)
Monocytes Relative: 14 %
Neutro Abs: 6.2 K/uL (ref 1.7–7.7)
Neutrophils Relative %: 70 %
Platelets: 203 K/uL (ref 150–400)
RBC: 3.02 MIL/uL — ABNORMAL LOW (ref 4.22–5.81)
RDW: 13.1 % (ref 11.5–15.5)
WBC: 8.6 K/uL (ref 4.0–10.5)
nRBC: 0 % (ref 0.0–0.2)

## 2023-10-08 LAB — LDL CHOLESTEROL, DIRECT: Direct LDL: 85 mg/dL (ref 0–99)

## 2023-10-08 LAB — HEMOGLOBIN A1C
Hgb A1c MFr Bld: 5.1 % (ref 4.8–5.6)
Mean Plasma Glucose: 99.67 mg/dL

## 2023-10-08 LAB — LIPID PANEL
Cholesterol: 130 mg/dL (ref 0–200)
HDL: 23 mg/dL — ABNORMAL LOW (ref >40)
LDL Cholesterol: UNDETERMINED mg/dL (ref 0–99)
Total CHOL/HDL Ratio: 5.7 ratio
Triglycerides: 436 mg/dL — ABNORMAL HIGH (ref <150)
VLDL: UNDETERMINED mg/dL (ref 0–40)

## 2023-10-08 LAB — GLUCOSE, CAPILLARY
Glucose-Capillary: 95 mg/dL (ref 70–99)
Glucose-Capillary: 102 mg/dL — ABNORMAL HIGH (ref 70–99)
Glucose-Capillary: 86 mg/dL (ref 70–99)
Glucose-Capillary: 93 mg/dL (ref 70–99)
Glucose-Capillary: 93 mg/dL (ref 70–99)
Glucose-Capillary: 106 mg/dL — ABNORMAL HIGH (ref 70–99)

## 2023-10-08 LAB — TRIGLYCERIDES
Triglycerides: 494 mg/dL — ABNORMAL HIGH (ref <150)
Triglycerides: 203 mg/dL — ABNORMAL HIGH (ref <150)

## 2023-10-08 LAB — HEPATITIS B SURFACE ANTIBODY, QUANTITATIVE: Hep B S AB Quant (Post): 36.9 m[IU]/mL

## 2023-10-08 LAB — HIV ANTIBODY (ROUTINE TESTING W REFLEX): HIV Screen 4th Generation wRfx: NONREACTIVE

## 2023-10-08 MED ORDER — DEXMEDETOMIDINE HCL IN NACL 400 MCG/100ML IV SOLN
0.0000 ug/kg/h | INTRAVENOUS | Status: DC
  Administered 2023-10-08 (×2): 0.4 ug/kg/h via INTRAVENOUS
  Administered 2023-10-08: 0.5 ug/kg/h via INTRAVENOUS
  Administered 2023-10-09 (×4): 0.6 ug/kg/h via INTRAVENOUS
  Administered 2023-10-10: 0.7 ug/kg/h via INTRAVENOUS
  Administered 2023-10-10: 0.6 ug/kg/h via INTRAVENOUS
  Administered 2023-10-10: 0.7 ug/kg/h via INTRAVENOUS
  Administered 2023-10-10: 0.8 ug/kg/h via INTRAVENOUS
  Administered 2023-10-10 (×2): 0.7 ug/kg/h via INTRAVENOUS
  Administered 2023-10-11 (×2): 0.9 ug/kg/h via INTRAVENOUS
  Filled 2023-10-08 (×11): qty 100
  Filled 2023-10-08: qty 300
  Filled 2023-10-08 (×2): qty 100

## 2023-10-08 MED ORDER — ASPIRIN 81 MG PO CHEW
81.0000 mg | CHEWABLE_TABLET | Freq: Every day | ORAL | Status: DC
  Administered 2023-10-08 – 2023-10-18 (×11): 81 mg
  Filled 2023-10-08 (×11): qty 1

## 2023-10-08 MED ORDER — FUROSEMIDE 10 MG/ML IJ SOLN
80.0000 mg | Freq: Once | INTRAMUSCULAR | Status: AC
  Administered 2023-10-08: 80 mg via INTRAVENOUS
  Filled 2023-10-08: qty 8

## 2023-10-08 MED ORDER — HEPARIN SODIUM (PORCINE) 5000 UNIT/ML IJ SOLN
5000.0000 [IU] | Freq: Three times a day (TID) | INTRAMUSCULAR | Status: DC
  Administered 2023-10-08 – 2023-10-18 (×31): 5000 [IU] via SUBCUTANEOUS
  Filled 2023-10-08 (×31): qty 1

## 2023-10-08 MED ORDER — ASPIRIN 300 MG RE SUPP: 300.0000 mg | Freq: Every day | RECTAL | Status: DC

## 2023-10-08 NOTE — Plan of Care (Signed)
 Progressing however only 24 hrs post stroke so monitoring for complications before we can anticipate a linear positive progression

## 2023-10-08 NOTE — Progress Notes (Signed)
 PT Cancellation Note  Patient Details Name: Nathan Campbell MRN: [id number] DOB: 1976-02-15   Cancelled Treatment:    Reason Eval/Treat Not Completed: Active bedrest order - will check back tomorrow.   Caldonia Leap S, PT DPT Acute Rehabilitation Services Secure Chat Preferred  Office [phone]    [name] [place] 10/08/2023, 11:35 AM

## 2023-10-08 NOTE — Progress Notes (Addendum)
 NAME:  Nathan Campbell, MRN:  [id number], DOB:  08/12/1975, LOS: 1 ADMISSION DATE:  10/07/2023, CONSULTATION DATE:  7/25 REFERRING MD: Stroke, CHIEF COMPLAINT: Stroke  History of Present Illness:  48 year old male with past medical history of obesity, hypertension, end-stage renal disease on HD TTS, combined systolic and diastolic heart failure who presented to the emergency department on 7/25 with left-sided weakness, slurred speech.  LKW 0630.  Presented as a code stroke and was greeted by neurology at the bridge. CT head demonstrated acute right MCA infarct, ASPECTS 7.  Additionally he has calcified density along the proximal right M1 segment concerning for calcific embolus. CTA unable to be obtained for restlessness and concern over his airway so he was intubated. Received TNK @ [id number]. Taken to [facility] @ [phone] for angiogram and mechanical intervention.  Pertinent  Medical History  obesity, hypertension, end-stage renal disease on HD TTS, combined systolic and diastolic heart failure  Significant Hospital Events: Including procedures, antibiotic start and stop dates in addition to other pertinent events   7/25: Presented as a code stroke, admit , brought to ICU intubated, PEEP of 10 added  Interim History / Subjective:   Critically ill, intubated, Sedated on propofol  and fentanyl  Remains hypoxic on 70%/PEEP of 10 Hypotensive overnight requiring low-dose Levophed   Objective   Blood pressure 138/80, pulse 73, temperature 99.8 F (37.7 C), temperature source Axillary, resp. rate (!) 22, weight (!) 141.6 kg, SpO2 95%.    Vent Mode: PRVC FiO2 (%):  [60 %-100 %] 60 % Set Rate:  [16 bmp-22 bmp] 22 bmp Vt Set:  [620 mL] 620 mL PEEP:  [5 cmH20-10 cmH20] 10 cmH20 Plateau Pressure:  [25 cmH20-28 cmH20] 25 cmH20   Intake/Output Summary (Last 24 hours) at 10/08/2023 9162 Last data filed at 10/08/2023 0800 Gross per 24 hour  Intake 956.64 ml  Output 95 ml  Net 861.64 ml   Filed Weights    10/07/23 0800 10/08/23 0500  Weight: (!) 144 kg (!) 141.6 kg    Examination: General: middle aged morbidly obese man, intubated HENT: pupils pinpoint, non reactive; ncat, ett, ogt Lungs: Deep breath sounds bilateral, no accessory muscle use Cardiovascular: s1s2, no murmur Abdomen: Obese, soft Extremities: no pitting edema Neuro: sedated , RASS -2 GU: foley  Resolved Hospital Problem list    Assessment & Plan:  Acute right MCA infarct s/p TNK and NIR mechanical thrombectomy  LKW 0630. Left sided weakness and slurred speech. CT w/ acute right MCA infarct s/p TNK and TICI 2b revascularization.  - stroke team primary - F/u MRI Brain - Frequent neuro checks - antiplatelets and coags per NSGY and neurology, nothing in next 24h post TNK administration  - Neuroprotective measures: HOB > 30 degrees, normoglycemia, normothermia, electrolytes WNL - PT/OT/SLP when able to participate in care  -Switch to Precedex  from propofol , goal RASS 0 to -1  Acute hypoxic and hypercapneic respiratory failure 2/2 above  Intubated during CT for restlessness, concern for airway protection in setting of acute stroke.  Persistent hypoxia may be due to pulm hypertension or due to fluid overload, doubt aspiration - daily SAT/SBT , maintain PEEP at 10 until FiO2 down to 50% expect to improve after dialysis - full mechanical vent support - lung protective ventilation 6-8cc/kg Vt - VAP and PAD bundle in place  - titrate FiO2 to sat goal >92  - maintain peak/plats <30, driving pressures <84    Hypertension Home meds amlodipine , carvedilol , isosorbide  mononitrate  - Off Cleviprex  - SBP Goal 120-140  -  Holding home meds until blood pressure improves Now needing low-dose Levophed  for hypotension/cerebral perfusion  ESRD on HD TTS Anemia of chronic disease Last dialysis 7/24.  - Renal assisting, HD planned today -Lasix  80 x 1 - trend bmp, mag, phos - strict I&O - Avoid nephrotoxic agents, renally dose  medications   Combined systolic and diastolic heart failure Echo shows enlarged RA/RV, nondiagnostic bubble study  , worsening of pre-existing secondary pulm hypertension -on amlodipine , carvedilol , isosorbide  mononitrate, torsemide  60 BID  - GDMT when blood pressure improved - tele monitoring   Best Practice (right click and Reselect all SmartList Selections daily)   Diet/type: NPO start tube feeds if remains intubated today DVT prophylaxis: SCD GI prophylaxis: PPI Lines: Arterial Line and yes and it is still needed Foley:  Yes, and it is still needed Code Status:  full code Last date of multidisciplinary goals of care discussion [per primary]  Labs   CBC: Recent Labs  Lab 10/07/23 0849 10/07/23 0851 10/07/23 1551 10/08/23 0505  WBC  --  7.9  --  8.6  NEUTROABS  --  5.1  --  6.2  HGB 11.6* 11.2* 9.5* 9.8*  HCT 34.0* 34.7* 28.0* 29.8*  MCV  --  99.4  --  98.7  PLT  --  229  --  203    Basic Metabolic Panel: Recent Labs  Lab 10/07/23 0849 10/07/23 0851 10/07/23 1551 10/08/23 0505  NA 140 140 139 135  K 4.3 4.3 4.3 4.6  CL 101 98  --  99  CO2  --  24  --  22  GLUCOSE 153* 152*  --  84  BUN 57* 62*  --  72*  CREATININE 12.00* 10.84*  --  12.81*  CALCIUM   --  9.8  --  8.6*   GFR: Estimated Creatinine Clearance: 10.6 mL/min (A) (by C-G formula based on SCr of 12.81 mg/dL (H)). Recent Labs  Lab 10/07/23 0851 10/08/23 0505  WBC 7.9 8.6    Liver Function Tests: Recent Labs  Lab 10/07/23 0851 10/08/23 0505  AST 15 10*  ALT 13 11  ALKPHOS 82 66  BILITOT 0.6 0.8  PROT 7.2 6.1*  ALBUMIN 3.7 3.1*   No results for input(s): LIPASE, AMYLASE in the last 168 hours. No results for input(s): AMMONIA in the last 168 hours.  ABG    Component Value Date/Time   PHART 7.424 10/07/2023 1551   PCO2ART 38.2 10/07/2023 1551   PO2ART 70 (L) 10/07/2023 1551   HCO3 25.0 10/07/2023 1551   TCO2 26 10/07/2023 1551   O2SAT 94 10/07/2023 1551     Coagulation  Profile: Recent Labs  Lab 10/07/23 0851  INR 1.0    Cardiac Enzymes: No results for input(s): CKTOTAL, CKMB, CKMBINDEX, TROPONINI in the last 168 hours.  HbA1C: Hgb A1c MFr Bld  Date/Time Value Ref Range Status  10/08/2023 05:05 AM 5.1 4.8 - 5.6 % Final    Comment:    (NOTE) Diagnosis of Diabetes The following HbA1c ranges recommended by the American Diabetes Association (ADA) may be used as an aid in the diagnosis of diabetes mellitus.  Hemoglobin             Suggested A1C NGSP%              Diagnosis  <5.7                   Non Diabetic  5.7-6.4  Pre-Diabetic  >6.4                   Diabetic  <7.0                   Glycemic control for                       adults with diabetes.    06/11/2016 10:57 AM 4.7 (L) 4.8 - 5.6 % Final    Comment:    (NOTE)         Pre-diabetes: 5.7 - 6.4         Diabetes: >6.4         Glycemic control for adults with diabetes: <7.0     CBG: Recent Labs  Lab 10/07/23 1927 10/07/23 1959 10/07/23 2321 10/08/23 0325 10/08/23 0734  GLUCAP 69* 85 80 95 102*    Critical care time: 35 m   Marrissa Dai V. [name], MD [facility] 10/08/23 8:37 AM  Please see [email or web address] for pager details.  From 7A-7P if no response, please call [phone] After hours, please call ELink [phone]

## 2023-10-08 NOTE — Progress Notes (Addendum)
 STROKE TEAM PROGRESS NOTE   INTERIM HISTORY/SUBJECTIVE He presented with sudden onset of left hemiplegia with NIH stroke scale of 13 and received IV TNK after CT head showed what looked like a calcific embolus in the right MCA and a good ASPECT  score and CT angiogram could not be obtained with emergent cerebral catheter angiogram showed right M1 occlusion and he underwent  TICI 2 b revascularization of the superior and inferior division to the M2 region but was left intubated for medical issues given history of CHF and renal failure  He remains sedated and intubated.  He is arousable.  Follows commands quite well on the right side.  He is able to move left lower extremity trace.  Left upper extremity remains plegic   Plan to extubate later today or tomorrow depending on weaning after MRI.  Dialysis later today today.  No family at the bedside OBJECTIVE  CBC    Component Value Date/Time   WBC 8.6 10/08/2023 0505   RBC 3.02 (L) 10/08/2023 0505   HGB 9.8 (L) 10/08/2023 0505   HGB 9.2 (L) 01/22/2021 1235   HCT 29.8 (L) 10/08/2023 0505   HCT 28.9 (L) 01/22/2021 1235   PLT 203 10/08/2023 0505   PLT 208 01/22/2021 1235   MCV 98.7 10/08/2023 0505   MCV 93 01/22/2021 1235   MCH 32.5 10/08/2023 0505   MCHC 32.9 10/08/2023 0505   RDW 13.1 10/08/2023 0505   RDW 12.0 01/22/2021 1235   LYMPHSABS 1.0 10/08/2023 0505   LYMPHSABS 0.8 01/22/2021 1235   MONOABS 1.2 (H) 10/08/2023 0505   EOSABS 0.2 10/08/2023 0505   EOSABS 0.1 01/22/2021 1235   BASOSABS 0.0 10/08/2023 0505   BASOSABS 0.0 01/22/2021 1235    BMET    Component Value Date/Time   NA 135 10/08/2023 0505   K 4.6 10/08/2023 0505   CL 99 10/08/2023 0505   CO2 22 10/08/2023 0505   GLUCOSE 84 10/08/2023 0505   BUN 72 (H) 10/08/2023 0505   CREATININE 12.81 (H) 10/08/2023 0505   CALCIUM  8.6 (L) 10/08/2023 0505   GFRNONAA 4 (L) 10/08/2023 0505    IMAGING past 24 hours ECHOCARDIOGRAM COMPLETE Result Date: 10/07/2023     ECHOCARDIOGRAM REPORT   Patient Name:   Nathan Campbell Date of Exam: 10/07/2023 Medical Rec #:  [id number]      Height:       72.0 in Accession #:    [id number]     Weight:       317.5 lb Date of Birth:  1975/06/26     BSA:          2.593 m Patient Age:    47 years       BP:           126/81 mmHg Patient Gender: M              HR:           64 bpm. Exam Location:  Inpatient Procedure: 2D Echo, Cardiac Doppler and Color Doppler (Both Spectral and Color            Flow Doppler were utilized during procedure). Indications:    Stroke i63.9  History:        Patient has prior history of Echocardiogram examinations, most                 recent 04/12/2019. CHF, ESRD; Risk Factors:Hypertension.  Sonographer:    Damien Senior RDCS Referring Phys: [id number] CORTNEY E DE LA  TORRE  Sonographer Comments: Bubble study attempted 2x IMPRESSIONS  1. Left ventricular ejection fraction, by estimation, is 55 to 60%. The left ventricle has normal function. The left ventricle has no regional wall motion abnormalities. There is severe concentric left ventricular hypertrophy. Left ventricular diastolic  parameters are consistent with Grade II diastolic dysfunction (pseudonormalization).  2. Right ventricular systolic function is normal. The right ventricular size is severely enlarged. Tricuspid regurgitation signal is inadequate for assessing PA pressure.  3. Left atrial size was mildly dilated.  4. Right atrial size was severely dilated.  5. The mitral valve is normal in structure. Trivial mitral valve regurgitation.  6. The aortic valve is tricuspid. Aortic valve regurgitation is not visualized.  7. There is mild dilatation of the ascending aorta, measuring 41 mm.  8. Bubble study is nondiagnostic. FINDINGS  Left Ventricle: Left ventricular ejection fraction, by estimation, is 55 to 60%. The left ventricle has normal function. The left ventricle has no regional wall motion abnormalities. The left ventricular internal cavity size was normal in  size. There is  severe concentric left ventricular hypertrophy. Left ventricular diastolic parameters are consistent with Grade II diastolic dysfunction (pseudonormalization). Right Ventricle: The right ventricular size is severely enlarged. No increase in right ventricular wall thickness. Right ventricular systolic function is normal. Tricuspid regurgitation signal is inadequate for assessing PA pressure. Left Atrium: Left atrial size was mildly dilated. Right Atrium: Right atrial size was severely dilated. Pericardium: There is no evidence of pericardial effusion. Mitral Valve: The mitral valve is normal in structure. Trivial mitral valve regurgitation. Tricuspid Valve: The tricuspid valve is normal in structure. Tricuspid valve regurgitation is trivial. Aortic Valve: The aortic valve is tricuspid. Aortic valve regurgitation is not visualized. Pulmonic Valve: The pulmonic valve was normal in structure. Pulmonic valve regurgitation is trivial. Aorta: The aortic root is normal in size and structure. There is mild dilatation of the ascending aorta, measuring 41 mm. Venous: IVC assessment for right atrial pressure unable to be performed due to mechanical ventilation. IAS/Shunts: The interatrial septum was not well visualized. Agitated saline contrast was given intravenously to evaluate for intracardiac shunting. Bubble study is nondiagnostic.  LEFT VENTRICLE PLAX 2D LVIDd:         4.30 cm   Diastology LVIDs:         3.10 cm   LV e' medial:    5.00 cm/s LV PW:         1.80 cm   LV E/e' medial:  12.8 LV IVS:        2.20 cm   LV e' lateral:   5.55 cm/s LVOT diam:     2.00 cm   LV E/e' lateral: 11.5 LV SV:         63 LV SV Index:   24 LVOT Area:     3.14 cm  RIGHT VENTRICLE RV S prime:     13.60 cm/s TAPSE (M-mode): 2.2 cm LEFT ATRIUM             Index        RIGHT ATRIUM           Index LA diam:        5.30 cm 2.04 cm/m   RA Area:     34.60 cm LA Vol (A2C):   97.5 ml 37.60 ml/m  RA Volume:   125.00 ml 48.21 ml/m LA  Vol (A4C):   88.6 ml 34.17 ml/m LA Biplane Vol: 99.7 ml 38.45 ml/m  AORTIC VALVE LVOT  Vmax:   91.40 cm/s LVOT Vmean:  68.000 cm/s LVOT VTI:    0.200 m  AORTA Ao Root diam: 3.30 cm Ao Asc diam:  4.10 cm MITRAL VALVE MV Area (PHT): 3.12 cm    SHUNTS MV Decel Time: 243 msec    Systemic VTI:  0.20 m MV E velocity: 63.90 cm/s  Systemic Diam: 2.00 cm MV A velocity: 59.30 cm/s MV E/A ratio:  1.08 Morene Brownie Electronically signed by Morene Brownie Signature Date/Time: 10/07/2023/3:29:45 PM    Final    DG CHEST PORT 1 VIEW Result Date: 10/07/2023 CLINICAL DATA:  Encounter for intubation EXAM: PORTABLE CHEST 1 VIEW COMPARISON:  October 07, 2023 at 9:33:54 a.m. FINDINGS: Endotracheal tube terminates 1.7 cm proximal to carina. Enteric tube 2 is in place extending below left hemidiaphragm tip outside of the field of view. Cardiomegaly, stable to prior accentuated by low lung volume. There are patchy opacities involving bilateral perihilar lungs increase in right lung apex. Bilateral vascular congestion. Blunting of left costophrenic angle. No pleural effusion on the right. No acute osseous abnormality. IMPRESSION: Endotracheal tube tip 1.7 cm proximal to carina. Increased patchy opacities of bilateral perihilar and right hemithorax may represent pulmonary edema or airspace consolidations. Electronically Signed   By: Megan  Zare M.D.   On: 10/07/2023 14:07   DG Chest Portable 1 View Result Date: 10/07/2023 CLINICAL DATA:  ETT Placement EXAM: PORTABLE CHEST 1 VIEW COMPARISON:  Same day chest radiograph dated 10/07/2023 at 9:25 a.m. FINDINGS: Interval retraction of the endotracheal tube which now terminates approximately 4.3 cm above the carina. Enteric tube courses below the left hemidiaphragm, beyond the field of view. Mild left basilar atelectasis. No other significant change compared to the prior exam. Persistent low lung volumes with accentuation of the cardiac silhouette and bronchovascular crowding. IMPRESSION: 1.  Interval retraction of the endotracheal tube which now terminates approximately 4.3 cm above the carina. 2. Mild left basilar atelectasis. Electronically Signed   By: Harrietta Sherry M.D.   On: 10/07/2023 09:49   DG Chest Portable 1 View Result Date: 10/07/2023 CLINICAL DATA:  post intubation EXAM: PORTABLE CHEST 1 VIEW COMPARISON:  09/14/2023. FINDINGS: Endotracheal tube tip terminates in the proximal right mainstem bronchus, retraction by at least 4 cm is recommended. Enteric tube tip and side port in the stomach. Low lung volumes with accentuation of the cardiac silhouette and bronchovascular crowding. No sizable pleural effusion or pneumothorax. No acute osseous abnormality identified. IMPRESSION: 1. Endotracheal tube tip terminates in the proximal right mainstem bronchus, retraction by at least 4 cm is recommended. 2.  Enteric tube tip and side port in the stomach. Electronically Signed   By: Harrietta Sherry M.D.   On: 10/07/2023 09:47    Vitals:   10/08/23 0745 10/08/23 0800 10/08/23 0813 10/08/23 0815  BP: 135/75 136/79  138/80  Pulse: 74 76 86 73  Resp: (!) 22 (!) 22 (!) 22 (!) 22  Temp:      TempSrc:      SpO2: 90% 93% 97% 95%  Weight:         PHYSICAL EXAM General:  Sedated CV: Regular rate and rhythm on monitor Respiratory: Mechanically ventilated GI: Abdomen soft and nontender   NEURO:  Mental Status: Sedated, intubated, no verbal output.  Is able to follow one-step commands Speech/Language:Following commands, sedated  Cranial Nerves:  II:  PERRL III, IV, VI: EOMI. Eyelids elevate symmetrically.  VII: Obscured by ETT VIII: hearing intact to voice. IX, X: Cough and gag intact  KP:Yzji midline  XII: tongue is midline without fasciculations. Motor:  Moves right upper and lower extremity against gravity purposefully.  Able to move left lower extremity slightly.  Left upper extremity appears plegic and flaccid   RUE withdraws to pain Tone: is normal and bulk is  normal Sensation- Withdraws in all extremities   Coordination: Unable to complete Gait- deferred   ASSESSMENT/PLAN  Nathan Campbell is a 48 y.o. male with history of obesity, hypertension, end-stage renal disease on HD TTS, combined systolic and diastolic heart failure who presented to the emergency department on 7/25 with left-sided weakness, slurred speech.  NIH on Admission 13  Acute Ischemic Infarct:  right MCA territory infarct  s/p mechanical thrombectomy and TNK Etiology: Embolic versus large vessel disease Code Stroke CT head - Findings consistent with acute right MCA territory ischemic changes, ASPECT score 7. And calcific density is noted in the proximal right M1 segment. This is most concerning for a calcific embolus. Intracranial atherosclerotic changes considered less likely. MRI   Large right MCA territory infarct with associated diffuse cortical thickening and some mass effect MRA  Distal right M2 occlusion, somewhat more proximal than on the final angio images. No new occlusion is present. 2D Echo LA mildly dilated, right atria fairly dilated.  EF 50-55% LDL 85 HgbA1c 5.1 VTE prophylaxis -heparin  No antithrombotic prior to admission, now on aspirin  81 mg daily and aspirin  300 mg suppository daily  Therapy recommendations:  Pending Disposition:  Neuro ICU  S/p thrombectomy of R M1 with TICI 2B revascularization  Intubated prior to procedure due to agitation   Acute hypoxic and hypercapnic respiratory failure Extubate per CCM SBT/ VAP protocol  Propofol  -> precedex   Combined systolic and diastolic heart failure Hypertension Pulmonary hypertension Home meds: Amlodipine , isosorbide  mononitrate, furosemide , carvedilol  Resume GDMT when BP stabilized Stable Blood Pressure Goal: BP less than 180/105   Hyperlipidemia 4LDL 85 goal < 70 Add atorvastatin Continue statin at discharge  Elevated triglycerides [phone] Switch propofol  to Precedex   ESRD on  HD Nephrology consulted HD 7/26   Hospital day # 1  Patient seen and examined by NP/APP with MD. MD to update note as needed.   Jorene Last, DNP, FNP-BC [facility] Pager: [phone]  I have personally obtained history,examined this patient, reviewed notes, independently viewed imaging studies, participated in medical decision making and plan of care.ROS completed by me personally and pertinent positives fully documented  I have made any additions or clarifications directly to the above note. Agree with note above.  He presented with sudden onset of left hemiaplasia with NIH stroke scale of 13 and CT head showing calcific embolus in the right MCA and received IV TNK and CT angiogram could not be obtained.  Catheter angiogram showed right M1 occlusion and he underwent mechanical thrombectomy with TICI 2B revascularization.  Recommend continued close neurological observation and strict blood pressure control as per post TNK and thrombectomy protocols.  Wean off ventilatory support as tolerated.  Check MRI scan.  Consider extubation as tolerated per critical care team.  No family available at the bedside.  Discussed with Dr. [name] critical care medicine.  Start aspirin  after MRI if no bleed This patient is critically ill and at significant risk of neurological worsening, death and care requires constant monitoring of vital signs, hemodynamics,respiratory and cardiac monitoring, extensive review of multiple databases, frequent neurological assessment, discussion with family, other specialists and medical decision making of high complexity.I have made any additions or clarifications directly to the  above note.This critical care time does not reflect procedure time, or teaching time or supervisory time of PA/NP/Med Resident etc but could involve care discussion time.  I spent 30 minutes of neurocritical care time  in the care of  this patient.      Eather Popp, MD Medical  Director [facility] Pager: [phone] 10/08/2023 4:19 PM   To contact Stroke Continuity provider, please refer to [email or web address]. After hours, contact General Neurology

## 2023-10-08 NOTE — Progress Notes (Signed)
 OT Cancellation Note  Patient Details Name: Nathan Campbell MRN: [id number] DOB: 1975-09-04   Cancelled Treatment:    Reason Eval/Treat Not Completed: Active bedrest order  Joycelynn Fritsche K, OTD, OTR/L [facility]erred Acute Rehab [phone]   [name] 10/08/2023, 11:47 AM

## 2023-10-08 NOTE — Progress Notes (Signed)
 Several attempts made to contact MRI about scheduled 24 hour TNK MRI for 1000 today. MRI unavailable d/t having one MRI compatible ventilator and multiple pts needing scans/delays in scheduling.   NP made aware.

## 2023-10-08 NOTE — Progress Notes (Signed)
  Received patient in bed to unit.   Informed consent signed and in chart.    TX duration: 3HRS     Bedside ICU Hand-off given to patient's nurse.    Access used: LAVF Access issues: none   Total UF removed: 3.0L Medication(s) given: none Post HD VS: wnl Post HD weight: 138.0kg     Olivia Hurst LPN [facility]

## 2023-10-08 NOTE — Progress Notes (Signed)
 Pt transported from 4N22 to MRI and back on vent w/o complications.

## 2023-10-08 NOTE — Progress Notes (Signed)
 SLP Cancellation Note  Patient Details Name: Nathan Campbell MRN: [id number] DOB: [birth year]   Cancelled treatment:       Reason Eval/Treat Not Completed: Patient not medically ready. Pt remains intubated. Will continue f/u.    Wilder Kin, MA, CCC-SLP Acute Rehabilitation Services Office Number: [phone]  Wilder KANDICE Kin 10/08/2023, 10:51 AM

## 2023-10-09 ENCOUNTER — Inpatient Hospital Stay (HOSPITAL_COMMUNITY)

## 2023-10-09 DIAGNOSIS — I63411 Cerebral infarction due to embolism of right middle cerebral artery: Secondary | ICD-10-CM | POA: Diagnosis not present

## 2023-10-09 DIAGNOSIS — G936 Cerebral edema: Secondary | ICD-10-CM

## 2023-10-09 DIAGNOSIS — I63311 Cerebral infarction due to thrombosis of right middle cerebral artery: Secondary | ICD-10-CM | POA: Diagnosis not present

## 2023-10-09 DIAGNOSIS — J9602 Acute respiratory failure with hypercapnia: Secondary | ICD-10-CM | POA: Diagnosis not present

## 2023-10-09 DIAGNOSIS — I10 Essential (primary) hypertension: Secondary | ICD-10-CM | POA: Diagnosis not present

## 2023-10-09 DIAGNOSIS — I779 Disorder of arteries and arterioles, unspecified: Secondary | ICD-10-CM | POA: Diagnosis not present

## 2023-10-09 DIAGNOSIS — R1310 Dysphagia, unspecified: Secondary | ICD-10-CM

## 2023-10-09 DIAGNOSIS — J9601 Acute respiratory failure with hypoxia: Secondary | ICD-10-CM | POA: Diagnosis not present

## 2023-10-09 LAB — BASIC METABOLIC PANEL WITH GFR
Anion gap: 18 — ABNORMAL HIGH (ref 5–15)
BUN: 53 mg/dL — ABNORMAL HIGH (ref 6–20)
CO2: 24 mmol/L (ref 22–32)
Calcium: 9.6 mg/dL (ref 8.9–10.3)
Chloride: 94 mmol/L — ABNORMAL LOW (ref 98–111)
Creatinine, Ser: 11.53 mg/dL — ABNORMAL HIGH (ref 0.61–1.24)
GFR, Estimated: 5 mL/min — ABNORMAL LOW (ref >60)
Glucose, Bld: 97 mg/dL (ref 70–99)
Potassium: 4.7 mmol/L (ref 3.5–5.1)
Sodium: 136 mmol/L (ref 135–145)

## 2023-10-09 LAB — POCT I-STAT 7, (LYTES, BLD GAS, ICA,H+H)
Acid-Base Excess: 1 mmol/L (ref 0.0–2.0)
Bicarbonate: 29.2 mmol/L — ABNORMAL HIGH (ref 20.0–28.0)
Calcium, Ion: 1.18 mmol/L (ref 1.15–1.40)
HCT: 31 % — ABNORMAL LOW (ref 39.0–52.0)
Hemoglobin: 10.5 g/dL — ABNORMAL LOW (ref 13.0–17.0)
O2 Saturation: 100 %
Potassium: 4.6 mmol/L (ref 3.5–5.1)
Sodium: 140 mmol/L (ref 135–145)
TCO2: 31 mmol/L (ref 22–32)
pCO2 arterial: 66.7 mmHg (ref 32–48)
pH, Arterial: 7.249 — ABNORMAL LOW (ref 7.35–7.45)
pO2, Arterial: 281 mmHg — ABNORMAL HIGH (ref 83–108)

## 2023-10-09 LAB — GLUCOSE, CAPILLARY
Glucose-Capillary: 92 mg/dL (ref 70–99)
Glucose-Capillary: 98 mg/dL (ref 70–99)
Glucose-Capillary: 104 mg/dL — ABNORMAL HIGH (ref 70–99)
Glucose-Capillary: 108 mg/dL — ABNORMAL HIGH (ref 70–99)
Glucose-Capillary: 106 mg/dL — ABNORMAL HIGH (ref 70–99)
Glucose-Capillary: 113 mg/dL — ABNORMAL HIGH (ref 70–99)

## 2023-10-09 LAB — MAGNESIUM: Magnesium: 2.1 mg/dL (ref 1.7–2.4)

## 2023-10-09 LAB — PHOSPHORUS: Phosphorus: 8.8 mg/dL — ABNORMAL HIGH (ref 2.5–4.6)

## 2023-10-09 MED ORDER — AMLODIPINE BESYLATE 5 MG PO TABS
5.0000 mg | ORAL_TABLET | Freq: Every day | ORAL | Status: DC
  Administered 2023-10-09 – 2023-10-10 (×2): 5 mg
  Filled 2023-10-09 (×2): qty 1

## 2023-10-09 MED ORDER — VITAL HP 1.0 CAL PO LIQD
1000.0000 mL | ORAL | Status: DC
  Administered 2023-10-09: 1000 mL

## 2023-10-09 MED ORDER — CHLORHEXIDINE GLUCONATE CLOTH 2 % EX PADS: 6.0000 | MEDICATED_PAD | Freq: Every day | CUTANEOUS | Status: DC

## 2023-10-09 MED ORDER — CARVEDILOL 6.25 MG PO TABS
6.2500 mg | ORAL_TABLET | Freq: Two times a day (BID) | ORAL | Status: DC
  Administered 2023-10-09 – 2023-10-17 (×14): 6.25 mg
  Filled 2023-10-09 (×7): qty 2
  Filled 2023-10-09 (×4): qty 1
  Filled 2023-10-09 (×2): qty 2

## 2023-10-09 NOTE — Evaluation (Signed)
 Occupational Therapy Evaluation Patient Details Name: Nathan Campbell MRN: [id number] DOB: [birth year] Today's Date: 10/09/2023   History of Present Illness   The pt is a 48 yo male presenting 7/25 with L-sided weakness and slurred speech. Intubated for airway protection, pt given TNK, and is s/p revascularization of R MCA with IR 7/25. PMH includes: CHF, cardiomyopathy, ESRD on HD, HTN, and obesity (BMI 41).     Clinical Impressions Pt ind at baseline with ADLs/functional mobility, lives alone in a 2nd floor apt. Pt currently intubated, nodding yes/no fairly consistently to questions, and following commands with RUE/RLE/LLE. Pt needing mod-total A for ADLs, total +2 for bed mobility. Pt keeping eyes closed throughout session. SBP remaining in 130s. Pt presenting with impairments listed below, will follow acutely. Patient will benefit from intensive inpatient follow-up therapy, >3 hours/day to maximize safety/ind with ADL/functional mobility.      If plan is discharge home, recommend the following:   Two people to help with walking and/or transfers;Two people to help with bathing/dressing/bathroom;Assistance with cooking/housework;Assistance with feeding;Direct supervision/assist for medications management;Direct supervision/assist for financial management;Assist for transportation;Help with stairs or ramp for entrance;Supervision due to cognitive status     Functional Status Assessment   Patient has had a recent decline in their functional status and demonstrates the ability to make significant improvements in function in a reasonable and predictable amount of time.     Equipment Recommendations   Other (comment) (defer)     Recommendations for Other Services   PT consult;Rehab consult     Precautions/Restrictions   Precautions Precautions: Fall Recall of Precautions/Restrictions: Impaired Precaution/Restrictions Comments: ETT, foley, watch SpO2 and  RR Restrictions Weight Bearing Restrictions Per Provider Order: No     Mobility Bed Mobility Overal bed mobility: Needs Assistance Bed Mobility: Supine to Sit     Supine to sit: Total assist, +2 for physical assistance, +2 for safety/equipment, HOB elevated     General bed mobility comments: dependent on totalA of 2 to reposition and trunk support from bed to sit in chair position. SpO2 to 91% and RR to 30 with repositioning    Transfers                   General transfer comment: deferred due to lethargy and desat with repositioning      Balance                                           ADL either performed or assessed with clinical judgement   ADL Overall ADL's : Needs assistance/impaired Eating/Feeding: NPO   Grooming: Moderate assistance;Bed level   Upper Body Bathing: Maximal assistance   Lower Body Bathing: Total assistance   Upper Body Dressing : Maximal assistance   Lower Body Dressing: Total assistance   Toilet Transfer: Maximal assistance;+2 for physical assistance;Total assistance           Functional mobility during ADLs: Maximal assistance;+2 for physical assistance;Total assistance       Vision   Additional Comments: eyes closed throughout, therapist elevated pt's eyelids, pt resisiting with R eye attempting to close eyelid     Perception Perception: Impaired Preception Impairment Details: Inattention/Neglect Perception-Other Comments: L inattention, R head turn   Praxis         Pertinent Vitals/Pain Pain Assessment Pain Assessment: No/denies pain     Extremity/Trunk Assessment Upper Extremity Assessment Upper Extremity Assessment:  LUE deficits/detail LUE Deficits / Details: no AROM, PROM WFL, impaired sensation, no reaction/withdrawal to pain/pinch or nailbed pressure LUE Sensation: decreased light touch;decreased proprioception LUE Coordination: decreased fine motor;decreased gross motor   Lower  Extremity Assessment Lower Extremity Assessment: Defer to PT evaluation LLE Deficits / Details: grossly 4-/5 to MMT, pt reports sensation intact LLE Sensation: WNL LLE Coordination: decreased fine motor;decreased gross motor   Cervical / Trunk Assessment Cervical / Trunk Assessment: Other exceptions Cervical / Trunk Exceptions: large body habitus   Communication Communication Communication: Impaired Factors Affecting Communication: Trach/intubated   Cognition Arousal: Lethargic Behavior During Therapy: Flat affect Cognition: Difficult to assess Difficult to assess due to: Impaired communication, Intubated           OT - Cognition Comments: nods yes/no consistently, follows commands with R side -- keeps eyes closed                 Following commands: Impaired Following commands impaired: Follows one step commands inconsistently, Follows one step commands with increased time     Cueing  General Comments   Cueing Techniques: Verbal cues  FiO2 50%, PEEP 5. RR 14-30, SpO2 91-94%, BP 131/81 (95)   Exercises     Shoulder Instructions      Home Living Family/patient expects to be discharged to:: Private residence Living Arrangements: Alone   Type of Home: Apartment Home Access: Stairs to enter Secretary/administrator of Steps: 1 flight Entrance Stairs-Rails: Right;Left Home Layout: One level               Home Equipment: None          Prior Functioning/Environment Prior Level of Function : Independent/Modified Independent;Driving             Mobility Comments: no use of DME ADLs Comments: independent, driving himself to HD    OT Problem List: Decreased strength;Decreased range of motion;Decreased activity tolerance;Impaired balance (sitting and/or standing);Decreased safety awareness;Decreased coordination;Impaired vision/perception;Decreased cognition;Cardiopulmonary status limiting activity;Impaired sensation;Impaired tone;Impaired UE  functional use   OT Treatment/Interventions: Self-care/ADL training;Energy conservation;DME and/or AE instruction;Therapeutic activities;Patient/family education;Balance training;Cognitive remediation/compensation;Neuromuscular education;Therapeutic exercise;Manual therapy;Modalities;Splinting;Visual/perceptual remediation/compensation      OT Goals(Current goals can be found in the care plan section)   Acute Rehab OT Goals Patient Stated Goal: get ETT out OT Goal Formulation: With patient Time For Goal Achievement: 10/23/23 Potential to Achieve Goals: Good ADL Goals Pt Will Perform Grooming: with set-up;bed level;sitting Pt/caregiver will Perform Home Exercise Program: Increased ROM;Increased strength;Left upper extremity;With minimal assist;With written HEP provided Additional ADL Goal #1: pt will follow two step command with 75% accuracy in prep for ADLs Additional ADL Goal #2: pt will perform bed mobility mod A in prep for ADLs   OT Frequency:  Min 2X/week    Co-evaluation PT/OT/SLP Co-Evaluation/Treatment: Yes Reason for Co-Treatment: Complexity of the patient's impairments (multi-system involvement);Necessary to address cognition/behavior during functional activity;For patient/therapist safety PT goals addressed during session: Mobility/safety with mobility;Strengthening/ROM OT goals addressed during session: ADL's and self-care;Strengthening/ROM      AM-PAC OT 6 Clicks Daily Activity     Outcome Measure Help from another person eating meals?: Total Help from another person taking care of personal grooming?: A Lot Help from another person toileting, which includes using toliet, bedpan, or urinal?: Total Help from another person bathing (including washing, rinsing, drying)?: A Lot Help from another person to put on and taking off regular upper body clothing?: A Lot Help from another person to put on and taking off regular lower body  clothing?: Total 6 Click Score: 9    End of Session Nurse Communication: Mobility status  Activity Tolerance: Patient limited by lethargy;Patient limited by fatigue Patient left: in bed;with call bell/phone within reach;with bed alarm set;Other (comment) (R mitt reapplied)  OT Visit Diagnosis: Unsteadiness on feet (R26.81);Other abnormalities of gait and mobility (R26.89);Muscle weakness (generalized) (M62.81)                Time: 8856-8795 OT Time Calculation (min): 21 min Charges:  OT General Charges $OT Visit: 1 Visit OT Evaluation $OT Eval Moderate Complexity: 1 Mod  Henchy Mccauley K, OTD, OTR/L SecureChat Preferred Acute Rehab [phone]   Laneta POUR Koonce 10/09/2023, 12:59 PM

## 2023-10-09 NOTE — Progress Notes (Signed)
 [facility] Progress Note  Subjective:  Seen in ICU  Vitals:   10/09/23 0700 10/09/23 0750 10/09/23 0816 10/09/23 0903  BP: 135/85  138/84   Pulse: 79  81   Resp: (!) 22  (!) 27   Temp: 99.1 F (37.3 C)  99.5 F (37.5 C)   TempSrc:      SpO2: 95% 100% 92% 93%  Weight:        Exam: Gen on vent, sedated No rash, cyanosis or gangrene Sclera anicteric, throat w/ ETT No jvd or bruits Chest clear anterior/ lateral RRR no MRG Abd soft ntnd no mass or ascites +bs Ext no LE edema Neuro is on vent, sedated     AVF+bruit    Home bp meds: Norvasc  10 every day Coreg  25 bid Torsemide  60mg  bid    OP HD: SW TTS 4h   B500   138kg   2K bath AVF   Heparin  4000+ Wt gains 3.5- 5 kg on average Usually comes off 2-4kg over Last OP HD 7/24, post wt 142kg     Assessment/ Plan: Acute CVA: w/ L sided weakness. S/P TNK 7/25. Also went to NIR. Intubated in ICU. Per CCM.   ESRD: on HD TTS. Had full HD Thursday. Plan HD today/ tonight.  HTN: per CCM Volume: usual is 2-3 kg over post HD. UF same w/ HD today Anemia of esrd: Hb 11- 12, no esa needs.      Myer Fret MD  [facility] 10/08/2023, 10:54 AM  Recent Labs  Lab 10/07/23 0851 10/07/23 1032 10/07/23 1551 10/08/23 0505 10/09/23 0610  HGB 11.2*   < > 9.5* 9.8*  --   ALBUMIN 3.7  --   --  3.1*  --   CALCIUM  9.8  --   --  8.6* 9.6  PHOS  --   --   --   --  8.8*  CREATININE 10.84*  --   --  12.81* 11.53*  K 4.3   < > 4.3 4.6 4.7   < > = values in this interval not displayed.   No results for input(s): IRON , TIBC, FERRITIN in the last 168 hours. Inpatient medications:  amLODipine   5 mg Per Tube Daily   aspirin   300 mg Rectal Daily   Or   aspirin   81 mg Per Tube Daily   carvedilol   6.25 mg Per Tube BID WC   Chlorhexidine  Gluconate Cloth  6 each Topical Daily   Chlorhexidine  Gluconate Cloth  6 each Topical Q0600   heparin  injection (subcutaneous)  5,000 Units Subcutaneous Q8H   insulin  aspart   0-6 Units Subcutaneous Q4H   labetalol   20 mg Intravenous Once   mouth rinse  15 mL Mouth Rinse Q2H   pantoprazole  (PROTONIX ) IV  40 mg Intravenous QHS    dexmedetomidine  (PRECEDEX ) IV infusion 0.6 mcg/kg/hr (10/09/23 9361)   fentaNYL  infusion INTRAVENOUS Stopped (10/09/23 0028)   acetaminophen  **OR** acetaminophen  (TYLENOL ) oral liquid 160 mg/5 mL **OR** acetaminophen , fentaNYL , ondansetron  (ZOFRAN ) IV, mouth rinse, senna-docusate

## 2023-10-09 NOTE — Progress Notes (Signed)
 [facility] Progress Note  Subjective:  Seen in ICU Requiring more FiO2 this am Had HD yest w/ 3 L off, got down to dry wt  New CXR w/ RLL infiltrate   Vitals:   10/09/23 1551 10/09/23 1600 10/09/23 1700 10/09/23 1800  BP:  137/84 137/85 (!) 140/86  Pulse: 76 76 73 71  Resp: (!) 32 (!) 33 (!) 35 (!) 35  Temp: 99.7 F (37.6 C) 99.7 F (37.6 C) 99.5 F (37.5 C) 99.3 F (37.4 C)  TempSrc:      SpO2: 93% 95% 93% 94%  Weight:        Exam: Gen on vent, sedated No rash, cyanosis or gangrene Sclera anicteric, throat w/ ETT No jvd or bruits Chest clear anterior/ lateral RRR no MRG Abd soft ntnd no mass or ascites +bs Ext no LE edema Neuro is on vent, sedated     AVF+bruit    Home bp meds: Norvasc  10 every day Coreg  25 bid Torsemide  60mg  bid    OP HD: SW TTS 4h   B500   138kg   2K bath AVF   Heparin  4000+ Wt gains 3.5- 5 kg on average Usually comes off 2-4kg over Last OP HD 7/24, post wt 142kg     Assessment/ Plan: Acute CVA: w/ L sided weakness. S/P TNK 7/25. Also went to NIR. Intubated in ICU. Per CCM.   ESRD: on HD TTS. Had HD last night in ICU. Will plan HD again tonight for possible fluid overload causing ^FiO2 needs.  HTN: per CCM Volume: close to dry wt, UF 2-3 L w/ next HD Anemia of esrd: Hb 11- 12, no esa needs.      Myer Fret MD  [facility] 10/08/2023, 10:54 AM  Recent Labs  Lab 10/07/23 0851 10/07/23 1032 10/07/23 1551 10/08/23 0505 10/09/23 0610  HGB 11.2*   < > 9.5* 9.8*  --   ALBUMIN 3.7  --   --  3.1*  --   CALCIUM  9.8  --   --  8.6* 9.6  PHOS  --   --   --   --  8.8*  CREATININE 10.84*  --   --  12.81* 11.53*  K 4.3   < > 4.3 4.6 4.7   < > = values in this interval not displayed.   No results for input(s): IRON , TIBC, FERRITIN in the last 168 hours. Inpatient medications:  amLODipine   5 mg Per Tube Daily   aspirin   300 mg Rectal Daily   Or   aspirin   81 mg Per Tube Daily   carvedilol   6.25 mg Per  Tube BID WC   Chlorhexidine  Gluconate Cloth  6 each Topical Daily   Chlorhexidine  Gluconate Cloth  6 each Topical Q0600   feeding supplement (VITAL HIGH PROTEIN)  1,000 mL Per Tube Q24H   heparin  injection (subcutaneous)  5,000 Units Subcutaneous Q8H   insulin  aspart  0-6 Units Subcutaneous Q4H   labetalol   20 mg Intravenous Once   mouth rinse  15 mL Mouth Rinse Q2H   pantoprazole  (PROTONIX ) IV  40 mg Intravenous QHS    dexmedetomidine  (PRECEDEX ) IV infusion 0.6 mcg/kg/hr (10/09/23 1550)   fentaNYL  infusion INTRAVENOUS Stopped (10/09/23 0028)   acetaminophen  **OR** acetaminophen  (TYLENOL ) oral liquid 160 mg/5 mL **OR** acetaminophen , fentaNYL , ondansetron  (ZOFRAN ) IV, mouth rinse, senna-docusate

## 2023-10-09 NOTE — Progress Notes (Signed)
 Inpatient Rehab Admissions Coordinator Note:   Per therapy patient was screened for CIR candidacy by Vega Stare SHAUNNA Yvone Cohens, CCC-SLP. At this time, pt is not medically ready for CIR. Pt is intubated. Will not pursue a rehab consult at this time. CIR admissions team will follow to monitor for medical readiness. A consult order will be placed if pt appears to be an appropriate candidate.   Tinnie Yvone Cohens, MS, CCC-SLP Admissions Coordinator [phone] 10/09/23 2:29 PM

## 2023-10-09 NOTE — Evaluation (Signed)
 Physical Therapy Evaluation Patient Details Name: Nathan Campbell MRN: [id number] DOB: 25-Dec-1975 Today's Date: 10/09/2023  History of Present Illness  The pt is a 48 yo male presenting 7/25 with L-sided weakness and slurred speech. Intubated for airway protection, pt given TNK, and is s/p revascularization of R MCA with IR 7/25. PMH includes: CHF, cardiomyopathy, ESRD on HD, HTN, and obesity (BMI 41).   Clinical Impression  Pt in bed upon arrival of PT, agreeable to evaluation at this time. Prior to admission the pt was independent with mobility without DME, reports able to drive himself to HD and manage independently. The pt presents today with deficits in strength (RLE slightly stronger than LLE), command following, coordination, and activity tolerance. The pt was able to attempt most commands with increased time, and was mostly consistent with yes/no via nods. However, full assessment limited due to pt intubated and no family present at time of eval. RR to max of 30 with repositioning and SpO2 to low of 91%. RN aware. Anticipate will need intensive therapies once better able to tolerate to allow for best functional recovery and return to full independence.     If plan is discharge home, recommend the following: Two people to help with walking and/or transfers;Two people to help with bathing/dressing/bathroom;Assistance with cooking/housework;Assistance with feeding;Direct supervision/assist for medications management;Direct supervision/assist for financial management;Assist for transportation;Help with stairs or ramp for entrance;Supervision due to cognitive status   Can travel by private vehicle        Equipment Recommendations Other (comment) (defer until gait training initiated)  Recommendations for Other Services  Rehab consult    Functional Status Assessment Patient has had a recent decline in their functional status and demonstrates the ability to make significant improvements in  function in a reasonable and predictable amount of time.     Precautions / Restrictions Precautions Precautions: Fall Recall of Precautions/Restrictions: Impaired Precaution/Restrictions Comments: ETT, foley, watch SpO2 and RR Restrictions Weight Bearing Restrictions Per Provider Order: No      Mobility  Bed Mobility Overal bed mobility: Needs Assistance Bed Mobility: Supine to Sit     Supine to sit: Total assist, +2 for physical assistance, +2 for safety/equipment, HOB elevated     General bed mobility comments: dependent on totalA of 2 to reposition and trunk support from bed to sit in chair position. SpO2 to 91% and RR to 30 with repositioning    Transfers                   General transfer comment: deferred due to lethargy and desat with repositioning      Modified Rankin (Stroke Patients Only) Modified Rankin (Stroke Patients Only) Pre-Morbid Rankin Score: No symptoms Modified Rankin: Severe disability        Pertinent Vitals/Pain Pain Assessment Pain Assessment: No/denies pain    Home Living Family/patient expects to be discharged to:: Private residence Living Arrangements: Alone   Type of Home: Apartment Home Access: Stairs to enter Entrance Stairs-Rails: Doctor, general practice of Steps: 1 flight   Home Layout: One level Home Equipment: None      Prior Function Prior Level of Function : Independent/Modified Independent;Driving             Mobility Comments: no use of DME ADLs Comments: independent, driving himself to HD     Extremity/Trunk Assessment   Upper Extremity Assessment Upper Extremity Assessment: Defer to OT evaluation    Lower Extremity Assessment Lower Extremity Assessment: Generalized weakness;LLE deficits/detail (grossly 4/5 to MMT  in RLE, pt reports sensation intact) LLE Deficits / Details: grossly 4-/5 to MMT, pt reports sensation intact LLE Sensation: WNL LLE Coordination: decreased fine  motor;decreased gross motor    Cervical / Trunk Assessment Cervical / Trunk Assessment: Other exceptions Cervical / Trunk Exceptions: large body habitus  Communication   Communication Communication: Impaired Factors Affecting Communication: Trach/intubated    Cognition Arousal: Lethargic Behavior During Therapy: Flat affect   PT - Cognitive impairments: Difficult to assess Difficult to assess due to: Intubated                     PT - Cognition Comments: pt mostly consistent with yes/no nodding. followed commands with increased time other than opening eyes. able to gesture that he would like ETT out. pt not oriented to place or situation but later able to recall once told Following commands: Impaired Following commands impaired: Follows one step commands inconsistently, Follows one step commands with increased time     Cueing Cueing Techniques: Verbal cues     General Comments General comments (skin integrity, edema, etc.): FiO2 50%, PEEP 5. RR 14-30, SpO2 91-94%    Exercises     Assessment/Plan    PT Assessment Patient needs continued PT services  PT Problem List Decreased strength;Decreased activity tolerance;Decreased balance;Decreased mobility;Decreased coordination;Decreased safety awareness;Impaired sensation;Obesity       PT Treatment Interventions DME instruction;Gait training;Stair training;Functional mobility training;Therapeutic activities;Therapeutic exercise;Balance training;Neuromuscular re-education;Cognitive remediation;Patient/family education    PT Goals (Current goals can be found in the Care Plan section)  Acute Rehab PT Goals Patient Stated Goal: to get extubated PT Goal Formulation: With patient Time For Goal Achievement: 10/23/23 Potential to Achieve Goals: Good    Frequency Min 3X/week     Co-evaluation PT/OT/SLP Co-Evaluation/Treatment: Yes Reason for Co-Treatment: Complexity of the patient's impairments (multi-system  involvement);Necessary to address cognition/behavior during functional activity;For patient/therapist safety PT goals addressed during session: Mobility/safety with mobility;Strengthening/ROM         AM-PAC PT 6 Clicks Mobility  Outcome Measure Help needed turning from your back to your side while in a flat bed without using bedrails?: Total Help needed moving from lying on your back to sitting on the side of a flat bed without using bedrails?: Total Help needed moving to and from a bed to a chair (including a wheelchair)?: Total Help needed standing up from a chair using your arms (e.g., wheelchair or bedside chair)?: Total Help needed to walk in hospital room?: Total Help needed climbing 3-5 steps with a railing? : Total 6 Click Score: 6    End of Session Equipment Utilized During Treatment: Oxygen Activity Tolerance: Patient limited by fatigue;Patient limited by lethargy Patient left: in bed;with call bell/phone within reach;with bed alarm set;with restraints reapplied (mitt R hand) Nurse Communication: Mobility status PT Visit Diagnosis: Unsteadiness on feet (R26.81);Other abnormalities of gait and mobility (R26.89);Muscle weakness (generalized) (M62.81);Hemiplegia and hemiparesis Hemiplegia - Right/Left: Left Hemiplegia - dominant/non-dominant: Non-dominant Hemiplegia - caused by: Cerebral infarction    Time: 1143-1205 PT Time Calculation (min) (ACUTE ONLY): 22 min   Charges:   PT Evaluation $PT Eval High Complexity: 1 High   PT General Charges $$ ACUTE PT VISIT: 1 Visit         Izetta Call, PT, DPT   Acute Rehabilitation Department Office [phone] Secure Chat Communication Preferred  Izetta JULIANNA Call 10/09/2023, 12:47 PM

## 2023-10-09 NOTE — Progress Notes (Signed)
 Pt transported from 4N22 to CT and back on vent without complications

## 2023-10-09 NOTE — Progress Notes (Addendum)
 STROKE TEAM PROGRESS NOTE   INTERIM HISTORY/SUBJECTIVE Remains intubated. Plan for extubation later today if he continues to wean well. Following commands.  CT head this morning shows stable appearance of the right posterior region MCA infarct with mild cytotoxic edema but no significant midline shift or hemorrhagic transformation.  He is weaning this morning of ventilatory support and is on minimal sedation and easily arousable and following commands on the right. OBJECTIVE  CBC    Component Value Date/Time   WBC 8.6 10/08/2023 0505   RBC 3.02 (L) 10/08/2023 0505   HGB 9.8 (L) 10/08/2023 0505   HGB 9.2 (L) 01/22/2021 1235   HCT 29.8 (L) 10/08/2023 0505   HCT 28.9 (L) 01/22/2021 1235   PLT 203 10/08/2023 0505   PLT 208 01/22/2021 1235   MCV 98.7 10/08/2023 0505   MCV 93 01/22/2021 1235   MCH 32.5 10/08/2023 0505   MCHC 32.9 10/08/2023 0505   RDW 13.1 10/08/2023 0505   RDW 12.0 01/22/2021 1235   LYMPHSABS 1.0 10/08/2023 0505   LYMPHSABS 0.8 01/22/2021 1235   MONOABS 1.2 (H) 10/08/2023 0505   EOSABS 0.2 10/08/2023 0505   EOSABS 0.1 01/22/2021 1235   BASOSABS 0.0 10/08/2023 0505   BASOSABS 0.0 01/22/2021 1235    BMET    Component Value Date/Time   NA 136 10/09/2023 0610   K 4.7 10/09/2023 0610   CL 94 (L) 10/09/2023 0610   CO2 24 10/09/2023 0610   GLUCOSE 97 10/09/2023 0610   BUN 53 (H) 10/09/2023 0610   CREATININE 11.53 (H) 10/09/2023 0610   CALCIUM  9.6 10/09/2023 0610   GFRNONAA 5 (L) 10/09/2023 0610    IMAGING past 24 hours CT HEAD WO CONTRAST ( ) Result Date: 10/09/2023 EXAM: CT HEAD WITHOUT CONTRAST 10/09/2023 08:06:27 AM TECHNIQUE: CT of the head was performed without the administration of intravenous contrast. Automated exposure control, iterative reconstruction, and/or weight based adjustment of the mA/kV was utilized to reduce the radiation dose to as low as reasonably achievable. COMPARISON: None available. CLINICAL HISTORY: Stroke, follow up. Non con.  Principal Problem: Acute ischemic stroke (HCC). Chief complaints: Code Stroke. FINDINGS: BRAIN AND VENTRICLES: Expected evolution of the large right MCA territory infarct is noted. Effacement of the sulci is present. No significant midline shift is present. Hyperdense thrombus is present within sylvian branches of the right MCA. No acute left-sided abnormality is present. ORBITS: No acute abnormality. SINUSES: No acute abnormality. SOFT TISSUES AND SKULL: No acute soft tissue abnormality. No skull fracture. IMPRESSION: 1. Expected evolution of the large right MCA territory infarct with effacement of the sulci and hyperdense thrombus within sylvian branches of the right MCA. No significant midline shift. 2. No acute left-sided abnormality. Electronically signed by: Lonni Necessary MD 10/09/2023 08:29 AM EDT RP Workstation: [id number]   MR BRAIN WO CONTRAST Result Date: 10/08/2023 EXAM: MRI BRAIN WITHOUT CONTRAST 10/08/2023 12:38:36 PM TECHNIQUE: Multiplanar multisequence MRI of the head/brain was performed without the administration of intravenous contrast. COMPARISON: CT head without contrast 10/07/2023. CLINICAL HISTORY: Stroke, follow up. FINDINGS: BRAIN AND VENTRICLES: A large right MCA (middle cerebral artery) territory infarct is present. The right caudate head and lentiform nucleus are infarcted. Insular ribbon and ganglionic and supraganglionic cortical infarcts are noted. A focal area of cortical infarction is present in the medial anterior right frontal lobe. No focal hemorrhage is present. Diffuse T2 and FLAIR hyperintensities are present throughout the infarcted area. Additional periventricular T2 hyperintensities are moderately advanced for age. Diffuse cortical thickening is present with some  mass effect but no significant midline shift. ORBITS: No acute abnormality. SINUSES AND MASTOIDS: Fluid levels are present in the posterior ethmoid air cells, the right maxillary sinus and bilateral sphenoid  sinuses. Fluid is present in the nasopharynx. BONES AND SOFT TISSUES: Normal marrow signal. No acute soft tissue abnormality. IMPRESSION: 1. Large right MCA territory infarct with associated diffuse cortical thickening and some mass effect, but no significant midline shift. No focal hemorrhage. 2. Moderately advanced periventricular T2 hyperintensities for age. This likely reflects sequela of chronic microvascular ischemia. 3. Diffuse sinus disease likely related to intubated status. Electronically signed by: Lonni Necessary MD 10/08/2023 12:59 PM EDT RP Workstation: [id number]   MR ANGIO HEAD WO CONTRAST Result Date: 10/08/2023 EXAM: MR Angiography Head without intravenous Contrast. 10/08/2023 12:38:20 PM TECHNIQUE: Magnetic resonance angiography images of the head without intravenous contrast. Multiplanar 2D and 3D reformatted images are provided for review. COMPARISON: Cerebral arteriogram 10/07/2023. CLINICAL HISTORY: Stroke, follow up. Interval revascularization of the right middle cerebral artery. FINDINGS: ANTERIOR CIRCULATION: No significant stenosis of the internal carotid arteries. No significant stenosis of the anterior cerebral arteries. A distal right M2 occlusion is somewhat more proximal than on the final angio images. No new occlusion is present. No aneurysm. POSTERIOR CIRCULATION: No significant stenosis of the posterior cerebral arteries. No significant stenosis of the basilar artery. No significant stenosis of the vertebral arteries. No aneurysm. IMPRESSION: 1. Distal right M2 occlusion, somewhat more proximal than on the final angio images. No new occlusion is present. Electronically signed by: Lonni Necessary MD 10/08/2023 12:55 PM EDT RP Workstation: [id number]    Vitals:   10/09/23 0600 10/09/23 0700 10/09/23 0750 10/09/23 0816  BP: 139/82 135/85  138/84  Pulse: 75 79  81  Resp: (!) 22 (!) 22  (!) 27  Temp: 98.8 F (37.1 C) 99.1 F (37.3 C)  99.5 F (37.5 C)  TempSrc:       SpO2: 96% 95% 100% 92%  Weight:         PHYSICAL EXAM General:  Sedated CV: Regular rate and rhythm on monitor Respiratory: Mechanically ventilated GI: Abdomen soft and nontender   NEURO:  Mental Status: Sedated, intubated, no verbal output.  Is able to follow one and two step commands Speech/Language:Following commands, sedated  Cranial Nerves:  II:  PERRL III, IV, VI: EOMI. Eyelids elevate symmetrically.  VII: Obscured by ETT VIII: hearing intact to voice. IX, X: Cough and gag intact KP:Yzji midline  XII: tongue is midline without fasciculations. Motor:  Moves right upper and lower extremity against gravity purposefully.  Able to move left lower extremity slightly.  Left upper extremity appears plegic and flaccid   RUE withdraws to pain Tone: is normal and bulk is normal Sensation- Withdraws in all extremities   Coordination: Unable to complete Gait- deferred   ASSESSMENT/PLAN  Nathan Campbell is a 48 y.o. male with history of obesity, hypertension, end-stage renal disease on HD TTS, combined systolic and diastolic heart failure who presented to the emergency department on 7/25 with left-sided weakness, slurred speech.  NIH on Admission 13. IV TNK after CT head showed what looked like a calcific embolus in the right MCA and a good ASPECT  score and CT angiogram could not be obtained with emergent cerebral catheter angiogram showed right M1 occlusion and he underwent  TICI 2 b revascularization of the superior and inferior division to the M2 region but was left intubated for medical issues given history of CHF and renal failure   Acute  Ischemic Infarct:  right MCA territory infarct  s/p mechanical thrombectomy and TNK Etiology: Embolic versus large vessel disease Code Stroke CT head - Findings consistent with acute right MCA territory ischemic changes, ASPECT score 7. And calcific density is noted in the proximal right M1 segment. This is most concerning for a calcific  embolus. Intracranial atherosclerotic changes considered less likely. MRI   Large right MCA territory infarct with associated diffuse cortical thickening and some mass effect MRA  Distal right M2 occlusion, somewhat more proximal than on the final angio images. No new occlusion is present. Repeat CT Head-  Expected evolution of the large right MCA territory infarct with effacement of the sulci and hyperdense thrombus within sylvian branches of the right MCA. No significant midline shift. 2D Echo LA mildly dilated, right atria fairly dilated.  EF 50-55% LDL 85 HgbA1c 5.1 VTE prophylaxis -heparin  No antithrombotic prior to admission, now on aspirin  81 mg daily and aspirin  300 mg suppository daily  Therapy recommendations:  Pending Disposition:  Neuro ICU  S/p thrombectomy of R M1 with TICI 2B revascularization  Intubated prior to procedure due to agitation   Acute hypoxic and hypercapnic respiratory failure Extubate per CCM SBT/ VAP protocol  Precedex   Combined systolic and diastolic heart failure Hypertension Pulmonary hypertension Home meds: Amlodipine , isosorbide  mononitrate, furosemide , carvedilol  Resume GDMT when BP stabilized Stable Blood Pressure Goal: BP less than 180/105   Hyperlipidemia LDL 85 goal < 70 Add atorvastatin Continue statin at discharge  Elevated triglycerides [phone] Switch propofol  to Precedex   Dysphagia NPO Start TF if unable to extubate  ESRD on HD Nephrology consulted HD 7/26   Hospital day # 2  Patient seen and examined by NP/APP with MD. MD to update note as needed.   Jorene Last, DNP, FNP-BC [facility] Pager: [phone]  I have personally obtained history,examined this patient, reviewed notes, independently viewed imaging studies, participated in medical decision making and plan of care.ROS completed by me personally and pertinent positives fully documented  I have made any additions or clarifications directly to  the above note. Agree with note above.  Patient remains lightly sedated and intubated but is arousable and following commands and moving right side as well as well as moving left leg also.  He remains weak in the left arm.  Repeat CT scan shows stable appearance of the right MCA infarct with mild cytotoxic edema but no significant midline shift or hemorrhagic transformation.  Start aspirin  for stroke prevention.  Wean off ventilatory support.  Extubate as tolerated per critical care team.  Continue telemetry monitoring.  Likely need prolonged cardiac monitoring at discharge for paroxysmal A-fib.  No family available at the bedside.  Discussed with critical care team. This patient is critically ill and at significant risk of neurological worsening, death and care requires constant monitoring of vital signs, hemodynamics,respiratory and cardiac monitoring, extensive review of multiple databases, frequent neurological assessment, discussion with family, other specialists and medical decision making of high complexity.I have made any additions or clarifications directly to the above note.This critical care time does not reflect procedure time, or teaching time or supervisory time of PA/NP/Med Resident etc but could involve care discussion time.  I spent 30 minutes of neurocritical care time  in the care of  this patient.     Eather Popp, MD Medical Director [facility] Pager: [phone] 10/09/2023 2:11 PM   To contact Stroke Continuity provider, please refer to [email or web address]. After hours, contact General Neurology

## 2023-10-09 NOTE — Progress Notes (Signed)
 OT Cancellation Note  Patient Details Name: Nathan Campbell MRN: [id number] DOB: [birth year]   Cancelled Treatment:    Reason Eval/Treat Not Completed: Active bedrest order  Dorreen Valiente K, OTD, OTR/L SecureChat Preferred Acute Rehab [phone]   [name] 10/09/2023, 6:37 AM

## 2023-10-09 NOTE — Progress Notes (Signed)
 NAME:  Nathan Campbell, MRN:  [id number], DOB:  12-Sep-1975, LOS: 2 ADMISSION DATE:  10/07/2023, CONSULTATION DATE:  7/25 REFERRING MD: Stroke, CHIEF COMPLAINT: Stroke  History of Present Illness:  48 year old male with past medical history of obesity, hypertension, end-stage renal disease on HD TTS, combined systolic and diastolic heart failure who presented to the emergency department on 7/25 with left-sided weakness, slurred speech.  LKW 0630.  Presented as a code stroke and was greeted by neurology at the bridge. CT head demonstrated acute right MCA infarct, ASPECTS 7.  Additionally he has calcified density along the proximal right M1 segment concerning for calcific embolus. CTA unable to be obtained for restlessness and concern over his airway so he was intubated. Received TNK @ [id number]. Taken to [facility] @ [phone] for angiogram and mechanical intervention.  Pertinent  Medical History  obesity, hypertension, end-stage renal disease on HD TTS, combined systolic and diastolic heart failure  Significant Hospital Events: Including procedures, antibiotic start and stop dates in addition to other pertinent events   7/25: Presented as a code stroke, admit , brought to ICU intubated, PEEP of 10 added  Interim History / Subjective:   Critically ill, intubated, Sedated on Precedex  and fentanyl  , off Levophed  Down to 60%/PEEP of 10 Underwent hemodialysis yesterday Low-grade febrile 100.4  Objective   Blood pressure 138/84, pulse 81, temperature 99.5 F (37.5 C), resp. rate (!) 27, weight (!) 138 kg, SpO2 92%.    Vent Mode: CPAP;PSV FiO2 (%):  [50 %-60 %] 50 % Set Rate:  [22 bmp] 22 bmp Vt Set:  [379 mL] 620 mL PEEP:  [10 cmH20] 10 cmH20 Pressure Support:  [10 cmH20] 10 cmH20 Plateau Pressure:  [22 cmH20-26 cmH20] 26 cmH20   Intake/Output Summary (Last 24 hours) at 10/09/2023 0824 Last data filed at 10/09/2023 0600 Gross per 24 hour  Intake 516.02 ml  Output 3080 ml  Net -2563.98 ml   Filed  Weights   10/08/23 0500 10/08/23 1524 10/08/23 1853  Weight: (!) 141.6 kg (!) 142.1 kg (!) 138 kg    Examination: General: middle aged morbidly obese man, intubated HENT: pupils pinpoint, non reactive; ncat, ett, ogt Lungs: Decreased breath sounds bilateral, no accessory muscle use Cardiovascular: S1-S2 regular, no murmur Abdomen: Obese, soft Extremities: no pitting edema Neuro: sedate , RASS -2 GU: foley  Labs show normal electrolytes  Chest x-ray 7/26 shows vascular congestion, left basilar atelectasis  Resolved Hospital Problem list    Assessment & Plan:  Acute right MCA infarct s/p TNK and NIR mechanical thrombectomy  Left sided weakness and slurred speech. CT w/ acute right MCA infarct s/p TNK and TICI 2b revascularization.  MRI 7/26 shows large right MCA infarct with some mass effect - stroke team primary - Frequent neuro checks -Aspirin  daily - Neuroprotective measures: HOB > 30 degrees, normoglycemia, normothermia, electrolytes WNL -Switch to Precedex  from propofol , goal RASS 0 to -1  Acute hypoxic and hypercapneic respiratory failure 2/2 above  Intubated to facilitate CT, concern for airway protection in setting of acute stroke.  Persistent hypoxia may be due to pulm hypertension or due to fluid overload, doubt aspiration , improved with HD - daily SAT/SBT , try to wean to extubation today - lung protective ventilation 6-8cc/kg Vt - VAP and PAD bundle in place  - titrate FiO2 to sat goal >92  - maintain peak/plats <30, driving pressures <84    Hypertension Home meds amlodipine , carvedilol , isosorbide  mononitrate  - Off Cleviprex  - SBP Goal 120-140  -  Off Levophed , resume amlodipine  and carvedilol  at half doses   ESRD on HD TTS Anemia of chronic disease Last dialysis 7/26.  - Renal assisting - strict I&O - Avoid nephrotoxic agents, renally dose medications - No response to diuretic, will hold   Combined systolic and diastolic heart failure Echo shows  enlarged RA/RV, nondiagnostic bubble study  , worsening of pre-existing secondary pulm hypertension -on amlodipine , carvedilol , isosorbide  mononitrate, torsemide  60 BID  - GDMT when blood pressure improved - tele monitoring   Best Practice (right click and Reselect all SmartList Selections daily)   Diet/type: NPO start tube feeds if remains intubated today DVT prophylaxis: SCD GI prophylaxis: PPI Lines: Arterial Line and yes and it is still needed Foley:  Yes, and it is still needed Code Status:  full code Last date of multidisciplinary goals of care discussion [per primary]  Labs   CBC: Recent Labs  Lab 10/07/23 0849 10/07/23 0851 10/07/23 1032 10/07/23 1551 10/08/23 0505  WBC  --  7.9  --   --  8.6  NEUTROABS  --  5.1  --   --  6.2  HGB 11.6* 11.2* 10.5* 9.5* 9.8*  HCT 34.0* 34.7* 31.0* 28.0* 29.8*  MCV  --  99.4  --   --  98.7  PLT  --  229  --   --  203    Basic Metabolic Panel: Recent Labs  Lab 10/07/23 0849 10/07/23 0851 10/07/23 1032 10/07/23 1551 10/08/23 0505 10/09/23 0610  NA 140 140 140 139 135 136  K 4.3 4.3 4.6 4.3 4.6 4.7  CL 101 98  --   --  99 94*  CO2  --  24  --   --  22 24  GLUCOSE 153* 152*  --   --  84 97  BUN 57* 62*  --   --  72* 53*  CREATININE 12.00* 10.84*  --   --  12.81* 11.53*  CALCIUM   --  9.8  --   --  8.6* 9.6  MG  --   --   --   --   --  2.1  PHOS  --   --   --   --   --  8.8*   GFR: Estimated Creatinine Clearance: 11.6 mL/min (A) (by C-G formula based on SCr of 11.53 mg/dL (H)). Recent Labs  Lab 10/07/23 0851 10/08/23 0505  WBC 7.9 8.6    Liver Function Tests: Recent Labs  Lab 10/07/23 0851 10/08/23 0505  AST 15 10*  ALT 13 11  ALKPHOS 82 66  BILITOT 0.6 0.8  PROT 7.2 6.1*  ALBUMIN 3.7 3.1*   No results for input(s): LIPASE, AMYLASE in the last 168 hours. No results for input(s): AMMONIA in the last 168 hours.  ABG    Component Value Date/Time   PHART 7.424 10/07/2023 1551   PCO2ART 38.2  10/07/2023 1551   PO2ART 70 (L) 10/07/2023 1551   HCO3 25.0 10/07/2023 1551   TCO2 26 10/07/2023 1551   O2SAT 94 10/07/2023 1551     Coagulation Profile: Recent Labs  Lab 10/07/23 0851  INR 1.0    Cardiac Enzymes: No results for input(s): CKTOTAL, CKMB, CKMBINDEX, TROPONINI in the last 168 hours.  HbA1C: Hgb A1c MFr Bld  Date/Time Value Ref Range Status  10/08/2023 05:05 AM 5.1 4.8 - 5.6 % Final    Comment:    (NOTE) Diagnosis of Diabetes The following HbA1c ranges recommended by the American Diabetes Association (ADA) may be used  as an aid in the diagnosis of diabetes mellitus.  Hemoglobin             Suggested A1C NGSP%              Diagnosis  <5.7                   Non Diabetic  5.7-6.4                Pre-Diabetic  >6.4                   Diabetic  <7.0                   Glycemic control for                       adults with diabetes.    06/11/2016 10:57 AM 4.7 (L) 4.8 - 5.6 % Final    Comment:    (NOTE)         Pre-diabetes: 5.7 - 6.4         Diabetes: >6.4         Glycemic control for adults with diabetes: <7.0     CBG: Recent Labs  Lab 10/08/23 1550 10/08/23 1930 10/08/23 2323 10/09/23 0326 10/09/23 0822  GLUCAP 93 93 106* 92 98    Critical care time: 34 m   Jera Headings V. [name], MD [facility] 10/09/23 8:24 AM  Please see [email or web address] for pager details.  From 7A-7P if no response, please call [phone] After hours, please call ELink [phone]

## 2023-10-09 NOTE — Progress Notes (Signed)
 PT Cancellation Note  Patient Details Name: Nathan Campbell MRN: [id number] DOB: 1976-02-26   Cancelled Treatment:    Reason Eval/Treat Not Completed: Active bedrest order remains this morning, PT will continue to follow and evaluate when appropriate.   Izetta Call, PT, DPT   Acute Rehabilitation Department Office [phone] Secure Chat Communication Preferred   Izetta JULIANNA Call 10/09/2023, 8:04 AM

## 2023-10-10 ENCOUNTER — Encounter (HOSPITAL_COMMUNITY): Payer: Self-pay | Admitting: Radiology

## 2023-10-10 DIAGNOSIS — I63311 Cerebral infarction due to thrombosis of right middle cerebral artery: Secondary | ICD-10-CM | POA: Diagnosis not present

## 2023-10-10 DIAGNOSIS — I779 Disorder of arteries and arterioles, unspecified: Secondary | ICD-10-CM | POA: Diagnosis not present

## 2023-10-10 DIAGNOSIS — J9601 Acute respiratory failure with hypoxia: Secondary | ICD-10-CM | POA: Diagnosis not present

## 2023-10-10 DIAGNOSIS — I10 Essential (primary) hypertension: Secondary | ICD-10-CM | POA: Diagnosis not present

## 2023-10-10 DIAGNOSIS — I63411 Cerebral infarction due to embolism of right middle cerebral artery: Secondary | ICD-10-CM | POA: Diagnosis not present

## 2023-10-10 DIAGNOSIS — J9602 Acute respiratory failure with hypercapnia: Secondary | ICD-10-CM | POA: Diagnosis not present

## 2023-10-10 DIAGNOSIS — J690 Pneumonitis due to inhalation of food and vomit: Secondary | ICD-10-CM

## 2023-10-10 LAB — CBC WITH DIFFERENTIAL/PLATELET
Abs Immature Granulocytes: 0.1 K/uL — ABNORMAL HIGH (ref 0.00–0.07)
Basophils Absolute: 0 K/uL (ref 0.0–0.1)
Basophils Relative: 0 %
Eosinophils Absolute: 0.3 K/uL (ref 0.0–0.5)
Eosinophils Relative: 3 %
HCT: 31.8 % — ABNORMAL LOW (ref 39.0–52.0)
Hemoglobin: 10.6 g/dL — ABNORMAL LOW (ref 13.0–17.0)
Immature Granulocytes: 1 %
Lymphocytes Relative: 9 %
Lymphs Abs: 1.1 K/uL (ref 0.7–4.0)
MCH: 32.2 pg (ref 26.0–34.0)
MCHC: 33.3 g/dL (ref 30.0–36.0)
MCV: 96.7 fL (ref 80.0–100.0)
Monocytes Absolute: 1.6 K/uL — ABNORMAL HIGH (ref 0.1–1.0)
Monocytes Relative: 14 %
Neutro Abs: 8.5 K/uL — ABNORMAL HIGH (ref 1.7–7.7)
Neutrophils Relative %: 73 %
Platelets: 236 K/uL (ref 150–400)
RBC: 3.29 MIL/uL — ABNORMAL LOW (ref 4.22–5.81)
RDW: 12.7 % (ref 11.5–15.5)
WBC: 11.6 K/uL — ABNORMAL HIGH (ref 4.0–10.5)
nRBC: 0 % (ref 0.0–0.2)

## 2023-10-10 LAB — BASIC METABOLIC PANEL WITH GFR
Anion gap: 16 — ABNORMAL HIGH (ref 5–15)
BUN: 37 mg/dL — ABNORMAL HIGH (ref 6–20)
CO2: 24 mmol/L (ref 22–32)
Calcium: 9.6 mg/dL (ref 8.9–10.3)
Chloride: 92 mmol/L — ABNORMAL LOW (ref 98–111)
Creatinine, Ser: 7.69 mg/dL — ABNORMAL HIGH (ref 0.61–1.24)
GFR, Estimated: 8 mL/min — ABNORMAL LOW (ref >60)
Glucose, Bld: 108 mg/dL — ABNORMAL HIGH (ref 70–99)
Potassium: 4 mmol/L (ref 3.5–5.1)
Sodium: 132 mmol/L — ABNORMAL LOW (ref 135–145)

## 2023-10-10 LAB — CULTURE, RESPIRATORY W GRAM STAIN: Culture: NORMAL

## 2023-10-10 LAB — GLUCOSE, CAPILLARY
Glucose-Capillary: 118 mg/dL — ABNORMAL HIGH (ref 70–99)
Glucose-Capillary: 105 mg/dL — ABNORMAL HIGH (ref 70–99)
Glucose-Capillary: 110 mg/dL — ABNORMAL HIGH (ref 70–99)
Glucose-Capillary: 101 mg/dL — ABNORMAL HIGH (ref 70–99)
Glucose-Capillary: 125 mg/dL — ABNORMAL HIGH (ref 70–99)
Glucose-Capillary: 131 mg/dL — ABNORMAL HIGH (ref 70–99)

## 2023-10-10 MED ORDER — HEPARIN BOLUS VIA INFUSION: 1500.0000 [IU] | Freq: Once | INTRAVENOUS | Status: DC

## 2023-10-10 MED ORDER — PROSOURCE TF20 ENFIT COMPATIBL EN LIQD: 60.0000 mL | Freq: Every day | ENTERAL | Status: DC

## 2023-10-10 MED ORDER — SODIUM CHLORIDE 0.9 % IV SOLN
3.0000 g | Freq: Two times a day (BID) | INTRAVENOUS | Status: AC
  Administered 2023-10-10 – 2023-10-14 (×10): 3 g via INTRAVENOUS
  Filled 2023-10-10 (×10): qty 8

## 2023-10-10 MED ORDER — CHLORHEXIDINE GLUCONATE CLOTH 2 % EX PADS
6.0000 | MEDICATED_PAD | Freq: Every day | CUTANEOUS | Status: DC
  Administered 2023-10-12: 6 via TOPICAL

## 2023-10-10 MED ORDER — FENTANYL CITRATE PF 50 MCG/ML IJ SOSY
25.0000 ug | PREFILLED_SYRINGE | INTRAMUSCULAR | Status: DC | PRN
  Administered 2023-10-10: 50 ug via INTRAVENOUS
  Administered 2023-10-10: 100 ug via INTRAVENOUS
  Administered 2023-10-11: 50 ug via INTRAVENOUS
  Administered 2023-10-11: 100 ug via INTRAVENOUS
  Filled 2023-10-10: qty 1
  Filled 2023-10-10: qty 2
  Filled 2023-10-10 (×3): qty 1

## 2023-10-10 MED ORDER — NOREPINEPHRINE 4 MG/250ML-% IV SOLN
INTRAVENOUS | Status: AC
  Filled 2023-10-10: qty 250

## 2023-10-10 MED ORDER — NEPRO/CARBSTEADY PO LIQD
1000.0000 mL | ORAL | Status: DC
  Administered 2023-10-10: 1000 mL
  Filled 2023-10-10 (×2): qty 1000

## 2023-10-10 MED ORDER — HEPARIN SODIUM (PORCINE) 1000 UNIT/ML IJ SOLN
2500.0000 [IU] | Freq: Once | INTRAMUSCULAR | Status: AC
  Administered 2023-10-10: 2500 [IU] via INTRAVENOUS
  Filled 2023-10-10: qty 3

## 2023-10-10 NOTE — Procedures (Signed)
 Pt had pressure held by [place] for 10 min at the groin site and then had a new dressing applied to the groin site at this time on 7/25.

## 2023-10-10 NOTE — Progress Notes (Signed)
 ICU INTUBATED. Informed consent signed and in chart.   TX duration: 3 hours  Patient tolerated well.   Hand-off given to patient's nurse.   Access used: fistula Access issues: none  Total UF removed: Medication(s) given: heparin : 2500 units bolus Post HD VS: 125/73 Post HD weight: unable to obtain.   10/10/23 0645  Vitals  Temp 100 F (37.8 C)  BP 123/71  MAP (mmHg) 86  BP Location Right Arm  BP Method Automatic  Patient Position (if appropriate) Lying  Pulse Rate 67  Pulse Rate Source Monitor  ECG Heart Rate 68  Resp (!) 22  Oxygen Therapy  SpO2 94 %  O2 Device Ventilator  During Treatment Monitoring  Blood Flow Rate (mL/min) 0 mL/min  Arterial Pressure (mmHg) 2.63 mmHg  Venous Pressure (mmHg) -2.42 mmHg  TMP (mmHg) -50.7 mmHg  Ultrafiltration Rate (mL/min) 118 mL/min  Dialysate Flow Rate (mL/min) 0 ml/min  Duration of HD Treatment -hour(s) 3 hour(s)  Cumulative Fluid Removed (mL) per Treatment  2050.01  Post Treatment  Dialyzer Clearance Lightly streaked  Hemodialysis Intake (mL) 0 mL  Liters Processed 72  Fluid Removed (mL) 2100 mL  Tolerated HD Treatment Yes  Post-Hemodialysis Comments HD tx achieved as expected, tolerated well, pt intubated..  AVG/AVF Arterial Site Held (minutes) 10 minutes  AVG/AVF Venous Site Held (minutes) 10 minutes  Note  Patient Observations pt is in bed intubated  Fistula / Graft Left Upper arm Arteriovenous fistula  Placement Date/Time: 11/11/20 0835   Placed prior to admission: No  Orientation: Left  Access Location: Upper arm  Access Type: (c) Arteriovenous fistula  Site Condition No complications  Fistula / Graft Assessment Present;Thrill;Bruit  Status Deaccessed  Drainage Description None      [facility]

## 2023-10-10 NOTE — Progress Notes (Signed)
 Initial Nutrition Assessment  DOCUMENTATION CODES:   Obesity unspecified  INTERVENTION:   Initiate tube feeding via cortrak tube: Nepro at 55 ml/h (1320 ml per day)  Provides 2376 kcal, 106 gm protein, 959 ml free water daily   NUTRITION DIAGNOSIS:   Inadequate oral intake related to inability to eat as evidenced by NPO status.  GOAL:   Patient will meet greater than or equal to 90% of their needs  MONITOR:   TF tolerance  REASON FOR ASSESSMENT:   Consult Enteral/tube feeding initiation and management  ASSESSMENT:   Pt with PMH of obesity, HTN, end-stage renal dz on HD TTS, combined systolic and diastolic heart failure admitted 7/25 with L-sided weakness dx with acute R MCA infarct.   Pt discussed during ICU rounds and with RN and MD.  EDW 138 kg Noted last outpatient post HD weight: 142 kg  7/25 - admitted acute R MCA s/p TNK and mechanical thrombectomy; intubated 7/27 - TF protocol started 7/28 - cortrak placed; tip gastric    Pt remains intubated, unable to extubate today. No family present.   Medications reviewed and include: SSI every 6 hours, protonix  Precedex    Labs reviewed:  Na 132 CBG: 108-115 Phos 8.8 A1C 5.1  UF: 2100 ml  UOP 160 ml    NUTRITION - FOCUSED PHYSICAL EXAM:  Flowsheet Row Most Recent Value  Orbital Region No depletion  Upper Arm Region No depletion  Thoracic and Lumbar Region No depletion  Buccal Region No depletion  Temple Region No depletion  Clavicle Bone Region No depletion  Clavicle and Acromion Bone Region No depletion  Scapular Bone Region No depletion  Dorsal Hand No depletion  Patellar Region No depletion  Anterior Thigh Region No depletion  Posterior Calf Region No depletion  Edema (RD Assessment) None  Hair Reviewed  Eyes Unable to assess  Mouth Unable to assess  Skin Reviewed  Nails Reviewed    Diet Order:   Diet Order             Diet NPO time specified  Diet effective now                    EDUCATION NEEDS:   Not appropriate for education at this time  Skin:  Skin Assessment: Reviewed RN Assessment  Last BM:  unknown  Height:   Ht Readings from Last 1 Encounters:  09/14/23 6' (1.829 m)    Weight:   Wt Readings from Last 1 Encounters:  10/10/23 (!) 141.4 kg    BMI:  Body mass index is 42.28 kg/m.  Estimated Nutritional Needs:   Kcal:  2300-2500  Protein:  100-120 grams  Fluid:  1.2 L/day  Powell SQUIBB., RD, LDN, CNSC See AMiON for contact information

## 2023-10-10 NOTE — TOC CAGE-AID Note (Signed)
 Transition of Care [facility]) - CAGE-AID Screening   Patient Details  Name: Nathan Campbell MRN: [id number] Date of Birth: Nov 01, 1975  Transition of Care [facility]) CM/SW Contact:    [name], LCSW Phone Number: 10/10/2023, 9:48 AM   Clinical Narrative: Patient is currently intubated and unable to participate in screening.    CAGE-AID Screening: Substance Abuse Screening unable to be completed due to: : Patient unable to participate

## 2023-10-10 NOTE — Progress Notes (Signed)
 [facility] Progress Note   Subjective:    Seen and examined patient on the ICU. Per ICU RN, unsuccessful with extubated; thus, remains intubated. Tolerated HD overnight with net UF 2.1L. Next HD 7/29.  Objective Vitals:   10/10/23 0900 10/10/23 0928 10/10/23 1000 10/10/23 1100  BP: 135/78 129/74 (!) 141/82 126/70  Pulse: 86  84 76  Resp: 18  (!) 25 (!) 22  Temp: (!) 100.6 F (38.1 C)  (!) 100.9 F (38.3 C) (!) 101.1 F (38.4 C)  TempSrc:      SpO2: 95%  95% 93%  Weight:       Physical Exam General: On vent, intubated, sedated Heart: S1 and S2; No murmurs, gallops, or rubs Lungs: Clear anteriorly Abdomen: Soft, round Extremities: No LE edema Dialysis Access: AVF (+) B/T   Filed Weights   10/08/23 1524 10/08/23 1853 10/10/23 0703  Weight: (!) 142.1 kg (!) 138 kg (!) 141.4 kg    Intake/Output Summary (Last 24 hours) at 10/10/2023 1134 Last data filed at 10/10/2023 1000 Gross per 24 hour  Intake 1092.45 ml  Output 2260 ml  Net -1167.55 ml    Additional Objective Labs: Basic Metabolic Panel: Recent Labs  Lab 10/08/23 0505 10/09/23 0610 10/10/23 0625  NA 135 136 132*  K 4.6 4.7 4.0  CL 99 94* 92*  CO2 22 24 24   GLUCOSE 84 97 108*  BUN 72* 53* 37*  CREATININE 12.81* 11.53* 7.69*  CALCIUM  8.6* 9.6 9.6  PHOS  --  8.8*  --    Liver Function Tests: Recent Labs  Lab 10/07/23 0851 10/08/23 0505  AST 15 10*  ALT 13 11  ALKPHOS 82 66  BILITOT 0.6 0.8  PROT 7.2 6.1*  ALBUMIN 3.7 3.1*   No results for input(s): LIPASE, AMYLASE in the last 168 hours. CBC: Recent Labs  Lab 10/07/23 0851 10/07/23 1032 10/07/23 1551 10/08/23 0505 10/10/23 0625  WBC 7.9  --   --  8.6 11.6*  NEUTROABS 5.1  --   --  6.2 8.5*  HGB 11.2*   < > 9.5* 9.8* 10.6*  HCT 34.7*   < > 28.0* 29.8* 31.8*  MCV 99.4  --   --  98.7 96.7  PLT 229  --   --  203 236   < > = values in this interval not displayed.   Blood Culture    Component Value Date/Time   SDES  TRACHEAL ASPIRATE 10/08/2023 0627   SPECREQUEST NONE 10/08/2023 0627   CULT  10/08/2023 9372    CULTURE REINCUBATED FOR BETTER GROWTH Performed at [facility], [address] [place], [place] [address]    REPTSTATUS PENDING 10/08/2023 9372    Cardiac Enzymes: No results for input(s): CKTOTAL, CKMB, CKMBINDEX, TROPONINI in the last 168 hours. CBG: Recent Labs  Lab 10/09/23 1621 10/09/23 1924 10/09/23 2323 10/10/23 0324 10/10/23 0725  GLUCAP 108* 106* 113* 118* 105*   Iron  Studies: No results for input(s): IRON , TIBC, TRANSFERRIN, FERRITIN in the last 72 hours. Lab Results  Component Value Date   INR 1.0 10/07/2023   Studies/Results: DG Chest Port 1 View Result Date: 10/09/2023 CLINICAL DATA:  Acute respiratory failure EXAM: PORTABLE CHEST 1 VIEW COMPARISON:  10/08/2023 FINDINGS: Endotracheal tube and NG tube remain in place, unchanged. Cardiomegaly, vascular congestion. Increasing right infrahilar airspace opacity. Stable left retrocardiac opacity. No effusions or acute bony abnormality. IMPRESSION: Bilateral lower lobe airspace opacities, increasing on the right since prior study could reflect atelectasis or  infiltrates/pneumonia. Cardiomegaly, vascular congestion stable. Electronically Signed   By: Franky Crease M.D.   On: 10/09/2023 15:34   CT HEAD WO CONTRAST ( ) Result Date: 10/09/2023 EXAM: CT HEAD WITHOUT CONTRAST 10/09/2023 08:06:27 AM TECHNIQUE: CT of the head was performed without the administration of intravenous contrast. Automated exposure control, iterative reconstruction, and/or weight based adjustment of the mA/kV was utilized to reduce the radiation dose to as low as reasonably achievable. COMPARISON: None available. CLINICAL HISTORY: Stroke, follow up. Non con. Principal Problem: Acute ischemic stroke (HCC). Chief complaints: Code Stroke. FINDINGS: BRAIN AND VENTRICLES: Expected evolution of the large right MCA territory infarct is noted.  Effacement of the sulci is present. No significant midline shift is present. Hyperdense thrombus is present within sylvian branches of the right MCA. No acute left-sided abnormality is present. ORBITS: No acute abnormality. SINUSES: No acute abnormality. SOFT TISSUES AND SKULL: No acute soft tissue abnormality. No skull fracture. IMPRESSION: 1. Expected evolution of the large right MCA territory infarct with effacement of the sulci and hyperdense thrombus within sylvian branches of the right MCA. No significant midline shift. 2. No acute left-sided abnormality. Electronically signed by: Lonni Necessary MD 10/09/2023 08:29 AM EDT RP Workstation: [id number]   MR BRAIN WO CONTRAST Result Date: 10/08/2023 EXAM: MRI BRAIN WITHOUT CONTRAST 10/08/2023 12:38:36 PM TECHNIQUE: Multiplanar multisequence MRI of the head/brain was performed without the administration of intravenous contrast. COMPARISON: CT head without contrast 10/07/2023. CLINICAL HISTORY: Stroke, follow up. FINDINGS: BRAIN AND VENTRICLES: A large right MCA (middle cerebral artery) territory infarct is present. The right caudate head and lentiform nucleus are infarcted. Insular ribbon and ganglionic and supraganglionic cortical infarcts are noted. A focal area of cortical infarction is present in the medial anterior right frontal lobe. No focal hemorrhage is present. Diffuse T2 and FLAIR hyperintensities are present throughout the infarcted area. Additional periventricular T2 hyperintensities are moderately advanced for age. Diffuse cortical thickening is present with some mass effect but no significant midline shift. ORBITS: No acute abnormality. SINUSES AND MASTOIDS: Fluid levels are present in the posterior ethmoid air cells, the right maxillary sinus and bilateral sphenoid sinuses. Fluid is present in the nasopharynx. BONES AND SOFT TISSUES: Normal marrow signal. No acute soft tissue abnormality. IMPRESSION: 1. Large right MCA territory infarct with  associated diffuse cortical thickening and some mass effect, but no significant midline shift. No focal hemorrhage. 2. Moderately advanced periventricular T2 hyperintensities for age. This likely reflects sequela of chronic microvascular ischemia. 3. Diffuse sinus disease likely related to intubated status. Electronically signed by: Lonni Necessary MD 10/08/2023 12:59 PM EDT RP Workstation: [id number]   MR ANGIO HEAD WO CONTRAST Result Date: 10/08/2023 EXAM: MR Angiography Head without intravenous Contrast. 10/08/2023 12:38:20 PM TECHNIQUE: Magnetic resonance angiography images of the head without intravenous contrast. Multiplanar 2D and 3D reformatted images are provided for review. COMPARISON: Cerebral arteriogram 10/07/2023. CLINICAL HISTORY: Stroke, follow up. Interval revascularization of the right middle cerebral artery. FINDINGS: ANTERIOR CIRCULATION: No significant stenosis of the internal carotid arteries. No significant stenosis of the anterior cerebral arteries. A distal right M2 occlusion is somewhat more proximal than on the final angio images. No new occlusion is present. No aneurysm. POSTERIOR CIRCULATION: No significant stenosis of the posterior cerebral arteries. No significant stenosis of the basilar artery. No significant stenosis of the vertebral arteries. No aneurysm. IMPRESSION: 1. Distal right M2 occlusion, somewhat more proximal than on the final angio images. No new occlusion is present. Electronically signed by: Lonni Necessary MD 10/08/2023 12:55 PM EDT  RP Workstation: [id number]    Medications:  ampicillin -sulbactam (UNASYN ) IV Stopped (10/10/23 0959)   dexmedetomidine  (PRECEDEX ) IV infusion 0.7 mcg/kg/hr (10/10/23 1128)   norepinephrine       amLODipine   5 mg Per Tube Daily   aspirin   300 mg Rectal Daily   Or   aspirin   81 mg Per Tube Daily   carvedilol   6.25 mg Per Tube BID WC   Chlorhexidine  Gluconate Cloth  6 each Topical Daily   feeding supplement (VITAL HIGH  PROTEIN)  1,000 mL Per Tube Q24H   heparin   1,500 Units Intravenous Once   heparin  injection (subcutaneous)  5,000 Units Subcutaneous Q8H   insulin  aspart  0-6 Units Subcutaneous Q4H   labetalol   20 mg Intravenous Once   mouth rinse  15 mL Mouth Rinse Q2H   pantoprazole  (PROTONIX ) IV  40 mg Intravenous QHS    Dialysis Orders: SW TTS 4h   B500   138kg   2K bath AVF   Heparin  4000+ Wt gains 3.5- 5 kg on average Usually comes off 2-4kg over Last OP HD 7/24, post wt 142kg  Home bp meds: Norvasc  10 every day Coreg  25 bid Torsemide  60mg  bid  Assessment/Plan: Acute CVA: w/ L sided weakness. S/P TNK 7/25. Also went to NIR. Intubated in ICU. Per CCM. Per ICU RN, tried to extubated but was unsuccessful, remains intubated.  ESRD: on HD TTS. Had HD last night in ICU. Received HD again overnight (2.1L removed). Next HD 7/29. Push UF as tolerated.  HTN: per CCM Volume: close to dry wt, UF 2-3 L w/ next HD Anemia of esrd: Hb 11- 12, no esa needs.   Nathan Piety, NP [facility] 10/10/2023,11:34 AM  LOS: 3 days

## 2023-10-10 NOTE — Progress Notes (Signed)
 Transition of Care [facility]) - Inpatient Brief Assessment   Patient Details  Name: Nathan Campbell MRN: [id number] Date of Birth: February 13, 1976  Transition of Care [facility]) CM/SW Contact:    Robynn Eileen Hoose, RN Phone Number: 10/10/2023, 3:30 PM   Clinical Narrative: Patient from home with stroke. Patient intubated on vent. Patient to have dialysis 10/11/2023   Transition of Care Asessment: Insurance and Status: Insurance coverage has been reviewed Patient has primary care physician: No (None listed) Home environment has been reviewed: Home Prior level of function:: Independent Prior/Current Home Services: No current home services Social Drivers of Health Review: SDOH reviewed no interventions necessary Readmission risk has been reviewed: Yes Transition of care needs: no transition of care needs at this time

## 2023-10-10 NOTE — Progress Notes (Addendum)
 STROKE TEAM PROGRESS NOTE   INTERIM HISTORY/SUBJECTIVE Patient remains hemodynamically stable with Tmax of 100.2.  Continues on Unasyn  for pneumonia.  He remains intubated, but plans are made for SBT and hopeful extubation later today.  His mother arrived later on in the afternoon and I gave her an update.  Patient neurological exam appears stable and can be easily aroused and follows commands well on the right side and active and able to move left lower extremity of the bed.  Left upper extremity remains plegic and he is not able to look to the left across midline OBJECTIVE  CBC    Component Value Date/Time   WBC 11.6 (H) 10/10/2023 0625   RBC 3.29 (L) 10/10/2023 0625   HGB 10.6 (L) 10/10/2023 0625   HGB 9.2 (L) 01/22/2021 1235   HCT 31.8 (L) 10/10/2023 0625   HCT 28.9 (L) 01/22/2021 1235   PLT 236 10/10/2023 0625   PLT 208 01/22/2021 1235   MCV 96.7 10/10/2023 0625   MCV 93 01/22/2021 1235   MCH 32.2 10/10/2023 0625   MCHC 33.3 10/10/2023 0625   RDW 12.7 10/10/2023 0625   RDW 12.0 01/22/2021 1235   LYMPHSABS 1.1 10/10/2023 0625   LYMPHSABS 0.8 01/22/2021 1235   MONOABS 1.6 (H) 10/10/2023 0625   EOSABS 0.3 10/10/2023 0625   EOSABS 0.1 01/22/2021 1235   BASOSABS 0.0 10/10/2023 0625   BASOSABS 0.0 01/22/2021 1235    BMET    Component Value Date/Time   NA 132 (L) 10/10/2023 0625   K 4.0 10/10/2023 0625   CL 92 (L) 10/10/2023 0625   CO2 24 10/10/2023 0625   GLUCOSE 108 (H) 10/10/2023 0625   BUN 37 (H) 10/10/2023 0625   CREATININE 7.69 (H) 10/10/2023 0625   CALCIUM  9.6 10/10/2023 0625   GFRNONAA 8 (L) 10/10/2023 0625    IMAGING past 24 hours DG Chest Port 1 View Result Date: 10/09/2023 CLINICAL DATA:  Acute respiratory failure EXAM: PORTABLE CHEST 1 VIEW COMPARISON:  10/08/2023 FINDINGS: Endotracheal tube and NG tube remain in place, unchanged. Cardiomegaly, vascular congestion. Increasing right infrahilar airspace opacity. Stable left retrocardiac opacity. No effusions or  acute bony abnormality. IMPRESSION: Bilateral lower lobe airspace opacities, increasing on the right since prior study could reflect atelectasis or infiltrates/pneumonia. Cardiomegaly, vascular congestion stable. Electronically Signed   By: Franky Crease M.D.   On: 10/09/2023 15:34    Vitals:   10/10/23 0800 10/10/23 0803 10/10/23 0805 10/10/23 0928  BP: 136/81 136/81 136/81 129/74  Pulse: 71 70 69   Resp: (!) 22  (!) 24   Temp: 100.2 F (37.9 C)     TempSrc: Core  Core   SpO2: 95%  95%   Weight:         PHYSICAL EXAM General:  Sedated CV: Regular rate and rhythm on monitor Respiratory: Mechanically ventilated, respirations synchronous with ventilator GI: Abdomen soft and nontender   NEURO:  Mental Status: Sedated, intubated, no verbal output.  Is able to follow one step commands Speech/Language:Following commands, sedated  Cranial Nerves:  II:  PERRL III, IV, VI: Right gaze preference but able to cross midline. Eyelids elevate symmetrically.  VII: Obscured by ETT VIII: hearing intact to voice. IX, X: Cough and gag intact KP:Yzji midline  XII: tongue is midline without fasciculations. Motor:  Moves right upper and lower extremity against gravity purposefully, localizes sternal rub.  Able to move left lower extremity against gravity.  Left upper extremity appears plegic and flaccid   Tone: is normal  and bulk is normal Sensation- Withdraws in right upper extremity and bilateral lower extremities Coordination: Unable to complete Gait- deferred   ASSESSMENT/PLAN  [name] is a 48 y.o. male with history of obesity, hypertension, end-stage renal disease on HD TTS, combined systolic and diastolic heart failure who presented to the emergency department on 7/25 with left-sided weakness, slurred speech.  NIH on Admission 13. IV TNK after CT head showed what looked like a calcific embolus in the right MCA and a good ASPECT  score and CT angiogram could not be obtained with  emergent cerebral catheter angiogram showed right M1 occlusion and he underwent  TICI 2 b revascularization of the superior and inferior division to the M2 region but was left intubated for medical issues given history of CHF and renal failure   Acute Ischemic Infarct:  right MCA territory infarct  s/p mechanical thrombectomy and TNK Etiology: Embolic versus large vessel disease Code Stroke CT head - Findings consistent with acute right MCA territory ischemic changes, ASPECT score 7. And calcific density is noted in the proximal right M1 segment. This is most concerning for a calcific embolus. Intracranial atherosclerotic changes considered less likely. MRI   Large right MCA territory infarct with associated diffuse cortical thickening and some mass effect MRA  Distal right M2 occlusion, somewhat more proximal than on the final angio images. No new occlusion is present. Repeat CT Head-  Expected evolution of the large right MCA territory infarct with effacement of the sulci and hyperdense thrombus within sylvian branches of the right MCA. No significant midline shift. 2D Echo LA mildly dilated, right atria fairly dilated.  EF 50-55% LDL 85 HgbA1c 5.1 VTE prophylaxis -heparin  No antithrombotic prior to admission, now on aspirin  81 mg daily and aspirin  300 mg suppository daily  Therapy recommendations:  Pending Disposition:  Neuro ICU  S/p thrombectomy of R M1 with TICI 2B revascularization  Intubated prior to procedure due to agitation   Acute hypoxic and hypercapnic respiratory failure Extubate per CCM SBT/ VAP protocol  Precedex   Combined systolic and diastolic heart failure Hypertension Pulmonary hypertension Home meds: Amlodipine , isosorbide  mononitrate, furosemide , carvedilol  Resume GDMT when BP stabilized Stable Blood Pressure Goal: BP less than 180/105   Hyperlipidemia LDL 85 goal < 70 Add atorvastatin Continue statin at discharge  Elevated  triglycerides [phone] Propofol  switched to Precedex   Dysphagia NPO Tube feeding started  ESRD on HD Nephrology consulted HD 7/26, next treatment 7/29   Hospital day # 3  Patient seen and examined by NP/APP with MD. MD to update note as needed.   Cortney E Everitt Clint Kill , MSN, AGACNP-BC [facility] See Amion for schedule and pager information 10/10/2023 9:58 AM   I have personally obtained history,examined this patient, reviewed notes, independently viewed imaging studies, participated in medical decision making and plan of care.ROS completed by me personally and pertinent positives fully documented  I have made any additions or clarifications directly to the above note. Agree with note above.  Patient remains intubated for respiratory failure and continued to be sick with aspiration pneumonia and renal failure.  Continue ventilatory support and wean off ventilatory support and sedation as tolerated per critical care team.  Plan PEG tube for nutrition and medications.  Aspirin  and Plavix for 3 weeks followed by aspirin  alone.  Will need 30-day heart monitor at discharge for paroxysmal A-fib.  Long discussion with the patient's mother, answered questions about his prognosis and evaluation and treatment plan.  Discussed with Dr. [name]  critical care medicine. This patient is critically ill and at significant risk of neurological worsening, death and care requires constant monitoring of vital signs, hemodynamics,respiratory and cardiac monitoring, extensive review of multiple databases, frequent neurological assessment, discussion with family, other specialists and medical decision making of high complexity.I have made any additions or clarifications directly to the above note.This critical care time does not reflect procedure time, or teaching time or supervisory time of PA/NP/Med Resident etc but could involve care discussion time.  I spent 40 minutes of neurocritical care time  in  the care of  this patient.     Eather Popp, MD Medical Director [facility] Pager: [phone] 10/10/2023 2:38 PM

## 2023-10-10 NOTE — Progress Notes (Signed)
 Pt receives OP HD TTS at [facility] clinic, 0730 chair time. Will assist as needed.   Nathan Campbell Dialysis navigator [email or web address]

## 2023-10-10 NOTE — Progress Notes (Signed)
 PT Cancellation Note  Patient Details Name: Nathan Campbell MRN: [id number] DOB: 1975/11/15   Cancelled Treatment:    Reason Eval/Treat Not Completed: Patient not medically ready  Per RN, pt struggling with vent compliance and not appropriate for activity/PT session at this time. Will continue to follow.    Nathan RAMAN, PT Acute Rehabilitation Services  Office [phone]  Nathan Campbell 10/10/2023, 12:25 PM

## 2023-10-10 NOTE — Progress Notes (Signed)
 NAME:  Nathan Campbell, MRN:  [id number], DOB:  01/16/1976, LOS: 3 ADMISSION DATE:  10/07/2023, CONSULTATION DATE:  7/25 REFERRING MD: Stroke, CHIEF COMPLAINT: Stroke  History of Present Illness:  48 year old male with past medical history of obesity, hypertension, end-stage renal disease on HD TTS, combined systolic and diastolic heart failure who presented to the emergency department on 7/25 with left-sided weakness, slurred speech.  LKW 0630.  Presented as a code stroke and was greeted by neurology at the bridge. CT head demonstrated acute right MCA infarct, ASPECTS 7.  Additionally he has calcified density along the proximal right M1 segment concerning for calcific embolus. CTA unable to be obtained for restlessness and concern over his airway so he was intubated. Received TNK @ [id number]. Taken to [facility] @ [phone] for angiogram and mechanical intervention.  Pertinent  Medical History  obesity, hypertension, end-stage renal disease on HD TTS, combined systolic and diastolic heart failure  Significant Hospital Events: Including procedures, antibiotic start and stop dates in addition to other pertinent events   7/25: Presented as a code stroke, admit , brought to ICU intubated, PEEP of 10 added 7/26 HD 7/27 tachypneic on pressure support 8/5 >> HD  Interim History / Subjective:   Critically ill, intubated, Sedated on Precedex   Down to 50%/PEEP of 5 Underwent another round of hemodialysis yesterday Low-grade febrile 100  Objective   Blood pressure 136/81, pulse 69, temperature 100 F (37.8 C), resp. rate (!) 24, weight (!) 141.4 kg, SpO2 95%.    Vent Mode: PSV;CPAP FiO2 (%):  [50 %] 50 % Set Rate:  [22 bmp] 22 bmp Vt Set:  [620 mL-6200 mL] 6200 mL PEEP:  [5 cmH20] 5 cmH20 Pressure Support:  [10 cmH20-15 cmH20] 15 cmH20 Plateau Pressure:  [16 cmH20-22 cmH20] 16 cmH20   Intake/Output Summary (Last 24 hours) at 10/10/2023 0842 Last data filed at 10/10/2023 0645 Gross per 24 hour  Intake  773.52 ml  Output 2260 ml  Net -1486.48 ml   Filed Weights   10/08/23 1524 10/08/23 1853 10/10/23 0703  Weight: (!) 142.1 kg (!) 138 kg (!) 141.4 kg    Examination: General: middle aged morbidly obese man, intubated HENT: pupils pinpoint, non reactive; ncat, ett, ogt Lungs: Decreased breath sounds bilateral, no accessory muscle use Cardiovascular: S1-S2 regular, no murmur Abdomen: Obese, soft Extremities: no pitting edema Neuro: Follows commands, good strength on right, left hemiplegia GU: foley  Labs show hyponatremia, BUN/creatinine decreased to 37/7.7, mild leukocytosis, stable anemia  Chest x-ray 7/27 shows vascular congestion, bibasal atelectasis/infiltrate  Resolved Hospital Problem list    Assessment & Plan:  Acute right MCA infarct s/p TNK and NIR mechanical thrombectomy  Left sided weakness and slurred speech. CT w/ acute right MCA infarct s/p TNK and TICI 2b revascularization.  MRI 7/26 shows large right MCA infarct with some mass effect - stroke team primary - Frequent neuro checks -Aspirin  daily - Neuroprotective measures: HOB > 30 degrees, normoglycemia, normothermia, electrolytes WNL - Continue Precedex , goal RASS 0  Acute hypoxic and hypercapneic respiratory failure 2/2 above  Intubated to facilitate CT, concern for airway protection in setting of acute stroke.  Persistent hypoxia may be due to pulm hypertension or due to fluid overload, aspiration possible with bibasilar infiltrates, hypoxia improved with HD - daily SAT/SBT , try to wean to extubation today - Add empiric Unasyn  for aspiration - VAP and PAD bundle in place  - titrate FiO2 to sat goal >92  - maintain peak/plats <30, driving pressures <84  Hypertension Home meds amlodipine , carvedilol , isosorbide  mononitrate   - SBP Goal 120-140  -resumed amlodipine  and carvedilol  at half doses   ESRD on HD TTS Anemia of chronic disease Last dialysis 7/27.  - Renal assisting - strict I&O - Avoid  nephrotoxic agents, renally dose medications - No response to diuretic, will hold   Combined systolic and diastolic heart failure Echo shows enlarged RA/RV, nondiagnostic bubble study  , worsening of pre-existing secondary pulm hypertension -on amlodipine , carvedilol , isosorbide  mononitrate, torsemide  60 BID  - GDMT when blood pressure improved - tele monitoring   Best Practice (right click and Reselect all SmartList Selections daily)   Diet/type: NPO tube feeds  DVT prophylaxis: SCD GI prophylaxis: PPI Lines: NA Foley:  Yes, and it is still needed Code Status:  full code Last date of multidisciplinary goals of care discussion [per primary]  Labs   CBC: Recent Labs  Lab 10/07/23 0851 10/07/23 1032 10/07/23 1551 10/08/23 0505 10/10/23 0625  WBC 7.9  --   --  8.6 11.6*  NEUTROABS 5.1  --   --  6.2 8.5*  HGB 11.2* 10.5* 9.5* 9.8* 10.6*  HCT 34.7* 31.0* 28.0* 29.8* 31.8*  MCV 99.4  --   --  98.7 96.7  PLT 229  --   --  203 236    Basic Metabolic Panel: Recent Labs  Lab 10/07/23 0849 10/07/23 0851 10/07/23 1032 10/07/23 1551 10/08/23 0505 10/09/23 0610 10/10/23 0625  NA 140 140 140 139 135 136 132*  K 4.3 4.3 4.6 4.3 4.6 4.7 4.0  CL 101 98  --   --  99 94* 92*  CO2  --  24  --   --  22 24 24   GLUCOSE 153* 152*  --   --  84 97 108*  BUN 57* 62*  --   --  72* 53* 37*  CREATININE 12.00* 10.84*  --   --  12.81* 11.53* 7.69*  CALCIUM   --  9.8  --   --  8.6* 9.6 9.6  MG  --   --   --   --   --  2.1  --   PHOS  --   --   --   --   --  8.8*  --    GFR: Estimated Creatinine Clearance: 17.7 mL/min (A) (by C-G formula based on SCr of 7.69 mg/dL (H)). Recent Labs  Lab 10/07/23 0851 10/08/23 0505 10/10/23 0625  WBC 7.9 8.6 11.6*    Liver Function Tests: Recent Labs  Lab 10/07/23 0851 10/08/23 0505  AST 15 10*  ALT 13 11  ALKPHOS 82 66  BILITOT 0.6 0.8  PROT 7.2 6.1*  ALBUMIN 3.7 3.1*   No results for input(s): LIPASE, AMYLASE in the last 168  hours. No results for input(s): AMMONIA in the last 168 hours.  ABG    Component Value Date/Time   PHART 7.424 10/07/2023 1551   PCO2ART 38.2 10/07/2023 1551   PO2ART 70 (L) 10/07/2023 1551   HCO3 25.0 10/07/2023 1551   TCO2 26 10/07/2023 1551   O2SAT 94 10/07/2023 1551     Coagulation Profile: Recent Labs  Lab 10/07/23 0851  INR 1.0    Cardiac Enzymes: No results for input(s): CKTOTAL, CKMB, CKMBINDEX, TROPONINI in the last 168 hours.  HbA1C: Hgb A1c MFr Bld  Date/Time Value Ref Range Status  10/08/2023 05:05 AM 5.1 4.8 - 5.6 % Final    Comment:    (NOTE) Diagnosis of Diabetes The following HbA1c  ranges recommended by the American Diabetes Association (ADA) may be used as an aid in the diagnosis of diabetes mellitus.  Hemoglobin             Suggested A1C NGSP%              Diagnosis  <5.7                   Non Diabetic  5.7-6.4                Pre-Diabetic  >6.4                   Diabetic  <7.0                   Glycemic control for                       adults with diabetes.    06/11/2016 10:57 AM 4.7 (L) 4.8 - 5.6 % Final    Comment:    (NOTE)         Pre-diabetes: 5.7 - 6.4         Diabetes: >6.4         Glycemic control for adults with diabetes: <7.0     CBG: Recent Labs  Lab 10/09/23 1621 10/09/23 1924 10/09/23 2323 10/10/23 0324 10/10/23 0725  GLUCAP 108* 106* 113* 118* 105*    Critical care time: 33 m    Ercia Crisafulli V. [name], MD [facility] 10/10/23 8:42 AM  Please see [email or web address] for pager details.  From 7A-7P if no response, please call [phone] After hours, please call ELink [phone]

## 2023-10-10 NOTE — Procedures (Signed)
 Cortrak  Tube Type:  Cortrak - 43 inches Tube Location:  Left nare Initial Placement:  Stomach Secured by: Bridle Technique Used to Measure Tube Placement:  Marking at nare/corner of mouth Cortrak Secured At:  80 cm   Cortrak Tube Team Note:  Consult received to place a Cortrak feeding tube.   No x-ray is required. RN may begin using tube.   If the tube becomes dislodged please keep the tube and contact the Cortrak team at [email or web address] for replacement.  If after hours and replacement cannot be delayed, place a NG tube and confirm placement with an abdominal x-ray.    Augustin Shams MS, RD, LDN If unable to be reached, please send secure chat to RD inpatient available from 8:00a-4:00p daily

## 2023-10-11 ENCOUNTER — Inpatient Hospital Stay (HOSPITAL_COMMUNITY)

## 2023-10-11 DIAGNOSIS — I63311 Cerebral infarction due to thrombosis of right middle cerebral artery: Secondary | ICD-10-CM | POA: Diagnosis not present

## 2023-10-11 DIAGNOSIS — I69391 Dysphagia following cerebral infarction: Secondary | ICD-10-CM | POA: Diagnosis not present

## 2023-10-11 DIAGNOSIS — I10 Essential (primary) hypertension: Secondary | ICD-10-CM | POA: Diagnosis not present

## 2023-10-11 DIAGNOSIS — I63411 Cerebral infarction due to embolism of right middle cerebral artery: Secondary | ICD-10-CM | POA: Diagnosis not present

## 2023-10-11 DIAGNOSIS — I779 Disorder of arteries and arterioles, unspecified: Secondary | ICD-10-CM | POA: Diagnosis not present

## 2023-10-11 DIAGNOSIS — J9602 Acute respiratory failure with hypercapnia: Secondary | ICD-10-CM | POA: Diagnosis not present

## 2023-10-11 DIAGNOSIS — J9601 Acute respiratory failure with hypoxia: Secondary | ICD-10-CM | POA: Diagnosis not present

## 2023-10-11 DIAGNOSIS — E785 Hyperlipidemia, unspecified: Secondary | ICD-10-CM | POA: Diagnosis not present

## 2023-10-11 LAB — CBC
HCT: 30.7 % — ABNORMAL LOW (ref 39.0–52.0)
Hemoglobin: 9.9 g/dL — ABNORMAL LOW (ref 13.0–17.0)
MCH: 31.8 pg (ref 26.0–34.0)
MCHC: 32.2 g/dL (ref 30.0–36.0)
MCV: 98.7 fL (ref 80.0–100.0)
Platelets: 233 K/uL (ref 150–400)
RBC: 3.11 MIL/uL — ABNORMAL LOW (ref 4.22–5.81)
RDW: 12.8 % (ref 11.5–15.5)
WBC: 11.2 K/uL — ABNORMAL HIGH (ref 4.0–10.5)
nRBC: 0 % (ref 0.0–0.2)

## 2023-10-11 LAB — BASIC METABOLIC PANEL WITH GFR
Anion gap: 18 — ABNORMAL HIGH (ref 5–15)
BUN: 68 mg/dL — ABNORMAL HIGH (ref 6–20)
CO2: 23 mmol/L (ref 22–32)
Calcium: 9.6 mg/dL (ref 8.9–10.3)
Chloride: 95 mmol/L — ABNORMAL LOW (ref 98–111)
Creatinine, Ser: 12.66 mg/dL — ABNORMAL HIGH (ref 0.61–1.24)
GFR, Estimated: 4 mL/min — ABNORMAL LOW (ref >60)
Glucose, Bld: 151 mg/dL — ABNORMAL HIGH (ref 70–99)
Potassium: 4.8 mmol/L (ref 3.5–5.1)
Sodium: 136 mmol/L (ref 135–145)

## 2023-10-11 LAB — GLUCOSE, CAPILLARY
Glucose-Capillary: 108 mg/dL — ABNORMAL HIGH (ref 70–99)
Glucose-Capillary: 119 mg/dL — ABNORMAL HIGH (ref 70–99)
Glucose-Capillary: 120 mg/dL — ABNORMAL HIGH (ref 70–99)
Glucose-Capillary: 120 mg/dL — ABNORMAL HIGH (ref 70–99)
Glucose-Capillary: 133 mg/dL — ABNORMAL HIGH (ref 70–99)
Glucose-Capillary: 152 mg/dL — ABNORMAL HIGH (ref 70–99)

## 2023-10-11 MED ORDER — LACTULOSE 10 GM/15ML PO SOLN
20.0000 g | Freq: Once | ORAL | Status: AC
  Administered 2023-10-11: 20 g
  Filled 2023-10-11: qty 30

## 2023-10-11 MED ORDER — LABETALOL HCL 5 MG/ML IV SOLN
10.0000 mg | INTRAVENOUS | Status: DC | PRN
  Administered 2023-10-12 – 2023-10-13 (×3): 20 mg via INTRAVENOUS
  Filled 2023-10-11 (×4): qty 4

## 2023-10-11 MED ORDER — ORAL CARE MOUTH RINSE
15.0000 mL | OROMUCOSAL | Status: DC
  Administered 2023-10-11 – 2023-10-12 (×5): 15 mL via OROMUCOSAL

## 2023-10-11 MED ORDER — AMLODIPINE BESYLATE 5 MG PO TABS
10.0000 mg | ORAL_TABLET | Freq: Every day | ORAL | Status: DC
  Administered 2023-10-11 – 2023-10-17 (×7): 10 mg
  Filled 2023-10-11 (×2): qty 2
  Filled 2023-10-11 (×2): qty 1
  Filled 2023-10-11: qty 2
  Filled 2023-10-11: qty 1
  Filled 2023-10-11: qty 2
  Filled 2023-10-11: qty 1

## 2023-10-11 MED ORDER — ISOSORBIDE DINITRATE 5 MG PO TABS
5.0000 mg | ORAL_TABLET | Freq: Two times a day (BID) | ORAL | Status: DC
  Administered 2023-10-11 – 2023-10-18 (×15): 5 mg
  Filled 2023-10-11 (×17): qty 1

## 2023-10-11 MED ORDER — BISACODYL 5 MG PO TBEC: 5.0000 mg | DELAYED_RELEASE_TABLET | Freq: Every day | ORAL | Status: DC | PRN

## 2023-10-11 MED ORDER — DOCUSATE SODIUM 50 MG/5ML PO LIQD: 100.0000 mg | Freq: Two times a day (BID) | ORAL | Status: DC | PRN

## 2023-10-11 NOTE — Progress Notes (Signed)
 Pt has BIPAP PRN/at bedtime.  Holding off at this time due to pt has been vomiting.

## 2023-10-11 NOTE — Progress Notes (Signed)
 Physical Therapy Treatment Patient Details Name: Nathan Campbell MRN: [id number] DOB: 1975/10/02 Today's Date: 10/11/2023   History of Present Illness The pt is a 48 yo male presenting 7/25 with L-sided weakness and slurred speech. Intubated for airway protection, pt given TNK, and is s/p revascularization of R MCA with IR 7/25. 7/29 extubated  PMH includes: CHF, cardiomyopathy, ESRD on HD, HTN, and obesity (BMI 41).    PT Comments  Patient seen post-extubation earlier today. Following commands x 4 extremities (AAROM for LUE). Progressed to chair position (ICU bed) and worked on LE exercises and coming forward into unsupported sitting. RR incr 36 with sats 95% with leaning forward and returned to supported sitting with sats 100% and RR back to 30 (where he was when session began). Patient returned to reclined position with pillows under Left side/shoulder and LUE to reduce his leftward lean. Towel roll to rt side of his head to prevent right lateral flexion.     If plan is discharge home, recommend the following: Two people to help with walking and/or transfers;Two people to help with bathing/dressing/bathroom;Assistance with cooking/housework;Assistance with feeding;Direct supervision/assist for medications management;Direct supervision/assist for financial management;Assist for transportation;Help with stairs or ramp for entrance;Supervision due to cognitive status   Can travel by private vehicle        Equipment Recommendations  Other (comment) (defer until gait training initiated)    Recommendations for Other Services       Precautions / Restrictions Precautions Precautions: Fall Recall of Precautions/Restrictions: Impaired Precaution/Restrictions Comments: cortrak, foley, watch SpO2 and RR Restrictions Weight Bearing Restrictions Per Provider Order: No     Mobility  Bed Mobility Overal bed mobility: Needs Assistance Bed Mobility: Supine to Sit     Supine to sit: Total assist,  HOB elevated     General bed mobility comments: dependent on totalA of 2 to reposition up in the bed and then brought into chair position;  SpO2 to 95% and RR to 36 with repositioning    Transfers                   General transfer comment: deferred due to incr RR and lethargy    Ambulation/Gait                   Stairs             Wheelchair Mobility     Tilt Bed    Modified Rankin (Stroke Patients Only) Modified Rankin (Stroke Patients Only) Pre-Morbid Rankin Score: No symptoms Modified Rankin: Severe disability     Balance Overall balance assessment: Needs assistance Sitting-balance support: Single extremity supported, Feet unsupported Sitting balance-Leahy Scale: Poor Sitting balance - Comments: in chair position, able to pull on rail with RUE and come forward to unsupported sitting with min assist; has posterior bias and incr RR and returned to supported sitting                                    Communication Communication Communication: Impaired Factors Affecting Communication: Difficulty expressing self  Cognition Arousal: Lethargic Behavior During Therapy: Flat affect   PT - Cognitive impairments: Difficult to assess Difficult to assess due to: Impaired communication                     PT - Cognition Comments: pt consistent with yes/no nodding. Extubated this a.m. and per RN has not vocalized yet.  followed commands with increased time. via yes/no questions oriented to place Following commands: Impaired Following commands impaired: Follows one step commands with increased time    Cueing Cueing Techniques: Verbal cues, Tactile cues  Exercises Other Exercises Other Exercises: Pt followed commands for AROM  bil LE (ankle pumps, LAQ); AAROM LUE with + shoulder elevation, elbow flex/ext in gravity eliminated position    General Comments General comments (skin integrity, edema, etc.): Pt using oral suction with  RUE appropriately (after coughing)      Pertinent Vitals/Pain Pain Assessment Pain Assessment: No/denies pain    Home Living     Available Help at Discharge:  (unknown) Type of Home: Apartment                  Prior Function            PT Goals (current goals can now be found in the care plan section) Acute Rehab PT Goals Patient Stated Goal: unable to state Time For Goal Achievement: 10/23/23 Potential to Achieve Goals: Good Progress towards PT goals: Progressing toward goals    Frequency    Min 3X/week      PT Plan      Co-evaluation              AM-PAC PT 6 Clicks Mobility   Outcome Measure  Help needed turning from your back to your side while in a flat bed without using bedrails?: Total Help needed moving from lying on your back to sitting on the side of a flat bed without using bedrails?: Total Help needed moving to and from a bed to a chair (including a wheelchair)?: Total Help needed standing up from a chair using your arms (e.g., wheelchair or bedside chair)?: Total Help needed to walk in hospital room?: Total Help needed climbing 3-5 steps with a railing? : Total 6 Click Score: 6    End of Session Equipment Utilized During Treatment: Oxygen Activity Tolerance: Patient limited by fatigue;Patient limited by lethargy Patient left: in bed;with call bell/phone within reach;with bed alarm set Nurse Communication: Mobility status PT Visit Diagnosis: Unsteadiness on feet (R26.81);Other abnormalities of gait and mobility (R26.89);Muscle weakness (generalized) (M62.81);Hemiplegia and hemiparesis Hemiplegia - Right/Left: Left Hemiplegia - dominant/non-dominant: Non-dominant Hemiplegia - caused by: Cerebral infarction     Time: 8674-8651 PT Time Calculation (min) (ACUTE ONLY): 23 min  Charges:    $Therapeutic Exercise: 8-22 mins $Therapeutic Activity: 8-22 mins PT General Charges $$ ACUTE PT VISIT: 1 Visit                      Macario RAMAN, PT Acute Rehabilitation Services  Office [phone]    Macario SHAUNNA Soja 10/11/2023, 1:54 PM

## 2023-10-11 NOTE — Evaluation (Signed)
 Speech Language Pathology Evaluation Patient Details Name: Nathan Campbell MRN: [id number] DOB: 1975-10-02 Today's Date: 10/11/2023 Time: 8799-8774 SLP Time Calculation (min) (ACUTE ONLY): 25 min  Problem List:  Patient Active Problem List   Diagnosis Date Noted   Acute ischemic stroke (HCC) 10/07/2023   Stroke (cerebrum) (HCC) 10/07/2023   Middle cerebral artery embolism, right 10/07/2023   Cellulitis and abscess of hand 03/26/2022   ESRD (end stage renal disease) (HCC) 03/26/2022   Hand abscess 03/26/2022   Change in bowel habits 08/13/2020   Constipation 08/13/2020   Acute bilateral low back pain without sciatica 07/04/2019   Acute gout due to renal impairment involving left wrist 04/24/2019   Malignant hypertension 10/14/2016   Hypertensive heart disease with CHF (congestive heart failure) (HCC) 06/14/2016   NICM (nonischemic cardiomyopathy) (HCC) 06/14/2016   Renal failure    Hypertensive urgency 06/10/2016   CKD (chronic kidney disease), stage IV (HCC) 06/10/2016   Normocytic anemia 06/10/2016   Past Medical History:  Past Medical History:  Diagnosis Date   Acute combined systolic and diastolic CHF, NYHA class 4 (HCC) 06/10/2016   Anemia    Cardiomyopathy (HCC) 06/14/2016   CHF (congestive heart failure) (HCC)    COVID 10/2020   Dyspnea    ESRD on hemodialysis (HCC)    TTS at [facility]   Hypertension    Hypertensive heart and kidney disease with heart failure (HCC) 06/14/2016   Hypertensive heart disease with congestive heart failure (HCC)    Obesity    Past Surgical History:  Past Surgical History:  Procedure Laterality Date   A/V FISTULAGRAM Left 05/03/2022   Procedure: A/V Fistulagram;  Surgeon: Eliza Lonni RAMAN, MD;  Location: [facility] INVASIVE CV LAB;  Service: Cardiovascular;  Laterality: Left;   AV FISTULA PLACEMENT Left 11/11/2020   Procedure: LEFT ARM First Stage Basilic Vein Fistula Creation;  Surgeon: Sheree Penne Lonni, MD;  Location: [facility] OR;   Service: Vascular;  Laterality: Left;   BASCILIC VEIN TRANSPOSITION Left 01/23/2021   Procedure: Second Stage Basilic Vein Transposition of Left Arm Arteriovenous  Fistula;  Surgeon: Sheree Penne Lonni, MD;  Location: [facility] OR;  Service: Vascular;  Laterality: Left;  Block  to LMA    COLONOSCOPY     FISTULA SUPERFICIALIZATION Left 05/07/2022   Procedure: PLICATION OF ANEURYSM, LEFT ARM FISTULA;  Surgeon: Eliza Lonni RAMAN, MD;  Location: [facility] OR;  Service: Vascular;  Laterality: Left;   IR PATIENT EVAL TECH 0-60 MINS  10/07/2023   NO PAST SURGERIES     PERIPHERAL VASCULAR BALLOON ANGIOPLASTY Left 05/03/2022   Procedure: PERIPHERAL VASCULAR BALLOON ANGIOPLASTY;  Surgeon: Eliza Lonni RAMAN, MD;  Location: [facility] INVASIVE CV LAB;  Service: Cardiovascular;  Laterality: Left;  AVF   RADIOLOGY WITH ANESTHESIA N/A 10/07/2023   Procedure: RADIOLOGY WITH ANESTHESIA;  Surgeon: Radiologist, Medication, MD;  Location: [facility] OR;  Service: Radiology;  Laterality: N/A;   HPI:  48yo male admitted 10/07/23 with acute onset left side weakness and neglect. Pt intubated 7/25-29/25.  PMH: CHF, anemia, ESRD on HD, HTN, obesity. MRI - large right MCA infarct   Assessment / Plan / Recommendation Clinical Impression  Limited cognitive-linguistic evaluation completed. Pt awake, orally suctioning secretions. Left orofacial weakness with lingual deviation is evident. Unable to visualize velar elevation. Minimal jaw opening observed. Pt is nonverbal, and makes minimal attempts to vocalize. Pt appears to be struggling to manage secretions at this time, with frequent wet breathing sounds and precipitous drops in O2 sats to 60's (sats quickly return  to normal. RN informed). Pt had difficulty exhibiting a strong cough or throat clear, but was able to hock up secretions to be suctioned intermittently. Pt is able to follow simple 1-step directions and answer simple/personal yes/no questions. SLP will continue to follow pt with  goals for effective communication of wants/needs. Recommend orders for swallow evaluation prior to initiating PO diet.    SLP Assessment  SLP Recommendation/Assessment: Patient needs continued Speech Language Pathology Services SLP Visit Diagnosis: Cognitive communication deficit (R41.841)     Assistance Recommended at Discharge  Other (comment) (TBD)  Functional Status Assessment Patient has had a recent decline in their functional status and demonstrates the ability to make significant improvements in function in a reasonable and predictable amount of time.  Frequency and Duration min 2x/week  2 weeks      SLP Evaluation Cognition  Overall Cognitive Status: No family/caregiver present to determine baseline cognitive functioning Arousal/Alertness:  (awake, struggling to manage secretions) Orientation Level: Other (comment) (unable to assess)       Comprehension  Auditory Comprehension Yes/No Questions:  (answers personal/simple yes/no questions) Commands:  (follows 1-step commands)    Expression Expression Primary Mode of Expression: Nonverbal - gestures Verbal Expression Overall Verbal Expression: Other (comment) (minimal attempts to vocalize. No attempts to verbalize) Non-Verbal Means of Communication: Gestures   Oral / Motor  Oral Motor/Sensory Function Overall Oral Motor/Sensory Function: Moderate impairment Facial ROM: Reduced left Facial Symmetry: Abnormal symmetry left Facial Strength: Reduced left Facial Sensation:  (unable to assess) Lingual ROM: Reduced left Lingual Symmetry: Abnormal symmetry left Lingual Strength: Reduced Lingual Sensation:  (unable to assess) Velum:  (unable to visualize) Mandible:  (minimal jaw opening. Pt able to orally suction himself) Motor Speech Overall Motor Speech:  (unable to assess - no speech elicited) Phonation: Wet;Aphonic;Low vocal intensity           Cyerra Yim B. Dory, MSP, CCC-SLP Speech Language Pathologist Office:  [phone]  Dory Caprice Daring 10/11/2023, 12:37 PM

## 2023-10-11 NOTE — Progress Notes (Signed)
   10/11/23 1807  Vitals  Pulse Rate 93  Resp (!) 32  BP 114/70  SpO2 (!) 77 %  Oxygen Therapy  Patient Activity (if Appropriate) In bed  Pulse Oximetry Type Continuous  During Treatment Monitoring  Blood Flow Rate (mL/min) 100 mL/min  Arterial Pressure (mmHg) -1.21 mmHg  Venous Pressure (mmHg) 348.27 mmHg  TMP (mmHg) 58.38 mmHg  Ultrafiltration Rate (mL/min) 944 mL/min  Dialysate Flow Rate (mL/min) 300 ml/min  Dialysate Potassium Concentration 2  Dialysate Calcium  Concentration 2.5  Duration of HD Treatment -hour(s) 2.4 hour(s)  Cumulative Fluid Removed (mL) per Treatment  1099.01  Intra-Hemodialysis Comments Tx completed   Pt tolerated tx, stable and alert to self, hemostasisx2,

## 2023-10-11 NOTE — TOC Progression Note (Signed)
 Transition of Care [facility]) - Progression Note    Patient Details  Name: Nathan Campbell MRN: [id number] Date of Birth: 10/14/1975  [facility] CM/SW Contact  [name], LCSW Phone Number: 10/11/2023, 3:17 PM  Clinical Narrative:    CSW received request to speak with patient's mother about short term disability. She asked if MD could call patient's job for verification and CSW explained that his job would need to provide her with forms for the MD to sign. She stated she would call them back tomorrow to ask as they directed her to log in to a website but patient cannot help her.    Expected Discharge Plan: IP Rehab Facility Barriers to Discharge: Continued Medical Work up, English as a second language teacher               Expected Discharge Plan and Services     Post Acute Care Choice: IP Rehab Living arrangements for the past 2 months: Single Family Home                                       Social Drivers of Health (SDOH) Interventions SDOH Screenings   Food Insecurity: No Food Insecurity (03/26/2022)  Housing: Low Risk  (03/26/2022)  Transportation Needs: No Transportation Needs (03/26/2022)  Utilities: Not At Risk (03/26/2022)  Tobacco Use: High Risk (10/07/2023)    Readmission Risk Interventions     No data to display

## 2023-10-11 NOTE — Progress Notes (Signed)
   Inpatient Rehab Admissions Coordinator :  Per therapy recommendations patient was screened for CIR candidacy by Heron Leavell RN MSN. Patient is not yet at a level to tolerate the intensity required to pursue a CIR admit. Extubated today. The CIR admissions team will follow and monitor for progress and place a Rehab Consult order if felt to be appropriate. Please contact me with any questions.  Heron Leavell RN MSN Admissions Coordinator [phone]

## 2023-10-11 NOTE — Procedures (Signed)
 Extubation Procedure Note  Patient Details:   Name: Nathan Campbell DOB: 1975/05/26 MRN: [id number]   Airway Documentation:    Vent end date: 10/11/23 Vent end time: 1100   Evaluation  O2 sats: stable throughout Complications: No apparent complications Patient did tolerate procedure well. Bilateral Breath Sounds: Clear, Diminished   Yes   Pt extubated per physician order without complication.  Pt with positive cuff leak. Upon extubation pt able to speak, give a good cough and no stridor heard. Pt placed on 8L HFNC, will wean as tolerated.   Geofm BRAVO Myrene Bougher 10/11/2023, 11:03 AM

## 2023-10-11 NOTE — Progress Notes (Signed)
 [facility] Progress Note   Subjective:    Seen and examined patient on the ICU. Patient is now extubated and sedation is off. Currently with PT. Plan for HD later this afternoon at bedside.  Objective Vitals:   10/11/23 1100 10/11/23 1200 10/11/23 1300 10/11/23 1400  BP: (!) 147/71  130/70 (!) 140/72  Pulse: 82 80 (!) 113 77  Resp: (!) 37 (!) 39 (!) 36 (!) 39  Temp: 99.7 F (37.6 C)  99.5 F (37.5 C)   TempSrc:      SpO2: 98% 98% 91% 100%  Weight:      Height:       Physical Exam General: Awake, following simple commands, on HFNC, NAD Heart: S1 and S2; No murmurs, gallops, or rubs Lungs: Clear anteriorly Abdomen: Soft, round Extremities: No LE edema Dialysis Access: AVF (+) B/T   Filed Weights   10/08/23 1524 10/08/23 1853 10/10/23 0703  Weight: (!) 142.1 kg (!) 138 kg (!) 141.4 kg    Intake/Output Summary (Last 24 hours) at 10/11/2023 1412 Last data filed at 10/11/2023 1400 Gross per 24 hour  Intake 1932.66 ml  Output 52 ml  Net 1880.66 ml    Additional Objective Labs: Basic Metabolic Panel: Recent Labs  Lab 10/09/23 0610 10/10/23 0625 10/11/23 0547  NA 136 132* 136  K 4.7 4.0 4.8  CL 94* 92* 95*  CO2 24 24 23   GLUCOSE 97 108* 151*  BUN 53* 37* 68*  CREATININE 11.53* 7.69* 12.66*  CALCIUM  9.6 9.6 9.6  PHOS 8.8*  --   --    Liver Function Tests: Recent Labs  Lab 10/07/23 0851 10/08/23 0505  AST 15 10*  ALT 13 11  ALKPHOS 82 66  BILITOT 0.6 0.8  PROT 7.2 6.1*  ALBUMIN 3.7 3.1*   No results for input(s): LIPASE, AMYLASE in the last 168 hours. CBC: Recent Labs  Lab 10/07/23 0851 10/07/23 1032 10/08/23 0505 10/10/23 0625 10/11/23 0547  WBC 7.9  --  8.6 11.6* 11.2*  NEUTROABS 5.1  --  6.2 8.5*  --   HGB 11.2*   < > 9.8* 10.6* 9.9*  HCT 34.7*   < > 29.8* 31.8* 30.7*  MCV 99.4  --  98.7 96.7 98.7  PLT 229  --  203 236 233   < > = values in this interval not displayed.   Blood Culture    Component Value Date/Time    SDES TRACHEAL ASPIRATE 10/08/2023 0627   SPECREQUEST NONE 10/08/2023 0627   CULT  10/08/2023 0627    Normal respiratory flora-no Staph aureus or Pseudomonas seen Performed at [facility], [address], [place], [place] [address]    REPTSTATUS 10/10/2023 FINAL 10/08/2023 9372    Cardiac Enzymes: No results for input(s): CKTOTAL, CKMB, CKMBINDEX, TROPONINI in the last 168 hours. CBG: Recent Labs  Lab 10/10/23 1942 10/10/23 2323 10/11/23 0333 10/11/23 0728 10/11/23 1219  GLUCAP 125* 131* 152* 133* 108*   Iron  Studies: No results for input(s): IRON , TIBC, TRANSFERRIN, FERRITIN in the last 72 hours. Lab Results  Component Value Date   INR 1.0 10/07/2023   Studies/Results: DG Chest Port 1 View Result Date: 10/09/2023 CLINICAL DATA:  Acute respiratory failure EXAM: PORTABLE CHEST 1 VIEW COMPARISON:  10/08/2023 FINDINGS: Endotracheal tube and NG tube remain in place, unchanged. Cardiomegaly, vascular congestion. Increasing right infrahilar airspace opacity. Stable left retrocardiac opacity. No effusions or acute bony abnormality. IMPRESSION: Bilateral lower lobe airspace opacities, increasing on the right since prior  study could reflect atelectasis or infiltrates/pneumonia. Cardiomegaly, vascular congestion stable. Electronically Signed   By: Franky Crease M.D.   On: 10/09/2023 15:34    Medications:  ampicillin -sulbactam (UNASYN ) IV Stopped (10/11/23 1107)   feeding supplement (NEPRO CARB STEADY) 55 mL/hr at 10/11/23 1400    amLODipine   10 mg Per Tube Daily   aspirin   300 mg Rectal Daily   Or   aspirin   81 mg Per Tube Daily   carvedilol   6.25 mg Per Tube BID WC   Chlorhexidine  Gluconate Cloth  6 each Topical Q0600   heparin   1,500 Units Intravenous Once   heparin  injection (subcutaneous)  5,000 Units Subcutaneous Q8H   insulin  aspart  0-6 Units Subcutaneous Q4H   isosorbide  dinitrate  5 mg Per Tube BID   labetalol   20 mg Intravenous Once   mouth rinse  15  mL Mouth Rinse 4 times per day   pantoprazole  (PROTONIX ) IV  40 mg Intravenous QHS    Dialysis Orders: SW TTS 4h   B500   138kg   2K bath AVF   Heparin  4000+ Wt gains 3.5- 5 kg on average Usually comes off 2-4kg over Last OP HD 7/24, post wt 142kg   Home bp meds: Norvasc  10 every day Coreg  25 bid Torsemide  60mg  bid  Assessment/Plan: Acute CVA: w/ L sided weakness. S/P TNK 7/25. Also went to NIR. Now extubated and sedation is off. Neurology ESRD: on HD TTS. Had HD last night in ICU. Received HD again overnight (2.1L removed). Next HD later this afternoon. Push UF as tolerated.  HTN: per CCM Volume: close to dry wt, UF 2-3 L w/ next HD Anemia of esrd: Hb 11- 12, no esa needs.   Nathan Piety, NP [facility] 10/11/2023,2:12 PM  LOS: 4 days

## 2023-10-11 NOTE — Progress Notes (Signed)
 eLink Physician-Brief Progress Note Patient Name: Nathan Campbell DOB: [birth year] MRN: [id number]   Date of Service  10/11/2023  HPI/Events of Note  48 year old male with past medical history of obesity, hypertension, end-stage renal disease on HD TTS, combined systolic and diastolic heart failure who presented to the emergency department on 7/25 with left-sided weakness, slurred speech.   Had multiple episodes of vomiting and tube feeds were turned off at the beginning of the shift.  Attempted to give nighttime meds and the patient had another bout of emesis.  No BM for 4 days.  eICU Interventions  Assess for ileus, will obtain KUB   0116 -KUB consistent with ileus, with NG tube, low intermittent suction, bowel rest  Intervention Category Minor Interventions: Routine modifications to care plan (e.g. PRN medications for pain, fever)  Shawneen Deetz 10/11/2023, 10:43 PM

## 2023-10-11 NOTE — Progress Notes (Signed)
 NAME:  Nathan Campbell, MRN:  [id number], DOB:  1975/06/28, LOS: 4 ADMISSION DATE:  10/07/2023, CONSULTATION DATE:  7/25 REFERRING MD: Stroke, CHIEF COMPLAINT: Stroke  History of Present Illness:  48 year old male with past medical history of obesity, hypertension, end-stage renal disease on HD TTS, combined systolic and diastolic heart failure who presented to the emergency department on 7/25 with left-sided weakness, slurred speech.  LKW 0630.  Presented as a code stroke and was greeted by neurology at the bridge. CT head demonstrated acute right MCA infarct, ASPECTS 7.  Additionally he has calcified density along the proximal right M1 segment concerning for calcific embolus. CTA unable to be obtained for restlessness and concern over his airway so he was intubated. Received TNK @ [id number]. Taken to [facility] @ [phone] for angiogram and mechanical intervention.  Pertinent  Medical History  obesity, hypertension, end-stage renal disease on HD TTS, combined systolic and diastolic heart failure  Significant Hospital Events: Including procedures, antibiotic start and stop dates in addition to other pertinent events   7/25: Presented as a code stroke, admit , brought to ICU intubated, PEEP of 10 added 7/26 HD 7/27 tachypneic on pressure support 8/5 >> HD  Interim History / Subjective:   Critically ill, intubated, Calm on Precedex  Down to 50%/PEEP of 5 Low-grade febrile 100.2  Objective   Blood pressure 130/65, pulse 75, temperature 100.2 F (37.9 C), resp. rate (!) 30, height 6' (1.829 m), weight (!) 141.4 kg, SpO2 100%.    Vent Mode: PRVC FiO2 (%):  [50 %] 50 % Set Rate:  [16 bmp-22 bmp] 16 bmp Vt Set:  [620 mL] 620 mL PEEP:  [5 cmH20] 5 cmH20 Plateau Pressure:  [17 cmH20-20 cmH20] 20 cmH20   Intake/Output Summary (Last 24 hours) at 10/11/2023 0958 Last data filed at 10/11/2023 0700 Gross per 24 hour  Intake 1848.27 ml  Output 52 ml  Net 1796.27 ml   Filed Weights   10/08/23 1524 10/08/23  1853 10/10/23 0703  Weight: (!) 142.1 kg (!) 138 kg (!) 141.4 kg    Examination: General: middle aged morbidly obese man, intubated HENT: pupils pinpoint, non reactive; ncat, ett, ogt Lungs: Decreased breath sounds bilateral, no accessory muscle use Cardiovascular: S1-S2 regular, no murmur, no rub Abdomen: Obese, soft Extremities: no pitting edema Neuro: RASS 0, follows one-step commands, left hemiplegia, good strength on right GU: foley  Labs show improved sodium , BUN/creatinine rising again, stable mild leukocytosis, stable anemia  Chest x-ray 7/27 shows vascular congestion, bibasal atelectasis/infiltrate  Resolved Hospital Problem list    Assessment & Plan:  Acute right MCA infarct s/p TNK and NIR mechanical thrombectomy  Left sided weakness and slurred speech. CT w/ acute right MCA infarct s/p TNK and TICI 2b revascularization.  MRI 7/26 shows large right MCA infarct with some mass effect - stroke team primary - Frequent neuro checks -Aspirin  daily - Neuroprotective measures: HOB > 30 degrees, normoglycemia, normothermia, electrolytes WNL - Continue Precedex  until extubated, goal RASS 0  Acute hypoxic and hypercapneic respiratory failure 2/2 above  Intubated to facilitate CT, concern for airway protection in setting of acute stroke.  Persistent hypoxia may be due to pulm hypertension or due to fluid overload, aspiration possible with bibasilar infiltrates, hypoxia improved with HD - daily SAT/SBT , tolerating pressure support 10/5 hopeful to extubate today, may need BiPAP nocturnal - Continue empiric Unasyn  for aspiration, respiratory culture negative - VAP and PAD bundle in place  - titrate FiO2 to sat goal >92  Combined systolic and diastolic heart failure Echo shows enlarged RA/RV, nondiagnostic bubble study  , worsening of pre-existing secondary pulm hypertension Hypertension Home meds amlodipine , carvedilol , isosorbide  mononitrate ,torsemide  60 BID  - SBP Goal  120-140  -resumed carvedilol  at half doses , increase amlodipine  to 10 mg - No response to diuretic, will hold   ESRD on HD TTS Anemia of chronic disease Last dialysis 7/27.  - Renal assisting - strict I&O - Avoid nephrotoxic agents, renally dose medications   Best Practice (right click and Reselect all SmartList Selections daily)   Diet/type: NPO tube feeds  DVT prophylaxis: SCD GI prophylaxis: PPI Lines: NA Foley:  Yes, and it is still needed Code Status:  full code Last date of multidisciplinary goals of care discussion [per primary]  Labs   CBC: Recent Labs  Lab 10/07/23 0851 10/07/23 1032 10/07/23 1551 10/08/23 0505 10/10/23 0625 10/11/23 0547  WBC 7.9  --   --  8.6 11.6* 11.2*  NEUTROABS 5.1  --   --  6.2 8.5*  --   HGB 11.2* 10.5* 9.5* 9.8* 10.6* 9.9*  HCT 34.7* 31.0* 28.0* 29.8* 31.8* 30.7*  MCV 99.4  --   --  98.7 96.7 98.7  PLT 229  --   --  203 236 233    Basic Metabolic Panel: Recent Labs  Lab 10/07/23 0851 10/07/23 1032 10/07/23 1551 10/08/23 0505 10/09/23 0610 10/10/23 0625 10/11/23 0547  NA 140   < > 139 135 136 132* 136  K 4.3   < > 4.3 4.6 4.7 4.0 4.8  CL 98  --   --  99 94* 92* 95*  CO2 24  --   --  22 24 24 23   GLUCOSE 152*  --   --  84 97 108* 151*  BUN 62*  --   --  72* 53* 37* 68*  CREATININE 10.84*  --   --  12.81* 11.53* 7.69* 12.66*  CALCIUM  9.8  --   --  8.6* 9.6 9.6 9.6  MG  --   --   --   --  2.1  --   --   PHOS  --   --   --   --  8.8*  --   --    < > = values in this interval not displayed.   GFR: Estimated Creatinine Clearance: 10.7 mL/min (A) (by C-G formula based on SCr of 12.66 mg/dL (H)). Recent Labs  Lab 10/07/23 0851 10/08/23 0505 10/10/23 0625 10/11/23 0547  WBC 7.9 8.6 11.6* 11.2*    Liver Function Tests: Recent Labs  Lab 10/07/23 0851 10/08/23 0505  AST 15 10*  ALT 13 11  ALKPHOS 82 66  BILITOT 0.6 0.8  PROT 7.2 6.1*  ALBUMIN 3.7 3.1*   No results for input(s): LIPASE, AMYLASE in the  last 168 hours. No results for input(s): AMMONIA in the last 168 hours.  ABG    Component Value Date/Time   PHART 7.424 10/07/2023 1551   PCO2ART 38.2 10/07/2023 1551   PO2ART 70 (L) 10/07/2023 1551   HCO3 25.0 10/07/2023 1551   TCO2 26 10/07/2023 1551   O2SAT 94 10/07/2023 1551     Coagulation Profile: Recent Labs  Lab 10/07/23 0851  INR 1.0    Cardiac Enzymes: No results for input(s): CKTOTAL, CKMB, CKMBINDEX, TROPONINI in the last 168 hours.  HbA1C: Hgb A1c MFr Bld  Date/Time Value Ref Range Status  10/08/2023 05:05 AM 5.1 4.8 - 5.6 % Final  Comment:    (NOTE) Diagnosis of Diabetes The following HbA1c ranges recommended by the American Diabetes Association (ADA) may be used as an aid in the diagnosis of diabetes mellitus.  Hemoglobin             Suggested A1C NGSP%              Diagnosis  <5.7                   Non Diabetic  5.7-6.4                Pre-Diabetic  >6.4                   Diabetic  <7.0                   Glycemic control for                       adults with diabetes.    06/11/2016 10:57 AM 4.7 (L) 4.8 - 5.6 % Final    Comment:    (NOTE)         Pre-diabetes: 5.7 - 6.4         Diabetes: >6.4         Glycemic control for adults with diabetes: <7.0     CBG: Recent Labs  Lab 10/10/23 1552 10/10/23 1942 10/10/23 2323 10/11/23 0333 10/11/23 0728  GLUCAP 101* 125* 131* 152* 133*    Critical care time: 34 m    Eddith Mentor V. [name], MD [facility] 10/11/23 9:58 AM  Please see [email or web address] for pager details.  From 7A-7P if no response, please call [phone] After hours, please call ELink [phone]

## 2023-10-11 NOTE — Progress Notes (Addendum)
 STROKE TEAM PROGRESS NOTE  Hospital events 7/25: Patient admitted with right MCA territory infarct, given TNK and taken to interventional radiology for mechanical thrombectomy 7/29: Patient extubated   INTERIM HISTORY/SUBJECTIVE Patient continues to be hemodynamically stable with Tmax of 100.2.  He has been extubated to high flow nasal cannula.  Neurological exam is unchanged with patient arousable and following commands consistently on the right side.  Able to move the left leg off the bed against gravity but left arm remains plegic  OBJECTIVE  CBC    Component Value Date/Time   WBC 11.2 (H) 10/11/2023 0547   RBC 3.11 (L) 10/11/2023 0547   HGB 9.9 (L) 10/11/2023 0547   HGB 9.2 (L) 01/22/2021 1235   HCT 30.7 (L) 10/11/2023 0547   HCT 28.9 (L) 01/22/2021 1235   PLT 233 10/11/2023 0547   PLT 208 01/22/2021 1235   MCV 98.7 10/11/2023 0547   MCV 93 01/22/2021 1235   MCH 31.8 10/11/2023 0547   MCHC 32.2 10/11/2023 0547   RDW 12.8 10/11/2023 0547   RDW 12.0 01/22/2021 1235   LYMPHSABS 1.1 10/10/2023 0625   LYMPHSABS 0.8 01/22/2021 1235   MONOABS 1.6 (H) 10/10/2023 0625   EOSABS 0.3 10/10/2023 0625   EOSABS 0.1 01/22/2021 1235   BASOSABS 0.0 10/10/2023 0625   BASOSABS 0.0 01/22/2021 1235    BMET    Component Value Date/Time   NA 136 10/11/2023 0547   K 4.8 10/11/2023 0547   CL 95 (L) 10/11/2023 0547   CO2 23 10/11/2023 0547   GLUCOSE 151 (H) 10/11/2023 0547   BUN 68 (H) 10/11/2023 0547   CREATININE 12.66 (H) 10/11/2023 0547   CALCIUM  9.6 10/11/2023 0547   GFRNONAA 4 (L) 10/11/2023 0547    IMAGING past 24 hours No results found.   Vitals:   10/11/23 0700 10/11/23 0831 10/11/23 0900 10/11/23 1100  BP: (!) 155/87 (!) 141/61 130/65   Pulse: 81 72 75   Resp: (!) 23  (!) 30   Temp: 100.2 F (37.9 C)     TempSrc:      SpO2: 92%  100% 98%  Weight:      Height:         PHYSICAL EXAM General:  Sedated CV: Regular rate and rhythm on monitor Respiratory:  Mechanically ventilated, respirations synchronous with ventilator GI: Abdomen soft and nontender   NEURO:  Mental Status: Sedated, intubated, no verbal output.  Is able to follow one step commands, midline and with right upper extremity and bilateral lower extremities Speech/Language:Following commands, sedated  Cranial Nerves:  II:  PERRL III, IV, VI: Right gaze preference but able to cross midline. Eyelids elevate symmetrically.  VII: Obscured by ETT VIII: hearing intact to voice. IX, X: Cough and gag intact KP:Yzji midline  XII: tongue is midline without fasciculations. Motor:  Moves right upper and lower extremity against gravity purposefully, localizes sternal rub.  Able to move left lower extremity against gravity.  Left upper extremity appears plegic and flaccid   Tone: is normal and bulk is normal Sensation- Withdraws in right upper extremity and bilateral lower extremities Coordination: Unable to complete Gait- deferred   ASSESSMENT/PLAN  [name] is a 48 y.o. male with history of obesity, hypertension, end-stage renal disease on HD TTS, combined systolic and diastolic heart failure who presented to the emergency department on 7/25 with left-sided weakness, slurred speech.  NIH on Admission 13. IV TNK after CT head showed what looked like a calcific embolus in the right  MCA and a good ASPECT  score and CT angiogram could not be obtained with emergent cerebral catheter angiogram showed right M1 occlusion and he underwent  TICI 2 b revascularization of the superior and inferior division to the M2 region but was left intubated for medical issues given history of CHF and renal failure   Acute Ischemic Infarct:  right MCA territory infarct  s/p mechanical thrombectomy and TNK Etiology: Embolic versus large vessel disease Code Stroke CT head - Findings consistent with acute right MCA territory ischemic changes, ASPECT score 7. And calcific density is noted in the proximal  right M1 segment. This is most concerning for a calcific embolus. Intracranial atherosclerotic changes considered less likely. MRI   Large right MCA territory infarct with associated diffuse cortical thickening and some mass effect MRA  Distal right M2 occlusion, somewhat more proximal than on the final angio images. No new occlusion is present. Repeat CT Head-  Expected evolution of the large right MCA territory infarct with effacement of the sulci and hyperdense thrombus within sylvian branches of the right MCA. No significant midline shift. 2D Echo LA mildly dilated, right atria fairly dilated.  EF 50-55% LDL 85 HgbA1c 5.1 VTE prophylaxis -heparin  No antithrombotic prior to admission, now on aspirin  81 mg daily and aspirin  300 mg suppository daily  Therapy recommendations:  Pending Disposition:  Neuro ICU  S/p thrombectomy of R M1 with TICI 2B revascularization  Intubated prior to procedure due to agitation   Acute hypoxic and hypercapnic respiratory failure extubated to high flow nasal cannula   Combined systolic and diastolic heart failure Hypertension Pulmonary hypertension Home meds: Amlodipine , isosorbide  mononitrate, furosemide , carvedilol  Resume GDMT when BP stabilized Stable Blood Pressure Goal: BP less than 180/105   Hyperlipidemia LDL 85 goal < 70 Add atorvastatin Continue statin at discharge  Elevated triglycerides [phone] Propofol  switched to Precedex   Dysphagia NPO Tube feeding started  ESRD on HD Nephrology consulted HD 7/26, next treatment 7/29   Hospital day # 4  Patient seen and examined by NP/APP with MD. MD to update note as needed.   Cortney E Everitt Clint Kill , MSN, AGACNP-BC [facility] See Amion for schedule and pager information 10/11/2023 11:17 AM   I have personally obtained history,examined this patient, reviewed notes, independently viewed imaging studies, participated in medical decision making and plan of care.ROS  completed by me personally and pertinent positives fully documented  I have made any additions or clarifications directly to the above note. Agree with note above.  Patient with embolic right MCA infarct status post thrombectomy with significant residual hemiplegia.  Patient was extubated today..  Continue close monitoring of neurological and respiratory status.  Speech therapy for swallow eval.  Will likely need prolonged cardiac monitoring at discharge for paroxysmal A-fib.  No family available at the bedside today.  Continue aspirin  for stroke prevention for now discussed with Dr. Alva critical care medicine. This patient is critically ill and at significant risk of neurological worsening, death and care requires constant monitoring of vital signs, hemodynamics,respiratory and cardiac monitoring, extensive review of multiple databases, frequent neurological assessment, discussion with family, other specialists and medical decision making of high complexity.I have made any additions or clarifications directly to the above note.This critical care time does not reflect procedure time, or teaching time or supervisory time of PA/NP/Med Resident etc but could involve care discussion time.  I spent 30 minutes of neurocritical care time  in the care of  this patient.  Eather Popp, MD Medical Director [facility] Pager: [phone] 10/11/2023 4:32 PM

## 2023-10-12 ENCOUNTER — Inpatient Hospital Stay (HOSPITAL_COMMUNITY)

## 2023-10-12 DIAGNOSIS — I63411 Cerebral infarction due to embolism of right middle cerebral artery: Secondary | ICD-10-CM | POA: Diagnosis not present

## 2023-10-12 DIAGNOSIS — I779 Disorder of arteries and arterioles, unspecified: Secondary | ICD-10-CM | POA: Diagnosis not present

## 2023-10-12 DIAGNOSIS — I63512 Cerebral infarction due to unspecified occlusion or stenosis of left middle cerebral artery: Secondary | ICD-10-CM

## 2023-10-12 DIAGNOSIS — R1310 Dysphagia, unspecified: Secondary | ICD-10-CM | POA: Diagnosis not present

## 2023-10-12 DIAGNOSIS — N186 End stage renal disease: Secondary | ICD-10-CM | POA: Diagnosis not present

## 2023-10-12 DIAGNOSIS — J9601 Acute respiratory failure with hypoxia: Secondary | ICD-10-CM | POA: Diagnosis not present

## 2023-10-12 DIAGNOSIS — E785 Hyperlipidemia, unspecified: Secondary | ICD-10-CM | POA: Diagnosis not present

## 2023-10-12 DIAGNOSIS — J9602 Acute respiratory failure with hypercapnia: Secondary | ICD-10-CM | POA: Diagnosis not present

## 2023-10-12 DIAGNOSIS — I5043 Acute on chronic combined systolic (congestive) and diastolic (congestive) heart failure: Secondary | ICD-10-CM

## 2023-10-12 LAB — CBC
HCT: 32.1 % — ABNORMAL LOW (ref 39.0–52.0)
Hemoglobin: 10.5 g/dL — ABNORMAL LOW (ref 13.0–17.0)
MCH: 32.5 pg (ref 26.0–34.0)
MCHC: 32.7 g/dL (ref 30.0–36.0)
MCV: 99.4 fL (ref 80.0–100.0)
Platelets: 261 K/uL (ref 150–400)
RBC: 3.23 MIL/uL — ABNORMAL LOW (ref 4.22–5.81)
RDW: 13 % (ref 11.5–15.5)
WBC: 13.6 K/uL — ABNORMAL HIGH (ref 4.0–10.5)
nRBC: 0 % (ref 0.0–0.2)

## 2023-10-12 LAB — POCT I-STAT 7, (LYTES, BLD GAS, ICA,H+H)
Acid-Base Excess: 2 mmol/L (ref 0.0–2.0)
Bicarbonate: 26.4 mmol/L (ref 20.0–28.0)
Calcium, Ion: 1.14 mmol/L — ABNORMAL LOW (ref 1.15–1.40)
HCT: 30 % — ABNORMAL LOW (ref 39.0–52.0)
Hemoglobin: 10.2 g/dL — ABNORMAL LOW (ref 13.0–17.0)
O2 Saturation: 94 %
Potassium: 3.9 mmol/L (ref 3.5–5.1)
Sodium: 137 mmol/L (ref 135–145)
TCO2: 28 mmol/L (ref 22–32)
pCO2 arterial: 40.2 mmHg (ref 32–48)
pH, Arterial: 7.425 (ref 7.35–7.45)
pO2, Arterial: 71 mmHg — ABNORMAL LOW (ref 83–108)

## 2023-10-12 LAB — GLUCOSE, CAPILLARY
Glucose-Capillary: 100 mg/dL — ABNORMAL HIGH (ref 70–99)
Glucose-Capillary: 100 mg/dL — ABNORMAL HIGH (ref 70–99)
Glucose-Capillary: 106 mg/dL — ABNORMAL HIGH (ref 70–99)
Glucose-Capillary: 107 mg/dL — ABNORMAL HIGH (ref 70–99)
Glucose-Capillary: 126 mg/dL — ABNORMAL HIGH (ref 70–99)
Glucose-Capillary: 127 mg/dL — ABNORMAL HIGH (ref 70–99)

## 2023-10-12 LAB — BASIC METABOLIC PANEL WITH GFR
Anion gap: 19 — ABNORMAL HIGH (ref 5–15)
BUN: 60 mg/dL — ABNORMAL HIGH (ref 6–20)
CO2: 23 mmol/L (ref 22–32)
Calcium: 10 mg/dL (ref 8.9–10.3)
Chloride: 94 mmol/L — ABNORMAL LOW (ref 98–111)
Creatinine, Ser: 12.03 mg/dL — ABNORMAL HIGH (ref 0.61–1.24)
GFR, Estimated: 5 mL/min — ABNORMAL LOW (ref >60)
Glucose, Bld: 122 mg/dL — ABNORMAL HIGH (ref 70–99)
Potassium: 4.1 mmol/L (ref 3.5–5.1)
Sodium: 136 mmol/L (ref 135–145)

## 2023-10-12 MED ORDER — LACTULOSE 10 GM/15ML PO SOLN
20.0000 g | Freq: Once | ORAL | Status: AC
  Administered 2023-10-12: 20 g
  Filled 2023-10-12: qty 30

## 2023-10-12 MED ORDER — ORAL CARE MOUTH RINSE
15.0000 mL | OROMUCOSAL | Status: DC
  Administered 2023-10-12 – 2023-10-18 (×21): 15 mL via OROMUCOSAL

## 2023-10-12 MED ORDER — METOCLOPRAMIDE HCL 5 MG/ML IJ SOLN
5.0000 mg | Freq: Four times a day (QID) | INTRAMUSCULAR | Status: AC
  Administered 2023-10-12 – 2023-10-13 (×6): 5 mg via INTRAVENOUS
  Filled 2023-10-12 (×6): qty 2

## 2023-10-12 MED ORDER — CHLORHEXIDINE GLUCONATE CLOTH 2 % EX PADS
6.0000 | MEDICATED_PAD | Freq: Every day | CUTANEOUS | Status: DC
  Administered 2023-10-13 – 2023-10-18 (×6): 6 via TOPICAL

## 2023-10-12 MED ORDER — HYDRALAZINE HCL 20 MG/ML IJ SOLN
10.0000 mg | INTRAMUSCULAR | Status: DC | PRN
  Administered 2023-10-12 – 2023-10-13 (×6): 20 mg via INTRAVENOUS
  Administered 2023-10-13: 30 mg via INTRAVENOUS
  Filled 2023-10-12 (×4): qty 1
  Filled 2023-10-12: qty 2
  Filled 2023-10-12: qty 1

## 2023-10-12 MED ORDER — ORAL CARE MOUTH RINSE: 15.0000 mL | OROMUCOSAL | Status: DC | PRN

## 2023-10-12 NOTE — Progress Notes (Signed)
 ABG obtained on 10L [place].    Latest Reference Range & Units 10/12/23 14:26  Sample type  ARTERIAL  pH, Arterial 7.35 - 7.45  7.425  pCO2 arterial 32 - 48 mmHg 40.2  pO2, Arterial 83 - 108 mmHg 71 (L)  TCO2 22 - 32 mmol/L 28  Acid-Base Excess 0.0 - 2.0 mmol/L 2.0  Bicarbonate 20.0 - 28.0 mmol/L 26.4  O2 Saturation % 94  (L): Data is abnormally low

## 2023-10-12 NOTE — Progress Notes (Signed)
 PT Cancellation Note  Patient Details Name: Nathan Campbell MRN: [id number] DOB: [birth year]   Cancelled Treatment:    Reason Eval/Treat Not Completed: Medical issues which prohibited therapy  RN reports decr respiratory status with recently placed on HHFNC. Will continue to follow.    Macario RAMAN, PT Acute Rehabilitation Services  Office [phone]  Macario SHAUNNA Soja 10/12/2023, 9:18 AM

## 2023-10-12 NOTE — Progress Notes (Signed)
 Pt placed on HHFNC 60L 40% following possible aspiration event overnight. Pt has an increased WOB with a RR >35bpm on NRB.     10/12/23 0756  Therapy Vitals  Pulse Rate 99  Resp (!) 30  Patient Position (if appropriate) Lying  MEWS Score/Color  MEWS Score 2  MEWS Score Color Yellow  Oxygen Therapy/Pulse Ox  O2 Device HHFNC  $ Heated High Flow Nasal Cannula  Yes  Heated High Flow Nasal Cannula  Adult Large  $ Adult Large Yes  O2 Therapy Oxygen humidified  Heater temperature 98.6 F (37 C)  O2 Flow Rate (L/min) 60 L/min  FiO2 (%) 40 %  SpO2 97 %

## 2023-10-12 NOTE — Progress Notes (Signed)
 NAME:  Nathan Campbell, MRN:  [id number], DOB:  08/15/1975, LOS: 5 ADMISSION DATE:  10/07/2023, CONSULTATION DATE:  7/25 REFERRING MD: Stroke, CHIEF COMPLAINT: Stroke  History of Present Illness:  48 year old male with past medical history of obesity, hypertension, end-stage renal disease on HD TTS, combined systolic and diastolic heart failure who presented to the emergency department on 7/25 with left-sided weakness, slurred speech.  LKW 0630.  Presented as a code stroke and was greeted by neurology at the bridge. CT head demonstrated acute right MCA infarct, ASPECTS 7.  Additionally he has calcified density along the proximal right M1 segment concerning for calcific embolus. CTA unable to be obtained for restlessness and concern over his airway so he was intubated. Received TNK @ [id number]. Taken to [facility] @ [phone] for angiogram and mechanical intervention.  Pertinent  Medical History  obesity, hypertension, end-stage renal disease on HD TTS, combined systolic and diastolic heart failure  Significant Hospital Events: Including procedures, antibiotic start and stop dates in addition to other pertinent events   7/25: Presented as a code stroke, admit , brought to ICU intubated, PEEP of 10 added 7/26 HD 7/27 tachypneic on pressure support 8/5 >> HD 7/29 extubated  Interim History / Subjective:   Extubated yesterday and seems to be doing well. Overnight had vomiting and aspirated -NG tube placed and put on suction X-ray KUB showed ileus Placed on high heated high flow nasal cannula this morning Febrile 101 yesterday, defervesced this morning  Objective   Blood pressure 116/64, pulse 86, temperature 99.5 F (37.5 C), temperature source Axillary, resp. rate (!) 33, height 6' (1.829 m), weight (!) 136.6 kg, SpO2 97%.    FiO2 (%):  [40 %] 40 %   Intake/Output Summary (Last 24 hours) at 10/12/2023 0923 Last data filed at 10/12/2023 0700 Gross per 24 hour  Intake 595.14 ml  Output 2100 ml  Net  -1504.86 ml   Filed Weights   10/08/23 1853 10/10/23 0703 10/12/23 0600  Weight: (!) 138 kg (!) 141.4 kg (!) 136.6 kg    Examination: General: middle aged morbidly obese man, on HHFNC HENT: pupils pinpoint, non reactive; ncat,  ogt Lungs: Decreased breath sounds bilateral, mild tachypnea Cardiovascular: S1-S2 regular, no murmur, no rub Abdomen: Obese, soft Extremities: no pitting edema Neuro: Calm follows one-step commands, left hemiplegia, good strength on right GU: foley  Labs show mild leukocytosis, stable anemia  Chest x-ray 7/30 hazy left lower lobe infiltrate with improved aeration  X-ray abdomen shows ileus  Resolved Hospital Problem list    Assessment & Plan:  Acute right MCA infarct s/p TNK and NIR mechanical thrombectomy  Left sided weakness and slurred speech. CT w/ acute right MCA infarct s/p TNK and TICI 2b revascularization.  MRI 7/26 shows large right MCA infarct with some mass effect - stroke team primary - Frequent neuro checks -Aspirin  daily - Neuroprotective measures: HOB > 30 degrees, normoglycemia, normothermia, electrolytes WNL   Acute hypoxic and hypercapneic respiratory failure 2/2 above  Intubated to facilitate CT, concern for airway protection in setting of acute stroke.  Persistent hypoxic may be due to pulm hypertension or due to fluid overload, aspiration possible with bibasilar infiltrates,  Extubated 7/29 then worsened due to aspiration - HHFNC, Taper FiO2 as tolerated - Continue empiric Unasyn  for aspiration, respiratory culture negative - VAP and PAD bundle in place  - titrate FiO2 to sat goal >92    Combined systolic and diastolic heart failure Echo shows enlarged RA/RV, nondiagnostic bubble study  ,  worsening of pre-existing secondary pulm hypertension Hypertension Home meds amlodipine , carvedilol , isosorbide  mononitrate ,torsemide  60 BID  - SBP Goal 160 and lower -resumed carvedilol  at half doses , increase amlodipine  to 10 mg -  IV labetalol  and hydralazine  as needed   ESRD on HD TTS Anemia of chronic disease Last dialysis 7/29.  - Renal assisting - strict I&O - Avoid nephrotoxic agents, renally dose medications  Ileus -stop tube feeds NG to suction Reglan  5 mg every 6 x 6 doses Okay to use core track for meds   Best Practice (right click and Reselect all SmartList Selections daily)   Diet/type: NPO  DVT prophylaxis: SCD GI prophylaxis: PPI Lines: NA Foley:  Yes, and it is still needed Code Status:  full code Last date of multidisciplinary goals of care discussion [per primary]  Labs   CBC: Recent Labs  Lab 10/07/23 0851 10/07/23 1032 10/07/23 1551 10/08/23 0505 10/10/23 0625 10/11/23 0547 10/12/23 0605  WBC 7.9  --   --  8.6 11.6* 11.2* 13.6*  NEUTROABS 5.1  --   --  6.2 8.5*  --   --   HGB 11.2*   < > 9.5* 9.8* 10.6* 9.9* 10.5*  HCT 34.7*   < > 28.0* 29.8* 31.8* 30.7* 32.1*  MCV 99.4  --   --  98.7 96.7 98.7 99.4  PLT 229  --   --  203 236 233 261   < > = values in this interval not displayed.    Basic Metabolic Panel: Recent Labs  Lab 10/08/23 0505 10/09/23 0610 10/10/23 0625 10/11/23 0547 10/12/23 0605  NA 135 136 132* 136 136  K 4.6 4.7 4.0 4.8 4.1  CL 99 94* 92* 95* 94*  CO2 22 24 24 23 23   GLUCOSE 84 97 108* 151* 122*  BUN 72* 53* 37* 68* 60*  CREATININE 12.81* 11.53* 7.69* 12.66* 12.03*  CALCIUM  8.6* 9.6 9.6 9.6 10.0  MG  --  2.1  --   --   --   PHOS  --  8.8*  --   --   --    GFR: Estimated Creatinine Clearance: 11.1 mL/min (A) (by C-G formula based on SCr of 12.03 mg/dL (H)). Recent Labs  Lab 10/08/23 0505 10/10/23 0625 10/11/23 0547 10/12/23 0605  WBC 8.6 11.6* 11.2* 13.6*    Liver Function Tests: Recent Labs  Lab 10/07/23 0851 10/08/23 0505  AST 15 10*  ALT 13 11  ALKPHOS 82 66  BILITOT 0.6 0.8  PROT 7.2 6.1*  ALBUMIN 3.7 3.1*   No results for input(s): LIPASE, AMYLASE in the last 168 hours. No results for input(s): AMMONIA in the  last 168 hours.  ABG    Component Value Date/Time   PHART 7.424 10/07/2023 1551   PCO2ART 38.2 10/07/2023 1551   PO2ART 70 (L) 10/07/2023 1551   HCO3 25.0 10/07/2023 1551   TCO2 26 10/07/2023 1551   O2SAT 94 10/07/2023 1551     Coagulation Profile: Recent Labs  Lab 10/07/23 0851  INR 1.0    Cardiac Enzymes: No results for input(s): CKTOTAL, CKMB, CKMBINDEX, TROPONINI in the last 168 hours.  HbA1C: Hgb A1c MFr Bld  Date/Time Value Ref Range Status  10/08/2023 05:05 AM 5.1 4.8 - 5.6 % Final    Comment:    (NOTE) Diagnosis of Diabetes The following HbA1c ranges recommended by the American Diabetes Association (ADA) may be used as an aid in the diagnosis of diabetes mellitus.  Hemoglobin  Suggested A1C NGSP%              Diagnosis  <5.7                   Non Diabetic  5.7-6.4                Pre-Diabetic  >6.4                   Diabetic  <7.0                   Glycemic control for                       adults with diabetes.    06/11/2016 10:57 AM 4.7 (L) 4.8 - 5.6 % Final    Comment:    (NOTE)         Pre-diabetes: 5.7 - 6.4         Diabetes: >6.4         Glycemic control for adults with diabetes: <7.0     CBG: Recent Labs  Lab 10/11/23 1555 10/11/23 1940 10/11/23 2340 10/12/23 0326 10/12/23 0748  GLUCAP 120* 119* 120* 100* 126*    Critical care time: 32 m    Alexes Lamarque V. [name], MD [facility] 10/12/23 9:23 AM  Please see [email or web address] for pager details.  From 7A-7P if no response, please call [phone] After hours, please call ELink [phone]

## 2023-10-12 NOTE — Progress Notes (Signed)
 OT Cancellation Note  Patient Details Name: Nathan Campbell MRN: [id number] DOB: 1975/11/23   Cancelled Treatment:    Reason Eval/Treat Not Completed: Medical issues which prohibited therapy (pt with decr respiratory status, RN reporting pt back on HHFNC. Will hold at this time and follow up for OT tx as appropriate.)  [name] K, OTD, OTR/L [facility] [phone]   [name] 10/12/2023, 8:34 AM

## 2023-10-12 NOTE — Progress Notes (Addendum)
 STROKE TEAM PROGRESS NOTE  Hospital events 7/25: Patient admitted with right MCA territory infarct, given TNK and taken to interventional radiology for mechanical thrombectomy 7/29: Patient extubated 7/29: Ileus, tachypnea, now on heated high flow  INTERIM HISTORY/SUBJECTIVE Patient was extubated yesterday and did well initially but overnight developed episodes of vomiting and diarrhea and tachypnea and abdominal distention with x-rays confirming ileus.  Put on heated high flow; unable to take PO medications. Cortrak in place.   OBJECTIVE  CBC    Component Value Date/Time   WBC 13.6 (H) 10/12/2023 0605   RBC 3.23 (L) 10/12/2023 0605   HGB 10.5 (L) 10/12/2023 0605   HGB 9.2 (L) 01/22/2021 1235   HCT 32.1 (L) 10/12/2023 0605   HCT 28.9 (L) 01/22/2021 1235   PLT 261 10/12/2023 0605   PLT 208 01/22/2021 1235   MCV 99.4 10/12/2023 0605   MCV 93 01/22/2021 1235   MCH 32.5 10/12/2023 0605   MCHC 32.7 10/12/2023 0605   RDW 13.0 10/12/2023 0605   RDW 12.0 01/22/2021 1235   LYMPHSABS 1.1 10/10/2023 0625   LYMPHSABS 0.8 01/22/2021 1235   MONOABS 1.6 (H) 10/10/2023 0625   EOSABS 0.3 10/10/2023 0625   EOSABS 0.1 01/22/2021 1235   BASOSABS 0.0 10/10/2023 0625   BASOSABS 0.0 01/22/2021 1235    BMET    Component Value Date/Time   NA 136 10/12/2023 0605   K 4.1 10/12/2023 0605   CL 94 (L) 10/12/2023 0605   CO2 23 10/12/2023 0605   GLUCOSE 122 (H) 10/12/2023 0605   BUN 60 (H) 10/12/2023 0605   CREATININE 12.03 (H) 10/12/2023 0605   CALCIUM  10.0 10/12/2023 0605   GFRNONAA 5 (L) 10/12/2023 0605    IMAGING past 24 hours DG Chest Port 1 View Result Date: 10/12/2023 CLINICAL DATA:  Acute respiratory failure with hypoxemia. EXAM: PORTABLE CHEST 1 VIEW COMPARISON:  10/09/2023 FINDINGS: Patient is slightly rotated to the right. Interval removal endotracheal tube. Two enteric tubes course into the region of the stomach and off the film as tips are not definitely visualized. Lungs are  hypoinflated with continued mild hazy opacification over the left base with slight interval improved aeration. Mild prominence of the central pulmonary vessels suggesting a degree of vascular congestion without significant change. Stable cardiomegaly. Remainder of the exam is unchanged. IMPRESSION: 1. Interval removal of endotracheal tube. 2. Hypoinflation with continued mild hazy opacification over the left base with slight interval improved aeration. 3. Stable cardiomegaly with mild vascular congestion. Electronically Signed   By: Toribio Agreste M.D.   On: 10/12/2023 08:16   DG Abd Portable 1V Result Date: 10/12/2023 CLINICAL DATA:  Check gastric catheter placement EXAM: PORTABLE ABDOMEN - 1 VIEW COMPARISON:  10/11/2023 FINDINGS: Previously seen feeding catheter is again noted. Gastric catheter is now seen within the distal stomach as well. Scattered large and small bowel gas remains consistent with ileus. IMPRESSION: Gastric catheter within the distal stomach. Electronically Signed   By: Oneil Devonshire M.D.   On: 10/12/2023 02:41   DG Abd 1 View Result Date: 10/11/2023 CLINICAL DATA:  Check feeding catheter placement, follow-up ileus EXAM: ABDOMEN - 1 VIEW COMPARISON:  None Available. FINDINGS: Feeding catheter is noted in the distal stomach directed towards the duodenum. Diffuse gaseous distension of the large and small bowel is noted likely representing a generalized ileus. No free air is seen. No bony abnormality is noted. IMPRESSION: Changes consistent with generalized ileus. Feeding catheter is noted in the distal stomach. Electronically Signed   By:  Oneil Devonshire M.D.   On: 10/11/2023 23:35     Vitals:   10/12/23 0718 10/12/23 0730 10/12/23 0749 10/12/23 0756  BP: (!) 191/90 (!) 192/86 (!) 197/87   Pulse: (!) 101 96 99 99  Resp: 17 (!) 48 (!) 44 (!) 30  Temp:      TempSrc:      SpO2: (!) 89% 100% 98% 97%  Weight:      Height:         PHYSICAL EXAM General: Obese middle-aged  African-American male.  Mild respiratory distress on high flow nasal cannula oxygen  CV: Regular rate and rhythm on monitor Respiratory: Mechanically ventilated, respirations synchronous with ventilator GI: Abdomen soft and nontender   NEURO:  Mental Status: Able to follow one step commands, midline and with right upper extremity and bilateral lower extremities Speech/Language:Following commands. No verbal output  Cranial Nerves:  II:  PERRL III, IV, VI: Right gaze preference but able to cross midline. Eyelids elevate symmetrically.  VII:  VIII: hearing intact to voice. IX, X: Cough and gag intact KP:Yzji midline  XII: tongue is midline without fasciculations. Motor:  Moves right upper and lower extremity against gravity purposefully, localizes sternal rub.  Able to move left lower extremity against gravity.  Left upper extremity appears plegic and flaccid   Right upper extremity is purposeful.  Right lower extremity purposeful, left lower extremity purposeful with drift  Tone: is normal and bulk is normal Sensation- Withdraws in right upper extremity and bilateral lower extremities Coordination: no gross ataxia on the right Gait- deferred   ASSESSMENT/PLAN  Mr. Ranferi Clingan is a 48 y.o. male with history of obesity, hypertension, end-stage renal disease on HD TTS, combined systolic and diastolic heart failure who presented to the emergency department on 7/25 with left-sided weakness, slurred speech.  NIH on Admission 13. IV TNK after CT head showed what looked like a calcific embolus in the right MCA and a good ASPECT  score and CT angiogram could not be obtained with emergent cerebral catheter angiogram showed right M1 occlusion and he underwent  TICI 2 b revascularization of the superior and inferior division to the M2 region but was left intubated for medical issues given history of CHF and renal failure   Acute Ischemic Infarct:  right MCA territory infarct  s/p mechanical  thrombectomy and TNK Etiology: Embolic versus large vessel disease Code Stroke CT head - Findings consistent with acute right MCA territory ischemic changes, ASPECT score 7. And calcific density is noted in the proximal right M1 segment. This is most concerning for a calcific embolus. Intracranial atherosclerotic changes considered less likely. MRI   Large right MCA territory infarct with associated diffuse cortical thickening and some mass effect MRA  Distal right M2 occlusion, somewhat more proximal than on the final angio images. No new occlusion is present. Repeat CT Head-  Expected evolution of the large right MCA territory infarct with effacement of the sulci and hyperdense thrombus within sylvian branches of the right MCA. No significant midline shift. 2D Echo LA mildly dilated, right atria fairly dilated.  EF 50-55% LDL 85 HgbA1c 5.1 VTE prophylaxis -heparin  No antithrombotic prior to admission, now on aspirin  81 mg daily and aspirin  300 mg suppository daily  Therapy recommendations:  Pending Disposition:  Neuro ICU  S/p thrombectomy of R M1 with TICI 2B revascularization  Intubated prior to procedure due to agitation   Acute hypoxic and hypercapnic respiratory failure extubated to high flow nasal cannula  Combined systolic  and diastolic heart failure Hypertension Pulmonary hypertension Home meds: Amlodipine , isosorbide  mononitrate, furosemide , carvedilol  Resume GDMT when BP stabilized Stable Blood Pressure Goal: BP less than 180/105   Hyperlipidemia LDL 85 goal < 70 Add atorvastatin Continue statin at discharge  Elevated triglycerides [phone]  Dysphagia NPO Tube feeding started  Ileus  NG to suction Reglan  5mg  q6 TF on hold, meds still being given  ESRD on HD Nephrology consulted HD 7/26, next treatment 7/29   Hospital day # 5  Patient seen and examined by NP/APP with MD. MD to update note as needed.   Jorene Last, DNP, FNP-BC [facility] Pager: [phone]  I have personally obtained history,examined this patient, reviewed notes, independently viewed imaging studies, participated in medical decision making and plan of care.ROS completed by me personally and pertinent positives fully documented  I have made any additions or clarifications directly to the above note. Agree with note above.  Patient was extubated yesterday but developed nausea vomiting and abdominal distention due to ileus respiratory distress.  Continue ventilator support and close observation as per critical care team.  Patient may need reintubation if respiratory failure or if it worsens.  No family available at the bedside for discussion.  Discussed with Dr. Jeryl critical care medicine. This patient is critically ill and at significant risk of neurological worsening, death and care requires constant monitoring of vital signs, hemodynamics,respiratory and cardiac monitoring, extensive review of multiple databases, frequent neurological assessment, discussion with family, other specialists and medical decision making of high complexity.I have made any additions or clarifications directly to the above note.This critical care time does not reflect procedure time, or teaching time or supervisory time of PA/NP/Med Resident etc but could involve care discussion time.  I spent 30 minutes of neurocritical care time  in the care of  this patient.      Eather Popp, MD Medical Director [facility] Pager: [phone] 10/12/2023 2:40 PM

## 2023-10-12 NOTE — Progress Notes (Signed)
 Nutrition Follow-up  DOCUMENTATION CODES:   Obesity unspecified  INTERVENTION:   If remains NPO and ileus resolves recommend initiate tube feeding via Cortrak tube: If pt with ileus recommend initiate lower fat/fiber formula - Osmolite 1.5 at 25 ml/h and increase by 10 ml every 8 hours to goal rate of 65 ml/hr (1560 ml per day)  Prosource TF20 60 ml daily  Provides 2420 kcal, 117 gm protein, 1185 ml free water daily   NUTRITION DIAGNOSIS:   Inadequate oral intake related to inability to eat as evidenced by NPO status. Ongoing.   GOAL:   Patient will meet greater than or equal to 90% of their needs Not met due to ileus  MONITOR:   TF tolerance  REASON FOR ASSESSMENT:   Consult Enteral/tube feeding initiation and management  ASSESSMENT:   Pt with PMH of obesity, HTN, end-stage renal dz on HD TTS, combined systolic and diastolic heart failure admitted 7/25 with L-sided weakness dx with acute R MCA infarct.   Pt discussed during ICU rounds and with RN and MD.  EDW 138 kg Noted last outpatient post HD weight: 142 kg  Spoke with pt who reports no abd pain but did just have a bowel movement.   Pt extubated 7/29, overnight began having episodes of nausea/vomiting/diarrhea. Ileus confirmed by xray. Pt requiring 10 L O2 via [place].   7/25 - admitted acute R MCA s/p TNK and mechanical thrombectomy; intubated 7/27 - TF protocol started 7/28 - cortrak placed; tip gastric  7/29 - extubated  7/30 - NG tube placed for decompression; gastric per xray   Medications reviewed and include: SSI every 4 hours, protonix    Labs reviewed:  BUN 60 Cr 12.03 Phos 8.8 (7/27) A1C 5.1 CBG: 100-152  UF: 1100 ml  UOP 0 recorded x 24 hours Stool: 850 ml      Diet Order:   Diet Order             Diet NPO time specified  Diet effective now                   EDUCATION NEEDS:   Not appropriate for education at this time  Skin:  Skin Assessment: Reviewed RN  Assessment  Last BM:  unknown  Height:   Ht Readings from Last 1 Encounters:  10/10/23 6' (1.829 m)    Weight:   Wt Readings from Last 1 Encounters:  10/12/23 (!) 136.6 kg    BMI:  Body mass index is 40.84 kg/m.  Estimated Nutritional Needs:   Kcal:  2300-2500  Protein:  100-120 grams  Fluid:  1.2 L/day  Powell SQUIBB., RD, LDN, CNSC See AMiON for contact information

## 2023-10-12 NOTE — Progress Notes (Signed)
 Patient breathing in 30s. Still somnolent but arousable. ABG wnl. Will cont on salter [place] for now. Mother at bedside updated. She states if breathing worsens is okay with re-intubation and would like to remain full code.   JD Emilio RIGGERS [facility] 10/12/2023, 3:54 PM  Please see [email or web address] for pager details.  From 7A-7P if no response, please call [phone]. After hours, please call ELink [phone].

## 2023-10-12 NOTE — Progress Notes (Signed)
 [facility] Progress Note   Subjective:    Seen and examined patient at bedside. Patient extubated 7/29. Noted he vomited overnight and aspirated. Remains on HFNC with a NGT. Tolerated yesterday's HD with net UF 1.1L. Next HD 7/31.  Objective Vitals:   10/12/23 0800 10/12/23 0900 10/12/23 1000 10/12/23 1048  BP: 116/64 124/67 (!) 177/94   Pulse: 86 87 98 97  Resp: (!) 33 (!) 39 (!) 40 (!) 28  Temp: 99.5 F (37.5 C)     TempSrc: Axillary     SpO2: 97% 100% 100% 100%  Weight:      Height:       Physical Exam General: Awake, following simple commands, on HFNC, NAD HEENT: NGT in place Heart: S1 and S2; No murmurs, gallops, or rubs Lungs: Clear anteriorly Abdomen: Soft, round Extremities: No LE edema Dialysis Access: AVF (+) B/T   Filed Weights   10/08/23 1853 10/10/23 0703 10/12/23 0600  Weight: (!) 138 kg (!) 141.4 kg (!) 136.6 kg    Intake/Output Summary (Last 24 hours) at 10/12/2023 1133 Last data filed at 10/12/2023 1000 Gross per 24 hour  Intake 443.41 ml  Output 2100 ml  Net -1656.59 ml    Additional Objective Labs: Basic Metabolic Panel: Recent Labs  Lab 10/09/23 0610 10/10/23 0625 10/11/23 0547 10/12/23 0605  NA 136 132* 136 136  K 4.7 4.0 4.8 4.1  CL 94* 92* 95* 94*  CO2 24 24 23 23   GLUCOSE 97 108* 151* 122*  BUN 53* 37* 68* 60*  CREATININE 11.53* 7.69* 12.66* 12.03*  CALCIUM  9.6 9.6 9.6 10.0  PHOS 8.8*  --   --   --    Liver Function Tests: Recent Labs  Lab 10/07/23 0851 10/08/23 0505  AST 15 10*  ALT 13 11  ALKPHOS 82 66  BILITOT 0.6 0.8  PROT 7.2 6.1*  ALBUMIN 3.7 3.1*   No results for input(s): LIPASE, AMYLASE in the last 168 hours. CBC: Recent Labs  Lab 10/07/23 0851 10/07/23 1032 10/08/23 0505 10/10/23 0625 10/11/23 0547 10/12/23 0605  WBC 7.9  --  8.6 11.6* 11.2* 13.6*  NEUTROABS 5.1  --  6.2 8.5*  --   --   HGB 11.2*   < > 9.8* 10.6* 9.9* 10.5*  HCT 34.7*   < > 29.8* 31.8* 30.7* 32.1*  MCV 99.4  --   98.7 96.7 98.7 99.4  PLT 229  --  203 236 233 261   < > = values in this interval not displayed.   Blood Culture    Component Value Date/Time   SDES TRACHEAL ASPIRATE 10/08/2023 0627   SPECREQUEST NONE 10/08/2023 0627   CULT  10/08/2023 0627    Normal respiratory flora-no Staph aureus or Pseudomonas seen Performed at [facility], [address], [place], [place] [address]    REPTSTATUS 10/10/2023 FINAL 10/08/2023 9372    Cardiac Enzymes: No results for input(s): CKTOTAL, CKMB, CKMBINDEX, TROPONINI in the last 168 hours. CBG: Recent Labs  Lab 10/11/23 1555 10/11/23 1940 10/11/23 2340 10/12/23 0326 10/12/23 0748  GLUCAP 120* 119* 120* 100* 126*   Iron  Studies: No results for input(s): IRON , TIBC, TRANSFERRIN, FERRITIN in the last 72 hours. Lab Results  Component Value Date   INR 1.0 10/07/2023   Studies/Results: DG Chest Port 1 View Result Date: 10/12/2023 CLINICAL DATA:  Acute respiratory failure with hypoxemia. EXAM: PORTABLE CHEST 1 VIEW COMPARISON:  10/09/2023 FINDINGS: Patient is slightly rotated to the right. Interval removal endotracheal  tube. Two enteric tubes course into the region of the stomach and off the film as tips are not definitely visualized. Lungs are hypoinflated with continued mild hazy opacification over the left base with slight interval improved aeration. Mild prominence of the central pulmonary vessels suggesting a degree of vascular congestion without significant change. Stable cardiomegaly. Remainder of the exam is unchanged. IMPRESSION: 1. Interval removal of endotracheal tube. 2. Hypoinflation with continued mild hazy opacification over the left base with slight interval improved aeration. 3. Stable cardiomegaly with mild vascular congestion. Electronically Signed   By: Toribio Agreste M.D.   On: 10/12/2023 08:16   DG Abd Portable 1V Result Date: 10/12/2023 CLINICAL DATA:  Check gastric catheter placement EXAM: PORTABLE ABDOMEN - 1  VIEW COMPARISON:  10/11/2023 FINDINGS: Previously seen feeding catheter is again noted. Gastric catheter is now seen within the distal stomach as well. Scattered large and small bowel gas remains consistent with ileus. IMPRESSION: Gastric catheter within the distal stomach. Electronically Signed   By: Oneil Devonshire M.D.   On: 10/12/2023 02:41   DG Abd 1 View Result Date: 10/11/2023 CLINICAL DATA:  Check feeding catheter placement, follow-up ileus EXAM: ABDOMEN - 1 VIEW COMPARISON:  None Available. FINDINGS: Feeding catheter is noted in the distal stomach directed towards the duodenum. Diffuse gaseous distension of the large and small bowel is noted likely representing a generalized ileus. No free air is seen. No bony abnormality is noted. IMPRESSION: Changes consistent with generalized ileus. Feeding catheter is noted in the distal stomach. Electronically Signed   By: Oneil Devonshire M.D.   On: 10/11/2023 23:35    Medications:  ampicillin -sulbactam (UNASYN ) IV 200 mL/hr at 10/12/23 1000    amLODipine   10 mg Per Tube Daily   aspirin   300 mg Rectal Daily   Or   aspirin   81 mg Per Tube Daily   carvedilol   6.25 mg Per Tube BID WC   Chlorhexidine  Gluconate Cloth  6 each Topical Q0600   heparin   1,500 Units Intravenous Once   heparin  injection (subcutaneous)  5,000 Units Subcutaneous Q8H   insulin  aspart  0-6 Units Subcutaneous Q4H   isosorbide  dinitrate  5 mg Per Tube BID   labetalol   20 mg Intravenous Once   metoCLOPramide  (REGLAN ) injection  5 mg Intravenous Q6H   mouth rinse  15 mL Mouth Rinse 4 times per day   pantoprazole  (PROTONIX ) IV  40 mg Intravenous QHS    Dialysis Orders: SW TTS 4h   B500   138kg   2K bath AVF   Heparin  4000+ Wt gains 3.5- 5 kg on average Usually comes off 2-4kg over Last OP HD 7/24, post wt 142kg   Home bp meds: Norvasc  10 every day Coreg  25 bid Torsemide  60mg  bid  Assessment/Plan: Acute CVA: w/ L sided weakness. CT with acute R MCA; MRI 7/26  shows large R MCA infarct with mass effect; S/P TNK 7/25. Also went to NIR. Now extubated and sedation is off. On ASA; Neurology following ESRD: on HD TTS. Next HD 7/31 at bedside.  HTN: per CCM.  Volume: close to dry wt, UF 2-3 L w/ next HD Anemia of esrd: Hb 11- 12, no esa needs.  Dispo: Remains in ICU  Charmaine Piety, NP [facility] 10/12/2023,11:33 AM  LOS: 5 days

## 2023-10-12 NOTE — Progress Notes (Signed)
 Upon morning report, patient reportedly had episodes of vomiting/diarrhea overnight with concerns of ileus. Abdominal xray obtained overnight and confirmed ileus. RN also observed tachypnea on room air, and placed patient on 5L [place] with little to no resolution of work of breathing (RR maintaining >40). RN then placed patient on NRB at 15L. RN reached out to both Dr. [name] (with CCM), and JD Emilio, PA-C (with CCM) to address declining respiratory status, and PRN antihypertensive.  Verbal order to begin patient on HHFNC, and STAT chest Xray ordered.  Estevan Kersh, RN

## 2023-10-13 DIAGNOSIS — J9602 Acute respiratory failure with hypercapnia: Secondary | ICD-10-CM | POA: Diagnosis not present

## 2023-10-13 DIAGNOSIS — R1310 Dysphagia, unspecified: Secondary | ICD-10-CM | POA: Diagnosis not present

## 2023-10-13 DIAGNOSIS — N186 End stage renal disease: Secondary | ICD-10-CM | POA: Diagnosis not present

## 2023-10-13 DIAGNOSIS — J9601 Acute respiratory failure with hypoxia: Secondary | ICD-10-CM | POA: Diagnosis not present

## 2023-10-13 DIAGNOSIS — E785 Hyperlipidemia, unspecified: Secondary | ICD-10-CM | POA: Diagnosis not present

## 2023-10-13 DIAGNOSIS — I63512 Cerebral infarction due to unspecified occlusion or stenosis of left middle cerebral artery: Secondary | ICD-10-CM | POA: Diagnosis not present

## 2023-10-13 DIAGNOSIS — I779 Disorder of arteries and arterioles, unspecified: Secondary | ICD-10-CM | POA: Diagnosis not present

## 2023-10-13 DIAGNOSIS — I63411 Cerebral infarction due to embolism of right middle cerebral artery: Secondary | ICD-10-CM | POA: Diagnosis not present

## 2023-10-13 LAB — CBC
HCT: 33.8 % — ABNORMAL LOW (ref 39.0–52.0)
Hemoglobin: 10.9 g/dL — ABNORMAL LOW (ref 13.0–17.0)
MCH: 32.1 pg (ref 26.0–34.0)
MCHC: 32.2 g/dL (ref 30.0–36.0)
MCV: 99.4 fL (ref 80.0–100.0)
Platelets: 301 K/uL (ref 150–400)
RBC: 3.4 MIL/uL — ABNORMAL LOW (ref 4.22–5.81)
RDW: 13.3 % (ref 11.5–15.5)
WBC: 15.8 K/uL — ABNORMAL HIGH (ref 4.0–10.5)
nRBC: 0 % (ref 0.0–0.2)

## 2023-10-13 LAB — GLUCOSE, CAPILLARY
Glucose-Capillary: 112 mg/dL — ABNORMAL HIGH (ref 70–99)
Glucose-Capillary: 122 mg/dL — ABNORMAL HIGH (ref 70–99)
Glucose-Capillary: 125 mg/dL — ABNORMAL HIGH (ref 70–99)
Glucose-Capillary: 133 mg/dL — ABNORMAL HIGH (ref 70–99)
Glucose-Capillary: 136 mg/dL — ABNORMAL HIGH (ref 70–99)
Glucose-Capillary: 139 mg/dL — ABNORMAL HIGH (ref 70–99)

## 2023-10-13 LAB — BASIC METABOLIC PANEL WITH GFR
Anion gap: 26 — ABNORMAL HIGH (ref 5–15)
BUN: 87 mg/dL — ABNORMAL HIGH (ref 6–20)
CO2: 21 mmol/L — ABNORMAL LOW (ref 22–32)
Calcium: 10.5 mg/dL — ABNORMAL HIGH (ref 8.9–10.3)
Chloride: 95 mmol/L — ABNORMAL LOW (ref 98–111)
Creatinine, Ser: 15.18 mg/dL — ABNORMAL HIGH (ref 0.61–1.24)
GFR, Estimated: 4 mL/min — ABNORMAL LOW (ref >60)
Glucose, Bld: 116 mg/dL — ABNORMAL HIGH (ref 70–99)
Potassium: 4.3 mmol/L (ref 3.5–5.1)
Sodium: 142 mmol/L (ref 135–145)

## 2023-10-13 LAB — PHOSPHORUS: Phosphorus: 11.4 mg/dL — ABNORMAL HIGH (ref 2.5–4.6)

## 2023-10-13 MED ORDER — HYDRALAZINE HCL 20 MG/ML IJ SOLN
20.0000 mg | Freq: Once | INTRAMUSCULAR | Status: AC
  Administered 2023-10-13: 20 mg via INTRAVENOUS
  Filled 2023-10-13: qty 1

## 2023-10-13 MED ORDER — DOCUSATE SODIUM 50 MG/5ML PO LIQD
100.0000 mg | Freq: Two times a day (BID) | ORAL | Status: DC
  Administered 2023-10-13 – 2023-10-18 (×7): 100 mg
  Filled 2023-10-13 (×7): qty 10

## 2023-10-13 MED ORDER — HEPARIN SODIUM (PORCINE) 1000 UNIT/ML IJ SOLN
INTRAMUSCULAR | Status: AC
  Filled 2023-10-13: qty 4

## 2023-10-13 MED ORDER — LACTULOSE 10 GM/15ML PO SOLN
20.0000 g | Freq: Once | ORAL | Status: AC
  Administered 2023-10-13: 20 g
  Filled 2023-10-13: qty 30

## 2023-10-13 NOTE — Progress Notes (Signed)
 Inpatient Rehab Admissions Coordinator:   At this time pt appears to be a candidate for CIR. I will place a consult per our protocol.   Reche Lowers, PT, DPT Admissions Coordinator [phone] 10/13/23  4:27 PM

## 2023-10-13 NOTE — Progress Notes (Signed)
 Occupational Therapy Treatment Patient Details Name: Charls Custer MRN: [id number] DOB: [birth year] Today's Date: 10/13/2023   History of present illness The pt is a 48 yo male presenting 7/25 with L-sided weakness and slurred speech. Intubated for airway protection, pt given TNK, and is s/p revascularization of R MCA with IR 7/25. 7/29 extubated  PMH includes: CHF, cardiomyopathy, ESRD on HD, HTN, and obesity (BMI 41).   OT comments  Patient with fair progress towards patient focused goals.  Patient found leaning heavily to L with LUE hanging.  Patient needing Mod A to sit up and Mod A for sit to stand.  Once up, closer to Min A for step pivot, but poor safety and uncontrolled descent when sitting.  Patient nodding yes to being fatigued, so Mod A to lie down and position properly in bed.  ADL is slowly progressing from bedlevel.  OT will continue efforts in the acute setting to address deficits, and Patient will benefit from intensive inpatient follow-up therapy, >3 hours/day.      If plan is discharge home, recommend the following:  Two people to help with walking and/or transfers;Two people to help with bathing/dressing/bathroom;Assistance with cooking/housework;Assistance with feeding;Direct supervision/assist for medications management;Direct supervision/assist for financial management;Assist for transportation;Help with stairs or ramp for entrance;Supervision due to cognitive status   Equipment Recommendations       Recommendations for Other Services      Precautions / Restrictions Precautions Precautions: Fall Recall of Precautions/Restrictions: Impaired Precaution/Restrictions Comments: cortrak, watch SpO2 and RR Restrictions Weight Bearing Restrictions Per Provider Order: No       Mobility Bed Mobility   Bed Mobility: Sit to Sidelying         Sit to sidelying: Mod assist, +2 for safety/equipment General bed mobility comments: uncontrolled descent to L, poor trunk  control    Transfers Overall transfer level: Needs assistance Equipment used: None Transfers: Sit to/from Stand, Bed to chair/wheelchair/BSC Sit to Stand: Mod assist     Step pivot transfers: Mod assist           Balance Overall balance assessment: Needs assistance Sitting-balance support: Single extremity supported, Feet unsupported Sitting balance-Leahy Scale: Poor   Postural control: Posterior lean, Left lateral lean Standing balance support: Bilateral upper extremity supported Standing balance-Leahy Scale: Poor                             ADL either performed or assessed with clinical judgement   ADL   Eating/Feeding: NPO   Grooming: Minimal assistance;Sitting   Upper Body Bathing: Maximal assistance;Sitting   Lower Body Bathing: Maximal assistance;Bed level   Upper Body Dressing : Maximal assistance;Sitting   Lower Body Dressing: Total assistance;Bed level                      Extremity/Trunk Assessment Upper Extremity Assessment Upper Extremity Assessment: Right hand dominant LUE Deficits / Details: no AROM, PROM WFL, impaired sensation, no reaction/withdrawal to pain/pinch or nailbed pressure, clonus noted with quick stretch.  Watch for possible splint. LUE Sensation: decreased light touch;decreased proprioception LUE Coordination: decreased fine motor;decreased gross motor   Lower Extremity Assessment Lower Extremity Assessment: Defer to PT evaluation   Cervical / Trunk Assessment Cervical / Trunk Assessment: Other exceptions Cervical / Trunk Exceptions: large body habitus    Vision   Vision Assessment?: Vision impaired- to be further tested in functional context Additional Comments: patient was able to make eye contact, did  scan the room, ? mild inattention to L   Perception Perception Perception: Impaired Preception Impairment Details: Inattention/Neglect   Praxis     Communication Communication Communication:  Impaired Factors Affecting Communication: Difficulty expressing self   Cognition Arousal: Alert Behavior During Therapy: Flat affect Cognition: Difficult to assess Difficult to assess due to: Impaired communication                             Following commands: Impaired Following commands impaired: Only follows one step commands consistently      Cueing   Cueing Techniques: Verbal cues, Tactile cues  Exercises      Shoulder Instructions       General Comments VSS on RA    Pertinent Vitals/ Pain       Pain Assessment Pain Assessment: No/denies pain Pain Intervention(s): Monitored during session                                                          Frequency  Min 2X/week        Progress Toward Goals  OT Goals(current goals can now be found in the care plan section)  Progress towards OT goals: Progressing toward goals  Acute Rehab OT Goals OT Goal Formulation: With patient Time For Goal Achievement: 10/23/23 Potential to Achieve Goals: Good  Plan      Co-evaluation                 AM-PAC OT 6 Clicks Daily Activity     Outcome Measure   Help from another person eating meals?: Total Help from another person taking care of personal grooming?: A Lot Help from another person toileting, which includes using toliet, bedpan, or urinal?: A Lot Help from another person bathing (including washing, rinsing, drying)?: A Lot Help from another person to put on and taking off regular upper body clothing?: A Lot Help from another person to put on and taking off regular lower body clothing?: A Lot 6 Click Score: 11    End of Session Equipment Utilized During Treatment: Gait belt  OT Visit Diagnosis: Unsteadiness on feet (R26.81);Other abnormalities of gait and mobility (R26.89);Muscle weakness (generalized) (M62.81)   Activity Tolerance Patient limited by fatigue   Patient Left in bed;with call bell/phone within  reach;with nursing/sitter in room   Nurse Communication Mobility status        Time: 1500-1520 OT Time Calculation (min): 20 min  Charges: OT General Charges $OT Visit: 1 Visit OT Treatments $Self Care/Home Management : 8-22 mins  10/13/2023  RP, OTR/L  Acute Rehabilitation Services  Office:  [phone]   Charlie JONETTA Halsted 10/13/2023, 3:43 PM

## 2023-10-13 NOTE — Progress Notes (Signed)
 OT Cancellation Note  Patient Details Name: Nathan Campbell MRN: [id number] DOB: 28-May-1975   Cancelled Treatment:    Reason Eval/Treat Not Completed: Other (comment).  With PT, can try later if able, if not, will see the following date.    Nathan Campbell [name] 10/13/2023, 1:19 PM 10/13/2023  RP, OTR/L  Acute Rehabilitation Services  Office:  [phone]

## 2023-10-13 NOTE — Progress Notes (Signed)
   10/13/23 1215  Vitals  Temp 97.7 F (36.5 C)  Temp Source Axillary  BP 110/71  MAP (mmHg) 82  BP Location Left Leg  BP Method Automatic  Patient Position (if appropriate) Lying  Pulse Rate 99  Pulse Rate Source Monitor  ECG Heart Rate (!) 105  Resp (!) 35  Weight 127.2 kg  Type of Weight Post-Dialysis  Oxygen Therapy  SpO2 90 %  O2 Device HFNC  Post Treatment  Dialyzer Clearance Lightly streaked  Hemodialysis Intake (mL) 0 mL  Liters Processed 78  Fluid Removed (mL) 2700 mL  Tolerated HD Treatment Yes  Post-Hemodialysis Comments HD tx completed unable to reach goal due to drop in B/P, now resting with eyes closed  AVG/AVF Arterial Site Held (minutes) 7 minutes  AVG/AVF Venous Site Held (minutes) 7 minutes  Note  Patient Observations resting with eyes closed  Fistula / Graft Left Upper arm Arteriovenous fistula  Placement Date/Time: 11/11/20 0835   Placed prior to admission: No  Orientation: Left  Access Location: Upper arm  Access Type: (c) Arteriovenous fistula  Site Condition No complications  Fistula / Graft Assessment Present;Thrill;Bruit  Status Deaccessed  Drainage Description None   Bedside Alert and oriented.  Informed consent signed and in chart.   TX duration:3.15  Patient tolerated well.   Alert, without acute distress.  Hand-off given to patient's nurse.   Access used: LUA fistula Access issues: none  Total UF removed: 2700 Medication(s) given: none Post HD VS: see above Post HD weight: 127.2kg   Docia CHRISTELLA [facility]

## 2023-10-13 NOTE — Progress Notes (Signed)
 [facility] Progress Note   Subjective:    Seen and examined patient at bedside. HD just completed and 2.7L removed. BP now 110/71 after HD today. Appears slowly weaning down HFNC. Bedside RN informed me patient should be down-graded off ICU soon.  Objective Vitals:   10/13/23 1131 10/13/23 1144 10/13/23 1145 10/13/23 1215  BP: 104/63  95/60 110/71  Pulse: (!) 116  (!) 107 99  Resp: 16  (!) 33 (!) 35  Temp:  97.7 F (36.5 C)  97.7 F (36.5 C)  TempSrc:  Axillary  Axillary  SpO2: 95%  97% 90%  Weight:    127.2 kg  Height:       Physical Exam General: Sleeping, moans to voice, on HFNC, NAD HEENT: NGT in place Heart: S1 and S2; No murmurs, gallops, or rubs Lungs: Clear anteriorly Abdomen: Soft, round Extremities: No LE edema Dialysis Access: AVF (+) B/T  Filed Weights   10/13/23 0500 10/13/23 0825 10/13/23 1215  Weight: 132.8 kg 132.8 kg 127.2 kg    Intake/Output Summary (Last 24 hours) at 10/13/2023 1241 Last data filed at 10/13/2023 1215 Gross per 24 hour  Intake 140 ml  Output 7200 ml  Net -7060 ml    Additional Objective Labs: Basic Metabolic Panel: Recent Labs  Lab 10/09/23 0610 10/10/23 0625 10/11/23 0547 10/12/23 0605 10/12/23 1426 10/13/23 0552  NA 136   < > 136 136 137 142  K 4.7   < > 4.8 4.1 3.9 4.3  CL 94*   < > 95* 94*  --  95*  CO2 24   < > 23 23  --  21*  GLUCOSE 97   < > 151* 122*  --  116*  BUN 53*   < > 68* 60*  --  87*  CREATININE 11.53*   < > 12.66* 12.03*  --  15.18*  CALCIUM  9.6   < > 9.6 10.0  --  10.5*  PHOS 8.8*  --   --   --   --  11.4*   < > = values in this interval not displayed.   Liver Function Tests: Recent Labs  Lab 10/07/23 0851 10/08/23 0505  AST 15 10*  ALT 13 11  ALKPHOS 82 66  BILITOT 0.6 0.8  PROT 7.2 6.1*  ALBUMIN 3.7 3.1*   No results for input(s): LIPASE, AMYLASE in the last 168 hours. CBC: Recent Labs  Lab 10/07/23 0851 10/07/23 1032 10/08/23 0505 10/10/23 0625 10/11/23 0547  10/12/23 0605 10/12/23 1426 10/13/23 0552  WBC 7.9  --  8.6 11.6* 11.2* 13.6*  --  15.8*  NEUTROABS 5.1  --  6.2 8.5*  --   --   --   --   HGB 11.2*   < > 9.8* 10.6* 9.9* 10.5* 10.2* 10.9*  HCT 34.7*   < > 29.8* 31.8* 30.7* 32.1* 30.0* 33.8*  MCV 99.4  --  98.7 96.7 98.7 99.4  --  99.4  PLT 229  --  203 236 233 261  --  301   < > = values in this interval not displayed.   Blood Culture    Component Value Date/Time   SDES TRACHEAL ASPIRATE 10/08/2023 0627   SPECREQUEST NONE 10/08/2023 0627   CULT  10/08/2023 0627    Normal respiratory flora-no Staph aureus or Pseudomonas seen Performed at [facility], [address], [place], [place] [address]    REPTSTATUS 10/10/2023 FINAL 10/08/2023 9372    Cardiac Enzymes: No results  for input(s): CKTOTAL, CKMB, CKMBINDEX, TROPONINI in the last 168 hours. CBG: Recent Labs  Lab 10/12/23 1921 10/12/23 2330 10/13/23 0326 10/13/23 0747 10/13/23 1139  GLUCAP 127* 107* 136* 139* 133*   Iron  Studies: No results for input(s): IRON , TIBC, TRANSFERRIN, FERRITIN in the last 72 hours. Lab Results  Component Value Date   INR 1.0 10/07/2023   Studies/Results: DG Chest Port 1 View Result Date: 10/12/2023 CLINICAL DATA:  Acute respiratory failure with hypoxemia. EXAM: PORTABLE CHEST 1 VIEW COMPARISON:  10/09/2023 FINDINGS: Patient is slightly rotated to the right. Interval removal endotracheal tube. Two enteric tubes course into the region of the stomach and off the film as tips are not definitely visualized. Lungs are hypoinflated with continued mild hazy opacification over the left base with slight interval improved aeration. Mild prominence of the central pulmonary vessels suggesting a degree of vascular congestion without significant change. Stable cardiomegaly. Remainder of the exam is unchanged. IMPRESSION: 1. Interval removal of endotracheal tube. 2. Hypoinflation with continued mild hazy opacification over the left base  with slight interval improved aeration. 3. Stable cardiomegaly with mild vascular congestion. Electronically Signed   By: Toribio Agreste M.D.   On: 10/12/2023 08:16   DG Abd Portable 1V Result Date: 10/12/2023 CLINICAL DATA:  Check gastric catheter placement EXAM: PORTABLE ABDOMEN - 1 VIEW COMPARISON:  10/11/2023 FINDINGS: Previously seen feeding catheter is again noted. Gastric catheter is now seen within the distal stomach as well. Scattered large and small bowel gas remains consistent with ileus. IMPRESSION: Gastric catheter within the distal stomach. Electronically Signed   By: Oneil Devonshire M.D.   On: 10/12/2023 02:41   DG Abd 1 View Result Date: 10/11/2023 CLINICAL DATA:  Check feeding catheter placement, follow-up ileus EXAM: ABDOMEN - 1 VIEW COMPARISON:  None Available. FINDINGS: Feeding catheter is noted in the distal stomach directed towards the duodenum. Diffuse gaseous distension of the large and small bowel is noted likely representing a generalized ileus. No free air is seen. No bony abnormality is noted. IMPRESSION: Changes consistent with generalized ileus. Feeding catheter is noted in the distal stomach. Electronically Signed   By: Oneil Devonshire M.D.   On: 10/11/2023 23:35    Medications:  ampicillin -sulbactam (UNASYN ) IV Stopped (10/12/23 2203)    amLODipine   10 mg Per Tube Daily   aspirin   300 mg Rectal Daily   Or   aspirin   81 mg Per Tube Daily   carvedilol   6.25 mg Per Tube BID WC   Chlorhexidine  Gluconate Cloth  6 each Topical Q0600   docusate  100 mg Per Tube BID   heparin   1,500 Units Intravenous Once   heparin  injection (subcutaneous)  5,000 Units Subcutaneous Q8H   insulin  aspart  0-6 Units Subcutaneous Q4H   isosorbide  dinitrate  5 mg Per Tube BID   lactulose   20 g Per Tube Once   metoCLOPramide  (REGLAN ) injection  5 mg Intravenous Q6H   mouth rinse  15 mL Mouth Rinse 4 times per day   pantoprazole  (PROTONIX ) IV  40 mg Intravenous QHS    Dialysis Orders: SW  TTS 4h   B500   138kg   2K bath AVF   Heparin  4000+ Wt gains 3.5- 5 kg on average Usually comes off 2-4kg over Last OP HD 7/24, post wt 142kg   Home bp meds: Norvasc  10 every day Coreg  25 bid Torsemide  60mg  bid  Assessment/Plan: Acute CVA: w/ L sided weakness. CT with acute R MCA; MRI 7/26 shows  large R MCA infarct with mass effect; S/P TNK 7/25. Also went to NIR. Now extubated and sedation is off. On ASA; Neurology following Hypoxic resp failure: 2nd CVA, appears he aspirated, per primary Ileus: NGT to suction, on reglen ESRD: on HD TTS. Next HD 8/2. HTN: per CCM.  Volume: appears to be under EDW, lower EDW at dc Anemia of esrd: Hb 11- 12, no esa needs.  Dispo: Remains in ICU  Nathan Piety, NP [facility] 10/13/2023,12:41 PM  LOS: 6 days

## 2023-10-13 NOTE — TOC Progression Note (Signed)
 Transition of Care [facility]) - Progression Note    Patient Details  Name: Nathan Campbell MRN: [id number] Date of Birth: [birth year]  Transition of Care [facility]) CM/SW Contact  [name], LCSW Phone Number: 10/13/2023, 8:30 AM  Clinical Narrative:    Patient intubated. Continuing to follow for needs.    Expected Discharge Plan: IP Rehab Facility Barriers to Discharge: Continued Medical Work up, English as a second language teacher               Expected Discharge Plan and Services     Post Acute Care Choice: IP Rehab Living arrangements for the past 2 months: Single Family Home                                       Social Drivers of Health (SDOH) Interventions SDOH Screenings   Food Insecurity: No Food Insecurity (03/26/2022)  Housing: Low Risk  (03/26/2022)  Transportation Needs: No Transportation Needs (03/26/2022)  Utilities: Not At Risk (03/26/2022)  Tobacco Use: High Risk (10/07/2023)    Readmission Risk Interventions     No data to display

## 2023-10-13 NOTE — Progress Notes (Signed)
 MD at bedside for morning rounds. Dialysis RN setting up for morning dialysis. Verbal order to hold carvedilol  and amlodipine  until after dialysis. Administer carvedilol  after dialysis and reevaluate BP. Orders given to administer subsequent antihypertensives staggered approximately 3 hours apart. No further orders at this time.

## 2023-10-13 NOTE — Progress Notes (Signed)
 Physical Therapy Treatment Patient Details Name: Nathan Campbell MRN: [id number] DOB: [birth year] Today's Date: 10/13/2023   History of Present Illness The pt is a 48 yo male presenting 7/25 with L-sided weakness and slurred speech. Intubated for airway protection, pt given TNK, and is s/p revascularization of R MCA with IR 7/25. 7/29 extubated  PMH includes: CHF, cardiomyopathy, ESRD on HD, HTN, and obesity (BMI 41).    PT Comments  Patient asking to get OOB per RN. Alert and able to maintain sitting balance at EOB with CGA-min assist. Stood with +2 min assist, left knee not buckling and progressed to step-pivot to chair on his right. Small shuffling steps, and did advance LLE himself. Beginning to vocalize, although remains garbled and difficulty to understand. [place] lift pad under patient for return to bed if he is too tired to stand.     If plan is discharge home, recommend the following: Two people to help with walking and/or transfers;Two people to help with bathing/dressing/bathroom;Assistance with cooking/housework;Assistance with feeding;Assist for transportation;Help with stairs or ramp for entrance   Can travel by private vehicle        Equipment Recommendations  Other (comment) (defer until gait training initiated)    Recommendations for Other Services       Precautions / Restrictions Precautions Precautions: Fall Recall of Precautions/Restrictions: Impaired Precaution/Restrictions Comments: cortrak, foley, watch SpO2 and RR Restrictions Weight Bearing Restrictions Per Provider Order: No     Mobility  Bed Mobility Overal bed mobility: Needs Assistance Bed Mobility: Rolling, Sidelying to Sit Rolling: Min assist, Used rails Sidelying to sit: Max assist, +2 for physical assistance       General bed mobility comments: rolling bil for pericare prior to come to sitting; due to room setup, up from left side requiring incr assist    Transfers Overall transfer level:  Needs assistance Equipment used: None Transfers: Sit to/from Stand, Bed to chair/wheelchair/BSC Sit to Stand: Min assist, +2 physical assistance, +2 safety/equipment   Step pivot transfers: Min assist, +2 physical assistance, +2 safety/equipment       General transfer comment: initially blocking Lt knee, however without buckling; independently advanced LLE in short shufffling steps    Ambulation/Gait                   Stairs             Wheelchair Mobility     Tilt Bed    Modified Rankin (Stroke Patients Only) Modified Rankin (Stroke Patients Only) Pre-Morbid Rankin Score: No symptoms Modified Rankin: Moderately severe disability     Balance Overall balance assessment: Needs assistance Sitting-balance support: Single extremity supported, Feet unsupported Sitting balance-Leahy Scale: Poor Sitting balance - Comments: EOB with tendency to lose balance left and posterior   Standing balance support: No upper extremity supported Standing balance-Leahy Scale: Poor Standing balance comment: slight sway in standig                            Communication Communication Communication: Impaired Factors Affecting Communication: Difficulty expressing self  Cognition Arousal: Alert Behavior During Therapy: Flat affect   PT - Cognitive impairments: Difficult to assess Difficult to assess due to: Impaired communication                     PT - Cognition Comments: pt consistent with yes/no nodding. Voice gurgling and difficult to understand at times. States name, month Following commands: Intact Following commands  impaired: Only follows one step commands consistently    Cueing Cueing Techniques: Verbal cues, Tactile cues  Exercises      General Comments General comments (skin integrity, edema, etc.): RN in to assist with transfer.      Pertinent Vitals/Pain Pain Assessment Pain Assessment: Faces Faces Pain Scale: No hurt    Home  Living                          Prior Function            PT Goals (current goals can now be found in the care plan section) Acute Rehab PT Goals Patient Stated Goal: unable to state Time For Goal Achievement: 10/23/23 Potential to Achieve Goals: Good Progress towards PT goals: Progressing toward goals    Frequency    Min 3X/week      PT Plan      Co-evaluation              AM-PAC PT 6 Clicks Mobility   Outcome Measure  Help needed turning from your back to your side while in a flat bed without using bedrails?: A Little Help needed moving from lying on your back to sitting on the side of a flat bed without using bedrails?: Total Help needed moving to and from a bed to a chair (including a wheelchair)?: Total Help needed standing up from a chair using your arms (e.g., wheelchair or bedside chair)?: Total Help needed to walk in hospital room?: Total Help needed climbing 3-5 steps with a railing? : Total 6 Click Score: 8    End of Session Equipment Utilized During Treatment: Oxygen Activity Tolerance: Patient tolerated treatment well Patient left: with call bell/phone within reach;in chair;with chair alarm set;with nursing/sitter in room Nurse Communication: Mobility status PT Visit Diagnosis: Unsteadiness on feet (R26.81);Other abnormalities of gait and mobility (R26.89);Muscle weakness (generalized) (M62.81);Hemiplegia and hemiparesis Hemiplegia - Right/Left: Left Hemiplegia - dominant/non-dominant: Non-dominant Hemiplegia - caused by: Cerebral infarction     Time: 8744-8676 PT Time Calculation (min) (ACUTE ONLY): 28 min  Charges:    $Therapeutic Activity: 23-37 mins PT General Charges $$ ACUTE PT VISIT: 1 Visit                      Nathan Campbell, PT Acute Rehabilitation Services  Office [phone]    Nathan Campbell 10/13/2023, 1:31 PM

## 2023-10-13 NOTE — Progress Notes (Addendum)
 STROKE TEAM PROGRESS NOTE  Hospital events 7/25: Patient admitted with right MCA territory infarct, given TNK and taken to interventional radiology for mechanical thrombectomy 7/29: Patient extubated 7/29: Ileus, tachypnea, now on heated high flow  INTERIM HISTORY/SUBJECTIVE Cortrak and NG in place due to ileus. PCCM following. HD today Patient is awake and interactive.  Continues to have dense left hemiparesis with is able to move left leg off the bed against gravity.  History unable to speak but tries to speak follow commands well.  Continues to need close monitoring for respiratory status and abdominal distention and ileus.  Plan for dialysis today. OBJECTIVE  CBC    Component Value Date/Time   WBC 15.8 (H) 10/13/2023 0552   RBC 3.40 (L) 10/13/2023 0552   HGB 10.9 (L) 10/13/2023 0552   HGB 9.2 (L) 01/22/2021 1235   HCT 33.8 (L) 10/13/2023 0552   HCT 28.9 (L) 01/22/2021 1235   PLT 301 10/13/2023 0552   PLT 208 01/22/2021 1235   MCV 99.4 10/13/2023 0552   MCV 93 01/22/2021 1235   MCH 32.1 10/13/2023 0552   MCHC 32.2 10/13/2023 0552   RDW 13.3 10/13/2023 0552   RDW 12.0 01/22/2021 1235   LYMPHSABS 1.1 10/10/2023 0625   LYMPHSABS 0.8 01/22/2021 1235   MONOABS 1.6 (H) 10/10/2023 0625   EOSABS 0.3 10/10/2023 0625   EOSABS 0.1 01/22/2021 1235   BASOSABS 0.0 10/10/2023 0625   BASOSABS 0.0 01/22/2021 1235    BMET    Component Value Date/Time   NA 142 10/13/2023 0552   K 4.3 10/13/2023 0552   CL 95 (L) 10/13/2023 0552   CO2 21 (L) 10/13/2023 0552   GLUCOSE 116 (H) 10/13/2023 0552   BUN 87 (H) 10/13/2023 0552   CREATININE 15.18 (H) 10/13/2023 0552   CALCIUM  10.5 (H) 10/13/2023 0552   GFRNONAA 4 (L) 10/13/2023 0552    IMAGING past 24 hours No results found.    Vitals:   10/13/23 0610 10/13/23 0615 10/13/23 0700 10/13/23 0753  BP: (!) 210/71 (!) 183/83 (!) 179/77   Pulse: (!) 112 (!) 105 (!) 120   Resp: (!) 33 (!) 30 16   Temp:    97.7 F (36.5 C)  TempSrc:     Axillary  SpO2: 94% 93% 96% 99%  Weight:      Height:         PHYSICAL EXAM General: Obese middle-aged African-American male.  Mild respiratory distress on high flow nasal cannula oxygen  CV: Regular rate and rhythm on monitor Respiratory: Mechanically ventilated, respirations synchronous with ventilator GI: Abdomen soft and nontender   NEURO:  Mental Status: Able to follow one step commands, midline and with right upper extremity and bilateral lower extremities Speech/Language:Following commands. No verbal output  Cranial Nerves:  II:  PERRL III, IV, VI: Right gaze preference but able to cross midline. Eyelids elevate symmetrically.  VII:  VIII: hearing intact to voice. IX, X: Cough and gag intact KP:Yzji midline  XII: tongue is midline without fasciculations. Motor:  Moves right upper and lower extremity against gravity purposefully, localizes sternal rub.  Able to move left lower extremity against gravity.  Left upper extremity appears plegic and flaccid   Right upper extremity is purposeful.  Right lower extremity purposeful, left lower extremity purposeful with drift  Tone: is normal and bulk is normal Sensation- Withdraws in right upper extremity and bilateral lower extremities Coordination: no gross ataxia on the right Gait- deferred   ASSESSMENT/PLAN  [name] is a  48 y.o. male with history of obesity, hypertension, end-stage renal disease on HD TTS, combined systolic and diastolic heart failure who presented to the emergency department on 7/25 with left-sided weakness, slurred speech.  NIH on Admission 13. IV TNK after CT head showed what looked like a calcific embolus in the right MCA and a good ASPECT  score and CT angiogram could not be obtained with emergent cerebral catheter angiogram showed right M1 occlusion and he underwent  TICI 2 b revascularization of the superior and inferior division to the M2 region but was left intubated for medical issues given  history of CHF and renal failure   Acute Ischemic Infarct:  right MCA territory infarct  s/p mechanical thrombectomy and TNK Etiology: Embolic versus large vessel disease Code Stroke CT head - Findings consistent with acute right MCA territory ischemic changes, ASPECT score 7. And calcific density is noted in the proximal right M1 segment. This is most concerning for a calcific embolus. Intracranial atherosclerotic changes considered less likely. MRI   Large right MCA territory infarct with associated diffuse cortical thickening and some mass effect MRA  Distal right M2 occlusion, somewhat more proximal than on the final angio images. No new occlusion is present. Repeat CT Head-  Expected evolution of the large right MCA territory infarct with effacement of the sulci and hyperdense thrombus within sylvian branches of the right MCA. No significant midline shift. 2D Echo LA mildly dilated, right atria fairly dilated.  EF 50-55% LDL 85 HgbA1c 5.1 VTE prophylaxis -heparin  No antithrombotic prior to admission, now on aspirin  81 mg daily and aspirin  300 mg suppository daily  Therapy recommendations:  Pending Disposition:  Neuro ICU  S/p thrombectomy of R M1 with TICI 2B revascularization  Intubated prior to procedure due to agitation   Acute hypoxic and hypercapnic respiratory failure extubated to high flow nasal cannula PCCM following   Combined systolic and diastolic heart failure Hypertension Pulmonary hypertension Home meds: Amlodipine , isosorbide  mononitrate, furosemide , carvedilol  Resume GDMT when BP stabilized Stable Blood Pressure Goal: BP less than 180/105   Hyperlipidemia LDL 85 goal < 70 Add atorvastatin Continue statin at discharge  Elevated triglycerides [phone]  Dysphagia NPO Tube feeding started  Ileus  NG placed and to suction Reglan  5mg  q6 TF on hold, meds still being given  ESRD on HD Nephrology consulted HD 7/26, next treatment 7/29   Hospital day  # 6  Patient seen and examined by NP/APP with MD. MD to update note as needed.   Jorene Last, DNP, FNP-BC [facility] Pager: [phone]  I have personally obtained history,examined this patient, reviewed notes, independently viewed imaging studies, participated in medical decision making and plan of care.ROS completed by me personally and pertinent positives fully documented  I have made any additions or clarifications directly to the above note. Agree with note above.  Patient continues to show slow neurological improvement but has persistent left arm weakness and mild left leg weakness.  Continue need by mouth for his eyelids which appears to be improving.  Continue management with dialysis and respiratory issues as per critical care team.  Continue aspirin  alone for stroke prevention for now we will need 30-day heart monitor at discharge to look for paroxysmal.  Discussed with Dr. [name] critical care medicine and Dr. Dalene nephrologist This patient is critically ill and at significant risk of neurological worsening, death and care requires constant monitoring of vital signs, hemodynamics,respiratory and cardiac monitoring, extensive review of multiple databases, frequent neurological assessment, discussion with  family, other specialists and medical decision making of high complexity.I have made any additions or clarifications directly to the above note.This critical care time does not reflect procedure time, or teaching time or supervisory time of PA/NP/Med Resident etc but could involve care discussion time.  I spent 30 minutes of neurocritical care time  in the care of  this patient.      Eather Popp, MD Medical Director [facility] Pager: [phone] 10/13/2023 12:46 PM

## 2023-10-13 NOTE — Progress Notes (Signed)
 eLink Physician-Brief Progress Note Patient Name: Nathan Campbell DOB: [birth year] MRN: [id number]   Date of Service  10/13/2023  HPI/Events of Note  hypertensive despite being given prn meds, nothing else to give, pending dialysis today.  Goal SBP less than 170  eICU Interventions  Additional hydralazine  x 1      Intervention Category Intermediate Interventions: Hypertension - evaluation and management  Kathaleya Mcduffee 10/13/2023, 2:34 AM

## 2023-10-13 NOTE — Progress Notes (Signed)
 NAME:  Nathan Campbell, MRN:  [id number], DOB:  1976/01/02, LOS: 6 ADMISSION DATE:  10/07/2023, CONSULTATION DATE:  7/25 REFERRING MD: Stroke, CHIEF COMPLAINT: Stroke  History of Present Illness:  48 year old male with past medical history of obesity, hypertension, end-stage renal disease on HD TTS, combined systolic and diastolic heart failure who presented to the emergency department on 7/25 with left-sided weakness, slurred speech.  LKW 0630.  Presented as a code stroke and was greeted by neurology at the bridge. CT head demonstrated acute right MCA infarct, ASPECTS 7.  Additionally he has calcified density along the proximal right M1 segment concerning for calcific embolus. CTA unable to be obtained for restlessness and concern over his airway so he was intubated. Received TNK @ [id number]. Taken to [facility] @ [phone] for angiogram and mechanical intervention.  Pertinent  Medical History  obesity, hypertension, end-stage renal disease on HD TTS, combined systolic and diastolic heart failure  Significant Hospital Events: Including procedures, antibiotic start and stop dates in addition to other pertinent events   7/25: Presented as a code stroke, admit , brought to ICU intubated, PEEP of 10 added 7/26 HD 7/27 tachypneic on pressure support 8/5 >> HD 7/29 extubated 7/30 Overnight had vomiting and aspirated -NG tube placed and put on suction ,X-ray KUB showed ileus , Placed on high heated high flow nasal cannula  Interim History / Subjective:   Defervesced last 24 hours Remains on salter nasal cannula, good cough. Some intermittent agitation overnight but did not require sedation Minimal bowel movement with lactulose  yesterday  Objective   Blood pressure (!) 179/77, pulse (!) 120, temperature 97.7 F (36.5 C), temperature source Axillary, resp. rate 16, height 6' (1.829 m), weight 132.8 kg, SpO2 99%.        Intake/Output Summary (Last 24 hours) at 10/13/2023 0810 Last data filed at 10/13/2023  0700 Gross per 24 hour  Intake 199.87 ml  Output 1800 ml  Net -1600.13 ml   Filed Weights   10/10/23 0703 10/12/23 0600 10/13/23 0500  Weight: (!) 141.4 kg (!) 136.6 kg 132.8 kg    Examination: General: middle aged morbidly obese man, on HHFNC HENT: pupils pinpoint, non reactive; ncat,  ogt Lungs: Decreased breath sounds bilateral, Cheyne-Stokes breathing with mild tachypnea Cardiovascular: S1-S2 regular, no murmur, no rub Abdomen: Obese, soft Extremities: no pitting edema Neuro: Somnolent when awake follows commands, good strength on right, left hemiplegia GU: foley  Labs show rising BUN and creatinine normal electrolytes, mild increased leukocytosis stable anemia  Chest x-ray 7/30 hazy left lower lobe infiltrate with improved aeration  X-ray abdomen shows ileus  Resolved Hospital Problem list    Assessment & Plan:  Acute right MCA infarct s/p TNK and NIR mechanical thrombectomy  Left sided weakness and slurred speech. CT w/ acute right MCA infarct s/p TNK and TICI 2b revascularization.  MRI 7/26 shows large right MCA infarct with some mass effect - stroke team primary - Frequent neuro checks -Aspirin  daily - Neuroprotective measures: HOB > 30 degrees, normoglycemia, normothermia   Acute hypoxic and hypercapneic respiratory failure 2/2 above  Intubated to facilitate CT, concern for airway protection in setting of acute stroke.  Persistent hypoxic may be due to pulm hypertension or due to fluid overload, aspiration possible with bibasilar infiltrates,  Extubated 7/29 then worsened due to aspiration - Back down to 6 L salter - Continue empiric Unasyn  for aspiration, respiratory culture negative - titrate FiO2 to sat goal >92    Combined systolic and diastolic heart failure  Echo shows enlarged RA/RV, nondiagnostic bubble study  , worsening of pre-existing secondary pulm hypertension Hypertension Home meds amlodipine , carvedilol , isosorbide  mononitrate ,torsemide  60  BID  - SBP Goal 160 and lower, fluctuating last 24 hours - Resume carvedilol  and amlodipine  but stagger to avoid hypotension - IV labetalol  and hydralazine  as needed   ESRD on HD TTS Anemia of chronic disease Last dialysis 7/29.  - Renal assisting, HD planned today - strict I&O - Avoid nephrotoxic agents, renally dose medications  Ileus - tube feeds on hold NG to LIS Reglan  5 mg every 6 x 6 doses Okay to use core track for meds -hopefully can restart trickle tube feeds soon   Best Practice (right click and Reselect all SmartList Selections daily)   Diet/type: NPO  DVT prophylaxis: SCD GI prophylaxis: PPI Lines: NA Foley:  Yes, and it is still needed Code Status:  full code Last date of multidisciplinary goals of care discussion [per primary]  Labs   CBC: Recent Labs  Lab 10/07/23 0851 10/07/23 1032 10/08/23 0505 10/10/23 0625 10/11/23 0547 10/12/23 0605 10/12/23 1426 10/13/23 0552  WBC 7.9  --  8.6 11.6* 11.2* 13.6*  --  15.8*  NEUTROABS 5.1  --  6.2 8.5*  --   --   --   --   HGB 11.2*   < > 9.8* 10.6* 9.9* 10.5* 10.2* 10.9*  HCT 34.7*   < > 29.8* 31.8* 30.7* 32.1* 30.0* 33.8*  MCV 99.4  --  98.7 96.7 98.7 99.4  --  99.4  PLT 229  --  203 236 233 261  --  301   < > = values in this interval not displayed.    Basic Metabolic Panel: Recent Labs  Lab 10/09/23 0610 10/10/23 0625 10/11/23 0547 10/12/23 0605 10/12/23 1426 10/13/23 0552  NA 136 132* 136 136 137 142  K 4.7 4.0 4.8 4.1 3.9 4.3  CL 94* 92* 95* 94*  --  95*  CO2 24 24 23 23   --  21*  GLUCOSE 97 108* 151* 122*  --  116*  BUN 53* 37* 68* 60*  --  87*  CREATININE 11.53* 7.69* 12.66* 12.03*  --  15.18*  CALCIUM  9.6 9.6 9.6 10.0  --  10.5*  MG 2.1  --   --   --   --   --   PHOS 8.8*  --   --   --   --  11.4*   GFR: Estimated Creatinine Clearance: 8.7 mL/min (A) (by C-G formula based on SCr of 15.18 mg/dL (H)). Recent Labs  Lab 10/10/23 0625 10/11/23 0547 10/12/23 0605 10/13/23 0552   WBC 11.6* 11.2* 13.6* 15.8*    Liver Function Tests: Recent Labs  Lab 10/07/23 0851 10/08/23 0505  AST 15 10*  ALT 13 11  ALKPHOS 82 66  BILITOT 0.6 0.8  PROT 7.2 6.1*  ALBUMIN 3.7 3.1*   No results for input(s): LIPASE, AMYLASE in the last 168 hours. No results for input(s): AMMONIA in the last 168 hours.  ABG    Component Value Date/Time   PHART 7.425 10/12/2023 1426   PCO2ART 40.2 10/12/2023 1426   PO2ART 71 (L) 10/12/2023 1426   HCO3 26.4 10/12/2023 1426   TCO2 28 10/12/2023 1426   O2SAT 94 10/12/2023 1426     Coagulation Profile: Recent Labs  Lab 10/07/23 0851  INR 1.0    Cardiac Enzymes: No results for input(s): CKTOTAL, CKMB, CKMBINDEX, TROPONINI in the last 168 hours.  HbA1C: Hgb  A1c MFr Bld  Date/Time Value Ref Range Status  10/08/2023 05:05 AM 5.1 4.8 - 5.6 % Final    Comment:    (NOTE) Diagnosis of Diabetes The following HbA1c ranges recommended by the American Diabetes Association (ADA) may be used as an aid in the diagnosis of diabetes mellitus.  Hemoglobin             Suggested A1C NGSP%              Diagnosis  <5.7                   Non Diabetic  5.7-6.4                Pre-Diabetic  >6.4                   Diabetic  <7.0                   Glycemic control for                       adults with diabetes.    06/11/2016 10:57 AM 4.7 (L) 4.8 - 5.6 % Final    Comment:    (NOTE)         Pre-diabetes: 5.7 - 6.4         Diabetes: >6.4         Glycemic control for adults with diabetes: <7.0     CBG: Recent Labs  Lab 10/12/23 1629 10/12/23 1921 10/12/23 2330 10/13/23 0326 10/13/23 0747  GLUCAP 106* 127* 107* 136* 139*    Critical care time: 31 m    Jaymin Waln V. [name], MD [facility] 10/13/23 8:10 AM  Please see [email or web address] for pager details.  From 7A-7P if no response, please call [phone] After hours, please call ELink [phone]

## 2023-10-13 NOTE — Progress Notes (Signed)
 SLP Cancellation Note  Patient Details Name: Nathan Campbell MRN: [id number] DOB: [birth year]   Cancelled treatment:       Reason Eval/Treat Not Completed: Medical issues which prohibited therapy. Patient with ileus, has Cortak but tube feedings on hold. SLP will continue to follow for readiness.   Norleen IVAR Blase, MA, CCC-SLP Speech Therapy

## 2023-10-14 ENCOUNTER — Inpatient Hospital Stay (HOSPITAL_COMMUNITY)

## 2023-10-14 DIAGNOSIS — I5042 Chronic combined systolic (congestive) and diastolic (congestive) heart failure: Secondary | ICD-10-CM | POA: Diagnosis not present

## 2023-10-14 DIAGNOSIS — K567 Ileus, unspecified: Secondary | ICD-10-CM

## 2023-10-14 DIAGNOSIS — J9601 Acute respiratory failure with hypoxia: Secondary | ICD-10-CM | POA: Diagnosis not present

## 2023-10-14 DIAGNOSIS — I639 Cerebral infarction, unspecified: Secondary | ICD-10-CM | POA: Diagnosis not present

## 2023-10-14 DIAGNOSIS — J9602 Acute respiratory failure with hypercapnia: Secondary | ICD-10-CM | POA: Diagnosis not present

## 2023-10-14 DIAGNOSIS — I779 Disorder of arteries and arterioles, unspecified: Secondary | ICD-10-CM | POA: Diagnosis not present

## 2023-10-14 DIAGNOSIS — N186 End stage renal disease: Secondary | ICD-10-CM | POA: Diagnosis not present

## 2023-10-14 DIAGNOSIS — R130 Aphagia: Secondary | ICD-10-CM | POA: Diagnosis not present

## 2023-10-14 DIAGNOSIS — R1310 Dysphagia, unspecified: Secondary | ICD-10-CM | POA: Diagnosis not present

## 2023-10-14 DIAGNOSIS — I63511 Cerebral infarction due to unspecified occlusion or stenosis of right middle cerebral artery: Secondary | ICD-10-CM | POA: Diagnosis not present

## 2023-10-14 DIAGNOSIS — E669 Obesity, unspecified: Secondary | ICD-10-CM

## 2023-10-14 DIAGNOSIS — I63512 Cerebral infarction due to unspecified occlusion or stenosis of left middle cerebral artery: Secondary | ICD-10-CM | POA: Diagnosis not present

## 2023-10-14 LAB — BASIC METABOLIC PANEL WITH GFR
Anion gap: 24 — ABNORMAL HIGH (ref 5–15)
BUN: 72 mg/dL — ABNORMAL HIGH (ref 6–20)
CO2: 25 mmol/L (ref 22–32)
Calcium: 10.8 mg/dL — ABNORMAL HIGH (ref 8.9–10.3)
Chloride: 91 mmol/L — ABNORMAL LOW (ref 98–111)
Creatinine, Ser: 13.26 mg/dL — ABNORMAL HIGH (ref 0.61–1.24)
GFR, Estimated: 4 mL/min — ABNORMAL LOW (ref >60)
Glucose, Bld: 114 mg/dL — ABNORMAL HIGH (ref 70–99)
Potassium: 4.3 mmol/L (ref 3.5–5.1)
Sodium: 140 mmol/L (ref 135–145)

## 2023-10-14 LAB — CBC
HCT: 38.2 % — ABNORMAL LOW (ref 39.0–52.0)
Hemoglobin: 11.9 g/dL — ABNORMAL LOW (ref 13.0–17.0)
MCH: 31.3 pg (ref 26.0–34.0)
MCHC: 31.2 g/dL (ref 30.0–36.0)
MCV: 100.5 fL — ABNORMAL HIGH (ref 80.0–100.0)
Platelets: 350 K/uL (ref 150–400)
RBC: 3.8 MIL/uL — ABNORMAL LOW (ref 4.22–5.81)
RDW: 13.2 % (ref 11.5–15.5)
WBC: 16.2 K/uL — ABNORMAL HIGH (ref 4.0–10.5)
nRBC: 0.1 % (ref 0.0–0.2)

## 2023-10-14 LAB — GLUCOSE, CAPILLARY
Glucose-Capillary: 116 mg/dL — ABNORMAL HIGH (ref 70–99)
Glucose-Capillary: 107 mg/dL — ABNORMAL HIGH (ref 70–99)
Glucose-Capillary: 122 mg/dL — ABNORMAL HIGH (ref 70–99)
Glucose-Capillary: 107 mg/dL — ABNORMAL HIGH (ref 70–99)
Glucose-Capillary: 110 mg/dL — ABNORMAL HIGH (ref 70–99)
Glucose-Capillary: 127 mg/dL — ABNORMAL HIGH (ref 70–99)

## 2023-10-14 MED ORDER — PROSOURCE TF20 ENFIT COMPATIBL EN LIQD
60.0000 mL | Freq: Every day | ENTERAL | Status: DC
  Administered 2023-10-14 – 2023-10-18 (×5): 60 mL
  Filled 2023-10-14 (×5): qty 60

## 2023-10-14 MED ORDER — OSMOLITE 1.5 CAL PO LIQD
1000.0000 mL | ORAL | Status: DC
  Administered 2023-10-14: 1000 mL

## 2023-10-14 MED ORDER — FERRIC CITRATE 1 GM 210 MG(FE) PO TABS: 630.0000 mg | ORAL_TABLET | Freq: Three times a day (TID) | ORAL | Status: DC

## 2023-10-14 MED ORDER — LANTHANUM CARBONATE 500 MG PO CHEW
1000.0000 mg | CHEWABLE_TABLET | Freq: Three times a day (TID) | ORAL | Status: DC
  Administered 2023-10-14 – 2023-10-17 (×10): 1000 mg
  Filled 2023-10-14 (×16): qty 2

## 2023-10-14 NOTE — Progress Notes (Signed)
 eLink Physician-Brief Progress Note Patient Name: Nathan Campbell DOB: [birth year] MRN: [id number]   Date of Service  10/14/2023  HPI/Events of Note   pulled out his NGT  eICU Interventions  No intervention, maintain n.p.o. for now.      Intervention Category Minor Interventions: Routine modifications to care plan (e.g. PRN medications for pain, fever)  Aayat Hajjar 10/14/2023, 3:13 AM

## 2023-10-14 NOTE — Progress Notes (Signed)
 Nutrition Follow-up  DOCUMENTATION CODES:   Obesity unspecified  INTERVENTION:   Initiate trickle TF via Cortrak tube:  Osmolite 1.5 at 25 ml/h   As tolerated increase by 10 ml every 8 hours to goal rate of 65 ml/hr (1560 ml per day)  Prosource TF20 60 ml daily  Provides 2420 kcal, 117 gm protein, 1185 ml free water daily   NUTRITION DIAGNOSIS:   Inadequate oral intake related to inability to eat as evidenced by NPO status. Ongoing.   GOAL:   Patient will meet greater than or equal to 90% of their needs Not met due to ileus  MONITOR:   TF tolerance  REASON FOR ASSESSMENT:   Consult Enteral/tube feeding initiation and management  ASSESSMENT:   Pt with PMH of obesity, HTN, end-stage renal dz on HD TTS, combined systolic and diastolic heart failure admitted 7/25 with L-sided weakness dx with acute R MCA infarct.   Pt discussed with RN and NP. Ok to resume trickle TF. Per RN NG tube was removed by pt but cortrak remained in place. Abd xray ordered to confirm location of cortrak prior to re-starting TF.  EDW 138 kg Noted last outpatient post HD weight: 142 kg  Pt has now had 2 bms after lactulose .  Pt on salter with 6 L O2.   7/25 - admitted acute R MCA s/p TNK and mechanical thrombectomy; intubated 7/27 - TF protocol started 7/28 - cortrak placed; tip gastric  7/29 - extubated  7/30 - NG tube placed for decompression; gastric per xray 8/1 - pt pulled out NG; start trickle TF via cortrak tube    Medications reviewed and include: colace, SSI every 4 hours, protonix    Labs reviewed:  K 4.3 BUN 72 Cr 13.26 Phos 11.4 A1C 5.1 CBG: 100-152  UF: 5400 ml  UOP: 100 ml   Diet Order:   Diet Order             Diet NPO time specified  Diet effective now                   EDUCATION NEEDS:   Not appropriate for education at this time  Skin:  Skin Assessment: Reviewed RN Assessment  Last BM:  7/31 type 7 medium; 8/1 type 7 small  Height:   Ht  Readings from Last 1 Encounters:  10/10/23 6' (1.829 m)    Weight:   Wt Readings from Last 1 Encounters:  10/14/23 129.3 kg    BMI:  Body mass index is 38.66 kg/m.  Estimated Nutritional Needs:   Kcal:  2300-2500  Protein:  100-120 grams  Fluid:  1.2 L/day  Powell SQUIBB., RD, LDN, CNSC See AMiON for contact information

## 2023-10-14 NOTE — Consult Note (Signed)
 Physical Medicine and Rehabilitation Consult Reason for Consult: Consult  Referring Physician: Dr. Adolph   HPI: Nathan Campbell is a 48 y.o. male with past medical history of systolic and diastolic CHF, anemia, ESRD on hemodialysis, hypertension, obesity who presented for left-sided weakness and neglect.  In the ED he was agitated and restless, intubated for airway protection.  CT head with acute right MCA territory CVA in addition to a calcific density proximal right M1 segment concerning for calcific embolus.  Patient was given TNK and cerebral catheter angiogram showed right M1 occlusion and patient underwent thrombectomy of right M1 TICI 2B revascularization of occluded right middle cerebral artery M1 segment, the superior division and inferior division to the right M2 region. MRI showed large right MCA territory CVA, with associated diffuse cortical thickening and some mass effect, no significant midline shift.  MRI showed distal right M2 occlusion, no new occlusion.  Patient was extubated on 7/29.  On 7/30 patient was noted to have vomited and aspirated.  Had NG tube placed.  Abdomen x-ray indicated ileus, 8/1 having bowel movements.  Patient is n.p.o and tube feeds have been restarted.  Patient was seen by PT, OT and SLP and found to have functional deficits.  Patient ambulated 100 feet min assist.  Max to total assist for bathing and dressing.  Per chart review patient lives in a apartment with 1 flight of stairs to enter.  Patient indicates he lives with his sister but it does not sound like she would be able to provide significant assistance after discharge.  Patient indicates his mother may be able to help him.  Home: Home Living Family/patient expects to be discharged to:: Private residence Living Arrangements: Alone Available Help at Discharge:  (unknown) Type of Home: Apartment Home Access: Stairs to enter Entrance Stairs-Number of Steps: 1 flight Entrance Stairs-Rails: Right,  Left Home Layout: One level Home Equipment: None  Lives With:  (unknown)  Functional History: Prior Function Prior Level of Function : Independent/Modified Independent, Driving Mobility Comments: no use of DME ADLs Comments: independent, driving himself to HD Functional Status:  Mobility: Bed Mobility Overal bed mobility: Needs Assistance Bed Mobility: Rolling, Sidelying to Sit Rolling: Contact guard assist, Used rails Sidelying to sit: Min assist, HOB elevated, Used rails Supine to sit: Total assist, HOB elevated Sit to sidelying: Mod assist, +2 for safety/equipment General bed mobility comments: pt able to roll to left with cues to use rail and bring trunk fully over, min assist to clear RLE off bed and fully push trunk off surface with HOB 30 degrees Transfers Overall transfer level: Needs assistance Equipment used: None Transfers: Sit to/from Stand Sit to Stand: Min assist, +2 safety/equipment Bed to/from chair/wheelchair/BSC transfer type:: Step pivot Step pivot transfers: Mod assist General transfer comment: min +2 to stand from bed with lt knee blocked and bil HHA to pivot toward Rt to chair. pt able to stand from recliner with use of Rt armrest with min assist x 2 trials. Ambulation/Gait Ambulation/Gait assistance: Min assist, +2 safety/equipment Gait Distance (Feet): 100 Feet Assistive device: 2 person hand held assist Gait Pattern/deviations: Trunk flexed, Step-through pattern, Narrow base of support General Gait Details: decreased left attention with step through gait, narrow BOS and bil UE supported on therapist and tech with chair follow. cues for direction and safety Gait velocity interpretation: <1.8 ft/sec, indicate of risk for recurrent falls    ADL: ADL Overall ADL's : Needs assistance/impaired Eating/Feeding: NPO Grooming: Minimal assistance, Sitting Upper  Body Bathing: Maximal assistance, Sitting Lower Body Bathing: Maximal assistance, Bed level Upper  Body Dressing : Maximal assistance, Sitting Lower Body Dressing: Total assistance, Bed level Toilet Transfer: Maximal assistance, +2 for physical assistance, Total assistance Functional mobility during ADLs: Maximal assistance, +2 for physical assistance, Total assistance  Cognition: Cognition Overall Cognitive Status: No family/caregiver present to determine baseline cognitive functioning Arousal/Alertness:  (awake, struggling to manage secretions) Orientation Level: Oriented X4 Cognition Arousal: Alert Behavior During Therapy: Flat affect Overall Cognitive Status: No family/caregiver present to determine baseline cognitive functioning   Review of Systems  Constitutional:  Negative for fever.  Eyes:  Negative for blurred vision.  Respiratory:  Negative for shortness of breath.   Cardiovascular:  Negative for chest pain.  Gastrointestinal:  Negative for abdominal pain, nausea and vomiting.  Musculoskeletal:  Positive for myalgias.  Neurological:  Positive for sensory change, speech change and focal weakness.   Past Medical History:  Diagnosis Date   Acute combined systolic and diastolic CHF, NYHA class 4 (HCC) 06/10/2016   Anemia    Cardiomyopathy (HCC) 06/14/2016   CHF (congestive heart failure) (HCC)    COVID 10/2020   Dyspnea    ESRD on hemodialysis (HCC)    TTS at [facility]   Hypertension    Hypertensive heart and kidney disease with heart failure (HCC) 06/14/2016   Hypertensive heart disease with congestive heart failure (HCC)    Obesity    Past Surgical History:  Procedure Laterality Date   A/V FISTULAGRAM Left 05/03/2022   Procedure: A/V Fistulagram;  Surgeon: Eliza Lonni RAMAN, MD;  Location: [facility] INVASIVE CV LAB;  Service: Cardiovascular;  Laterality: Left;   AV FISTULA PLACEMENT Left 11/11/2020   Procedure: LEFT ARM First Stage Basilic Vein Fistula Creation;  Surgeon: Sheree Penne Lonni, MD;  Location: [facility] OR;  Service: Vascular;  Laterality: Left;    BASCILIC VEIN TRANSPOSITION Left 01/23/2021   Procedure: Second Stage Basilic Vein Transposition of Left Arm Arteriovenous  Fistula;  Surgeon: Sheree Penne Lonni, MD;  Location: [facility] OR;  Service: Vascular;  Laterality: Left;  Block  to LMA    COLONOSCOPY     FISTULA SUPERFICIALIZATION Left 05/07/2022   Procedure: PLICATION OF ANEURYSM, LEFT ARM FISTULA;  Surgeon: Eliza Lonni RAMAN, MD;  Location: [facility] OR;  Service: Vascular;  Laterality: Left;   IR CT HEAD LTD  10/07/2023   IR CT HEAD LTD  10/07/2023   IR PATIENT EVAL TECH 0-60 MINS  10/07/2023   IR PERCUTANEOUS ART THROMBECTOMY/INFUSION INTRACRANIAL INC DIAG ANGIO  10/07/2023   NO PAST SURGERIES     PERIPHERAL VASCULAR BALLOON ANGIOPLASTY Left 05/03/2022   Procedure: PERIPHERAL VASCULAR BALLOON ANGIOPLASTY;  Surgeon: Eliza Lonni RAMAN, MD;  Location: [facility] INVASIVE CV LAB;  Service: Cardiovascular;  Laterality: Left;  AVF   RADIOLOGY WITH ANESTHESIA N/A 10/07/2023   Procedure: RADIOLOGY WITH ANESTHESIA;  Surgeon: Radiologist, Medication, MD;  Location: [facility] OR;  Service: Radiology;  Laterality: N/A;   Family History  Problem Relation Age of Onset   Hypertension Mother    Diabetes Father    Hypertension Father    Hypertension Sister    Hypertension Paternal Grandmother    Diabetes Mellitus II Paternal Grandfather    Heart disease Cousin    Heart disease Cousin    Colon cancer Cousin    Esophageal cancer Neg Hx    Rectal cancer Neg Hx    Stomach cancer Neg Hx    Social History:  reports that he has been smoking cigarettes.  He has never used smokeless tobacco. He reports that he does not currently use alcohol. He reports that he does not currently use drugs. Allergies: No Known Allergies Facility-Administered Medications Prior to Admission  Medication Dose Route Frequency Provider Last Rate Last Admin   0.9 %  sodium chloride  infusion  250 mL Intravenous PRN Sheree Penne Bruckner, MD   500 mL at 10/07/23 1044   sodium chloride   flush (NS) 0.9 % injection 3 mL  3 mL Intravenous Q12H Sheree Penne Bruckner, MD       Medications Prior to Admission  Medication Sig Dispense Refill   acetaminophen  (TYLENOL ) 325 MG tablet Take 2 tablets (650 mg total) by mouth every 6 (six) hours as needed for moderate pain. 30 tablet 0   allopurinol  (ZYLOPRIM ) 100 MG tablet Take 1 tablet (100 mg total) by mouth daily. Do not begin until pain in right knee is resolved. 30 tablet 1   amLODipine  (NORVASC ) 10 MG tablet Take 1 tablet (10 mg total) by mouth daily. 30 tablet 1   AURYXIA  1 GM 210 MG(Fe) tablet Take 420-630 mg by mouth See admin instructions. Take 630 mg with each meal and 420 to 630 mg with each snack     B Complex-C-Zn-Folic Acid (DIALYVITE 800/ZINC PO) Take 1 tablet by mouth daily.     benzonatate  (TESSALON ) 100 MG capsule Take 1 capsule (100 mg total) by mouth 3 (three) times daily as needed for cough. 30 capsule 0   carvedilol  (COREG ) 25 MG tablet Take 25 mg by mouth 2 (two) times daily.     guaiFENesin -dextromethorphan (ROBITUSSIN DM) 100-10 MG/5ML syrup Take 5 mLs by mouth 3 (three) times daily as needed for cough. 118 mL 0   isosorbide  mononitrate (ISMO ) 10 MG tablet Take 10 mg by mouth daily.     omeprazole  (PRILOSEC) 20 MG capsule Take 1 capsule (20 mg total) by mouth daily. Take after dialysis on dialysis days 30 capsule 0   promethazine -dextromethorphan (PROMETHAZINE -DM) 6.25-15 MG/5ML syrup Take 10 mLs by mouth every 6 (six) hours as needed for cough. 118 mL 0   torsemide  (DEMADEX ) 20 MG tablet Take 60 mg by mouth 2 (two) times daily.       Blood pressure (!) 112/39, pulse 99, temperature 99.4 F (37.4 C), temperature source Oral, resp. rate (!) 29, height 6' (1.829 m), weight 129.3 kg, SpO2 91%. Physical Exam General: No apparent distress, obese HEENT: Head is normocephalic, atraumatic, sclera anicteric, oral mucosa pink and moist Neck: Supple without JVD or lymphadenopathy Heart: Mild tachycardia. No murmurs  rubs or gallops Chest: CTA bilaterally without wheezes, rales, or rhonchi; no distress.  2 L O2 [place].  Core track in place abdomen: Soft, non-tender, protuberant , bowel sounds positive. Extremities: No clubbing, cyanosis, or edema.  + Left FVF Psych: Pt's affect is flat Skin: Clean and intact without signs of breakdown Neuro:    Mental Status: AAOx4, Drowsy, delayed responses Speech/Languate: + Moderate to severe dysarthria, usually answers simple yes no questions.  Answers 1 word at a time.  Slow rate of speech.  Limited verbal output.  Expressive greater than receptive aphasia, following most simple commands CRANIAL NERVES: II: PERRL III, IV, VI: EOM intact, right gaze preference V: Altered left face VII: left facial weakness VIII: normal hearing to speech IX, X: normal palatal elevation XI: 5/5 head turn and decreased L shoulder shrug XII: Tongue to left    MOTOR: RUE: 5/5 Deltoid, 5/5 Biceps, 5/5 Triceps,5/5 Grip LUE: 1/5 Deltoid, 1/5 Biceps,  4/5 Triceps, 0/5 Grip RLE: HF 5/5, KE 5/5, ADF and APF at least 4 out of 5 LLE: HF 4/5, KE 4/5, ADF and APF 1 out of 5 however unsure if this is due to difficulty following commands   REFLEXES: No ankle clonus  SENSORY: Normal to touch all 4 extremities  Coordination: Right upper extremity with no gross ataxia    Results for orders placed or performed during the hospital encounter of 10/07/23 (from the past 24 hours)  Glucose, capillary     Status: Abnormal   Collection Time: 10/13/23  3:45 PM  Result Value Ref Range   Glucose-Capillary 122 (H) 70 - 99 mg/dL  Glucose, capillary     Status: Abnormal   Collection Time: 10/13/23  7:33 PM  Result Value Ref Range   Glucose-Capillary 112 (H) 70 - 99 mg/dL  Glucose, capillary     Status: Abnormal   Collection Time: 10/13/23 11:44 PM  Result Value Ref Range   Glucose-Capillary 125 (H) 70 - 99 mg/dL  Glucose, capillary     Status: Abnormal   Collection Time: 10/14/23  3:26 AM  Result  Value Ref Range   Glucose-Capillary 116 (H) 70 - 99 mg/dL  CBC     Status: Abnormal   Collection Time: 10/14/23  6:18 AM  Result Value Ref Range   WBC 16.2 (H) 4.0 - 10.5 K/uL   RBC 3.80 (L) 4.22 - 5.81 MIL/uL   Hemoglobin 11.9 (L) 13.0 - 17.0 g/dL   HCT 61.7 (L) 60.9 - 47.9 %   MCV 100.5 (H) 80.0 - 100.0 fL   MCH 31.3 26.0 - 34.0 pg   MCHC 31.2 30.0 - 36.0 g/dL   RDW 86.7 88.4 - 84.4 %   Platelets 350 150 - 400 K/uL   nRBC 0.1 0.0 - 0.2 %  Basic metabolic panel with GFR     Status: Abnormal   Collection Time: 10/14/23  6:18 AM  Result Value Ref Range   Sodium 140 135 - 145 mmol/L   Potassium 4.3 3.5 - 5.1 mmol/L   Chloride 91 (L) 98 - 111 mmol/L   CO2 25 22 - 32 mmol/L   Glucose, Bld 114 (H) 70 - 99 mg/dL   BUN 72 (H) 6 - 20 mg/dL   Creatinine, Ser 86.73 (H) 0.61 - 1.24 mg/dL   Calcium  10.8 (H) 8.9 - 10.3 mg/dL   GFR, Estimated 4 (L) >60 mL/min   Anion gap 24 (H) 5 - 15  Glucose, capillary     Status: Abnormal   Collection Time: 10/14/23  7:34 AM  Result Value Ref Range   Glucose-Capillary 107 (H) 70 - 99 mg/dL  Glucose, capillary     Status: Abnormal   Collection Time: 10/14/23 12:13 PM  Result Value Ref Range   Glucose-Capillary 122 (H) 70 - 99 mg/dL   DG Abd Portable 1V Result Date: 10/14/2023 CLINICAL DATA:  [id number] Encounter for feeding tube placement [id number] EXAM: PORTABLE ABDOMEN - 1 VIEW COMPARISON:  10/12/2023 abdomen radiograph FINDINGS: Enteric tube courses into the distal stomach with weighted tip in the medial right abdomen likely in the region of the pylorus. No evidence of pneumoperitoneum. IMPRESSION: Enteric tube courses into the distal stomach with weighted tip in the medial right abdomen likely in the region of the pylorus. Electronically Signed   By: Selinda DELENA Blue M.D.   On: 10/14/2023 10:04    Assessment/Plan: Diagnosis: Right MCA CVA s/p mechanical thrombectomy and TNK Does the need for close, 24  hr/day medical supervision in concert with the patient's  rehab needs make it unreasonable for this patient to be served in a less intensive setting? Yes Co-Morbidities requiring supervision/potential complications:  - Systolic and diastolic heart failure, hypertension, pulmonary hypertension, hyperlipidemia, elevated triglycerides, dysphagia, ileus, ESRD on HD, obesity Due to bladder management, bowel management, safety, skin/wound care, disease management, medication administration, pain management, and patient education, does the patient require 24 hr/day rehab nursing? Yes Does the patient require coordinated care of a physician, rehab nurse, therapy disciplines of PT, OT, SLP to address physical and functional deficits in the context of the above medical diagnosis(es)? Yes Addressing deficits in the following areas: balance, endurance, locomotion, strength, transferring, bowel/bladder control, bathing, dressing, feeding, grooming, toileting, cognition, speech, language, swallowing, and psychosocial support Can the patient actively participate in an intensive therapy program of at least 3 hrs of therapy per day at least 5 days per week? Yes The potential for patient to make measurable gains while on inpatient rehab is excellent Anticipated functional outcomes upon discharge from inpatient rehab are supervision and min assist  with PT, supervision and min assist with OT, supervision and min assist with SLP. Estimated rehab length of stay to reach the above functional goals is: 18-22 Anticipated discharge destination: Other Overall Rehab/Functional Prognosis: good  POST ACUTE RECOMMENDATIONS: This patient's condition is appropriate for continued rehabilitative care in the following setting: CIR and SNF Patient has agreed to participate in recommended program. Yes Note that insurance prior authorization may be required for reimbursement for recommended care.  Comment: I think patient may be a candidate for CIR if he has family support on discharge.   Rehab coordinator to follow-up.  Otherwise he would likely be candidate for SNF rehabilitation.   MEDICAL RECOMMENDATIONS: Consider left  WHO/PRAFO   I have personally performed a face to face diagnostic evaluation of this patient. Additionally, I have examined the patient's medical record including any pertinent labs and radiographic images.    Thanks,  Murray Collier, MD 10/14/2023

## 2023-10-14 NOTE — Progress Notes (Signed)
 Physical Therapy Treatment Patient Details Name: Nathan Campbell MRN: [id number] DOB: 11/26/1975 Today's Date: 10/14/2023   History of Present Illness 48 yo male adm 10/07/23 with Lt-sided weakness and slurred speech with Rt MCA CVA sp TNK and IR revascularization. Intubated 7/25-7/29. 7/29 ileus. PMH: CHF, CM, ESRD on HD TTS, HTN, obesity, HLD    PT Comments  Pt with significant activity progression able to initiate gait with UB support. Pt remains with impaired speech, LUE weakness and left inattention needing cues and assist to position and control trunk and LUE. Pt on 3L on arrival and able to walk on RA with SPO2 92% with RN aware. Pt fatigued end of session and drifting to sleep after gait. Pt with cues to control LLE movement with HEp and encouraged OOB with nursing staff.   HR 106-115 Supine 167/93  Sitting 127/78 with pt asymptomatic Post gait 141/79    If plan is discharge home, recommend the following: Two people to help with bathing/dressing/bathroom;Assistance with cooking/housework;Assistance with feeding;Assist for transportation;Help with stairs or ramp for entrance;A lot of help with walking and/or transfers   Can travel by private vehicle        Equipment Recommendations  Other (comment) (quad cane)    Recommendations for Other Services       Precautions / Restrictions Precautions Precautions: Fall Recall of Precautions/Restrictions: Impaired Precaution/Restrictions Comments: cortrak, watch SpO2 and RR, left inattention     Mobility  Bed Mobility Overal bed mobility: Needs Assistance Bed Mobility: Rolling, Sidelying to Sit Rolling: Contact guard assist, Used rails Sidelying to sit: Min assist, HOB elevated, Used rails       General bed mobility comments: pt able to roll to left with cues to use rail and bring trunk fully over, min assist to clear RLE off bed and fully push trunk off surface with HOB 30 degrees    Transfers Overall transfer level: Needs  assistance   Transfers: Sit to/from Stand Sit to Stand: Min assist, +2 safety/equipment           General transfer comment: min +2 to stand from bed with lt knee blocked and bil HHA to pivot toward Rt to chair. pt able to stand from recliner with use of Rt armrest with min assist x 2 trials.    Ambulation/Gait Ambulation/Gait assistance: Min assist, +2 safety/equipment Gait Distance (Feet): 100 Feet Assistive device: 2 person hand held assist Gait Pattern/deviations: Trunk flexed, Step-through pattern, Narrow base of support   Gait velocity interpretation: <1.8 ft/sec, indicate of risk for recurrent falls   General Gait Details: decreased left attention with step through gait, narrow BOS and bil UE supported on therapist and tech with chair follow. cues for direction and safety   Stairs             Wheelchair Mobility     Tilt Bed    Modified Rankin (Stroke Patients Only) Modified Rankin (Stroke Patients Only) Pre-Morbid Rankin Score: No symptoms Modified Rankin: Moderately severe disability     Balance Overall balance assessment: Needs assistance Sitting-balance support: Bilateral upper extremity supported Sitting balance-Leahy Scale: Poor Sitting balance - Comments: UB support EOB   Standing balance support: Bilateral upper extremity supported Standing balance-Leahy Scale: Poor Standing balance comment: UB support in standing                            Communication Communication Communication: Impaired Factors Affecting Communication: Difficulty expressing self  Cognition Arousal: Alert Behavior  During Therapy: Flat affect   PT - Cognitive impairments: Difficult to assess Difficult to assess due to: Impaired communication                     PT - Cognition Comments: pt consistent with yes/no nodding. Voice gurgling and difficult to understand at times. States name, month   Following commands impaired: Only follows one step  commands consistently    Cueing Cueing Techniques: Verbal cues, Tactile cues  Exercises General Exercises - Lower Extremity Long Arc Quad: AROM, Left, Seated, 15 reps, Strengthening Hip ABduction/ADduction: AAROM, Left, 10 reps, Seated Hip Flexion/Marching: AROM, Left, 10 reps, Seated    General Comments        Pertinent Vitals/Pain Pain Assessment Pain Score: 4  Pain Location: left shoulder Pain Descriptors / Indicators: Grimacing, Guarding Pain Intervention(s): Monitored during session    Home Living                          Prior Function            PT Goals (current goals can now be found in the care plan section) Progress towards PT goals: Progressing toward goals    Frequency    Min 3X/week      PT Plan      Co-evaluation              AM-PAC PT 6 Clicks Mobility   Outcome Measure  Help needed turning from your back to your side while in a flat bed without using bedrails?: A Little Help needed moving from lying on your back to sitting on the side of a flat bed without using bedrails?: A Little Help needed moving to and from a bed to a chair (including a wheelchair)?: A Lot Help needed standing up from a chair using your arms (e.g., wheelchair or bedside chair)?: A Little Help needed to walk in hospital room?: Total Help needed climbing 3-5 steps with a railing? : Total 6 Click Score: 13    End of Session Equipment Utilized During Treatment: Gait belt Activity Tolerance: Patient tolerated treatment well Patient left: with call bell/phone within reach;in chair;with chair alarm set;with nursing/sitter in room Nurse Communication: Mobility status PT Visit Diagnosis: Unsteadiness on feet (R26.81);Other abnormalities of gait and mobility (R26.89);Muscle weakness (generalized) (M62.81);Hemiplegia and hemiparesis Hemiplegia - Right/Left: Left Hemiplegia - dominant/non-dominant: Non-dominant Hemiplegia - caused by: Cerebral infarction      Time: 0829-0857 PT Time Calculation (min) (ACUTE ONLY): 28 min  Charges:    $Gait Training: 8-22 mins $Therapeutic Activity: 8-22 mins PT General Charges $$ ACUTE PT VISIT: 1 Visit                     Lenoard SQUIBB, PT Acute Rehabilitation Services Office: [phone]    Liah Morr B Radonna Bracher 10/14/2023, 10:08 AM

## 2023-10-14 NOTE — Progress Notes (Addendum)
 STROKE TEAM PROGRESS NOTE  Hospital events 7/25: Patient admitted with right MCA territory infarct, given TNK and taken to interventional radiology for mechanical thrombectomy 7/29: Patient extubated 7/29: Ileus, tachypnea, now on heated high flow 8/1: Patient transferred out of ICU  INTERIM HISTORY/SUBJECTIVE Patient remains hemodynamically stable with Tmax of 99.4.  He is ready to be transferred out of the ICU today.  He inadvertently removed his NG tube overnight.  He is having bowel movements, so ileus may be resolving.  He is able to state his name and his age but no other verbal output OBJECTIVE  CBC    Component Value Date/Time   WBC 16.2 (H) 10/14/2023 0618   RBC 3.80 (L) 10/14/2023 0618   HGB 11.9 (L) 10/14/2023 0618   HGB 9.2 (L) 01/22/2021 1235   HCT 38.2 (L) 10/14/2023 0618   HCT 28.9 (L) 01/22/2021 1235   PLT 350 10/14/2023 0618   PLT 208 01/22/2021 1235   MCV 100.5 (H) 10/14/2023 0618   MCV 93 01/22/2021 1235   MCH 31.3 10/14/2023 0618   MCHC 31.2 10/14/2023 0618   RDW 13.2 10/14/2023 0618   RDW 12.0 01/22/2021 1235   LYMPHSABS 1.1 10/10/2023 0625   LYMPHSABS 0.8 01/22/2021 1235   MONOABS 1.6 (H) 10/10/2023 0625   EOSABS 0.3 10/10/2023 0625   EOSABS 0.1 01/22/2021 1235   BASOSABS 0.0 10/10/2023 0625   BASOSABS 0.0 01/22/2021 1235    BMET    Component Value Date/Time   NA 140 10/14/2023 0618   K 4.3 10/14/2023 0618   CL 91 (L) 10/14/2023 0618   CO2 25 10/14/2023 0618   GLUCOSE 114 (H) 10/14/2023 0618   BUN 72 (H) 10/14/2023 0618   CREATININE 13.26 (H) 10/14/2023 0618   CALCIUM  10.8 (H) 10/14/2023 0618   GFRNONAA 4 (L) 10/14/2023 0618    IMAGING past 24 hours DG Abd Portable 1V Result Date: 10/14/2023 CLINICAL DATA:  [id number] Encounter for feeding tube placement [id number] EXAM: PORTABLE ABDOMEN - 1 VIEW COMPARISON:  10/12/2023 abdomen radiograph FINDINGS: Enteric tube courses into the distal stomach with weighted tip in the medial right abdomen likely in the  region of the pylorus. No evidence of pneumoperitoneum. IMPRESSION: Enteric tube courses into the distal stomach with weighted tip in the medial right abdomen likely in the region of the pylorus. Electronically Signed   By: Selinda DELENA Blue M.D.   On: 10/14/2023 10:04      Vitals:   10/14/23 1000 10/14/23 1019 10/14/23 1100 10/14/23 1312  BP: 108/77  (!) 108/52 (!) 112/39  Pulse: 88 99    Resp: (!) 32 (!) 33 (!) 26 (!) 29  Temp:      TempSrc:      SpO2: 95% 91%    Weight:      Height:         PHYSICAL EXAM General: Obese middle-aged African-American male.   CV: Regular rate and rhythm on monitor Respiratory: Respirations regular and unlabored on room air GI: Abdomen soft and nontender   NEURO:  Mental Status: Able to follow one step commands, midline and with right upper extremity and bilateral lower extremities Speech/Language:Following commands.  Able to state name and age but no other verbal output  Cranial Nerves:  II:  PERRL III, IV, VI: Right gaze preference but able to cross midline. Eyelids elevate symmetrically.   VIII: hearing intact to voice. KP:Yzji midline  XII: tongue is midline without fasciculations. Motor:  Moves right upper and lower extremity and left lower  extremity purposefully against gravity, no movement of left upper extremity Tone: is normal and bulk is normal Sensation-intact to light touch throughout Coordination: no gross ataxia on the right Gait- deferred   ASSESSMENT/PLAN  Mr. Lupe Bonner is a 48 y.o. male with history of obesity, hypertension, end-stage renal disease on HD TTS, combined systolic and diastolic heart failure who presented to the emergency department on 7/25 with left-sided weakness, slurred speech.  NIH on Admission 13. IV TNK after CT head showed what looked like a calcific embolus in the right MCA and a good ASPECT  score and CT angiogram could not be obtained with emergent cerebral catheter angiogram showed right M1  occlusion and he underwent  TICI 2 b revascularization of the superior and inferior division to the M2 region but was left intubated for medical issues given history of CHF and renal failure.  He has since been extubated and is ready to be transferred out of the ICU.  Acute Ischemic Infarct:  right MCA territory infarct  s/p mechanical thrombectomy and TNK Etiology: Embolic versus large vessel disease Code Stroke CT head - Findings consistent with acute right MCA territory ischemic changes, ASPECT score 7. And calcific density is noted in the proximal right M1 segment. This is most concerning for a calcific embolus. Intracranial atherosclerotic changes considered less likely. MRI   Large right MCA territory infarct with associated diffuse cortical thickening and some mass effect MRA  Distal right M2 occlusion, somewhat more proximal than on the final angio images. No new occlusion is present. Repeat CT Head-  Expected evolution of the large right MCA territory infarct with effacement of the sulci and hyperdense thrombus within sylvian branches of the right MCA. No significant midline shift. 2D Echo LA mildly dilated, right atria fairly dilated.  EF 50-55% LDL 85 HgbA1c 5.1 VTE prophylaxis -heparin  No antithrombotic prior to admission, now on aspirin  81 mg daily and aspirin  300 mg suppository daily  Therapy recommendations:  Pending Disposition:  Neuro ICU  S/p thrombectomy of R M1 with TICI 2B revascularization  Intubated prior to procedure due to agitation   Acute hypoxic and hypercapnic respiratory failure extubated to high flow nasal cannula PCCM following   Combined systolic and diastolic heart failure Hypertension Pulmonary hypertension Home meds: Amlodipine , isosorbide  mononitrate, furosemide , carvedilol  Resume GDMT when BP stabilized Stable Blood Pressure Goal: BP less than 180/105   Hyperlipidemia LDL 85 goal < 70 Add atorvastatin Continue statin at discharge  Elevated  triglycerides [phone]  Dysphagia NPO Tube feeding started  Ileus  NG inadvertently removed overnight Patient is having bowel movements Reglan  5mg  q6 Restart trickle tube feeds today  ESRD on HD Nephrology consulted HD Tuesday Thursday Saturday   Hospital day # 7  Patient seen and examined by NP/APP with MD. MD to update note as needed.   Cortney E Everitt Clint Kill , MSN, AGACNP-BC [facility] See Amion for schedule and pager information 10/14/2023 1:56 PM   I have personally obtained history,examined this patient, reviewed notes, independently viewed imaging studies, participated in medical decision making and plan of care.ROS completed by me personally and pertinent positives fully documented  I have made any additions or clarifications directly to the above note. Agree with note above.  Patient continues to show slow neurological improvement and is now able to speak a few words and short sentences.  Left upper extremity still remains plegic though he is able to move left lower extremity off the bed.  He is able to ambulate  a bit with therapy.  Recommend continue ongoing therapies and transfer to inpatient rehab when bed available.  Transfer out of ICU to medical progressive unit.  We will ask t medical hospitalist team to consult and assume care given multiple medical problems.   I personally spent a total of 50 minutes in the care of the patient today including getting/reviewing separately obtained history, performing a medically appropriate exam/evaluation, counseling and educating, placing orders, referring and communicating with other health care professionals, documenting clinical information in the EHR, independently interpreting results, and coordinating care.        Eather Popp, MD Medical Director [facility] Pager: [phone] 10/14/2023 3:18 PM

## 2023-10-14 NOTE — Progress Notes (Addendum)
 Speech Language Pathology Treatment: Cognitive-Linguistic  Patient Details Name: Nathan Campbell MRN: [id number] DOB: 18-Apr-1975 Today's Date: 10/14/2023 Time: 8954-8941 SLP Time Calculation (min) (ACUTE ONLY): 13 min  Assessment / Plan / Recommendation Clinical Impression  Pt seen for brief session of skilled ST intervention targeting goals for communication of wants and needs. Pt demonstrates improvement since last seen by this SLP. Improved secretion management noted today. Speech continues moderate-severely dysarthric. Simple/basic yes/no responses are correct, however, responses to complex and abstract yes/no questions are not accurate. He was able to follow 1- and 2-step commands today, but was unable to follow 3-step verbal commands. Accuracy increases given multimodality cues. Pt verbalized being in the hospital due to stroke, but was unable to verbalize current year. He verbalized his name, but inaccurate for birth date. He was able to answer questions re: place of work, family. Of note, his speech is significantly dysarthric, and his alertness declined quickly during this session. He was unable to demonstrate overarticulation or slower speech rate following cues. ST will continue to follow acutely to maximize effective communication and cognitive-linguistic function.   Orders received for bedside swallow evaluation. Deferred at this time due to lethargy. RN aware. Pt with Cortrak in place.   HPI HPI: 48yo male admitted 10/07/23 with acute onset left side weakness and neglect. Pt intubated 7/25-29/25.  PMH: CHF, anemia, ESRD on HD, HTN, obesity. MRI - large right MCA infarct      SLP Plan  Continue with current plan of care    Recommendations   Continue speech therapy acutely. Request orders for bedside swallow evaluation when mentation/ileus allows.         Other (comment) (TBD) Cognitive communication deficit (R41.841)     Continue with current plan of care    Danyel Tobey B.  Dory, MSP, CCC-SLP Speech Language Pathologist Office: [phone]  Dory Caprice Daring 10/14/2023, 11:02 AM

## 2023-10-14 NOTE — Progress Notes (Addendum)
 [facility] Progress Note   Subjective:  Seen in room - sitting in recliner. Nasal O2 in place. Indicates abd pain and that needs to have BM - RN informed.   Objective Vitals:   10/14/23 0600 10/14/23 0700 10/14/23 0800 10/14/23 0900  BP: (!) 141/104 (!) 149/85 (!) 167/93 120/67  Pulse: 92 98 100 (!) 105  Resp: (!) 25 17 (!) 22 (!) 35  Temp:   99.4 F (37.4 C)   TempSrc:   Oral   SpO2: 98% 96% 100% 91%  Weight:      Height:       Physical Exam General: Overweight man, awake, NAD. NG tube clamped.  O2 in place Heart: RRR; no murmur Lungs: CTA anteriorly Abdomen: soft, distended Extremities: no LE edema Dialysis Access: AVF +t/b  Additional Objective Labs: Basic Metabolic Panel: Recent Labs  Lab 10/09/23 0610 10/10/23 0625 10/12/23 0605 10/12/23 1426 10/13/23 0552 10/14/23 0618  NA 136   < > 136 137 142 140  K 4.7   < > 4.1 3.9 4.3 4.3  CL 94*   < > 94*  --  95* 91*  CO2 24   < > 23  --  21* 25  GLUCOSE 97   < > 122*  --  116* 114*  BUN 53*   < > 60*  --  87* 72*  CREATININE 11.53*   < > 12.03*  --  15.18* 13.26*  CALCIUM  9.6   < > 10.0  --  10.5* 10.8*  PHOS 8.8*  --   --   --  11.4*  --    < > = values in this interval not displayed.   Liver Function Tests: Recent Labs  Lab 10/08/23 0505  AST 10*  ALT 11  ALKPHOS 66  BILITOT 0.8  PROT 6.1*  ALBUMIN 3.1*   CBC: Recent Labs  Lab 10/08/23 0505 10/10/23 0625 10/11/23 0547 10/12/23 0605 10/12/23 1426 10/13/23 0552 10/14/23 0618  WBC 8.6 11.6* 11.2* 13.6*  --  15.8* 16.2*  NEUTROABS 6.2 8.5*  --   --   --   --   --   HGB 9.8* 10.6* 9.9* 10.5* 10.2* 10.9* 11.9*  HCT 29.8* 31.8* 30.7* 32.1* 30.0* 33.8* 38.2*  MCV 98.7 96.7 98.7 99.4  --  99.4 100.5*  PLT 203 236 233 261  --  301 350   Blood Culture    Component Value Date/Time   SDES TRACHEAL ASPIRATE 10/08/2023 0627   SPECREQUEST NONE 10/08/2023 0627   CULT  10/08/2023 0627    Normal respiratory flora-no Staph aureus or  Pseudomonas seen Performed at [facility], [address], [place], [place] [address]    REPTSTATUS 10/10/2023 FINAL 10/08/2023 [id number]   Medications:  ampicillin -sulbactam (UNASYN ) IV Stopped (10/13/23 2211)   feeding supplement (OSMOLITE 1.5 CAL)      amLODipine   10 mg Per Tube Daily   aspirin   300 mg Rectal Daily   Or   aspirin   81 mg Per Tube Daily   carvedilol   6.25 mg Per Tube BID WC   Chlorhexidine  Gluconate Cloth  6 each Topical Q0600   docusate  100 mg Per Tube BID   feeding supplement (PROSource TF20)  60 mL Per Tube Daily   heparin   1,500 Units Intravenous Once   heparin  injection (subcutaneous)  5,000 Units Subcutaneous Q8H   insulin  aspart  0-6 Units Subcutaneous Q4H   isosorbide  dinitrate  5 mg Per Tube BID   mouth  rinse  15 mL Mouth Rinse 4 times per day   pantoprazole  (PROTONIX ) IV  40 mg Intravenous QHS    Dialysis Orders TTS - AF 4:15hr, 500/600, EDW 138kg, 2K/2C bath, AVF, heparin  4000+ - Mircera 50mcg given 10/06/23 - Hectoral 7mcg IV q HD - Takes Auryxia  3/meals and Xphozah 30mg  BID  Assessment/Plan: Acute R MCA CVA: S/p TNK 7/25. Required intubation, now on [place] O2. Ongoing L sided weakness. Per neuro. AHRF, ?aspiration + CVA: Now extubated, on [place] O2. S/p Unasyn  course. ESRD: Continue HD on TTS schedule - next HD tomorrow 8/2. HTN/volume: BP decent, occ high - UF as tolerated, keep lowering EDW. Anemia of ESRD: Hgb > 11, no ESA for now. Secondary HPTH: Ca and Phos very high, VDRA on hold. Home auryxia  can not be given via NG tube - change to fosrenol for now. Nutrition: Alb low, getting TF + protein supplements. Per RD - unable to do Nepro at this point, will change in future.  Izetta Boehringer, PA-C 10/14/2023, 9:25 AM  [employer]

## 2023-10-14 NOTE — Progress Notes (Signed)
 NAME:  Nathan Campbell, MRN:  [id number], DOB:  1975/11/04, LOS: 7 ADMISSION DATE:  10/07/2023, CONSULTATION DATE:  7/25 REFERRING MD: Stroke, CHIEF COMPLAINT: Stroke  History of Present Illness:  48 year old male with past medical history of obesity, hypertension, end-stage renal disease on HD TTS, combined systolic and diastolic heart failure who presented to the emergency department on 7/25 with left-sided weakness, slurred speech.  LKW 0630.  Presented as a code stroke and was greeted by neurology at the bridge. CT head demonstrated acute right MCA infarct, ASPECTS 7.  Additionally he has calcified density along the proximal right M1 segment concerning for calcific embolus. CTA unable to be obtained for restlessness and concern over his airway so he was intubated. Received TNK @ [id number]. Taken to [facility] @ [phone] for angiogram and mechanical intervention.  Pertinent  Medical History  obesity, hypertension, end-stage renal disease on HD TTS, combined systolic and diastolic heart failure  Significant Hospital Events: Including procedures, antibiotic start and stop dates in addition to other pertinent events   7/25: Presented as a code stroke, admit , brought to ICU intubated, PEEP of 10 added 7/26 HD 7/27 tachypneic on pressure support 8/5 >> HD 7/29 extubated 7/30 Overnight had vomiting and aspirated -NG tube placed and put on suction ,X-ray KUB showed ileus , 7/31: O2 req coming down  8/1: having Bms, restarting trickle feeds, walked with PT. Transfer out when stroke ready and will sign out to [facility] then.   Interim History / Subjective:  NAEON. Having bowel movements and passing gas. Will restart TF. Up and walking with PT  Objective   Blood pressure (!) 167/93, pulse 100, temperature 99.4 F (37.4 C), temperature source Oral, resp. rate (!) 22, height 6' (1.829 m), weight 129.3 kg, SpO2 100%.        Intake/Output Summary (Last 24 hours) at 10/14/2023 0850 Last data filed at 10/13/2023  2300 Gross per 24 hour  Intake 298.32 ml  Output 5550 ml  Net -5251.68 ml   Filed Weights   10/13/23 0825 10/13/23 1215 10/14/23 0500  Weight: 132.8 kg 127.2 kg 129.3 kg    Examination: General: middle aged morbidly obese man, sitting oob in chair  HENT: perrla, cortrak, nasal cannula, mm moist  Lungs: nasal cannula, resp even and unlabored, diminished bases  Cardiovascular: S1-S2 regular, no murmur, no rub Abdomen: Obese, soft Extremities: no pitting edema Neuro: awake but tired, expressive aphasia, working with pt to move left side - able to overcome gravity  GU: foley  Resolved Hospital Problem list    Assessment & Plan:  Acute right MCA infarct s/p TNK and NIR mechanical thrombectomy  Left sided weakness and slurred speech. CT w/ acute right MCA infarct s/p TNK and TICI 2b revascularization.  MRI 7/26 shows large right MCA infarct with some mass effect - stroke team primary - Frequent neuro checks - Aspirin  daily - Neuroprotective measures: HOB > 30 degrees, normoglycemia, normothermia - PT/OT/SLP  Acute hypoxic and hypercapneic respiratory failure 2/2 above  Intubated to facilitate CT, concern for airway protection in setting of acute stroke.  Persistent hypoxic may be due to pulm hypertension or due to fluid overload, aspiration possible with bibasilar infiltrates,  Extubated 7/29 then worsened due to aspiration - O2 off this morning with sat 93. Continue to titrate  - Continue empiric Unasyn  for aspiration, 5 days - ends today, respiratory culture negative - IS and pulm toileting  - con't pulse oximetry   Combined systolic and diastolic heart failure Echo  shows enlarged RA/RV, nondiagnostic bubble study  , worsening of pre-existing secondary pulm hypertension Hypertension Home meds amlodipine , carvedilol , isosorbide  mononitrate ,torsemide  60 BID - SBP goal < 160 - carvedilol  6.25 BID, amlodipine  10mg  daily, isordil  5mg  BID - prn hydralazine  and labetalol    ESRD  on HD TTS Anemia of chronic disease Last dialysis 7/31.  - nephrology following - strict I&O - Avoid nephrotoxic agents, renally dose medications  Ileus  NG to LIS  - having BM now and passing flatus  - cortrak - restart trickle feeds today   Okay to move out from ccm perspective. Will sign out to TRH when stroke ready to transfer.   Best Practice (right click and Reselect all SmartList Selections daily)   Diet/type: tubefeeds and NPO  DVT prophylaxis: prophylactic heparin   GI prophylaxis: PPI Lines: NA Foley:  N/A Code Status:  full code Last date of multidisciplinary goals of care discussion [per primary]  Labs   CBC: Recent Labs  Lab 10/07/23 0851 10/07/23 1032 10/08/23 0505 10/10/23 0625 10/11/23 0547 10/12/23 0605 10/12/23 1426 10/13/23 0552 10/14/23 0618  WBC 7.9  --  8.6 11.6* 11.2* 13.6*  --  15.8* 16.2*  NEUTROABS 5.1  --  6.2 8.5*  --   --   --   --   --   HGB 11.2*   < > 9.8* 10.6* 9.9* 10.5* 10.2* 10.9* 11.9*  HCT 34.7*   < > 29.8* 31.8* 30.7* 32.1* 30.0* 33.8* 38.2*  MCV 99.4  --  98.7 96.7 98.7 99.4  --  99.4 100.5*  PLT 229  --  203 236 233 261  --  301 350   < > = values in this interval not displayed.    Basic Metabolic Panel: Recent Labs  Lab 10/09/23 0610 10/10/23 0625 10/11/23 0547 10/12/23 0605 10/12/23 1426 10/13/23 0552 10/14/23 0618  NA 136 132* 136 136 137 142 140  K 4.7 4.0 4.8 4.1 3.9 4.3 4.3  CL 94* 92* 95* 94*  --  95* 91*  CO2 24 24 23 23   --  21* 25  GLUCOSE 97 108* 151* 122*  --  116* 114*  BUN 53* 37* 68* 60*  --  87* 72*  CREATININE 11.53* 7.69* 12.66* 12.03*  --  15.18* 13.26*  CALCIUM  9.6 9.6 9.6 10.0  --  10.5* 10.8*  MG 2.1  --   --   --   --   --   --   PHOS 8.8*  --   --   --   --  11.4*  --    GFR: Estimated Creatinine Clearance: 9.8 mL/min (A) (by C-G formula based on SCr of 13.26 mg/dL (H)). Recent Labs  Lab 10/11/23 0547 10/12/23 0605 10/13/23 0552 10/14/23 0618  WBC 11.2* 13.6* 15.8* 16.2*     Liver Function Tests: Recent Labs  Lab 10/07/23 0851 10/08/23 0505  AST 15 10*  ALT 13 11  ALKPHOS 82 66  BILITOT 0.6 0.8  PROT 7.2 6.1*  ALBUMIN 3.7 3.1*   No results for input(s): LIPASE, AMYLASE in the last 168 hours. No results for input(s): AMMONIA in the last 168 hours.  ABG    Component Value Date/Time   PHART 7.425 10/12/2023 1426   PCO2ART 40.2 10/12/2023 1426   PO2ART 71 (L) 10/12/2023 1426   HCO3 26.4 10/12/2023 1426   TCO2 28 10/12/2023 1426   O2SAT 94 10/12/2023 1426     Coagulation Profile: Recent Labs  Lab 10/07/23 0851  INR  1.0    Cardiac Enzymes: No results for input(s): CKTOTAL, CKMB, CKMBINDEX, TROPONINI in the last 168 hours.  HbA1C: Hgb A1c MFr Bld  Date/Time Value Ref Range Status  10/08/2023 05:05 AM 5.1 4.8 - 5.6 % Final    Comment:    (NOTE) Diagnosis of Diabetes The following HbA1c ranges recommended by the American Diabetes Association (ADA) may be used as an aid in the diagnosis of diabetes mellitus.  Hemoglobin             Suggested A1C NGSP%              Diagnosis  <5.7                   Non Diabetic  5.7-6.4                Pre-Diabetic  >6.4                   Diabetic  <7.0                   Glycemic control for                       adults with diabetes.    06/11/2016 10:57 AM 4.7 (L) 4.8 - 5.6 % Final    Comment:    (NOTE)         Pre-diabetes: 5.7 - 6.4         Diabetes: >6.4         Glycemic control for adults with diabetes: <7.0     CBG: Recent Labs  Lab 10/13/23 1545 10/13/23 1933 10/13/23 2344 10/14/23 0326 10/14/23 0734  GLUCAP 122* 112* 125* 116* 107*    Critical care time: 31 m    Tinnie FORBES Furth, PA-C  Pulmonary & Critical Care 10/14/23 8:50 AM  Please see [email or web address] for pager details.  From 7A-7P if no response, please call [phone] After hours, please call ELink [phone]

## 2023-10-14 NOTE — Plan of Care (Signed)
 Received from 4N in bed accompanied by 2 nurses.  Oriented to room and surroundings.  No complaints.     Problem: Education: Goal: Knowledge of secondary prevention will improve (MUST DOCUMENT ALL) Outcome: Progressing   Problem: Education: Goal: Knowledge of patient specific risk factors will improve (DELETE if not current risk factor) Outcome: Progressing   Problem: Coping: Goal: Will verbalize positive feelings about self Outcome: Progressing   Problem: Self-Care: Goal: Ability to participate in self-care as condition permits will improve Outcome: Progressing   Problem: Nutrition: Goal: Risk of aspiration will decrease Outcome: Progressing   Problem: Nutrition: Goal: Dietary intake will improve Outcome: Progressing

## 2023-10-15 DIAGNOSIS — I779 Disorder of arteries and arterioles, unspecified: Secondary | ICD-10-CM | POA: Diagnosis not present

## 2023-10-15 DIAGNOSIS — R1310 Dysphagia, unspecified: Secondary | ICD-10-CM | POA: Diagnosis not present

## 2023-10-15 DIAGNOSIS — E785 Hyperlipidemia, unspecified: Secondary | ICD-10-CM | POA: Diagnosis not present

## 2023-10-15 DIAGNOSIS — I639 Cerebral infarction, unspecified: Secondary | ICD-10-CM | POA: Diagnosis not present

## 2023-10-15 DIAGNOSIS — I63511 Cerebral infarction due to unspecified occlusion or stenosis of right middle cerebral artery: Secondary | ICD-10-CM | POA: Diagnosis not present

## 2023-10-15 LAB — GLUCOSE, CAPILLARY
Glucose-Capillary: 123 mg/dL — ABNORMAL HIGH (ref 70–99)
Glucose-Capillary: 136 mg/dL — ABNORMAL HIGH (ref 70–99)
Glucose-Capillary: 119 mg/dL — ABNORMAL HIGH (ref 70–99)
Glucose-Capillary: 114 mg/dL — ABNORMAL HIGH (ref 70–99)

## 2023-10-15 LAB — CBC
HCT: 33.9 % — ABNORMAL LOW (ref 39.0–52.0)
Hemoglobin: 10.7 g/dL — ABNORMAL LOW (ref 13.0–17.0)
MCH: 31.9 pg (ref 26.0–34.0)
MCHC: 31.6 g/dL (ref 30.0–36.0)
MCV: 101.2 fL — ABNORMAL HIGH (ref 80.0–100.0)
Platelets: 358 K/uL (ref 150–400)
RBC: 3.35 MIL/uL — ABNORMAL LOW (ref 4.22–5.81)
RDW: 13 % (ref 11.5–15.5)
WBC: 16 K/uL — ABNORMAL HIGH (ref 4.0–10.5)
nRBC: 0.2 % (ref 0.0–0.2)

## 2023-10-15 LAB — BASIC METABOLIC PANEL WITH GFR
Anion gap: 24 — ABNORMAL HIGH (ref 5–15)
BUN: 105 mg/dL — ABNORMAL HIGH (ref 6–20)
CO2: 24 mmol/L (ref 22–32)
Calcium: 10.3 mg/dL (ref 8.9–10.3)
Chloride: 92 mmol/L — ABNORMAL LOW (ref 98–111)
Creatinine, Ser: 16.04 mg/dL — ABNORMAL HIGH (ref 0.61–1.24)
GFR, Estimated: 3 mL/min — ABNORMAL LOW (ref >60)
Glucose, Bld: 123 mg/dL — ABNORMAL HIGH (ref 70–99)
Potassium: 4.5 mmol/L (ref 3.5–5.1)
Sodium: 140 mmol/L (ref 135–145)

## 2023-10-15 MED ORDER — CLOPIDOGREL BISULFATE 75 MG PO TABS: 75.0000 mg | ORAL_TABLET | Freq: Every day | ORAL | Status: DC

## 2023-10-15 MED ORDER — CYCLOBENZAPRINE HCL 10 MG PO TABS
5.0000 mg | ORAL_TABLET | Freq: Three times a day (TID) | ORAL | Status: DC | PRN
  Administered 2023-10-15 – 2023-10-18 (×3): 5 mg
  Filled 2023-10-15 (×3): qty 1

## 2023-10-15 MED ORDER — HEPARIN SODIUM (PORCINE) 1000 UNIT/ML IJ SOLN
INTRAMUSCULAR | Status: AC
  Filled 2023-10-15: qty 2

## 2023-10-15 MED ORDER — CLOPIDOGREL BISULFATE 75 MG PO TABS
75.0000 mg | ORAL_TABLET | Freq: Every day | ORAL | Status: DC
  Administered 2023-10-16 – 2023-10-18 (×3): 75 mg
  Filled 2023-10-15 (×3): qty 1

## 2023-10-15 MED ORDER — HEPARIN SODIUM (PORCINE) 1000 UNIT/ML DIALYSIS
2000.0000 [IU] | Freq: Once | INTRAMUSCULAR | Status: AC
  Administered 2023-10-15: 2000 [IU] via INTRAVENOUS_CENTRAL

## 2023-10-15 MED ORDER — PENTAFLUOROPROP-TETRAFLUOROETH EX AERO: 1.0000 | INHALATION_SPRAY | CUTANEOUS | Status: DC | PRN

## 2023-10-15 MED ORDER — ALTEPLASE 2 MG IJ SOLR: 2.0000 mg | Freq: Once | INTRAMUSCULAR | Status: DC | PRN

## 2023-10-15 MED ORDER — ATORVASTATIN CALCIUM 40 MG PO TABS
40.0000 mg | ORAL_TABLET | Freq: Every day | ORAL | Status: DC
  Administered 2023-10-16 – 2023-10-18 (×3): 40 mg
  Filled 2023-10-15 (×3): qty 1

## 2023-10-15 MED ORDER — LIDOCAINE HCL (PF) 1 % IJ SOLN: 5.0000 mL | INTRAMUSCULAR | Status: DC | PRN

## 2023-10-15 MED ORDER — ACETAMINOPHEN 325 MG PO TABS
ORAL_TABLET | ORAL | Status: AC
  Filled 2023-10-15: qty 1

## 2023-10-15 MED ORDER — HEPARIN SODIUM (PORCINE) 1000 UNIT/ML DIALYSIS: 1000.0000 [IU] | INTRAMUSCULAR | Status: DC | PRN

## 2023-10-15 MED ORDER — LIDOCAINE-PRILOCAINE 2.5-2.5 % EX CREA: 1.0000 | TOPICAL_CREAM | CUTANEOUS | Status: DC | PRN

## 2023-10-15 NOTE — Progress Notes (Signed)
 Received back from dialysis in bed at 1330.  No signs of distress.

## 2023-10-15 NOTE — Plan of Care (Signed)
 Had been calling out and anxious because he had not seen his mom to day because he was in dialysis much of the day.  As soon as his mom got here and ever since she has been here, he has been calm, laughing, and talking with his mom and family.    Problem: Education: Goal: Knowledge of disease or condition will improve Outcome: Progressing   Problem: Education: Goal: Knowledge of secondary prevention will improve (MUST DOCUMENT ALL) Outcome: Progressing   Problem: Education: Goal: Knowledge of patient specific risk factors will improve (DELETE if not current risk factor) Outcome: Progressing   Problem: Coping: Goal: Will verbalize positive feelings about self Outcome: Progressing   Problem: Self-Care: Goal: Ability to participate in self-care as condition permits will improve Outcome: Progressing   Problem: Nutrition: Goal: Dietary intake will improve Outcome: Progressing   Problem: Clinical Measurements: Goal: Respiratory complications will improve Outcome: Progressing   Problem: Clinical Measurements: Goal: Cardiovascular complication will be avoided Outcome: Progressing   Problem: Activity: Goal: Risk for activity intolerance will decrease Outcome: Progressing   Problem: Coping: Goal: Level of anxiety will decrease Outcome: Progressing   Problem: Nutrition: Goal: Adequate nutrition will be maintained Outcome: Progressing

## 2023-10-15 NOTE — Plan of Care (Signed)
  Problem: Education: Goal: Knowledge of disease or condition will improve Outcome: Progressing   Problem: Ischemic Stroke/TIA Tissue Perfusion: Goal: Complications of ischemic stroke/TIA will be minimized Outcome: Progressing   Problem: Coping: Goal: Will verbalize positive feelings about self Outcome: Progressing   Problem: Clinical Measurements: Goal: Respiratory complications will improve Outcome: Progressing

## 2023-10-15 NOTE — Progress Notes (Signed)
 Transported to dialysis accompanied by transporter and this nurse.  No signs of distress.

## 2023-10-15 NOTE — Progress Notes (Signed)
 Received patient in bed.Alert and oriented x 4. Consent verified.  Access used:Left arm avf that worked well.  Duration of treatment: 3.5 hours.  Net uf: 1.2 L ,not tolerating prescribed uf 3 liters.SBP were dropping on his last hour of treatment.  Hemo comment: Hand off to the patients' nurse,back into his room with stable condition via transporter.

## 2023-10-15 NOTE — Progress Notes (Signed)
 [facility] Progress Note   Subjective: Patient states he did not sleep very well but otherwise denies any complaints.  Objective Vitals:   10/15/23 0500 10/15/23 0752 10/15/23 0829 10/15/23 0836  BP:  (!) 151/84 (!) 148/82 (!) 149/89  Pulse:  92  91  Resp:  18 (!) 30 (!) 22  Temp:  (!) 97.5 F (36.4 C)    TempSrc:  Oral    SpO2:  98%    Weight: 134 kg     Height:       Physical Exam General: Lying in bed, no distress, obese Heart: Normal rate, no rub Lungs: Large chest rise with no increased work of breathing Abdomen: soft, distended Extremities: Trace edema in lower extremities, warm and well-perfused Dialysis Access: AVF +t/b  Additional Objective Labs: Basic Metabolic Panel: Recent Labs  Lab 10/09/23 0610 10/10/23 0625 10/13/23 0552 10/14/23 0618 10/15/23 0321  NA 136   < > 142 140 140  K 4.7   < > 4.3 4.3 4.5  CL 94*   < > 95* 91* 92*  CO2 24   < > 21* 25 24  GLUCOSE 97   < > 116* 114* 123*  BUN 53*   < > 87* 72* 105*  CREATININE 11.53*   < > 15.18* 13.26* 16.04*  CALCIUM  9.6   < > 10.5* 10.8* 10.3  PHOS 8.8*  --  11.4*  --   --    < > = values in this interval not displayed.   Liver Function Tests: No results for input(s): AST, ALT, ALKPHOS, BILITOT, PROT, ALBUMIN in the last 168 hours.  CBC: Recent Labs  Lab 10/10/23 0625 10/11/23 0547 10/12/23 0605 10/12/23 1426 10/13/23 0552 10/14/23 0618 10/15/23 0321  WBC 11.6* 11.2* 13.6*  --  15.8* 16.2* 16.0*  NEUTROABS 8.5*  --   --   --   --   --   --   HGB 10.6* 9.9* 10.5*   < > 10.9* 11.9* 10.7*  HCT 31.8* 30.7* 32.1*   < > 33.8* 38.2* 33.9*  MCV 96.7 98.7 99.4  --  99.4 100.5* 101.2*  PLT 236 233 261  --  301 350 358   < > = values in this interval not displayed.   Blood Culture    Component Value Date/Time   SDES TRACHEAL ASPIRATE 10/08/2023 0627   SPECREQUEST NONE 10/08/2023 0627   CULT  10/08/2023 0627    Normal respiratory flora-no Staph aureus or Pseudomonas  seen Performed at [facility], [address], [place], [place] [address]    REPTSTATUS 10/10/2023 FINAL 10/08/2023 9372   Medications:  feeding supplement (OSMOLITE 1.5 CAL) 25 mL/hr at 10/14/23 1400    amLODipine   10 mg Per Tube Daily   aspirin   300 mg Rectal Daily   Or   aspirin   81 mg Per Tube Daily   carvedilol   6.25 mg Per Tube BID WC   Chlorhexidine  Gluconate Cloth  6 each Topical Q0600   docusate  100 mg Per Tube BID   feeding supplement (PROSource TF20)  60 mL Per Tube Daily   heparin   1,500 Units Intravenous Once   heparin   2,000 Units Dialysis Once in dialysis   heparin  injection (subcutaneous)  5,000 Units Subcutaneous Q8H   insulin  aspart  0-6 Units Subcutaneous Q4H   isosorbide  dinitrate  5 mg Per Tube BID   lanthanum   1,000 mg Per Tube TID WC   mouth rinse  15 mL Mouth  Rinse 4 times per day   pantoprazole  (PROTONIX ) IV  40 mg Intravenous QHS    Dialysis Orders TTS - AF 4:15hr, 500/600, EDW 138kg, 2K/2C bath, AVF, heparin  4000+ - Mircera 50mcg given 10/06/23 - Hectoral 7mcg IV q HD - Takes Auryxia  3/meals and Xphozah 30mg  BID  Assessment/Plan: Acute R MCA CVA: S/p TNK 7/25. Required intubation, now on [place] O2. Ongoing L sided weakness. Per neuro. AHRF, ?aspiration + CVA: Now extubated, on [place] O2. S/p Unasyn  course. ESRD: Continue HD on TTS schedule  HTN/volume: BP decent, occ high - UF as tolerated, keep lowering EDW. Anemia of ESRD: Hgb > 11, no ESA for now. Secondary HPTH: Ca and Phos very high, VDRA on hold. Home auryxia  can not be given via NG tube - change to fosrenol  for now. Nutrition: Alb low, getting TF + protein supplements. Per RD - unable to do Nepro at this point, will change in future.   10/15/2023, 8:47 AM  [employer]

## 2023-10-15 NOTE — Progress Notes (Signed)
 PT Cancellation Note  Patient Details Name: Nathan Campbell MRN: [id number] DOB: 1976-01-04   Cancelled Treatment:    Reason Eval/Treat Not Completed: (P) Patient at procedure or test/unavailable, pt off unit at HD. Will check back as schedule allows to continue with PT POC.  Therisa SAUNDERS. PTA Acute Rehabilitation Services Office: [phone]   Therisa CHRISTELLA Boor 10/15/2023, 8:57 AM

## 2023-10-15 NOTE — Progress Notes (Signed)
 PROGRESS NOTE    Nathan Campbell  FMW:[id number] DOB: 01-Feb-1976 DOA: 10/07/2023 PCP: Pcp, No  Outpatient Specialists:     Brief Narrative:  Patient is a 48 year old African-American male with past medical history significant for end-stage renal disease on hemodialysis TTS, combined systolic and diastolic CHF, cardiomyopathy, anemia, COVID infection, dyspnea, hypertension and morbid obesity.  Patient was admitted with acute CVA, with left hemiplegia/paresis and dysarthria.  Patient was admitted on 10/07/2023.  Patient has been under the neurology team and the ICU team.  Neurology team was [facility] assumed primary attending today, 10/15/2023.  10/15/2023: Patient seen in hemodialysis, with 1.2 L ultrafiltration.  Above documentation noted.  Left hemiplegia persists.  Dysarthria persists.   Assessment & Plan:   Principal Problem:   Acute ischemic stroke [facility]) Active Problems:   Stroke (cerebrum) (HCC)   Middle cerebral artery embolism, right   Acute CVA: - Neurology team is directing care. - MRI brain revealed large right MCA territory infarct with associated diffuse cortical thickening and some mass effect. - MRI angio head revealed distal right M2 occlusion. - Echo revealed EF of 55 to 60% and nondiagnostic bubble study. - Patient is currently on aspirin   ESRD on hemodialysis: - Seen on hemodialysis today with 1.2 L ultrafiltration.  Combined systolic/diastolic CHF/cardiomyopathy: - Compensated. - Continue dialysis.  Hypertension: - Controlled.  Anemia: - Hemoglobin of 10.7 g/dL. MCV of 101.2.   DVT prophylaxis:  Code Status: Full code. Family Communication:  Disposition Plan: Inpatient.   Consultants:  Patient has been under neurology and ICU team.  Procedures:    Antimicrobials:      Subjective: No new complaints. No significant history.  Objective: Vitals:   10/15/23 1130 10/15/23 1159 10/15/23 1224 10/15/23 1248  BP: (!) 90/51 (!) 90/49 (!) 100/55    Pulse:  78 100   Resp: 17 (!) 24 (!) 26   Temp:   (!) 97.3 F (36.3 C)   TempSrc:      SpO2:   92%   Weight:    133 kg  Height:        Intake/Output Summary (Last 24 hours) at 10/15/2023 1313 Last data filed at 10/15/2023 1224 Gross per 24 hour  Intake 545 ml  Output 1100 ml  Net -555 ml   Filed Weights   10/14/23 0500 10/15/23 0500 10/15/23 1248  Weight: 129.3 kg 134 kg 133 kg    Examination:  General exam: Appears calm and comfortable.  Patient is obese.  Left-sided hemiplegia noted.  Dysarthria noted. Respiratory system: Clear to auscultation.  Cardiovascular system: S1 & S2 heard.   Gastrointestinal system: Abdomen is soft and nontender.   Central nervous system: Left hemiplegia. Extremities no leg edema.  Data Reviewed: I have personally reviewed following labs and imaging studies  CBC: Recent Labs  Lab 10/10/23 0625 10/11/23 0547 10/12/23 0605 10/12/23 1426 10/13/23 0552 10/14/23 0618 10/15/23 0321  WBC 11.6* 11.2* 13.6*  --  15.8* 16.2* 16.0*  NEUTROABS 8.5*  --   --   --   --   --   --   HGB 10.6* 9.9* 10.5* 10.2* 10.9* 11.9* 10.7*  HCT 31.8* 30.7* 32.1* 30.0* 33.8* 38.2* 33.9*  MCV 96.7 98.7 99.4  --  99.4 100.5* 101.2*  PLT 236 233 261  --  301 350 358   Basic Metabolic Panel: Recent Labs  Lab 10/09/23 0610 10/10/23 0625 10/11/23 0547 10/12/23 0605 10/12/23 1426 10/13/23 0552 10/14/23 0618 10/15/23 0321  NA 136   < > 136  136 137 142 140 140  K 4.7   < > 4.8 4.1 3.9 4.3 4.3 4.5  CL 94*   < > 95* 94*  --  95* 91* 92*  CO2 24   < > 23 23  --  21* 25 24  GLUCOSE 97   < > 151* 122*  --  116* 114* 123*  BUN 53*   < > 68* 60*  --  87* 72* 105*  CREATININE 11.53*   < > 12.66* 12.03*  --  15.18* 13.26* 16.04*  CALCIUM  9.6   < > 9.6 10.0  --  10.5* 10.8* 10.3  MG 2.1  --   --   --   --   --   --   --   PHOS 8.8*  --   --   --   --  11.4*  --   --    < > = values in this interval not displayed.   GFR: Estimated Creatinine Clearance: 8.2 mL/min  (A) (by C-G formula based on SCr of 16.04 mg/dL (H)). Liver Function Tests: No results for input(s): AST, ALT, ALKPHOS, BILITOT, PROT, ALBUMIN in the last 168 hours. No results for input(s): LIPASE, AMYLASE in the last 168 hours. No results for input(s): AMMONIA in the last 168 hours. Coagulation Profile: No results for input(s): INR, PROTIME in the last 168 hours. Cardiac Enzymes: No results for input(s): CKTOTAL, CKMB, CKMBINDEX, TROPONINI in the last 168 hours. BNP (last 3 results) No results for input(s): PROBNP in the last 8760 hours. HbA1C: No results for input(s): HGBA1C in the last 72 hours. CBG: Recent Labs  Lab 10/14/23 1213 10/14/23 1535 10/14/23 2019 10/14/23 2314 10/15/23 0446  GLUCAP 122* 107* 110* 127* 123*   Lipid Profile: No results for input(s): CHOL, HDL, LDLCALC, TRIG, CHOLHDL, LDLDIRECT in the last 72 hours. Thyroid Function Tests: No results for input(s): TSH, T4TOTAL, FREET4, T3FREE, THYROIDAB in the last 72 hours. Anemia Panel: No results for input(s): VITAMINB12, FOLATE, FERRITIN, TIBC, IRON , RETICCTPCT in the last 72 hours. Urine analysis:    Component Value Date/Time   COLORURINE YELLOW 06/14/2016 1939   APPEARANCEUR CLEAR 06/14/2016 1939   LABSPEC 1.011 06/14/2016 1939   PHURINE 6.0 06/14/2016 1939   GLUCOSEU NEGATIVE 06/14/2016 1939   HGBUR NEGATIVE 06/14/2016 1939   BILIRUBINUR NEGATIVE 06/14/2016 1939   KETONESUR NEGATIVE 06/14/2016 1939   PROTEINUR 100 (A) 06/14/2016 1939   NITRITE NEGATIVE 06/14/2016 1939   LEUKOCYTESUR NEGATIVE 06/14/2016 1939   Sepsis Labs: @LABRCNTIP (procalcitonin:4,lacticidven:4)  ) Recent Results (from the past 240 hours)  SARS Coronavirus 2 by RT PCR (hospital order, performed in [facility] hospital lab) *cepheid single result test* Anterior Nasal Swab     Status: None   Collection Time: 10/07/23 10:01 AM   Specimen: Anterior Nasal Swab   Result Value Ref Range Status   SARS Coronavirus 2 by RT PCR NEGATIVE NEGATIVE Final    Comment: Performed at [facility], [address], [place], [place] [address]  MRSA Next Gen by PCR, Nasal     Status: None   Collection Time: 10/07/23  1:29 PM   Specimen: Nasal Mucosa; Nasal Swab  Result Value Ref Range Status   MRSA by PCR Next Gen NOT DETECTED NOT DETECTED Final    Comment: (NOTE) The GeneXpert MRSA Assay (FDA approved for NASAL specimens only), is one component of a comprehensive MRSA colonization surveillance program. It is not intended to diagnose MRSA infection nor to guide or  monitor treatment for MRSA infections. Test performance is not FDA approved in patients less than 49 years old. Performed at [facility], [address], [place], [place] [address]   Culture, Respiratory w Gram Stain     Status: None   Collection Time: 10/08/23  6:27 AM   Specimen: Tracheal Aspirate; Respiratory  Result Value Ref Range Status   Specimen Description TRACHEAL ASPIRATE  Final   Special Requests NONE  Final   Gram Stain   Final    FEW WBC PRESENT, PREDOMINANTLY PMN RARE GRAM POSITIVE COCCI    Culture   Final    Normal respiratory flora-no Staph aureus or Pseudomonas seen Performed at [facility], [address] [place], [place] [address]    Report Status 10/10/2023 FINAL  Final         Radiology Studies: DG Abd Portable 1V Result Date: 10/14/2023 CLINICAL DATA:  [id number] Encounter for feeding tube placement [id number] EXAM: PORTABLE ABDOMEN - 1 VIEW COMPARISON:  10/12/2023 abdomen radiograph FINDINGS: Enteric tube courses into the distal stomach with weighted tip in the medial right abdomen likely in the region of the pylorus. No evidence of pneumoperitoneum. IMPRESSION: Enteric tube courses into the distal stomach with weighted tip in the medial right abdomen likely in the region of the pylorus. Electronically Signed   By: Selinda DELENA Blue M.D.   On: 10/14/2023 10:04         Scheduled Meds:  amLODipine   10 mg Per Tube Daily   aspirin   300 mg Rectal Daily   Or   aspirin   81 mg Per Tube Daily   carvedilol   6.25 mg Per Tube BID WC   Chlorhexidine  Gluconate Cloth  6 each Topical Q0600   docusate  100 mg Per Tube BID   feeding supplement (PROSource TF20)  60 mL Per Tube Daily   heparin   1,500 Units Intravenous Once   heparin  injection (subcutaneous)  5,000 Units Subcutaneous Q8H   insulin  aspart  0-6 Units Subcutaneous Q4H   isosorbide  dinitrate  5 mg Per Tube BID   lanthanum   1,000 mg Per Tube TID WC   mouth rinse  15 mL Mouth Rinse 4 times per day   pantoprazole  (PROTONIX ) IV  40 mg Intravenous QHS   Continuous Infusions:  feeding supplement (OSMOLITE 1.5 CAL) 25 mL/hr at 10/14/23 1400     LOS: 8 days    Time spent: 55 minutes.    Leatrice Chapel, MD  [facility] Pager #: [phone] 7PM-7AM contact night coverage as above

## 2023-10-15 NOTE — Progress Notes (Signed)
 SLP Cancellation Note  Patient Details Name: Nathan Campbell MRN: [id number] DOB: 05-09-1975   Cancelled treatment:        Pt in HD this am.  Attempted to see pt for swallowing assessment following return to floor.  RN reports AMS, pt calling out/moaning/falling asleep. SLP will follow up early next weed for readiness for PO trials. If pt has any ST needs over weekend. Please message [facility] SLP group via secure chat, or call Acute Rehab department at [phone].     Anette FORBES Grippe, MA, CCC-SLP Acute Rehabilitation Services Office: [phone] 10/15/2023, 2:36 PM

## 2023-10-15 NOTE — Progress Notes (Signed)
 STROKE TEAM PROGRESS NOTE  Hospital events 7/25: Patient admitted with right MCA territory infarct, given TNK and taken to interventional radiology for mechanical thrombectomy 7/29: Patient extubated 7/29: Ileus, tachypnea, now on heated high flow 8/1: Patient transferred out of ICU  INTERIM HISTORY/SUBJECTIVE No family at the bedside. Pt was seen at HD unit. Drowsy but arousable and following commands, still has left UE plegia and LE paresis. Pending CIR  OBJECTIVE  CBC    Component Value Date/Time   WBC 16.0 (H) 10/15/2023 0321   RBC 3.35 (L) 10/15/2023 0321   HGB 10.7 (L) 10/15/2023 0321   HGB 9.2 (L) 01/22/2021 1235   HCT 33.9 (L) 10/15/2023 0321   HCT 28.9 (L) 01/22/2021 1235   PLT 358 10/15/2023 0321   PLT 208 01/22/2021 1235   MCV 101.2 (H) 10/15/2023 0321   MCV 93 01/22/2021 1235   MCH 31.9 10/15/2023 0321   MCHC 31.6 10/15/2023 0321   RDW 13.0 10/15/2023 0321   RDW 12.0 01/22/2021 1235   LYMPHSABS 1.1 10/10/2023 0625   LYMPHSABS 0.8 01/22/2021 1235   MONOABS 1.6 (H) 10/10/2023 0625   EOSABS 0.3 10/10/2023 0625   EOSABS 0.1 01/22/2021 1235   BASOSABS 0.0 10/10/2023 0625   BASOSABS 0.0 01/22/2021 1235    BMET    Component Value Date/Time   NA 140 10/15/2023 0321   K 4.5 10/15/2023 0321   CL 92 (L) 10/15/2023 0321   CO2 24 10/15/2023 0321   GLUCOSE 123 (H) 10/15/2023 0321   BUN 105 (H) 10/15/2023 0321   CREATININE 16.04 (H) 10/15/2023 0321   CALCIUM  10.3 10/15/2023 0321   GFRNONAA 3 (L) 10/15/2023 0321    IMAGING past 24 hours No results found.     Vitals:   10/15/23 1130 10/15/23 1159 10/15/23 1224 10/15/23 1248  BP: (!) 90/51 (!) 90/49 (!) 100/55   Pulse:  78 100   Resp: 17 (!) 24 (!) 26   Temp:   (!) 97.3 F (36.3 C)   TempSrc:      SpO2:   92%   Weight:    133 kg  Height:         PHYSICAL EXAM General: Obese middle-aged African-American male.   CV: Regular rate and rhythm on monitor Respiratory: Respirations regular and unlabored on  room air GI: Abdomen soft and nontender   NEURO: drowsy but arousable, with eyes open, pt orientated to age, place, time. No aphasia but paucity of speech, following all simple commands, mild dysarthria. Able to name and repeat in dysarthric voice. No gaze palsy, tracking bilaterally, visual field testing not quite cooperative, keep turning eyes on the left to count fingers, PERRL. Left mild facial droop. Tongue midline. RUE and RLE 5/5. LUE 0/5 and LLE proximal 3-/5 and distal DF 4/5. Sensation symmetrical bilaterally subjectively, R FTN intact, gait not tested.     ASSESSMENT/PLAN  [name] is a 48 y.o. male with history of obesity, hypertension, end-stage renal disease on HD TTS, combined systolic and diastolic heart failure who presented to the emergency department on 7/25 with left-sided weakness, slurred speech.  NIH on Admission 13. IV TNK after CT head showed what looked like a calcific embolus in the right MCA and a good ASPECT  score and CT angiogram could not be obtained with emergent cerebral catheter angiogram showed right M1 occlusion and he underwent  TICI 2 b revascularization of the superior and inferior division to the M2 region but was left intubated for medical issues  given history of CHF and renal failure.  He has since been extubated and is ready to be transferred out of the ICU.  Stroke:  right MCA large infarct with right M1 occlusion s/p TNK and IR with TICI2b, etiology: Likely large vessel disease given calcified plaque Code Stroke CT head - Findings consistent with acute right MCA territory ischemic changes, ASPECT score 7. And calcific density is noted in the proximal right M1 segment. This is most concerning for a calcific embolus. Intracranial atherosclerotic changes considered less likely. S/p IR with right M1 occlusion achieved TICI2b reperfusion MRI   Large right MCA territory infarct with associated diffuse cortical thickening and some mass effect MRA   Distal right M2 occlusion, somewhat more proximal than on the final angio images. No new occlusion is present. Repeat CT Head-  Expected evolution of the large right MCA territory infarct with effacement of the sulci and hyperdense thrombus within sylvian branches of the right MCA. No significant midline shift. 2D Echo EF 50-55% LDL 85 HgbA1c 5.1 UDS negative VTE prophylaxis - heparin  subcu No antithrombotic prior to admission, now on aspirin  81 and Plavix  75 DAPT for 3 months and then as alone. Therapy recommendations: CIR Disposition: Pending  Acute hypoxic and hypercapnic respiratory failure Intubated for procedure Postprocedure extubated to high flow nasal cannula Now tolerating well, transferred out of ICU  Combined systolic and diastolic heart failure Hypertension Home meds: Amlodipine , isosorbide  mononitrate, furosemide , carvedilol  Now on Norvasc , Coreg  and isosorbide  BP stable Long-term BP goal normotensive  Hyperlipidemia Home meds none LDL 85 goal < 70 Elevated TG [phone] Add atorvastatin  40 Continue statin at discharge  Dysphagia Did not pass swallow NPO On tube feeding @25  cc  ESRD on HD Nephrology consulted Creatinine 13.26 HD Tuesday Thursday Saturday  Other stroke risk factors Obesity, BMI 39.77  Other acute medical issues Leukocytosis WBC 13.6--15.8--16.2 Back pain - Tylenol  and Flexeril  as needed   Hospital day # 8   Nathan Cummins, MD PhD Stroke Neurology 10/15/2023 10:07 PM

## 2023-10-16 DIAGNOSIS — I63511 Cerebral infarction due to unspecified occlusion or stenosis of right middle cerebral artery: Secondary | ICD-10-CM | POA: Diagnosis not present

## 2023-10-16 DIAGNOSIS — R1310 Dysphagia, unspecified: Secondary | ICD-10-CM | POA: Diagnosis not present

## 2023-10-16 DIAGNOSIS — I779 Disorder of arteries and arterioles, unspecified: Secondary | ICD-10-CM | POA: Diagnosis not present

## 2023-10-16 DIAGNOSIS — E785 Hyperlipidemia, unspecified: Secondary | ICD-10-CM | POA: Diagnosis not present

## 2023-10-16 DIAGNOSIS — I639 Cerebral infarction, unspecified: Secondary | ICD-10-CM | POA: Diagnosis not present

## 2023-10-16 LAB — GLUCOSE, CAPILLARY
Glucose-Capillary: 101 mg/dL — ABNORMAL HIGH (ref 70–99)
Glucose-Capillary: 110 mg/dL — ABNORMAL HIGH (ref 70–99)
Glucose-Capillary: 112 mg/dL — ABNORMAL HIGH (ref 70–99)
Glucose-Capillary: 122 mg/dL — ABNORMAL HIGH (ref 70–99)
Glucose-Capillary: 125 mg/dL — ABNORMAL HIGH (ref 70–99)
Glucose-Capillary: 135 mg/dL — ABNORMAL HIGH (ref 70–99)
Glucose-Capillary: 99 mg/dL (ref 70–99)

## 2023-10-16 MED ORDER — OSMOLITE 1.5 CAL PO LIQD
1000.0000 mL | ORAL | Status: DC
  Administered 2023-10-17: 1000 mL

## 2023-10-16 MED ORDER — FREE WATER
30.0000 mL | Freq: Four times a day (QID) | Status: DC
  Administered 2023-10-16 – 2023-10-18 (×6): 30 mL

## 2023-10-16 NOTE — Progress Notes (Signed)
 [facility] Progress Note   Subjective: Patient does not communicate any problems today.  Gives thumbs up.  Objective Vitals:   10/16/23 0044 10/16/23 0423 10/16/23 0448 10/16/23 0729  BP: (!) 144/86  113/79 (!) 131/96  Pulse: 98  100 98  Resp: 13  12 18   Temp: 99.4 F (37.4 C)  98.3 F (36.8 C) 98.5 F (36.9 C)  TempSrc: Oral  Oral Oral  SpO2: 94%  100% 95%  Weight:  130.1 kg    Height:       Physical Exam General: Lying in bed, no distress, obese Heart: Normal rate, no rub Lungs: Bilateral chest rise with no increased work of breathing Abdomen: soft, distended Extremities: Trace edema in lower extremities, warm and well-perfused Dialysis Access: AVF +t/b  Additional Objective Labs: Basic Metabolic Panel: Recent Labs  Lab 10/13/23 0552 10/14/23 0618 10/15/23 0321  NA 142 140 140  K 4.3 4.3 4.5  CL 95* 91* 92*  CO2 21* 25 24  GLUCOSE 116* 114* 123*  BUN 87* 72* 105*  CREATININE 15.18* 13.26* 16.04*  CALCIUM  10.5* 10.8* 10.3  PHOS 11.4*  --   --    Liver Function Tests: No results for input(s): AST, ALT, ALKPHOS, BILITOT, PROT, ALBUMIN in the last 168 hours.  CBC: Recent Labs  Lab 10/10/23 0625 10/11/23 0547 10/12/23 0605 10/12/23 1426 10/13/23 0552 10/14/23 0618 10/15/23 0321  WBC 11.6* 11.2* 13.6*  --  15.8* 16.2* 16.0*  NEUTROABS 8.5*  --   --   --   --   --   --   HGB 10.6* 9.9* 10.5*   < > 10.9* 11.9* 10.7*  HCT 31.8* 30.7* 32.1*   < > 33.8* 38.2* 33.9*  MCV 96.7 98.7 99.4  --  99.4 100.5* 101.2*  PLT 236 233 261  --  301 350 358   < > = values in this interval not displayed.   Blood Culture    Component Value Date/Time   SDES TRACHEAL ASPIRATE 10/08/2023 0627   SPECREQUEST NONE 10/08/2023 0627   CULT  10/08/2023 0627    Normal respiratory flora-no Staph aureus or Pseudomonas seen Performed at [facility], [address], [place], [place] [address]    REPTSTATUS 10/10/2023 FINAL 10/08/2023 9372    Medications:  feeding supplement (OSMOLITE 1.5 CAL) 25 mL/hr at 10/14/23 1400    amLODipine   10 mg Per Tube Daily   aspirin   81 mg Per Tube Daily   atorvastatin   40 mg Per Tube Daily   carvedilol   6.25 mg Per Tube BID WC   Chlorhexidine  Gluconate Cloth  6 each Topical Q0600   clopidogrel   75 mg Per Tube Daily   docusate  100 mg Per Tube BID   feeding supplement (PROSource TF20)  60 mL Per Tube Daily   heparin   1,500 Units Intravenous Once   heparin  injection (subcutaneous)  5,000 Units Subcutaneous Q8H   insulin  aspart  0-6 Units Subcutaneous Q4H   isosorbide  dinitrate  5 mg Per Tube BID   lanthanum   1,000 mg Per Tube TID WC   mouth rinse  15 mL Mouth Rinse 4 times per day   pantoprazole  (PROTONIX ) IV  40 mg Intravenous QHS    Dialysis Orders TTS - AF 4:15hr, 500/600, EDW 138kg, 2K/2C bath, AVF, heparin  4000+ - Mircera 50mcg given 10/06/23 - Hectoral 7mcg IV q HD - Takes Auryxia  3/meals and Xphozah 30mg  BID  Assessment/Plan: Acute R MCA CVA: S/p TNK 7/25. Required  intubation, now on [place] O2. Ongoing L sided weakness. Per neuro. AHRF, ?aspiration + CVA: Now extubated, on [place] O2. S/p Unasyn  course. ESRD: Continue HD on TTS schedule  HTN/volume: BP decent, occ high - UF as tolerated, keep lowering EDW. Anemia of ESRD: Hgb ~11, no ESA for now. Secondary HPTH: Ca and Phos very high, VDRA on hold. Home auryxia  can not be given via NG tube - change to fosrenol  for now. Nutrition: Alb low, getting TF + protein supplements. Per RD - unable to do Nepro at this point, will change in future.   10/16/2023, 7:32 AM  [employer]

## 2023-10-16 NOTE — Plan of Care (Signed)
 Has had several visitors today.  In good spirits.  Is happy because ST worked with him today and said he will have a MBS tomorrow and could possibly be able to start eating something tomorrow.  He is looking forward to that.  He has been practicing and trying to slow down help to make his words clearer.    Problem: Education: Goal: Knowledge of secondary prevention will improve (MUST DOCUMENT ALL) Outcome: Progressing   Problem: Ischemic Stroke/TIA Tissue Perfusion: Goal: Complications of ischemic stroke/TIA will be minimized Outcome: Progressing   Problem: Coping: Goal: Will verbalize positive feelings about self Outcome: Progressing   Problem: Nutrition: Goal: Risk of aspiration will decrease Outcome: Progressing   Problem: Nutrition: Goal: Dietary intake will improve Outcome: Progressing   Problem: Education: Goal: Knowledge of General Education information will improve Description: Including pain rating scale, medication(s)/side effects and non-pharmacologic comfort measures Outcome: Progressing   Problem: Activity: Goal: Risk for activity intolerance will decrease Outcome: Progressing   Problem: Nutrition: Goal: Adequate nutrition will be maintained Outcome: Progressing

## 2023-10-16 NOTE — Evaluation (Signed)
 Clinical/Bedside Swallow Evaluation Patient Details  Name: Nathan Campbell MRN: [id number] Date of Birth: May 30, 1975  Today's Date: 10/16/2023 Time: SLP Start Time (ACUTE ONLY): 1349 SLP Stop Time (ACUTE ONLY): 1405 SLP Time Calculation (min) (ACUTE ONLY): 16 min  Past Medical History:  Past Medical History:  Diagnosis Date   Acute combined systolic and diastolic CHF, NYHA class 4 (HCC) 06/10/2016   Anemia    Cardiomyopathy (HCC) 06/14/2016   CHF (congestive heart failure) (HCC)    COVID 10/2020   Dyspnea    ESRD on hemodialysis (HCC)    TTS at [facility]   Hypertension    Hypertensive heart and kidney disease with heart failure (HCC) 06/14/2016   Hypertensive heart disease with congestive heart failure (HCC)    Obesity    Past Surgical History:  Past Surgical History:  Procedure Laterality Date   A/V FISTULAGRAM Left 05/03/2022   Procedure: A/V Fistulagram;  Surgeon: Eliza Lonni RAMAN, MD;  Location: [facility] INVASIVE CV LAB;  Service: Cardiovascular;  Laterality: Left;   AV FISTULA PLACEMENT Left 11/11/2020   Procedure: LEFT ARM First Stage Basilic Vein Fistula Creation;  Surgeon: Sheree Penne Lonni, MD;  Location: [facility] OR;  Service: Vascular;  Laterality: Left;   BASCILIC VEIN TRANSPOSITION Left 01/23/2021   Procedure: Second Stage Basilic Vein Transposition of Left Arm Arteriovenous  Fistula;  Surgeon: Sheree Penne Lonni, MD;  Location: [facility] OR;  Service: Vascular;  Laterality: Left;  Block  to LMA    COLONOSCOPY     FISTULA SUPERFICIALIZATION Left 05/07/2022   Procedure: PLICATION OF ANEURYSM, LEFT ARM FISTULA;  Surgeon: Eliza Lonni RAMAN, MD;  Location: [facility] OR;  Service: Vascular;  Laterality: Left;   IR CT HEAD LTD  10/07/2023   IR CT HEAD LTD  10/07/2023   IR PATIENT EVAL TECH 0-60 MINS  10/07/2023   IR PERCUTANEOUS ART THROMBECTOMY/INFUSION INTRACRANIAL INC DIAG ANGIO  10/07/2023   NO PAST SURGERIES     PERIPHERAL VASCULAR BALLOON ANGIOPLASTY Left 05/03/2022    Procedure: PERIPHERAL VASCULAR BALLOON ANGIOPLASTY;  Surgeon: Eliza Lonni RAMAN, MD;  Location: [facility] INVASIVE CV LAB;  Service: Cardiovascular;  Laterality: Left;  AVF   RADIOLOGY WITH ANESTHESIA N/A 10/07/2023   Procedure: RADIOLOGY WITH ANESTHESIA;  Surgeon: Radiologist, Medication, MD;  Location: [facility] OR;  Service: Radiology;  Laterality: N/A;   HPI:  Pt is a 48 y/o male who presented 10/07/23 with left-sided weakness and slurred speech. MRI brain 7/26: Large right MCA territory infarct with associated diffuse cortical thickening and some mass effect. Pt s/p TNK & IR revascularization. Intubated 7/25-7/29. 7/29 ileus. PMH: CHF, CM, ESRD on HD TTS, HTN, obesity, HLD    Assessment / Plan / Recommendation  Clinical Impression  Pt was seen for bedside swallow evaluation and he denied a history of dysphagia. Oral mechanism exam was significant for reduced labial and lingual strength and reduced ROM; dentition was adequate. He presented with symptoms of oropharyngeal dysphagia characterized by signs of aspiration with thin liquids, reduced labial stripping, anterior spillage with liquids, and oral residue. A modified barium swallow study is recommended to further evaluate swallow function, and prognosis for diet advancement is judged to be good at this time. Pt may have ice chips after oral care until the MBS is completed. SLP Visit Diagnosis: Dysphagia, oropharyngeal phase (R13.12)    Aspiration Risk  Moderate aspiration risk;Mild aspiration risk    Diet Recommendation Ice chips PRN after oral care    Medication Administration: Via alternative means Postural Changes: Seated upright  at 90 degrees    Other  Recommendations Oral Care Recommendations: Oral care prior to ice chip/H20     Assistance Recommended at Discharge    Functional Status Assessment Patient has had a recent decline in their functional status and demonstrates the ability to make significant improvements in function in a  reasonable and predictable amount of time.  Frequency and Duration min 2x/week  2 weeks       Prognosis Prognosis for improved oropharyngeal function: Good Barriers to Reach Goals: Cognitive deficits      Swallow Study   General Date of Onset: 10/07/23 HPI: Pt is a 48 y/o male who presented 10/07/23 with left-sided weakness and slurred speech. MRI brain 7/26: Large right MCA territory infarct with associated diffuse cortical thickening and some mass effect. Pt s/p TNK & IR revascularization. Intubated 7/25-7/29. 7/29 ileus. PMH: CHF, CM, ESRD on HD TTS, HTN, obesity, HLD Type of Study: Bedside Swallow Evaluation Previous Swallow Assessment: none Diet Prior to this Study: NPO;Cortrak/Small bore NG tube Temperature Spikes Noted: No Respiratory Status: Room air History of Recent Intubation: No Behavior/Cognition: Alert;Pleasant mood;Cooperative Oral Care Completed by SLP: No Oral Cavity - Dentition: Adequate natural dentition Vision: Functional for self-feeding Self-Feeding Abilities: Needs assist Patient Positioning: Upright in bed;Postural control adequate for testing Baseline Vocal Quality: Low vocal intensity;Breathy Volitional Cough: Strong Volitional Swallow: Able to elicit    Oral/Motor/Sensory Function Overall Oral Motor/Sensory Function: Moderate impairment Facial ROM: Reduced left Facial Symmetry: Abnormal symmetry left Facial Strength: Reduced left Lingual ROM: Reduced left;Suspected CN XII (hypoglossal) dysfunction Lingual Symmetry: Abnormal symmetry left;Suspected CN XII (hypoglossal) dysfunction Lingual Strength: Reduced;Suspected CN XII (hypoglossal) dysfunction   Ice Chips Ice chips: Impaired Presentation: Spoon Oral Phase Impairments: Reduced labial seal Oral Phase Functional Implications: Left anterior spillage   Thin Liquid Thin Liquid: Impaired Presentation: Cup Oral Phase Impairments: Reduced labial seal Oral Phase Functional Implications: Left anterior  spillage Pharyngeal  Phase Impairments: Cough - Immediate    Nectar Thick Nectar Thick Liquid: Not tested   Honey Thick Honey Thick Liquid: Not tested   Puree Puree: Impaired Presentation: Spoon Oral Phase Functional Implications: Oral residue;Prolonged oral transit   Solid     Solid: Not tested     Nathan Cumberland I. [place], MS, CCC-SLP Acute Rehabilitation Services Office number [phone]  Thea LILLETTE Campbell 10/16/2023,3:49 PM

## 2023-10-16 NOTE — Progress Notes (Signed)
 STROKE TEAM PROGRESS NOTE  Hospital events 7/25: Patient admitted with right MCA territory infarct, given TNK and taken to interventional radiology for mechanical thrombectomy 7/29: Patient extubated 7/29: Ileus, tachypnea, now on heated high flow 8/1: Patient transferred out of ICU  INTERIM HISTORY/SUBJECTIVE Daughter and father are at the bedside. Pt lying in bed, still has RUE plegia and RLE paresis. Back pain and left arm pain getting better. Pending CIR  OBJECTIVE  CBC    Component Value Date/Time   WBC 16.0 (H) 10/15/2023 0321   RBC 3.35 (L) 10/15/2023 0321   HGB 10.7 (L) 10/15/2023 0321   HGB 9.2 (L) 01/22/2021 1235   HCT 33.9 (L) 10/15/2023 0321   HCT 28.9 (L) 01/22/2021 1235   PLT 358 10/15/2023 0321   PLT 208 01/22/2021 1235   MCV 101.2 (H) 10/15/2023 0321   MCV 93 01/22/2021 1235   MCH 31.9 10/15/2023 0321   MCHC 31.6 10/15/2023 0321   RDW 13.0 10/15/2023 0321   RDW 12.0 01/22/2021 1235   LYMPHSABS 1.1 10/10/2023 0625   LYMPHSABS 0.8 01/22/2021 1235   MONOABS 1.6 (H) 10/10/2023 0625   EOSABS 0.3 10/10/2023 0625   EOSABS 0.1 01/22/2021 1235   BASOSABS 0.0 10/10/2023 0625   BASOSABS 0.0 01/22/2021 1235    BMET    Component Value Date/Time   NA 140 10/15/2023 0321   K 4.5 10/15/2023 0321   CL 92 (L) 10/15/2023 0321   CO2 24 10/15/2023 0321   GLUCOSE 123 (H) 10/15/2023 0321   BUN 105 (H) 10/15/2023 0321   CREATININE 16.04 (H) 10/15/2023 0321   CALCIUM  10.3 10/15/2023 0321   GFRNONAA 3 (L) 10/15/2023 0321    IMAGING past 24 hours No results found.     Vitals:   10/16/23 0423 10/16/23 0448 10/16/23 0729 10/16/23 1151  BP:  113/79 (!) 131/96 (!) 144/91  Pulse:  100 98 95  Resp:  12 18 18   Temp:  98.3 F (36.8 C) 98.5 F (36.9 C) 98.3 F (36.8 C)  TempSrc:  Oral Oral Oral  SpO2:  100% 95% 96%  Weight: 130.1 kg     Height:         PHYSICAL EXAM General: Obese middle-aged African-American male.   CV: Regular rate and rhythm on  monitor Respiratory: Respirations regular and unlabored on room air GI: Abdomen soft and nontender   NEURO: drowsy but arousable, with eyes open, pt orientated to age, place, time. No aphasia but paucity of speech, following all simple commands, mild dysarthria. Able to name and repeat in dysarthric voice. No gaze palsy, tracking bilaterally, visual field testing not quite cooperative, keep turning eyes on the left to count fingers, PERRL. Left mild facial droop. Tongue midline. RUE and RLE 5/5. LUE 0/5 and LLE proximal 3-/5 and distal DF 4/5. Sensation symmetrical bilaterally subjectively, R FTN intact, gait not tested.     ASSESSMENT/PLAN  Nathan Campbell is a 48 y.o. male with history of obesity, hypertension, end-stage renal disease on HD TTS, combined systolic and diastolic heart failure who presented to the emergency department on 7/25 with left-sided weakness, slurred speech.  NIH on Admission 13. IV TNK after CT head showed what looked like a calcific embolus in the right MCA and a good ASPECT  score and CT angiogram could not be obtained with emergent cerebral catheter angiogram showed right M1 occlusion and he underwent  TICI 2 b revascularization of the superior and inferior division to the M2 region but was left intubated  for medical issues given history of CHF and renal failure.  He has since been extubated and is ready to be transferred out of the ICU.  Stroke:  right MCA large infarct with right M1 occlusion s/p TNK and IR with TICI2b, etiology: Likely large vessel disease given calcified plaque Code Stroke CT head - Findings consistent with acute right MCA territory ischemic changes, ASPECT score 7. And calcific density is noted in the proximal right M1 segment. This is most concerning for a calcific embolus. Intracranial atherosclerotic changes considered less likely. S/p IR with right M1 occlusion achieved TICI2b reperfusion MRI   Large right MCA territory infarct with associated  diffuse cortical thickening and some mass effect MRA  Distal right M2 occlusion, somewhat more proximal than on the final angio images. No new occlusion is present. Repeat CT Head-  Expected evolution of the large right MCA territory infarct with effacement of the sulci and hyperdense thrombus within sylvian branches of the right MCA. No significant midline shift. 2D Echo EF 50-55% LDL 85 HgbA1c 5.1 UDS negative VTE prophylaxis - heparin  subcu No antithrombotic prior to admission, now on aspirin  81 and Plavix  75 DAPT for 3 months and then ASA alone. Therapy recommendations: CIR Disposition: Pending  Acute hypoxic and hypercapnic respiratory failure Intubated for procedure Postprocedure extubated to high flow nasal cannula Now tolerating well, transferred out of ICU  Combined systolic and diastolic heart failure Hypertension Home meds: Amlodipine , isosorbide  mononitrate, furosemide , carvedilol  Now on Norvasc , Coreg  and isosorbide  BP stable Long-term BP goal normotensive  Hyperlipidemia Home meds none LDL 85 goal < 70 Elevated TG [phone] Add atorvastatin  40 Continue statin at discharge  Dysphagia Now still NPO able to have ice chip with full supervision Pending MBS in am On tube feeding with water  flush  ESRD on HD Nephrology consulted HD Tuesday Thursday Saturday  Other stroke risk factors Obesity, BMI 39.77  Other acute medical issues Leukocytosis WBC 13.6--15.8--16.2--16.0 Back pain - Tylenol  and Flexeril  as needed   Hospital day # 9   Ary Cummins, MD PhD Stroke Neurology 10/16/2023 3:30 PM

## 2023-10-16 NOTE — Progress Notes (Signed)
 Speech Language Pathology Treatment: Cognitive-Linguistic; Dysarthria  Patient Details Name: Nathan Campbell MRN: [id number] DOB: [birth year] Today's Date: 10/16/2023 Time: 8594-8561 SLP Time Calculation (min) (ACUTE ONLY): 33 min  Assessment / Plan / Recommendation Clinical Impression  Pt was able to maintain adequate alertness for further evaluation of his cognitive-linguistic skills with the [employer] Mental Status Examination. He achieved a score of 8/30 which is below the normal limits of 27 or more out of 30. He exhibited impairments in the areas of intellectual awareness, attention, memory, executive function, and complex problem solving. He also presented with severe dysarthria characterized by reduced articulatory precision, and reduced vocal intensity which negatively impacted speech intelligibility across all levels of communication. He was educated regarding the nature of dysarthria, and compensatory strategies to improve speech intelligibility. Pt verbalized understanding regarding all areas of education. He used compensatory strategies at the word level with 60% accuracy increasing to 80% accuracy with models for overarticulation. SLP will continue to follow pt.    Cognition  Overall Cognitive Status: Impaired/Different from baseline Arousal/Alertness: Awake/alert Orientation Level: Oriented to person;Oriented to place;Disoriented to time Year: 2025 Month: August Day of Week: Incorrect Attention: Focused;Sustained Focused Attention: Appears intact Sustained Attention: Impaired Sustained Attention Impairment: Verbal complex Memory: Impaired Memory Impairment: Retrieval deficit;Storage deficit;Decreased recall of new information (Immediate: 4/5 with repetition x3; delayed: 1/5; 4/5 with cues) Awareness: Impaired Awareness Impairment: Intellectual impairment Problem Solving: Impaired Problem Solving Impairment: Verbal complex Executive Function:  Sequencing;Organizing;Reasoning Reasoning: Impaired Reasoning Impairment: Verbal complex Sequencing: Impaired Sequencing Impairment: Verbal complex (clock drawing: 0/4) Organizing: Impaired Organizing Impairment: Verbal complex       Comprehension  Auditory Comprehension Interfering Components: Attention;Working Physiological scientist Expression Initiation: No impairment Level of Generative/Spontaneous Verbalization: Conversation Repetition: No impairment Interfering Components: Speech intelligibility   Oral / Motor  Oral Motor/Sensory Function Overall Oral Motor/Sensory Function: Moderate impairment Facial ROM: Reduced left Facial Symmetry: Abnormal symmetry left Facial Strength: Reduced left Lingual ROM: Reduced left;Suspected CN XII (hypoglossal) dysfunction Lingual Symmetry: Abnormal symmetry left;Suspected CN XII (hypoglossal) dysfunction Lingual Strength: Reduced;Suspected CN XII (hypoglossal) dysfunction Motor Speech Overall Motor Speech: Impaired at baseline Respiration: Impaired Level of Impairment: Sentence Phonation: Low vocal intensity;Breathy Articulation: Impaired Level of Impairment: Sentence Intelligibility: Intelligibility reduced Word: 25-49% accurate Phrase: 25-49% accurate Sentence: 25-49% accurate Motor Speech Errors: Aware;Consistent      HPI HPI: Pt is a 48 y/o male who presented 10/07/23 with left-sided weakness and slurred speech. MRI brain 7/26: Large right MCA territory infarct with associated diffuse cortical thickening and some mass effect. Pt s/p TNK & IR revascularization. Intubated 7/25-7/29. 7/29 ileus. PMH: CHF, CM, ESRD on HD TTS, HTN, obesity, HLD      SLP Plan  Continue with current plan of care  Patient needs continued Speech Language Pathology Services       Recommendations  Diet recommendations: NPO (except ice chips) Medication Administration: Via alternative means                  Oral care  prior to ice chip/H20   Intermittent Supervision/Assistance Dysarthria and anarthria (R47.1);Cognitive communication deficit (R41.841)     Continue with current plan of care    Ceri Mayer I. [place], MS, CCC-SLP Acute Rehabilitation Services Office number [phone]  Thea LILLETTE [place]  10/16/2023, 4:07 PM

## 2023-10-16 NOTE — Progress Notes (Signed)
 PROGRESS NOTE    Nathan Campbell  FMW:[id number] DOB: [birth year] DOA: 10/07/2023 PCP: Pcp, No  Outpatient Specialists:     Brief Narrative:  Patient is a 48 year old African-American male with past medical history significant for end-stage renal disease on hemodialysis TTS, combined systolic and diastolic CHF, cardiomyopathy, anemia, COVID infection, dyspnea, hypertension and morbid obesity.  Patient was admitted with acute CVA, with left hemiplegia/paresis and dysarthria.  Patient was admitted on 10/07/2023.  Patient has been under the neurology team and the ICU team.  Neurology team was [facility] assumed primary attending today, 10/15/2023.  10/15/2023: Patient seen in hemodialysis, with 1.2 L ultrafiltration.  Above documentation noted.  Left hemiplegia persists.  Dysarthria persists.  10/16/2023: Patient seen.  No new changes.  Neurology input is highly appreciated.  Awaiting CIR.     Assessment & Plan:   Principal Problem:   Acute ischemic stroke [facility]) Active Problems:   Stroke (cerebrum) (HCC)   Middle cerebral artery embolism, right   Acute CVA: - Neurology team is directing care. - MRI brain revealed large right MCA territory infarct with associated diffuse cortical thickening and some mass effect. - MRI angio head revealed distal right M2 occlusion. - Echo revealed EF of 55 to 60% and nondiagnostic bubble study. - Patient is currently on aspirin   ESRD on hemodialysis: - Seen on hemodialysis today with 1.2 L ultrafiltration. -Continue hemodialysis TTS.  Combined systolic/diastolic CHF/cardiomyopathy: - Compensated. - Continue dialysis.  Hypertension: - Controlled.  Anemia: - Hemoglobin of 10.7 g/dL. MCV of 101.2.   DVT prophylaxis:  Code Status: Full code. Family Communication:  Disposition Plan: Inpatient.   Consultants:  Patient has been under neurology and ICU team.  Procedures:    Antimicrobials:      Subjective: No new complaints. No significant  history.  Objective: Vitals:   10/16/23 0448 10/16/23 0729 10/16/23 1151 10/16/23 1534  BP: 113/79 (!) 131/96 (!) 144/91 (!) 141/84  Pulse: 100 98 95 94  Resp: 12 18 18 20   Temp: 98.3 F (36.8 C) 98.5 F (36.9 C) 98.3 F (36.8 C) 98 F (36.7 C)  TempSrc: Oral Oral Oral Oral  SpO2: 100% 95% 96% 100%  Weight:      Height:        Intake/Output Summary (Last 24 hours) at 10/16/2023 1918 Last data filed at 10/16/2023 1741 Gross per 24 hour  Intake 30 ml  Output --  Net 30 ml   Filed Weights   10/15/23 0500 10/15/23 1248 10/16/23 0423  Weight: 134 kg 133 kg 130.1 kg    Examination:  General exam: Appears calm and comfortable.  Patient is obese.  Left-sided hemiplegia noted.  Dysarthria noted. Respiratory system: Clear to auscultation.  Cardiovascular system: S1 & S2 heard.   Gastrointestinal system: Abdomen is soft and nontender.   Central nervous system: Left hemiplegia. Extremities no leg edema.  Data Reviewed: I have personally reviewed following labs and imaging studies  CBC: Recent Labs  Lab 10/10/23 0625 10/11/23 0547 10/12/23 0605 10/12/23 1426 10/13/23 0552 10/14/23 0618 10/15/23 0321  WBC 11.6* 11.2* 13.6*  --  15.8* 16.2* 16.0*  NEUTROABS 8.5*  --   --   --   --   --   --   HGB 10.6* 9.9* 10.5* 10.2* 10.9* 11.9* 10.7*  HCT 31.8* 30.7* 32.1* 30.0* 33.8* 38.2* 33.9*  MCV 96.7 98.7 99.4  --  99.4 100.5* 101.2*  PLT 236 233 261  --  301 350 358   Basic Metabolic Panel: Recent Labs  Lab 10/11/23 0547 10/12/23 0605 10/12/23 1426 10/13/23 0552 10/14/23 0618 10/15/23 0321  NA 136 136 137 142 140 140  K 4.8 4.1 3.9 4.3 4.3 4.5  CL 95* 94*  --  95* 91* 92*  CO2 23 23  --  21* 25 24  GLUCOSE 151* 122*  --  116* 114* 123*  BUN 68* 60*  --  87* 72* 105*  CREATININE 12.66* 12.03*  --  15.18* 13.26* 16.04*  CALCIUM  9.6 10.0  --  10.5* 10.8* 10.3  PHOS  --   --   --  11.4*  --   --    GFR: Estimated Creatinine Clearance: 8.1 mL/min (A) (by C-G formula  based on SCr of 16.04 mg/dL (H)). Liver Function Tests: No results for input(s): AST, ALT, ALKPHOS, BILITOT, PROT, ALBUMIN in the last 168 hours. No results for input(s): LIPASE, AMYLASE in the last 168 hours. No results for input(s): AMMONIA in the last 168 hours. Coagulation Profile: No results for input(s): INR, PROTIME in the last 168 hours. Cardiac Enzymes: No results for input(s): CKTOTAL, CKMB, CKMBINDEX, TROPONINI in the last 168 hours. BNP (last 3 results) No results for input(s): PROBNP in the last 8760 hours. HbA1C: No results for input(s): HGBA1C in the last 72 hours. CBG: Recent Labs  Lab 10/16/23 0047 10/16/23 0508 10/16/23 0755 10/16/23 1137 10/16/23 1535  GLUCAP 122* 112* 125* 110* 101*   Lipid Profile: No results for input(s): CHOL, HDL, LDLCALC, TRIG, CHOLHDL, LDLDIRECT in the last 72 hours. Thyroid Function Tests: No results for input(s): TSH, T4TOTAL, FREET4, T3FREE, THYROIDAB in the last 72 hours. Anemia Panel: No results for input(s): VITAMINB12, FOLATE, FERRITIN, TIBC, IRON , RETICCTPCT in the last 72 hours. Urine analysis:    Component Value Date/Time   COLORURINE YELLOW 06/14/2016 1939   APPEARANCEUR CLEAR 06/14/2016 1939   LABSPEC 1.011 06/14/2016 1939   PHURINE 6.0 06/14/2016 1939   GLUCOSEU NEGATIVE 06/14/2016 1939   HGBUR NEGATIVE 06/14/2016 1939   BILIRUBINUR NEGATIVE 06/14/2016 1939   KETONESUR NEGATIVE 06/14/2016 1939   PROTEINUR 100 (A) 06/14/2016 1939   NITRITE NEGATIVE 06/14/2016 1939   LEUKOCYTESUR NEGATIVE 06/14/2016 1939   Sepsis Labs: @LABRCNTIP (procalcitonin:4,lacticidven:4)  ) Recent Results (from the past 240 hours)  SARS Coronavirus 2 by RT PCR (hospital order, performed in [facility] hospital lab) *cepheid single result test* Anterior Nasal Swab     Status: None   Collection Time: 10/07/23 10:01 AM   Specimen: Anterior Nasal Swab  Result Value Ref Range  Status   SARS Coronavirus 2 by RT PCR NEGATIVE NEGATIVE Final    Comment: Performed at [facility], [address] [place], [place] [address]  MRSA Next Gen by PCR, Nasal     Status: None   Collection Time: 10/07/23  1:29 PM   Specimen: Nasal Mucosa; Nasal Swab  Result Value Ref Range Status   MRSA by PCR Next Gen NOT DETECTED NOT DETECTED Final    Comment: (NOTE) The GeneXpert MRSA Assay (FDA approved for NASAL specimens only), is one component of a comprehensive MRSA colonization surveillance program. It is not intended to diagnose MRSA infection nor to guide or monitor treatment for MRSA infections. Test performance is not FDA approved in patients less than 76 years old. Performed at [facility], [address] [place], [place] [address]   Culture, Respiratory w Gram Stain     Status: None   Collection Time: 10/08/23  6:27 AM   Specimen: Tracheal Aspirate; Respiratory  Result Value Ref Range Status   Specimen Description TRACHEAL ASPIRATE  Final   Special Requests NONE  Final   Gram Stain   Final    FEW WBC PRESENT, PREDOMINANTLY PMN RARE GRAM POSITIVE COCCI    Culture   Final    Normal respiratory flora-no Staph aureus or Pseudomonas seen Performed at [facility], [address] [place], [place] [address]    Report Status 10/10/2023 FINAL  Final         Radiology Studies: No results found.       Scheduled Meds:  amLODipine   10 mg Per Tube Daily   aspirin   81 mg Per Tube Daily   atorvastatin   40 mg Per Tube Daily   carvedilol   6.25 mg Per Tube BID WC   Chlorhexidine  Gluconate Cloth  6 each Topical Q0600   clopidogrel   75 mg Per Tube Daily   docusate  100 mg Per Tube BID   feeding supplement (PROSource TF20)  60 mL Per Tube Daily   free water   30 mL Per Tube Q6H   heparin   1,500 Units Intravenous Once   heparin  injection (subcutaneous)  5,000 Units Subcutaneous Q8H   insulin  aspart  0-6 Units Subcutaneous Q4H   isosorbide  dinitrate   5 mg Per Tube BID   lanthanum   1,000 mg Per Tube TID WC   mouth rinse  15 mL Mouth Rinse 4 times per day   pantoprazole  (PROTONIX ) IV  40 mg Intravenous QHS   Continuous Infusions:  feeding supplement (OSMOLITE 1.5 CAL) 35 mL/hr at 10/16/23 1600     LOS: 9 days    Time spent: 35 minutes.    Leatrice Chapel, MD  [facility] Pager #: [phone] 7PM-7AM contact night coverage as above

## 2023-10-17 ENCOUNTER — Inpatient Hospital Stay (HOSPITAL_COMMUNITY)

## 2023-10-17 DIAGNOSIS — E785 Hyperlipidemia, unspecified: Secondary | ICD-10-CM | POA: Diagnosis not present

## 2023-10-17 DIAGNOSIS — I10 Essential (primary) hypertension: Secondary | ICD-10-CM | POA: Insufficient documentation

## 2023-10-17 DIAGNOSIS — I779 Disorder of arteries and arterioles, unspecified: Secondary | ICD-10-CM | POA: Diagnosis not present

## 2023-10-17 DIAGNOSIS — R1310 Dysphagia, unspecified: Secondary | ICD-10-CM

## 2023-10-17 DIAGNOSIS — J9601 Acute respiratory failure with hypoxia: Secondary | ICD-10-CM | POA: Insufficient documentation

## 2023-10-17 DIAGNOSIS — I63511 Cerebral infarction due to unspecified occlusion or stenosis of right middle cerebral artery: Secondary | ICD-10-CM | POA: Diagnosis not present

## 2023-10-17 DIAGNOSIS — I6601 Occlusion and stenosis of right middle cerebral artery: Secondary | ICD-10-CM

## 2023-10-17 DIAGNOSIS — E669 Obesity, unspecified: Secondary | ICD-10-CM | POA: Insufficient documentation

## 2023-10-17 DIAGNOSIS — D72829 Elevated white blood cell count, unspecified: Secondary | ICD-10-CM | POA: Insufficient documentation

## 2023-10-17 DIAGNOSIS — N186 End stage renal disease: Secondary | ICD-10-CM | POA: Diagnosis not present

## 2023-10-17 DIAGNOSIS — I639 Cerebral infarction, unspecified: Secondary | ICD-10-CM | POA: Diagnosis not present

## 2023-10-17 LAB — BASIC METABOLIC PANEL WITH GFR
Anion gap: 20 — ABNORMAL HIGH (ref 5–15)
BUN: 103 mg/dL — ABNORMAL HIGH (ref 6–20)
CO2: 24 mmol/L (ref 22–32)
Calcium: 10 mg/dL (ref 8.9–10.3)
Chloride: 92 mmol/L — ABNORMAL LOW (ref 98–111)
Creatinine, Ser: 14.89 mg/dL — ABNORMAL HIGH (ref 0.61–1.24)
GFR, Estimated: 4 mL/min — ABNORMAL LOW (ref >60)
Glucose, Bld: 121 mg/dL — ABNORMAL HIGH (ref 70–99)
Potassium: 4.3 mmol/L (ref 3.5–5.1)
Sodium: 136 mmol/L (ref 135–145)

## 2023-10-17 LAB — CBC
HCT: 36 % — ABNORMAL LOW (ref 39.0–52.0)
Hemoglobin: 11.6 g/dL — ABNORMAL LOW (ref 13.0–17.0)
MCH: 32.1 pg (ref 26.0–34.0)
MCHC: 32.2 g/dL (ref 30.0–36.0)
MCV: 99.7 fL (ref 80.0–100.0)
Platelets: 363 K/uL (ref 150–400)
RBC: 3.61 MIL/uL — ABNORMAL LOW (ref 4.22–5.81)
RDW: 13.1 % (ref 11.5–15.5)
WBC: 12.3 K/uL — ABNORMAL HIGH (ref 4.0–10.5)
nRBC: 0.9 % — ABNORMAL HIGH (ref 0.0–0.2)

## 2023-10-17 LAB — GLUCOSE, CAPILLARY
Glucose-Capillary: 126 mg/dL — ABNORMAL HIGH (ref 70–99)
Glucose-Capillary: 124 mg/dL — ABNORMAL HIGH (ref 70–99)
Glucose-Capillary: 139 mg/dL — ABNORMAL HIGH (ref 70–99)
Glucose-Capillary: 136 mg/dL — ABNORMAL HIGH (ref 70–99)
Glucose-Capillary: 130 mg/dL — ABNORMAL HIGH (ref 70–99)

## 2023-10-17 MED ORDER — OSMOLITE 1.5 CAL PO LIQD
1000.0000 mL | ORAL | Status: DC
  Administered 2023-10-17: 1000 mL

## 2023-10-17 MED ORDER — OSMOLITE 1.5 CAL PO LIQD: 1000.0000 mL | ORAL | Status: DC

## 2023-10-17 MED ORDER — CARVEDILOL 6.25 MG PO TABS
9.3750 mg | ORAL_TABLET | Freq: Two times a day (BID) | ORAL | Status: DC
  Administered 2023-10-17 – 2023-10-18 (×3): 9.375 mg
  Filled 2023-10-17 (×3): qty 1

## 2023-10-17 MED ORDER — CHLORHEXIDINE GLUCONATE CLOTH 2 % EX PADS: 6.0000 | MEDICATED_PAD | Freq: Every day | CUTANEOUS | Status: DC

## 2023-10-17 NOTE — Progress Notes (Signed)
 Occupational Therapy Treatment Patient Details Name: Nathan Campbell MRN: [id number] DOB: 09-Mar-1976 Today's Date: 10/17/2023   History of present illness 48 yo male adm 10/07/23 with Lt-sided weakness and slurred speech with Rt MCA CVA sp TNK and IR revascularization. Intubated 7/25-7/29. 7/29 ileus. PMH: CHF, CM, ESRD on HD TTS, HTN, obesity, HLD   OT comments  Pt supine in bed and eager to participate in therapy. He does tend to keep his eyes closed, but opens with cueing; denies visual changes but not formally assessed.  Pt requires mod assist for LB dressing (2nd gown) and max assist for LB from bed level.  Grooming at EOB with min to mod assist. Min assist +2 safety for transfers. Noted some shoulder AROM today, worked on [employer] of shoulder movements. Continues to requires cueing to attend to L side and body, safety; poor problem solving and sequencing.  Highly recommend >3hrs/day inpatient setting at dc.       If plan is discharge home, recommend the following:  Assistance with cooking/housework;Assistance with feeding;Direct supervision/assist for medications management;Direct supervision/assist for financial management;Assist for transportation;Help with stairs or ramp for entrance;Supervision due to cognitive status;A lot of help with walking and/or transfers;A lot of help with bathing/dressing/bathroom   Equipment Recommendations  Other (comment) (defer)    Recommendations for Other Services Rehab consult    Precautions / Restrictions Precautions Precautions: Fall Recall of Precautions/Restrictions: Impaired Precaution/Restrictions Comments: cortrak, watch SpO2 and RR, left inattention Restrictions Weight Bearing Restrictions Per Provider Order: No       Mobility Bed Mobility Overal bed mobility: Needs Assistance Bed Mobility: Rolling, Sidelying to Sit Rolling: Contact guard assist Sidelying to sit: Contact guard assist, HOB elevated       General bed mobility comments:  pt rolling towards L side and coming up to sit with contact guard    Transfers Overall transfer level: Needs assistance Equipment used: None Transfers: Sit to/from Stand Sit to Stand: Min assist, +2 safety/equipment           General transfer comment: cueing for hand placement on seated surface, min steadying assist     Balance Overall balance assessment: Needs assistance Sitting-balance support: Single extremity supported, No upper extremity supported Sitting balance-Leahy Scale: Fair Sitting balance - Comments: preference to R UE support, contact guard for safety   Standing balance support: Single extremity supported, During functional activity, Reliant on assistive device for balance Standing balance-Leahy Scale: Poor Standing balance comment: relies on R UE and external support                           ADL either performed or assessed with clinical judgement   ADL Overall ADL's : Needs assistance/impaired Eating/Feeding: NPO   Grooming: Sitting;Oral care;Wash/dry face;Moderate assistance Grooming Details (indicate cue type and reason): needs assist for bimanual tasks, min assist for safety with oral care using suction toothbrush         Upper Body Dressing : Moderate assistance;Sitting   Lower Body Dressing: Maximal assistance;Sit to/from stand;Bed level Lower Body Dressing Details (indicate cue type and reason): assist to initate donning socks over toes but then pt able to manage over feet from bed level, needs R UE support in standing Toilet Transfer: Minimal assistance;Ambulation Toilet Transfer Details (indicate cue type and reason): quad cane, +2 safety with PT         Functional mobility during ADLs: Minimal assistance;Cane (quad cane)      Extremity/Trunk Assessment Upper Extremity Assessment  Upper Extremity Assessment: Right hand dominant;LUE deficits/detail LUE Deficits / Details: PROM WFL, impaired sensation and proprioception.  Pt  showing some AROM, but limited to shoulder elevation, retraction and shoulder f/e (extension more than flexion)- worked on [employer] with push/pull. LUE Sensation: decreased light touch;decreased proprioception LUE Coordination: decreased fine motor;decreased gross motor   Lower Extremity Assessment Lower Extremity Assessment: Defer to PT evaluation        Vision   Vision Assessment?: Vision impaired- to be further tested in functional context   Perception Perception Perception: Impaired Preception Impairment Details: Inattention/Neglect Perception-Other Comments: L inattention, R Head turn   Praxis     Communication Communication Communication: Impaired Factors Affecting Communication: Difficulty expressing self   Cognition Arousal: Alert Behavior During Therapy: Flat affect Cognition: Cognition impaired Difficult to assess due to: Impaired communication   Awareness: Online awareness impaired Memory impairment (select all impairments): Working memory Attention impairment (select first level of impairment): Sustained attention Executive functioning impairment (select all impairments): Sequencing, Problem solving, Reasoning OT - Cognition Comments: preference to keep eyes closed, cueing to maintain open. follows one step commands with incresaed time and diffculty with sequencing and probelm solving                 Following commands: Impaired Following commands impaired: Only follows one step commands consistently      Cueing   Cueing Techniques: Verbal cues, Tactile cues  Exercises Exercises: Other exercises Other Exercises Other Exercises: PROM to UE from shoulder to hand, AAROM of shoulder flexion/extension/elevation and retraction    Shoulder Instructions       General Comments      Pertinent Vitals/ Pain       Pain Assessment Pain Assessment: No/denies pain Pain Intervention(s): Monitored during session  Home Living                                           Prior Functioning/Environment              Frequency  Min 2X/week        Progress Toward Goals  OT Goals(current goals can now be found in the care plan section)  Progress towards OT goals: Progressing toward goals  Acute Rehab OT Goals Patient Stated Goal: get to rehab OT Goal Formulation: With patient Time For Goal Achievement: 10/23/23 Potential to Achieve Goals: Good  Plan      Co-evaluation                 AM-PAC OT 6 Clicks Daily Activity     Outcome Measure   Help from another person eating meals?: Total Help from another person taking care of personal grooming?: A Lot Help from another person toileting, which includes using toliet, bedpan, or urinal?: A Lot Help from another person bathing (including washing, rinsing, drying)?: A Lot Help from another person to put on and taking off regular upper body clothing?: A Lot Help from another person to put on and taking off regular lower body clothing?: A Lot 6 Click Score: 11    End of Session Equipment Utilized During Treatment: Gait belt;Oxygen  OT Visit Diagnosis: Unsteadiness on feet (R26.81);Other abnormalities of gait and mobility (R26.89);Muscle weakness (generalized) (M62.81)   Activity Tolerance Patient tolerated treatment well   Patient Left in chair;with call bell/phone within reach;with chair alarm set   Nurse Communication Mobility status;Precautions  Time: 9070-8993 OT Time Calculation (min): 37 min  Charges: OT General Charges $OT Visit: 1 Visit OT Treatments $Self Care/Home Management : 8-22 mins $Therapeutic Activity: 8-22 mins  Etta NOVAK, OT Acute Rehabilitation Services Office [phone] Secure Chat Preferred    Etta GORMAN [place] 10/17/2023, 10:33 AM

## 2023-10-17 NOTE — Care Management Important Message (Signed)
 Important Message  Patient Details  Name: Nathan Campbell MRN: [id number] Date of Birth: 10-13-1975   Important Message Given:  Yes - Medicare IM     Claretta Deed 10/17/2023, 4:23 PM

## 2023-10-17 NOTE — Progress Notes (Signed)
 Modified Barium Swallow Study  Patient Details  Name: Nathan Campbell MRN: [id number] Date of Birth: 12-11-1975  Today's Date: 10/17/2023  Modified Barium Swallow completed.  Full report located under Chart Review in the Imaging Section.  History of Present Illness Pt is a 48 y/o male who presented 10/07/23 with left-sided weakness and slurred speech. MRI brain 7/26: Large right MCA territory infarct with associated diffuse cortical thickening and some mass effect. Pt s/p TNK & IR revascularization. Intubated 7/25-7/29. 7/29 ileus. PMH: CHF, CM, ESRD on HD TTS, HTN, obesity, HLD   Clinical Impression Pt demonstrates a moderate oral dysphagia as well as late initiation of swallowing. Pt has severe left lingual weakness, tongue deviated left, pooled oral secretions spilling on the left and causing coughing prior to starting exam. Despite this pt able to achieve posterior transit of each texture with repetitive slow posterior lingual propulsion and minimal anterior spillage of PO. Able to use a straw. Pt does not form a bolus and only mashes solids with tongue to palate, cannot manipulate for mastication. All boluses pool for several seconds in the pyriform sinsues prior to hyoid burst. Pt had only one instance of aspiration of thin liquids as swallow initiated. No sensation. Recommend initiating a puree diet and nectar thick liquids. Suspect pts secretion management may improve with oral intake and may be easily advance to thin liquids or improved solids. Pt will need pills crushed in puree as he could not orally transit barium tablet. Factors that may increase risk of adverse event in presence of aspiration Noe & Lianne 2021):    Swallow Evaluation Recommendations Recommendations: PO diet PO Diet Recommendation: Dysphagia 1 (Pureed);Mildly thick liquids (Level 2, nectar thick) Liquid Administration via: Cup;Straw Medication Administration: Crushed with puree Supervision: Staff to assist with  self-feeding Oral care recommendations: Use suctioning for oral care Caregiver Recommendations: Have oral suction available    Consuelo Fort, MA CCC-SLP  Acute Rehabilitation Services Secure Chat Preferred Office [phone]    [place] 10/17/2023,1:00 PM

## 2023-10-17 NOTE — Progress Notes (Addendum)
 Inpatient Rehab Coordinator Note:  I met with patient at bedside to discuss CIR recommendations and goals/expectations of CIR stay.  We reviewed 3 hrs/day of therapy, physician follow up, and average length of stay 2 weeks (dependent upon progress) with goals of supervision 24/7.  Pt states his nephew is planning to provide assist at discharge.  He does confirm he lives on the top floor of the apartment.  We reviewed need for prior auth and I also left a message with pt's sister to confirm caregiver support.    1530: I did receive insurance approval and mom plans to provide support at discharge.  I will f/u for potential admit tomorrow.    Reche Lowers, PT, DPT Admissions Coordinator [phone] 10/17/23  11:58 AM

## 2023-10-17 NOTE — Plan of Care (Signed)
 Ambulating in halls with therapy and a cane. New diet started today.     Problem: Education: Goal: Knowledge of disease or condition will improve Outcome: Progressing   Problem: Education: Goal: Knowledge of secondary prevention will improve (MUST DOCUMENT ALL) Outcome: Progressing   Problem: Education: Goal: Knowledge of patient specific risk factors will improve (DELETE if not current risk factor) Outcome: Progressing   Problem: Coping: Goal: Will verbalize positive feelings about self Outcome: Progressing   Problem: Nutrition: Goal: Risk of aspiration will decrease Outcome: Progressing   Problem: Clinical Measurements: Goal: Will remain free from infection Outcome: Progressing   Problem: Activity: Goal: Risk for activity intolerance will decrease Outcome: Progressing   Problem: Safety: Goal: Ability to remain free from injury will improve Outcome: Progressing   Problem: Skin Integrity: Goal: Risk for impaired skin integrity will decrease Outcome: Progressing

## 2023-10-17 NOTE — Progress Notes (Addendum)
 STROKE TEAM PROGRESS NOTE  Hospital events 7/25: Patient admitted with right MCA territory infarct, given TNK and taken to interventional radiology for mechanical thrombectomy 7/29: Patient extubated 7/29: Ileus, tachypnea, now on heated high flow 8/1: Patient transferred out of ICU  INTERIM HISTORY/SUBJECTIVE No family at the bedside.  Patient sitting in the chair in no apparent distress.  No new neurological events overnight.  He appears to be slightly labored so we will order chest x-ray  CBC    Component Value Date/Time   WBC 12.3 (H) 10/17/2023 0117   RBC 3.61 (L) 10/17/2023 0117   HGB 11.6 (L) 10/17/2023 0117   HGB 9.2 (L) 01/22/2021 1235   HCT 36.0 (L) 10/17/2023 0117   HCT 28.9 (L) 01/22/2021 1235   PLT 363 10/17/2023 0117   PLT 208 01/22/2021 1235   MCV 99.7 10/17/2023 0117   MCV 93 01/22/2021 1235   MCH 32.1 10/17/2023 0117   MCHC 32.2 10/17/2023 0117   RDW 13.1 10/17/2023 0117   RDW 12.0 01/22/2021 1235   LYMPHSABS 1.1 10/10/2023 0625   LYMPHSABS 0.8 01/22/2021 1235   MONOABS 1.6 (H) 10/10/2023 0625   EOSABS 0.3 10/10/2023 0625   EOSABS 0.1 01/22/2021 1235   BASOSABS 0.0 10/10/2023 0625   BASOSABS 0.0 01/22/2021 1235    BMET    Component Value Date/Time   NA 136 10/17/2023 0117   K 4.3 10/17/2023 0117   CL 92 (L) 10/17/2023 0117   CO2 24 10/17/2023 0117   GLUCOSE 121 (H) 10/17/2023 0117   BUN 103 (H) 10/17/2023 0117   CREATININE 14.89 (H) 10/17/2023 0117   CALCIUM  10.0 10/17/2023 0117   GFRNONAA 4 (L) 10/17/2023 0117    IMAGING past 24 hours DG Swallowing Func-Speech Pathology Result Date: 10/17/2023 Table formatting from the original result was not included. Modified Barium Swallow Study Patient Details Name: Nathan Campbell MRN: [id number] Date of Birth: 02-Mar-1976 Today's Date: 10/17/2023 HPI/PMH: HPI: Pt is a 48 y/o male who presented 10/07/23 with left-sided weakness and slurred speech. MRI brain 7/26: Large right MCA territory infarct with associated  diffuse cortical thickening and some mass effect. Pt s/p TNK & IR revascularization. Intubated 7/25-7/29. 7/29 ileus. PMH: CHF, CM, ESRD on HD TTS, HTN, obesity, HLD Clinical Impression: Clinical Impression: Pt demonstrates a moderate oral dysphagia as well as late initiation of swallowing. Pt has severe left lingual weakness, tongue deviated left, pooled oral secretions spilling on the left and causing coughing prior to starting exam. Despite this pt able to achieve posterior transit of each texture with repetitive slow posterior lingual propulsion and minimal anterior spillage of PO. Able to use a straw. Pt does not form a bolus and only mashes solids with tongue to palate, cannot manipulate for mastication. All boluses pool for several seconds in the pyriform sinsues prior to hyoid burst. Pt had only one instance of aspiration of thin liquids as swallow initiated. No sensation. Recommend initiating a puree diet and nectar thick liquids. Suspect pts secretion management may improve with oral intake and may be easily advance to thin liquids or improved solids. Pt will need pills crushed in puree as he could not orally transit barium tablet. Factors that may increase risk of adverse event in presence of aspiration Noe & Lianne 2021): No data recorded Recommendations/Plan: Swallowing Evaluation Recommendations Swallowing Evaluation Recommendations Recommendations: PO diet PO Diet Recommendation: Dysphagia 1 (Pureed); Mildly thick liquids (Level 2, nectar thick) Liquid Administration via: Cup; Straw Medication Administration: Crushed with puree Supervision: Staff to assist  with self-feeding Oral care recommendations: Use suctioning for oral care Caregiver Recommendations: Have oral suction available Treatment Plan Treatment Plan Treatment recommendations: Therapy as outlined in treatment plan below Follow-up recommendations: Acute inpatient rehab (3 hours/day) Treatment frequency: Min 2x/week Treatment duration:  2 weeks Interventions: Patient/family education; Trials of upgraded texture/liquids; Diet toleration management by SLP Recommendations Recommendations for follow up therapy are one component of a multi-disciplinary discharge planning process, led by the attending physician.  Recommendations may be updated based on patient status, additional functional criteria and insurance authorization. Assessment: Orofacial Exam: Orofacial Exam Oral Cavity: Oral Hygiene: Pooled secretions Oral Cavity - Dentition: Adequate natural dentition Orofacial Anatomy: WFL Oral Motor/Sensory Function: Suspected cranial nerve impairment CN V - Trigeminal: Not tested CN VII - Facial: Left motor impairment CN IX - Glossopharyngeal, CN X - Vagus: Not tested CN XII - Hypoglossal: Left motor impairment Anatomy: Anatomy: WFL Boluses Administered: Boluses Administered Boluses Administered: Thin liquids (Level 0); Mildly thick liquids (Level 2, nectar thick); Puree; Solid  Oral Impairment Domain: Oral Impairment Domain Lip Closure: Interlabial escape, no progression to anterior lip Tongue control during bolus hold: Posterior escape of greater than half of bolus Bolus preparation/mastication: Minimal chewing/mashing with majority of bolus unchewed Bolus transport/lingual motion: Slow tongue motion Oral residue: Trace residue lining oral structures Location of oral residue : Lateral sulci Initiation of pharyngeal swallow : Pyriform sinuses  Pharyngeal Impairment Domain: Pharyngeal Impairment Domain Soft palate elevation: No bolus between soft palate (SP)/pharyngeal wall (PW) Laryngeal elevation: Complete superior movement of thyroid cartilage with complete approximation of arytenoids to epiglottic petiole Anterior hyoid excursion: Complete anterior movement Epiglottic movement: Complete inversion Laryngeal vestibule closure: Complete, no air/contrast in laryngeal vestibule Pharyngeal stripping wave : Present - complete Pharyngeal contraction (A/P view  only): Complete Pharyngoesophageal segment opening: Complete distension and complete duration, no obstruction of flow Tongue base retraction: No contrast between tongue base and posterior pharyngeal wall (PPW) Pharyngeal residue: Complete pharyngeal clearance  Esophageal Impairment Domain: No data recorded Pill: No data recorded Penetration/Aspiration Scale Score: Penetration/Aspiration Scale Score 1.  Material does not enter airway: Mildly thick liquids (Level 2, nectar thick); Puree; Solid 8.  Material enters airway, passes BELOW cords without attempt by patient to eject out (silent aspiration) : Thin liquids (Level 0) Compensatory Strategies: No data recorded  General Information: Caregiver present: No  Diet Prior to this Study: NPO; Cortrak/Small bore NG tube   No data recorded  No data recorded  Supplemental O2: None (Room air)   History of Recent Intubation: Yes  Behavior/Cognition: Alert; Cooperative Self-Feeding Abilities: Dependent for feeding Baseline vocal quality/speech: Abnormal resonance Volitional Cough: Able to elicit Volitional Swallow: Able to elicit No data recorded Goal Planning: Prognosis for improved oropharyngeal function: Good No data recorded No data recorded Patient/Family Stated Goal: none stated No data recorded Pain: Pain Assessment Pain Assessment: No/denies pain Pain Intervention(s): Monitored during session End of Session: Start Time:SLP Start Time (ACUTE ONLY): 1150 Stop Time: SLP Stop Time (ACUTE ONLY): 1212 Time Calculation:SLP Time Calculation (min) (ACUTE ONLY): 22 min Charges: SLP Evaluations $ SLP Speech Visit: 1 Visit SLP Evaluations $BSS Swallow: 1 Procedure $MBS Swallow: 1 Procedure $Speech Treatment for Individual: 1 Procedure SLP visit diagnosis: SLP Visit Diagnosis: Dysphagia, oropharyngeal phase (R13.12) Past Medical History: Past Medical History: Diagnosis Date  Acute combined systolic and diastolic CHF, NYHA class 4 (HCC) 06/10/2016  Anemia   Cardiomyopathy (HCC)  06/14/2016  CHF (congestive heart failure) (HCC)   COVID 10/2020  Dyspnea   ESRD on  hemodialysis (HCC)   TTS at [facility]  Hypertension   Hypertensive heart and kidney disease with heart failure (HCC) 06/14/2016  Hypertensive heart disease with congestive heart failure (HCC)   Obesity  Past Surgical History: Past Surgical History: Procedure Laterality Date  A/V FISTULAGRAM Left 05/03/2022  Procedure: A/V Fistulagram;  Surgeon: Eliza Lonni RAMAN, MD;  Location: [facility] INVASIVE CV LAB;  Service: Cardiovascular;  Laterality: Left;  AV FISTULA PLACEMENT Left 11/11/2020  Procedure: LEFT ARM First Stage Basilic Vein Fistula Creation;  Surgeon: Sheree Penne Lonni, MD;  Location: [facility] OR;  Service: Vascular;  Laterality: Left;  BASCILIC VEIN TRANSPOSITION Left 01/23/2021  Procedure: Second Stage Basilic Vein Transposition of Left Arm Arteriovenous  Fistula;  Surgeon: Sheree Penne Lonni, MD;  Location: [facility] OR;  Service: Vascular;  Laterality: Left;  Block  to LMA   COLONOSCOPY    FISTULA SUPERFICIALIZATION Left 05/07/2022  Procedure: PLICATION OF ANEURYSM, LEFT ARM FISTULA;  Surgeon: Eliza Lonni RAMAN, MD;  Location: [facility] OR;  Service: Vascular;  Laterality: Left;  IR CT HEAD LTD  10/07/2023  IR CT HEAD LTD  10/07/2023  IR PATIENT EVAL TECH 0-60 MINS  10/07/2023  IR PERCUTANEOUS ART THROMBECTOMY/INFUSION INTRACRANIAL INC DIAG ANGIO  10/07/2023  NO PAST SURGERIES    PERIPHERAL VASCULAR BALLOON ANGIOPLASTY Left 05/03/2022  Procedure: PERIPHERAL VASCULAR BALLOON ANGIOPLASTY;  Surgeon: Eliza Lonni RAMAN, MD;  Location: [facility] INVASIVE CV LAB;  Service: Cardiovascular;  Laterality: Left;  AVF  RADIOLOGY WITH ANESTHESIA N/A 10/07/2023  Procedure: RADIOLOGY WITH ANESTHESIA;  Surgeon: Radiologist, Medication, MD;  Location: [facility] OR;  Service: Radiology;  Laterality: N/A; Consuelo Fort, MA CCC-SLP Acute Rehabilitation Services Secure Chat Preferred Office [phone] [place] 10/17/2023, 1:02  PM      Vitals:   10/17/23 0322 10/17/23 0413 10/17/23 0748 10/17/23 1150  BP: (!) 171/99  (!) 157/79 124/74  Pulse: 94  88 99  Resp: 18  17 (!) 30  Temp: 98.9 F (37.2 C)  97.6 F (36.4 C) 97.7 F (36.5 C)  TempSrc: Oral  Oral Oral  SpO2: 97%  95% 97%  Weight:  128.7 kg    Height:         PHYSICAL EXAM General: Obese middle-aged African-American male.   CV: Regular rate and rhythm on monitor Respiratory: Respirations regular and unlabored on room air GI: Abdomen soft and nontender   NEURO: awake and alert. pt orientated to age, place, time. No aphasia but paucity of speech, following all simple commands, dysarthria. Able to name and repeat in dysarthric voice. No gaze palsy, tracking bilaterally, visual field testing not quite cooperative, keep turning eyes on the left to count fingers, PERRL. Left mild facial droop. Tongue midline. RUE and RLE 5/5. LUE 0/5 and LLE proximal 3-/5 and distal DF 4/5. Sensation symmetrical bilaterally subjectively, R FTN intact, gait not tested.     ASSESSMENT/PLAN  Nathan Campbell is a 48 y.o. male with history of obesity, hypertension, end-stage renal disease on HD TTS, combined systolic and diastolic heart failure who presented to the emergency department on 7/25 with left-sided weakness, slurred speech.  NIH on Admission 13. IV TNK after CT head showed what looked like a calcific embolus in the right MCA and a good ASPECT  score and CT angiogram could not be obtained with emergent cerebral catheter angiogram showed right M1 occlusion and he underwent  TICI 2 b revascularization of the superior and inferior division to the M2 region but was left intubated for medical issues given  history of CHF and renal failure.  He has since been extubated and is ready to be transferred out of the ICU.  Stroke:  right MCA large infarct with right M1 occlusion s/p TNK and IR with TICI2b, etiology: Likely large vessel disease given calcified plaque Code  Stroke CT head - Findings consistent with acute right MCA territory ischemic changes, ASPECT score 7. And calcific density is noted in the proximal right M1 segment. This is most concerning for a calcific embolus. Intracranial atherosclerotic changes considered less likely. S/p IR with right M1 occlusion achieved TICI2b reperfusion MRI   Large right MCA territory infarct with associated diffuse cortical thickening and some mass effect MRA  Distal right M2 occlusion, somewhat more proximal than on the final angio images. No new occlusion is present. Repeat CT Head-  Expected evolution of the large right MCA territory infarct with effacement of the sulci and hyperdense thrombus within sylvian branches of the right MCA. No significant midline shift. 2D Echo EF 50-55% LDL 85 HgbA1c 5.1 UDS negative VTE prophylaxis - heparin  subcu No antithrombotic prior to admission, now on aspirin  81 and Plavix  75 DAPT for 3 months and then ASA alone. Therapy recommendations: CIR Disposition: Pending  Acute hypoxic and hypercapnic respiratory failure Intubated for procedure Postprocedure extubated to high flow nasal cannula Now tolerating well, transferred out of ICU On  3LPM  CXR ordered   Combined systolic and diastolic heart failure Hypertension Home meds: Amlodipine , isosorbide  mononitrate, furosemide , carvedilol  Now on Norvasc , Coreg  and isosorbide  BP stable Long-term BP goal normotensive  Hyperlipidemia Home meds none LDL 85 goal < 70 Elevated TG [id number] Add atorvastatin  40 Continue statin at discharge  Dysphagia Now still NPO able to have ice chip with full supervision Now on dys 1 diet with nectar thick liquid.  decrease tube feeding @ 35 with water  flush - will consult dietitian to see if TF still needed  ESRD on HD Nephrology consulted BUN 103, Cr 14.89 HD Tuesday Thursday Saturday  Other stroke risk factors Obesity, BMI 39.77  Other acute medical issues Leukocytosis WBC  13.6--15.8--16.2--16.0-12.3, afebrile Back pain - Tylenol  and Flexeril  as needed   Hospital day # 10  Karna Geralds DNP, ACNPC-AG  [facility]  ATTENDING NOTE: I reviewed above note and agree with the assessment and plan. Pt was seen and examined.   No family at bedside.  Patient lying in bed, no complaints.  Still has left upper extremity plegia and left lower extremity paresis.  Pending CIR placement.  Patient seems to have mild tachypnea, bedside swallow today, on dysphagia 1 and nectar thick liquid, will decrease tube feeding.  Consult dietitian in a.m. to see if he still need tube feeding.  Plan for dialysis tomorrow.  For detailed assessment and plan, please refer to above as I have made changes wherever appropriate.   Ary Cummins, MD PhD Stroke Neurology 10/17/2023 9:59 PM

## 2023-10-17 NOTE — Progress Notes (Signed)
 PROGRESS NOTE  Nathan Campbell FMW:[id number] DOB: [birth year]   PCP: Pcp, No  Patient is from: Home.  DOA: 10/07/2023 LOS: 10  Chief complaints Chief Complaint  Patient presents with   Code Stroke     Brief Narrative / Interim history: 48 year old M with PMH of ESRD on HD TTS, combined CHF/CM, morbid obesity and HTN presenting with acute left hemiparesis and dysarthria admitted to ICU for acute CVA.  Patient underwent TNK, and transferred out of ICU to regular floor.  Still with dysarthria, left facial droop and left arm weakness.  He is on tube feed due to dysphagia.  Therapy recommended CIR.  Subjective: Seen and examined earlier this morning.  No major events overnight or this morning.  No major events overnight of this morning.  No complaints.  Objective: Vitals:   10/17/23 0322 10/17/23 0413 10/17/23 0748 10/17/23 1150  BP: (!) 171/99  (!) 157/79 124/74  Pulse: 94  88 99  Resp: 18  17 (!) 30  Temp: 98.9 F (37.2 C)  97.6 F (36.4 C) 97.7 F (36.5 C)  TempSrc: Oral  Oral Oral  SpO2: 97%  95% 97%  Weight:  128.7 kg    Height:        Examination:  GENERAL: No apparent distress.  Nontoxic. HEENT: MMM.  Vision and hearing grossly intact.  NECK: Supple.  No apparent JVD.  RESP:  No IWOB.  Fair aeration bilaterally. CVS:  RRR. Heart sounds normal.  ABD/GI/GU: BS+. Abd soft, NTND.  MSK/EXT:  Moves extremities. No apparent deformity. No edema.  SKIN: no apparent skin lesion or wound NEURO: AA.  Oriented appropriately.  Dysarthria.  Left facial droop.  Motor 0/5 in LUE.  4/5 in LLE and 5/5 elsewhere.  No apparent focal neuro deficit. PSYCH: Calm. Normal affect.   Consultants:  Neurology Nephrology  Procedures: None  Microbiology summarized: MRSA PCR screen nonreactive Respiratory culture with normal flora  Assessment and plan: Acute right MCA CVA: Presents with left-sided weakness, dysarthria and left facial droop.  MRI showed large right MCA territory infarct  with associated diffuse cortical thickening and some mass effect but no midline shift.  Patient received TNK while in ICU.  MRI angio head revealed distal right M2 occlusion.  TTE without significant finding.  LDL 85.  A1c 5.1%.  Transferred out of ICU to regular floor on 8/2. - Continue aspirin , Lipitor, and Plavix . - Therapy recommended CIR.  CIR following.  Dysphagia due to stroke -Advance to dysphagia 1 diet on 8/4 -Tube feed via cortrack - Aspiration precaution  ESRD on HD TTS -Hemodialysis per nephrology.   Chronic combined CHF/cardiomyopathy: TTE with LVEF of 55 to 60% G2-DD, severe RAE.  (EF was 35% with G2 DD in 2018).  Appears euvolemic on exam. -Fluid management by dialysis   Essential hypertension: Fairly controlled. - Continue amlodipine   - Increase Coreg  to 9.375 mg twice daily -Continue low-dose disorder -IV labetalol  as needed   Anemia of renal disease: Stable -Monitor  Mild leukocytosis: Likely demargination - Continue monitoring  Class II obesity Body mass index is 38.48 kg/m. Nutrition Problem: Inadequate oral intake Etiology: inability to eat Signs/Symptoms: NPO status Interventions: Tube feeding   DVT prophylaxis:  heparin  bolus via infusion 1,500 Units Start: 10/10/23 0500 heparin  injection 5,000 Units Start: 10/08/23 1400 SCDs Start: 10/07/23 1314  Code Status: Full code Family Communication: None at bedside Level of care: Telemetry Medical Status is: Inpatient Remains inpatient appropriate because: CIR bed   Final disposition: CIR  55 minutes with more than 50% spent in reviewing records, counseling patient/family and coordinating care.   Sch Meds:  Scheduled Meds:  amLODipine   10 mg Per Tube Daily   aspirin   81 mg Per Tube Daily   atorvastatin   40 mg Per Tube Daily   carvedilol   9.375 mg Per Tube BID   Chlorhexidine  Gluconate Cloth  6 each Topical Q0600   clopidogrel   75 mg Per Tube Daily   docusate  100 mg Per Tube BID   feeding  supplement (PROSource TF20)  60 mL Per Tube Daily   free water   30 mL Per Tube Q6H   heparin   1,500 Units Intravenous Once   heparin  injection (subcutaneous)  5,000 Units Subcutaneous Q8H   insulin  aspart  0-6 Units Subcutaneous Q4H   isosorbide  dinitrate  5 mg Per Tube BID   lanthanum   1,000 mg Per Tube TID WC   mouth rinse  15 mL Mouth Rinse 4 times per day   pantoprazole  (PROTONIX ) IV  40 mg Intravenous QHS   Continuous Infusions:  feeding supplement (OSMOLITE 1.5 CAL) 1,000 mL (10/17/23 0415)   PRN Meds:.acetaminophen  **OR** acetaminophen  (TYLENOL ) oral liquid 160 mg/5 mL **OR** acetaminophen , bisacodyl , cyclobenzaprine , hydrALAZINE , labetalol , ondansetron  (ZOFRAN ) IV, mouth rinse  Antimicrobials: Anti-infectives (From admission, onward)    Start     Dose/Rate Route Frequency Ordered Stop   10/10/23 1000  Ampicillin -Sulbactam (UNASYN ) 3 g in sodium chloride  0.9 % 100 mL IVPB        3 g 200 mL/hr over 30 Minutes Intravenous Every 12 hours 10/10/23 0726 10/14/23 2330        I have personally reviewed the following labs and images: CBC: Recent Labs  Lab 10/12/23 0605 10/12/23 1426 10/13/23 0552 10/14/23 0618 10/15/23 0321 10/17/23 0117  WBC 13.6*  --  15.8* 16.2* 16.0* 12.3*  HGB 10.5* 10.2* 10.9* 11.9* 10.7* 11.6*  HCT 32.1* 30.0* 33.8* 38.2* 33.9* 36.0*  MCV 99.4  --  99.4 100.5* 101.2* 99.7  PLT 261  --  301 350 358 363   BMP &GFR Recent Labs  Lab 10/12/23 0605 10/12/23 1426 10/13/23 0552 10/14/23 0618 10/15/23 0321 10/17/23 0117  NA 136 137 142 140 140 136  K 4.1 3.9 4.3 4.3 4.5 4.3  CL 94*  --  95* 91* 92* 92*  CO2 23  --  21* 25 24 24   GLUCOSE 122*  --  116* 114* 123* 121*  BUN 60*  --  87* 72* 105* 103*  CREATININE 12.03*  --  15.18* 13.26* 16.04* 14.89*  CALCIUM  10.0  --  10.5* 10.8* 10.3 10.0  PHOS  --   --  11.4*  --   --   --    Estimated Creatinine Clearance: 8.7 mL/min (A) (by C-G formula based on SCr of 14.89 mg/dL (H)). Liver &  Pancreas: No results for input(s): AST, ALT, ALKPHOS, BILITOT, PROT, ALBUMIN in the last 168 hours. No results for input(s): LIPASE, AMYLASE in the last 168 hours. No results for input(s): AMMONIA in the last 168 hours. Diabetic: No results for input(s): HGBA1C in the last 72 hours. Recent Labs  Lab 10/16/23 1948 10/16/23 2331 10/17/23 0323 10/17/23 0746 10/17/23 1144  GLUCAP 99 135* 126* 124* 139*   Cardiac Enzymes: No results for input(s): CKTOTAL, CKMB, CKMBINDEX, TROPONINI in the last 168 hours. No results for input(s): PROBNP in the last 8760 hours. Coagulation Profile: No results for input(s): INR, PROTIME in the last 168 hours. Thyroid Function Tests: No  results for input(s): TSH, T4TOTAL, FREET4, T3FREE, THYROIDAB in the last 72 hours. Lipid Profile: No results for input(s): CHOL, HDL, LDLCALC, TRIG, CHOLHDL, LDLDIRECT in the last 72 hours. Anemia Panel: No results for input(s): VITAMINB12, FOLATE, FERRITIN, TIBC, IRON , RETICCTPCT in the last 72 hours. Urine analysis:    Component Value Date/Time   COLORURINE YELLOW 06/14/2016 1939   APPEARANCEUR CLEAR 06/14/2016 1939   LABSPEC 1.011 06/14/2016 1939   PHURINE 6.0 06/14/2016 1939   GLUCOSEU NEGATIVE 06/14/2016 1939   HGBUR NEGATIVE 06/14/2016 1939   BILIRUBINUR NEGATIVE 06/14/2016 1939   KETONESUR NEGATIVE 06/14/2016 1939   PROTEINUR 100 (A) 06/14/2016 1939   NITRITE NEGATIVE 06/14/2016 1939   LEUKOCYTESUR NEGATIVE 06/14/2016 1939   Sepsis Labs: Invalid input(s): PROCALCITONIN, LACTICIDVEN  Microbiology: Recent Results (from the past 240 hours)  MRSA Next Gen by PCR, Nasal     Status: None   Collection Time: 10/07/23  1:29 PM   Specimen: Nasal Mucosa; Nasal Swab  Result Value Ref Range Status   MRSA by PCR Next Gen NOT DETECTED NOT DETECTED Final    Comment: (NOTE) The GeneXpert MRSA Assay (FDA approved for NASAL specimens only), is one  component of a comprehensive MRSA colonization surveillance program. It is not intended to diagnose MRSA infection nor to guide or monitor treatment for MRSA infections. Test performance is not FDA approved in patients less than 64 years old. Performed at [facility], [address] [place], [place] [address]   Culture, Respiratory w Gram Stain     Status: None   Collection Time: 10/08/23  6:27 AM   Specimen: Tracheal Aspirate; Respiratory  Result Value Ref Range Status   Specimen Description TRACHEAL ASPIRATE  Final   Special Requests NONE  Final   Gram Stain   Final    FEW WBC PRESENT, PREDOMINANTLY PMN RARE GRAM POSITIVE COCCI    Culture   Final    Normal respiratory flora-no Staph aureus or Pseudomonas seen Performed at [facility], [address], [place], [place] [address]    Report Status 10/10/2023 FINAL  Final    Radiology Studies: DG Swallowing Func-Speech Pathology Result Date: 10/17/2023 Table formatting from the original result was not included. Modified Barium Swallow Study Patient Details Name: Nathan Campbell MRN: [id number] Date of Birth: Jan 08, 1976 Today's Date: 10/17/2023 HPI/PMH: HPI: Pt is a 48 y/o male who presented 10/07/23 with left-sided weakness and slurred speech. MRI brain 7/26: Large right MCA territory infarct with associated diffuse cortical thickening and some mass effect. Pt s/p TNK & IR revascularization. Intubated 7/25-7/29. 7/29 ileus. PMH: CHF, CM, ESRD on HD TTS, HTN, obesity, HLD Clinical Impression: Clinical Impression: Pt demonstrates a moderate oral dysphagia as well as late initiation of swallowing. Pt has severe left lingual weakness, tongue deviated left, pooled oral secretions spilling on the left and causing coughing prior to starting exam. Despite this pt able to achieve posterior transit of each texture with repetitive slow posterior lingual propulsion and minimal anterior spillage of PO. Able to use a straw. Pt does not form a bolus  and only mashes solids with tongue to palate, cannot manipulate for mastication. All boluses pool for several seconds in the pyriform sinsues prior to hyoid burst. Pt had only one instance of aspiration of thin liquids as swallow initiated. No sensation. Recommend initiating a puree diet and nectar thick liquids. Suspect pts secretion management may improve with oral intake and may be easily advance to thin liquids or improved solids. Pt  will need pills crushed in puree as he could not orally transit barium tablet. Factors that may increase risk of adverse event in presence of aspiration Noe & Lianne 2021): No data recorded Recommendations/Plan: Swallowing Evaluation Recommendations Swallowing Evaluation Recommendations Recommendations: PO diet PO Diet Recommendation: Dysphagia 1 (Pureed); Mildly thick liquids (Level 2, nectar thick) Liquid Administration via: Cup; Straw Medication Administration: Crushed with puree Supervision: Staff to assist with self-feeding Oral care recommendations: Use suctioning for oral care Caregiver Recommendations: Have oral suction available Treatment Plan Treatment Plan Treatment recommendations: Therapy as outlined in treatment plan below Follow-up recommendations: Acute inpatient rehab (3 hours/day) Treatment frequency: Min 2x/week Treatment duration: 2 weeks Interventions: Patient/family education; Trials of upgraded texture/liquids; Diet toleration management by SLP Recommendations Recommendations for follow up therapy are one component of a multi-disciplinary discharge planning process, led by the attending physician.  Recommendations may be updated based on patient status, additional functional criteria and insurance authorization. Assessment: Orofacial Exam: Orofacial Exam Oral Cavity: Oral Hygiene: Pooled secretions Oral Cavity - Dentition: Adequate natural dentition Orofacial Anatomy: WFL Oral Motor/Sensory Function: Suspected cranial nerve impairment CN V - Trigeminal:  Not tested CN VII - Facial: Left motor impairment CN IX - Glossopharyngeal, CN X - Vagus: Not tested CN XII - Hypoglossal: Left motor impairment Anatomy: Anatomy: WFL Boluses Administered: Boluses Administered Boluses Administered: Thin liquids (Level 0); Mildly thick liquids (Level 2, nectar thick); Puree; Solid  Oral Impairment Domain: Oral Impairment Domain Lip Closure: Interlabial escape, no progression to anterior lip Tongue control during bolus hold: Posterior escape of greater than half of bolus Bolus preparation/mastication: Minimal chewing/mashing with majority of bolus unchewed Bolus transport/lingual motion: Slow tongue motion Oral residue: Trace residue lining oral structures Location of oral residue : Lateral sulci Initiation of pharyngeal swallow : Pyriform sinuses  Pharyngeal Impairment Domain: Pharyngeal Impairment Domain Soft palate elevation: No bolus between soft palate (SP)/pharyngeal wall (PW) Laryngeal elevation: Complete superior movement of thyroid cartilage with complete approximation of arytenoids to epiglottic petiole Anterior hyoid excursion: Complete anterior movement Epiglottic movement: Complete inversion Laryngeal vestibule closure: Complete, no air/contrast in laryngeal vestibule Pharyngeal stripping wave : Present - complete Pharyngeal contraction (A/P view only): Complete Pharyngoesophageal segment opening: Complete distension and complete duration, no obstruction of flow Tongue base retraction: No contrast between tongue base and posterior pharyngeal wall (PPW) Pharyngeal residue: Complete pharyngeal clearance  Esophageal Impairment Domain: No data recorded Pill: No data recorded Penetration/Aspiration Scale Score: Penetration/Aspiration Scale Score 1.  Material does not enter airway: Mildly thick liquids (Level 2, nectar thick); Puree; Solid 8.  Material enters airway, passes BELOW cords without attempt by patient to eject out (silent aspiration) : Thin liquids (Level 0)  Compensatory Strategies: No data recorded  General Information: Caregiver present: No  Diet Prior to this Study: NPO; Cortrak/Small bore NG tube   No data recorded  No data recorded  Supplemental O2: None (Room air)   History of Recent Intubation: Yes  Behavior/Cognition: Alert; Cooperative Self-Feeding Abilities: Dependent for feeding Baseline vocal quality/speech: Abnormal resonance Volitional Cough: Able to elicit Volitional Swallow: Able to elicit No data recorded Goal Planning: Prognosis for improved oropharyngeal function: Good No data recorded No data recorded Patient/Family Stated Goal: none stated No data recorded Pain: Pain Assessment Pain Assessment: No/denies pain Pain Intervention(s): Monitored during session End of Session: Start Time:SLP Start Time (ACUTE ONLY): 1150 Stop Time: SLP Stop Time (ACUTE ONLY): 1212 Time Calculation:SLP Time Calculation (min) (ACUTE ONLY): 22 min Charges: SLP Evaluations $ SLP Speech Visit: 1 Visit  SLP Evaluations $BSS Swallow: 1 Procedure $MBS Swallow: 1 Procedure $Speech Treatment for Individual: 1 Procedure SLP visit diagnosis: SLP Visit Diagnosis: Dysphagia, oropharyngeal phase (R13.12) Past Medical History: Past Medical History: Diagnosis Date  Acute combined systolic and diastolic CHF, NYHA class 4 (HCC) 06/10/2016  Anemia   Cardiomyopathy (HCC) 06/14/2016  CHF (congestive heart failure) (HCC)   COVID 10/2020  Dyspnea   ESRD on hemodialysis (HCC)   TTS at [facility]  Hypertension   Hypertensive heart and kidney disease with heart failure (HCC) 06/14/2016  Hypertensive heart disease with congestive heart failure (HCC)   Obesity  Past Surgical History: Past Surgical History: Procedure Laterality Date  A/V FISTULAGRAM Left 05/03/2022  Procedure: A/V Fistulagram;  Surgeon: Eliza Lonni RAMAN, MD;  Location: [facility] INVASIVE CV LAB;  Service: Cardiovascular;  Laterality: Left;  AV FISTULA PLACEMENT Left 11/11/2020  Procedure: LEFT ARM First Stage Basilic Vein Fistula  Creation;  Surgeon: Sheree Penne Lonni, MD;  Location: [facility] OR;  Service: Vascular;  Laterality: Left;  BASCILIC VEIN TRANSPOSITION Left 01/23/2021  Procedure: Second Stage Basilic Vein Transposition of Left Arm Arteriovenous  Fistula;  Surgeon: Sheree Penne Lonni, MD;  Location: [facility] OR;  Service: Vascular;  Laterality: Left;  Block  to LMA   COLONOSCOPY    FISTULA SUPERFICIALIZATION Left 05/07/2022  Procedure: PLICATION OF ANEURYSM, LEFT ARM FISTULA;  Surgeon: Eliza Lonni RAMAN, MD;  Location: [facility] OR;  Service: Vascular;  Laterality: Left;  IR CT HEAD LTD  10/07/2023  IR CT HEAD LTD  10/07/2023  IR PATIENT EVAL TECH 0-60 MINS  10/07/2023  IR PERCUTANEOUS ART THROMBECTOMY/INFUSION INTRACRANIAL INC DIAG ANGIO  10/07/2023  NO PAST SURGERIES    PERIPHERAL VASCULAR BALLOON ANGIOPLASTY Left 05/03/2022  Procedure: PERIPHERAL VASCULAR BALLOON ANGIOPLASTY;  Surgeon: Eliza Lonni RAMAN, MD;  Location: [facility] INVASIVE CV LAB;  Service: Cardiovascular;  Laterality: Left;  AVF  RADIOLOGY WITH ANESTHESIA N/A 10/07/2023  Procedure: RADIOLOGY WITH ANESTHESIA;  Surgeon: Radiologist, Medication, MD;  Location: [facility] OR;  Service: Radiology;  Laterality: N/A; Consuelo Fort, MA CCC-SLP Acute Rehabilitation Services Secure Chat Preferred Office [phone] [place] 10/17/2023, 1:02 PM     Tracye Szuch T. Ezequias Lard [facility]  If 7PM-7AM, please contact night-coverage [email or web address] 10/17/2023, 1:15 PM

## 2023-10-17 NOTE — PMR Pre-admission (Signed)
 PMR Admission Coordinator Pre-Admission Assessment  Patient: Nathan Campbell is an 48 y.o., male MRN: [id number] DOB: 1975/06/07 Height: 6' (182.9 cm) Weight: 129.5 kg              Insurance Information HMO: yes    PPO:      PCP:      IPA:      80/20:      OTHER:  PRIMARY: U[employer] Medicare      Policy#: [id number]      Subscriber: pt CM Name: Deatrice      Phone#: [phone]     Fax#: [phone] Pre-Cert#: [id number] auth for CIR from [facility] with Home and [facility] with updates due to fax listed above on 8/11      Employer:  Benefits:  Phone #: [phone]     Name:  Eff. Date: 07/14/23     Deduct: $0      Out of Pocket Max: $3900 (met)      Life Max:   CIR: $430/day for days 1-4      SNF: 20 full days Outpatient:      Co-Pay: $20/visit Home Health: 80%      Co-Pay: 20% DME: 80%     Co-Pay: 20% Providers:  SECONDARY:       Policy#:       Phone#:   Artist:       Phone#:   The Engineer, materials Information Summary" for patients in Inpatient Rehabilitation Facilities with attached "Privacy Act Statement-Health Care Records" was provided and verbally reviewed with: Patient and Family  Emergency Contact Information Contact Information     Name Relation Home Work Mobile   Fitz,Victoria Mother [phone]   Chuong,quandra Sister   [phone]      Other Contacts   None on File    Current Medical History  Patient Admitting Diagnosis: CVA   History of Present Illness: Pt is a 48 y/o male with PMH of systolic and diastolic CHF, anemia, HTN, obesity, and ESRD on HD TTS who admitted to [facility] with acute onset of left sided weakness and neglect.  He is compliant with his dialysis and stated in his normal state of health at 5 AM with onset of symptoms at 630 AM.  On arrival to ED pt with NIHSS 13, SBP as high as 239, pt restless/agitated, and he ultimately had to be intubated for airway protection.  CT revealed R MCA ischemic changes with possible  calcified embolus in the proximal right m1 branch.  Pt did receive TNK without significant improvement.  Pt was taken to IR for angio and thrombectomy with successful revascularization.  Etiology of CVA felt to be large vessel disease given clacified plaque.  MRI confirmed large right MCA infarct with diffuse cortical thickening and some mild mass effect.  EF 50-55%, A1C 5.1.  Recommendations for aspirin  and plavix  x3 months and then aspiring alone.  He was extubated and is on room air.    Complete NIHSS TOTAL: 10 Glasgow Coma Scale Score: 15  Patient's medical record from [facility] has been reviewed by the rehabilitation admission coordinator and physician.  Past Medical History  Past Medical History:  Diagnosis Date   Acute combined systolic and diastolic CHF, NYHA class 4 (HCC) 06/10/2016   Anemia    Cardiomyopathy (HCC) 06/14/2016   CHF (congestive heart failure) (HCC)    COVID 10/2020   Dyspnea    ESRD on hemodialysis (HCC)    TTS at [facility]  Hypertension    Hypertensive heart and kidney disease with heart failure (HCC) 06/14/2016   Hypertensive heart disease with congestive heart failure (HCC)    Obesity     Has the patient had major surgery during 100 days prior to admission? Yes  Family History  family history includes Colon cancer in his cousin; Diabetes in his father; Diabetes Mellitus II in his paternal grandfather; Heart disease in his cousin and cousin; Hypertension in his father, mother, paternal grandmother, and sister.   Current Medications   Current Facility-Administered Medications:    acetaminophen  (TYLENOL ) tablet 650 mg, 650 mg, Oral, Q4H PRN, 650 mg at 10/15/23 1035 **OR** acetaminophen  (TYLENOL ) 160 MG/5ML solution 650 mg, 650 mg, Per Tube, Q4H PRN, 650 mg at 10/18/23 0142 **OR** acetaminophen  (TYLENOL ) suppository 650 mg, 650 mg, Rectal, Q4H PRN, Deveshwar, Sanjeev, MD   alteplase  (CATHFLO ACTIVASE ) injection 2 mg, 2 mg, Intracatheter, Once PRN,  Fritz Belvie DEL, NP   amLODipine  (NORVASC ) tablet 10 mg, 10 mg, Per Tube, Daily, Alva, Rakesh V, MD, 10 mg at 10/17/23 1108   anticoagulant sodium citrate  solution 5 mL, 5 mL, Intracatheter, PRN, Fritz Belvie DEL, NP   [DISCONTINUED] aspirin  suppository 300 mg, 300 mg, Rectal, Daily **OR** aspirin  chewable tablet 81 mg, 81 mg, Per Tube, Daily, Shafer, Devon, NP, 81 mg at 10/17/23 1108   atorvastatin  (LIPITOR) tablet 40 mg, 40 mg, Per Tube, Daily, Jerri Pfeiffer, MD, 40 mg at 10/17/23 1110   bisacodyl  (DULCOLAX) EC tablet 5 mg, 5 mg, Oral, Daily PRN, Alva, Rakesh V, MD   carvedilol  (COREG ) tablet 9.375 mg, 9.375 mg, Per Tube, BID, Gonfa, Taye T, MD, 9.375 mg at 10/17/23 2200   Chlorhexidine  Gluconate Cloth 2 % PADS 6 each, 6 each, Topical, Q0600, Lenon Charmaine BRAVO, NP, 6 each at 10/18/23 9442   clopidogrel  (PLAVIX ) tablet 75 mg, 75 mg, Per Tube, Daily, Jerri Pfeiffer, MD, 75 mg at 10/17/23 1109   cyclobenzaprine  (FLEXERIL ) tablet 5 mg, 5 mg, Per Tube, TID PRN, Jerri Pfeiffer, MD, 5 mg at 10/18/23 0142   docusate (COLACE) 50 MG/5ML liquid 100 mg, 100 mg, Per Tube, BID, Alva, Rakesh V, MD, 100 mg at 10/17/23 2200   feeding supplement (OSMOLITE 1.5 CAL) liquid 1,000 mL, 1,000 mL, Per Tube, Continuous, Xu, Jindong, MD, Last Rate: 35 mL/hr at 10/17/23 2205, 1,000 mL at 10/17/23 2205   feeding supplement (PROSource TF20) liquid 60 mL, 60 mL, Per Tube, Daily, Autry, Lauren E, PA-C, 60 mL at 10/17/23 1109   free water  30 mL, 30 mL, Per Tube, Q6H, Jerri Pfeiffer, MD, 30 mL at 10/18/23 0558   heparin  bolus via infusion 1,500 Units, 1,500 Units, Intravenous, Once, Geralynn Charleston, MD   heparin  injection 1,000 Units, 1,000 Units, Intracatheter, PRN, Fritz Belvie DEL, NP   heparin  injection 2,000 Units, 2,000 Units, Dialysis, PRN, Fritz Belvie DEL, NP   heparin  injection 4,000 Units, 4,000 Units, Dialysis, PRN, Fritz Belvie DEL, NP, 4,000 Units at 10/18/23 0948   heparin  injection 5,000 Units, 5,000  Units, Subcutaneous, Q8H, Sethi, Pramod S, MD, 5,000 Units at 10/18/23 0557   insulin  aspart (novoLOG ) injection 0-6 Units, 0-6 Units, Subcutaneous, Q4H, Autry, Lauren E, PA-C, 1 Units at 10/11/23 0403   isosorbide  dinitrate (ISORDIL ) tablet 5 mg, 5 mg, Per Tube, BID, Alva, Rakesh V, MD, 5 mg at 10/17/23 2205   labetalol  (NORMODYNE ) injection 10-20 mg, 10-20 mg, Intravenous, Q2H PRN, Alva, Rakesh V, MD, 20 mg at 10/13/23 0146   lanthanum  (FOSRENOL ) chewable tablet 1,000 mg,  1,000 mg, Per Tube, TID WC, Stovall, Kathryn R, PA-C, 1,000 mg at 10/17/23 8165   lidocaine  (PF) (XYLOCAINE ) 1 % injection 5 mL, 5 mL, Intradermal, PRN, Fritz Belvie DEL, NP   lidocaine -prilocaine  (EMLA ) cream 1 Application, 1 Application, Topical, PRN, Fritz Belvie DEL, NP   ondansetron  (ZOFRAN ) injection 4 mg, 4 mg, Intravenous, Q6H PRN, Adolph, Lauren E, PA-C, 4 mg at 10/13/23 1741   Oral care mouth rinse, 15 mL, Mouth Rinse, 4 times per day, Michaela Aisha SQUIBB, MD, 15 mL at 10/17/23 2201   Oral care mouth rinse, 15 mL, Mouth Rinse, PRN, Michaela Aisha SQUIBB, MD   pantoprazole  (PROTONIX ) injection 40 mg, 40 mg, Intravenous, QHS, de La Torre, Berwyn E, NP, 40 mg at 10/17/23 2200   pentafluoroprop-tetrafluoroeth (GEBAUERS) aerosol 1 Application, 1 Application, Topical, PRN, Fritz Belvie DEL, NP  Patients Current Diet:  Diet Order             DIET - DYS 1 Room service appropriate? No; Fluid consistency: Nectar Thick  Diet effective now                   Precautions / Restrictions Precautions Precautions: Fall Precaution/Restrictions Comments: cortrak, watch SpO2 and RR, left inattention Restrictions Weight Bearing Restrictions Per Provider Order: No   Has the patient had 2 or more falls or a fall with injury in the past year?No  Prior Activity Level Community (5-7x/wk): independent, driving, no DME  Prior Functional Level Prior Function Prior Level of Function : Independent/Modified  Independent, Driving Mobility Comments: no use of DME ADLs Comments: independent, driving himself to HD  Self Care: Did the patient need help bathing, dressing, using the toilet or eating?  Independent  Indoor Mobility: Did the patient need assistance with walking from room to room (with or without device)? Independent  Stairs: Did the patient need assistance with internal or external stairs (with or without device)? Independent  Functional Cognition: Did the patient need help planning regular tasks such as shopping or remembering to take medications? Independent  Patient Information Are you of Hispanic, Latino/a,or Spanish origin?: A. No, not of Hispanic, Latino/a, or Spanish origin What is your race?: B. Black or African American Do you need or want an interpreter to communicate with a doctor or health care staff?: 0. No  Patient's Response To:  Health Literacy and Transportation Is the patient able to respond to health literacy and transportation needs?: Yes Health Literacy - How often do you need to have someone help you when you read instructions, pamphlets, or other written material from your doctor or pharmacy?: Never In the past 12 months, has lack of transportation kept you from medical appointments or from getting medications?: No In the past 12 months, has lack of transportation kept you from meetings, work, or from getting things needed for daily living?: No  Home Assistive Devices / Equipment Home Equipment: None  Prior Device Use: Indicate devices/aids used by the patient prior to current illness, exacerbation or injury? None of the above  Current Functional Level Cognition  Arousal/Alertness: Awake/alert Overall Cognitive Status: Impaired/Different from baseline Orientation Level: Oriented X4 Attention: Focused, Sustained Focused Attention: Appears intact Sustained Attention: Impaired Sustained Attention Impairment: Verbal complex Memory: Impaired Memory  Impairment: Retrieval deficit, Storage deficit, Decreased recall of new information (Immediate: 4/5 with repetition x3; delayed: 1/5; 4/5 with cues) Awareness: Impaired Awareness Impairment: Intellectual impairment Problem Solving: Impaired Problem Solving Impairment: Verbal complex Executive Function: Sequencing, Organizing, Reasoning Reasoning: Impaired Reasoning Impairment: Verbal complex  Sequencing: Impaired Sequencing Impairment: Verbal complex (clock drawing: 0/4) Organizing: Impaired Organizing Impairment: Verbal complex    Extremity Assessment (includes Sensation/Coordination)  Upper Extremity Assessment: Right hand dominant, LUE deficits/detail LUE Deficits / Details: PROM WFL, impaired sensation and proprioception.  Pt showing some AROM, but limited to shoulder elevation, retraction and shoulder f/e (extension more than flexion)- worked on [employer] with push/pull. LUE Sensation: decreased light touch, decreased proprioception LUE Coordination: decreased fine motor, decreased gross motor  Lower Extremity Assessment: Defer to PT evaluation LLE Deficits / Details: grossly 4-/5 to MMT, pt reports sensation intact LLE Sensation: WNL LLE Coordination: decreased fine motor, decreased gross motor    ADLs  Overall ADL's : Needs assistance/impaired Eating/Feeding: NPO Grooming: Sitting, Oral care, Wash/dry face, Moderate assistance Grooming Details (indicate cue type and reason): needs assist for bimanual tasks, min assist for safety with oral care using suction toothbrush Upper Body Bathing: Maximal assistance, Sitting Lower Body Bathing: Maximal assistance, Bed level Upper Body Dressing : Moderate assistance, Sitting Lower Body Dressing: Maximal assistance, Sit to/from stand, Bed level Lower Body Dressing Details (indicate cue type and reason): assist to initate donning socks over toes but then pt able to manage over feet from bed level, needs R UE support in standing Toilet  Transfer: Minimal assistance, Ambulation Toilet Transfer Details (indicate cue type and reason): quad cane, +2 safety with PT Functional mobility during ADLs: Minimal assistance, Cane (quad cane)    Mobility  Overal bed mobility: Needs Assistance Bed Mobility: Rolling, Sidelying to Sit Rolling: Contact guard assist Sidelying to sit: Contact guard assist, HOB elevated Supine to sit: Total assist, HOB elevated Sit to sidelying: Mod assist, +2 for safety/equipment General bed mobility comments: Pt sitting EOB with OT at beginning of session    Transfers  Overall transfer level: Needs assistance Equipment used: Quad cane Transfers: Sit to/from Stand Sit to Stand: Min assist, +2 safety/equipment Bed to/from chair/wheelchair/BSC transfer type:: Step pivot Step pivot transfers: Mod assist General transfer comment: STS from EOB and recliner with cues for hand placement and min A for power up    Ambulation / Gait / Stairs / Wheelchair Mobility  Ambulation/Gait Ambulation/Gait assistance: Min assist, +2 safety/equipment Gait Distance (Feet): 15 Feet (+10) Assistive device: Quad cane Gait Pattern/deviations: Trunk flexed, Step-through pattern, Narrow base of support, Step-to pattern, Decreased stride length, Decreased step length - left General Gait Details: Pt demonstrates short L step length and flexed trunk requiring cues for increased step length and upright posture. Dense cues for sequencing with quad cane with pt demonstrating slightly improved recall with increased time for processing at end of trial. Cues for slowed pacing for safety due to increased instability. Chair follow for safety with pt requiring 1 seated rest break Gait velocity interpretation: <1.8 ft/sec, indicate of risk for recurrent falls    Posture / Balance Dynamic Sitting Balance Sitting balance - Comments: sitting EOB Balance Overall balance assessment: Needs assistance Sitting-balance support: Single extremity  supported, No upper extremity supported Sitting balance-Leahy Scale: Fair Sitting balance - Comments: sitting EOB Postural control: Posterior lean, Left lateral lean Standing balance support: Single extremity supported, During functional activity, Reliant on assistive device for balance Standing balance-Leahy Scale: Poor Standing balance comment: with quad cane and min A    Special considerations/ Life events CPAP, Dialysis: Hemodialysis Tuesday, Thursday, and Saturday, Diabetic management A1C 5.1, and Special service needs cortrak     Previous Home Environment (from acute therapy documentation) Living Arrangements: Alone  Lives With:  (unknown) Available Help  at Discharge:  (unknown) Type of Home: Apartment Home Layout: One level Home Access: Stairs to enter Entrance Stairs-Rails: Right, Left Entrance Stairs-Number of Steps: 1 flight  Discharge Living Setting Plans for Discharge Living Setting: Patient's home (mom to stay with him) Type of Home at Discharge: Apartment Discharge Home Layout: One level Discharge Home Access: Stairs to enter Entrance Stairs-Rails: Right, Left Entrance Stairs-Number of Steps: full flight Discharge Bathroom Shower/Tub: Tub/shower unit Discharge Bathroom Toilet: Standard Discharge Bathroom Accessibility: Yes How Accessible: Accessible via walker Does the patient have any problems obtaining your medications?: No  Social/Family/Support Systems Anticipated Caregiver: mom, Kasean Denherder Anticipated Caregiver's Contact Information: [phone] Ability/Limitations of Caregiver: none stated, she is retired Medical laboratory scientific officer: 24/7 Discharge Plan Discussed with Primary Caregiver: Yes Is Caregiver In Agreement with Plan?: Yes Does Caregiver/Family have Issues with Lodging/Transportation while Pt is in Rehab?: No   Goals Patient/Family Goal for Rehab: PT/OT/SLP supervision to mod I Expected length of stay: 9-12 days Additional Information: HD  TTS; Discharge plan: return to pt's apartment, mom can provide oversight and assist with modified diet if needed Pt/Family Agrees to Admission and willing to participate: Yes Program Orientation Provided & Reviewed with Pt/Caregiver Including Roles  & Responsibilities: Yes   Decrease burden of Care through IP rehab admission: n/a   Possible need for SNF placement upon discharge: Not anticipated.  Mom is a retired Engineer, civil (consulting) and can provide expected level of care at discharge.    Patient Condition: This patient's medical and functional status has changed since the consult dated: 10/14/23 in which the Rehabilitation Physician determined and documented that the patient's condition is appropriate for intensive rehabilitative care in an inpatient rehabilitation facility. See History of Present Illness (above) for medical update. Functional changes are: min assist with mobility and ADLs, advanced to D1/nectar diet. Patient's medical and functional status update has been discussed with the Rehabilitation physician and patient remains appropriate for inpatient rehabilitation. Will admit to inpatient rehab today.  Preadmission Screen Completed By:  Reche FORBES Lowers, PT, DPT 10/18/2023 11:19 AM ______________________________________________________________________   Discussed status with Dr. Urbano on 10/18/23 at 11:19 AM  and received approval for admission today.  Admission Coordinator:  Caitlin E Warren,PT, DPT time 11:19 AM Pattricia 10/18/23

## 2023-10-17 NOTE — Progress Notes (Signed)
 Physical Therapy Treatment Patient Details Name: Nathan Campbell MRN: [id number] DOB: 02-17-1976 Today's Date: 10/17/2023   History of Present Illness 48 yo male adm 10/07/23 with Lt-sided weakness and slurred speech with Rt MCA CVA sp TNK and IR revascularization. Intubated 7/25-7/29. 7/29 ileus. PMH: CHF, CM, ESRD on HD TTS, HTN, obesity, HLD    PT Comments  Pt received sitting EOB and agreeable to session. Pt able to tolerate gait trial this session with quad cane support. Pt requires min A for balance and dense cues for sequencing with pt requiring increased time for processing and carryover. Pt able to demonstrate improved carryover at end of session, however requires increased time and energy limiting gait distance. Pt requires 1 seated rest break due to fatigue and is noted to drift off to sleep. Pt noted to desat to 88% on RA during ambulation, but quickly recovers to >90% with pursed lip breathing. Pt continues to benefit from PT services to progress toward functional mobility goals.    If plan is discharge home, recommend the following: Two people to help with bathing/dressing/bathroom;Assistance with cooking/housework;Assistance with feeding;Assist for transportation;Help with stairs or ramp for entrance;A lot of help with walking and/or transfers   Can travel by private vehicle        Equipment Recommendations  Other (comment) (quad cane)    Recommendations for Other Services       Precautions / Restrictions Precautions Precautions: Fall Recall of Precautions/Restrictions: Impaired Precaution/Restrictions Comments: cortrak, watch SpO2 and RR, left inattention Restrictions Weight Bearing Restrictions Per Provider Order: No     Mobility  Bed Mobility               General bed mobility comments: Pt sitting EOB with OT at beginning of session    Transfers Overall transfer level: Needs assistance Equipment used: Quad cane Transfers: Sit to/from Stand Sit to Stand:  Min assist, +2 safety/equipment           General transfer comment: STS from EOB and recliner with cues for hand placement and min A for power up    Ambulation/Gait Ambulation/Gait assistance: Min assist, +2 safety/equipment Gait Distance (Feet): 15 Feet (+10) Assistive device: Quad cane Gait Pattern/deviations: Trunk flexed, Step-through pattern, Narrow base of support, Step-to pattern, Decreased stride length, Decreased step length - left       General Gait Details: Pt demonstrates short L step length and flexed trunk requiring cues for increased step length and upright posture. Dense cues for sequencing with quad cane with pt demonstrating slightly improved recall with increased time for processing at end of trial. Cues for slowed pacing for safety due to increased instability. Chair follow for safety with pt requiring 1 seated rest break   Stairs             Wheelchair Mobility     Tilt Bed    Modified Rankin (Stroke Patients Only) Modified Rankin (Stroke Patients Only) Pre-Morbid Rankin Score: No symptoms Modified Rankin: Moderately severe disability     Balance Overall balance assessment: Needs assistance Sitting-balance support: Single extremity supported, No upper extremity supported Sitting balance-Leahy Scale: Fair Sitting balance - Comments: sitting EOB   Standing balance support: Single extremity supported, During functional activity, Reliant on assistive device for balance Standing balance-Leahy Scale: Poor Standing balance comment: with quad cane and min A                            Communication Communication Communication:  Impaired Factors Affecting Communication: Difficulty expressing self  Cognition Arousal: Alert, Lethargic Behavior During Therapy: Flat affect   PT - Cognitive impairments: Difficult to assess Difficult to assess due to: Impaired communication                       Following commands:  Impaired Following commands impaired: Only follows one step commands consistently, Follows one step commands with increased time    Cueing Cueing Techniques: Verbal cues, Tactile cues  Exercises      General Comments General comments (skin integrity, edema, etc.): SpO2 94% on RA sitting EOB and 88-92% on RA during ambulation. A few desats to 88%, but quick recovery to >90% with cues for pursed lip breathing      Pertinent Vitals/Pain Pain Assessment Pain Assessment: No/denies pain     PT Goals (current goals can now be found in the care plan section) Acute Rehab PT Goals Patient Stated Goal: unable to state PT Goal Formulation: With patient Time For Goal Achievement: 10/23/23 Progress towards PT goals: Progressing toward goals    Frequency    Min 3X/week       AM-PAC PT 6 Clicks Mobility   Outcome Measure  Help needed turning from your back to your side while in a flat bed without using bedrails?: A Little Help needed moving from lying on your back to sitting on the side of a flat bed without using bedrails?: A Little Help needed moving to and from a bed to a chair (including a wheelchair)?: A Little Help needed standing up from a chair using your arms (e.g., wheelchair or bedside chair)?: A Little Help needed to walk in hospital room?: A Lot Help needed climbing 3-5 steps with a railing? : Total 6 Click Score: 15    End of Session Equipment Utilized During Treatment: Gait belt Activity Tolerance: Patient tolerated treatment well Patient left: with call bell/phone within reach;in chair;with chair alarm set Nurse Communication: Mobility status PT Visit Diagnosis: Unsteadiness on feet (R26.81);Other abnormalities of gait and mobility (R26.89);Muscle weakness (generalized) (M62.81);Hemiplegia and hemiparesis Hemiplegia - Right/Left: Left Hemiplegia - dominant/non-dominant: Non-dominant Hemiplegia - caused by: Cerebral infarction     Time: 0950-1017 PT Time  Calculation (min) (ACUTE ONLY): 27 min  Charges:    $Gait Training: 23-37 mins PT General Charges $$ ACUTE PT VISIT: 1 Visit                    Darryle George, PTA Acute Rehabilitation Services Secure Chat Preferred  [phone]    Darryle George 10/17/2023, 10:48 AM

## 2023-10-17 NOTE — TOC Progression Note (Addendum)
 Transition of Care [facility]) - Progression Note    Patient Details  Name: Nathan Campbell MRN: [id number] Date of Birth: 1975/06/11  Transition of Care [facility]) CM/SW Contact  Andrez JULIANNA George, RN Phone Number: 10/17/2023, 2:41 PM  Clinical Narrative:     CIR starting insurance for rehab admission. Pt still with cortrak but passed for a diet today. ESRD IP Care Management following.   Expected Discharge Plan: IP Rehab Facility Barriers to Discharge: Continued Medical Work up, English as a second language teacher               Expected Discharge Plan and Services     Post Acute Care Choice: IP Rehab Living arrangements for the past 2 months: Single Family Home                                       Social Drivers of Health (SDOH) Interventions SDOH Screenings   Food Insecurity: No Food Insecurity (03/26/2022)  Housing: Low Risk  (03/26/2022)  Transportation Needs: No Transportation Needs (03/26/2022)  Utilities: Not At Risk (03/26/2022)  Tobacco Use: High Risk (10/07/2023)    Readmission Risk Interventions     No data to display

## 2023-10-17 NOTE — Progress Notes (Signed)
 [facility] Progress Note   Subjective: Patient seen in his room today. He was working with PT earlier, but I came back in the afternoon. He denies any CP or dyspnea. At his last HD, we were able to yield 1.1L off. He is below his OP EDW.  Next HD 10/18/23  Objective Vitals:   10/17/23 0322 10/17/23 0413 10/17/23 0748 10/17/23 1150  BP: (!) 171/99  (!) 157/79 124/74  Pulse: 94  88 99  Resp: 18  17 (!) 30  Temp: 98.9 F (37.2 C)  97.6 F (36.4 C) 97.7 F (36.5 C)  TempSrc: Oral  Oral Oral  SpO2: 97%  95% 97%  Weight:  128.7 kg    Height:       Physical Exam General: Lying in bed, no distress, obese Heart: Normal rate, no rub Lungs: Bilateral chest rise with no increased work of breathing Abdomen: soft, distended Extremities: Trace edema in lower extremities, warm and well-perfused Dialysis Access: AVF +t/b  Additional Objective Labs: Basic Metabolic Panel: Recent Labs  Lab 10/13/23 0552 10/14/23 0618 10/15/23 0321 10/17/23 0117  NA 142 140 140 136  K 4.3 4.3 4.5 4.3  CL 95* 91* 92* 92*  CO2 21* 25 24 24   GLUCOSE 116* 114* 123* 121*  BUN 87* 72* 105* 103*  CREATININE 15.18* 13.26* 16.04* 14.89*  CALCIUM  10.5* 10.8* 10.3 10.0  PHOS 11.4*  --   --   --      CBC: Recent Labs  Lab 10/12/23 0605 10/12/23 1426 10/13/23 0552 10/14/23 0618 10/15/23 0321 10/17/23 0117  WBC 13.6*  --  15.8* 16.2* 16.0* 12.3*  HGB 10.5*   < > 10.9* 11.9* 10.7* 11.6*  HCT 32.1*   < > 33.8* 38.2* 33.9* 36.0*  MCV 99.4  --  99.4 100.5* 101.2* 99.7  PLT 261  --  301 350 358 363   < > = values in this interval not displayed.   Blood Culture    Component Value Date/Time   SDES TRACHEAL ASPIRATE 10/08/2023 0627   SPECREQUEST NONE 10/08/2023 0627   CULT  10/08/2023 0627    Normal respiratory flora-no Staph aureus or Pseudomonas seen Performed at [facility], [address] [place], [place] [address]    REPTSTATUS 10/10/2023 FINAL 10/08/2023 9372    Medications:  feeding supplement (OSMOLITE 1.5 CAL) 1,000 mL (10/17/23 0415)    amLODipine   10 mg Per Tube Daily   aspirin   81 mg Per Tube Daily   atorvastatin   40 mg Per Tube Daily   carvedilol   9.375 mg Per Tube BID   Chlorhexidine  Gluconate Cloth  6 each Topical Q0600   clopidogrel   75 mg Per Tube Daily   docusate  100 mg Per Tube BID   feeding supplement (PROSource TF20)  60 mL Per Tube Daily   free water   30 mL Per Tube Q6H   heparin   1,500 Units Intravenous Once   heparin  injection (subcutaneous)  5,000 Units Subcutaneous Q8H   insulin  aspart  0-6 Units Subcutaneous Q4H   isosorbide  dinitrate  5 mg Per Tube BID   lanthanum   1,000 mg Per Tube TID WC   mouth rinse  15 mL Mouth Rinse 4 times per day   pantoprazole  (PROTONIX ) IV  40 mg Intravenous QHS    Dialysis Orders TTS - AF 4:15hr, 500/600, EDW 138kg, 2K/2C bath, AVF, heparin  4000+ - Mircera 50mcg given 10/06/23 - Hectoral 7mcg IV q HD - Takes Auryxia  3/meals and Xphozah  30mg  BID  Assessment/Plan: Acute R MCA CVA: S/p TNK 7/25. Required intubation, now on Nicut O2. Ongoing L sided weakness. Per neuro. Working with PT currently.  AHRF, ?aspiration + CVA: Now extubated, on Heil O2. S/p Unasyn  course. ESRD: Continue HD on TTS schedule  HTN/volume: BP decent, occ high - UF as tolerated, keep lowering EDW. Anemia of ESRD: Hgb ~11.6 today, no ESA for now. Secondary HPTH: Ca and Phos very high, VDRA on hold. Home auryxia  can not be given via NG tube - change to fosrenol  for now. Nutrition: Alb low, getting TF + protein supplements. Per RD - unable to do Nepro at this point, will change in future.  Belvie Och, NP 10/17/2023, 3:26 PM  [facility]

## 2023-10-18 ENCOUNTER — Inpatient Hospital Stay (HOSPITAL_COMMUNITY)

## 2023-10-18 ENCOUNTER — Inpatient Hospital Stay (HOSPITAL_COMMUNITY)
Admission: AD | Admit: 2023-10-18 | Discharge: 2023-11-04 | DRG: 056 | Disposition: A | Discharge status: Home Health | Source: Intra-hospital | Attending: Physical Medicine & Rehabilitation | Admitting: Physical Medicine & Rehabilitation

## 2023-10-18 ENCOUNTER — Encounter (HOSPITAL_COMMUNITY): Payer: Self-pay

## 2023-10-18 ENCOUNTER — Other Ambulatory Visit (HOSPITAL_COMMUNITY): Payer: Self-pay

## 2023-10-18 DIAGNOSIS — N2581 Secondary hyperparathyroidism of renal origin: Secondary | ICD-10-CM | POA: Diagnosis present

## 2023-10-18 DIAGNOSIS — F09 Unspecified mental disorder due to known physiological condition: Secondary | ICD-10-CM | POA: Diagnosis not present

## 2023-10-18 DIAGNOSIS — E66812 Obesity, class 2: Secondary | ICD-10-CM

## 2023-10-18 DIAGNOSIS — Z79899 Other long term (current) drug therapy: Secondary | ICD-10-CM | POA: Diagnosis not present

## 2023-10-18 DIAGNOSIS — Z8249 Family history of ischemic heart disease and other diseases of the circulatory system: Secondary | ICD-10-CM | POA: Diagnosis not present

## 2023-10-18 DIAGNOSIS — R29810 Facial weakness: Secondary | ICD-10-CM | POA: Diagnosis present

## 2023-10-18 DIAGNOSIS — E785 Hyperlipidemia, unspecified: Secondary | ICD-10-CM | POA: Diagnosis present

## 2023-10-18 DIAGNOSIS — D631 Anemia in chronic kidney disease: Secondary | ICD-10-CM | POA: Diagnosis present

## 2023-10-18 DIAGNOSIS — I5042 Chronic combined systolic (congestive) and diastolic (congestive) heart failure: Secondary | ICD-10-CM | POA: Diagnosis present

## 2023-10-18 DIAGNOSIS — I63411 Cerebral infarction due to embolism of right middle cerebral artery: Secondary | ICD-10-CM

## 2023-10-18 DIAGNOSIS — I10 Essential (primary) hypertension: Secondary | ICD-10-CM | POA: Diagnosis not present

## 2023-10-18 DIAGNOSIS — L299 Pruritus, unspecified: Secondary | ICD-10-CM | POA: Diagnosis present

## 2023-10-18 DIAGNOSIS — E669 Obesity, unspecified: Secondary | ICD-10-CM | POA: Diagnosis present

## 2023-10-18 DIAGNOSIS — J9601 Acute respiratory failure with hypoxia: Secondary | ICD-10-CM | POA: Diagnosis present

## 2023-10-18 DIAGNOSIS — K5900 Constipation, unspecified: Secondary | ICD-10-CM | POA: Diagnosis present

## 2023-10-18 DIAGNOSIS — I639 Cerebral infarction, unspecified: Secondary | ICD-10-CM | POA: Diagnosis present

## 2023-10-18 DIAGNOSIS — Z8616 Personal history of COVID-19: Secondary | ICD-10-CM | POA: Diagnosis not present

## 2023-10-18 DIAGNOSIS — J9602 Acute respiratory failure with hypercapnia: Secondary | ICD-10-CM | POA: Diagnosis present

## 2023-10-18 DIAGNOSIS — I63511 Cerebral infarction due to unspecified occlusion or stenosis of right middle cerebral artery: Secondary | ICD-10-CM

## 2023-10-18 DIAGNOSIS — F17210 Nicotine dependence, cigarettes, uncomplicated: Secondary | ICD-10-CM | POA: Diagnosis present

## 2023-10-18 DIAGNOSIS — I69392 Facial weakness following cerebral infarction: Secondary | ICD-10-CM | POA: Diagnosis not present

## 2023-10-18 DIAGNOSIS — I69320 Aphasia following cerebral infarction: Secondary | ICD-10-CM | POA: Diagnosis not present

## 2023-10-18 DIAGNOSIS — I951 Orthostatic hypotension: Secondary | ICD-10-CM | POA: Diagnosis not present

## 2023-10-18 DIAGNOSIS — Z800 Family history of malignant neoplasm of digestive organs: Secondary | ICD-10-CM | POA: Diagnosis not present

## 2023-10-18 DIAGNOSIS — E875 Hyperkalemia: Secondary | ICD-10-CM | POA: Diagnosis present

## 2023-10-18 DIAGNOSIS — I6601 Occlusion and stenosis of right middle cerebral artery: Secondary | ICD-10-CM | POA: Diagnosis present

## 2023-10-18 DIAGNOSIS — E1122 Type 2 diabetes mellitus with diabetic chronic kidney disease: Secondary | ICD-10-CM | POA: Diagnosis present

## 2023-10-18 DIAGNOSIS — N186 End stage renal disease: Secondary | ICD-10-CM | POA: Diagnosis present

## 2023-10-18 DIAGNOSIS — D72829 Elevated white blood cell count, unspecified: Secondary | ICD-10-CM | POA: Diagnosis present

## 2023-10-18 DIAGNOSIS — R1310 Dysphagia, unspecified: Secondary | ICD-10-CM | POA: Diagnosis present

## 2023-10-18 DIAGNOSIS — Z7982 Long term (current) use of aspirin: Secondary | ICD-10-CM

## 2023-10-18 DIAGNOSIS — I132 Hypertensive heart and chronic kidney disease with heart failure and with stage 5 chronic kidney disease, or end stage renal disease: Secondary | ICD-10-CM | POA: Diagnosis present

## 2023-10-18 DIAGNOSIS — I69354 Hemiplegia and hemiparesis following cerebral infarction affecting left non-dominant side: Principal | ICD-10-CM

## 2023-10-18 DIAGNOSIS — Z833 Family history of diabetes mellitus: Secondary | ICD-10-CM | POA: Diagnosis not present

## 2023-10-18 DIAGNOSIS — I953 Hypotension of hemodialysis: Secondary | ICD-10-CM | POA: Diagnosis not present

## 2023-10-18 DIAGNOSIS — I429 Cardiomyopathy, unspecified: Secondary | ICD-10-CM | POA: Diagnosis present

## 2023-10-18 DIAGNOSIS — R5381 Other malaise: Secondary | ICD-10-CM | POA: Diagnosis present

## 2023-10-18 DIAGNOSIS — R1312 Dysphagia, oropharyngeal phase: Secondary | ICD-10-CM | POA: Diagnosis not present

## 2023-10-18 DIAGNOSIS — Z6839 Body mass index (BMI) 39.0-39.9, adult: Secondary | ICD-10-CM

## 2023-10-18 DIAGNOSIS — Z992 Dependence on renal dialysis: Secondary | ICD-10-CM

## 2023-10-18 DIAGNOSIS — K5901 Slow transit constipation: Secondary | ICD-10-CM | POA: Diagnosis present

## 2023-10-18 LAB — GLUCOSE, CAPILLARY
Glucose-Capillary: 105 mg/dL — ABNORMAL HIGH (ref 70–99)
Glucose-Capillary: 107 mg/dL — ABNORMAL HIGH (ref 70–99)
Glucose-Capillary: 111 mg/dL — ABNORMAL HIGH (ref 70–99)
Glucose-Capillary: 121 mg/dL — ABNORMAL HIGH (ref 70–99)
Glucose-Capillary: 127 mg/dL — ABNORMAL HIGH (ref 70–99)
Glucose-Capillary: 140 mg/dL — ABNORMAL HIGH (ref 70–99)

## 2023-10-18 LAB — CBC
HCT: 33.9 % — ABNORMAL LOW (ref 39.0–52.0)
Hemoglobin: 10.9 g/dL — ABNORMAL LOW (ref 13.0–17.0)
MCH: 31.7 pg (ref 26.0–34.0)
MCHC: 32.2 g/dL (ref 30.0–36.0)
MCV: 98.5 fL (ref 80.0–100.0)
Platelets: 384 K/uL (ref 150–400)
RBC: 3.44 MIL/uL — ABNORMAL LOW (ref 4.22–5.81)
RDW: 13.1 % (ref 11.5–15.5)
WBC: 11.2 K/uL — ABNORMAL HIGH (ref 4.0–10.5)
nRBC: 0 % (ref 0.0–0.2)

## 2023-10-18 LAB — RENAL FUNCTION PANEL
Albumin: 3.3 g/dL — ABNORMAL LOW (ref 3.5–5.0)
Anion gap: 23 — ABNORMAL HIGH (ref 5–15)
BUN: 139 mg/dL — ABNORMAL HIGH (ref 6–20)
CO2: 23 mmol/L (ref 22–32)
Calcium: 9.8 mg/dL (ref 8.9–10.3)
Chloride: 89 mmol/L — ABNORMAL LOW (ref 98–111)
Creatinine, Ser: 18.98 mg/dL — ABNORMAL HIGH (ref 0.61–1.24)
GFR, Estimated: 3 mL/min — ABNORMAL LOW (ref >60)
Glucose, Bld: 103 mg/dL — ABNORMAL HIGH (ref 70–99)
Phosphorus: >30 mg/dL — ABNORMAL HIGH (ref 2.5–4.6)
Potassium: 5.8 mmol/L — ABNORMAL HIGH (ref 3.5–5.1)
Sodium: 135 mmol/L (ref 135–145)

## 2023-10-18 MED ORDER — HEPARIN SODIUM (PORCINE) 5000 UNIT/ML IJ SOLN
5000.0000 [IU] | Freq: Three times a day (TID) | INTRAMUSCULAR | Status: DC
  Administered 2023-10-18 – 2023-10-19 (×2): 5000 [IU] via SUBCUTANEOUS
  Filled 2023-10-18 (×2): qty 1

## 2023-10-18 MED ORDER — AMLODIPINE BESYLATE 10 MG PO TABS: 10.0000 mg | ORAL_TABLET | Freq: Every day | ORAL | Status: DC

## 2023-10-18 MED ORDER — CYCLOBENZAPRINE HCL 5 MG PO TABS: 5.0000 mg | ORAL_TABLET | Freq: Three times a day (TID) | ORAL | Status: DC | PRN

## 2023-10-18 MED ORDER — ATORVASTATIN CALCIUM 40 MG PO TABS: 40.0000 mg | ORAL_TABLET | Freq: Every day | ORAL | Status: DC

## 2023-10-18 MED ORDER — CLOPIDOGREL BISULFATE 75 MG PO TABS: 75.0000 mg | ORAL_TABLET | Freq: Every day | ORAL | Status: DC

## 2023-10-18 MED ORDER — HEPARIN SODIUM (PORCINE) 1000 UNIT/ML IJ SOLN
INTRAMUSCULAR | Status: AC
  Filled 2023-10-18: qty 6

## 2023-10-18 MED ORDER — PANTOPRAZOLE SODIUM 40 MG IV SOLR
40.0000 mg | Freq: Every day | INTRAVENOUS | Status: DC
  Administered 2023-10-18: 40 mg via INTRAVENOUS
  Filled 2023-10-18: qty 10

## 2023-10-18 MED ORDER — PENTAFLUOROPROP-TETRAFLUOROETH EX AERO: 1.0000 | INHALATION_SPRAY | CUTANEOUS | Status: DC | PRN

## 2023-10-18 MED ORDER — FREE WATER
30.0000 mL | Freq: Four times a day (QID) | Status: DC
  Administered 2023-10-18 – 2023-10-21 (×11): 30 mL

## 2023-10-18 MED ORDER — HEPARIN SODIUM (PORCINE) 1000 UNIT/ML DIALYSIS: 1000.0000 [IU] | INTRAMUSCULAR | Status: DC | PRN

## 2023-10-18 MED ORDER — INSULIN ASPART 100 UNIT/ML IJ SOLN: 0.0000 [IU] | INTRAMUSCULAR | Status: DC

## 2023-10-18 MED ORDER — ATORVASTATIN CALCIUM 40 MG PO TABS
40.0000 mg | ORAL_TABLET | Freq: Every day | ORAL | 0 refills | Status: DC
  Filled 2023-10-18: qty 30, 30d supply, fill #0

## 2023-10-18 MED ORDER — AMLODIPINE BESYLATE 10 MG PO TABS
10.0000 mg | ORAL_TABLET | Freq: Every day | ORAL | 0 refills | Status: AC
Start: 2023-10-18 — End: ?
  Filled 2023-10-18: qty 30, 30d supply, fill #0

## 2023-10-18 MED ORDER — CLOPIDOGREL BISULFATE 75 MG PO TABS
75.0000 mg | ORAL_TABLET | Freq: Every day | ORAL | 0 refills | Status: DC
  Filled 2023-10-18: qty 30, 30d supply, fill #0

## 2023-10-18 MED ORDER — DOCUSATE SODIUM 50 MG/5ML PO LIQD
100.0000 mg | Freq: Two times a day (BID) | ORAL | 0 refills | Status: DC
  Filled 2023-10-18: qty 100, 5d supply, fill #0

## 2023-10-18 MED ORDER — HEPARIN SODIUM (PORCINE) 1000 UNIT/ML DIALYSIS
4000.0000 [IU] | INTRAMUSCULAR | Status: DC | PRN
  Administered 2023-10-18: 4000 [IU] via INTRAVENOUS_CENTRAL

## 2023-10-18 MED ORDER — OSMOLITE 1.5 CAL PO LIQD
1000.0000 mL | ORAL | Status: DC
  Administered 2023-10-18: 1000 mL
  Filled 2023-10-18: qty 1000

## 2023-10-18 MED ORDER — ASPIRIN 81 MG PO CHEW: 81.0000 mg | CHEWABLE_TABLET | Freq: Every day | ORAL | Status: DC

## 2023-10-18 MED ORDER — LIDOCAINE-PRILOCAINE 2.5-2.5 % EX CREA
1.0000 | TOPICAL_CREAM | CUTANEOUS | Status: DC | PRN
Start: 2023-10-18 — End: 2023-10-18

## 2023-10-18 MED ORDER — BISACODYL 5 MG PO TBEC: 5.0000 mg | DELAYED_RELEASE_TABLET | Freq: Every day | ORAL | Status: DC | PRN

## 2023-10-18 MED ORDER — CARVEDILOL 3.125 MG PO TABS
9.3750 mg | ORAL_TABLET | Freq: Two times a day (BID) | ORAL | 0 refills | Status: DC
  Filled 2023-10-18: qty 30, 5d supply, fill #0

## 2023-10-18 MED ORDER — BISACODYL 5 MG PO TBEC
5.0000 mg | DELAYED_RELEASE_TABLET | Freq: Every day | ORAL | 0 refills | Status: DC | PRN
  Filled 2023-10-18: qty 30, 30d supply, fill #0

## 2023-10-18 MED ORDER — ISOSORBIDE DINITRATE 10 MG PO TABS
5.0000 mg | ORAL_TABLET | Freq: Two times a day (BID) | ORAL | 0 refills | Status: AC
  Filled 2023-10-18: qty 15, 15d supply, fill #0

## 2023-10-18 MED ORDER — LANTHANUM CARBONATE 500 MG PO CHEW
1000.0000 mg | CHEWABLE_TABLET | Freq: Three times a day (TID) | ORAL | Status: DC
  Filled 2023-10-18 (×2): qty 2

## 2023-10-18 MED ORDER — ORAL CARE MOUTH RINSE: 15.0000 mL | OROMUCOSAL | Status: DC | PRN

## 2023-10-18 MED ORDER — ALTEPLASE 2 MG IJ SOLR: 2.0000 mg | Freq: Once | INTRAMUSCULAR | Status: DC | PRN

## 2023-10-18 MED ORDER — ANTICOAGULANT SODIUM CITRATE 4% (200MG/5ML) IV SOLN: 5.0000 mL | Status: DC | PRN

## 2023-10-18 MED ORDER — ISOSORBIDE DINITRATE 5 MG PO TABS
5.0000 mg | ORAL_TABLET | Freq: Two times a day (BID) | ORAL | Status: DC
  Administered 2023-10-18: 5 mg
  Filled 2023-10-18 (×2): qty 1

## 2023-10-18 MED ORDER — ONDANSETRON HCL 4 MG/2ML IJ SOLN
4.0000 mg | Freq: Four times a day (QID) | INTRAMUSCULAR | 0 refills | Status: DC | PRN
  Filled 2023-10-18: qty 2, 1d supply, fill #0

## 2023-10-18 MED ORDER — DOCUSATE SODIUM 50 MG/5ML PO LIQD
100.0000 mg | Freq: Two times a day (BID) | ORAL | Status: DC
  Administered 2023-10-18: 100 mg
  Filled 2023-10-18: qty 10

## 2023-10-18 MED ORDER — ASPIRIN 81 MG PO CHEW
81.0000 mg | CHEWABLE_TABLET | Freq: Every day | ORAL | 0 refills | Status: DC
  Filled 2023-10-18: qty 30, 30d supply, fill #0

## 2023-10-18 MED ORDER — LIDOCAINE HCL (PF) 1 % IJ SOLN: 5.0000 mL | INTRAMUSCULAR | Status: DC | PRN

## 2023-10-18 MED ORDER — PROSOURCE TF20 ENFIT COMPATIBL EN LIQD: 60.0000 mL | Freq: Every day | ENTERAL | Status: DC

## 2023-10-18 MED ORDER — LANTHANUM CARBONATE 1000 MG PO CHEW
1000.0000 mg | CHEWABLE_TABLET | Freq: Three times a day (TID) | ORAL | 0 refills | Status: DC
  Filled 2023-10-18: qty 30, 10d supply, fill #0

## 2023-10-18 MED ORDER — CARVEDILOL 6.25 MG PO TABS
9.3750 mg | ORAL_TABLET | Freq: Two times a day (BID) | ORAL | Status: DC
  Administered 2023-10-18: 9.375 mg
  Filled 2023-10-18: qty 1

## 2023-10-18 MED ORDER — INSULIN ASPART 100 UNIT/ML IJ SOLN
0.0000 [IU] | INTRAMUSCULAR | 11 refills | Status: AC
  Filled 2023-10-18: qty 10, 28d supply, fill #0

## 2023-10-18 MED ORDER — HEPARIN SODIUM (PORCINE) 1000 UNIT/ML DIALYSIS
2000.0000 [IU] | INTRAMUSCULAR | Status: DC | PRN
  Administered 2023-10-18: 2000 [IU] via INTRAVENOUS_CENTRAL

## 2023-10-18 MED ORDER — CYCLOBENZAPRINE HCL 5 MG PO TABS
5.0000 mg | ORAL_TABLET | Freq: Three times a day (TID) | ORAL | 0 refills | Status: DC | PRN
  Filled 2023-10-18: qty 30, 10d supply, fill #0

## 2023-10-18 NOTE — Progress Notes (Signed)
 Urbano Albright, MD  Physician Physical Medicine and Rehabilitation   PMR Pre-admission    Signed   Date of Service: 10/18/2023 11:19 AM  Related encounter: ED to Hosp-Admission (Current) from 10/07/2023 in [place] [facility]   Signed     Expand All Collapse All  PMR Admission Coordinator Pre-Admission Assessment   Patient: Nathan Campbell is an 48 y.o., male MRN: [id number] DOB: 08/15/1975 Height: 6' (182.9 cm) Weight: 129.5 kg                                                                                                                                                  Insurance Information HMO: yes    PPO:      PCP:      IPA:      80/20:      OTHER:  PRIMARY: U[employer] Medicare      Policy#: [id number]      Subscriber: pt CM Name: Deatrice      Phone#: [phone]     Fax#: [phone] Pre-Cert#: [id number] auth for CIR from [facility] with Home and [facility] with updates due to fax listed above on 8/11      Employer:  Benefits:  Phone #: [phone]     Name:  Eff. Date: 07/14/23     Deduct: $0      Out of Pocket Max: $3900 (met)      Life Max:   CIR: $430/day for days 1-4      SNF: 20 full days Outpatient:      Co-Pay: $20/visit Home Health: 80%      Co-Pay: 20% DME: 80%     Co-Pay: 20% Providers:  SECONDARY:       Policy#:       Phone#:    Artist:       Phone#:    The Engineer, materials Information Summary" for patients in Inpatient Rehabilitation Facilities with attached "Privacy Act Statement-Health Care Records" was provided and verbally reviewed with: Patient and Family   Emergency Contact Information Contact Information       Name Relation Home Work Mobile    Nathan Campbell,Nathan Campbell Mother [phone]    Nathan Campbell,Nathan Campbell Sister     [phone]         Other Contacts   None on File      Current Medical History  Patient Admitting Diagnosis: CVA    History of Present Illness: Pt is a 48 y/o male with PMH of systolic and  diastolic CHF, anemia, HTN, obesity, and ESRD on HD TTS who admitted to [facility] with acute onset of left sided weakness and neglect.  He is compliant with his dialysis and stated in his normal state of health at 5 AM with onset of symptoms at 630 AM.  On arrival to ED pt with NIHSS 13, SBP  as high as 239, pt restless/agitated, and he ultimately had to be intubated for airway protection.  CT revealed R MCA ischemic changes with possible calcified embolus in the proximal right m1 branch.  Pt did receive TNK without significant improvement.  Pt was taken to IR for angio and thrombectomy with successful revascularization.  Etiology of CVA felt to be large vessel disease given clacified plaque.  MRI confirmed large right MCA infarct with diffuse cortical thickening and some mild mass effect.  EF 50-55%, A1C 5.1.  Recommendations for aspirin  and plavix  x3 months and then aspiring alone.  He was extubated and is on room air.     Complete NIHSS TOTAL: 10 Glasgow Coma Scale Score: 15   Patient's medical record from [facility] has been reviewed by the rehabilitation admission coordinator and physician.   Past Medical History      Past Medical History:  Diagnosis Date   Acute combined systolic and diastolic CHF, NYHA class 4 (HCC) 06/10/2016   Anemia     Cardiomyopathy (HCC) 06/14/2016   CHF (congestive heart failure) (HCC)     COVID 10/2020   Dyspnea     ESRD on hemodialysis (HCC)      TTS at [facility]   Hypertension     Hypertensive heart and kidney disease with heart failure (HCC) 06/14/2016   Hypertensive heart disease with congestive heart failure (HCC)     Obesity            Has the patient had major surgery during 100 days prior to admission? Yes   Family History  family history includes Colon cancer in his cousin; Diabetes in his father; Diabetes Mellitus II in his paternal grandfather; Heart disease in his cousin and cousin; Hypertension in his father, mother, paternal grandmother,  and sister.     Current Medications   Current Medications    Current Facility-Administered Medications:    acetaminophen  (TYLENOL ) tablet 650 mg, 650 mg, Oral, Q4H PRN, 650 mg at 10/15/23 1035 **OR** acetaminophen  (TYLENOL ) 160 MG/5ML solution 650 mg, 650 mg, Per Tube, Q4H PRN, 650 mg at 10/18/23 0142 **OR** acetaminophen  (TYLENOL ) suppository 650 mg, 650 mg, Rectal, Q4H PRN, Deveshwar, Sanjeev, MD   alteplase  (CATHFLO ACTIVASE ) injection 2 mg, 2 mg, Intracatheter, Once PRN, Fritz Belvie DEL, NP   amLODipine  (NORVASC ) tablet 10 mg, 10 mg, Per Tube, Daily, Alva, Rakesh V, MD, 10 mg at 10/17/23 1108   anticoagulant sodium citrate  solution 5 mL, 5 mL, Intracatheter, PRN, Fritz Belvie DEL, NP   [DISCONTINUED] aspirin  suppository 300 mg, 300 mg, Rectal, Daily **OR** aspirin  chewable tablet 81 mg, 81 mg, Per Tube, Daily, Shafer, Devon, NP, 81 mg at 10/17/23 1108   atorvastatin  (LIPITOR) tablet 40 mg, 40 mg, Per Tube, Daily, Jerri Pfeiffer, MD, 40 mg at 10/17/23 1110   bisacodyl  (DULCOLAX) EC tablet 5 mg, 5 mg, Oral, Daily PRN, Alva, Rakesh V, MD   carvedilol  (COREG ) tablet 9.375 mg, 9.375 mg, Per Tube, BID, Gonfa, Taye T, MD, 9.375 mg at 10/17/23 2200   Chlorhexidine  Gluconate Cloth 2 % PADS 6 each, 6 each, Topical, Q0600, Lenon Charmaine BRAVO, NP, 6 each at 10/18/23 0557   clopidogrel  (PLAVIX ) tablet 75 mg, 75 mg, Per Tube, Daily, Jerri Pfeiffer, MD, 75 mg at 10/17/23 1109   cyclobenzaprine  (FLEXERIL ) tablet 5 mg, 5 mg, Per Tube, TID PRN, Jerri Pfeiffer, MD, 5 mg at 10/18/23 0142   docusate (COLACE) 50 MG/5ML liquid 100 mg, 100 mg, Per Tube, BID, Alva, Rakesh V, MD,  100 mg at 10/17/23 2200   feeding supplement (OSMOLITE 1.5 CAL) liquid 1,000 mL, 1,000 mL, Per Tube, Continuous, Jerri Pfeiffer, MD, Last Rate: 35 mL/hr at 10/17/23 2205, 1,000 mL at 10/17/23 2205   feeding supplement (PROSource TF20) liquid 60 mL, 60 mL, Per Tube, Daily, Adolph, Lauren E, PA-C, 60 mL at 10/17/23 1109   free water  30 mL, 30  mL, Per Tube, Q6H, Jerri Pfeiffer, MD, 30 mL at 10/18/23 0558   heparin  bolus via infusion 1,500 Units, 1,500 Units, Intravenous, Once, Geralynn Charleston, MD   heparin  injection 1,000 Units, 1,000 Units, Intracatheter, PRN, Fritz Belvie DEL, NP   heparin  injection 2,000 Units, 2,000 Units, Dialysis, PRN, Fritz Belvie DEL, NP   heparin  injection 4,000 Units, 4,000 Units, Dialysis, PRN, Fritz Belvie DEL, NP, 4,000 Units at 10/18/23 0948   heparin  injection 5,000 Units, 5,000 Units, Subcutaneous, Q8H, Sethi, Pramod S, MD, 5,000 Units at 10/18/23 9442   insulin  aspart (novoLOG ) injection 0-6 Units, 0-6 Units, Subcutaneous, Q4H, Autry, Lauren E, PA-C, 1 Units at 10/11/23 0403   isosorbide  dinitrate (ISORDIL ) tablet 5 mg, 5 mg, Per Tube, BID, Alva, Rakesh V, MD, 5 mg at 10/17/23 2205   labetalol  (NORMODYNE ) injection 10-20 mg, 10-20 mg, Intravenous, Q2H PRN, Alva, Rakesh V, MD, 20 mg at 10/13/23 0146   lanthanum  (FOSRENOL ) chewable tablet 1,000 mg, 1,000 mg, Per Tube, TID WC, Stovall, Kathryn R, PA-C, 1,000 mg at 10/17/23 8165   lidocaine  (PF) (XYLOCAINE ) 1 % injection 5 mL, 5 mL, Intradermal, PRN, Fritz Belvie DEL, NP   lidocaine -prilocaine  (EMLA ) cream 1 Application, 1 Application, Topical, PRN, Fritz Belvie DEL, NP   ondansetron  (ZOFRAN ) injection 4 mg, 4 mg, Intravenous, Q6H PRN, Adolph, Lauren E, PA-C, 4 mg at 10/13/23 1741   Oral care mouth rinse, 15 mL, Mouth Rinse, 4 times per day, Michaela Aisha SQUIBB, MD, 15 mL at 10/17/23 2201   Oral care mouth rinse, 15 mL, Mouth Rinse, PRN, Michaela Aisha SQUIBB, MD   pantoprazole  (PROTONIX ) injection 40 mg, 40 mg, Intravenous, QHS, de La Torre, Wharton E, NP, 40 mg at 10/17/23 2200   pentafluoroprop-tetrafluoroeth (GEBAUERS) aerosol 1 Application, 1 Application, Topical, PRN, Fritz Belvie DEL, NP     Patients Current Diet:  Diet Order                  DIET - DYS 1 Room service appropriate? No; Fluid consistency: Nectar Thick  Diet  effective now                         Precautions / Restrictions Precautions Precautions: Fall Precaution/Restrictions Comments: cortrak, watch SpO2 and RR, left inattention Restrictions Weight Bearing Restrictions Per Provider Order: No    Has the patient had 2 or more falls or a fall with injury in the past year?No   Prior Activity Level Community (5-7x/wk): independent, driving, no DME   Prior Functional Level Prior Function Prior Level of Function : Independent/Modified Independent, Driving Mobility Comments: no use of DME ADLs Comments: independent, driving himself to HD   Self Care: Did the patient need help bathing, dressing, using the toilet or eating?  Independent   Indoor Mobility: Did the patient need assistance with walking from room to room (with or without device)? Independent   Stairs: Did the patient need assistance with internal or external stairs (with or without device)? Independent   Functional Cognition: Did the patient need help planning regular tasks such as shopping or remembering to take  medications? Independent   Patient Information Are you of Hispanic, Latino/a,or Spanish origin?: A. No, not of Hispanic, Latino/a, or Spanish origin What is your race?: B. Black or African American Do you need or want an interpreter to communicate with a doctor or health care staff?: 0. No   Patient's Response To:  Health Literacy and Transportation Is the patient able to respond to health literacy and transportation needs?: Yes Health Literacy - How often do you need to have someone help you when you read instructions, pamphlets, or other written material from your doctor or pharmacy?: Never In the past 12 months, has lack of transportation kept you from medical appointments or from getting medications?: No In the past 12 months, has lack of transportation kept you from meetings, work, or from getting things needed for daily living?: No   Home Assistive  Devices / Equipment Home Equipment: None   Prior Device Use: Indicate devices/aids used by the patient prior to current illness, exacerbation or injury? None of the above   Current Functional Level Cognition   Arousal/Alertness: Awake/alert Overall Cognitive Status: Impaired/Different from baseline Orientation Level: Oriented X4 Attention: Focused, Sustained Focused Attention: Appears intact Sustained Attention: Impaired Sustained Attention Impairment: Verbal complex Memory: Impaired Memory Impairment: Retrieval deficit, Storage deficit, Decreased recall of new information (Immediate: 4/5 with repetition x3; delayed: 1/5; 4/5 with cues) Awareness: Impaired Awareness Impairment: Intellectual impairment Problem Solving: Impaired Problem Solving Impairment: Verbal complex Executive Function: Sequencing, Organizing, Reasoning Reasoning: Impaired Reasoning Impairment: Verbal complex Sequencing: Impaired Sequencing Impairment: Verbal complex (clock drawing: 0/4) Organizing: Impaired Organizing Impairment: Verbal complex    Extremity Assessment (includes Sensation/Coordination)   Upper Extremity Assessment: Right hand dominant, LUE deficits/detail LUE Deficits / Details: PROM WFL, impaired sensation and proprioception.  Pt showing some AROM, but limited to shoulder elevation, retraction and shoulder f/e (extension more than flexion)- worked on [employer] with push/pull. LUE Sensation: decreased light touch, decreased proprioception LUE Coordination: decreased fine motor, decreased gross motor  Lower Extremity Assessment: Defer to PT evaluation LLE Deficits / Details: grossly 4-/5 to MMT, pt reports sensation intact LLE Sensation: WNL LLE Coordination: decreased fine motor, decreased gross motor     ADLs   Overall ADL's : Needs assistance/impaired Eating/Feeding: NPO Grooming: Sitting, Oral care, Wash/dry face, Moderate assistance Grooming Details (indicate cue type and reason): needs  assist for bimanual tasks, min assist for safety with oral care using suction toothbrush Upper Body Bathing: Maximal assistance, Sitting Lower Body Bathing: Maximal assistance, Bed level Upper Body Dressing : Moderate assistance, Sitting Lower Body Dressing: Maximal assistance, Sit to/from stand, Bed level Lower Body Dressing Details (indicate cue type and reason): assist to initate donning socks over toes but then pt able to manage over feet from bed level, needs R UE support in standing Toilet Transfer: Minimal assistance, Ambulation Toilet Transfer Details (indicate cue type and reason): quad cane, +2 safety with PT Functional mobility during ADLs: Minimal assistance, Cane (quad cane)     Mobility   Overal bed mobility: Needs Assistance Bed Mobility: Rolling, Sidelying to Sit Rolling: Contact guard assist Sidelying to sit: Contact guard assist, HOB elevated Supine to sit: Total assist, HOB elevated Sit to sidelying: Mod assist, +2 for safety/equipment General bed mobility comments: Pt sitting EOB with OT at beginning of session     Transfers   Overall transfer level: Needs assistance Equipment used: Quad cane Transfers: Sit to/from Stand Sit to Stand: Min assist, +2 safety/equipment Bed to/from chair/wheelchair/BSC transfer type:: Step pivot Step pivot transfers:  Mod assist General transfer comment: STS from EOB and recliner with cues for hand placement and min A for power up     Ambulation / Gait / Stairs / Wheelchair Mobility   Ambulation/Gait Ambulation/Gait assistance: Min assist, +2 safety/equipment Gait Distance (Feet): 15 Feet (+10) Assistive device: Quad cane Gait Pattern/deviations: Trunk flexed, Step-through pattern, Narrow base of support, Step-to pattern, Decreased stride length, Decreased step length - left General Gait Details: Pt demonstrates short L step length and flexed trunk requiring cues for increased step length and upright posture. Dense cues for sequencing  with quad cane with pt demonstrating slightly improved recall with increased time for processing at end of trial. Cues for slowed pacing for safety due to increased instability. Chair follow for safety with pt requiring 1 seated rest break Gait velocity interpretation: <1.8 ft/sec, indicate of risk for recurrent falls     Posture / Balance Dynamic Sitting Balance Sitting balance - Comments: sitting EOB Balance Overall balance assessment: Needs assistance Sitting-balance support: Single extremity supported, No upper extremity supported Sitting balance-Leahy Scale: Fair Sitting balance - Comments: sitting EOB Postural control: Posterior lean, Left lateral lean Standing balance support: Single extremity supported, During functional activity, Reliant on assistive device for balance Standing balance-Leahy Scale: Poor Standing balance comment: with quad cane and min A     Special considerations/ Life events CPAP, Dialysis: Hemodialysis Tuesday, Thursday, and Saturday, Diabetic management A1C 5.1, and Special service needs cortrak        Previous Home Environment (from acute therapy documentation) Living Arrangements: Alone  Lives With:  (unknown) Available Help at Discharge:  (unknown) Type of Home: Apartment Home Layout: One level Home Access: Stairs to enter Entrance Stairs-Rails: Right, Left Entrance Stairs-Number of Steps: 1 flight   Discharge Living Setting Plans for Discharge Living Setting: Patient's home (mom to stay with him) Type of Home at Discharge: Apartment Discharge Home Layout: One level Discharge Home Access: Stairs to enter Entrance Stairs-Rails: Right, Left Entrance Stairs-Number of Steps: full flight Discharge Bathroom Shower/Tub: Tub/shower unit Discharge Bathroom Toilet: Standard Discharge Bathroom Accessibility: Yes How Accessible: Accessible via walker Does the patient have any problems obtaining your medications?: No   Social/Family/Support  Systems Anticipated Caregiver: mom, Nathan Campbell Anticipated Caregiver's Contact Information: [phone] Ability/Limitations of Caregiver: none stated, she is retired Medical laboratory scientific officer: 24/7 Discharge Plan Discussed with Primary Caregiver: Yes Is Caregiver In Agreement with Plan?: Yes Does Caregiver/Family have Issues with Lodging/Transportation while Pt is in Rehab?: No     Goals Patient/Family Goal for Rehab: PT/OT/SLP supervision to mod I Expected length of stay: 9-12 days Additional Information: HD TTS; Discharge plan: return to pt's apartment, mom can provide oversight and assist with modified diet if needed Pt/Family Agrees to Admission and willing to participate: Yes Program Orientation Provided & Reviewed with Pt/Caregiver Including Roles  & Responsibilities: Yes     Decrease burden of Care through IP rehab admission: n/a     Possible need for SNF placement upon discharge: Not anticipated.  Mom is a retired Engineer, civil (consulting) and can provide expected level of care at discharge.      Patient Condition: This patient's medical and functional status has changed since the consult dated: 10/14/23 in which the Rehabilitation Physician determined and documented that the patient's condition is appropriate for intensive rehabilitative care in an inpatient rehabilitation facility. See History of Present Illness (above) for medical update. Functional changes are: min assist with mobility and ADLs, advanced to D1/nectar diet. Patient's medical and functional status update has been  discussed with the Rehabilitation physician and patient remains appropriate for inpatient rehabilitation. Will admit to inpatient rehab today.   Preadmission Screen Completed By:  Reche FORBES Lowers, PT, DPT 10/18/2023 11:19 AM ______________________________________________________________________   Discussed status with Dr. Urbano on 10/18/23 at 11:19 AM  and received approval for admission today.   Admission  Coordinator:  Bernard Slayden E Lyrical Sowle,PT, DPT time 11:19 AM Pattricia 10/18/23             Revision History

## 2023-10-18 NOTE — Progress Notes (Signed)
 SLP Cancellation Note  Patient Details Name: Roberto Romanoski MRN: [id number] DOB: [birth year]   Cancelled treatment:       Reason Eval/Treat Not Completed: Patient at procedure or test/unavailable   Takai Chiaramonte, Consuelo Fitch 10/18/2023, 9:14 AM

## 2023-10-18 NOTE — Progress Notes (Signed)
 Nutrition Follow-up  DOCUMENTATION CODES:  Obesity unspecified  INTERVENTION:  Continue current diet as ordered Encourage PO intake Continue tube feeding via cortrak: Osmolite 1.5 at 65 ml/h (1560 ml per day) Prosource TF20 60 ml 1x/d Provides 2420 kcal, 117 gm protein, 1185 ml free water  daily  NUTRITION DIAGNOSIS:  Inadequate oral intake related to inability to eat as evidenced by NPO status. - remains applicable   GOAL:  Patient will meet greater than or equal to 90% of their needs - progressing  MONITOR:  TF tolerance  REASON FOR ASSESSMENT:  Consult Enteral/tube feeding initiation and management  ASSESSMENT:  Pt with PMH of obesity, HTN, end-stage renal dz on HD TTS, combined systolic and diastolic heart failure admitted 7/25 with L-sided weakness dx with acute R MCA infarct.  7/25 - admitted acute R MCA s/p TNK and mechanical thrombectomy; intubated 7/27 - TF protocol started 7/28 - cortrak placed; tip gastric  7/29 - extubated  7/30 - NG tube placed for decompression; gastric per xray 8/1 - pt pulled out NG; start trickle TF via cortrak tube 8/3 - SLP BSE, NPO, ready for MBS 8/4 - MBS, DYS1/nectar thick  Pt out of room for HD this AM. Discussed with RN. Reports that pt didn't get to eat breakfast this AM before he had to go off the unit but did eat all of his dinner last night.   Noted that pt being transferred to CIR this afternoon. Would likely benefit from a kcal count to determine if he can meet his needs orally or if feeds can start being weaned.    NUTRITION - FOCUSED PHYSICAL EXAM: Flowsheet Row Most Recent Value  Orbital Region No depletion  Upper Arm Region No depletion  Thoracic and Lumbar Region No depletion  Buccal Region No depletion  Temple Region No depletion  Clavicle Bone Region No depletion  Clavicle and Acromion Bone Region No depletion  Scapular Bone Region No depletion  Dorsal Hand No depletion  Patellar Region No depletion  Anterior  Thigh Region No depletion  Posterior Calf Region No depletion  Edema (RD Assessment) None  Hair Reviewed  Eyes Unable to assess  Mouth Unable to assess  Skin Reviewed  Nails Reviewed    Diet Order:   Diet Order             DIET - DYS 1 Room service appropriate? No; Fluid consistency: Nectar Thick  Diet effective now                   EDUCATION NEEDS:  Not appropriate for education at this time  Skin:  Skin Assessment: Reviewed RN Assessment  Last BM:  8/4 - type 7  Height:  Ht Readings from Last 1 Encounters:  10/10/23 6' (1.829 m)    Weight:  Wt Readings from Last 1 Encounters:  10/18/23 135.1 kg    Ideal Body Weight:  80.9 kg  BMI:  Body mass index is 40.39 kg/m.  Estimated Nutritional Needs:  Kcal:  2300-2500 Protein:  100-120 grams Fluid:  1.2 L/day    Vernell Lukes, RD, LDN, CNSC Registered Dietitian II Please reach out via secure chat

## 2023-10-18 NOTE — Discharge Summary (Addendum)
 Stroke Discharge Summary  Patient ID: Nathan Campbell   MRN: [id number]      DOB: [birth year]  Date of Admission: 10/07/2023 Date of Discharge: 10/18/2023  Attending Physician:  Stroke, Md, MD Consultant(s):   Treatment Team:  Geralynn Charleston, MD Rosario Leatrice FERNS, MD Kathrin Mignon DASEN, MD  Patient's PCP:  Pcp, No  DISCHARGE PRIMARY DIAGNOSIS:  Stroke: right MCA large infarct with right M1 occlusion s/p TNK and IR with TICI2b, etiology: Likely large vessel disease given calcified plaque   Secondary diagnosis CHF Hypertension Hyperlipidemia Dysphagia ESRD on hemodialysis Obesity   Allergies as of 10/18/2023   No Known Allergies      Medication List     STOP taking these medications    acetaminophen  325 MG tablet Commonly known as: Tylenol    allopurinol  100 MG tablet Commonly known as: ZYLOPRIM    Auryxia  1 GM 210 MG(Fe) tablet Generic drug: ferric citrate    benzonatate  100 MG capsule Commonly known as: TESSALON    DIALYVITE 800/ZINC PO   guaiFENesin -dextromethorphan 100-10 MG/5ML syrup Commonly known as: ROBITUSSIN DM   isosorbide  mononitrate 10 MG tablet Commonly known as: ISMO    omeprazole  20 MG capsule Commonly known as: PRILOSEC   promethazine -dextromethorphan 6.25-15 MG/5ML syrup Commonly known as: PROMETHAZINE -DM   torsemide  20 MG tablet Commonly known as: DEMADEX        TAKE these medications    amLODipine  10 MG tablet Commonly known as: NORVASC  Place 1 tablet (10 mg total) into feeding tube daily. What changed: how to take this   aspirin  81 MG chewable tablet Place 1 tablet (81 mg total) into feeding tube daily.   atorvastatin  40 MG tablet Commonly known as: LIPITOR Place 1 tablet (40 mg total) into feeding tube daily.   bisacodyl  5 MG EC tablet Commonly known as: DULCOLAX Take 1 tablet (5 mg total) by mouth daily as needed for moderate constipation.   carvedilol  3.125 MG tablet Commonly known as: COREG  Place 3 tablets (9.375 mg  total) into feeding tube 2 (two) times daily. What changed:  medication strength how much to take how to take this   clopidogrel  75 MG tablet Commonly known as: PLAVIX  Place 1 tablet (75 mg total) into feeding tube daily.   cyclobenzaprine  5 MG tablet Commonly known as: FLEXERIL  Place 1 tablet (5 mg total) into feeding tube 3 (three) times daily as needed for muscle spasms.   docusate 50 MG/5ML liquid Commonly known as: COLACE Place 10 mLs (100 mg total) into feeding tube 2 (two) times daily.   insulin  aspart 100 UNIT/ML injection Commonly known as: novoLOG  Inject 0-6 Units into the skin every 4 (four) hours.   isosorbide  dinitrate 5 MG tablet Commonly known as: ISORDIL  Place 1 tablet (5 mg total) into feeding tube 2 (two) times daily.   lanthanum  1000 MG chewable tablet Commonly known as: FOSRENOL  Place 1 tablet (1,000 mg total) into feeding tube 3 (three) times daily with meals.   ondansetron  4 MG/2ML Soln injection Commonly known as: ZOFRAN  Inject 2 mLs (4 mg total) into the vein every 6 (six) hours as needed for nausea.        LABORATORY STUDIES CBC    Component Value Date/Time   WBC 11.2 (H) 10/18/2023 1000   RBC 3.44 (L) 10/18/2023 1000   HGB 10.9 (L) 10/18/2023 1000   HGB 9.2 (L) 01/22/2021 1235   HCT 33.9 (L) 10/18/2023 1000   HCT 28.9 (L) 01/22/2021 1235   PLT 384 10/18/2023 1000   PLT  208 01/22/2021 1235   MCV 98.5 10/18/2023 1000   MCV 93 01/22/2021 1235   MCH 31.7 10/18/2023 1000   MCHC 32.2 10/18/2023 1000   RDW 13.1 10/18/2023 1000   RDW 12.0 01/22/2021 1235   LYMPHSABS 1.1 10/10/2023 0625   LYMPHSABS 0.8 01/22/2021 1235   MONOABS 1.6 (H) 10/10/2023 0625   EOSABS 0.3 10/10/2023 0625   EOSABS 0.1 01/22/2021 1235   BASOSABS 0.0 10/10/2023 0625   BASOSABS 0.0 01/22/2021 1235   CMP    Component Value Date/Time   NA 135 10/18/2023 1000   K 5.8 (H) 10/18/2023 1000   CL 89 (L) 10/18/2023 1000   CO2 23 10/18/2023 1000   GLUCOSE 103 (H)  10/18/2023 1000   BUN 139 (H) 10/18/2023 1000   CREATININE 18.98 (H) 10/18/2023 1000   CALCIUM  9.8 10/18/2023 1000   PROT 6.1 (L) 10/08/2023 0505   ALBUMIN 3.3 (L) 10/18/2023 1000   AST 10 (L) 10/08/2023 0505   ALT 11 10/08/2023 0505   ALKPHOS 66 10/08/2023 0505   BILITOT 0.8 10/08/2023 0505   GFRNONAA 3 (L) 10/18/2023 1000   GFRAA 32 (L) 05/13/2017 1653   COAGS Lab Results  Component Value Date   INR 1.0 10/07/2023   Lipid Panel    Component Value Date/Time   CHOL 130 10/08/2023 0505   TRIG 203 (H) 10/08/2023 0953   HDL 23 (L) 10/08/2023 0505   CHOLHDL 5.7 10/08/2023 0505   VLDL UNABLE TO CALCULATE IF TRIGLYCERIDE OVER 400 mg/dL 92/73/7974 9494   LDLCALC UNABLE TO CALCULATE IF TRIGLYCERIDE OVER 400 mg/dL 92/73/7974 9494   YhaJ8R  Lab Results  Component Value Date   HGBA1C 5.1 10/08/2023   Alcohol Level    Component Value Date/Time   [facility] <15 10/07/2023 0851     SIGNIFICANT DIAGNOSTIC STUDIES DG CHEST PORT 1 VIEW Result Date: 10/17/2023 CLINICAL DATA:  Shortness of breath. EXAM: PORTABLE CHEST 1 VIEW COMPARISON:  10/12/2023. FINDINGS: Trachea is midline. Heart is enlarged, stable. Thoracic aorta is calcified. Feeding tube is followed into the stomach with the tip projecting beyond the inferior margin of the image. Contrast is seen in the stomach in association. Lungs are somewhat low in volume. Vague airspace consolidation in the left lower lobe and lingula. IMPRESSION: Vague lingular and left lower lobe airspace opacification may be due to pneumonia. Followup PA and lateral chest X-ray is recommended in 3-4 weeks following trial of antibiotic therapy to ensure resolution and exclude underlying malignancy. Electronically Signed   By: Newell Eke M.D.   On: 10/17/2023 18:15   DG Swallowing Func-Speech Pathology Result Date: 10/17/2023 Table formatting from the original result was not included. Modified Barium Swallow Study Patient Details Name: Nathan Campbell MRN:  [id number] Date of Birth: [birth year] Today's Date: 10/17/2023 HPI/PMH: HPI: Pt is a 48 y/o male who presented 10/07/23 with left-sided weakness and slurred speech. MRI brain 7/26: Large right MCA territory infarct with associated diffuse cortical thickening and some mass effect. Pt s/p TNK & IR revascularization. Intubated 7/25-7/29. 7/29 ileus. PMH: CHF, CM, ESRD on HD TTS, HTN, obesity, HLD Clinical Impression: Clinical Impression: Pt demonstrates a moderate oral dysphagia as well as late initiation of swallowing. Pt has severe left lingual weakness, tongue deviated left, pooled oral secretions spilling on the left and causing coughing prior to starting exam. Despite this pt able to achieve posterior transit of each texture with repetitive slow posterior lingual propulsion and minimal anterior spillage of PO. Able to use a straw. Pt does  not form a bolus and only mashes solids with tongue to palate, cannot manipulate for mastication. All boluses pool for several seconds in the pyriform sinsues prior to hyoid burst. Pt had only one instance of aspiration of thin liquids as swallow initiated. No sensation. Recommend initiating a puree diet and nectar thick liquids. Suspect pts secretion management may improve with oral intake and may be easily advance to thin liquids or improved solids. Pt will need pills crushed in puree as he could not orally transit barium tablet. Factors that may increase risk of adverse event in presence of aspiration Noe & Lianne 2021): No data recorded Recommendations/Plan: Swallowing Evaluation Recommendations Swallowing Evaluation Recommendations Recommendations: PO diet PO Diet Recommendation: Dysphagia 1 (Pureed); Mildly thick liquids (Level 2, nectar thick) Liquid Administration via: Cup; Straw Medication Administration: Crushed with puree Supervision: Staff to assist with self-feeding Oral care recommendations: Use suctioning for oral care Caregiver Recommendations: Have oral suction  available Treatment Plan Treatment Plan Treatment recommendations: Therapy as outlined in treatment plan below Follow-up recommendations: Acute inpatient rehab (3 hours/day) Treatment frequency: Min 2x/week Treatment duration: 2 weeks Interventions: Patient/family education; Trials of upgraded texture/liquids; Diet toleration management by SLP Recommendations Recommendations for follow up therapy are one component of a multi-disciplinary discharge planning process, led by the attending physician.  Recommendations may be updated based on patient status, additional functional criteria and insurance authorization. Assessment: Orofacial Exam: Orofacial Exam Oral Cavity: Oral Hygiene: Pooled secretions Oral Cavity - Dentition: Adequate natural dentition Orofacial Anatomy: WFL Oral Motor/Sensory Function: Suspected cranial nerve impairment CN V - Trigeminal: Not tested CN VII - Facial: Left motor impairment CN IX - Glossopharyngeal, CN X - Vagus: Not tested CN XII - Hypoglossal: Left motor impairment Anatomy: Anatomy: WFL Boluses Administered: Boluses Administered Boluses Administered: Thin liquids (Level 0); Mildly thick liquids (Level 2, nectar thick); Puree; Solid  Oral Impairment Domain: Oral Impairment Domain Lip Closure: Interlabial escape, no progression to anterior lip Tongue control during bolus hold: Posterior escape of greater than half of bolus Bolus preparation/mastication: Minimal chewing/mashing with majority of bolus unchewed Bolus transport/lingual motion: Slow tongue motion Oral residue: Trace residue lining oral structures Location of oral residue : Lateral sulci Initiation of pharyngeal swallow : Pyriform sinuses  Pharyngeal Impairment Domain: Pharyngeal Impairment Domain Soft palate elevation: No bolus between soft palate (SP)/pharyngeal wall (PW) Laryngeal elevation: Complete superior movement of thyroid cartilage with complete approximation of arytenoids to epiglottic petiole Anterior hyoid  excursion: Complete anterior movement Epiglottic movement: Complete inversion Laryngeal vestibule closure: Complete, no air/contrast in laryngeal vestibule Pharyngeal stripping wave : Present - complete Pharyngeal contraction (A/P view only): Complete Pharyngoesophageal segment opening: Complete distension and complete duration, no obstruction of flow Tongue base retraction: No contrast between tongue base and posterior pharyngeal wall (PPW) Pharyngeal residue: Complete pharyngeal clearance  Esophageal Impairment Domain: No data recorded Pill: No data recorded Penetration/Aspiration Scale Score: Penetration/Aspiration Scale Score 1.  Material does not enter airway: Mildly thick liquids (Level 2, nectar thick); Puree; Solid 8.  Material enters airway, passes BELOW cords without attempt by patient to eject out (silent aspiration) : Thin liquids (Level 0) Compensatory Strategies: No data recorded  General Information: Caregiver present: No  Diet Prior to this Study: NPO; Cortrak/Small bore NG tube   No data recorded  No data recorded  Supplemental O2: None (Room air)   History of Recent Intubation: Yes  Behavior/Cognition: Alert; Cooperative Self-Feeding Abilities: Dependent for feeding Baseline vocal quality/speech: Abnormal resonance Volitional Cough: Able to elicit Volitional Swallow: Able  to elicit No data recorded Goal Planning: Prognosis for improved oropharyngeal function: Good No data recorded No data recorded Patient/Family Stated Goal: none stated No data recorded Pain: Pain Assessment Pain Assessment: No/denies pain Pain Intervention(s): Monitored during session End of Session: Start Time:SLP Start Time (ACUTE ONLY): 1150 Stop Time: SLP Stop Time (ACUTE ONLY): 1212 Time Calculation:SLP Time Calculation (min) (ACUTE ONLY): 22 min Charges: SLP Evaluations $ SLP Speech Visit: 1 Visit SLP Evaluations $BSS Swallow: 1 Procedure $MBS Swallow: 1 Procedure $Speech Treatment for Individual: 1 Procedure SLP visit  diagnosis: SLP Visit Diagnosis: Dysphagia, oropharyngeal phase (R13.12) Past Medical History: Past Medical History: Diagnosis Date  Acute combined systolic and diastolic CHF, NYHA class 4 (HCC) 06/10/2016  Anemia   Cardiomyopathy (HCC) 06/14/2016  CHF (congestive heart failure) (HCC)   COVID 10/2020  Dyspnea   ESRD on hemodialysis (HCC)   TTS at [facility]  Hypertension   Hypertensive heart and kidney disease with heart failure (HCC) 06/14/2016  Hypertensive heart disease with congestive heart failure (HCC)   Obesity  Past Surgical History: Past Surgical History: Procedure Laterality Date  A/V FISTULAGRAM Left 05/03/2022  Procedure: A/V Fistulagram;  Surgeon: Eliza Lonni RAMAN, MD;  Location: [facility] INVASIVE CV LAB;  Service: Cardiovascular;  Laterality: Left;  AV FISTULA PLACEMENT Left 11/11/2020  Procedure: LEFT ARM First Stage Basilic Vein Fistula Creation;  Surgeon: Sheree Penne Lonni, MD;  Location: [facility] OR;  Service: Vascular;  Laterality: Left;  BASCILIC VEIN TRANSPOSITION Left 01/23/2021  Procedure: Second Stage Basilic Vein Transposition of Left Arm Arteriovenous  Fistula;  Surgeon: Sheree Penne Lonni, MD;  Location: [facility] OR;  Service: Vascular;  Laterality: Left;  Block  to LMA   COLONOSCOPY    FISTULA SUPERFICIALIZATION Left 05/07/2022  Procedure: PLICATION OF ANEURYSM, LEFT ARM FISTULA;  Surgeon: Eliza Lonni RAMAN, MD;  Location: [facility] OR;  Service: Vascular;  Laterality: Left;  IR CT HEAD LTD  10/07/2023  IR CT HEAD LTD  10/07/2023  IR PATIENT EVAL TECH 0-60 MINS  10/07/2023  IR PERCUTANEOUS ART THROMBECTOMY/INFUSION INTRACRANIAL INC DIAG ANGIO  10/07/2023  NO PAST SURGERIES    PERIPHERAL VASCULAR BALLOON ANGIOPLASTY Left 05/03/2022  Procedure: PERIPHERAL VASCULAR BALLOON ANGIOPLASTY;  Surgeon: Eliza Lonni RAMAN, MD;  Location: [facility] INVASIVE CV LAB;  Service: Cardiovascular;  Laterality: Left;  AVF  RADIOLOGY WITH ANESTHESIA N/A 10/07/2023  Procedure: RADIOLOGY WITH ANESTHESIA;  Surgeon:  Radiologist, Medication, MD;  Location: [facility] OR;  Service: Radiology;  Laterality: N/A; Consuelo Fort, MA CCC-SLP Acute Rehabilitation Services Secure Chat Preferred Office [phone] [place] 10/17/2023, 1:02 PM  DG Abd Portable 1V Result Date: 10/14/2023 CLINICAL DATA:  [id number] Encounter for feeding tube placement [id number] EXAM: PORTABLE ABDOMEN - 1 VIEW COMPARISON:  10/12/2023 abdomen radiograph FINDINGS: Enteric tube courses into the distal stomach with weighted tip in the medial right abdomen likely in the region of the pylorus. No evidence of pneumoperitoneum. IMPRESSION: Enteric tube courses into the distal stomach with weighted tip in the medial right abdomen likely in the region of the pylorus. Electronically Signed   By: Selinda DELENA Blue M.D.   On: 10/14/2023 10:04   IR PERCUTANEOUS ART THROMBECTOMY/INFUSION INTRACRANIAL INC DIAG ANGIO Result Date: 10/12/2023 INDICATION: New onset right gaze deviation, left-sided hemiplegia. Occluded right middle cerebral artery M1 segment on CT angiogram of the head and neck. EXAM: 1. EMERGENT LARGE VESSEL OCCLUSION THROMBOLYSIS (anterior CIRCULATION) COMPARISON:  CT angiogram of the head and neck of October 07, 2023. MEDICATIONS: No antibiotic was administered within 1 hour of  the procedure. ANESTHESIA/SEDATION: General anesthesia. CONTRAST:  Omnipaque  300 approximately 115 cc. FLUOROSCOPY TIME:  Fluoroscopy Time: 41 minutes 24 seconds (29 mGy). COMPLICATIONS: None immediate. TECHNIQUE: Following a full explanation of the procedure along with the potential associated complications, an informed witnessed consent was obtained. The risks of intracranial hemorrhage of 10%, worsening neurological deficit, ventilator dependency, death and inability to revascularize were all reviewed in detail with the patient's daughter. The patient was then put under general anesthesia by the Department of Anesthesiology at [facility] The right groin was prepped and draped  in the usual sterile fashion. Thereafter using modified Seldinger technique, transfemoral access into the right common femoral artery was obtained without difficulty. Over an 0.035 inch guidewire an 8 French 25 cm [place] Pinnacle sheath was inserted. Through this, and also over an 0.035 inch guidewire a combination of a 125 cm 6 French Simmons 2 support catheter inside of a 100 cm 088 Zoom catheter was advanced to the aortic arch region, and selectively positioned in the right common carotid artery and then the distal right internal carotid artery in the proximal cavernous segment. FINDINGS: Right common carotid arteriogram demonstrates the right external carotid artery and its major branches to be widely patent. The right internal carotid artery at the bulb to the cranial skull base demonstrates patency with moderate to moderately severe tortuosity in the proximal 1/3. The petrous, the cavernous and the supraclinoid right ICA demonstrate wide patency with a double U shaped tortuosity of the petrous and cavernous segments extending into the distal cavernous segment. Complete occlusion was evident of the right middle cerebral artery in the proximal M1 segment. Right anterior cerebral artery opacifies into the capillary and venous phases. PROCEDURE: Through the 088 support catheter in the petrous cavernous junction, the combination of an 071 135 cm Zoom aspiration catheter with an 043 Penumbra distal aspiration catheter was advanced over an 018 inch Aristotle micro guidewire with a moderate J configuration. The combination was navigated with modest difficulty due to the inherent tortuosity to the caval cavernous segment. The micro guidewire was then advanced into the right M1 segment followed by the 043 aspiration catheter followed by the 071 aspiration catheter which could only be advanced to just the origin of the right middle cerebral artery due to the severe tortuosity. The micro guidewire was then advanced into  the M2 segment of the superior division followed by the 043 aspiration catheter. The micro guidewire was removed followed by aspiration at the hub of the 043 distal aspiration catheter and also the 071 Zoom aspiration catheter. After a minute, the inner 043 aspiration catheter was removed followed by the 071 aspiration catheter 2 minutes later. A control arteriogram performed through the Zoom support catheter demonstrated revascularization of the right M1 segment and the superior division into the distal M2 segments. The inferior division remained occluded. A second pass was then made using the combination of an 021 162 cm microcatheter advanced inside of an 062 Penumbra aspiration catheter. Over an 018 inch Aristotle micro guidewire, the combination was advanced without difficulty to the M1 segment. The micro guidewire was maneuvered into the inferior division distal M2 region followed by the microcatheter. The guidewire was removed. Good aspiration obtained from the hub of the microcatheter which was then connected to continuous heparinized saline infusion. A 4 mm x 40 mm Solitaire X retrieval device was then deployed in the usual manner. The 062 aspiration catheter was advanced into the proximal inferior division. Aspiration was applied at the hub  of the 062 aspiration catheter for approximately 3 minutes. Thereafter the combination was retrieved and removed. A control angiogram performed through the 088 Zoom aspiration catheter demonstrated complete revascularization of the superior division and also the inferior division to the distal M2 region. A TICI 2B plus revascularization was achieved. Distal branch of the inferior division extending into the posterior frontal anterior parietal region remained occluded. Given the distal nature of the occlusion, aspiration was not attempted. A final control arteriogram performed through the 088 Zoom catheter in the distal right common carotid artery revealed patency of the  intracranial and extracranial portion of the right ICA. The right anterior cerebral artery remained widely patent. The right middle cerebral artery continued to demonstrate a TICI 2B plus revascularization. An 8 French Angio-Seal closure device was deployed at the right groin puncture site. Distal pulses remained present unchanged from prior to the procedure. A flat panel CT of the brain revealed no gross intracranial hemorrhage. The patient was left intubated due to his medical condition and transferred to the neuro ICU for post revascularization care. IMPRESSION: Status post endovascular complete revascularization of the right middle cerebral M1 segment, of the superior division, and partially the inferior division achieving a TICI 2B plus revascularization. PLAN: As per Stroke Neurology. Electronically Signed   By: Thyra Nash M.D.   On: 10/12/2023 08:20   IR CT Head Ltd Result Date: 10/12/2023 INDICATION: New onset right gaze deviation, left-sided hemiplegia. Occluded right middle cerebral artery M1 segment on CT angiogram of the head and neck. EXAM: 1. EMERGENT LARGE VESSEL OCCLUSION THROMBOLYSIS (anterior CIRCULATION) COMPARISON:  CT angiogram of the head and neck of October 07, 2023. MEDICATIONS: No antibiotic was administered within 1 hour of the procedure. ANESTHESIA/SEDATION: General anesthesia. CONTRAST:  Omnipaque  300 approximately 115 cc. FLUOROSCOPY TIME:  Fluoroscopy Time: 41 minutes 24 seconds (29 mGy). COMPLICATIONS: None immediate. TECHNIQUE: Following a full explanation of the procedure along with the potential associated complications, an informed witnessed consent was obtained. The risks of intracranial hemorrhage of 10%, worsening neurological deficit, ventilator dependency, death and inability to revascularize were all reviewed in detail with the patient's daughter. The patient was then put under general anesthesia by the Department of Anesthesiology at [facility]. The right  groin was prepped and draped in the usual sterile fashion. Thereafter using modified Seldinger technique, transfemoral access into the right common femoral artery was obtained without difficulty. Over an 0.035 inch guidewire an 8 French 25 cm [place] Pinnacle sheath was inserted. Through this, and also over an 0.035 inch guidewire a combination of a 125 cm 6 French Simmons 2 support catheter inside of a 100 cm 088 Zoom catheter was advanced to the aortic arch region, and selectively positioned in the right common carotid artery and then the distal right internal carotid artery in the proximal cavernous segment. FINDINGS: Right common carotid arteriogram demonstrates the right external carotid artery and its major branches to be widely patent. The right internal carotid artery at the bulb to the cranial skull base demonstrates patency with moderate to moderately severe tortuosity in the proximal 1/3. The petrous, the cavernous and the supraclinoid right ICA demonstrate wide patency with a double U shaped tortuosity of the petrous and cavernous segments extending into the distal cavernous segment. Complete occlusion was evident of the right middle cerebral artery in the proximal M1 segment. Right anterior cerebral artery opacifies into the capillary and venous phases. PROCEDURE: Through the 088 support catheter in the petrous cavernous junction, the combination of  an 071 135 cm Zoom aspiration catheter with an 043 Penumbra distal aspiration catheter was advanced over an 018 inch Aristotle micro guidewire with a moderate J configuration. The combination was navigated with modest difficulty due to the inherent tortuosity to the caval cavernous segment. The micro guidewire was then advanced into the right M1 segment followed by the 043 aspiration catheter followed by the 071 aspiration catheter which could only be advanced to just the origin of the right middle cerebral artery due to the severe tortuosity. The micro  guidewire was then advanced into the M2 segment of the superior division followed by the 043 aspiration catheter. The micro guidewire was removed followed by aspiration at the hub of the 043 distal aspiration catheter and also the 071 Zoom aspiration catheter. After a minute, the inner 043 aspiration catheter was removed followed by the 071 aspiration catheter 2 minutes later. A control arteriogram performed through the Zoom support catheter demonstrated revascularization of the right M1 segment and the superior division into the distal M2 segments. The inferior division remained occluded. A second pass was then made using the combination of an 021 162 cm microcatheter advanced inside of an 062 Penumbra aspiration catheter. Over an 018 inch Aristotle micro guidewire, the combination was advanced without difficulty to the M1 segment. The micro guidewire was maneuvered into the inferior division distal M2 region followed by the microcatheter. The guidewire was removed. Good aspiration obtained from the hub of the microcatheter which was then connected to continuous heparinized saline infusion. A 4 mm x 40 mm Solitaire X retrieval device was then deployed in the usual manner. The 062 aspiration catheter was advanced into the proximal inferior division. Aspiration was applied at the hub of the 062 aspiration catheter for approximately 3 minutes. Thereafter the combination was retrieved and removed. A control angiogram performed through the 088 Zoom aspiration catheter demonstrated complete revascularization of the superior division and also the inferior division to the distal M2 region. A TICI 2B plus revascularization was achieved. Distal branch of the inferior division extending into the posterior frontal anterior parietal region remained occluded. Given the distal nature of the occlusion, aspiration was not attempted. A final control arteriogram performed through the 088 Zoom catheter in the distal right common  carotid artery revealed patency of the intracranial and extracranial portion of the right ICA. The right anterior cerebral artery remained widely patent. The right middle cerebral artery continued to demonstrate a TICI 2B plus revascularization. An 8 French Angio-Seal closure device was deployed at the right groin puncture site. Distal pulses remained present unchanged from prior to the procedure. A flat panel CT of the brain revealed no gross intracranial hemorrhage. The patient was left intubated due to his medical condition and transferred to the neuro ICU for post revascularization care. IMPRESSION: Status post endovascular complete revascularization of the right middle cerebral M1 segment, of the superior division, and partially the inferior division achieving a TICI 2B plus revascularization. PLAN: As per Stroke Neurology. Electronically Signed   By: Thyra Nash M.D.   On: 10/12/2023 08:20   IR CT Head Ltd Result Date: 10/12/2023 INDICATION: New onset right gaze deviation, left-sided hemiplegia. Occluded right middle cerebral artery M1 segment on CT angiogram of the head and neck. EXAM: 1. EMERGENT LARGE VESSEL OCCLUSION THROMBOLYSIS (anterior CIRCULATION) COMPARISON:  CT angiogram of the head and neck of October 07, 2023. MEDICATIONS: No antibiotic was administered within 1 hour of the procedure. ANESTHESIA/SEDATION: General anesthesia. CONTRAST:  Omnipaque  300 approximately 115 cc.  FLUOROSCOPY TIME:  Fluoroscopy Time: 41 minutes 24 seconds (29 mGy). COMPLICATIONS: None immediate. TECHNIQUE: Following a full explanation of the procedure along with the potential associated complications, an informed witnessed consent was obtained. The risks of intracranial hemorrhage of 10%, worsening neurological deficit, ventilator dependency, death and inability to revascularize were all reviewed in detail with the patient's daughter. The patient was then put under general anesthesia by the Department of  Anesthesiology at [facility]. The right groin was prepped and draped in the usual sterile fashion. Thereafter using modified Seldinger technique, transfemoral access into the right common femoral artery was obtained without difficulty. Over an 0.035 inch guidewire an 8 French 25 cm [place] Pinnacle sheath was inserted. Through this, and also over an 0.035 inch guidewire a combination of a 125 cm 6 French Simmons 2 support catheter inside of a 100 cm 088 Zoom catheter was advanced to the aortic arch region, and selectively positioned in the right common carotid artery and then the distal right internal carotid artery in the proximal cavernous segment. FINDINGS: Right common carotid arteriogram demonstrates the right external carotid artery and its major branches to be widely patent. The right internal carotid artery at the bulb to the cranial skull base demonstrates patency with moderate to moderately severe tortuosity in the proximal 1/3. The petrous, the cavernous and the supraclinoid right ICA demonstrate wide patency with a double U shaped tortuosity of the petrous and cavernous segments extending into the distal cavernous segment. Complete occlusion was evident of the right middle cerebral artery in the proximal M1 segment. Right anterior cerebral artery opacifies into the capillary and venous phases. PROCEDURE: Through the 088 support catheter in the petrous cavernous junction, the combination of an 071 135 cm Zoom aspiration catheter with an 043 Penumbra distal aspiration catheter was advanced over an 018 inch Aristotle micro guidewire with a moderate J configuration. The combination was navigated with modest difficulty due to the inherent tortuosity to the caval cavernous segment. The micro guidewire was then advanced into the right M1 segment followed by the 043 aspiration catheter followed by the 071 aspiration catheter which could only be advanced to just the origin of the right middle cerebral  artery due to the severe tortuosity. The micro guidewire was then advanced into the M2 segment of the superior division followed by the 043 aspiration catheter. The micro guidewire was removed followed by aspiration at the hub of the 043 distal aspiration catheter and also the 071 Zoom aspiration catheter. After a minute, the inner 043 aspiration catheter was removed followed by the 071 aspiration catheter 2 minutes later. A control arteriogram performed through the Zoom support catheter demonstrated revascularization of the right M1 segment and the superior division into the distal M2 segments. The inferior division remained occluded. A second pass was then made using the combination of an 021 162 cm microcatheter advanced inside of an 062 Penumbra aspiration catheter. Over an 018 inch Aristotle micro guidewire, the combination was advanced without difficulty to the M1 segment. The micro guidewire was maneuvered into the inferior division distal M2 region followed by the microcatheter. The guidewire was removed. Good aspiration obtained from the hub of the microcatheter which was then connected to continuous heparinized saline infusion. A 4 mm x 40 mm Solitaire X retrieval device was then deployed in the usual manner. The 062 aspiration catheter was advanced into the proximal inferior division. Aspiration was applied at the hub of the 062 aspiration catheter for approximately 3 minutes. Thereafter the combination  was retrieved and removed. A control angiogram performed through the 088 Zoom aspiration catheter demonstrated complete revascularization of the superior division and also the inferior division to the distal M2 region. A TICI 2B plus revascularization was achieved. Distal branch of the inferior division extending into the posterior frontal anterior parietal region remained occluded. Given the distal nature of the occlusion, aspiration was not attempted. A final control arteriogram performed through the 088  Zoom catheter in the distal right common carotid artery revealed patency of the intracranial and extracranial portion of the right ICA. The right anterior cerebral artery remained widely patent. The right middle cerebral artery continued to demonstrate a TICI 2B plus revascularization. An 8 French Angio-Seal closure device was deployed at the right groin puncture site. Distal pulses remained present unchanged from prior to the procedure. A flat panel CT of the brain revealed no gross intracranial hemorrhage. The patient was left intubated due to his medical condition and transferred to the neuro ICU for post revascularization care. IMPRESSION: Status post endovascular complete revascularization of the right middle cerebral M1 segment, of the superior division, and partially the inferior division achieving a TICI 2B plus revascularization. PLAN: As per Stroke Neurology. Electronically Signed   By: Thyra Nash M.D.   On: 10/12/2023 08:20   DG Chest Port 1 View Result Date: 10/12/2023 CLINICAL DATA:  Acute respiratory failure with hypoxemia. EXAM: PORTABLE CHEST 1 VIEW COMPARISON:  10/09/2023 FINDINGS: Patient is slightly rotated to the right. Interval removal endotracheal tube. Two enteric tubes course into the region of the stomach and off the film as tips are not definitely visualized. Lungs are hypoinflated with continued mild hazy opacification over the left base with slight interval improved aeration. Mild prominence of the central pulmonary vessels suggesting a degree of vascular congestion without significant change. Stable cardiomegaly. Remainder of the exam is unchanged. IMPRESSION: 1. Interval removal of endotracheal tube. 2. Hypoinflation with continued mild hazy opacification over the left base with slight interval improved aeration. 3. Stable cardiomegaly with mild vascular congestion. Electronically Signed   By: Toribio Agreste M.D.   On: 10/12/2023 08:16   DG Abd Portable 1V Result Date:  10/12/2023 CLINICAL DATA:  Check gastric catheter placement EXAM: PORTABLE ABDOMEN - 1 VIEW COMPARISON:  10/11/2023 FINDINGS: Previously seen feeding catheter is again noted. Gastric catheter is now seen within the distal stomach as well. Scattered large and small bowel gas remains consistent with ileus. IMPRESSION: Gastric catheter within the distal stomach. Electronically Signed   By: Oneil Devonshire M.D.   On: 10/12/2023 02:41   DG Abd 1 View Result Date: 10/11/2023 CLINICAL DATA:  Check feeding catheter placement, follow-up ileus EXAM: ABDOMEN - 1 VIEW COMPARISON:  None Available. FINDINGS: Feeding catheter is noted in the distal stomach directed towards the duodenum. Diffuse gaseous distension of the large and small bowel is noted likely representing a generalized ileus. No free air is seen. No bony abnormality is noted. IMPRESSION: Changes consistent with generalized ileus. Feeding catheter is noted in the distal stomach. Electronically Signed   By: Oneil Devonshire M.D.   On: 10/11/2023 23:35   IR PATIENT EVAL TECH 0-60 MINS Result Date: 10/10/2023 Janice Lynwood BROCKS     10/10/2023  1:38 PM Pt had pressure held by Laymon Ellison for 10 min at the groin site and then had a new dressing applied to the groin site at this time on 7/25.   DG Chest Port 1 View Result Date: 10/09/2023 CLINICAL DATA:  Acute respiratory failure  EXAM: PORTABLE CHEST 1 VIEW COMPARISON:  10/08/2023 FINDINGS: Endotracheal tube and NG tube remain in place, unchanged. Cardiomegaly, vascular congestion. Increasing right infrahilar airspace opacity. Stable left retrocardiac opacity. No effusions or acute bony abnormality. IMPRESSION: Bilateral lower lobe airspace opacities, increasing on the right since prior study could reflect atelectasis or infiltrates/pneumonia. Cardiomegaly, vascular congestion stable. Electronically Signed   By: Franky Crease M.D.   On: 10/09/2023 15:34   CT HEAD WO CONTRAST ( ) Result Date: 10/09/2023 EXAM: CT HEAD  WITHOUT CONTRAST 10/09/2023 08:06:27 AM TECHNIQUE: CT of the head was performed without the administration of intravenous contrast. Automated exposure control, iterative reconstruction, and/or weight based adjustment of the mA/kV was utilized to reduce the radiation dose to as low as reasonably achievable. COMPARISON: None available. CLINICAL HISTORY: Stroke, follow up. Non con. Principal Problem: Acute ischemic stroke (HCC). Chief complaints: Code Stroke. FINDINGS: BRAIN AND VENTRICLES: Expected evolution of the large right MCA territory infarct is noted. Effacement of the sulci is present. No significant midline shift is present. Hyperdense thrombus is present within sylvian branches of the right MCA. No acute left-sided abnormality is present. ORBITS: No acute abnormality. SINUSES: No acute abnormality. SOFT TISSUES AND SKULL: No acute soft tissue abnormality. No skull fracture. IMPRESSION: 1. Expected evolution of the large right MCA territory infarct with effacement of the sulci and hyperdense thrombus within sylvian branches of the right MCA. No significant midline shift. 2. No acute left-sided abnormality. Electronically signed by: Lonni Necessary MD 10/09/2023 08:29 AM EDT RP Workstation: [id number]   MR BRAIN WO CONTRAST Result Date: 10/08/2023 EXAM: MRI BRAIN WITHOUT CONTRAST 10/08/2023 12:38:36 PM TECHNIQUE: Multiplanar multisequence MRI of the head/brain was performed without the administration of intravenous contrast. COMPARISON: CT head without contrast 10/07/2023. CLINICAL HISTORY: Stroke, follow up. FINDINGS: BRAIN AND VENTRICLES: A large right MCA (middle cerebral artery) territory infarct is present. The right caudate head and lentiform nucleus are infarcted. Insular ribbon and ganglionic and supraganglionic cortical infarcts are noted. A focal area of cortical infarction is present in the medial anterior right frontal lobe. No focal hemorrhage is present. Diffuse T2 and FLAIR  hyperintensities are present throughout the infarcted area. Additional periventricular T2 hyperintensities are moderately advanced for age. Diffuse cortical thickening is present with some mass effect but no significant midline shift. ORBITS: No acute abnormality. SINUSES AND MASTOIDS: Fluid levels are present in the posterior ethmoid air cells, the right maxillary sinus and bilateral sphenoid sinuses. Fluid is present in the nasopharynx. BONES AND SOFT TISSUES: Normal marrow signal. No acute soft tissue abnormality. IMPRESSION: 1. Large right MCA territory infarct with associated diffuse cortical thickening and some mass effect, but no significant midline shift. No focal hemorrhage. 2. Moderately advanced periventricular T2 hyperintensities for age. This likely reflects sequela of chronic microvascular ischemia. 3. Diffuse sinus disease likely related to intubated status. Electronically signed by: Lonni Necessary MD 10/08/2023 12:59 PM EDT RP Workstation: [id number]   MR ANGIO HEAD WO CONTRAST Result Date: 10/08/2023 EXAM: MR Angiography Head without intravenous Contrast. 10/08/2023 12:38:20 PM TECHNIQUE: Magnetic resonance angiography images of the head without intravenous contrast. Multiplanar 2D and 3D reformatted images are provided for review. COMPARISON: Cerebral arteriogram 10/07/2023. CLINICAL HISTORY: Stroke, follow up. Interval revascularization of the right middle cerebral artery. FINDINGS: ANTERIOR CIRCULATION: No significant stenosis of the internal carotid arteries. No significant stenosis of the anterior cerebral arteries. A distal right M2 occlusion is somewhat more proximal than on the final angio images. No new occlusion is present. No aneurysm. POSTERIOR CIRCULATION: No  significant stenosis of the posterior cerebral arteries. No significant stenosis of the basilar artery. No significant stenosis of the vertebral arteries. No aneurysm. IMPRESSION: 1. Distal right M2 occlusion, somewhat more  proximal than on the final angio images. No new occlusion is present. Electronically signed by: Lonni Necessary MD 10/08/2023 12:55 PM EDT RP Workstation: [id number]   DG Chest Port 1 View Result Date: 10/08/2023 CLINICAL DATA:  Acute respiratory failure. EXAM: PORTABLE CHEST 1 VIEW COMPARISON:  Chest radiograph dated 10/07/2023. FINDINGS: Endotracheal tube approximately 4.5 cm above the carina. Enteric tube extends below diaphragm with tip beyond the inferior margin of the image. There is shallow inspiration. Cardiomegaly with vascular congestion. Left lung base atelectasis or infiltrate. No pneumothorax. No acute osseous pathology. IMPRESSION: 1. Endotracheal tube above the carina. 2. Cardiomegaly with vascular congestion. 3. Left lung base atelectasis or infiltrate. Electronically Signed   By: Vanetta Chou M.D.   On: 10/08/2023 11:19   ECHOCARDIOGRAM COMPLETE Result Date: 10/07/2023    ECHOCARDIOGRAM REPORT   Patient Name:   AVIYON HOCEVAR Date of Exam: 10/07/2023 Medical Rec #:  [id number]      Height:       72.0 in Accession #:    [id number]     Weight:       317.5 lb Date of Birth:  1975-04-20     BSA:          2.593 m Patient Age:    47 years       BP:           126/81 mmHg Patient Gender: M              HR:           64 bpm. Exam Location:  Inpatient Procedure: 2D Echo, Cardiac Doppler and Color Doppler (Both Spectral and Color            Flow Doppler were utilized during procedure). Indications:    Stroke i63.9  History:        Patient has prior history of Echocardiogram examinations, most                 recent 04/12/2019. CHF, ESRD; Risk Factors:Hypertension.  Sonographer:    Damien Senior RDCS Referring Phys: [id number] CORTNEY E DE LA TORRE  Sonographer Comments: Bubble study attempted 2x IMPRESSIONS  1. Left ventricular ejection fraction, by estimation, is 55 to 60%. The left ventricle has normal function. The left ventricle has no regional wall motion abnormalities. There is severe concentric  left ventricular hypertrophy. Left ventricular diastolic  parameters are consistent with Grade II diastolic dysfunction (pseudonormalization).  2. Right ventricular systolic function is normal. The right ventricular size is severely enlarged. Tricuspid regurgitation signal is inadequate for assessing PA pressure.  3. Left atrial size was mildly dilated.  4. Right atrial size was severely dilated.  5. The mitral valve is normal in structure. Trivial mitral valve regurgitation.  6. The aortic valve is tricuspid. Aortic valve regurgitation is not visualized.  7. There is mild dilatation of the ascending aorta, measuring 41 mm.  8. Bubble study is nondiagnostic. FINDINGS  Left Ventricle: Left ventricular ejection fraction, by estimation, is 55 to 60%. The left ventricle has normal function. The left ventricle has no regional wall motion abnormalities. The left ventricular internal cavity size was normal in size. There is  severe concentric left ventricular hypertrophy. Left ventricular diastolic parameters are consistent with Grade II diastolic dysfunction (pseudonormalization). Right Ventricle: The right ventricular size is severely  enlarged. No increase in right ventricular wall thickness. Right ventricular systolic function is normal. Tricuspid regurgitation signal is inadequate for assessing PA pressure. Left Atrium: Left atrial size was mildly dilated. Right Atrium: Right atrial size was severely dilated. Pericardium: There is no evidence of pericardial effusion. Mitral Valve: The mitral valve is normal in structure. Trivial mitral valve regurgitation. Tricuspid Valve: The tricuspid valve is normal in structure. Tricuspid valve regurgitation is trivial. Aortic Valve: The aortic valve is tricuspid. Aortic valve regurgitation is not visualized. Pulmonic Valve: The pulmonic valve was normal in structure. Pulmonic valve regurgitation is trivial. Aorta: The aortic root is normal in size and structure. There is mild  dilatation of the ascending aorta, measuring 41 mm. Venous: IVC assessment for right atrial pressure unable to be performed due to mechanical ventilation. IAS/Shunts: The interatrial septum was not well visualized. Agitated saline contrast was given intravenously to evaluate for intracardiac shunting. Bubble study is nondiagnostic.  LEFT VENTRICLE PLAX 2D LVIDd:         4.30 cm   Diastology LVIDs:         3.10 cm   LV e' medial:    5.00 cm/s LV PW:         1.80 cm   LV E/e' medial:  12.8 LV IVS:        2.20 cm   LV e' lateral:   5.55 cm/s LVOT diam:     2.00 cm   LV E/e' lateral: 11.5 LV SV:         63 LV SV Index:   24 LVOT Area:     3.14 cm  RIGHT VENTRICLE RV S prime:     13.60 cm/s TAPSE (M-mode): 2.2 cm LEFT ATRIUM             Index        RIGHT ATRIUM           Index LA diam:        5.30 cm 2.04 cm/m   RA Area:     34.60 cm LA Vol (A2C):   97.5 ml 37.60 ml/m  RA Volume:   125.00 ml 48.21 ml/m LA Vol (A4C):   88.6 ml 34.17 ml/m LA Biplane Vol: 99.7 ml 38.45 ml/m  AORTIC VALVE LVOT Vmax:   91.40 cm/s LVOT Vmean:  68.000 cm/s LVOT VTI:    0.200 m  AORTA Ao Root diam: 3.30 cm Ao Asc diam:  4.10 cm MITRAL VALVE MV Area (PHT): 3.12 cm    SHUNTS MV Decel Time: 243 msec    Systemic VTI:  0.20 m MV E velocity: 63.90 cm/s  Systemic Diam: 2.00 cm MV A velocity: 59.30 cm/s MV E/A ratio:  1.08 Morene Brownie Electronically signed by Morene Brownie Signature Date/Time: 10/07/2023/3:29:45 PM    Final    DG CHEST PORT 1 VIEW Result Date: 10/07/2023 CLINICAL DATA:  Encounter for intubation EXAM: PORTABLE CHEST 1 VIEW COMPARISON:  October 07, 2023 at 9:33:54 a.m. FINDINGS: Endotracheal tube terminates 1.7 cm proximal to carina. Enteric tube 2 is in place extending below left hemidiaphragm tip outside of the field of view. Cardiomegaly, stable to prior accentuated by low lung volume. There are patchy opacities involving bilateral perihilar lungs increase in right lung apex. Bilateral vascular congestion. Blunting of  left costophrenic angle. No pleural effusion on the right. No acute osseous abnormality. IMPRESSION: Endotracheal tube tip 1.7 cm proximal to carina. Increased patchy opacities of bilateral perihilar and right hemithorax may represent pulmonary edema or  airspace consolidations. Electronically Signed   By: Megan  Zare M.D.   On: 10/07/2023 14:07   DG Chest Portable 1 View Result Date: 10/07/2023 CLINICAL DATA:  ETT Placement EXAM: PORTABLE CHEST 1 VIEW COMPARISON:  Same day chest radiograph dated 10/07/2023 at 9:25 a.m. FINDINGS: Interval retraction of the endotracheal tube which now terminates approximately 4.3 cm above the carina. Enteric tube courses below the left hemidiaphragm, beyond the field of view. Mild left basilar atelectasis. No other significant change compared to the prior exam. Persistent low lung volumes with accentuation of the cardiac silhouette and bronchovascular crowding. IMPRESSION: 1. Interval retraction of the endotracheal tube which now terminates approximately 4.3 cm above the carina. 2. Mild left basilar atelectasis. Electronically Signed   By: Harrietta Sherry M.D.   On: 10/07/2023 09:49   DG Chest Portable 1 View Result Date: 10/07/2023 CLINICAL DATA:  post intubation EXAM: PORTABLE CHEST 1 VIEW COMPARISON:  09/14/2023. FINDINGS: Endotracheal tube tip terminates in the proximal right mainstem bronchus, retraction by at least 4 cm is recommended. Enteric tube tip and side port in the stomach. Low lung volumes with accentuation of the cardiac silhouette and bronchovascular crowding. No sizable pleural effusion or pneumothorax. No acute osseous abnormality identified. IMPRESSION: 1. Endotracheal tube tip terminates in the proximal right mainstem bronchus, retraction by at least 4 cm is recommended. 2.  Enteric tube tip and side port in the stomach. Electronically Signed   By: Harrietta Sherry M.D.   On: 10/07/2023 09:47   CT HEAD CODE STROKE WO CONTRAST Result Date:  10/07/2023 EXAM: CT HEAD WITHOUT 10/07/2023 08:41:14 AM TECHNIQUE: CT of the head was performed without the administration of intravenous contrast. Automated exposure control, iterative reconstruction, and/or weight based adjustment of the mA/kV was utilized to reduce the radiation dose to as low as reasonably achievable. COMPARISON: None available. CLINICAL HISTORY: Neuro deficit, acute, stroke suspected. Lkw 5am, l/s paralysis, slurred speech; [place] [phone] FINDINGS: BRAIN AND VENTRICLES: Decreased gray-white differentiation is noted along the right insular cortex and right lentiform nucleus. Decreased density is present within the right caudate head. No acute hemorrhage or mass lesion is present. ORBITS: No acute abnormality. SINUSES AND MASTOIDS: No acute abnormality. SOFT TISSUES AND SKULL: No acute skull fracture. No acute soft tissue abnormality. VASCULATURE: Calcific density is noted in the proximal right M1 segment. Atherosclerotic calcifications are present within the cavernous internal carotid arteries. [place] stroke program early CT (ASPECT) score ----- Ganglionic (caudate, IC, lentiform nucleus, insula, M1-M3): 4 Supraganglionic (M4-M6): 3 Total: 7 IMPRESSION: 1. Findings consistent with acute right MCA territory ischemic changes, ASPECT score 7. 2. And calcific density is noted in the proximal right M1 segment. This is most concerning for a calcific embolus. Intracranial atherosclerotic changes considered less likely. 3. No acute hemorrhage. Critical findings were communicated to Dr. Merrianne via tracey at 8:50 am Electronically signed by: Lonni Necessary MD 10/07/2023 08:51 AM EDT RP Workstation: [id number]       HISTORY OF PRESENT ILLNESS 48 y.o. patient with history of obesity, hypertension, end-stage renal disease on HD TTS, combined systolic and diastolic heart failure who presented to the emergency department on 7/25 with left-sided weakness, slurred speech. NIH on Admission 13. IV TNK  after CT head showed what looked like a calcific embolus in the right MCA and a good ASPECT score and CT angiogram could not be obtained with emergent cerebral catheter angiogram showed right M1 occlusion and he underwent TICI 2 b revascularization of the superior and inferior division to the  M2 region but was left intubated for medical issues given history of CHF and renal failure.   HOSPITAL COURSE Stroke:  right MCA large infarct with right M1 occlusion s/p TNK and IR with TICI2b, etiology: Likely large vessel disease given calcified plaque Code Stroke CT head - Findings consistent with acute right MCA territory ischemic changes, ASPECT score 7. And calcific density is noted in the proximal right M1 segment. This is most concerning for a calcific embolus. Intracranial atherosclerotic changes considered less likely. S/p IR with right M1 occlusion achieved TICI2b reperfusion MRI   Large right MCA territory infarct with associated diffuse cortical thickening and some mass effect MRA  Distal right M2 occlusion, somewhat more proximal than on the final angio images. No new occlusion is present. Repeat CT Head-  Expected evolution of the large right MCA territory infarct with effacement of the sulci and hyperdense thrombus within sylvian branches of the right MCA. No significant midline shift. 2D Echo EF 50-55% UDS negative No antithrombotic prior to admission, now on aspirin  81 and Plavix  75 DAPT for 3 months and then ASA alone. Therapy recommendations: CIR  Acute hypoxic and hypercapnic respiratory failure Intubated for procedure Postprocedure extubated to high flow nasal cannula Now tolerating well, transferred out of ICU On [place] 3LPM  CXR Vague lingular and left lower lobe airspace opacification    Combined systolic and diastolic heart failure Hypertension Home meds: Amlodipine , isosorbide  mononitrate, furosemide , carvedilol  Now on Norvasc , Coreg  and isosorbide  BP stable Long-term BP goal  normotensive   Hyperlipidemia Home meds none LDL 85 goal < 70 Elevated TG [phone] Add atorvastatin  40 Continue statin at discharge   Dysphagia Now still NPO able to have ice chip with full supervision Now on dys 1 diet with nectar thick liquid.  decrease tube feeding @ 35 with water  flush  Discussed with CIR NP, will consult dietitian to see if TF still needed   ESRD on HD Nephrology consulted BUN 139, Cr 18.98 HD Tuesday Thursday Saturday   Hyperphospatemia  Hyperkalemia  Phos > 30, K 5.8   Other stroke risk factors Obesity, BMI 39.77   Other acute medical issues Leukocytosis WBC 13.6--15.8--16.2--16.0-12.3-11.3, afebrile Back pain - Tylenol  and Flexeril  as needed Chronic anemia  hgb 10.9 stable     DISCHARGE EXAM  PHYSICAL EXAM General: Obese middle-aged African-American male.   CV: Regular rate and rhythm on monitor Respiratory: Respirations regular and unlabored on room air GI: Abdomen soft and nontender     NEURO: awake and alert. pt orientated to age, place, time. No aphasia but paucity of speech, following all simple commands, dysarthria. Able to name and repeat in dysarthric voice. No gaze palsy, tracking bilaterally, visual field testing not quite cooperative, keep turning eyes on the left to count fingers, PERRL. Left mild facial droop. Tongue midline. RUE and RLE 5/5. LUE 0/5 and LLE proximal 3-/5 and distal DF 4/5. Sensation symmetrical bilaterally subjectively, R FTN intact, gait not tested.      Discharge Diet       Diet   DIET - DYS 1 Room service appropriate? No; Fluid consistency: Nectar Thick   liquids  DISCHARGE PLAN Disposition: Rehab aspirin  81 mg daily and clopidogrel  75 mg daily for secondary stroke prevention for 3 months then aspirin  81 mg daily alone. Ongoing stroke risk factor control by Primary Care Physician at time of discharge Follow-up PCP Pcp, No in 2 weeks. Follow-up in [facility] in 8  weeks, office to schedule an appointment.  Able to see NP in clinic.  50 minutes were spent preparing discharge.  Karna Geralds DNP, ACNPC-AG  [facility]  ATTENDING NOTE: I reviewed above note and agree with the assessment and plan. Pt was seen and examined.   No family at bedside.  Patient reclining in bed, AO x 3, still has dysarthria but no aphasia, follows some commands, still has left arm flaccid, however left lower extremity 4/5, able to stand and walk with assistance with PT.  Head hemodialysis this morning.  On DAPT and statin.  Pending swallow, on diet, will consult dietitian to see better tube feeding needed.  Medically ready for CIR placement.  Smoking cessation education again provided.  Follow-up with [facility].  For detailed assessment and plan, please refer to above as I have made changes wherever appropriate.   Ary Cummins, MD PhD Stroke Neurology 10/18/2023 6:19 PM

## 2023-10-18 NOTE — H&P (Signed)
 Physical Medicine and Rehabilitation Admission H&P    Chief Complaint  Patient presents with   Functional deficits due to debility      HPI: Nathan Campbell is a 48 year old male with a significant past medical history, including systolic and diastolic congestive heart failure, anemia, HTN, obesity, and end-stage renal disease on hemodialysis TTHS. Presented to [facility] on 10/07/2023 after experiencing an acute onset of left-sided weakness, left side neglect, dysarthria and right gaze preference.  The symptoms began earlier in the morning, and the patient presented with emergency medical services as a code stroke. Notably, the patient had been compliant with his hemodialysis treatment prior to this period. In the emergency department vitals remained stable. He was intubated for airway protection and because of agitation and restlessness. A chest X-ray showed no significant disease. Intravenous Cleviprex  was initiated. Initial CT head revealed right MCA territory ischemic changes with calcific density in proximal right M1 concerning for calcific embolus.  The patient was administered TNK, and IR was consulted and Dr. Dolphus performed a cerebral angiogram which revealed a right M1 occlusion. Subsequently, the patient underwent a thrombectomy of the right M1, achieving TICI 2B revascularization of the occluded right middle cerebral artery M1 segment, including the superior and inferior divisions to the right M2 region. An MRI demonstrated a large right MCA territory CVA with associated diffuse cortical thickening and some mass effect but no significant midline shift. MRA revealed distal right M2 occlusion but no other new occlusions. He was  extubated on 10/11/2023. On 7/30, the patient vomited and aspirated, leading to the placement of a nasogastric tube. An abdominal X-ray indicated ileus.  Echocardiogram showed an ejection fraction of 50-55%. Prior to admission, the patient was not on any  antithrombotics but was now placed on Aspirin  81mg  and Plavix  75mg , with DAPT recommended for three months followed by Aspirin  alone. Speech therapy was consulted, and a modified barium swallow study was conducted. The patient was advised to follow a dysphagia level 1 pureed diet with nectar-thick liquids. Patient was independent and working prior to admission. He lives in a one level apartment. Currently requiring max-mod assistance for ADL's and mod assist +2 for transfers and mobility. Therapy evaluations completed due to patient decreased functional mobility was admitted for a comprehensive rehab program.    Review of Systems  Constitutional:  Positive for malaise/fatigue.  Eyes: Negative.  Negative for double vision.  Respiratory:  Positive for cough. Negative for sputum production.   Cardiovascular:  Negative for chest pain.  Gastrointestinal:  Positive for constipation. Negative for abdominal pain.  Genitourinary: Negative.   Musculoskeletal:  Positive for myalgias.  Skin:  Positive for itching.       generalized itching   Neurological:  Positive for sensory change, speech change, focal weakness and weakness. Negative for dizziness, tingling, tremors and headaches.   Past Medical History:  Diagnosis Date   Acute combined systolic and diastolic CHF, NYHA class 4 (HCC) 06/10/2016   Anemia    Cardiomyopathy (HCC) 06/14/2016   CHF (congestive heart failure) (HCC)    COVID 10/2020   Dyspnea    ESRD on hemodialysis (HCC)    TTS at [facility]   Hypertension    Hypertensive heart and kidney disease with heart failure (HCC) 06/14/2016   Hypertensive heart disease with congestive heart failure (HCC)    Obesity    Past Surgical History:  Procedure Laterality Date   A/V FISTULAGRAM Left 05/03/2022   Procedure: A/V Fistulagram;  Surgeon: Eliza Lonni RAMAN, MD;  Location: [facility] INVASIVE CV LAB;  Service: Cardiovascular;  Laterality: Left;   AV FISTULA PLACEMENT Left 11/11/2020    Procedure: LEFT ARM First Stage Basilic Vein Fistula Creation;  Surgeon: Sheree Penne Lonni, MD;  Location: [facility] OR;  Service: Vascular;  Laterality: Left;   BASCILIC VEIN TRANSPOSITION Left 01/23/2021   Procedure: Second Stage Basilic Vein Transposition of Left Arm Arteriovenous  Fistula;  Surgeon: Sheree Penne Lonni, MD;  Location: [facility] OR;  Service: Vascular;  Laterality: Left;  Block  to LMA    COLONOSCOPY     FISTULA SUPERFICIALIZATION Left 05/07/2022   Procedure: PLICATION OF ANEURYSM, LEFT ARM FISTULA;  Surgeon: Eliza Lonni RAMAN, MD;  Location: [facility] OR;  Service: Vascular;  Laterality: Left;   IR CT HEAD LTD  10/07/2023   IR CT HEAD LTD  10/07/2023   IR PATIENT EVAL TECH 0-60 MINS  10/07/2023   IR PERCUTANEOUS ART THROMBECTOMY/INFUSION INTRACRANIAL INC DIAG ANGIO  10/07/2023   NO PAST SURGERIES     PERIPHERAL VASCULAR BALLOON ANGIOPLASTY Left 05/03/2022   Procedure: PERIPHERAL VASCULAR BALLOON ANGIOPLASTY;  Surgeon: Eliza Lonni RAMAN, MD;  Location: [facility] INVASIVE CV LAB;  Service: Cardiovascular;  Laterality: Left;  AVF   RADIOLOGY WITH ANESTHESIA N/A 10/07/2023   Procedure: RADIOLOGY WITH ANESTHESIA;  Surgeon: Radiologist, Medication, MD;  Location: [facility] OR;  Service: Radiology;  Laterality: N/A;   Family History  Problem Relation Age of Onset   Hypertension Mother    Diabetes Father    Hypertension Father    Hypertension Sister    Hypertension Paternal Grandmother    Diabetes Mellitus II Paternal Grandfather    Heart disease Cousin    Heart disease Cousin    Colon cancer Cousin    Esophageal cancer Neg Hx    Rectal cancer Neg Hx    Stomach cancer Neg Hx    Social History:  reports that he has been smoking cigarettes. He has never used smokeless tobacco. He reports that he does not currently use alcohol. He reports that he does not currently use drugs. Allergies: No Known Allergies Facility-Administered Medications Prior to Admission  Medication Dose Route Frequency  Provider Last Rate Last Admin   0.9 %  sodium chloride  infusion  250 mL Intravenous PRN Sheree Penne Lonni, MD   500 mL at 10/07/23 1044   sodium chloride  flush (NS) 0.9 % injection 3 mL  3 mL Intravenous Q12H Sheree Penne Lonni, MD       Medications Prior to Admission  Medication Sig Dispense Refill   acetaminophen  (TYLENOL ) 325 MG tablet Take 2 tablets (650 mg total) by mouth every 6 (six) hours as needed for moderate pain. 30 tablet 0   allopurinol  (ZYLOPRIM ) 100 MG tablet Take 1 tablet (100 mg total) by mouth daily. Do not begin until pain in right knee is resolved. 30 tablet 1   amLODipine  (NORVASC ) 10 MG tablet Take 1 tablet (10 mg total) by mouth daily. 30 tablet 1   AURYXIA  1 GM 210 MG(Fe) tablet Take 420-630 mg by mouth See admin instructions. Take 630 mg with each meal and 420 to 630 mg with each snack     B Complex-C-Zn-Folic Acid (DIALYVITE 800/ZINC PO) Take 1 tablet by mouth daily.     benzonatate  (TESSALON ) 100 MG capsule Take 1 capsule (100 mg total) by mouth 3 (three) times daily as needed for cough. 30 capsule 0   carvedilol  (COREG ) 25 MG tablet Take 25 mg by mouth 2 (two)  times daily.     guaiFENesin -dextromethorphan (ROBITUSSIN DM) 100-10 MG/5ML syrup Take 5 mLs by mouth 3 (three) times daily as needed for cough. 118 mL 0   isosorbide  mononitrate (ISMO ) 10 MG tablet Take 10 mg by mouth daily.     omeprazole  (PRILOSEC) 20 MG capsule Take 1 capsule (20 mg total) by mouth daily. Take after dialysis on dialysis days 30 capsule 0   promethazine -dextromethorphan (PROMETHAZINE -DM) 6.25-15 MG/5ML syrup Take 10 mLs by mouth every 6 (six) hours as needed for cough. 118 mL 0   torsemide  (DEMADEX ) 20 MG tablet Take 60 mg by mouth 2 (two) times daily.        Home: Home Living Family/patient expects to be discharged to:: Private residence Living Arrangements: Alone Available Help at Discharge:  (unknown) Type of Home: Apartment Home Access: Stairs to enter Entrance  Stairs-Number of Steps: 1 flight Entrance Stairs-Rails: Right, Left Home Layout: One level Home Equipment: None  Lives With:  (unknown)   Functional History: Prior Function Prior Level of Function : Independent/Modified Independent, Driving Mobility Comments: no use of DME ADLs Comments: independent, driving himself to HD  Functional Status:  Mobility: Bed Mobility Overal bed mobility: Needs Assistance Bed Mobility: Rolling, Sidelying to Sit Rolling: Contact guard assist Sidelying to sit: Contact guard assist, HOB elevated Supine to sit: Total assist, HOB elevated Sit to sidelying: Mod assist, +2 for safety/equipment General bed mobility comments: Pt sitting EOB with OT at beginning of session Transfers Overall transfer level: Needs assistance Equipment used: Quad cane Transfers: Sit to/from Stand Sit to Stand: Min assist, +2 safety/equipment Bed to/from chair/wheelchair/BSC transfer type:: Step pivot Step pivot transfers: Mod assist General transfer comment: STS from EOB and recliner with cues for hand placement and min A for power up Ambulation/Gait Ambulation/Gait assistance: Min assist, +2 safety/equipment Gait Distance (Feet): 15 Feet (+10) Assistive device: Quad cane Gait Pattern/deviations: Trunk flexed, Step-through pattern, Narrow base of support, Step-to pattern, Decreased stride length, Decreased step length - left General Gait Details: Pt demonstrates short L step length and flexed trunk requiring cues for increased step length and upright posture. Dense cues for sequencing with quad cane with pt demonstrating slightly improved recall with increased time for processing at end of trial. Cues for slowed pacing for safety due to increased instability. Chair follow for safety with pt requiring 1 seated rest break Gait velocity interpretation: <1.8 ft/sec, indicate of risk for recurrent falls    ADL: ADL Overall ADL's : Needs assistance/impaired Eating/Feeding:  NPO Grooming: Sitting, Oral care, Wash/dry face, Moderate assistance Grooming Details (indicate cue type and reason): needs assist for bimanual tasks, min assist for safety with oral care using suction toothbrush Upper Body Bathing: Maximal assistance, Sitting Lower Body Bathing: Maximal assistance, Bed level Upper Body Dressing : Moderate assistance, Sitting Lower Body Dressing: Maximal assistance, Sit to/from stand, Bed level Lower Body Dressing Details (indicate cue type and reason): assist to initate donning socks over toes but then pt able to manage over feet from bed level, needs R UE support in standing Toilet Transfer: Minimal assistance, Ambulation Toilet Transfer Details (indicate cue type and reason): quad cane, +2 safety with PT Functional mobility during ADLs: Minimal assistance, Cane (quad cane)  Cognition: Cognition Overall Cognitive Status: Impaired/Different from baseline Arousal/Alertness: Awake/alert Orientation Level: Oriented X4 Year: 2025 Month: August Day of Week: Incorrect Attention: Focused, Sustained Focused Attention: Appears intact Sustained Attention: Impaired Sustained Attention Impairment: Verbal complex Memory: Impaired Memory Impairment: Retrieval deficit, Storage deficit, Decreased recall of new  information (Immediate: 4/5 with repetition x3; delayed: 1/5; 4/5 with cues) Awareness: Impaired Awareness Impairment: Intellectual impairment Problem Solving: Impaired Problem Solving Impairment: Verbal complex Executive Function: Sequencing, Organizing, Reasoning Reasoning: Impaired Reasoning Impairment: Verbal complex Sequencing: Impaired Sequencing Impairment: Verbal complex (clock drawing: 0/4) Organizing: Impaired Organizing Impairment: Verbal complex Cognition Arousal: Alert, Lethargic Behavior During Therapy: Flat affect Overall Cognitive Status: Impaired/Different from baseline  Physical Exam: Blood pressure 118/82, pulse (!) 105,  temperature 98.2 F (36.8 C), temperature source Oral, resp. rate (!) 27, height 6' (1.829 m), weight 129.5 kg, SpO2 94%. Physical Exam   General: No apparent distress, obese HEENT: Head is normocephalic, atraumatic, sclera anicteric, oral mucosa pink and moist Heart: Mild tachycardia. No murmurs rubs or gallops Chest: CTA bilaterally without wheezes, rales, or rhonchi; no distress.  2 L O2 [place].  Core track in place  abdomen: Soft, non-tender, protuberant , bowel sounds positive. Extremities: Trace bilateral lower extremity edema + Left FVF IV RUE Psych: Pt's affect is flat Skin: Clean and intact without signs of breakdown Neuro:     Mental Status: AAOx4, Drowsy, delayed responses Speech/Languate: + Moderate to severe dysarthria, usually answers simple yes no questions.  Answers 1 word at a time.  Slow rate of speech.  Paucity of speech.  Expressive greater than receptive aphasia, following most simple commands CRANIAL NERVES: II: PERRL III, IV, VI: EOM intact, right gaze preference V: Altered left face VII: left facial weakness VIII: normal hearing to speech IX, X: normal palatal elevation XI: 5/5 head turn and weak L shoulder shrug XII: Tongue to left      MOTOR: RUE: 5/5 Deltoid, 5/5 Biceps, 5/5 Triceps,5/5 Grip LUE: 0/5 Deltoid, 0/5 Biceps, 2/5 Triceps, 1/5 Grip RLE: HF 5/5, KE 5/5, ADF and APF 4 out of 5 LLE: HF 3/5, KE 2+/5, ADF and APF 3 out of 5      REFLEXES: No ankle clonus   SENSORY: Normal to touch all 4 extremities   Coordination: Right upper extremity with no gross ataxia   Results for orders placed or performed during the hospital encounter of 10/07/23 (from the past 48 hours)  Glucose, capillary     Status: Abnormal   Collection Time: 10/16/23  3:35 PM  Result Value Ref Range   Glucose-Capillary 101 (H) 70 - 99 mg/dL    Comment: Glucose reference range applies only to samples taken after fasting for at least 8 hours.  Glucose, capillary     Status: None    Collection Time: 10/16/23  7:48 PM  Result Value Ref Range   Glucose-Capillary 99 70 - 99 mg/dL    Comment: Glucose reference range applies only to samples taken after fasting for at least 8 hours.   Comment 1 Notify RN    Comment 2 Document in Chart   Glucose, capillary     Status: Abnormal   Collection Time: 10/16/23 11:31 PM  Result Value Ref Range   Glucose-Capillary 135 (H) 70 - 99 mg/dL    Comment: Glucose reference range applies only to samples taken after fasting for at least 8 hours.   Comment 1 Notify RN    Comment 2 Document in Chart   CBC     Status: Abnormal   Collection Time: 10/17/23  1:17 AM  Result Value Ref Range   WBC 12.3 (H) 4.0 - 10.5 K/uL   RBC 3.61 (L) 4.22 - 5.81 MIL/uL   Hemoglobin 11.6 (L) 13.0 - 17.0 g/dL   HCT 63.9 (L) 60.9 - 47.9 %  MCV 99.7 80.0 - 100.0 fL   MCH 32.1 26.0 - 34.0 pg   MCHC 32.2 30.0 - 36.0 g/dL   RDW 86.8 88.4 - 84.4 %   Platelets 363 150 - 400 K/uL   nRBC 0.9 (H) 0.0 - 0.2 %    Comment: Performed at [facility], [address], [place], [place] [address]  Basic metabolic panel with GFR     Status: Abnormal   Collection Time: 10/17/23  1:17 AM  Result Value Ref Range   Sodium 136 135 - 145 mmol/L   Potassium 4.3 3.5 - 5.1 mmol/L   Chloride 92 (L) 98 - 111 mmol/L   CO2 24 22 - 32 mmol/L   Glucose, Bld 121 (H) 70 - 99 mg/dL    Comment: Glucose reference range applies only to samples taken after fasting for at least 8 hours.   BUN 103 (H) 6 - 20 mg/dL   Creatinine, Ser 85.10 (H) 0.61 - 1.24 mg/dL   Calcium  10.0 8.9 - 10.3 mg/dL   GFR, Estimated 4 (L) >60 mL/min    Comment: (NOTE) Calculated using the CKD-EPI Creatinine Equation (2021)    Anion gap 20 (H) 5 - 15    Comment: ELECTROLYTES REPEATED TO VERIFY Performed at [facility], [address], [place], [place] [address]   Glucose, capillary     Status: Abnormal   Collection Time: 10/17/23  3:23 AM  Result Value Ref Range   Glucose-Capillary 126 (H) 70 -  99 mg/dL    Comment: Glucose reference range applies only to samples taken after fasting for at least 8 hours.   Comment 1 Notify RN    Comment 2 Document in Chart   Glucose, capillary     Status: Abnormal   Collection Time: 10/17/23  7:46 AM  Result Value Ref Range   Glucose-Capillary 124 (H) 70 - 99 mg/dL    Comment: Glucose reference range applies only to samples taken after fasting for at least 8 hours.   Comment 1 Notify RN   Glucose, capillary     Status: Abnormal   Collection Time: 10/17/23 11:44 AM  Result Value Ref Range   Glucose-Capillary 139 (H) 70 - 99 mg/dL    Comment: Glucose reference range applies only to samples taken after fasting for at least 8 hours.   Comment 1 Notify RN   Glucose, capillary     Status: Abnormal   Collection Time: 10/17/23  3:48 PM  Result Value Ref Range   Glucose-Capillary 136 (H) 70 - 99 mg/dL    Comment: Glucose reference range applies only to samples taken after fasting for at least 8 hours.   Comment 1 Notify RN   Glucose, capillary     Status: Abnormal   Collection Time: 10/17/23  8:04 PM  Result Value Ref Range   Glucose-Capillary 130 (H) 70 - 99 mg/dL    Comment: Glucose reference range applies only to samples taken after fasting for at least 8 hours.   Comment 1 Notify RN    Comment 2 Document in Chart   Glucose, capillary     Status: Abnormal   Collection Time: 10/18/23 12:22 AM  Result Value Ref Range   Glucose-Capillary 127 (H) 70 - 99 mg/dL    Comment: Glucose reference range applies only to samples taken after fasting for at least 8 hours.   Comment 1 Notify RN    Comment 2 Document in Chart   Glucose, capillary  Status: Abnormal   Collection Time: 10/18/23  4:06 AM  Result Value Ref Range   Glucose-Capillary 107 (H) 70 - 99 mg/dL    Comment: Glucose reference range applies only to samples taken after fasting for at least 8 hours.   Comment 1 Notify RN    Comment 2 Document in Chart   Glucose, capillary     Status:  Abnormal   Collection Time: 10/18/23  8:20 AM  Result Value Ref Range   Glucose-Capillary 105 (H) 70 - 99 mg/dL    Comment: Glucose reference range applies only to samples taken after fasting for at least 8 hours.   Comment 1 Notify RN   CBC     Status: Abnormal   Collection Time: 10/18/23 10:00 AM  Result Value Ref Range   WBC 11.2 (H) 4.0 - 10.5 K/uL   RBC 3.44 (L) 4.22 - 5.81 MIL/uL   Hemoglobin 10.9 (L) 13.0 - 17.0 g/dL   HCT 66.0 (L) 60.9 - 47.9 %   MCV 98.5 80.0 - 100.0 fL   MCH 31.7 26.0 - 34.0 pg   MCHC 32.2 30.0 - 36.0 g/dL   RDW 86.8 88.4 - 84.4 %   Platelets 384 150 - 400 K/uL   nRBC 0.0 0.0 - 0.2 %    Comment: Performed at [facility], [address] [place], [place] [address]  Renal function panel     Status: Abnormal   Collection Time: 10/18/23 10:00 AM  Result Value Ref Range   Sodium 135 135 - 145 mmol/L   Potassium 5.8 (H) 3.5 - 5.1 mmol/L   Chloride 89 (L) 98 - 111 mmol/L   CO2 23 22 - 32 mmol/L   Glucose, Bld 103 (H) 70 - 99 mg/dL    Comment: Glucose reference range applies only to samples taken after fasting for at least 8 hours.   BUN 139 (H) 6 - 20 mg/dL   Creatinine, Ser 81.01 (H) 0.61 - 1.24 mg/dL   Calcium  9.8 8.9 - 10.3 mg/dL   Phosphorus >69.9 (H) 2.5 - 4.6 mg/dL    Comment: RESULT CONFIRMED BY MANUAL DILUTION   Albumin 3.3 (L) 3.5 - 5.0 g/dL   GFR, Estimated 3 (L) >60 mL/min    Comment: (NOTE) Calculated using the CKD-EPI Creatinine Equation (2021)    Anion gap 23 (H) 5 - 15    Comment: ELECTROLYTES REPEATED TO VERIFY Performed at [facility], [address] [place] [address] [place], [place] [address]    DG CHEST PORT 1 VIEW Result Date: 10/17/2023 CLINICAL DATA:  Shortness of breath. EXAM: PORTABLE CHEST 1 VIEW COMPARISON:  10/12/2023. FINDINGS: Trachea is midline. Heart is enlarged, stable. Thoracic aorta is calcified. Feeding tube is followed into the stomach with the tip projecting beyond the inferior margin of the image. Contrast is seen in  the stomach in association. Lungs are somewhat low in volume. Vague airspace consolidation in the left lower lobe and lingula. IMPRESSION: Vague lingular and left lower lobe airspace opacification may be due to pneumonia. Followup PA and lateral chest X-ray is recommended in 3-4 weeks following trial of antibiotic therapy to ensure resolution and exclude underlying malignancy. Electronically Signed   By: Newell Eke M.D.   On: 10/17/2023 18:15   DG Swallowing Func-Speech Pathology Result Date: 10/17/2023 Table formatting from the original result was not included. Modified Barium Swallow Study Patient Details Name: Nathan Campbell MRN: [id number] Date of Birth: February 11, 1976 Today's Date: 10/17/2023 HPI/PMH: HPI: Pt is a 48  y/o male who presented 10/07/23 with left-sided weakness and slurred speech. MRI brain 7/26: Large right MCA territory infarct with associated diffuse cortical thickening and some mass effect. Pt s/p TNK & IR revascularization. Intubated 7/25-7/29. 7/29 ileus. PMH: CHF, CM, ESRD on HD TTS, HTN, obesity, HLD Clinical Impression: Clinical Impression: Pt demonstrates a moderate oral dysphagia as well as late initiation of swallowing. Pt has severe left lingual weakness, tongue deviated left, pooled oral secretions spilling on the left and causing coughing prior to starting exam. Despite this pt able to achieve posterior transit of each texture with repetitive slow posterior lingual propulsion and minimal anterior spillage of PO. Able to use a straw. Pt does not form a bolus and only mashes solids with tongue to palate, cannot manipulate for mastication. All boluses pool for several seconds in the pyriform sinsues prior to hyoid burst. Pt had only one instance of aspiration of thin liquids as swallow initiated. No sensation. Recommend initiating a puree diet and nectar thick liquids. Suspect pts secretion management may improve with oral intake and may be easily advance to thin liquids or improved  solids. Pt will need pills crushed in puree as he could not orally transit barium tablet. Factors that may increase risk of adverse event in presence of aspiration Noe & Lianne 2021): No data recorded Recommendations/Plan: Swallowing Evaluation Recommendations Swallowing Evaluation Recommendations Recommendations: PO diet PO Diet Recommendation: Dysphagia 1 (Pureed); Mildly thick liquids (Level 2, nectar thick) Liquid Administration via: Cup; Straw Medication Administration: Crushed with puree Supervision: Staff to assist with self-feeding Oral care recommendations: Use suctioning for oral care Caregiver Recommendations: Have oral suction available Treatment Plan Treatment Plan Treatment recommendations: Therapy as outlined in treatment plan below Follow-up recommendations: Acute inpatient rehab (3 hours/day) Treatment frequency: Min 2x/week Treatment duration: 2 weeks Interventions: Patient/family education; Trials of upgraded texture/liquids; Diet toleration management by SLP Recommendations Recommendations for follow up therapy are one component of a multi-disciplinary discharge planning process, led by the attending physician.  Recommendations may be updated based on patient status, additional functional criteria and insurance authorization. Assessment: Orofacial Exam: Orofacial Exam Oral Cavity: Oral Hygiene: Pooled secretions Oral Cavity - Dentition: Adequate natural dentition Orofacial Anatomy: WFL Oral Motor/Sensory Function: Suspected cranial nerve impairment CN V - Trigeminal: Not tested CN VII - Facial: Left motor impairment CN IX - Glossopharyngeal, CN X - Vagus: Not tested CN XII - Hypoglossal: Left motor impairment Anatomy: Anatomy: WFL Boluses Administered: Boluses Administered Boluses Administered: Thin liquids (Level 0); Mildly thick liquids (Level 2, nectar thick); Puree; Solid  Oral Impairment Domain: Oral Impairment Domain Lip Closure: Interlabial escape, no progression to anterior lip  Tongue control during bolus hold: Posterior escape of greater than half of bolus Bolus preparation/mastication: Minimal chewing/mashing with majority of bolus unchewed Bolus transport/lingual motion: Slow tongue motion Oral residue: Trace residue lining oral structures Location of oral residue : Lateral sulci Initiation of pharyngeal swallow : Pyriform sinuses  Pharyngeal Impairment Domain: Pharyngeal Impairment Domain Soft palate elevation: No bolus between soft palate (SP)/pharyngeal wall (PW) Laryngeal elevation: Complete superior movement of thyroid cartilage with complete approximation of arytenoids to epiglottic petiole Anterior hyoid excursion: Complete anterior movement Epiglottic movement: Complete inversion Laryngeal vestibule closure: Complete, no air/contrast in laryngeal vestibule Pharyngeal stripping wave : Present - complete Pharyngeal contraction (A/P view only): Complete Pharyngoesophageal segment opening: Complete distension and complete duration, no obstruction of flow Tongue base retraction: No contrast between tongue base and posterior pharyngeal wall (PPW) Pharyngeal residue: Complete pharyngeal clearance  Esophageal Impairment  Domain: No data recorded Pill: No data recorded Penetration/Aspiration Scale Score: Penetration/Aspiration Scale Score 1.  Material does not enter airway: Mildly thick liquids (Level 2, nectar thick); Puree; Solid 8.  Material enters airway, passes BELOW cords without attempt by patient to eject out (silent aspiration) : Thin liquids (Level 0) Compensatory Strategies: No data recorded  General Information: Caregiver present: No  Diet Prior to this Study: NPO; Cortrak/Small bore NG tube   No data recorded  No data recorded  Supplemental O2: None (Room air)   History of Recent Intubation: Yes  Behavior/Cognition: Alert; Cooperative Self-Feeding Abilities: Dependent for feeding Baseline vocal quality/speech: Abnormal resonance Volitional Cough: Able to elicit Volitional  Swallow: Able to elicit No data recorded Goal Planning: Prognosis for improved oropharyngeal function: Good No data recorded No data recorded Patient/Family Stated Goal: none stated No data recorded Pain: Pain Assessment Pain Assessment: No/denies pain Pain Intervention(s): Monitored during session End of Session: Start Time:SLP Start Time (ACUTE ONLY): 1150 Stop Time: SLP Stop Time (ACUTE ONLY): 1212 Time Calculation:SLP Time Calculation (min) (ACUTE ONLY): 22 min Charges: SLP Evaluations $ SLP Speech Visit: 1 Visit SLP Evaluations $BSS Swallow: 1 Procedure $MBS Swallow: 1 Procedure $Speech Treatment for Individual: 1 Procedure SLP visit diagnosis: SLP Visit Diagnosis: Dysphagia, oropharyngeal phase (R13.12) Past Medical History: Past Medical History: Diagnosis Date  Acute combined systolic and diastolic CHF, NYHA class 4 (HCC) 06/10/2016  Anemia   Cardiomyopathy (HCC) 06/14/2016  CHF (congestive heart failure) (HCC)   COVID 10/2020  Dyspnea   ESRD on hemodialysis (HCC)   TTS at [facility]  Hypertension   Hypertensive heart and kidney disease with heart failure (HCC) 06/14/2016  Hypertensive heart disease with congestive heart failure (HCC)   Obesity  Past Surgical History: Past Surgical History: Procedure Laterality Date  A/V FISTULAGRAM Left 05/03/2022  Procedure: A/V Fistulagram;  Surgeon: Eliza Lonni RAMAN, MD;  Location: [facility] INVASIVE CV LAB;  Service: Cardiovascular;  Laterality: Left;  AV FISTULA PLACEMENT Left 11/11/2020  Procedure: LEFT ARM First Stage Basilic Vein Fistula Creation;  Surgeon: Sheree Penne Lonni, MD;  Location: [facility] OR;  Service: Vascular;  Laterality: Left;  BASCILIC VEIN TRANSPOSITION Left 01/23/2021  Procedure: Second Stage Basilic Vein Transposition of Left Arm Arteriovenous  Fistula;  Surgeon: Sheree Penne Lonni, MD;  Location: [facility] OR;  Service: Vascular;  Laterality: Left;  Block  to LMA   COLONOSCOPY    FISTULA SUPERFICIALIZATION Left 05/07/2022  Procedure: PLICATION OF  ANEURYSM, LEFT ARM FISTULA;  Surgeon: Eliza Lonni RAMAN, MD;  Location: [facility] OR;  Service: Vascular;  Laterality: Left;  IR CT HEAD LTD  10/07/2023  IR CT HEAD LTD  10/07/2023  IR PATIENT EVAL TECH 0-60 MINS  10/07/2023  IR PERCUTANEOUS ART THROMBECTOMY/INFUSION INTRACRANIAL INC DIAG ANGIO  10/07/2023  NO PAST SURGERIES    PERIPHERAL VASCULAR BALLOON ANGIOPLASTY Left 05/03/2022  Procedure: PERIPHERAL VASCULAR BALLOON ANGIOPLASTY;  Surgeon: Eliza Lonni RAMAN, MD;  Location: [facility] INVASIVE CV LAB;  Service: Cardiovascular;  Laterality: Left;  AVF  RADIOLOGY WITH ANESTHESIA N/A 10/07/2023  Procedure: RADIOLOGY WITH ANESTHESIA;  Surgeon: Radiologist, Medication, MD;  Location: [facility] OR;  Service: Radiology;  Laterality: N/A; Consuelo Fort, MA CCC-SLP Acute Rehabilitation Services Secure Chat Preferred Office [phone] [place] 10/17/2023, 1:02 PM     Blood pressure 118/82, pulse (!) 105, temperature 98.2 F (36.8 C), temperature source Oral, resp. rate (!) 27, height 6' (1.829 m), weight 129.5 kg, SpO2 94%.  Medical Problem List and Plan: 1. Functional deficits secondary to  acute R MCA CVA with right M1 occlusion s/p TNK 7/25 and IR with TICI2b likely large vessel disease.  -patient may shower  -ELOS/Goals: 10-14 days, supervision  - Admit to CIR 2.  Antithrombotics: -DVT/anticoagulation:  Pharmaceutical: Heparin   -antiplatelet therapy: Aspirin  and Plavix  x 3 months then Aspirin  alone.  3. Pain Management: Tylenol  and Flexeril  (back pain) as needed 4. Mood/Behavior/Sleep: Therapy evaluations completed due to patient decreased functional mobility was admitted for a comprehensive rehab program.   -antipsychotic agents: N/A 5. Neuropsych/cognition: This patient is not capable of making decisions on his own behalf. 6. Skin/Wound Care: Routine pressure relief measures 7. Fluids/Electrolytes/Nutrition: Monitor I/O and Daily weights.   - TF + protein supplements.  Dysphagia - upgraded to  dysphagia 1 diet -TF decreased 35 cc an hour. consulted RD to advise on PO status- if progressing well maybe d/c free water  intake and TF at pm.    -calorie count ordered   -continue aspiration precautions 8. ESRD on hemodialysis: BUN 139, Cr 18.98 HD on TTHS schedule- --Labs to be collected in HD  -Hyperphos/Hyperkalemia Phos > 30, K 5.8 9. Chronic Anemia of ESRD: hgb 10.9 from 11.6 continue to monitor  10. Acute hypoxic/hypercapnic respiratory failure: On 2 L [place] CXR Vague lingular and left lower lobe airspace opacification   Repeat CXR done 8/5 pending.  11. Leukocytosis: WBC 16.0-12.3-11.2, afebrile see above  12. Combined systolic and diastolic HF/HTN: home meds amlodipine , isosorbide  and carvedilol .   - Monitor for orthostasis with activity.  13. Secondary HPTH: Home auryxia  cannot be given via NG tube--continue Fosrenol   14. HLD: LDL 85 Atorvastatin  40 mg  15. Obesity: BMI 39.77 educate on diet and weight loss to promote overall health and mobility.  16.  Hypertension: Long-term goal normotensive.  Continue Norvasc , Coreg , isosorbide   17.  Hyperlipidemia: Continue statin   Brandi L Leak, NP 10/18/2023  I have personally performed a face to face diagnostic evaluation of this patient and formulated the key components of the plan.  Additionally, I have personally reviewed laboratory data, imaging studies, as well as relevant notes and concur with the physician assistant's documentation above.  The patient's status has not changed from the original H&P.  Any changes in documentation from the acute care chart have been noted above.  Murray Collier, MD

## 2023-10-18 NOTE — Progress Notes (Signed)
 D/C order noted. Contacted [facility] to be advised of pt's d/c to inpt rehab today and that navigator will provide d/c date from rehab once confirmed. Will assist as needed.   Randine Mungo Dialysis Navigator [phone]

## 2023-10-18 NOTE — H&P (Signed)
 Physical Medicine and Rehabilitation Admission H&P        Chief Complaint  Patient presents with   Functional deficits due to debility        HPI: Nathan Campbell is a 48 year old male with a significant past medical history, including systolic and diastolic congestive heart failure, anemia, HTN, obesity, and end-stage renal disease on hemodialysis TTHS. Presented to [facility] on 10/07/2023 after experiencing an acute onset of left-sided weakness, left side neglect, dysarthria and right gaze preference.  The symptoms began earlier in the morning, and the patient presented with emergency medical services as a code stroke. Notably, the patient had been compliant with his hemodialysis treatment prior to this period. In the emergency department vitals remained stable. He was intubated for airway protection and because of agitation and restlessness. A chest X-ray showed no significant disease. Intravenous Cleviprex  was initiated. Initial CT head revealed right MCA territory ischemic changes with calcific density in proximal right M1 concerning for calcific embolus.  The patient was administered TNK, and IR was consulted and Dr. Dolphus performed a cerebral angiogram which revealed a right M1 occlusion. Subsequently, the patient underwent a thrombectomy of the right M1, achieving TICI 2B revascularization of the occluded right middle cerebral artery M1 segment, including the superior and inferior divisions to the right M2 region. An MRI demonstrated a large right MCA territory CVA with associated diffuse cortical thickening and some mass effect but no significant midline shift. MRA revealed distal right M2 occlusion but no other new occlusions. He was  extubated on 10/11/2023. On 7/30, the patient vomited and aspirated, leading to the placement of a nasogastric tube. An abdominal X-ray indicated ileus.  Echocardiogram showed an ejection fraction of 50-55%. Prior to admission, the patient was not on  any antithrombotics but was now placed on Aspirin  81mg  and Plavix  75mg , with DAPT recommended for three months followed by Aspirin  alone. Speech therapy was consulted, and a modified barium swallow study was conducted. The patient was advised to follow a dysphagia level 1 pureed diet with nectar-thick liquids. Patient was independent and working prior to admission. He lives in a one level apartment. Currently requiring max-mod assistance for ADL's and mod assist +2 for transfers and mobility. Therapy evaluations completed due to patient decreased functional mobility was admitted for a comprehensive rehab program.       Review of Systems  Constitutional:  Positive for malaise/fatigue.  Eyes: Negative.  Negative for double vision.  Respiratory:  Positive for cough. Negative for sputum production.   Cardiovascular:  Negative for chest pain.  Gastrointestinal:  Positive for constipation. Negative for abdominal pain.  Genitourinary: Negative.   Musculoskeletal:  Positive for myalgias.  Skin:  Positive for itching.       generalized itching   Neurological:  Positive for sensory change, speech change, focal weakness and weakness. Negative for dizziness, tingling, tremors and headaches.       Past Medical History:  Diagnosis Date   Acute combined systolic and diastolic CHF, NYHA class 4 (HCC) 06/10/2016   Anemia     Cardiomyopathy (HCC) 06/14/2016   CHF (congestive heart failure) (HCC)     COVID 10/2020   Dyspnea     ESRD on hemodialysis (HCC)      TTS at [facility]   Hypertension     Hypertensive heart and kidney disease with heart failure (HCC) 06/14/2016   Hypertensive heart disease with congestive heart failure (HCC)     Obesity  Past Surgical History:  Procedure Laterality Date   A/V FISTULAGRAM Left 05/03/2022    Procedure: A/V Fistulagram;  Surgeon: Eliza Lonni RAMAN, MD;  Location: [facility] INVASIVE CV LAB;  Service: Cardiovascular;  Laterality: Left;   AV FISTULA  PLACEMENT Left 11/11/2020    Procedure: LEFT ARM First Stage Basilic Vein Fistula Creation;  Surgeon: Sheree Penne Lonni, MD;  Location: [facility] OR;  Service: Vascular;  Laterality: Left;   BASCILIC VEIN TRANSPOSITION Left 01/23/2021    Procedure: Second Stage Basilic Vein Transposition of Left Arm Arteriovenous  Fistula;  Surgeon: Sheree Penne Lonni, MD;  Location: [facility] OR;  Service: Vascular;  Laterality: Left;  Block  to LMA    COLONOSCOPY       FISTULA SUPERFICIALIZATION Left 05/07/2022    Procedure: PLICATION OF ANEURYSM, LEFT ARM FISTULA;  Surgeon: Eliza Lonni RAMAN, MD;  Location: [facility] OR;  Service: Vascular;  Laterality: Left;   IR CT HEAD LTD   10/07/2023   IR CT HEAD LTD   10/07/2023   IR PATIENT EVAL TECH 0-60 MINS   10/07/2023   IR PERCUTANEOUS ART THROMBECTOMY/INFUSION INTRACRANIAL INC DIAG ANGIO   10/07/2023   NO PAST SURGERIES       PERIPHERAL VASCULAR BALLOON ANGIOPLASTY Left 05/03/2022    Procedure: PERIPHERAL VASCULAR BALLOON ANGIOPLASTY;  Surgeon: Eliza Lonni RAMAN, MD;  Location: [facility] INVASIVE CV LAB;  Service: Cardiovascular;  Laterality: Left;  AVF   RADIOLOGY WITH ANESTHESIA N/A 10/07/2023    Procedure: RADIOLOGY WITH ANESTHESIA;  Surgeon: Radiologist, Medication, MD;  Location: [facility] OR;  Service: Radiology;  Laterality: N/A;             Family History  Problem Relation Age of Onset   Hypertension Mother     Diabetes Father     Hypertension Father     Hypertension Sister     Hypertension Paternal Grandmother     Diabetes Mellitus II Paternal Grandfather     Heart disease Cousin     Heart disease Cousin     Colon cancer Cousin     Esophageal cancer Neg Hx     Rectal cancer Neg Hx     Stomach cancer Neg Hx          Social History:  reports that he has been smoking cigarettes. He has never used smokeless tobacco. He reports that he does not currently use alcohol. He reports that he does not currently use drugs. Allergies:  Allergies  No Known Allergies             Facility-Administered Medications Prior to Admission  Medication Dose Route Frequency Provider Last Rate Last Admin   0.9 %  sodium chloride  infusion  250 mL Intravenous PRN Sheree Penne Lonni, MD   500 mL at 10/07/23 1044   sodium chloride  flush (NS) 0.9 % injection 3 mL  3 mL Intravenous Q12H Sheree Penne Lonni, MD                Medications Prior to Admission  Medication Sig Dispense Refill   acetaminophen  (TYLENOL ) 325 MG tablet Take 2 tablets (650 mg total) by mouth every 6 (six) hours as needed for moderate pain. 30 tablet 0   allopurinol  (ZYLOPRIM ) 100 MG tablet Take 1 tablet (100 mg total) by mouth daily. Do not begin until pain in right knee is resolved. 30 tablet 1   amLODipine  (NORVASC ) 10 MG tablet Take 1 tablet (10 mg total) by mouth daily. 30 tablet 1   AURYXIA  1 GM  210 MG(Fe) tablet Take 420-630 mg by mouth See admin instructions. Take 630 mg with each meal and 420 to 630 mg with each snack       B Complex-C-Zn-Folic Acid (DIALYVITE 800/ZINC PO) Take 1 tablet by mouth daily.       benzonatate  (TESSALON ) 100 MG capsule Take 1 capsule (100 mg total) by mouth 3 (three) times daily as needed for cough. 30 capsule 0   carvedilol  (COREG ) 25 MG tablet Take 25 mg by mouth 2 (two) times daily.       guaiFENesin -dextromethorphan (ROBITUSSIN DM) 100-10 MG/5ML syrup Take 5 mLs by mouth 3 (three) times daily as needed for cough. 118 mL 0   isosorbide  mononitrate (ISMO ) 10 MG tablet Take 10 mg by mouth daily.       omeprazole  (PRILOSEC) 20 MG capsule Take 1 capsule (20 mg total) by mouth daily. Take after dialysis on dialysis days 30 capsule 0   promethazine -dextromethorphan (PROMETHAZINE -DM) 6.25-15 MG/5ML syrup Take 10 mLs by mouth every 6 (six) hours as needed for cough. 118 mL 0   torsemide  (DEMADEX ) 20 MG tablet Take 60 mg by mouth 2 (two) times daily.                Home: Home Living Family/patient expects to be discharged to:: Private residence Living  Arrangements: Alone Available Help at Discharge:  (unknown) Type of Home: Apartment Home Access: Stairs to enter Entrance Stairs-Number of Steps: 1 flight Entrance Stairs-Rails: Right, Left Home Layout: One level Home Equipment: None  Lives With:  (unknown)   Functional History: Prior Function Prior Level of Function : Independent/Modified Independent, Driving Mobility Comments: no use of DME ADLs Comments: independent, driving himself to HD   Functional Status:  Mobility: Bed Mobility Overal bed mobility: Needs Assistance Bed Mobility: Rolling, Sidelying to Sit Rolling: Contact guard assist Sidelying to sit: Contact guard assist, HOB elevated Supine to sit: Total assist, HOB elevated Sit to sidelying: Mod assist, +2 for safety/equipment General bed mobility comments: Pt sitting EOB with OT at beginning of session Transfers Overall transfer level: Needs assistance Equipment used: Quad cane Transfers: Sit to/from Stand Sit to Stand: Min assist, +2 safety/equipment Bed to/from chair/wheelchair/BSC transfer type:: Step pivot Step pivot transfers: Mod assist General transfer comment: STS from EOB and recliner with cues for hand placement and min A for power up Ambulation/Gait Ambulation/Gait assistance: Min assist, +2 safety/equipment Gait Distance (Feet): 15 Feet (+10) Assistive device: Quad cane Gait Pattern/deviations: Trunk flexed, Step-through pattern, Narrow base of support, Step-to pattern, Decreased stride length, Decreased step length - left General Gait Details: Pt demonstrates short L step length and flexed trunk requiring cues for increased step length and upright posture. Dense cues for sequencing with quad cane with pt demonstrating slightly improved recall with increased time for processing at end of trial. Cues for slowed pacing for safety due to increased instability. Chair follow for safety with pt requiring 1 seated rest break Gait velocity interpretation: <1.8  ft/sec, indicate of risk for recurrent falls   ADL: ADL Overall ADL's : Needs assistance/impaired Eating/Feeding: NPO Grooming: Sitting, Oral care, Wash/dry face, Moderate assistance Grooming Details (indicate cue type and reason): needs assist for bimanual tasks, min assist for safety with oral care using suction toothbrush Upper Body Bathing: Maximal assistance, Sitting Lower Body Bathing: Maximal assistance, Bed level Upper Body Dressing : Moderate assistance, Sitting Lower Body Dressing: Maximal assistance, Sit to/from stand, Bed level Lower Body Dressing Details (indicate cue type and reason): assist to initate  donning socks over toes but then pt able to manage over feet from bed level, needs R UE support in standing Toilet Transfer: Minimal assistance, Ambulation Toilet Transfer Details (indicate cue type and reason): quad cane, +2 safety with PT Functional mobility during ADLs: Minimal assistance, Cane (quad cane)   Cognition: Cognition Overall Cognitive Status: Impaired/Different from baseline Arousal/Alertness: Awake/alert Orientation Level: Oriented X4 Year: 2025 Month: August Day of Week: Incorrect Attention: Focused, Sustained Focused Attention: Appears intact Sustained Attention: Impaired Sustained Attention Impairment: Verbal complex Memory: Impaired Memory Impairment: Retrieval deficit, Storage deficit, Decreased recall of new information (Immediate: 4/5 with repetition x3; delayed: 1/5; 4/5 with cues) Awareness: Impaired Awareness Impairment: Intellectual impairment Problem Solving: Impaired Problem Solving Impairment: Verbal complex Executive Function: Sequencing, Organizing, Reasoning Reasoning: Impaired Reasoning Impairment: Verbal complex Sequencing: Impaired Sequencing Impairment: Verbal complex (clock drawing: 0/4) Organizing: Impaired Organizing Impairment: Verbal complex Cognition Arousal: Alert, Lethargic Behavior During Therapy: Flat  affect Overall Cognitive Status: Impaired/Different from baseline   Physical Exam: Blood pressure 118/82, pulse (!) 105, temperature 98.2 F (36.8 C), temperature source Oral, resp. rate (!) 27, height 6' (1.829 m), weight 129.5 kg, SpO2 94%. Physical Exam     General: No apparent distress, obese HEENT: Head is normocephalic, atraumatic, sclera anicteric, oral mucosa pink and moist Heart: Mild tachycardia. No murmurs rubs or gallops Chest: CTA bilaterally without wheezes, rales, or rhonchi; no distress.  2 L O2 [place].  Core track in place  abdomen: Soft, non-tender, protuberant , bowel sounds positive. Extremities: Trace bilateral lower extremity edema + Left FVF IV RUE Psych: Pt's affect is flat Skin: Clean and intact without signs of breakdown Neuro:     Mental Status: AAOx4, Drowsy, delayed responses Speech/Languate: + Moderate to severe dysarthria, usually answers simple yes no questions.  Answers 1 word at a time.  Slow rate of speech.  Paucity of speech.  Expressive greater than receptive aphasia, following most simple commands CRANIAL NERVES: II: PERRL III, IV, VI: EOM intact, right gaze preference V: Altered left face VII: left facial weakness VIII: normal hearing to speech IX, X: normal palatal elevation XI: 5/5 head turn and weak L shoulder shrug XII: Tongue to left      MOTOR: RUE: 5/5 Deltoid, 5/5 Biceps, 5/5 Triceps,5/5 Grip LUE: 0/5 Deltoid, 0/5 Biceps, 2/5 Triceps, 1/5 Grip RLE: HF 5/5, KE 5/5, ADF and APF 4 out of 5 LLE: HF 3/5, KE 2+/5, ADF and APF 3 out of 5      REFLEXES: No ankle clonus   SENSORY: Normal to touch all 4 extremities   Coordination: Right upper extremity with no gross ataxia   Lab Results Last 48 Hours        Results for orders placed or performed during the hospital encounter of 10/07/23 (from the past 48 hours)  Glucose, capillary     Status: Abnormal    Collection Time: 10/16/23  3:35 PM  Result Value Ref Range     Glucose-Capillary 101 (H) 70 - 99 mg/dL      Comment: Glucose reference range applies only to samples taken after fasting for at least 8 hours.  Glucose, capillary     Status: None    Collection Time: 10/16/23  7:48 PM  Result Value Ref Range    Glucose-Capillary 99 70 - 99 mg/dL      Comment: Glucose reference range applies only to samples taken after fasting for at least 8 hours.    Comment 1 Notify RN  Comment 2 Document in Chart    Glucose, capillary     Status: Abnormal    Collection Time: 10/16/23 11:31 PM  Result Value Ref Range    Glucose-Capillary 135 (H) 70 - 99 mg/dL      Comment: Glucose reference range applies only to samples taken after fasting for at least 8 hours.    Comment 1 Notify RN      Comment 2 Document in Chart    CBC     Status: Abnormal    Collection Time: 10/17/23  1:17 AM  Result Value Ref Range    WBC 12.3 (H) 4.0 - 10.5 K/uL    RBC 3.61 (L) 4.22 - 5.81 MIL/uL    Hemoglobin 11.6 (L) 13.0 - 17.0 g/dL    HCT 63.9 (L) 60.9 - 52.0 %    MCV 99.7 80.0 - 100.0 fL    MCH 32.1 26.0 - 34.0 pg    MCHC 32.2 30.0 - 36.0 g/dL    RDW 86.8 88.4 - 84.4 %    Platelets 363 150 - 400 K/uL    nRBC 0.9 (H) 0.0 - 0.2 %      Comment: Performed at [facility], [address], [place], [place] [address]  Basic metabolic panel with GFR     Status: Abnormal    Collection Time: 10/17/23  1:17 AM  Result Value Ref Range    Sodium 136 135 - 145 mmol/L    Potassium 4.3 3.5 - 5.1 mmol/L    Chloride 92 (L) 98 - 111 mmol/L    CO2 24 22 - 32 mmol/L    Glucose, Bld 121 (H) 70 - 99 mg/dL      Comment: Glucose reference range applies only to samples taken after fasting for at least 8 hours.    BUN 103 (H) 6 - 20 mg/dL    Creatinine, Ser 85.10 (H) 0.61 - 1.24 mg/dL    Calcium  10.0 8.9 - 10.3 mg/dL    GFR, Estimated 4 (L) >60 mL/min      Comment: (NOTE) Calculated using the CKD-EPI Creatinine Equation (2021)      Anion gap 20 (H) 5 - 15      Comment: ELECTROLYTES  REPEATED TO VERIFY Performed at [facility], [address], [place], [place] [address]    Glucose, capillary     Status: Abnormal    Collection Time: 10/17/23  3:23 AM  Result Value Ref Range    Glucose-Capillary 126 (H) 70 - 99 mg/dL      Comment: Glucose reference range applies only to samples taken after fasting for at least 8 hours.    Comment 1 Notify RN      Comment 2 Document in Chart    Glucose, capillary     Status: Abnormal    Collection Time: 10/17/23  7:46 AM  Result Value Ref Range    Glucose-Capillary 124 (H) 70 - 99 mg/dL      Comment: Glucose reference range applies only to samples taken after fasting for at least 8 hours.    Comment 1 Notify RN    Glucose, capillary     Status: Abnormal    Collection Time: 10/17/23 11:44 AM  Result Value Ref Range    Glucose-Capillary 139 (H) 70 - 99 mg/dL      Comment: Glucose reference range applies only to samples taken after fasting for at least 8 hours.    Comment 1 Notify RN  Glucose, capillary     Status: Abnormal    Collection Time: 10/17/23  3:48 PM  Result Value Ref Range    Glucose-Capillary 136 (H) 70 - 99 mg/dL      Comment: Glucose reference range applies only to samples taken after fasting for at least 8 hours.    Comment 1 Notify RN    Glucose, capillary     Status: Abnormal    Collection Time: 10/17/23  8:04 PM  Result Value Ref Range    Glucose-Capillary 130 (H) 70 - 99 mg/dL      Comment: Glucose reference range applies only to samples taken after fasting for at least 8 hours.    Comment 1 Notify RN      Comment 2 Document in Chart    Glucose, capillary     Status: Abnormal    Collection Time: 10/18/23 12:22 AM  Result Value Ref Range    Glucose-Capillary 127 (H) 70 - 99 mg/dL      Comment: Glucose reference range applies only to samples taken after fasting for at least 8 hours.    Comment 1 Notify RN      Comment 2 Document in Chart    Glucose, capillary     Status: Abnormal    Collection Time:  10/18/23  4:06 AM  Result Value Ref Range    Glucose-Capillary 107 (H) 70 - 99 mg/dL      Comment: Glucose reference range applies only to samples taken after fasting for at least 8 hours.    Comment 1 Notify RN      Comment 2 Document in Chart    Glucose, capillary     Status: Abnormal    Collection Time: 10/18/23  8:20 AM  Result Value Ref Range    Glucose-Capillary 105 (H) 70 - 99 mg/dL      Comment: Glucose reference range applies only to samples taken after fasting for at least 8 hours.    Comment 1 Notify RN    CBC     Status: Abnormal    Collection Time: 10/18/23 10:00 AM  Result Value Ref Range    WBC 11.2 (H) 4.0 - 10.5 K/uL    RBC 3.44 (L) 4.22 - 5.81 MIL/uL    Hemoglobin 10.9 (L) 13.0 - 17.0 g/dL    HCT 66.0 (L) 60.9 - 52.0 %    MCV 98.5 80.0 - 100.0 fL    MCH 31.7 26.0 - 34.0 pg    MCHC 32.2 30.0 - 36.0 g/dL    RDW 86.8 88.4 - 84.4 %    Platelets 384 150 - 400 K/uL    nRBC 0.0 0.0 - 0.2 %      Comment: Performed at [facility], [address] [place], [place] [address]  Renal function panel     Status: Abnormal    Collection Time: 10/18/23 10:00 AM  Result Value Ref Range    Sodium 135 135 - 145 mmol/L    Potassium 5.8 (H) 3.5 - 5.1 mmol/L    Chloride 89 (L) 98 - 111 mmol/L    CO2 23 22 - 32 mmol/L    Glucose, Bld 103 (H) 70 - 99 mg/dL      Comment: Glucose reference range applies only to samples taken after fasting for at least 8 hours.    BUN 139 (H) 6 - 20 mg/dL    Creatinine, Ser 81.01 (H) 0.61 - 1.24 mg/dL    Calcium  9.8 8.9 - 10.3  mg/dL    Phosphorus >69.9 (H) 2.5 - 4.6 mg/dL      Comment: RESULT CONFIRMED BY MANUAL DILUTION    Albumin 3.3 (L) 3.5 - 5.0 g/dL    GFR, Estimated 3 (L) >60 mL/min      Comment: (NOTE) Calculated using the CKD-EPI Creatinine Equation (2021)      Anion gap 23 (H) 5 - 15      Comment: ELECTROLYTES REPEATED TO VERIFY Performed at [facility], [address], [place], [place] [address]         Imaging Results  (Last 48 hours)  DG CHEST PORT 1 VIEW Result Date: 10/17/2023 CLINICAL DATA:  Shortness of breath. EXAM: PORTABLE CHEST 1 VIEW COMPARISON:  10/12/2023. FINDINGS: Trachea is midline. Heart is enlarged, stable. Thoracic aorta is calcified. Feeding tube is followed into the stomach with the tip projecting beyond the inferior margin of the image. Contrast is seen in the stomach in association. Lungs are somewhat low in volume. Vague airspace consolidation in the left lower lobe and lingula. IMPRESSION: Vague lingular and left lower lobe airspace opacification may be due to pneumonia. Followup PA and lateral chest X-ray is recommended in 3-4 weeks following trial of antibiotic therapy to ensure resolution and exclude underlying malignancy. Electronically Signed   By: Newell Eke M.D.   On: 10/17/2023 18:15    DG Swallowing Func-Speech Pathology Result Date: 10/17/2023 Table formatting from the original result was not included. Modified Barium Swallow Study Patient Details Name: Rawlin Reaume MRN: [id number] Date of Birth: 02-16-1976 Today's Date: 10/17/2023 HPI/PMH: HPI: Pt is a 48 y/o male who presented 10/07/23 with left-sided weakness and slurred speech. MRI brain 7/26: Large right MCA territory infarct with associated diffuse cortical thickening and some mass effect. Pt s/p TNK & IR revascularization. Intubated 7/25-7/29. 7/29 ileus. PMH: CHF, CM, ESRD on HD TTS, HTN, obesity, HLD Clinical Impression: Clinical Impression: Pt demonstrates a moderate oral dysphagia as well as late initiation of swallowing. Pt has severe left lingual weakness, tongue deviated left, pooled oral secretions spilling on the left and causing coughing prior to starting exam. Despite this pt able to achieve posterior transit of each texture with repetitive slow posterior lingual propulsion and minimal anterior spillage of PO. Able to use a straw. Pt does not form a bolus and only mashes solids with tongue to palate, cannot manipulate for  mastication. All boluses pool for several seconds in the pyriform sinsues prior to hyoid burst. Pt had only one instance of aspiration of thin liquids as swallow initiated. No sensation. Recommend initiating a puree diet and nectar thick liquids. Suspect pts secretion management may improve with oral intake and may be easily advance to thin liquids or improved solids. Pt will need pills crushed in puree as he could not orally transit barium tablet. Factors that may increase risk of adverse event in presence of aspiration Noe & Lianne 2021): No data recorded Recommendations/Plan: Swallowing Evaluation Recommendations Swallowing Evaluation Recommendations Recommendations: PO diet PO Diet Recommendation: Dysphagia 1 (Pureed); Mildly thick liquids (Level 2, nectar thick) Liquid Administration via: Cup; Straw Medication Administration: Crushed with puree Supervision: Staff to assist with self-feeding Oral care recommendations: Use suctioning for oral care Caregiver Recommendations: Have oral suction available Treatment Plan Treatment Plan Treatment recommendations: Therapy as outlined in treatment plan below Follow-up recommendations: Acute inpatient rehab (3 hours/day) Treatment frequency: Min 2x/week Treatment duration: 2 weeks Interventions: Patient/family education; Trials of upgraded texture/liquids; Diet toleration management by SLP Recommendations Recommendations for follow up  therapy are one component of a multi-disciplinary discharge planning process, led by the attending physician.  Recommendations may be updated based on patient status, additional functional criteria and insurance authorization. Assessment: Orofacial Exam: Orofacial Exam Oral Cavity: Oral Hygiene: Pooled secretions Oral Cavity - Dentition: Adequate natural dentition Orofacial Anatomy: WFL Oral Motor/Sensory Function: Suspected cranial nerve impairment CN V - Trigeminal: Not tested CN VII - Facial: Left motor impairment CN IX -  Glossopharyngeal, CN X - Vagus: Not tested CN XII - Hypoglossal: Left motor impairment Anatomy: Anatomy: WFL Boluses Administered: Boluses Administered Boluses Administered: Thin liquids (Level 0); Mildly thick liquids (Level 2, nectar thick); Puree; Solid  Oral Impairment Domain: Oral Impairment Domain Lip Closure: Interlabial escape, no progression to anterior lip Tongue control during bolus hold: Posterior escape of greater than half of bolus Bolus preparation/mastication: Minimal chewing/mashing with majority of bolus unchewed Bolus transport/lingual motion: Slow tongue motion Oral residue: Trace residue lining oral structures Location of oral residue : Lateral sulci Initiation of pharyngeal swallow : Pyriform sinuses  Pharyngeal Impairment Domain: Pharyngeal Impairment Domain Soft palate elevation: No bolus between soft palate (SP)/pharyngeal wall (PW) Laryngeal elevation: Complete superior movement of thyroid cartilage with complete approximation of arytenoids to epiglottic petiole Anterior hyoid excursion: Complete anterior movement Epiglottic movement: Complete inversion Laryngeal vestibule closure: Complete, no air/contrast in laryngeal vestibule Pharyngeal stripping wave : Present - complete Pharyngeal contraction (A/P view only): Complete Pharyngoesophageal segment opening: Complete distension and complete duration, no obstruction of flow Tongue base retraction: No contrast between tongue base and posterior pharyngeal wall (PPW) Pharyngeal residue: Complete pharyngeal clearance  Esophageal Impairment Domain: No data recorded Pill: No data recorded Penetration/Aspiration Scale Score: Penetration/Aspiration Scale Score 1.  Material does not enter airway: Mildly thick liquids (Level 2, nectar thick); Puree; Solid 8.  Material enters airway, passes BELOW cords without attempt by patient to eject out (silent aspiration) : Thin liquids (Level 0) Compensatory Strategies: No data recorded  General Information:  Caregiver present: No  Diet Prior to this Study: NPO; Cortrak/Small bore NG tube   No data recorded  No data recorded  Supplemental O2: None (Room air)   History of Recent Intubation: Yes  Behavior/Cognition: Alert; Cooperative Self-Feeding Abilities: Dependent for feeding Baseline vocal quality/speech: Abnormal resonance Volitional Cough: Able to elicit Volitional Swallow: Able to elicit No data recorded Goal Planning: Prognosis for improved oropharyngeal function: Good No data recorded No data recorded Patient/Family Stated Goal: none stated No data recorded Pain: Pain Assessment Pain Assessment: No/denies pain Pain Intervention(s): Monitored during session End of Session: Start Time:SLP Start Time (ACUTE ONLY): 1150 Stop Time: SLP Stop Time (ACUTE ONLY): 1212 Time Calculation:SLP Time Calculation (min) (ACUTE ONLY): 22 min Charges: SLP Evaluations $ SLP Speech Visit: 1 Visit SLP Evaluations $BSS Swallow: 1 Procedure $MBS Swallow: 1 Procedure $Speech Treatment for Individual: 1 Procedure SLP visit diagnosis: SLP Visit Diagnosis: Dysphagia, oropharyngeal phase (R13.12) Past Medical History: Past Medical History: Diagnosis Date  Acute combined systolic and diastolic CHF, NYHA class 4 (HCC) 06/10/2016  Anemia   Cardiomyopathy (HCC) 06/14/2016  CHF (congestive heart failure) (HCC)   COVID 10/2020  Dyspnea   ESRD on hemodialysis (HCC)   TTS at [facility]  Hypertension   Hypertensive heart and kidney disease with heart failure (HCC) 06/14/2016  Hypertensive heart disease with congestive heart failure (HCC)   Obesity  Past Surgical History: Past Surgical History: Procedure Laterality Date  A/V FISTULAGRAM Left 05/03/2022  Procedure: A/V Fistulagram;  Surgeon: Eliza Lonni RAMAN, MD;  Location: [facility]  INVASIVE CV LAB;  Service: Cardiovascular;  Laterality: Left;  AV FISTULA PLACEMENT Left 11/11/2020  Procedure: LEFT ARM First Stage Basilic Vein Fistula Creation;  Surgeon: Sheree Penne Bruckner, MD;  Location: [facility] OR;   Service: Vascular;  Laterality: Left;  BASCILIC VEIN TRANSPOSITION Left 01/23/2021  Procedure: Second Stage Basilic Vein Transposition of Left Arm Arteriovenous  Fistula;  Surgeon: Sheree Penne Bruckner, MD;  Location: [facility] OR;  Service: Vascular;  Laterality: Left;  Block  to LMA   COLONOSCOPY    FISTULA SUPERFICIALIZATION Left 05/07/2022  Procedure: PLICATION OF ANEURYSM, LEFT ARM FISTULA;  Surgeon: Eliza Bruckner RAMAN, MD;  Location: [facility] OR;  Service: Vascular;  Laterality: Left;  IR CT HEAD LTD  10/07/2023  IR CT HEAD LTD  10/07/2023  IR PATIENT EVAL TECH 0-60 MINS  10/07/2023  IR PERCUTANEOUS ART THROMBECTOMY/INFUSION INTRACRANIAL INC DIAG ANGIO  10/07/2023  NO PAST SURGERIES    PERIPHERAL VASCULAR BALLOON ANGIOPLASTY Left 05/03/2022  Procedure: PERIPHERAL VASCULAR BALLOON ANGIOPLASTY;  Surgeon: Eliza Bruckner RAMAN, MD;  Location: [facility] INVASIVE CV LAB;  Service: Cardiovascular;  Laterality: Left;  AVF  RADIOLOGY WITH ANESTHESIA N/A 10/07/2023  Procedure: RADIOLOGY WITH ANESTHESIA;  Surgeon: Radiologist, Medication, MD;  Location: [facility] OR;  Service: Radiology;  Laterality: N/A; Consuelo Fort, MA CCC-SLP Acute Rehabilitation Services Secure Chat Preferred Office [phone] [place] 10/17/2023, 1:02 PM          Blood pressure 118/82, pulse (!) 105, temperature 98.2 F (36.8 C), temperature source Oral, resp. rate (!) 27, height 6' (1.829 m), weight 129.5 kg, SpO2 94%.   Medical Problem List and Plan: 1. Functional deficits secondary to acute R MCA CVA with right M1 occlusion s/p TNK 7/25 and IR with TICI2b likely large vessel disease.             -patient may shower             -ELOS/Goals: 10-14 days, supervision             - Admit to CIR 2.  Antithrombotics: -DVT/anticoagulation:  Pharmaceutical: Heparin              -antiplatelet therapy: Aspirin  and Plavix  x 3 months then Aspirin  alone.  3. Pain Management: Tylenol  and Flexeril  (back pain) as needed 4. Mood/Behavior/Sleep: Therapy  evaluations completed due to patient decreased functional mobility was admitted for a comprehensive rehab program.              -antipsychotic agents: N/A 5. Neuropsych/cognition: This patient is not capable of making decisions on his own behalf. 6. Skin/Wound Care: Routine pressure relief measures 7. Fluids/Electrolytes/Nutrition: Monitor I/O and Daily weights.              - TF + protein supplements.  Dysphagia - upgraded to dysphagia 1 diet -TF decreased 35 cc an hour. consulted RD to advise on PO status- if progressing well maybe d/c free water  intake and TF at pm.                   -calorie count ordered              -continue aspiration precautions 8. ESRD on hemodialysis: BUN 139, Cr 18.98 HD on TTHS schedule- --Labs to be collected in HD             -Hyperphos/Hyperkalemia Phos > 30, K 5.8 9. Chronic Anemia of ESRD: hgb 10.9 from 11.6 continue to monitor  10. Acute hypoxic/hypercapnic respiratory failure: On 2 L [place] CXR Vague lingular  and left lower lobe airspace opacification   Repeat CXR done 8/5 pending.  11. Leukocytosis: WBC 16.0-12.3-11.2, afebrile see above  12. Combined systolic and diastolic HF/HTN: home meds amlodipine , isosorbide  and carvedilol .              - Monitor for orthostasis with activity.  13. Secondary HPTH: Home auryxia  cannot be given via NG tube--continue Fosrenol   14. HLD: LDL 85 Atorvastatin  40 mg  15. Obesity: BMI 39.77 educate on diet and weight loss to promote overall health and mobility.  16.  Hypertension: Long-term goal normotensive.  Continue Norvasc , Coreg , isosorbide   17.  Hyperlipidemia: Continue statin     Brandi L Leak, NP 10/18/2023   I have personally performed a face to face diagnostic evaluation of this patient and formulated the key components of the plan.  Additionally, I have personally reviewed laboratory data, imaging studies, as well as relevant notes and concur with the physician assistant's documentation above.   The patient's status  has not changed from the original H&P.  Any changes in documentation from the acute care chart have been noted above.   Murray Collier, MD

## 2023-10-18 NOTE — Progress Notes (Signed)
 PROGRESS NOTE  Nathan Campbell FMW:[id number] DOB: Feb 24, 1976   PCP: Pcp, No  Patient is from: Home.  DOA: 10/07/2023 LOS: 11  Chief complaints Chief Complaint  Patient presents with   Code Stroke     Brief Narrative / Interim history: 48 year old M with PMH of ESRD on HD TTS, combined CHF/CM, morbid obesity and HTN presenting with acute left hemiparesis and dysarthria admitted to ICU for acute CVA.  Patient underwent TNK, and transferred out of ICU to regular floor.  Still with dysarthria, left facial droop and left arm weakness.  He is exclusively on tube feed for dysphagia but upgraded to dysphagia 1 diet.  Tube feed rate decreased to 35 cc an hour.  Dietitian following.  Therapy recommended CIR.  Subjective: Seen and examined earlier this afternoon after he returned from dialysis.  No complaints other than some itching.  Patient's mother and nephew at bedside.  Objective: Vitals:   10/18/23 1321 10/18/23 1326 10/18/23 1359 10/18/23 1600  BP: 121/76 (!) 149/112 131/76 (!) 140/83  Pulse: 99 (!) 104 (!) 103 (!) 101  Resp: (!) 29 15  18   Temp: 98.2 F (36.8 C)  (!) 97.5 F (36.4 C) 98 F (36.7 C)  TempSrc: Oral  Oral Oral  SpO2: 96% 95% 97% 91%  Weight:  128.1 kg    Height:        Examination:  GENERAL: No apparent distress.  Nontoxic. HEENT: MMM.  Vision and hearing grossly intact.  NECK: Supple.  No apparent JVD.  RESP:  No IWOB.  Fair aeration bilaterally. CVS:  RRR. Heart sounds normal.  ABD/GI/GU: BS+. Abd soft, NTND.  MSK/EXT:  Moves extremities. No apparent deformity. No edema.  SKIN: no apparent skin lesion or wound NEURO: AA.  Oriented appropriately.  Dysarthria.  Left facial droop.  Motor 1/5 in LUE.  4/5 in LLE and 5/5 elsewhere.  No apparent focal neuro deficit. PSYCH: Calm. Normal affect.   Consultants:  Neurology Nephrology  Procedures: None  Microbiology summarized: MRSA PCR screen nonreactive Respiratory culture with normal  flora  Assessment and plan: Acute right MCA CVA: Presents with left-sided weakness, dysarthria and left facial droop.  MRI showed large right MCA territory infarct with associated diffuse cortical thickening and some mass effect but no midline shift.  Patient received TNK while in ICU.  MRI angio head revealed distal right M2 occlusion.  TTE without significant finding.  LDL 85.  A1c 5.1%.  Transferred out of ICU to regular floor on 8/2. - Continue aspirin , Lipitor, and Plavix . - Therapy recommended CIR.  CIR following.  Dysphagia due to stroke -Advanced to dysphagia 1 diet on 8/4 -Tube feed via cortrack-rate decreased to 35 cc an hour. -Aspiration precaution  ESRD on HD TTS/hyperkalemia/azotemia -Hemodialysis per nephrology.   Chronic combined CHF/cardiomyopathy: TTE with LVEF of 55 to 60% G2-DD, severe RAE.  (EF was 35% with G2 DD in 2018).  Appears euvolemic on exam. -Fluid management by dialysis   Essential hypertension: Fairly controlled. -Continue amlodipine  and Coreg  -Continue low-dose disorder -IV labetalol  as needed   Anemia of renal disease: Stable -Monitor  Mild leukocytosis: Likely demargination - Continue monitoring  Class II obesity Body mass index is 38.3 kg/m. Nutrition Problem: Inadequate oral intake Etiology: inability to eat Signs/Symptoms: NPO status Interventions: Tube feeding   DVT prophylaxis:  heparin  bolus via infusion 1,500 Units Start: 10/10/23 0500 heparin  injection 5,000 Units Start: 10/08/23 1400 SCDs Start: 10/07/23 1314  Code Status: Full code Family Communication: Updated patient's mother and  nephew at bedside. Level of care: Telemetry Medical Status is: Inpatient Remains inpatient appropriate because: CIR bed   Final disposition: CIR   35 minutes with more than 50% spent in reviewing records, counseling patient/family and coordinating care.   Sch Meds:  Scheduled Meds:  amLODipine   10 mg Per Tube Daily   aspirin   81 mg Per  Tube Daily   atorvastatin   40 mg Per Tube Daily   carvedilol   9.375 mg Per Tube BID   Chlorhexidine  Gluconate Cloth  6 each Topical Q0600   clopidogrel   75 mg Per Tube Daily   docusate  100 mg Per Tube BID   feeding supplement (PROSource TF20)  60 mL Per Tube Daily   free water   30 mL Per Tube Q6H   heparin   1,500 Units Intravenous Once   heparin  injection (subcutaneous)  5,000 Units Subcutaneous Q8H   insulin  aspart  0-6 Units Subcutaneous Q4H   isosorbide  dinitrate  5 mg Per Tube BID   lanthanum   1,000 mg Per Tube TID WC   mouth rinse  15 mL Mouth Rinse 4 times per day   pantoprazole  (PROTONIX ) IV  40 mg Intravenous QHS   Continuous Infusions:  feeding supplement (OSMOLITE 1.5 CAL) 1,000 mL (10/17/23 2205)   PRN Meds:.acetaminophen  **OR** acetaminophen  (TYLENOL ) oral liquid 160 mg/5 mL **OR** acetaminophen , bisacodyl , cyclobenzaprine , labetalol , ondansetron  (ZOFRAN ) IV, mouth rinse  Antimicrobials: Anti-infectives (From admission, onward)    Start     Dose/Rate Route Frequency Ordered Stop   10/10/23 1000  Ampicillin -Sulbactam (UNASYN ) 3 g in sodium chloride  0.9 % 100 mL IVPB        3 g 200 mL/hr over 30 Minutes Intravenous Every 12 hours 10/10/23 0726 10/14/23 2330        I have personally reviewed the following labs and images: CBC: Recent Labs  Lab 10/13/23 0552 10/14/23 0618 10/15/23 0321 10/17/23 0117 10/18/23 1000  WBC 15.8* 16.2* 16.0* 12.3* 11.2*  HGB 10.9* 11.9* 10.7* 11.6* 10.9*  HCT 33.8* 38.2* 33.9* 36.0* 33.9*  MCV 99.4 100.5* 101.2* 99.7 98.5  PLT 301 350 358 363 384   BMP &GFR Recent Labs  Lab 10/13/23 0552 10/14/23 0618 10/15/23 0321 10/17/23 0117 10/18/23 1000  NA 142 140 140 136 135  K 4.3 4.3 4.5 4.3 5.8*  CL 95* 91* 92* 92* 89*  CO2 21* 25 24 24 23   GLUCOSE 116* 114* 123* 121* 103*  BUN 87* 72* 105* 103* 139*  CREATININE 15.18* 13.26* 16.04* 14.89* 18.98*  CALCIUM  10.5* 10.8* 10.3 10.0 9.8  PHOS 11.4*  --   --   --  >30.0*    Estimated Creatinine Clearance: 6.8 mL/min (A) (by C-G formula based on SCr of 18.98 mg/dL (H)). Liver & Pancreas: Recent Labs  Lab 10/18/23 1000  ALBUMIN 3.3*   No results for input(s): LIPASE, AMYLASE in the last 168 hours. No results for input(s): AMMONIA in the last 168 hours. Diabetic: No results for input(s): HGBA1C in the last 72 hours. Recent Labs  Lab 10/18/23 0022 10/18/23 0406 10/18/23 0820 10/18/23 1415 10/18/23 1601  GLUCAP 127* 107* 105* 111* 121*   Cardiac Enzymes: No results for input(s): CKTOTAL, CKMB, CKMBINDEX, TROPONINI in the last 168 hours. No results for input(s): PROBNP in the last 8760 hours. Coagulation Profile: No results for input(s): INR, PROTIME in the last 168 hours. Thyroid Function Tests: No results for input(s): TSH, T4TOTAL, FREET4, T3FREE, THYROIDAB in the last 72 hours. Lipid Profile: No results for input(s): CHOL, HDL,  LDLCALC, TRIG, CHOLHDL, LDLDIRECT in the last 72 hours. Anemia Panel: No results for input(s): VITAMINB12, FOLATE, FERRITIN, TIBC, IRON , RETICCTPCT in the last 72 hours. Urine analysis:    Component Value Date/Time   COLORURINE YELLOW 06/14/2016 1939   APPEARANCEUR CLEAR 06/14/2016 1939   LABSPEC 1.011 06/14/2016 1939   PHURINE 6.0 06/14/2016 1939   GLUCOSEU NEGATIVE 06/14/2016 1939   HGBUR NEGATIVE 06/14/2016 1939   BILIRUBINUR NEGATIVE 06/14/2016 1939   KETONESUR NEGATIVE 06/14/2016 1939   PROTEINUR 100 (A) 06/14/2016 1939   NITRITE NEGATIVE 06/14/2016 1939   LEUKOCYTESUR NEGATIVE 06/14/2016 1939   Sepsis Labs: Invalid input(s): PROCALCITONIN, LACTICIDVEN  Microbiology: No results found for this or any previous visit (from the past 240 hours).   Radiology Studies: No results found.     Nathan Campbell T. Griffey [facility]  If 7PM-7AM, please contact night-coverage [email or web address] 10/18/2023, 4:33 PM

## 2023-10-18 NOTE — Progress Notes (Signed)
 [facility] Progress Note   Subjective: Patient seen in HD today. He was able to gesture that his right foot was itching. He shakes his head when asked if he has dyspnea. BP stable during treatment. Next HD 10/20/23  Objective Vitals:   10/18/23 1130 10/18/23 1200 10/18/23 1247 10/18/23 1300  BP: 129/89 118/82 (!) 81/63 (!) 101/59  Pulse: 99 (!) 105 93 98  Resp: (!) 24 (!) 27 (!) 24 (!) 30  Temp:      TempSrc:      SpO2: 95% 94% 95% 97%  Weight:      Height:       Physical Exam General: Lying in bed, no distress, obese Heart: Normal rate, no rub Lungs: Bilateral chest rise with no increased work of breathing Abdomen: soft, distended Extremities: Trace edema in lower extremities, warm and well-perfused Dialysis Access: AVF +t/b  Additional Objective Labs: Basic Metabolic Panel: Recent Labs  Lab 10/13/23 0552 10/14/23 0618 10/15/23 0321 10/17/23 0117 10/18/23 1000  NA 142   < > 140 136 135  K 4.3   < > 4.5 4.3 5.8*  CL 95*   < > 92* 92* 89*  CO2 21*   < > 24 24 23   GLUCOSE 116*   < > 123* 121* 103*  BUN 87*   < > 105* 103* 139*  CREATININE 15.18*   < > 16.04* 14.89* 18.98*  CALCIUM  10.5*   < > 10.3 10.0 9.8  PHOS 11.4*  --   --   --  >30.0*   < > = values in this interval not displayed.     CBC: Recent Labs  Lab 10/13/23 0552 10/14/23 0618 10/15/23 0321 10/17/23 0117 10/18/23 1000  WBC 15.8* 16.2* 16.0* 12.3* 11.2*  HGB 10.9* 11.9* 10.7* 11.6* 10.9*  HCT 33.8* 38.2* 33.9* 36.0* 33.9*  MCV 99.4 100.5* 101.2* 99.7 98.5  PLT 301 350 358 363 384   Blood Culture    Component Value Date/Time   SDES TRACHEAL ASPIRATE 10/08/2023 0627   SPECREQUEST NONE 10/08/2023 0627   CULT  10/08/2023 0627    Normal respiratory flora-no Staph aureus or Pseudomonas seen Performed at [facility], [address] [place], [place] [address]    REPTSTATUS 10/10/2023 FINAL 10/08/2023 9372   Medications:  anticoagulant sodium citrate      feeding  supplement (OSMOLITE 1.5 CAL) 1,000 mL (10/17/23 2205)    amLODipine   10 mg Per Tube Daily   aspirin   81 mg Per Tube Daily   atorvastatin   40 mg Per Tube Daily   carvedilol   9.375 mg Per Tube BID   Chlorhexidine  Gluconate Cloth  6 each Topical Q0600   clopidogrel   75 mg Per Tube Daily   docusate  100 mg Per Tube BID   feeding supplement (PROSource TF20)  60 mL Per Tube Daily   free water   30 mL Per Tube Q6H   heparin   1,500 Units Intravenous Once   heparin  injection (subcutaneous)  5,000 Units Subcutaneous Q8H   insulin  aspart  0-6 Units Subcutaneous Q4H   isosorbide  dinitrate  5 mg Per Tube BID   lanthanum   1,000 mg Per Tube TID WC   mouth rinse  15 mL Mouth Rinse 4 times per day   pantoprazole  (PROTONIX ) IV  40 mg Intravenous QHS    Dialysis Orders TTS - AF 4:15hr, 500/600, EDW 138kg, 2K/2C bath, AVF, heparin  4000+ - Mircera 50mcg given 10/06/23 - Hectoral 7mcg IV q HD - Takes  Auryxia  3/meals and Xphozah 30mg  BID  Assessment/Plan: Acute R MCA CVA: S/p TNK 7/25. Required intubation, now on [place] O2. Ongoing L sided weakness. Per neuro. Working with PT currently.  AHRF, ?aspiration + CVA: Now extubated, on [place] O2. S/p Unasyn  course. ESRD: Continue HD on TTS schedule  HTN/volume: BP decent, occ high - UF as tolerated, keep lowering EDW. Anemia of ESRD: Hgb ~10.9 today, no ESA for now. Secondary HPTH: Corrected Ca 10.4 and Phos very high, VDRA on hold. Home auryxia  can not be given via NG tube - change to fosrenol  for now. Nutrition: Alb low, getting TF + protein supplements. Per RD - unable to do Nepro at this point, will change in future.  Belvie Och, NP 10/18/2023, 1:17 PM  [employer]

## 2023-10-18 NOTE — TOC Transition Note (Signed)
 Transition of Care [facility]) - Discharge Note   Patient Details  Name: Sawyer Mentzer MRN: [id number] Date of Birth: [birth year]  [facility] CM/SW Contact:  Andrez JULIANNA George, RN Phone Number: 10/18/2023, 2:56 PM   Clinical Narrative:     Pt is discharging to CIR today. CM signing off.   Final next level of care: IP Rehab Facility Barriers to Discharge: No Barriers Identified   Patient Goals and CMS Choice   CMS Medicare.gov Compare Post Acute Care list provided to:: Patient Choice offered to / list presented to : Patient [place] ownership interest in [facility].provided to:: Parent NA    Discharge Placement                       Discharge Plan and Services Additional resources added to the After Visit Summary for       Post Acute Care Choice: IP Rehab                               Social Drivers of Health (SDOH) Interventions SDOH Screenings   Food Insecurity: No Food Insecurity (03/26/2022)  Housing: Low Risk  (03/26/2022)  Transportation Needs: No Transportation Needs (03/26/2022)  Utilities: Not At Risk (03/26/2022)  Tobacco Use: High Risk (10/07/2023)     Readmission Risk Interventions     No data to display

## 2023-10-18 NOTE — Progress Notes (Signed)
 Off unit for Dialysis today. Eating lunch with assistance. 1.5L removed for 3.5hrs.  Denies pain. Family at bedside at time of transfer.

## 2023-10-18 NOTE — Progress Notes (Incomplete)
 Physical Exam General: No apparent distress, obese HEENT: Head is normocephalic, atraumatic, sclera anicteric, oral mucosa pink and moist Neck: Supple without JVD or lymphadenopathy Heart: Mild tachycardia. No murmurs rubs or gallops Chest: CTA bilaterally without wheezes, rales, or rhonchi; no distress.  2 L O2 [place].  Core track in place abdomen: Soft, non-tender, protuberant , bowel sounds positive. Extremities: No clubbing, cyanosis, or edema.  + Left FVF IV RUE Psych: Pt's affect is flat Skin: Clean and intact without signs of breakdown Neuro:     Mental Status: AAOx4, Drowsy, delayed responses Speech/Languate: + Moderate to severe dysarthria, usually answers simple yes no questions.  Answers 1 word at a time.  Slow rate of speech.  Limited verbal output.  Expressive greater than receptive aphasia, following most simple commands CRANIAL NERVES: II: PERRL III, IV, VI: EOM intact, right gaze preference V: Altered left face VII: left facial weakness VIII: normal hearing to speech IX, X: normal palatal elevation XI: 5/5 head turn and decreased L shoulder shrug XII: Tongue to left      MOTOR: RUE: 5/5 Deltoid, 5/5 Biceps, 5/5 Triceps,5/5 Grip LUE: 0/5 Deltoid, 0/5 Biceps, 2/5 Triceps, 1/5 Grip RLE: HF 5/5, KE 5/5, ADF and APF 4 out of 5 LLE: HF 3/5, KE 2+/5, ADF and APF 3 out of 5      REFLEXES: No ankle clonus   SENSORY: Normal to touch all 4 extremities   Coordination: Right upper extremity with no gross ataxia

## 2023-10-18 NOTE — Progress Notes (Signed)
 Received patient in bed to unit.  Alert and oriented.  Informed consent signed and in chart.   TX duration: 3.5 hours  Patient tolerated well.  Transported back to the room  Alert, without acute distress.  Hand-off given to patient's nurse.   Access used: Left upper arm AVF Access issues: None  Total UF removed: 1.3 Medication(s) given: none   10/18/23 1321  Vitals  Temp 98.2 F (36.8 C)  Temp Source Oral  BP 121/76  BP Location Right Wrist  BP Method Automatic  Patient Position (if appropriate) Lying  Pulse Rate 99  Pulse Rate Source Monitor  ECG Heart Rate 96  Resp (!) 29  Oxygen Therapy  SpO2 96 %  O2 Device Nasal Cannula  O2 Flow Rate (L/min) 2 L/min  During Treatment Monitoring  Duration of HD Treatment -hour(s) 3.5 hour(s)  HD Safety Checks Performed Yes  Intra-Hemodialysis Comments Tx completed  Post Treatment  Dialyzer Clearance Clear  Liters Processed 84  Fluid Removed (mL) 1300 mL  Tolerated HD Treatment Yes  Post-Hemodialysis Comments Patient experienced low BP and could not get to prescribed goal.  AVG/AVF Arterial Site Held (minutes) 7 minutes  AVG/AVF Venous Site Held (minutes) 7 minutes  Fistula / Graft Left Upper arm Arteriovenous fistula  Placement Date/Time: 11/11/20 0835   Placed prior to admission: No  Orientation: Left  Access Location: Upper arm  Access Type: (c) Arteriovenous fistula  Status Deaccessed     Neville Seip, RN Kidney Dialysis Unit

## 2023-10-18 NOTE — Progress Notes (Signed)
 Urbano Albright, MD  Physician Physical Medicine and Rehabilitation   Consult Note    Signed   Date of Service: 10/14/2023  1:58 PM  Related encounter: ED to Hosp-Admission (Current) from 10/07/2023 in [facility]   Signed     Expand All Collapse All           Physical Medicine and Rehabilitation Consult Reason for Consult: Consult  Referring Physician: Dr. Adolph     HPI: Nathan Campbell is a 48 y.o. male with past medical history of systolic and diastolic CHF, anemia, ESRD on hemodialysis, hypertension, obesity who presented for left-sided weakness and neglect.  In the ED he was agitated and restless, intubated for airway protection.  CT head with acute right MCA territory CVA in addition to a calcific density proximal right M1 segment concerning for calcific embolus.  Patient was given TNK and cerebral catheter angiogram showed right M1 occlusion and patient underwent thrombectomy of right M1 TICI 2B revascularization of occluded right middle cerebral artery M1 segment, the superior division and inferior division to the right M2 region. MRI showed large right MCA territory CVA, with associated diffuse cortical thickening and some mass effect, no significant midline shift.  MRI showed distal right M2 occlusion, no new occlusion.  Patient was extubated on 7/29.  On 7/30 patient was noted to have vomited and aspirated.  Had NG tube placed.  Abdomen x-ray indicated ileus, 8/1 having bowel movements.  Patient is n.p.o and tube feeds have been restarted.  Patient was seen by PT, OT and SLP and found to have functional deficits.  Patient ambulated 100 feet min assist.  Max to total assist for bathing and dressing.   Per chart review patient lives in a apartment with 1 flight of stairs to enter.  Patient indicates he lives with his sister but it does not sound like she would be able to provide significant assistance after discharge.  Patient indicates his mother may be able  to help him.   Home: Home Living Family/patient expects to be discharged to:: Private residence Living Arrangements: Alone Available Help at Discharge:  (unknown) Type of Home: Apartment Home Access: Stairs to enter Entrance Stairs-Number of Steps: 1 flight Entrance Stairs-Rails: Right, Left Home Layout: One level Home Equipment: None  Lives With:  (unknown)  Functional History: Prior Function Prior Level of Function : Independent/Modified Independent, Driving Mobility Comments: no use of DME ADLs Comments: independent, driving himself to HD Functional Status:  Mobility: Bed Mobility Overal bed mobility: Needs Assistance Bed Mobility: Rolling, Sidelying to Sit Rolling: Contact guard assist, Used rails Sidelying to sit: Min assist, HOB elevated, Used rails Supine to sit: Total assist, HOB elevated Sit to sidelying: Mod assist, +2 for safety/equipment General bed mobility comments: pt able to roll to left with cues to use rail and bring trunk fully over, min assist to clear RLE off bed and fully push trunk off surface with HOB 30 degrees Transfers Overall transfer level: Needs assistance Equipment used: None Transfers: Sit to/from Stand Sit to Stand: Min assist, +2 safety/equipment Bed to/from chair/wheelchair/BSC transfer type:: Step pivot Step pivot transfers: Mod assist General transfer comment: min +2 to stand from bed with lt knee blocked and bil HHA to pivot toward Rt to chair. pt able to stand from recliner with use of Rt armrest with min assist x 2 trials. Ambulation/Gait Ambulation/Gait assistance: Min assist, +2 safety/equipment Gait Distance (Feet): 100 Feet Assistive device: 2 person hand held assist Gait Pattern/deviations:  Trunk flexed, Step-through pattern, Narrow base of support General Gait Details: decreased left attention with step through gait, narrow BOS and bil UE supported on therapist and tech with chair follow. cues for direction and safety Gait  velocity interpretation: <1.8 ft/sec, indicate of risk for recurrent falls   ADL: ADL Overall ADL's : Needs assistance/impaired Eating/Feeding: NPO Grooming: Minimal assistance, Sitting Upper Body Bathing: Maximal assistance, Sitting Lower Body Bathing: Maximal assistance, Bed level Upper Body Dressing : Maximal assistance, Sitting Lower Body Dressing: Total assistance, Bed level Toilet Transfer: Maximal assistance, +2 for physical assistance, Total assistance Functional mobility during ADLs: Maximal assistance, +2 for physical assistance, Total assistance   Cognition: Cognition Overall Cognitive Status: No family/caregiver present to determine baseline cognitive functioning Arousal/Alertness:  (awake, struggling to manage secretions) Orientation Level: Oriented X4 Cognition Arousal: Alert Behavior During Therapy: Flat affect Overall Cognitive Status: No family/caregiver present to determine baseline cognitive functioning     Review of Systems  Constitutional:  Negative for fever.  Eyes:  Negative for blurred vision.  Respiratory:  Negative for shortness of breath.   Cardiovascular:  Negative for chest pain.  Gastrointestinal:  Negative for abdominal pain, nausea and vomiting.  Musculoskeletal:  Positive for myalgias.  Neurological:  Positive for sensory change, speech change and focal weakness.       Past Medical History:  Diagnosis Date   Acute combined systolic and diastolic CHF, NYHA class 4 (HCC) 06/10/2016   Anemia     Cardiomyopathy (HCC) 06/14/2016   CHF (congestive heart failure) (HCC)     COVID 10/2020   Dyspnea     ESRD on hemodialysis (HCC)      TTS at [facility]   Hypertension     Hypertensive heart and kidney disease with heart failure (HCC) 06/14/2016   Hypertensive heart disease with congestive heart failure (HCC)     Obesity               Past Surgical History:  Procedure Laterality Date   A/V FISTULAGRAM Left 05/03/2022    Procedure: A/V  Fistulagram;  Surgeon: Eliza Lonni RAMAN, MD;  Location: [facility] INVASIVE CV LAB;  Service: Cardiovascular;  Laterality: Left;   AV FISTULA PLACEMENT Left 11/11/2020    Procedure: LEFT ARM First Stage Basilic Vein Fistula Creation;  Surgeon: Sheree Penne Lonni, MD;  Location: [facility] OR;  Service: Vascular;  Laterality: Left;   BASCILIC VEIN TRANSPOSITION Left 01/23/2021    Procedure: Second Stage Basilic Vein Transposition of Left Arm Arteriovenous  Fistula;  Surgeon: Sheree Penne Lonni, MD;  Location: [facility] OR;  Service: Vascular;  Laterality: Left;  Block  to LMA    COLONOSCOPY       FISTULA SUPERFICIALIZATION Left 05/07/2022    Procedure: PLICATION OF ANEURYSM, LEFT ARM FISTULA;  Surgeon: Eliza Lonni RAMAN, MD;  Location: [facility] OR;  Service: Vascular;  Laterality: Left;   IR CT HEAD LTD   10/07/2023   IR CT HEAD LTD   10/07/2023   IR PATIENT EVAL TECH 0-60 MINS   10/07/2023   IR PERCUTANEOUS ART THROMBECTOMY/INFUSION INTRACRANIAL INC DIAG ANGIO   10/07/2023   NO PAST SURGERIES       PERIPHERAL VASCULAR BALLOON ANGIOPLASTY Left 05/03/2022    Procedure: PERIPHERAL VASCULAR BALLOON ANGIOPLASTY;  Surgeon: Eliza Lonni RAMAN, MD;  Location: [facility] INVASIVE CV LAB;  Service: Cardiovascular;  Laterality: Left;  AVF   RADIOLOGY WITH ANESTHESIA N/A 10/07/2023    Procedure: RADIOLOGY WITH ANESTHESIA;  Surgeon: Radiologist, Medication, MD;  Location: [facility]  OR;  Service: Radiology;  Laterality: N/A;             Family History  Problem Relation Age of Onset   Hypertension Mother     Diabetes Father     Hypertension Father     Hypertension Sister     Hypertension Paternal Grandmother     Diabetes Mellitus II Paternal Grandfather     Heart disease Cousin     Heart disease Cousin     Colon cancer Cousin     Esophageal cancer Neg Hx     Rectal cancer Neg Hx     Stomach cancer Neg Hx          Social History:  reports that he has been smoking cigarettes. He has never used smokeless tobacco. He  reports that he does not currently use alcohol. He reports that he does not currently use drugs. Allergies:  Allergies  No Known Allergies            Facility-Administered Medications Prior to Admission  Medication Dose Route Frequency Provider Last Rate Last Admin   0.9 %  sodium chloride  infusion  250 mL Intravenous PRN Sheree Penne Bruckner, MD   500 mL at 10/07/23 1044   sodium chloride  flush (NS) 0.9 % injection 3 mL  3 mL Intravenous Q12H Sheree Penne Bruckner, MD                Medications Prior to Admission  Medication Sig Dispense Refill   acetaminophen  (TYLENOL ) 325 MG tablet Take 2 tablets (650 mg total) by mouth every 6 (six) hours as needed for moderate pain. 30 tablet 0   allopurinol  (ZYLOPRIM ) 100 MG tablet Take 1 tablet (100 mg total) by mouth daily. Do not begin until pain in right knee is resolved. 30 tablet 1   amLODipine  (NORVASC ) 10 MG tablet Take 1 tablet (10 mg total) by mouth daily. 30 tablet 1   AURYXIA  1 GM 210 MG(Fe) tablet Take 420-630 mg by mouth See admin instructions. Take 630 mg with each meal and 420 to 630 mg with each snack       B Complex-C-Zn-Folic Acid (DIALYVITE 800/ZINC PO) Take 1 tablet by mouth daily.       benzonatate  (TESSALON ) 100 MG capsule Take 1 capsule (100 mg total) by mouth 3 (three) times daily as needed for cough. 30 capsule 0   carvedilol  (COREG ) 25 MG tablet Take 25 mg by mouth 2 (two) times daily.       guaiFENesin -dextromethorphan (ROBITUSSIN DM) 100-10 MG/5ML syrup Take 5 mLs by mouth 3 (three) times daily as needed for cough. 118 mL 0   isosorbide  mononitrate (ISMO ) 10 MG tablet Take 10 mg by mouth daily.       omeprazole  (PRILOSEC) 20 MG capsule Take 1 capsule (20 mg total) by mouth daily. Take after dialysis on dialysis days 30 capsule 0   promethazine -dextromethorphan (PROMETHAZINE -DM) 6.25-15 MG/5ML syrup Take 10 mLs by mouth every 6 (six) hours as needed for cough. 118 mL 0   torsemide  (DEMADEX ) 20 MG tablet Take 60 mg  by mouth 2 (two) times daily.              Blood pressure (!) 112/39, pulse 99, temperature 99.4 F (37.4 C), temperature source Oral, resp. rate (!) 29, height 6' (1.829 m), weight 129.3 kg, SpO2 91%. Physical Exam General: No apparent distress, obese HEENT: Head is normocephalic, atraumatic, sclera anicteric, oral mucosa pink and moist Neck: Supple without JVD or lymphadenopathy  Heart: Mild tachycardia. No murmurs rubs or gallops Chest: CTA bilaterally without wheezes, rales, or rhonchi; no distress.  2 L O2 [place].  Core track in place abdomen: Soft, non-tender, protuberant , bowel sounds positive. Extremities: No clubbing, cyanosis, or edema.  + Left FVF Psych: Pt's affect is flat Skin: Clean and intact without signs of breakdown Neuro:     Mental Status: AAOx4, Drowsy, delayed responses Speech/Languate: + Moderate to severe dysarthria, usually answers simple yes no questions.  Answers 1 word at a time.  Slow rate of speech.  Limited verbal output.  Expressive greater than receptive aphasia, following most simple commands CRANIAL NERVES: II: PERRL III, IV, VI: EOM intact, right gaze preference V: Altered left face VII: left facial weakness VIII: normal hearing to speech IX, X: normal palatal elevation XI: 5/5 head turn and decreased L shoulder shrug XII: Tongue to left      MOTOR: RUE: 5/5 Deltoid, 5/5 Biceps, 5/5 Triceps,5/5 Grip LUE: 1/5 Deltoid, 1/5 Biceps, 4/5 Triceps, 0/5 Grip RLE: HF 5/5, KE 5/5, ADF and APF at least 4 out of 5 LLE: HF 4/5, KE 4/5, ADF and APF 1 out of 5 however unsure if this is due to difficulty following commands     REFLEXES: No ankle clonus   SENSORY: Normal to touch all 4 extremities   Coordination: Right upper extremity with no gross ataxia       Lab Results Last 24 Hours       Results for orders placed or performed during the hospital encounter of 10/07/23 (from the past 24 hours)  Glucose, capillary     Status: Abnormal    Collection  Time: 10/13/23  3:45 PM  Result Value Ref Range    Glucose-Capillary 122 (H) 70 - 99 mg/dL  Glucose, capillary     Status: Abnormal    Collection Time: 10/13/23  7:33 PM  Result Value Ref Range    Glucose-Capillary 112 (H) 70 - 99 mg/dL  Glucose, capillary     Status: Abnormal    Collection Time: 10/13/23 11:44 PM  Result Value Ref Range    Glucose-Capillary 125 (H) 70 - 99 mg/dL  Glucose, capillary     Status: Abnormal    Collection Time: 10/14/23  3:26 AM  Result Value Ref Range    Glucose-Capillary 116 (H) 70 - 99 mg/dL  CBC     Status: Abnormal    Collection Time: 10/14/23  6:18 AM  Result Value Ref Range    WBC 16.2 (H) 4.0 - 10.5 K/uL    RBC 3.80 (L) 4.22 - 5.81 MIL/uL    Hemoglobin 11.9 (L) 13.0 - 17.0 g/dL    HCT 61.7 (L) 60.9 - 52.0 %    MCV 100.5 (H) 80.0 - 100.0 fL    MCH 31.3 26.0 - 34.0 pg    MCHC 31.2 30.0 - 36.0 g/dL    RDW 86.7 88.4 - 84.4 %    Platelets 350 150 - 400 K/uL    nRBC 0.1 0.0 - 0.2 %  Basic metabolic panel with GFR     Status: Abnormal    Collection Time: 10/14/23  6:18 AM  Result Value Ref Range    Sodium 140 135 - 145 mmol/L    Potassium 4.3 3.5 - 5.1 mmol/L    Chloride 91 (L) 98 - 111 mmol/L    CO2 25 22 - 32 mmol/L    Glucose, Bld 114 (H) 70 - 99 mg/dL    BUN 72 (H)  6 - 20 mg/dL    Creatinine, Ser 86.73 (H) 0.61 - 1.24 mg/dL    Calcium  10.8 (H) 8.9 - 10.3 mg/dL    GFR, Estimated 4 (L) >60 mL/min    Anion gap 24 (H) 5 - 15  Glucose, capillary     Status: Abnormal    Collection Time: 10/14/23  7:34 AM  Result Value Ref Range    Glucose-Capillary 107 (H) 70 - 99 mg/dL  Glucose, capillary     Status: Abnormal    Collection Time: 10/14/23 12:13 PM  Result Value Ref Range    Glucose-Capillary 122 (H) 70 - 99 mg/dL       Imaging Results (Last 48 hours)  DG Abd Portable 1V Result Date: 10/14/2023 CLINICAL DATA:  [id number] Encounter for feeding tube placement [id number] EXAM: PORTABLE ABDOMEN - 1 VIEW COMPARISON:  10/12/2023 abdomen radiograph  FINDINGS: Enteric tube courses into the distal stomach with weighted tip in the medial right abdomen likely in the region of the pylorus. No evidence of pneumoperitoneum. IMPRESSION: Enteric tube courses into the distal stomach with weighted tip in the medial right abdomen likely in the region of the pylorus. Electronically Signed   By: Selinda DELENA Blue M.D.   On: 10/14/2023 10:04       Assessment/Plan: Diagnosis: Right MCA CVA s/p mechanical thrombectomy and TNK Does the need for close, 24 hr/day medical supervision in concert with the patient's rehab needs make it unreasonable for this patient to be served in a less intensive setting? Yes Co-Morbidities requiring supervision/potential complications:  - Systolic and diastolic heart failure, hypertension, pulmonary hypertension, hyperlipidemia, elevated triglycerides, dysphagia, ileus, ESRD on HD, obesity Due to bladder management, bowel management, safety, skin/wound care, disease management, medication administration, pain management, and patient education, does the patient require 24 hr/day rehab nursing? Yes Does the patient require coordinated care of a physician, rehab nurse, therapy disciplines of PT, OT, SLP to address physical and functional deficits in the context of the above medical diagnosis(es)? Yes Addressing deficits in the following areas: balance, endurance, locomotion, strength, transferring, bowel/bladder control, bathing, dressing, feeding, grooming, toileting, cognition, speech, language, swallowing, and psychosocial support Can the patient actively participate in an intensive therapy program of at least 3 hrs of therapy per day at least 5 days per week? Yes The potential for patient to make measurable gains while on inpatient rehab is excellent Anticipated functional outcomes upon discharge from inpatient rehab are supervision and min assist  with PT, supervision and min assist with OT, supervision and min assist with SLP. Estimated  rehab length of stay to reach the above functional goals is: 18-22 Anticipated discharge destination: Other Overall Rehab/Functional Prognosis: good   POST ACUTE RECOMMENDATIONS: This patient's condition is appropriate for continued rehabilitative care in the following setting: CIR and SNF Patient has agreed to participate in recommended program. Yes Note that insurance prior authorization may be required for reimbursement for recommended care.   Comment: I think patient may be a candidate for CIR if he has family support on discharge.  Rehab coordinator to follow-up.  Otherwise he would likely be candidate for SNF rehabilitation.     MEDICAL RECOMMENDATIONS: Consider left  WHO/PRAFO     I have personally performed a face to face diagnostic evaluation of this patient. Additionally, I have examined the patient's medical record including any pertinent labs and radiographic images.     Thanks,   Murray Collier, MD 10/14/2023           Routing History

## 2023-10-18 NOTE — Progress Notes (Signed)
 PT Cancellation Note  Patient Details Name: Nathan Campbell MRN: [id number] DOB: 06-08-1975   Cancelled Treatment:    Reason Eval/Treat Not Completed: (P) Patient at procedure or test/unavailable (Pt off floor at HD. Will follow up as able.)   Darryle George 10/18/2023, 10:42 AM

## 2023-10-18 NOTE — Progress Notes (Signed)
 Inpatient Rehab Admissions Coordinator:    I have insurance approval and a bed available for pt to admit to CIR today. Dr. Jerri in agreement and [facility] aware.  I've notified pt/family and they are in agreement to admit today.  I will make arrangements.    Reche Lowers, PT, DPT Admissions Coordinator [phone] 10/18/23  10:19 AM

## 2023-10-19 ENCOUNTER — Inpatient Hospital Stay (HOSPITAL_COMMUNITY)

## 2023-10-19 ENCOUNTER — Encounter (HOSPITAL_COMMUNITY): Payer: Self-pay | Admitting: Physical Medicine & Rehabilitation

## 2023-10-19 DIAGNOSIS — R1312 Dysphagia, oropharyngeal phase: Secondary | ICD-10-CM | POA: Diagnosis not present

## 2023-10-19 DIAGNOSIS — E875 Hyperkalemia: Secondary | ICD-10-CM

## 2023-10-19 DIAGNOSIS — N186 End stage renal disease: Secondary | ICD-10-CM | POA: Diagnosis not present

## 2023-10-19 DIAGNOSIS — I63511 Cerebral infarction due to unspecified occlusion or stenosis of right middle cerebral artery: Secondary | ICD-10-CM | POA: Diagnosis not present

## 2023-10-19 LAB — GLUCOSE, CAPILLARY
Glucose-Capillary: 102 mg/dL — ABNORMAL HIGH (ref 70–99)
Glucose-Capillary: 121 mg/dL — ABNORMAL HIGH (ref 70–99)
Glucose-Capillary: 121 mg/dL — ABNORMAL HIGH (ref 70–99)
Glucose-Capillary: 121 mg/dL — ABNORMAL HIGH (ref 70–99)
Glucose-Capillary: 138 mg/dL — ABNORMAL HIGH (ref 70–99)
Glucose-Capillary: 98 mg/dL (ref 70–99)

## 2023-10-19 MED ORDER — ANTICOAGULANT SODIUM CITRATE 4% (200MG/5ML) IV SOLN: 5.0000 mL | Status: DC | PRN

## 2023-10-19 MED ORDER — OSMOLITE 1.5 CAL PO LIQD
1000.0000 mL | ORAL | Status: DC
  Administered 2023-10-19: 1000 mL
  Filled 2023-10-19 (×2): qty 1000

## 2023-10-19 MED ORDER — ASPIRIN 81 MG PO CHEW
81.0000 mg | CHEWABLE_TABLET | Freq: Every day | ORAL | Status: DC
  Administered 2023-10-19 – 2023-11-04 (×20): 81 mg via ORAL
  Filled 2023-10-19 (×16): qty 1

## 2023-10-19 MED ORDER — HEPARIN SODIUM (PORCINE) 5000 UNIT/ML IJ SOLN
5000.0000 [IU] | Freq: Three times a day (TID) | INTRAMUSCULAR | Status: DC
  Administered 2023-10-19 – 2023-10-26 (×25): 5000 [IU] via SUBCUTANEOUS
  Filled 2023-10-19 (×19): qty 1

## 2023-10-19 MED ORDER — PANTOPRAZOLE SODIUM 40 MG IV SOLR
40.0000 mg | Freq: Every day | INTRAVENOUS | Status: DC
  Administered 2023-10-19 – 2023-10-20 (×2): 40 mg via INTRAVENOUS
  Filled 2023-10-19: qty 10

## 2023-10-19 MED ORDER — ATORVASTATIN CALCIUM 40 MG PO TABS
40.0000 mg | ORAL_TABLET | Freq: Every day | ORAL | Status: DC
  Administered 2023-10-19 – 2023-11-04 (×20): 40 mg via ORAL
  Filled 2023-10-19 (×16): qty 1

## 2023-10-19 MED ORDER — ATORVASTATIN CALCIUM 40 MG PO TABS
40.0000 mg | ORAL_TABLET | Freq: Every day | ORAL | Status: DC
  Filled 2023-10-19: qty 1

## 2023-10-19 MED ORDER — LANTHANUM CARBONATE 500 MG PO CHEW
1000.0000 mg | CHEWABLE_TABLET | Freq: Three times a day (TID) | ORAL | Status: DC
  Administered 2023-10-19 – 2023-10-29 (×37): 1000 mg via ORAL
  Filled 2023-10-19 (×32): qty 2

## 2023-10-19 MED ORDER — CHLORHEXIDINE GLUCONATE CLOTH 2 % EX PADS
6.0000 | MEDICATED_PAD | Freq: Every day | CUTANEOUS | Status: DC
  Administered 2023-10-19 – 2023-10-20 (×2): 6 via TOPICAL

## 2023-10-19 MED ORDER — LIDOCAINE HCL (PF) 1 % IJ SOLN: 5.0000 mL | INTRAMUSCULAR | Status: DC | PRN

## 2023-10-19 MED ORDER — INSULIN ASPART 100 UNIT/ML IJ SOLN: 0.0000 [IU] | INTRAMUSCULAR | Status: DC

## 2023-10-19 MED ORDER — BISACODYL 5 MG PO TBEC: 5.0000 mg | DELAYED_RELEASE_TABLET | Freq: Every day | ORAL | Status: DC | PRN

## 2023-10-19 MED ORDER — HEPARIN SODIUM (PORCINE) 1000 UNIT/ML DIALYSIS: 1000.0000 [IU] | INTRAMUSCULAR | Status: DC | PRN

## 2023-10-19 MED ORDER — ASPIRIN 81 MG PO CHEW: 81.0000 mg | CHEWABLE_TABLET | Freq: Every day | ORAL | Status: DC

## 2023-10-19 MED ORDER — ISOSORBIDE DINITRATE 5 MG PO TABS
5.0000 mg | ORAL_TABLET | Freq: Two times a day (BID) | ORAL | Status: DC
  Administered 2023-10-19 – 2023-10-26 (×19): 5 mg via ORAL
  Filled 2023-10-19 (×15): qty 1

## 2023-10-19 MED ORDER — ORAL CARE MOUTH RINSE: 15.0000 mL | OROMUCOSAL | Status: DC

## 2023-10-19 MED ORDER — ALTEPLASE 2 MG IJ SOLR: 2.0000 mg | Freq: Once | INTRAMUSCULAR | Status: DC | PRN

## 2023-10-19 MED ORDER — SODIUM ZIRCONIUM CYCLOSILICATE 10 G PO PACK
10.0000 g | PACK | Freq: Two times a day (BID) | ORAL | Status: AC
  Administered 2023-10-19 (×2): 10 g via ORAL
  Filled 2023-10-19 (×3): qty 1

## 2023-10-19 MED ORDER — ORAL CARE MOUTH RINSE
15.0000 mL | OROMUCOSAL | Status: DC
  Administered 2023-10-19 – 2023-11-04 (×61): 15 mL via OROMUCOSAL

## 2023-10-19 MED ORDER — LIDOCAINE-PRILOCAINE 2.5-2.5 % EX CREA: 1.0000 | TOPICAL_CREAM | CUTANEOUS | Status: DC | PRN

## 2023-10-19 MED ORDER — PENTAFLUOROPROP-TETRAFLUOROETH EX AERO: 1.0000 | INHALATION_SPRAY | CUTANEOUS | Status: DC | PRN

## 2023-10-19 MED ORDER — CYCLOBENZAPRINE HCL 5 MG PO TABS: 5.0000 mg | ORAL_TABLET | Freq: Three times a day (TID) | ORAL | Status: DC | PRN

## 2023-10-19 MED ORDER — DOCUSATE SODIUM 50 MG/5ML PO LIQD: 100.0000 mg | Freq: Two times a day (BID) | ORAL | Status: DC

## 2023-10-19 MED ORDER — ISOSORBIDE DINITRATE 5 MG PO TABS
5.0000 mg | ORAL_TABLET | Freq: Two times a day (BID) | ORAL | Status: DC
  Filled 2023-10-19: qty 1

## 2023-10-19 MED ORDER — AMLODIPINE BESYLATE 10 MG PO TABS
10.0000 mg | ORAL_TABLET | Freq: Every day | ORAL | Status: DC
  Administered 2023-10-19 – 2023-10-23 (×4): 10 mg via ORAL
  Filled 2023-10-19: qty 1
  Filled 2023-10-19: qty 2
  Filled 2023-10-19: qty 1

## 2023-10-19 MED ORDER — CHLORHEXIDINE GLUCONATE CLOTH 2 % EX PADS: 6.0000 | MEDICATED_PAD | Freq: Every day | CUTANEOUS | Status: DC

## 2023-10-19 MED ORDER — ASPIRIN 81 MG PO CHEW
81.0000 mg | CHEWABLE_TABLET | Freq: Every day | ORAL | Status: DC
  Filled 2023-10-19: qty 1

## 2023-10-19 MED ORDER — ASPIRIN 81 MG PO CHEW: 162.0000 mg | CHEWABLE_TABLET | Freq: Every day | ORAL | Status: DC

## 2023-10-19 MED ORDER — CLOPIDOGREL BISULFATE 75 MG PO TABS
75.0000 mg | ORAL_TABLET | Freq: Every day | ORAL | Status: DC
  Filled 2023-10-19: qty 1

## 2023-10-19 MED ORDER — CLOPIDOGREL BISULFATE 75 MG PO TABS
75.0000 mg | ORAL_TABLET | Freq: Every day | ORAL | Status: DC
  Administered 2023-10-19 – 2023-11-04 (×20): 75 mg via ORAL
  Filled 2023-10-19 (×16): qty 1

## 2023-10-19 MED ORDER — AMLODIPINE BESYLATE 10 MG PO TABS
10.0000 mg | ORAL_TABLET | Freq: Every day | ORAL | Status: DC
  Filled 2023-10-19: qty 1

## 2023-10-19 MED ORDER — DOCUSATE SODIUM 50 MG/5ML PO LIQD
100.0000 mg | Freq: Two times a day (BID) | ORAL | Status: DC
  Administered 2023-10-19 – 2023-11-03 (×33): 100 mg via ORAL
  Filled 2023-10-19 (×29): qty 10

## 2023-10-19 MED ORDER — CARVEDILOL 6.25 MG PO TABS: 9.3750 mg | ORAL_TABLET | Freq: Two times a day (BID) | ORAL | Status: DC

## 2023-10-19 MED ORDER — CYCLOBENZAPRINE HCL 5 MG PO TABS
5.0000 mg | ORAL_TABLET | Freq: Three times a day (TID) | ORAL | Status: DC | PRN
  Administered 2023-10-29 – 2023-11-03 (×2): 5 mg via ORAL
  Filled 2023-10-19 (×3): qty 1

## 2023-10-19 MED ORDER — ORAL CARE MOUTH RINSE: 15.0000 mL | OROMUCOSAL | Status: DC | PRN

## 2023-10-19 MED ORDER — CARVEDILOL 6.25 MG PO TABS
9.3750 mg | ORAL_TABLET | Freq: Two times a day (BID) | ORAL | Status: DC
  Administered 2023-10-19 (×2): 9.375 mg via ORAL
  Filled 2023-10-19 (×2): qty 1

## 2023-10-19 MED ORDER — LANTHANUM CARBONATE 500 MG PO CHEW
1000.0000 mg | CHEWABLE_TABLET | Freq: Three times a day (TID) | ORAL | Status: DC
  Filled 2023-10-19: qty 2

## 2023-10-19 NOTE — Progress Notes (Signed)
 Inpatient Rehabilitation Admission Medication Review by a Pharmacist  A complete drug regimen review was completed for this patient to identify any potential clinically significant medication issues.  High Risk Drug Classes Is patient taking? Indication by Medication  Antipsychotic No   Anticoagulant Yes Sq heparin  - VTE ppx  Antibiotic No   Opioid No   Antiplatelet Yes Clopidogrel , Aspirin  x 3 months then aspirin  alone - CVA  Hypoglycemics/insulin  Yes Insulin  - DM  Vasoactive Medication Yes Amlodipine , Carvedilol - HTN Isordil  - CAD  Chemotherapy No   Other Yes Atorvastatin  - HLD Pantoprazole  - Reflux  Lanthanum  - ESRD Cyclobenzaprine  prn spasms     Type of Medication Issue Identified Description of Issue Recommendation(s)  Drug Interaction(s) (clinically significant)     Duplicate Therapy     Allergy     No Medication Administration End Date     Incorrect Dose     Additional Drug Therapy Needed     Significant med changes from prior encounter (inform family/care partners about these prior to discharge).    Other       Clinically significant medication issues were identified that warrant physician communication and completion of prescribed/recommended actions by midnight of the next day:  No  Name of provider notified for urgent issues identified:   Provider Method of Notification:     Pharmacist comments: None  Time spent performing this drug regimen review (minutes):  20 minutes  Thank you. Olam Monte, PharmD

## 2023-10-19 NOTE — Progress Notes (Signed)
 Inpatient Rehabilitation Care Coordinator Assessment and Plan Patient Details  Name: Nathan Campbell MRN: [id number] Date of Birth: 05/14/1975  Today's Date: 10/19/2023  Hospital Problems: Active Problems:   * No active hospital problems. *  Past Medical History:  Past Medical History:  Diagnosis Date   Acute combined systolic and diastolic CHF, NYHA class 4 (HCC) 06/10/2016   Anemia    Cardiomyopathy (HCC) 06/14/2016   CHF (congestive heart failure) (HCC)    COVID 10/2020   Dyspnea    ESRD on hemodialysis (HCC)    TTS at [facility]   Hypertension    Hypertensive heart and kidney disease with heart failure (HCC) 06/14/2016   Hypertensive heart disease with congestive heart failure (HCC)    Obesity    Past Surgical History:  Past Surgical History:  Procedure Laterality Date   A/V FISTULAGRAM Left 05/03/2022   Procedure: A/V Fistulagram;  Surgeon: Nathan Campbell RAMAN, MD;  Location: [facility] INVASIVE CV LAB;  Service: Cardiovascular;  Laterality: Left;   AV FISTULA PLACEMENT Left 11/11/2020   Procedure: LEFT ARM First Stage Basilic Vein Fistula Creation;  Surgeon: Nathan Penne Lonni, MD;  Location: [facility] OR;  Service: Vascular;  Laterality: Left;   BASCILIC VEIN TRANSPOSITION Left 01/23/2021   Procedure: Second Stage Basilic Vein Transposition of Left Arm Arteriovenous  Fistula;  Surgeon: Nathan Penne Lonni, MD;  Location: [facility] OR;  Service: Vascular;  Laterality: Left;  Block  to LMA    COLONOSCOPY     FISTULA SUPERFICIALIZATION Left 05/07/2022   Procedure: PLICATION OF ANEURYSM, LEFT ARM FISTULA;  Surgeon: Nathan Campbell RAMAN, MD;  Location: [facility] OR;  Service: Vascular;  Laterality: Left;   IR CT HEAD LTD  10/07/2023   IR CT HEAD LTD  10/07/2023   IR PATIENT EVAL TECH 0-60 MINS  10/07/2023   IR PERCUTANEOUS ART THROMBECTOMY/INFUSION INTRACRANIAL INC DIAG ANGIO  10/07/2023   NO PAST SURGERIES     PERIPHERAL VASCULAR BALLOON ANGIOPLASTY Left 05/03/2022   Procedure: PERIPHERAL  VASCULAR BALLOON ANGIOPLASTY;  Surgeon: Nathan Campbell RAMAN, MD;  Location: [facility] INVASIVE CV LAB;  Service: Cardiovascular;  Laterality: Left;  AVF   RADIOLOGY WITH ANESTHESIA N/A 10/07/2023   Procedure: RADIOLOGY WITH ANESTHESIA;  Surgeon: Radiologist, Medication, MD;  Location: [facility] OR;  Service: Radiology;  Laterality: N/A;   Social History:  reports that he has been smoking cigarettes. He has never used smokeless tobacco. He reports that he does not currently use alcohol. He reports that he does not currently use drugs.  Family / Support Systems Marital Status: Single Patient Roles: Parent Spouse/Significant Other: N/A Children: Adult dtr- just graduated high school Other Supports: mother- Nathan Campbell Anticipated Caregiver: Mother Ability/Limitations of Caregiver: Per EMR, pt will d/c to home and his mother. SW will confirm support Caregiver Availability: 24/7 Family Dynamics: Pt lives alone  Social History Preferred language: English Religion: Christian Cultural Background: Pt is disabled due to dialysis and also working for [employer] for the last 2 years Education: GED Health Literacy - How often do you need to have someone help you when you read instructions, pamphlets, or other written material from your doctor or pharmacy?: Never Writes: Yes Employment Status: Disabled Marine scientist Issues: Pt reports DWI over 20 years ago and nothing recent Guardian/Conservator: N/A   Abuse/Neglect Abuse/Neglect Assessment Can Be Completed: Yes Physical Abuse: Denies Verbal Abuse: Denies Sexual Abuse: Denies Exploitation of patient/patient's resources: Denies Self-Neglect: Denies  Patient response to: Social Isolation - How often do you feel lonely or isolated  from those around you?: Never  Emotional Status Pt's affect, behavior and adjustment status: Pt in good spirits at time of visit. Reports some depression since being here and before he came into the hospital due  to life circumstsances. Recent Psychosocial Issues: Depression ongoing since being here Psychiatric History: Hx of depression. Substance Abuse History: Denies  Patient / Family Perceptions, Expectations & Goals Pt/Family understanding of illness & functional limitations: Pt has a general understanding of care needs Premorbid pt/family roles/activities: Independent Anticipated changes in roles/activities/participation: Assistance with ADLs/IADLs Pt/family expectations/goals: Pt goal is to work on depression, getting vocals back, R arm,and walking.  Community Resources [employer]: None Premorbid Home Care/DME Agencies: None Transportation available at discharge: TBD Is the patient able to respond to transportation needs?: Yes In the past 12 months, has lack of transportation kept you from medical appointments or from getting medications?: No In the past 12 months, has lack of transportation kept you from meetings, work, or from getting things needed for daily living?: No Resource referrals recommended: Neuropsychology  Discharge Planning Living Arrangements: Alone, Parent Support Systems: Parent Type of Residence: Private residence Insurance Resources: Media planner (specify) ([employer] Medicare) Financial Resources: SSD Financial Screen Referred: No Living Expenses: Rent Money Management: Patient Does the patient have any problems obtaining your medications?: No Home Management: Pt managed all home care needs Patient/Family Preliminary Plans: TBD Care Coordinator Barriers to Discharge: Decreased caregiver support, Lack of/limited family support, Insurance for SNF coverage Care Coordinator Anticipated Follow Up Needs: HH/OP  Clinical Impression SW met with pt during lunch and session with SLP. Pt is not a Cytogeneticist. No HCPOA. No DME. He is aware SW will follow-up with his mother to introduce self.   Nathan Campbell 10/19/2023, 2:36 PM

## 2023-10-19 NOTE — Progress Notes (Signed)
 Inpatient Rehabilitation  Patient information reviewed and entered into eRehab system by Feliberto Gottron, M.A., CCC-SLP, Rehab Quality Coordinator.  Information including medical coding, functional ability and quality indicators will be reviewed and updated through discharge.

## 2023-10-19 NOTE — Plan of Care (Signed)
  Problem: RH Balance Goal: LTG Patient will maintain dynamic standing with ADLs (OT) Description: LTG:  Patient will maintain dynamic standing balance with assist during activities of daily living (OT)  Flowsheets (Taken 10/19/2023 1311) LTG: Pt will maintain dynamic standing balance during ADLs with: Supervision/Verbal cueing   Problem: Sit to Stand Goal: LTG:  Patient will perform sit to stand in prep for activites of daily living with assistance level (OT) Description: LTG:  Patient will perform sit to stand in prep for activites of daily living with assistance level (OT) Flowsheets (Taken 10/19/2023 1311) LTG: PT will perform sit to stand in prep for activites of daily living with assistance level: Supervision/Verbal cueing   Problem: RH Grooming Goal: LTG Patient will perform grooming w/assist,cues/equip (OT) Description: LTG: Patient will perform grooming with assist, with/without cues using equipment (OT) Flowsheets (Taken 10/19/2023 1311) LTG: Pt will perform grooming with assistance level of: Independent   Problem: RH Bathing Goal: LTG Patient will bathe all body parts with assist levels (OT) Description: LTG: Patient will bathe all body parts with assist levels (OT) Flowsheets (Taken 10/19/2023 1311) LTG: Pt will perform bathing with assistance level/cueing: Supervision/Verbal cueing   Problem: RH Dressing Goal: LTG Patient will perform upper body dressing (OT) Description: LTG Patient will perform upper body dressing with assist, with/without cues (OT). Flowsheets (Taken 10/19/2023 1311) LTG: Pt will perform upper body dressing with assistance level of: Set up assist Goal: LTG Patient will perform lower body dressing w/assist (OT) Description: LTG: Patient will perform lower body dressing with assist, with/without cues in positioning using equipment (OT) Flowsheets (Taken 10/19/2023 1311) LTG: Pt will perform lower body dressing with assistance level of: Supervision/Verbal cueing    Problem: RH Toileting Goal: LTG Patient will perform toileting task (3/3 steps) with assistance level (OT) Description: LTG: Patient will perform toileting task (3/3 steps) with assistance level (OT)  Flowsheets (Taken 10/19/2023 1311) LTG: Pt will perform toileting task (3/3 steps) with assistance level: Supervision/Verbal cueing   Problem: RH Functional Use of Upper Extremity Goal: LTG Patient will use RT/LT upper extremity as a (OT) Description: LTG: Patient will use right/left upper extremity as a stabilizer/gross assist/diminished/nondominant/dominant level with assist, with/without cues during functional activity (OT) Flowsheets (Taken 10/19/2023 1311) LTG: Use of upper extremity in functional activities: LUE as a stabilizer LTG: Pt will use upper extremity in functional activity with assistance level of: Independent   Problem: RH Toilet Transfers Goal: LTG Patient will perform toilet transfers w/assist (OT) Description: LTG: Patient will perform toilet transfers with assist, with/without cues using equipment (OT) Flowsheets (Taken 10/19/2023 1311) LTG: Pt will perform toilet transfers with assistance level of: Supervision/Verbal cueing   Problem: RH Tub/Shower Transfers Goal: LTG Patient will perform tub/shower transfers w/assist (OT) Description: LTG: Patient will perform tub/shower transfers with assist, with/without cues using equipment (OT) Flowsheets (Taken 10/19/2023 1311) LTG: Pt will perform tub/shower stall transfers with assistance level of: Contact Guard/Touching assist

## 2023-10-19 NOTE — Plan of Care (Signed)
  Problem: RH Swallowing Goal: LTG Patient will consume least restrictive diet using compensatory strategies with assistance (SLP) Description: LTG:  Patient will consume least restrictive diet using compensatory strategies with assistance (SLP) Flowsheets (Taken 10/19/2023 1349) LTG: Pt Patient will consume least restrictive diet using compensatory strategies with assistance of (SLP): Supervision   Problem: RH Expression Communication Goal: LTG Patient will increase speech intelligibility (SLP) Description: LTG: Patient will increase speech intelligibility at word/phrase/conversation level with cues, % of the time (SLP) Flowsheets (Taken 10/19/2023 1349) LTG: Patient will increase speech intelligibility (SLP): Minimal Assistance - Patient > 75% Level: Phrase Percent of time patient will use intelligible speech: 80   Problem: RH Problem Solving Goal: LTG Patient will demonstrate problem solving for (SLP) Description: LTG:  Patient will demonstrate problem solving for basic/complex daily situations with cues  (SLP) Flowsheets (Taken 10/19/2023 1349) LTG: Patient will demonstrate problem solving for (SLP): Basic daily situations LTG Patient will demonstrate problem solving for: Minimal Assistance - Patient > 75%   Problem: RH Memory Goal: LTG Patient will use memory compensatory aids to (SLP) Description: LTG:  Patient will use memory compensatory aids to recall biographical/new, daily complex information with cues (SLP) Flowsheets (Taken 10/19/2023 1349) LTG: Patient will use memory compensatory aids to (SLP): Minimal Assistance - Patient > 75%   Problem: RH Attention Goal: LTG Patient will demonstrate this level of attention during functional activites (SLP) Description: LTG:  Patient will will demonstrate this level of attention during functional activites (SLP) Flowsheets (Taken 10/19/2023 1349) Patient will demonstrate during cognitive/linguistic activities the attention type of:  Sustained LTG: Patient will demonstrate this level of attention during cognitive/linguistic activities with assistance of (SLP): Minimal Assistance - Patient > 75%   Problem: RH Awareness Goal: LTG: Patient will demonstrate awareness during functional activites type of (SLP) Description: LTG: Patient will demonstrate awareness during functional activites type of (SLP) Flowsheets (Taken 10/19/2023 1349) Patient will demonstrate during cognitive/linguistic activities awareness type of: Intellectual LTG: Patient will demonstrate awareness during cognitive/linguistic activities with assistance of (SLP): Minimal Assistance - Patient > 75%

## 2023-10-19 NOTE — Evaluation (Signed)
 Occupational Therapy Assessment and Plan  Patient Details  Name: Nathan Campbell MRN: [id number] Date of Birth: 01/17/1976  OT Diagnosis: cognitive deficits, disturbance of vision, and hemiplegia affecting non-dominant side Rehab Potential: Rehab Potential (ACUTE ONLY): Good ELOS: 20-21 days   Today's Date: 10/19/2023 OT Individual Time: 1030-1140 OT Individual Time Calculation (min): 70 min     Pt seen for initial evaluation and ADL training with a focus on  adaptive strategies to compensate for L hemiplegia.  Pt participated in self care of toileting, bathing and dressing.  He did demonstrate activity in proximal shoulder.  Pt very tired but able to stay awake for session.   Will attempt to do a shower tomorrow.   Great participation as pt is highly motivated.  Provided pt with half lap tray.   Reviewed role of OT, discussed POC and pt's goals, and ELOS. Pt resting in wc With all needs met and alarm set.     Hospital Problem: Active Problems:   * No active hospital problems. *   Past Medical History:  Past Medical History:  Diagnosis Date   Acute combined systolic and diastolic CHF, NYHA class 4 (HCC) 06/10/2016   Anemia    Cardiomyopathy (HCC) 06/14/2016   CHF (congestive heart failure) (HCC)    COVID 10/2020   Dyspnea    ESRD on hemodialysis (HCC)    TTS at [facility]   Hypertension    Hypertensive heart and kidney disease with heart failure (HCC) 06/14/2016   Hypertensive heart disease with congestive heart failure (HCC)    Obesity    Past Surgical History:  Past Surgical History:  Procedure Laterality Date   A/V FISTULAGRAM Left 05/03/2022   Procedure: A/V Fistulagram;  Surgeon: Eliza Lonni RAMAN, MD;  Location: [facility] INVASIVE CV LAB;  Service: Cardiovascular;  Laterality: Left;   AV FISTULA PLACEMENT Left 11/11/2020   Procedure: LEFT ARM First Stage Basilic Vein Fistula Creation;  Surgeon: Sheree Penne Lonni, MD;  Location[facility] OR;  Service: Vascular;   Laterality: Left;   BASCILIC VEIN TRANSPOSITION Left 01/23/2021   Procedure: Second Stage Basilic Vein Transposition of Left Arm Arteriovenous  Fistula;  Surgeon: Sheree Penne Lonni, MD;  Location: [facility] OR;  Service: Vascular;  Laterality: Left;  Block  to LMA    COLONOSCOPY     FISTULA SUPERFICIALIZATION Left 05/07/2022   Procedure: PLICATION OF ANEURYSM, LEFT ARM FISTULA;  Surgeon: Eliza Lonni RAMAN, MD;  Location: [facility] OR;  Service: Vascular;  Laterality: Left;   IR CT HEAD LTD  10/07/2023   IR CT HEAD LTD  10/07/2023   IR PATIENT EVAL TECH 0-60 MINS  10/07/2023   IR PERCUTANEOUS ART THROMBECTOMY/INFUSION INTRACRANIAL INC DIAG ANGIO  10/07/2023   NO PAST SURGERIES     PERIPHERAL VASCULAR BALLOON ANGIOPLASTY Left 05/03/2022   Procedure: PERIPHERAL VASCULAR BALLOON ANGIOPLASTY;  Surgeon: Eliza Lonni RAMAN, MD;  Location: [facility] INVASIVE CV LAB;  Service: Cardiovascular;  Laterality: Left;  AVF   RADIOLOGY WITH ANESTHESIA N/A 10/07/2023   Procedure: RADIOLOGY WITH ANESTHESIA;  Surgeon: Radiologist, Medication, MD;  Location: [facility] OR;  Service: Radiology;  Laterality: N/A;    Assessment & Plan Clinical Impression:  . Nathan Campbell is a 48 year old male with a significant past medical history, including systolic and diastolic congestive heart failure, anemia, HTN, obesity, and end-stage renal disease on hemodialysis TTHS. Presented to [facility] on 10/07/2023 after experiencing an acute onset of left-sided weakness, left side neglect, dysarthria and right gaze preference.  The  symptoms began earlier in the morning, and the patient presented with emergency medical services as a code stroke. Notably, the patient had been compliant with his hemodialysis treatment prior to this period. In the emergency department vitals remained stable. He was intubated for airway protection and because of agitation and restlessness. A chest X-ray showed no significant disease. Intravenous Cleviprex  was  initiated. Initial CT head revealed right MCA territory ischemic changes with calcific density in proximal right M1 concerning for calcific embolus.  The patient was administered TNK, and IR was consulted and Dr. Dolphus performed a cerebral angiogram which revealed a right M1 occlusion. Subsequently, the patient underwent a thrombectomy of the right M1, achieving TICI 2B revascularization of the occluded right middle cerebral artery M1 segment, including the superior and inferior divisions to the right M2 region. An MRI demonstrated a large right MCA territory CVA with associated diffuse cortical thickening and some mass effect but no significant midline shift. MRA revealed distal right M2 occlusion but no other new occlusions. He was  extubated on 10/11/2023. On 7/30, the patient vomited and aspirated, leading to the placement of a nasogastric tube. An abdominal X-ray indicated ileus.  Echocardiogram showed an ejection fraction of 50-55%. Prior to admission, the patient was not on any antithrombotics but was now placed on Aspirin  81mg  and Plavix  75mg , with DAPT recommended for three months followed by Aspirin  alone. Speech therapy was consulted, and a modified barium swallow study was conducted. The patient was advised to follow a dysphagia level 1 pureed diet with nectar-thick liquids. Patient was independent and working prior to admission. He lives in a one level apartment. Currently requiring max-mod assistance for ADL's and mod assist +2 for transfers and mobility. Therapy evaluations completed due to patient decreased functional mobility was admitted for a comprehensive rehab program.    Patient transferred to CIR on 10/18/2023 .    Patient currently requires max with basic self-care skills secondary to abnormal tone and decreased coordination, decreased visual perceptual skills, decreased visual motor skills, and hemianopsia, decreased attention to left, decreased memory, and decreased sitting balance,  decreased standing balance, decreased postural control, hemiplegia, and decreased balance strategies.  Prior to hospitalization, patient was fully independent.  Patient will benefit from skilled intervention to increase independence with basic self-care skills prior to discharge home with care partner.  Anticipate patient will require intermittent supervision and follow up outpatient.  OT - End of Session Activity Tolerance: Tolerates 10 - 20 min activity with multiple rests Endurance Deficit: Yes OT Assessment Rehab Potential (ACUTE ONLY): Good OT Barriers to Discharge: Inaccessible home environment OT Barriers to Discharge Comments: full flight of stairs to enter apartment OT Patient demonstrates impairments in the following area(s): Balance;Cognition;Endurance;Motor;Safety;Perception;Vision OT Basic ADL's Functional Problem(s): Grooming;Bathing;Dressing;Toileting OT Advanced ADL's Functional Problem(s): None OT Transfers Functional Problem(s): Toilet;Tub/Shower OT Additional Impairment(s): Fuctional Use of Upper Extremity OT Plan OT Intensity: Minimum of 1-2 x/day, 45 to 90 minutes OT Frequency: 5 out of 7 days OT Duration/Estimated Length of Stay: 20-21 days OT Treatment/Interventions: Balance/vestibular training;Cognitive remediation/compensation;DME/adaptive equipment instruction;Discharge planning;Functional mobility training;Pain management;Patient/family education;Neuromuscular re-education;Psychosocial support;Self Care/advanced ADL retraining;Therapeutic Exercise;Therapeutic Activities;UE/LE Strength taining/ROM;Splinting/orthotics;Visual/perceptual remediation/compensation;UE/LE Coordination activities OT Self Feeding Anticipated Outcome(s): independent OT Basic Self-Care Anticipated Outcome(s): supervision OT Toileting Anticipated Outcome(s): supervision OT Bathroom Transfers Anticipated Outcome(s): supervision for toilet, CGA for shower OT Recommendation Recommendations for  Other Services: None Patient destination: Home Follow Up Recommendations: Outpatient OT Equipment Recommended: To be determined   OT Evaluation Precautions/Restrictions  Precautions Precautions: Fall Precaution/Restrictions Comments:  L hemipareisis UE>LE, L hemianopsia, cortrak, watch SpO2 and RR Restrictions Weight Bearing Restrictions Per Provider Order: No Pain Pain Assessment Pain Scale: 0-10 Pain Score: 0-No pain Home Living/Prior Functioning Home Living Available Help at Discharge: Family Type of Home: Apartment Home Access: Stairs to enter Secretary/administrator of Steps: 1 flight Entrance Stairs-Rails: Right, Left Home Layout: One level Bathroom Shower/Tub: Engineer, manufacturing systems: Standard  Lives With: Family (sister and her two teen children) Prior Function Level of Independence: Independent with basic ADLs, Independent with homemaking with ambulation, Independent with gait, Independent with transfers  Able to Take Stairs?: Yes Driving: Yes 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, 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.) Vision Baseline Vision/History: 0 No visual deficits Ability to See in Adequate Light: 1 Impaired Patient Visual Report: No change from baseline (pt reports no change but unable to see to L side.) Vision Assessment?: Yes Eye Alignment: Within Functional Limits Alignment/Gaze Preference: Within Defined Limits Tracking/Visual Pursuits: Decreased smoothness of horizontal tracking;Decreased smoothness of eye movement to LEFT superior field;Decreased smoothness of eye movement to LEFT inferior field;Requires cues, head turns, or add eye shifts to track;Impaired - to be further tested in functional context Saccades: Additional head turns occurred during testing;Decreased speed of saccadic movement;Other (comment);Undershoots (in moving eyes toward L side, stops in midline and requries vc to continue to L side) Convergence: Other (comment) (with movement  less than 5in from face, eyes close and he falls asleep.) Visual Fields: Left homonymous hemianopsia Depth Perception: Undershoots Perception  Perception: Impaired Praxis Praxis: WFL Cognition Cognition Overall Cognitive Status: Impaired/Different from baseline Arousal/Alertness: Awake/alert Orientation Level: Person;Place;Situation Person: Oriented Place: Oriented Situation: Oriented Memory: Impaired Sustained Attention: Impaired Awareness: Impaired Problem Solving: Impaired Behaviors: Other (comment) (lethargic) Safety/Judgment: Impaired Brief Interview for Mental Status (BIMS) Repetition of Three Words (First Attempt): 3 Temporal Orientation: Year: Correct Temporal Orientation: Month: Accurate within 5 days Temporal Orientation: Day: Correct Recall: Sock: Yes, no cue required Recall: Blue: Yes, no cue required Recall: Bed: Yes, no cue required BIMS Summary Score: 15 Sensation Sensation Light Touch: Impaired by gross assessment Hot/Cold: Impaired by gross assessment Proprioception: Impaired by gross assessment Stereognosis: Impaired by gross assessment Coordination Gross Motor Movements are Fluid and Coordinated: No Fine Motor Movements are Fluid and Coordinated: No Coordination and Movement Description: L hemiplegia Finger Nose Finger Test: unable to perform with L hand Motor  Motor Motor: Hemiplegia  Trunk/Postural Assessment  Postural Control Postural Control: Deficits on evaluation Trunk Control: CGA  Balance Static Sitting Balance Static Sitting - Level of Assistance: 5: Stand by assistance Dynamic Sitting Balance Dynamic Sitting - Level of Assistance: 4: Min assist Static Standing Balance Static Standing - Level of Assistance: 4: Min assist Dynamic Standing Balance Dynamic Standing - Level of Assistance: 3: Mod assist Extremity/Trunk Assessment RUE Assessment RUE Assessment: Within Functional Limits LUE Assessment LUE Assessment: Exceptions  to [facility] Active Range of Motion (AROM) Comments: active shoulder extension with partial scapular elevation General Strength Comments: non functional;1 finger width subluxation, low tone LUE Body System: Neuro Brunstrum levels for arm and hand: Arm;Hand Brunstrum level for arm: Stage I Presynergy Brunstrum level for hand: Stage I Flaccidity 1 finger width subluxation, low tone  Care Tool Care Tool Self Care Eating   Eating Assist Level: Supervision/Verbal cueing    Oral Care    Oral Care Assist Level: Set up assist    Bathing   Body parts bathed by patient: Chest;Abdomen;Front perineal area;Right upper leg;Left upper leg;Face Body parts bathed by helper:  Right arm;Left arm;Buttocks;Right lower leg;Left lower leg   Assist Level: Maximal Assistance - Patient 24 - 49%    Upper Body Dressing(including orthotics)   What is the patient wearing?: Pull over shirt   Assist Level: Maximal Assistance - Patient 25 - 49%    Lower Body Dressing (excluding footwear)   What is the patient wearing?: Pants;Incontinence brief Assist for lower body dressing: Maximal Assistance - Patient 25 - 49%    Putting on/Taking off footwear   What is the patient wearing?: Non-skid slipper socks Assist for footwear: Dependent - Patient 0%       Care Tool Toileting Toileting activity   Assist for toileting: Maximal Assistance - Patient 25 - 49%     Care Tool Bed Mobility Roll left and right activity        Sit to lying activity        Lying to sitting on side of bed activity         Care Tool Transfers Sit to stand transfer   Sit to stand assist level: Minimal Assistance - Patient > 75%    Chair/bed transfer   Chair/bed transfer assist level: Minimal Assistance - Patient > 75%     Toilet transfer   Assist Level: Minimal Assistance - Patient > 75%     Care Tool Cognition  Expression of Ideas and Wants Expression of Ideas and Wants: 2. Frequent difficulty - frequently exhibits difficulty  with expressing needs and ideas  Understanding Verbal and Non-Verbal Content Understanding Verbal and Non-Verbal Content: 3. Usually understands - understands most conversations, but misses some part/intent of message. Requires cues at times to understand   Memory/Recall Ability Memory/Recall Ability : Current season;Location of own room;Staff names and faces   Refer to Care Plan for Long Term Goals  SHORT TERM GOAL WEEK 1 OT Short Term Goal 1 (Week 1): Pt will be able to don shirt with min A. OT Short Term Goal 2 (Week 1): Pt will be able to don pants over feet with min A. OT Short Term Goal 3 (Week 1): pt will be able to perform self ROM of LUE. OT Short Term Goal 4 (Week 1): Pt will be able to bathe with min A using long handled sponge.  Recommendations for other services: None    Skilled Therapeutic Intervention ADL ADL Eating: Supervision/safety Grooming: Supervision/safety Upper Body Bathing: Moderate assistance Lower Body Bathing: Maximal assistance Upper Body Dressing: Maximal assistance Lower Body Dressing: Maximal assistance Toileting: Maximal assistance Where Assessed-Toileting: Teacher, adult education: Curator Method: Proofreader: Grab bars Mobility  Transfers Sit to Stand: Contact Guard/Touching assist Stand to Sit: Contact Guard/Touching assist   Discharge Criteria: Patient will be discharged from OT if patient refuses treatment 3 consecutive times without medical reason, if treatment goals not met, if there is a change in medical status, if patient makes no progress towards goals or if patient is discharged from hospital.  The above assessment, treatment plan, treatment alternatives and goals were discussed and mutually agreed upon: by patient  Mazzie Brodrick 10/19/2023, 1:10 PM

## 2023-10-19 NOTE — Plan of Care (Signed)
  Problem: Consults Goal: RH STROKE PATIENT EDUCATION Description: See Patient Education module for education specifics  Outcome: Progressing   Problem: RH BOWEL ELIMINATION Goal: RH STG MANAGE BOWEL WITH ASSISTANCE Description: STG Manage Bowel with mod I Assistance. Outcome: Progressing Goal: RH STG MANAGE BOWEL W/MEDICATION W/ASSISTANCE Description: STG Manage Bowel with Medication with mod I Assistance. Outcome: Progressing   Problem: RH SAFETY Goal: RH STG ADHERE TO SAFETY PRECAUTIONS W/ASSISTANCE/DEVICE Description: STG Adhere to Safety Precautions With cues Assistance/Device. Outcome: Progressing   Problem: RH PAIN MANAGEMENT Goal: RH STG PAIN MANAGED AT OR BELOW PT'S PAIN GOAL Description: Pain < 4 with prns Outcome: Progressing

## 2023-10-19 NOTE — Progress Notes (Incomplete Revision)
 Inpatient Rehabilitation Discharge Medication Review by a Pharmacist  A complete drug regimen review was completed for this patient to identify any potential clinically significant medication issues.  High Risk Drug Classes Is patient taking? Indication by Medication  Antipsychotic No   Anticoagulant No   Antibiotic No   Opioid No   Antiplatelet Yes Clopidogrel , Aspirin  x 3 months then aspirin  alone - CVA  Hypoglycemics/insulin  No   Vasoactive Medication Yes Carvedilol - HTN  Chemotherapy No   Other Yes Atorvastatin  - HLD Pantoprazole  - Reflux  Lanthanum  - ESRD Cyclobenzaprine  prn spasms Midodrine  - hypotension     Type of Medication Issue Identified Description of Issue Recommendation(s)  Drug Interaction(s) (clinically significant)     Duplicate Therapy     Allergy     No Medication Administration End Date     Incorrect Dose     Additional Drug Therapy Needed     Significant med changes from prior encounter (inform family/care partners about these prior to discharge).    Other       Clinically significant medication issues were identified that warrant physician communication and completion of prescribed/recommended actions by midnight of the next day:  No  Name of provider notified for urgent issues identified:   Provider Method of Notification:     Pharmacist comments: None  Time spent performing this drug regimen review (minutes):  20 minutes    Sergio Batch, PharmD, [place], AAHIVP, CPP Infectious Disease Pharmacist 11/03/2023 12:12 PM

## 2023-10-19 NOTE — Progress Notes (Signed)
 [facility] Progress Note   Subjective: Patient seen in his room today. Had HD 8/5 and we were able to get 1.8L off during treatment. He has transferred over to CIR today. He denied any dyspnea or CP today.  Next HD 10/20/23  Objective Vitals:   10/18/23 2146 10/19/23 0450 10/19/23 0840 10/19/23 1007  BP: 131/77 (!) 148/78 124/66   Pulse: 94 93 78   Resp:  (!) 22 19   Temp:  98 F (36.7 C) 97.9 F (36.6 C)   TempSrc:   Oral   SpO2:  90% 93%   Height:    6' (1.829 m)   Physical Exam General: Sitting in chair, no distress, obese, cortrak in place Heart: Normal rate, no rub Lungs: Bilateral chest rise with no increased work of breathing Abdomen: soft, distended Extremities: Trace edema in lower extremities, warm and well-perfused Dialysis Access: AVF +t/b  Additional Objective Labs: Basic Metabolic Panel: Recent Labs  Lab 10/13/23 0552 10/14/23 0618 10/15/23 0321 10/17/23 0117 10/18/23 1000  NA 142   < > 140 136 135  K 4.3   < > 4.5 4.3 5.8*  CL 95*   < > 92* 92* 89*  CO2 21*   < > 24 24 23   GLUCOSE 116*   < > 123* 121* 103*  BUN 87*   < > 105* 103* 139*  CREATININE 15.18*   < > 16.04* 14.89* 18.98*  CALCIUM  10.5*   < > 10.3 10.0 9.8  PHOS 11.4*  --   --   --  >30.0*   < > = values in this interval not displayed.     CBC: Recent Labs  Lab 10/13/23 0552 10/14/23 0618 10/15/23 0321 10/17/23 0117 10/18/23 1000  WBC 15.8* 16.2* 16.0* 12.3* 11.2*  HGB 10.9* 11.9* 10.7* 11.6* 10.9*  HCT 33.8* 38.2* 33.9* 36.0* 33.9*  MCV 99.4 100.5* 101.2* 99.7 98.5  PLT 301 350 358 363 384     Medications:  anticoagulant sodium citrate       amLODipine   10 mg Oral Daily   aspirin   81 mg Oral Daily   atorvastatin   40 mg Oral Daily   carvedilol   9.375 mg Oral BID   Chlorhexidine  Gluconate Cloth  6 each Topical Q0600   clopidogrel   75 mg Oral Daily   docusate  100 mg Oral BID   feeding supplement (OSMOLITE 1.5 CAL)  1,000 mL Per Tube Q24H   free water   30  mL Per Tube Q6H   heparin  injection (subcutaneous)  5,000 Units Subcutaneous Q8H   insulin  aspart  0-6 Units Subcutaneous Q4H   isosorbide  dinitrate  5 mg Oral BID   lanthanum   1,000 mg Oral TID WC   mouth rinse  15 mL Mouth Rinse 4 times per day   pantoprazole  (PROTONIX ) IV  40 mg Intravenous QHS   sodium zirconium cyclosilicate   10 g Oral BID    Dialysis Orders TTS - AF 4:15hr, 500/600, EDW 138kg, 2K/2C bath, AVF, heparin  4000+ - Mircera 50mcg given 10/06/23 - Hectoral 7mcg IV q HD - Takes Auryxia  3/meals and Xphozah 30mg  BID  Assessment/Plan: Acute R MCA CVA: S/p TNK 7/25. Required intubation, now on [place] O2. Ongoing L sided weakness. Per neuro. Working with PT currently. Now in CIR. AHRF, ?aspiration + CVA: Now extubated, on [place] O2. S/p Unasyn  course. ESRD: Continue HD on TTS schedule  Hyperkalemia: K 5.8. We have ordered 3 doses of Lokelma . Will have them recheck post HD.  HTN/volume:  BP decent, occ high - UF as tolerated, keep lowering EDW. Anemia of ESRD: Hgb ~10.9 today, no ESA for now. Secondary HPTH: Corrected Ca 10.4 and Phos very high, VDRA on hold. Home auryxia  can not be given via NG tube - change to fosrenol  for now. Nutrition: Alb low, getting TF + protein supplements. Per RD - unable to do Nepro at this point, will change in future.  Belvie Och, NP 10/19/2023, 10:58 AM  [facility]

## 2023-10-19 NOTE — Progress Notes (Signed)
 PROGRESS NOTE   Subjective/Complaints:  No issues overnite , pt somnolent drifting off this am , pt states he slept  Labs reviewed elevated K+  ROS-limited by mental status   Objective:   DG Chest 2 View Result Date: 10/18/2023 CLINICAL DATA:  Ischemic stroke. EXAM: CHEST - 2 VIEW COMPARISON:  Chest radiograph dated 10/17/2023. FINDINGS: Evaluation is limited due to body habitus. Enteric tube extends below the diaphragm with tip beyond the inferior margin of the image. There is cardiomegaly with vascular congestion. Evaluation of the previously described left lung base density is limited on the lateral view due to body habitus. No large pleural effusion or pneumothorax. No acute osseous pathology. IMPRESSION: 1. Cardiomegaly with vascular congestion. 2. Evaluation of the previously described left lung base density is limited on the lateral view due to body habitus. Electronically Signed   By: Vanetta Chou M.D.   On: 10/18/2023 19:35   DG CHEST PORT 1 VIEW Result Date: 10/17/2023 CLINICAL DATA:  Shortness of breath. EXAM: PORTABLE CHEST 1 VIEW COMPARISON:  10/12/2023. FINDINGS: Trachea is midline. Heart is enlarged, stable. Thoracic aorta is calcified. Feeding tube is followed into the stomach with the tip projecting beyond the inferior margin of the image. Contrast is seen in the stomach in association. Lungs are somewhat low in volume. Vague airspace consolidation in the left lower lobe and lingula. IMPRESSION: Vague lingular and left lower lobe airspace opacification may be due to pneumonia. Followup PA and lateral chest X-ray is recommended in 3-4 weeks following trial of antibiotic therapy to ensure resolution and exclude underlying malignancy. Electronically Signed   By: Newell Eke M.D.   On: 10/17/2023 18:15   DG Swallowing Func-Speech Pathology Result Date: 10/17/2023 Table formatting from the original result was not included.  Modified Barium Swallow Study Patient Details Name: Mats Jeanlouis MRN: [id number] Date of Birth: 09-09-1975 Today's Date: 10/17/2023 HPI/PMH: HPI: Pt is a 48 y/o male who presented 10/07/23 with left-sided weakness and slurred speech. MRI brain 7/26: Large right MCA territory infarct with associated diffuse cortical thickening and some mass effect. Pt s/p TNK & IR revascularization. Intubated 7/25-7/29. 7/29 ileus. PMH: CHF, CM, ESRD on HD TTS, HTN, obesity, HLD Clinical Impression: Clinical Impression: Pt demonstrates a moderate oral dysphagia as well as late initiation of swallowing. Pt has severe left lingual weakness, tongue deviated left, pooled oral secretions spilling on the left and causing coughing prior to starting exam. Despite this pt able to achieve posterior transit of each texture with repetitive slow posterior lingual propulsion and minimal anterior spillage of PO. Able to use a straw. Pt does not form a bolus and only mashes solids with tongue to palate, cannot manipulate for mastication. All boluses pool for several seconds in the pyriform sinsues prior to hyoid burst. Pt had only one instance of aspiration of thin liquids as swallow initiated. No sensation. Recommend initiating a puree diet and nectar thick liquids. Suspect pts secretion management may improve with oral intake and may be easily advance to thin liquids or improved solids. Pt will need pills crushed in puree as he could not orally transit barium tablet. Factors that may increase risk of adverse event in  presence of aspiration Noe & Lianne 2021): No data recorded Recommendations/Plan: Swallowing Evaluation Recommendations Swallowing Evaluation Recommendations Recommendations: PO diet PO Diet Recommendation: Dysphagia 1 (Pureed); Mildly thick liquids (Level 2, nectar thick) Liquid Administration via: Cup; Straw Medication Administration: Crushed with puree Supervision: Staff to assist with self-feeding Oral care recommendations: Use  suctioning for oral care Caregiver Recommendations: Have oral suction available Treatment Plan Treatment Plan Treatment recommendations: Therapy as outlined in treatment plan below Follow-up recommendations: Acute inpatient rehab (3 hours/day) Treatment frequency: Min 2x/week Treatment duration: 2 weeks Interventions: Patient/family education; Trials of upgraded texture/liquids; Diet toleration management by SLP Recommendations Recommendations for follow up therapy are one component of a multi-disciplinary discharge planning process, led by the attending physician.  Recommendations may be updated based on patient status, additional functional criteria and insurance authorization. Assessment: Orofacial Exam: Orofacial Exam Oral Cavity: Oral Hygiene: Pooled secretions Oral Cavity - Dentition: Adequate natural dentition Orofacial Anatomy: WFL Oral Motor/Sensory Function: Suspected cranial nerve impairment CN V - Trigeminal: Not tested CN VII - Facial: Left motor impairment CN IX - Glossopharyngeal, CN X - Vagus: Not tested CN XII - Hypoglossal: Left motor impairment Anatomy: Anatomy: WFL Boluses Administered: Boluses Administered Boluses Administered: Thin liquids (Level 0); Mildly thick liquids (Level 2, nectar thick); Puree; Solid  Oral Impairment Domain: Oral Impairment Domain Lip Closure: Interlabial escape, no progression to anterior lip Tongue control during bolus hold: Posterior escape of greater than half of bolus Bolus preparation/mastication: Minimal chewing/mashing with majority of bolus unchewed Bolus transport/lingual motion: Slow tongue motion Oral residue: Trace residue lining oral structures Location of oral residue : Lateral sulci Initiation of pharyngeal swallow : Pyriform sinuses  Pharyngeal Impairment Domain: Pharyngeal Impairment Domain Soft palate elevation: No bolus between soft palate (SP)/pharyngeal wall (PW) Laryngeal elevation: Complete superior movement of thyroid cartilage with complete  approximation of arytenoids to epiglottic petiole Anterior hyoid excursion: Complete anterior movement Epiglottic movement: Complete inversion Laryngeal vestibule closure: Complete, no air/contrast in laryngeal vestibule Pharyngeal stripping wave : Present - complete Pharyngeal contraction (A/P view only): Complete Pharyngoesophageal segment opening: Complete distension and complete duration, no obstruction of flow Tongue base retraction: No contrast between tongue base and posterior pharyngeal wall (PPW) Pharyngeal residue: Complete pharyngeal clearance  Esophageal Impairment Domain: No data recorded Pill: No data recorded Penetration/Aspiration Scale Score: Penetration/Aspiration Scale Score 1.  Material does not enter airway: Mildly thick liquids (Level 2, nectar thick); Puree; Solid 8.  Material enters airway, passes BELOW cords without attempt by patient to eject out (silent aspiration) : Thin liquids (Level 0) Compensatory Strategies: No data recorded  General Information: Caregiver present: No  Diet Prior to this Study: NPO; Cortrak/Small bore NG tube   No data recorded  No data recorded  Supplemental O2: None (Room air)   History of Recent Intubation: Yes  Behavior/Cognition: Alert; Cooperative Self-Feeding Abilities: Dependent for feeding Baseline vocal quality/speech: Abnormal resonance Volitional Cough: Able to elicit Volitional Swallow: Able to elicit No data recorded Goal Planning: Prognosis for improved oropharyngeal function: Good No data recorded No data recorded Patient/Family Stated Goal: none stated No data recorded Pain: Pain Assessment Pain Assessment: No/denies pain Pain Intervention(s): Monitored during session End of Session: Start Time:SLP Start Time (ACUTE ONLY): 1150 Stop Time: SLP Stop Time (ACUTE ONLY): 1212 Time Calculation:SLP Time Calculation (min) (ACUTE ONLY): 22 min Charges: SLP Evaluations $ SLP Speech Visit: 1 Visit SLP Evaluations $BSS Swallow: 1 Procedure $MBS Swallow: 1  Procedure $Speech Treatment for Individual: 1 Procedure SLP visit diagnosis: SLP Visit Diagnosis:  Dysphagia, oropharyngeal phase (R13.12) Past Medical History: Past Medical History: Diagnosis Date  Acute combined systolic and diastolic CHF, NYHA class 4 (HCC) 06/10/2016  Anemia   Cardiomyopathy (HCC) 06/14/2016  CHF (congestive heart failure) (HCC)   COVID 10/2020  Dyspnea   ESRD on hemodialysis (HCC)   TTS at [facility]  Hypertension   Hypertensive heart and kidney disease with heart failure (HCC) 06/14/2016  Hypertensive heart disease with congestive heart failure (HCC)   Obesity  Past Surgical History: Past Surgical History: Procedure Laterality Date  A/V FISTULAGRAM Left 05/03/2022  Procedure: A/V Fistulagram;  Surgeon: Eliza Lonni RAMAN, MD;  Location: [facility] INVASIVE CV LAB;  Service: Cardiovascular;  Laterality: Left;  AV FISTULA PLACEMENT Left 11/11/2020  Procedure: LEFT ARM First Stage Basilic Vein Fistula Creation;  Surgeon: Sheree Penne Lonni, MD;  Location: [facility] OR;  Service: Vascular;  Laterality: Left;  BASCILIC VEIN TRANSPOSITION Left 01/23/2021  Procedure: Second Stage Basilic Vein Transposition of Left Arm Arteriovenous  Fistula;  Surgeon: Sheree Penne Lonni, MD;  Location: [facility] OR;  Service: Vascular;  Laterality: Left;  Block  to LMA   COLONOSCOPY    FISTULA SUPERFICIALIZATION Left 05/07/2022  Procedure: PLICATION OF ANEURYSM, LEFT ARM FISTULA;  Surgeon: Eliza Lonni RAMAN, MD;  Location: [facility] OR;  Service: Vascular;  Laterality: Left;  IR CT HEAD LTD  10/07/2023  IR CT HEAD LTD  10/07/2023  IR PATIENT EVAL TECH 0-60 MINS  10/07/2023  IR PERCUTANEOUS ART THROMBECTOMY/INFUSION INTRACRANIAL INC DIAG ANGIO  10/07/2023  NO PAST SURGERIES    PERIPHERAL VASCULAR BALLOON ANGIOPLASTY Left 05/03/2022  Procedure: PERIPHERAL VASCULAR BALLOON ANGIOPLASTY;  Surgeon: Eliza Lonni RAMAN, MD;  Location: [facility] INVASIVE CV LAB;  Service: Cardiovascular;  Laterality: Left;  AVF  RADIOLOGY WITH ANESTHESIA N/A  10/07/2023  Procedure: RADIOLOGY WITH ANESTHESIA;  Surgeon: Radiologist, Medication, MD;  Location: [facility] OR;  Service: Radiology;  Laterality: N/A; Consuelo Fort, MA CCC-SLP Acute Rehabilitation Services Secure Chat Preferred Office [phone] [place] 10/17/2023, 1:02 PM  Recent Labs    10/17/23 0117 10/18/23 1000  WBC 12.3* 11.2*  HGB 11.6* 10.9*  HCT 36.0* 33.9*  PLT 363 384   Recent Labs    10/17/23 0117 10/18/23 1000  NA 136 135  K 4.3 5.8*  CL 92* 89*  CO2 24 23  GLUCOSE 121* 103*  BUN 103* 139*  CREATININE 14.89* 18.98*  CALCIUM  10.0 9.8    Intake/Output Summary (Last 24 hours) at 10/19/2023 0823 Last data filed at 10/19/2023 0612 Gross per 24 hour  Intake 90 ml  Output --  Net 90 ml        Physical Exam: Vital Signs Blood pressure (!) 148/78, pulse 93, temperature 98 F (36.7 C), resp. rate (!) 22, SpO2 90%.   General: No acute distress Mood and affect are appropriate Heart: Regular rate and rhythm no rubs murmurs or extra sounds Lungs: Clear to auscultation, breathing unlabored, no rales or wheezes Abdomen: Positive bowel sounds, soft nontender to palpation, nondistended Extremities: No clubbing, cyanosis, or edema Skin: No evidence of breakdown, no evidence of rash Neurologic: Cranial nerves II through XII intact, motor strength is 5/5 in right deltoid, bicep, tricep, grip, hip flexor, knee extensors, ankle dorsiflexor and plantar flexor O/5 in LUE, 4- LLE muscle groups as above  Sensory exam normal sensation to light touch and proprioception in bilateral upper and lower extremities Cerebellar exam not tested on LUE due to weakness Musculoskeletal: pain free  range of motion in all 4 extremities. No joint  swelling   Assessment/Plan: 1. Functional deficits which require 3+ hours per day of interdisciplinary therapy in a comprehensive inpatient rehab setting. Physiatrist is providing close team supervision and 24 hour management of active  medical problems listed below. Physiatrist and rehab team continue to assess barriers to discharge/monitor patient progress toward functional and medical goals  Care Tool:  Bathing              Bathing assist       Upper Body Dressing/Undressing Upper body dressing        Upper body assist      Lower Body Dressing/Undressing Lower body dressing            Lower body assist       Toileting Toileting    Toileting assist       Transfers Chair/bed transfer  Transfers assist           Locomotion Ambulation   Ambulation assist              Walk 10 feet activity   Assist           Walk 50 feet activity   Assist           Walk 150 feet activity   Assist           Walk 10 feet on uneven surface  activity   Assist           Wheelchair     Assist               Wheelchair 50 feet with 2 turns activity    Assist            Wheelchair 150 feet activity     Assist          Blood pressure (!) 148/78, pulse 93, temperature 98 F (36.7 C), resp. rate (!) 22, SpO2 90%.  Medical Problem List and Plan: 1. Functional deficits secondary to acute R MCA CVA with right M1 occlusion s/p TNK 7/25 and IR with TICI2b likely large vessel disease.             -patient may shower             -ELOS/Goals: 10-14 days, supervision             - Admit to CIR 2.  Antithrombotics: -DVT/anticoagulation:  Pharmaceutical: Heparin              -antiplatelet therapy: Aspirin  and Plavix  x 3 months then Aspirin  alone.  3. Pain Management: Tylenol  and Flexeril  (back pain) as needed 4. Mood/Behavior/Sleep: Therapy evaluations completed due to patient decreased functional mobility was admitted for a comprehensive rehab program.              -antipsychotic agents: N/A 5. Neuropsych/cognition: This patient is not capable of making decisions on his own behalf. 6. Skin/Wound Care: Routine pressure relief measures 7.  Fluids/Electrolytes/Nutrition: Monitor I/O and Daily weights.              - TF + protein supplements.  Dysphagia - upgraded to dysphagia 1 diet -TF decreased 35 cc an hour. consulted RD to advise on PO status- if progressing well maybe d/c free water  intake and TF at pm.                   -calorie count ordered              -continue aspiration precautions 8. ESRD on hemodialysis: BUN  139, Cr 18.98 HD on TTHS schedule- --Labs to be collected in HD             -Hyperphos/Hyperkalemia Phos > 30, K 5.8- asking Nephro whether pt needs lokelma  today or if labs were drawn pre HD 9. Chronic Anemia of ESRD: hgb 10.9 from 11.6 continue to monitor  10. Acute hypoxic/hypercapnic respiratory failure: On 2 L [place] CXR Vague lingular and left lower lobe airspace opacification   Repeat CXR done 8/5 pending.  Suspect underlying OSA will check overnite oximetry  11. Leukocytosis: WBC 16.0-12.3-11.2, afebrile see above  12. Combined systolic and diastolic HF/HTN: home meds amlodipine , isosorbide  and carvedilol .              - Monitor for orthostasis with activity.  13. Secondary HPTH: Home auryxia  cannot be given via NG tube--continue Fosrenol   14. HLD: LDL 85 Atorvastatin  40 mg  15. Obesity: BMI 39.77 educate on diet and weight loss to promote overall health and mobility.  16.  Hypertension: Long-term goal normotensive.  Continue Norvasc , Coreg , isosorbide   Vitals:   10/18/23 2146 10/19/23 0450  BP: 131/77 (!) 148/78  Pulse: 94 93  Resp:  (!) 22  Temp:  98 F (36.7 C)  SpO2:  90%    17.  Hyperlipidemia: Continue statin      LOS: 1 days A FACE TO FACE EVALUATION WAS PERFORMED  Prentice FORBES Compton 10/19/2023, 8:23 AM

## 2023-10-19 NOTE — Progress Notes (Signed)
 Initial Nutrition Assessment  DOCUMENTATION CODES:  Obesity unspecified  INTERVENTION:  Continue current diet as recommended by SLP, upgrade textures as able Added fluid restriction per provider request Adjust TF to nocturnal via cortrak Osmolite 1.5 at 65 ml/h x 12 hours (780 ml per day) Free water : 30mL every 6 hours to maintain tube patency  Provides 1170 kcal, 49 gm protein, 594 ml free water  daily (TF+flush = free water  daily) 48-hour kcal count  NUTRITION DIAGNOSIS:  Increased nutrient needs related to acute illness as evidenced by estimated needs.  GOAL:  Patient will meet greater than or equal to 90% of their needs  MONITOR:  PO intake, Labs, Weight trends  REASON FOR ASSESSMENT:  Consult Calorie Count  ASSESSMENT:  Pt with hx of ESRD on HD, HTN, and CHF transferred to CIR after an acute hospitalization at [place] for R MCA infarct.Pt with hx of ESRD on HD, HTN, and CHF transferred to CIR after an acute hospitalization at [facility] for R MCA infarct.   7/28 - cortrak placed while inpatient 7/30 - pt pulled out cortrak, NGT placed 8/1 - NGT pulled out, cortrak replaced 8/4 - MBS, DYS1/nectar thick  8/5 - admitted to CIR  Pt resting in bedside chair at the time of assessment, RN in room getting medications together. Pt awake and alert for the most part, but did doze off during conversation. Pt able to have diet advanced and so far has had good intake. Provider requests that feeds be adjusted to nocturnal to determine if PO intake is adequate to have cortrak removed.   Started kcal count, placed envelope on door and discussed with RN. Pt also in agreement with plan. Did inquire if he could bring his playstation to his CIR room.   Admit weight: 144 kg (7/25 inpatient admission) Current weight: 128.1 kg    EDW: 138 kg   Nutritionally Relevant Medications: Scheduled Meds:  atorvastatin   40 mg Per Tube Daily   docusate  100 mg Per Tube BID   OSMOLITE  1.5 CAL  1,000 mL Per Tube Q24H   free water   30 mL Per Tube Q6H   insulin  aspart  0-6 Units Subcutaneous Q4H   pantoprazole  IV  40 mg Intravenous QHS   PRN Meds: bisacodyl   Labs Reviewed: CBG ranges from 102-140 mg/dL over the last 24 hours HgbA1c 5.1% (7/26)  NUTRITION - FOCUSED PHYSICAL EXAM: Flowsheet Row Most Recent Value  Orbital Region No depletion  Upper Arm Region No depletion  Thoracic and Lumbar Region No depletion  Buccal Region No depletion  Temple Region No depletion  Clavicle Bone Region No depletion  Clavicle and Acromion Bone Region No depletion  Scapular Bone Region No depletion  Dorsal Hand No depletion  Patellar Region No depletion  Anterior Thigh Region No depletion  Posterior Calf Region No depletion  Edema (RD Assessment) Mild  Hair Reviewed  Eyes Reviewed  Mouth Reviewed  Skin Reviewed  Nails Reviewed    Diet Order:   Diet Order             DIET - DYS 1 Room service appropriate? No; Fluid consistency: Nectar Thick; Fluid restriction: 1500 mL Fluid  Diet effective now                   EDUCATION NEEDS:  Education needs have been addressed  Skin:  Skin Assessment: Reviewed RN Assessment  Last BM:  8/2  Height:  Ht Readings from Last 1 Encounters:  10/19/23 6' (1.829 m)    Weight:  Wt Readings from Last 1 Encounters:  10/19/23 128 kg    Ideal Body Weight:  80.9 kg  BMI:  Body mass index is 38.27 kg/m.  Estimated Nutritional Needs:  Kcal:  2300-2500 kcal/d Protein:  115-130 g/d Fluid:  1L + UOP    Nathan Campbell, RD, LDN, CNSC Registered Dietitian II Please reach out via secure chat

## 2023-10-19 NOTE — Patient Care Conference (Signed)
 Inpatient RehabilitationTeam Conference and Plan of Care Update Date: 10/19/2023   Time: 10:30 AM    Patient Name: Nathan Campbell      Medical Record Number: [id number]  Date of Birth: February 22, 1976 Sex: Male         Room/Bed: 4M10C/4M10C-01 Payor Info: Payor: Multimedia programmer / Plan: [employer]HC MEDICARE / Product Type: *No Product type* /    Admit Date/Time:  10/18/2023  6:10 PM  Primary Diagnosis:  <principal problem not specified>  Hospital Problems: Active Problems:   * No active hospital problems. *    Expected Discharge Date: Expected Discharge Date:  (evals pending)  Team Members Present: Physician leading conference: Dr. Prentice Compton Social Worker Present: [name] [name], LCSW Nurse Present: Barnie Ronde, RN PT Present: Delinda Bertrand, PTA;Sherlean Perks, PT OT Present: Julia Saguier, OT SLP Present: Cassidi Sockwell, SLP     Current Status/Progress Goal Weekly Team Focus  Bowel/Bladder      Anuric;  ESRD/HD (T-H-Sa), constipation     Continent of bowel with mod I assist    Constipation addressed  Swallow/Nutrition/ Hydration               ADL's                Mobility   Eval Pending           Communication                Safety/Cognition/ Behavioral Observations               Pain                Skin      N/a           Discharge Planning:  TBA   Team Discussion: Patient admitted post right MCA CVA with cognitive deficits, dysarthria, left hemianopsia with left upper extremity weakness. Functional progress limited by fatigue; questionable OSA; HS O2 sat monitoring.   Patient on target to meet rehab goals: Currently, patient needs min - mod assist for sit - stand and step navigation.  Maintained on a dysphagia 1 Nectar thick liquid diet with HS tube feeds via cortrak.    *See Care Plan and progress notes for long and short-term goals.   Revisions to Treatment Plan:  N/a   Teaching Needs: Safety, medications,  nutritional means/dietary modification, transfers, toileting, etc.   Current Barriers to Discharge: Decreased caregiver support, Home enviroment access/layout, and Hemodialysis  Possible Resolutions to Barriers: Family education     Medical Summary Current Status: ESRD on HD, severe dysphagia, LUE weakness, LLE at least 4/5 ,  Barriers to Discharge: Medical stability;Morbid Obesity;Inadequate Nutritional Intake;Renal Insufficiency/Failure       Continued Need for Acute Rehabilitation Level of Care: The patient requires daily medical management by a physician with specialized training in physical medicine and rehabilitation for the following reasons: Direction of a multidisciplinary physical rehabilitation program to maximize functional independence : Yes Medical management of patient stability for increased activity during participation in an intensive rehabilitation regime.: Yes Analysis of laboratory values and/or radiology reports with any subsequent need for medication adjustment and/or medical intervention. : Yes   I attest that I was present, lead the team conference, and concur with the assessment and plan of the team.   Ronde Barnie B 10/19/2023, 4:07 PM

## 2023-10-19 NOTE — Evaluation (Signed)
 Speech Language Pathology Assessment and Plan  Patient Details  Name: Nathan Campbell MRN: [id number] Date of Birth: 01/06/1976  SLP Diagnosis: Dysarthria;Cognitive Impairments;Dysphagia  Rehab Potential: Good ELOS: 20-21 days    Today's Date: 10/19/2023 SLP Individual Time: 1240-1340 SLP Individual Time Calculation (min): 60 min   Hospital Problem: Active Problems:   * No active hospital problems. *  Past Medical History:  Past Medical History:  Diagnosis Date   Acute combined systolic and diastolic CHF, NYHA class 4 (HCC) 06/10/2016   Anemia    Cardiomyopathy (HCC) 06/14/2016   CHF (congestive heart failure) (HCC)    COVID 10/2020   Dyspnea    ESRD on hemodialysis (HCC)    TTS at [facility]   Hypertension    Hypertensive heart and kidney disease with heart failure (HCC) 06/14/2016   Hypertensive heart disease with congestive heart failure (HCC)    Obesity    Past Surgical History:  Past Surgical History:  Procedure Laterality Date   A/V FISTULAGRAM Left 05/03/2022   Procedure: A/V Fistulagram;  Surgeon: Eliza Lonni RAMAN, MD;  Location: [facility] INVASIVE CV LAB;  Service: Cardiovascular;  Laterality: Left;   AV FISTULA PLACEMENT Left 11/11/2020   Procedure: LEFT ARM First Stage Basilic Vein Fistula Creation;  Surgeon: Sheree Penne Lonni, MD;  Location: [facility] OR;  Service: Vascular;  Laterality: Left;   BASCILIC VEIN TRANSPOSITION Left 01/23/2021   Procedure: Second Stage Basilic Vein Transposition of Left Arm Arteriovenous  Fistula;  Surgeon: Sheree Penne Lonni, MD;  Location: [facility] OR;  Service: Vascular;  Laterality: Left;  Block  to LMA    COLONOSCOPY     FISTULA SUPERFICIALIZATION Left 05/07/2022   Procedure: PLICATION OF ANEURYSM, LEFT ARM FISTULA;  Surgeon: Eliza Lonni RAMAN, MD;  Location: [facility] OR;  Service: Vascular;  Laterality: Left;   IR CT HEAD LTD  10/07/2023   IR CT HEAD LTD  10/07/2023   IR PATIENT EVAL TECH 0-60 MINS  10/07/2023   IR PERCUTANEOUS  ART THROMBECTOMY/INFUSION INTRACRANIAL INC DIAG ANGIO  10/07/2023   NO PAST SURGERIES     PERIPHERAL VASCULAR BALLOON ANGIOPLASTY Left 05/03/2022   Procedure: PERIPHERAL VASCULAR BALLOON ANGIOPLASTY;  Surgeon: Eliza Lonni RAMAN, MD;  Location: [facility] INVASIVE CV LAB;  Service: Cardiovascular;  Laterality: Left;  AVF   RADIOLOGY WITH ANESTHESIA N/A 10/07/2023   Procedure: RADIOLOGY WITH ANESTHESIA;  Surgeon: Radiologist, Medication, MD;  Location: [facility] OR;  Service: Radiology;  Laterality: N/A;    Assessment / Plan / Recommendation Clinical Impression HPI: Pt is a 48 y/o male with PMH of systolic and diastolic CHF, anemia, HTN, obesity, and ESRD on HD TTS who admitted to [facility] with acute onset of left sided weakness and neglect. He is compliant with his dialysis and stated in his normal state of health at 5 AM with onset of symptoms at 630 AM. On arrival to ED pt with NIHSS 13, SBP as high as 239, pt restless/agitated, and he ultimately had to be intubated for airway protection. CT revealed R MCA ischemic changes with possible calcified embolus in the proximal right m1 branch. Pt did receive TNK without significant improvement. Pt was taken to IR for angio and thrombectomy with successful revascularization. Etiology of CVA felt to be large vessel disease given clacified plaque. MRI confirmed large right MCA infarct with diffuse cortical thickening and some mild mass effect. EF 50-55%, A1C 5.1. Recommendations for aspirin  and plavix  x3 months and then aspiring alone. He was extubated and is on room air.  Clinical Impression:  Bedside Swallow Evaluation: A bedside swallow evaluation was completed to assess for s/sx of oropharyngeal dysphagia. Oral mechanism exam revealed moderate L facial/oral asymmetry/weakness and L lingual deviation. POs administered included NTL and puree. Patient with L anterior spillage of saliva and NTL. Patient required mod cues to slow rate and reduce impulsivity during intake.  Patient with immediate coughing after puree textures when consuming large boluses at a quick rate. Recommend continuation of current diet per MBS results with use of standardized precautions including sitting upright during PO, taking small bites/sips at a slow rate and lingual sweep as needed to clear oral residuals. Recommend FULL supervision during mealtimes.  Cognitive-Linguistic: Patient was evaluated via portions of the Cognistat standardized assessment and informal measures. Patient lethargic during session, requiring occasional cues to maintain alertness. Patient with functional expressive and receptive language indicative by intact naming and command following. Patient scored with moderate-severe deficits in memory, attention (suspect a mild degree of L inattention) and problem solving. Informally observed deficits in intellectual awareness as patient felt he did ok on evaluation despite significant difficulty. Patient with moderate-severe dysarthia characterized by reduced respiratory support and oral/facial weakness. Patient is approximatley 60% intelligible at the word level and requires pauses during phrases to gain respiratory support. During sustained phonation task, patient with an average of 2 seconds. Recommend targeting dysarthria, dysphagia and cognition during inpatient rehabilitation stay.  Pt would benefit from skilled ST services to maximize cognition, dysphagia and dysarthria in order to maximize functional independence at d/c. Anticipate patient will require 24 hour supervision at d/c and f/u SLP services.    Skilled Therapeutic Interventions          Patient evaluated using a standardized cognitive linguistic assessment and bedside swallow evaluation to assess current cognitive, communicative and swallowing function. See above for details.    SLP Assessment  Patient will need skilled Speech Lanaguage Pathology Services during CIR admission    Recommendations  SLP Diet  Recommendations: Dysphagia 1 (Puree);Nectar Liquid Administration via: Cup Medication Administration: Crushed with puree Supervision: Full supervision/cueing for compensatory strategies Compensations: Minimize environmental distractions;Slow rate;Small sips/bites;Monitor for anterior loss Postural Changes and/or Swallow Maneuvers: Seated upright 90 degrees Oral Care Recommendations: Oral care BID Patient destination: Home Follow up Recommendations: Home Health SLP;Outpatient SLP;24 hour supervision/assistance Equipment Recommended: None recommended by SLP    SLP Frequency 3 to 5 out of 7 days   SLP Duration  SLP Intensity  SLP Treatment/Interventions 20-21 days  Minumum of 1-2 x/day, 30 to 90 minutes  Cognitive remediation/compensation;Cueing hierarchy;DME/adaptive equipment instruction;Dysphagia/aspiration precaution training;Functional tasks;Internal/external aids;Multimodal communication approach;Oral motor exercises;Patient/family education;Speech/Language facilitation;Therapeutic Activities    Pain None reported to SLP  SLP Evaluation Cognition Overall Cognitive Status: Impaired/Different from baseline Arousal/Alertness: Awake/alert Orientation Level: Oriented X4 Year: 2025 Month: August Day of Week: Correct Attention: Focused;Sustained Focused Attention: Appears intact Sustained Attention: Impaired Sustained Attention Impairment: Verbal basic;Functional basic Memory: Impaired Memory Impairment: Decreased short term memory;Decreased recall of new information Decreased Short Term Memory: Verbal basic;Functional basic Awareness: Impaired Awareness Impairment: Intellectual impairment Problem Solving: Impaired Problem Solving Impairment: Verbal complex;Functional complex Behaviors: Other (comment) (lethargic) Safety/Judgment: Impaired  Comprehension Auditory Comprehension Overall Auditory Comprehension: Appears within functional limits for tasks  assessed Expression Expression Primary Mode of Expression: Verbal Verbal Expression Overall Verbal Expression: Appears within functional limits for tasks assessed Initiation: No impairment Interfering Components: Speech intelligibility Written Expression Dominant Hand: Right Oral Motor Oral Motor/Sensory Function Overall Oral Motor/Sensory Function: Moderate impairment Facial ROM: Reduced left Facial Symmetry: Abnormal symmetry  left Facial Strength: Reduced left Facial Sensation: Within Functional Limits Lingual ROM: Reduced left;Suspected CN XII (hypoglossal) dysfunction Lingual Symmetry: Abnormal symmetry left;Suspected CN XII (hypoglossal) dysfunction Lingual Strength: Reduced;Suspected CN XII (hypoglossal) dysfunction Velum: Within Functional Limits Mandible: Within Functional Limits Motor Speech Overall Motor Speech: Impaired Respiration: Impaired Level of Impairment: Phrase Phonation: Low vocal intensity;Breathy Resonance: Within functional limits Articulation: Impaired Level of Impairment: Word Intelligibility: Intelligibility reduced Word: 50-74% accurate Phrase: 25-49% accurate Effective Techniques: Increased vocal intensity;Slow rate;Over-articulate  Care Tool Care Tool Cognition Ability to hear (with hearing aid or hearing appliances if normally used Ability to hear (with hearing aid or hearing appliances if normally used): 0. Adequate - no difficulty in normal conservation, social interaction, listening to TV   Expression of Ideas and Wants Expression of Ideas and Wants: 2. Frequent difficulty - frequently exhibits difficulty with expressing needs and ideas   Understanding Verbal and Non-Verbal Content Understanding Verbal and Non-Verbal Content: 3. Usually understands - understands most conversations, but misses some part/intent of message. Requires cues at times to understand  Memory/Recall Ability Memory/Recall Ability : Current season;Location of own  room;Staff names and faces    Bedside Swallowing Assessment General Previous Swallow Assessment: 8/4 Diet Prior to this Study: Dysphagia 1 (pureed);Mildly thick liquids (Level 2, nectar thick) Respiratory Status: Room air Behavior/Cognition: Cooperative;Pleasant mood;Lethargic/Drowsy;Impulsive Oral Cavity - Dentition: Adequate natural dentition Self-Feeding Abilities: Needs assist Vision: Functional for self-feeding Patient Positioning: Upright in chair/Tumbleform Volitional Cough: Strong Volitional Swallow: Able to elicit  Ice Chips Ice chips: Not tested Thin Liquid Thin Liquid: Not tested Nectar Thick Nectar Thick Liquid: Impaired Presentation: Cup;Self Fed Oral Phase Impairments: Reduced labial seal;Poor awareness of bolus Oral phase functional implications: Left anterior spillage Honey Thick Honey Thick Liquid: Not tested Puree Puree: Impaired Presentation: Spoon;Self Fed Pharyngeal Phase Impairments: Cough - Immediate Solid Solid: Not tested BSE Assessment Risk for Aspiration Impact on safety and function: Moderate aspiration risk;Mild aspiration risk Other Related Risk Factors: Cognitive impairment  Short Term Goals: Week 1: SLP Short Term Goal 1 (Week 1): Patient will consume D1/NTL diet with use of compensatory strategies given min multimodal A SLP Short Term Goal 2 (Week 1): Patient will utilize speech intelligibility strategies to reach 75% intelligibility at the word level given mod multimodal A SLP Short Term Goal 3 (Week 1): Patient will demonstrate functional problem solving during daily tasks given mod multimodal A SLP Short Term Goal 4 (Week 1): Patient will recall and utilize memory compensatory aids given mod multimodal A SLP Short Term Goal 5 (Week 1): Patient will demonstrate awareness of cognitive and physical changes since admission given mod multimodal A  Refer to Care Plan for Long Term Goals  Recommendations for other services: None   Discharge  Criteria: Patient will be discharged from SLP if patient refuses treatment 3 consecutive times without medical reason, if treatment goals not met, if there is a change in medical status, if patient makes no progress towards goals or if patient is discharged from hospital.  The above assessment, treatment plan, treatment alternatives and goals were discussed and mutually agreed upon: by patient  Annsleigh Dragoo M.A., CCC-SLP 10/19/2023, 1:52 PM

## 2023-10-20 LAB — RENAL FUNCTION PANEL
Albumin: 3.3 g/dL — ABNORMAL LOW (ref 3.5–5.0)
Albumin: 3.7 g/dL (ref 3.5–5.0)
Anion gap: 18 — ABNORMAL HIGH (ref 5–15)
Anion gap: 15 (ref 5–15)
BUN: 117 mg/dL — ABNORMAL HIGH (ref 6–20)
BUN: 50 mg/dL — ABNORMAL HIGH (ref 6–20)
CO2: 23 mmol/L (ref 22–32)
CO2: 27 mmol/L (ref 22–32)
Calcium: 9.9 mg/dL (ref 8.9–10.3)
Calcium: 9.9 mg/dL (ref 8.9–10.3)
Chloride: 91 mmol/L — ABNORMAL LOW (ref 98–111)
Chloride: 91 mmol/L — ABNORMAL LOW (ref 98–111)
Creatinine, Ser: 16.05 mg/dL — ABNORMAL HIGH (ref 0.61–1.24)
Creatinine, Ser: 7.66 mg/dL — ABNORMAL HIGH (ref 0.61–1.24)
GFR, Estimated: 3 mL/min — ABNORMAL LOW (ref >60)
GFR, Estimated: 8 mL/min — ABNORMAL LOW (ref >60)
Glucose, Bld: 129 mg/dL — ABNORMAL HIGH (ref 70–99)
Glucose, Bld: 152 mg/dL — ABNORMAL HIGH (ref 70–99)
Phosphorus: 11.4 mg/dL — ABNORMAL HIGH (ref 2.5–4.6)
Phosphorus: 4.2 mg/dL (ref 2.5–4.6)
Potassium: 5.1 mmol/L (ref 3.5–5.1)
Potassium: 3.8 mmol/L (ref 3.5–5.1)
Sodium: 132 mmol/L — ABNORMAL LOW (ref 135–145)
Sodium: 133 mmol/L — ABNORMAL LOW (ref 135–145)

## 2023-10-20 LAB — CBC WITH DIFFERENTIAL/PLATELET
Abs Immature Granulocytes: 0.16 K/uL — ABNORMAL HIGH (ref 0.00–0.07)
Basophils Absolute: 0.1 K/uL (ref 0.0–0.1)
Basophils Relative: 1 %
Eosinophils Absolute: 0.5 K/uL (ref 0.0–0.5)
Eosinophils Relative: 5 %
HCT: 33.3 % — ABNORMAL LOW (ref 39.0–52.0)
Hemoglobin: 10.6 g/dL — ABNORMAL LOW (ref 13.0–17.0)
Immature Granulocytes: 2 %
Lymphocytes Relative: 18 %
Lymphs Abs: 1.7 K/uL (ref 0.7–4.0)
MCH: 31.5 pg (ref 26.0–34.0)
MCHC: 31.8 g/dL (ref 30.0–36.0)
MCV: 99.1 fL (ref 80.0–100.0)
Monocytes Absolute: 1.4 K/uL — ABNORMAL HIGH (ref 0.1–1.0)
Monocytes Relative: 14 %
Neutro Abs: 6 K/uL (ref 1.7–7.7)
Neutrophils Relative %: 60 %
Platelets: 381 K/uL (ref 150–400)
RBC: 3.36 MIL/uL — ABNORMAL LOW (ref 4.22–5.81)
RDW: 13.2 % (ref 11.5–15.5)
WBC: 9.8 K/uL (ref 4.0–10.5)
nRBC: 0 % (ref 0.0–0.2)

## 2023-10-20 LAB — GLUCOSE, CAPILLARY
Glucose-Capillary: 110 mg/dL — ABNORMAL HIGH (ref 70–99)
Glucose-Capillary: 134 mg/dL — ABNORMAL HIGH (ref 70–99)
Glucose-Capillary: 117 mg/dL — ABNORMAL HIGH (ref 70–99)
Glucose-Capillary: 95 mg/dL (ref 70–99)

## 2023-10-20 MED ORDER — LIDOCAINE HCL (PF) 1 % IJ SOLN: 5.0000 mL | INTRAMUSCULAR | Status: DC | PRN

## 2023-10-20 MED ORDER — PENTAFLUOROPROP-TETRAFLUOROETH EX AERO
1.0000 | INHALATION_SPRAY | CUTANEOUS | Status: DC | PRN
Start: 2023-10-20 — End: 2023-10-20

## 2023-10-20 MED ORDER — HEPARIN SODIUM (PORCINE) 1000 UNIT/ML IJ SOLN
INTRAMUSCULAR | Status: AC
  Filled 2023-10-20: qty 6

## 2023-10-20 MED ORDER — HEPARIN SODIUM (PORCINE) 1000 UNIT/ML DIALYSIS
2000.0000 [IU] | INTRAMUSCULAR | Status: DC | PRN
  Administered 2023-10-20: 2000 [IU] via INTRAVENOUS_CENTRAL
  Filled 2023-10-20: qty 2

## 2023-10-20 MED ORDER — CARVEDILOL 12.5 MG PO TABS
12.5000 mg | ORAL_TABLET | Freq: Two times a day (BID) | ORAL | Status: DC
  Administered 2023-10-20 – 2023-10-29 (×24): 12.5 mg via ORAL
  Filled 2023-10-20 (×18): qty 1

## 2023-10-20 MED ORDER — HEPARIN SODIUM (PORCINE) 1000 UNIT/ML DIALYSIS
4000.0000 [IU] | INTRAMUSCULAR | Status: DC | PRN
  Administered 2023-10-20: 4000 [IU] via INTRAVENOUS_CENTRAL
  Filled 2023-10-20: qty 4

## 2023-10-20 MED ORDER — HEPARIN SODIUM (PORCINE) 1000 UNIT/ML IJ SOLN
INTRAMUSCULAR | Status: AC
  Filled 2023-10-20: qty 2

## 2023-10-20 MED ORDER — DIPHENHYDRAMINE HCL 25 MG PO CAPS
25.0000 mg | ORAL_CAPSULE | Freq: Four times a day (QID) | ORAL | Status: DC | PRN
  Administered 2023-10-20 – 2023-10-28 (×5): 25 mg via ORAL
  Filled 2023-10-20 (×4): qty 1

## 2023-10-20 MED ORDER — LIDOCAINE-PRILOCAINE 2.5-2.5 % EX CREA
1.0000 | TOPICAL_CREAM | CUTANEOUS | Status: DC | PRN
Start: 2023-10-20 — End: 2023-10-20

## 2023-10-20 MED ORDER — CAMPHOR-MENTHOL 0.5-0.5 % EX LOTN
TOPICAL_LOTION | CUTANEOUS | Status: DC | PRN
  Administered 2023-10-20: 1 via TOPICAL
  Filled 2023-10-20 (×2): qty 222

## 2023-10-20 MED ORDER — HEPARIN SODIUM (PORCINE) 1000 UNIT/ML DIALYSIS: 1000.0000 [IU] | INTRAMUSCULAR | Status: DC | PRN

## 2023-10-20 NOTE — Evaluation (Addendum)
 Physical Therapy Assessment and Plan  Patient Details  Name: Nathan Campbell MRN: [id number] Date of Birth: Oct 17, 1975  PT Diagnosis: Difficulty walking, Dizziness and giddiness, Hemiparesis non-dominant, and Impaired sensation Rehab Potential: Good ELOS: 17-21 days   Today's Date: 10/19/2023 PT Individual Time: 9152-8996 PT Individual Time Calculation (min): 76 min  Hospital Problem: Active Problems:   * No active hospital problems. *   Past Medical History:  Past Medical History:  Diagnosis Date   Acute combined systolic and diastolic CHF, NYHA class 4 (HCC) 06/10/2016   Anemia    Cardiomyopathy (HCC) 06/14/2016   CHF (congestive heart failure) (HCC)    COVID 10/2020   Dyspnea    ESRD on hemodialysis (HCC)    TTS at [facility]   Hypertension    Hypertensive heart and kidney disease with heart failure (HCC) 06/14/2016   Hypertensive heart disease with congestive heart failure (HCC)    Obesity    Past Surgical History:  Past Surgical History:  Procedure Laterality Date   A/V FISTULAGRAM Left 05/03/2022   Procedure: A/V Fistulagram;  Surgeon: Eliza Lonni RAMAN, MD;  Location: [facility] INVASIVE CV LAB;  Service: Cardiovascular;  Laterality: Left;   AV FISTULA PLACEMENT Left 11/11/2020   Procedure: LEFT ARM First Stage Basilic Vein Fistula Creation;  Surgeon: Sheree Penne Lonni, MD;  Location: [facility] OR;  Service: Vascular;  Laterality: Left;   BASCILIC VEIN TRANSPOSITION Left 01/23/2021   Procedure: Second Stage Basilic Vein Transposition of Left Arm Arteriovenous  Fistula;  Surgeon: Sheree Penne Lonni, MD;  Location: [facility] OR;  Service: Vascular;  Laterality: Left;  Block  to LMA    COLONOSCOPY     FISTULA SUPERFICIALIZATION Left 05/07/2022   Procedure: PLICATION OF ANEURYSM, LEFT ARM FISTULA;  Surgeon: Eliza Lonni RAMAN, MD;  Location: [facility] OR;  Service: Vascular;  Laterality: Left;   IR CT HEAD LTD  10/07/2023   IR CT HEAD LTD  10/07/2023   IR PATIENT EVAL TECH 0-60  MINS  10/07/2023   IR PERCUTANEOUS ART THROMBECTOMY/INFUSION INTRACRANIAL INC DIAG ANGIO  10/07/2023   NO PAST SURGERIES     PERIPHERAL VASCULAR BALLOON ANGIOPLASTY Left 05/03/2022   Procedure: PERIPHERAL VASCULAR BALLOON ANGIOPLASTY;  Surgeon: Eliza Lonni RAMAN, MD;  Location: [facility] INVASIVE CV LAB;  Service: Cardiovascular;  Laterality: Left;  AVF   RADIOLOGY WITH ANESTHESIA N/A 10/07/2023   Procedure: RADIOLOGY WITH ANESTHESIA;  Surgeon: Radiologist, Medication, MD;  Location: [facility] OR;  Service: Radiology;  Laterality: N/A;    Assessment & Plan Clinical Impression: Patient is a 48 y.o. with a significant past medical history, including systolic and diastolic congestive heart failure, anemia, HTN, obesity, and end-stage renal disease on hemodialysis TTHS. Presented to [facility] on 10/07/2023 after experiencing an acute onset of left-sided weakness, left side neglect, dysarthria and right gaze preference.  The symptoms began earlier in the morning, and the patient presented with emergency medical services as a code stroke. Notably, the patient had been compliant with his hemodialysis treatment prior to this period. In the emergency department vitals remained stable. He was intubated for airway protection and because of agitation and restlessness. A chest X-ray showed no significant disease. Intravenous Cleviprex  was initiated. Initial CT head revealed right MCA territory ischemic changes with calcific density in proximal right M1 concerning for calcific embolus.  The patient was administered TNK, and IR was consulted and Dr. Dolphus performed a cerebral angiogram which revealed a right M1 occlusion. Subsequently, the patient underwent a thrombectomy of the right M1,  achieving TICI 2B revascularization of the occluded right middle cerebral artery M1 segment, including the superior and inferior divisions to the right M2 region. An MRI demonstrated a large right MCA territory CVA with associated  diffuse cortical thickening and some mass effect but no significant midline shift. MRA revealed distal right M2 occlusion but no other new occlusions. He was  extubated on 10/11/2023. On 7/30, the patient vomited and aspirated, leading to the placement of a nasogastric tube. An abdominal X-ray indicated ileus.  Echocardiogram showed an ejection fraction of 50-55%. Prior to admission, the patient was not on any antithrombotics but was now placed on Aspirin  81mg  and Plavix  75mg , with DAPT recommended for three months followed by Aspirin  alone. Speech therapy was consulted, and a modified barium swallow study was conducted. The patient was advised to follow a dysphagia level 1 pureed diet with nectar-thick liquids. Patient was independent and working prior to admission. He lives in a one level apartment. Currently requiring max-mod assistance for ADL's and mod assist +2 for transfers and mobility. Therapy evaluations completed due to patient decreased functional mobility was admitted for a comprehensive rehab program. Patient transferred to CIR on 10/18/2023 .   Patient currently requires up to mod assist with mobility secondary to muscle weakness, decreased cardiorespiratoy endurance, unbalanced muscle activation and decreased coordination, decreased visual perceptual skills and hemianopsia, decreased attention to left and decreased motor planning, decreased initiation, decreased attention, decreased awareness, decreased problem solving, and decreased safety awareness, and decreased sitting balance, decreased standing balance, and decreased balance strategies.  Prior to hospitalization, patient was independent  with mobility and lived with Family (sister and her two teen children) in a Apartment home.  Home access is 1 flightStairs to enter.  Patient will benefit from skilled PT intervention to maximize safe functional mobility, minimize fall risk, and decrease caregiver burden for planned discharge home with 24  hour supervision.  Anticipate patient will benefit from follow up HH at discharge.  PT - End of Session Activity Tolerance: Tolerates 30+ min activity with multiple rests Endurance Deficit: Yes PT Assessment Rehab Potential (ACUTE/IP ONLY): Good PT Barriers to Discharge: Inaccessible home environment;Decreased caregiver support;Insurance for SNF coverage;Weight;Hemodialysis PT Patient demonstrates impairments in the following area(s): Balance;Endurance;Motor;Perception;Safety;Sensory PT Transfers Functional Problem(s): Bed Mobility;Bed to Chair;Car;Furniture;Floor PT Locomotion Functional Problem(s): Ambulation;Stairs PT Plan PT Intensity: Minimum of 1-2 x/day ,45 to 90 minutes PT Frequency: 5 out of 7 days PT Duration Estimated Length of Stay: 17-21 days PT Treatment/Interventions: Ambulation/gait training;Cognitive remediation/compensation;Discharge planning;DME/adaptive equipment instruction;Functional mobility training;Pain management;Psychosocial support;Splinting/orthotics;Therapeutic Activities;UE/LE Strength taining/ROM;Visual/perceptual remediation/compensation;Balance/vestibular training;Community reintegration;Disease management/prevention;Functional electrical stimulation;Neuromuscular re-education;Patient/family education;Skin care/wound management;Stair training;Therapeutic Exercise;UE/LE Coordination activities;Wheelchair propulsion/positioning PT Transfers Anticipated Outcome(s): supervision PT Locomotion Anticipated Outcome(s): supervision PT Recommendation Recommendations for Other Services: Therapeutic Recreation consult Therapeutic Recreation Interventions: Pet therapy;Kitchen group;Stress management;Outing/community reintergration Follow Up Recommendations: Home health PT;Outpatient PT;24 hour supervision/assistance Patient destination: Home Equipment Recommended: To be determined   PT Evaluation Precautions/Restrictions Precautions Precautions:  Fall Precaution/Restrictions Comments: L hemipareisis UE>LE, L hemianopsia, cortrak, watch SpO2 and RR Restrictions Weight Bearing Restrictions Per Provider Order: No General   Vital SignsTherapy Vitals Temp: 98.7 F (37.1 C) Pulse Rate: 89 Resp: (!) 22 BP: (!) 153/101 Oxygen Therapy SpO2: 100 % O2 Device: Nasal Cannula O2 Flow Rate (L/min): 3 L/min Patient Activity (if Appropriate): In bed Pulse Oximetry Type: Continuous Pain Pain Assessment Pain Scale: 0-10 Pain Score: 0-No pain Pain Interference Pain Interference Pain Effect on Sleep: 1. Rarely or not at all;2. Occasionally Pain Interference with Therapy Activities:  1. Rarely or not at all;2. Occasionally Pain Interference with Day-to-Day Activities: 1. Rarely or not at all;2. Occasionally Home Living/Prior Functioning Home Living Available Help at Discharge: Family Type of Home: Apartment Home Access: Stairs to enter Secretary/administrator of Steps: 1 flight Entrance Stairs-Rails: Right;Left Home Layout: One level Bathroom Shower/Tub: Engineer, manufacturing systems: Standard  Lives With: Family (sister and her two teen children) Prior Function Level of Independence: Independent with basic ADLs;Independent with homemaking with ambulation;Independent with gait;Independent with transfers  Able to Take Stairs?: Yes Driving: Yes Vocation: Other (Comment) (was working but unsire if part or full time.) Vision/Perception  Vision - History Ability to See in Adequate Light: 1 Impaired Vision - Assessment Eye Alignment: Within Functional Limits Alignment/Gaze Preference: Within Defined Limits Tracking/Visual Pursuits: Decreased smoothness of horizontal tracking;Decreased smoothness of eye movement to LEFT superior field;Decreased smoothness of eye movement to LEFT inferior field;Requires cues, head turns, or add eye shifts to track;Impaired - to be further tested in functional context Saccades: Additional head turns  occurred during testing;Decreased speed of saccadic movement;Other (comment);Undershoots (in moving eyes toward L side, stops in midline and requries vc to continue to L side) Convergence: Other (comment) (with movement less than 5in from face, eyes close and he falls asleep) Perception Perception: Impaired Preception Impairment Details: Inattention/Neglect Praxis Praxis: WFL  Cognition Overall Cognitive Status: Impaired/Different from baseline Arousal/Alertness: Awake/alert Orientation Level: Oriented X4 Sustained Attention: Impaired Memory: Impaired Awareness: Impaired Problem Solving: Impaired Behaviors: Other (comment) (lethargic) Safety/Judgment: Impaired Sensation Sensation Light Touch: Impaired by gross assessment (decreased to LLE; unable to verbalize exact feeling but does note diminished feeling) Additional Comments: reports diminished sensation to lower leg and distal but unable to qualify sensation Coordination Gross Motor Movements are Fluid and Coordinated: No Fine Motor Movements are Fluid and Coordinated: No Heel Shin Test: slow to coordinate/ plan with LLE but able to perform, RLE WNL Motor  Motor Motor: Hemiplegia Motor - Skilled Clinical Observations: LUE with minimal shoulder activation, no elbow activation, minimal finger movement, LLE with decreased control/ strength compared to RLE   Trunk/Postural Assessment  Cervical Assessment Cervical Assessment: Within Functional Limits Thoracic Assessment Thoracic Assessment: Within Functional Limits Lumbar Assessment Lumbar Assessment: Within Functional Limits Postural Control Postural Control: Deficits on evaluation Trunk Control: requiring minA to control initially and improving with increasing control throughout eval Righting Reactions: delayed Protective Responses: delayed/ decreased  Balance Balance Balance Assessed: Yes Static Sitting Balance Static Sitting - Balance Support: Right upper extremity  supported;Feet supported Static Sitting - Level of Assistance: 5: Stand by assistance Dynamic Sitting Balance Dynamic Sitting - Balance Support: Right upper extremity supported;During functional activity;Feet supported Dynamic Sitting - Level of Assistance: 4: Min assist Static Standing Balance Static Standing - Balance Support: Right upper extremity supported;During functional activity Static Standing - Level of Assistance: 4: Min assist Dynamic Standing Balance Dynamic Standing - Balance Support: Right upper extremity supported;During functional activity Dynamic Standing - Level of Assistance: 3: Mod assist Extremity Assessment      RLE Assessment RLE Assessment: Within Functional Limits LLE Assessment LLE Assessment: Exceptions to [facility] General Strength Comments: grossly 4-/5 proximally to 4/5 distally with 4+/5 with knee extension  Care Tool Care Tool Bed Mobility Roll left and right activity   Roll left and right assist level: Minimal Assistance - Patient > 75%    Sit to lying activity   Sit to lying assist level: Minimal Assistance - Patient > 75%    Lying to sitting on side of bed activity  Lying to sitting on side of bed assist level: the ability to move from lying on the back to sitting on the side of the bed with no back support.: Minimal Assistance - Patient > 75%     Care Tool Transfers Sit to stand transfer   Sit to stand assist level: Minimal Assistance - Patient > 75%    Chair/bed transfer   Chair/bed transfer assist level: Minimal Assistance - Patient > 75%    Car transfer   Car transfer assist level: Minimal Assistance - Patient > 75%      Care Tool Locomotion Ambulation   Assist level: Minimal Assistance - Patient > 75% Assistive device: Cane-quad Max distance: 35 ft  Walk 10 feet activity   Assist level: Minimal Assistance - Patient > 75% Assistive device: Cane-quad   Walk 50 feet with 2 turns activity Walk 50 feet with 2 turns activity did not  occur: Safety/medical concerns      Walk 150 feet activity Walk 150 feet activity did not occur: Safety/medical concerns      Walk 10 feet on uneven surfaces activity Walk 10 feet on uneven surfaces activity did not occur: Safety/medical concerns      Stairs   Assist level: Moderate Assistance - Patient - 50 - 74% Stairs assistive device: 1 hand rail Max number of stairs: 4  Walk up/down 1 step activity   Walk up/down 1 step (curb) assist level: Moderate Assistance - Patient - 50 - 74% Walk up/down 1 step or curb assistive device: 1 hand rail  Walk up/down 4 steps activity   Walk up/down 4 steps assist level: Moderate Assistance - Patient - 50 - 74% Walk up/down 4 steps assistive device: 1 hand rail  Walk up/down 12 steps activity Walk up/down 12 steps activity did not occur: Safety/medical concerns      Pick up small objects from floor Pick up small object from the floor (from standing position) activity did not occur: Safety/medical concerns      Wheelchair Is the patient using a wheelchair?: Yes Type of Wheelchair: Manual   Wheelchair assist level: Dependent - Patient 0% Max wheelchair distance: 300 ft  Wheel 50 feet with 2 turns activity   Assist Level: Dependent - Patient 0%  Wheel 150 feet activity   Assist Level: Dependent - Patient 0%    Refer to Care Plan for Long Term Goals  SHORT TERM GOAL WEEK 1 PT Short Term Goal 1 (Week 1): Pt will demosntrate bed mobility with CGA and good technique. PT Short Term Goal 2 (Week 1): pt will perform standing transfers with consistent SBA/ CGA. PT Short Term Goal 3 (Week 1): Pt will perform ambulation up to 100 ft using LRAD with CGA. PT Short Term Goal 4 (Week 1): Pt will demonstrate stair navigation up /down at least 8 steps with overall MinA. PT Short Term Goal 5 (Week 1): Pt will complete appropriate outcome measure.  Recommendations for other services: Therapeutic Recreation  Pet therapy, Kitchen group, Stress management,  and Outing/community reintegration  Skilled Therapeutic Intervention Mobility Bed Mobility Bed Mobility: Rolling Right;Right Sidelying to Sit;Supine to Sit;Sit to Supine Rolling Right: Supervision/verbal cueing Right Sidelying to Sit: Minimal Assistance - Patient > 75% Supine to Sit: Minimal Assistance - Patient > 75% Sit to Supine: Minimal Assistance - Patient > 75% Transfers Transfers: Sit to Stand;Stand to Sit;Stand Pivot Transfers Sit to Stand: Contact Guard/Touching assist Stand to Sit: Contact Guard/Touching assist Stand Pivot Transfers: Minimal Assistance - Patient > 75%  Transfer (Assistive device): Large base quad cane Locomotion  Gait Ambulation: Yes Gait Assistance: Contact Guard/Touching assist;Minimal Assistance - Patient > 75% Gait Distance (Feet): 35 Feet Assistive device: Large base quad cane;1 person hand held assist Gait Gait: Yes Gait Pattern: Impaired Gait Pattern: Step-through pattern;Decreased stance time - left;Decreased step length - left;Decreased weight shift to left Gait velocity: decreased Stairs / Additional Locomotion Stairs: Yes Stairs Assistance: Moderate Assistance - Patient 50 - 74% Stair Management Technique: One rail Right Number of Stairs: 4 Height of Stairs: 6 Wheelchair Mobility Wheelchair Mobility: No  Skilled Intervention: PT Evaluation completed; see above for results. PT educated patient in roles of PT vs OT, PT POC, rehab potential, rehab goals, and discharge recommendations along with recommendation for follow-up rehabilitation services. Individual treatment initiated:  Patient supine in bed upon PT arrival. Patient initially asleep and takes time to rouse. Pt is lethargic initially and continues to fall asleep during chart review and interview despite agreement to PT session. Once mobility initiated, pt more alert.   No pain complaint at start of session.  Therapeutic Activity: Bed Mobility: Patient performed supine > sit with  MinA to reach upright seated position on EOB. VC/ tc for technique and effort. Balance fair initially and improving to fair +/ good- throughout leaning and reaching activities.   When moving feeding pump, line movement of Cortrak irritates pt with noted discomfort.  Transfers: Patient performed sit <> stand transfers throughout session with MinA initially and improving to CGA. Provided vc/ tc for technique and forward lean.   Gait Training:  Patient ambulated up to 35 ft using LBQC with MinA. Demonstrated increased lateral lean initially with decreased RLE step height/ length, time on RLE. Provided vc/tc for corrections with light improvement.   Pt able to navigate steps using RHR and leading with RLE. Attempts to reciprocate steps to ascend requiring MinA when leading with LLE. Minimally impulsive with no correction made following vc to perform step-to pattern. Descends with step-to pattern and requiring ModA to L side for safe descent. Guard to knees throughout. +2 available for safety/ weakness throughout.  Neuromuscular Re-ed: NMR facilitated during session with focus on seated/ standing balance. Pt guided in reaching activities throughout in order to assess balance. Is Fair/ Fair+ with seated balance as well as standing balance. Support provided to L hemibody - especially LUE. NMR performed for improvements in motor control and coordination, balance, sequencing, judgement, and self confidence/ efficacy in performing all aspects of mobility at highest level of independence.   Patient seated upright in w/c at end of session with brakes locked, no alarm set as none in room but NTs providing supervision with breakfast completion, and all needs within reach.    Discharge Criteria: Patient will be discharged from PT if patient refuses treatment 3 consecutive times without medical reason, if treatment goals not met, if there is a change in medical status, if patient makes no progress towards goals or  if patient is discharged from hospital.  The above assessment, treatment plan, treatment alternatives and goals were discussed and mutually agreed upon: by patient  Mliss DELENA Milliner PT, DPT, CSRS 10/19/2023, 7:09 AM

## 2023-10-20 NOTE — Plan of Care (Signed)
  Problem: Consults Goal: RH STROKE PATIENT EDUCATION Description: See Patient Education module for education specifics  Outcome: Progressing   Problem: RH BOWEL ELIMINATION Goal: RH STG MANAGE BOWEL WITH ASSISTANCE Description: STG Manage Bowel with mod I Assistance. Outcome: Progressing Goal: RH STG MANAGE BOWEL W/MEDICATION W/ASSISTANCE Description: STG Manage Bowel with Medication with mod I Assistance. Outcome: Progressing   Problem: RH SAFETY Goal: RH STG ADHERE TO SAFETY PRECAUTIONS W/ASSISTANCE/DEVICE Description: STG Adhere to Safety Precautions With cues Assistance/Device. Outcome: Progressing   Problem: RH PAIN MANAGEMENT Goal: RH STG PAIN MANAGED AT OR BELOW PT'S PAIN GOAL Description: Pain < 4 with prns Outcome: Progressing   Problem: RH KNOWLEDGE DEFICIT Goal: RH STG INCREASE KNOWLEDGE OF HYPERTENSION Description: Patient and family will be able to manage HTN using educational resources for medications and dietary modification independently Outcome: Progressing Goal: RH STG INCREASE KNOWLEDGE OF DYSPHAGIA/FLUID INTAKE Description: Patient and family will be able to manage dysphagia using educational resources for medications and dietary modification independently Outcome: Progressing Goal: RH STG INCREASE KNOWLEGDE OF HYPERLIPIDEMIA Description: Patient and family will be able to manage HLD using educational resources for medications and dietary modification independently Outcome: Progressing Goal: RH STG INCREASE KNOWLEDGE OF STROKE PROPHYLAXIS Description: Patient and family will be able to manage secondary risks using educational resources for medications and dietary modification independently Outcome: Progressing

## 2023-10-20 NOTE — Plan of Care (Signed)
  Problem: RH Balance Goal: LTG Patient will maintain dynamic sitting balance (PT) Description: LTG:  Patient will maintain dynamic sitting balance with assistance during mobility activities (PT) Flowsheets (Taken 10/20/2023 0730) LTG: Pt will maintain dynamic sitting balance during mobility activities with:: Independent with assistive device  Goal: LTG Patient will maintain dynamic standing balance (PT) Description: LTG:  Patient will maintain dynamic standing balance with assistance during mobility activities (PT) Flowsheets (Taken 10/20/2023 0730) LTG: Pt will maintain dynamic standing balance during mobility activities with:: Supervision/Verbal cueing   Problem: Sit to Stand Goal: LTG:  Patient will perform sit to stand with assistance level (PT) Description: LTG:  Patient will perform sit to stand with assistance level (PT) Flowsheets (Taken 10/20/2023 0730) LTG: PT will perform sit to stand in preparation for functional mobility with assistance level: Independent with assistive device   Problem: RH Bed Mobility Goal: LTG Patient will perform bed mobility with assist (PT) Description: LTG: Patient will perform bed mobility with assistance, with/without cues (PT). Flowsheets (Taken 10/20/2023 0730) LTG: Pt will perform bed mobility with assistance level of: Independent with assistive device    Problem: RH Bed to Chair Transfers Goal: LTG Patient will perform bed/chair transfers w/assist (PT) Description: LTG: Patient will perform bed to chair transfers with assistance (PT). Flowsheets (Taken 10/20/2023 0730) LTG: Pt will perform Bed to Chair Transfers with assistance level: Independent with assistive device    Problem: RH Car Transfers Goal: LTG Patient will perform car transfers with assist (PT) Description: LTG: Patient will perform car transfers with assistance (PT). Flowsheets (Taken 10/20/2023 0730) LTG: Pt will perform car transfers with assist:: Supervision/Verbal cueing   Problem:  RH Furniture Transfers Goal: LTG Patient will perform furniture transfers w/assist (OT/PT) Description: LTG: Patient will perform furniture transfers  with assistance (OT/PT). Flowsheets (Taken 10/20/2023 0730) LTG: Pt will perform furniture transfers with assist:: Supervision/Verbal cueing   Problem: RH Floor Transfers Goal: LTG Patient will perform floor transfers w/assist (PT) Description: LTG: Patient will perform floor transfers with assistance (PT). Flowsheets (Taken 10/20/2023 0730) LTG: PT WILL PERFORM FLOOR TRANFERS  WITH  ASSIST:: Contact Guard/Touching assist   Problem: RH Ambulation Goal: LTG Patient will ambulate in controlled environment (PT) Description: LTG: Patient will ambulate in a controlled environment, # of feet with assistance (PT). Flowsheets (Taken 10/20/2023 0730) LTG: Pt will ambulate in controlled environ  assist needed:: Supervision/Verbal cueing LTG: Ambulation distance in controlled environment: more than 150 ft using LRAD Goal: LTG Patient will ambulate in home environment (PT) Description: LTG: Patient will ambulate in home environment, # of feet with assistance (PT). Flowsheets (Taken 10/20/2023 0730) LTG: Pt will ambulate in home environ  assist needed:: Supervision/Verbal cueing LTG: Ambulation distance in home environment: up to 50 ft including corners and maneuvering all obstacles in home   Problem: RH Stairs Goal: LTG Patient will ambulate up and down stairs w/assist (PT) Description: LTG: Patient will ambulate up and down # of stairs with assistance (PT) Flowsheets (Taken 10/20/2023 0730) LTG: Pt will ambulate up/down stairs assist needed:: Supervision/Verbal cueing LTG: Pt will  ambulate up and down number of stairs: at least 16 steps using HR setup as per home environment

## 2023-10-20 NOTE — Progress Notes (Signed)
 Occupational Therapy Note  Patient Details  Name: Nathan Campbell MRN: [id number] Date of Birth: 09/01/1975  Today's Date: 10/20/2023 OT Missed Time: 75 Minutes Missed Time Reason: Unavailable (comment);Other (comment) (pt off unit at HD)  Pt out of room at HD, pt missed 75 mins of OT session.    Ronal Gift [facility] 10/20/2023, 1:20 PM

## 2023-10-20 NOTE — Progress Notes (Signed)
 PROGRESS NOTE   Subjective/Complaints:  CT chest reviewed , labs reviewed , appreciate nephro assist  Vasovagal episode after HD yesterday , reportedly had 2L removed  Intake improved now off TF , po fluids low   ROS-limited by mental status   Objective:   CT CHEST WO CONTRAST Result Date: 10/19/2023 CLINICAL DATA:  Pneumonia, complication suspected, xray done EXAM: CT CHEST WITHOUT CONTRAST TECHNIQUE: Multidetector CT imaging of the chest was performed following the standard protocol without IV contrast. RADIATION DOSE REDUCTION: This exam was performed according to the departmental dose-optimization program which includes automated exposure control, adjustment of the mA and/or kV according to patient size and/or use of iterative reconstruction technique. COMPARISON:  Chest x-ray 10/18/2023, chest x-ray 10/12/2023 FINDINGS: Cardiovascular: Normal heart size. No significant pericardial effusion. The thoracic aorta is normal in caliber. No atherosclerotic plaque of the thoracic aorta. No coronary artery calcifications. Mediastinum/Nodes: No gross hilar adenopathy, noting limited sensitivity for the detection of hilar adenopathy on this noncontrast study. No enlarged mediastinal or axillary lymph nodes. Thyroid gland, trachea, and esophagus demonstrate no significant findings. Lungs/Pleura: No focal consolidation. No pulmonary nodule. No pulmonary mass. No pleural effusion. No pneumothorax. Upper Abdomen: Enteric tube with tip terminating in the lumen of the gastric antrum just proximal to the pylorus. Left adrenal gland nodule measuring 2.3 cm with a density of -84 Hounsfield units consistent with myelolipoma, no further follow-up indicated. Musculoskeletal: No chest wall abnormality.  Bilateral gynecomastia. No suspicious lytic or blastic osseous lesions. No acute displaced fracture. IMPRESSION: No acute intrathoracic abnormality with limited  evaluation on this noncontrast study. Electronically Signed   By: Morgane  Naveau M.D.   On: 10/19/2023 22:38   DG Chest 2 View Result Date: 10/18/2023 CLINICAL DATA:  Ischemic stroke. EXAM: CHEST - 2 VIEW COMPARISON:  Chest radiograph dated 10/17/2023. FINDINGS: Evaluation is limited due to body habitus. Enteric tube extends below the diaphragm with tip beyond the inferior margin of the image. There is cardiomegaly with vascular congestion. Evaluation of the previously described left lung base density is limited on the lateral view due to body habitus. No large pleural effusion or pneumothorax. No acute osseous pathology. IMPRESSION: 1. Cardiomegaly with vascular congestion. 2. Evaluation of the previously described left lung base density is limited on the lateral view due to body habitus. Electronically Signed   By: Vanetta Chou M.D.   On: 10/18/2023 19:35   Recent Labs    10/18/23 1000 10/20/23 0616  WBC 11.2* 9.8  HGB 10.9* 10.6*  HCT 33.9* 33.3*  PLT 384 381   Recent Labs    10/18/23 1000 10/20/23 0616  NA 135 132*  K 5.8* 5.1  CL 89* 91*  CO2 23 23  GLUCOSE 103* 129*  BUN 139* 117*  CREATININE 18.98* 16.05*  CALCIUM  9.8 9.9    Intake/Output Summary (Last 24 hours) at 10/20/2023 0757 Last data filed at 10/20/2023 0620 Gross per 24 hour  Intake 881 ml  Output 0 ml  Net 881 ml        Physical Exam: Vital Signs Blood pressure (!) 152/88, pulse 94, temperature 98.7 F (37.1 C), resp. rate (!) 23, height 6' (1.829 m), weight  132.3 kg, SpO2 100%.   General: No acute distress Mood and affect are appropriate Heart: Regular rate and rhythm no rubs murmurs or extra sounds Lungs: Clear to auscultation, breathing unlabored, no rales or wheezes Abdomen: Positive bowel sounds, soft nontender to palpation, nondistended Extremities: No clubbing, cyanosis, or edema Skin: No evidence of breakdown, no evidence of rash Neurologic: Cranial nerves II through XII intact, motor  strength is 5/5 in right deltoid, bicep, tricep, grip, hip flexor, knee extensors, ankle dorsiflexor and plantar flexor 2-/5 in LUE at delt and biceps and grip, 0/5 L finger and arm extension , 4- LLE muscle groups as above  Sensory exam normal sensation to light touch and proprioception in bilateral upper and lower extremities Cerebellar exam not tested on LUE due to weakness Musculoskeletal: pain free  range of motion in all 4 extremities. No joint swelling   Assessment/Plan: 1. Functional deficits which require 3+ hours per day of interdisciplinary therapy in a comprehensive inpatient rehab setting. Physiatrist is providing close team supervision and 24 hour management of active medical problems listed below. Physiatrist and rehab team continue to assess barriers to discharge/monitor patient progress toward functional and medical goals  Care Tool:  Bathing    Body parts bathed by patient: Chest, Abdomen, Front perineal area, Right upper leg, Left upper leg, Face   Body parts bathed by helper: Right arm, Left arm, Buttocks, Right lower leg, Left lower leg     Bathing assist Assist Level: Maximal Assistance - Patient 24 - 49%     Upper Body Dressing/Undressing Upper body dressing   What is the patient wearing?: Pull over shirt    Upper body assist Assist Level: Maximal Assistance - Patient 25 - 49%    Lower Body Dressing/Undressing Lower body dressing      What is the patient wearing?: Pants, Incontinence brief     Lower body assist Assist for lower body dressing: Maximal Assistance - Patient 25 - 49%     Toileting Toileting Toileting Activity did not occur (Clothing management and hygiene only): N/A (no void or bm) (dialysis patient)  Toileting assist Assist for toileting: Maximal Assistance - Patient 25 - 49%     Transfers Chair/bed transfer  Transfers assist     Chair/bed transfer assist level: Minimal Assistance - Patient > 75%      Locomotion Ambulation   Ambulation assist      Assist level: Minimal Assistance - Patient > 75% Assistive device: Cane-quad Max distance: 35 ft   Walk 10 feet activity   Assist     Assist level: Minimal Assistance - Patient > 75% Assistive device: Cane-quad   Walk 50 feet activity   Assist Walk 50 feet with 2 turns activity did not occur: Safety/medical concerns         Walk 150 feet activity   Assist Walk 150 feet activity did not occur: Safety/medical concerns         Walk 10 feet on uneven surface  activity   Assist Walk 10 feet on uneven surfaces activity did not occur: Safety/medical concerns         Wheelchair     Assist Is the patient using a wheelchair?: Yes Type of Wheelchair: Manual    Wheelchair assist level: Dependent - Patient 0% Max wheelchair distance: 300 ft    Wheelchair 50 feet with 2 turns activity    Assist        Assist Level: Dependent - Patient 0%   Wheelchair 150 feet activity  Assist      Assist Level: Dependent - Patient 0%   Blood pressure (!) 152/88, pulse 94, temperature 98.7 F (37.1 C), resp. rate (!) 23, height 6' (1.829 m), weight 132.3 kg, SpO2 100%.  Medical Problem List and Plan: 1. Functional deficits secondary to acute R MCA CVA with right M1 occlusion s/p TNK 7/25 and IR with TICI2b likely large vessel disease.             -patient may shower             -ELOS/Goals: 10-14 days, supervision             - Admit to CIR 2.  Antithrombotics: -DVT/anticoagulation:  Pharmaceutical: Heparin              -antiplatelet therapy: Aspirin  and Plavix  x 3 months then Aspirin  alone.  3. Pain Management: Tylenol  and Flexeril  (back pain) as needed 4. Mood/Behavior/Sleep: Therapy evaluations completed due to patient decreased functional mobility was admitted for a comprehensive rehab program.              -antipsychotic agents: N/A 5. Neuropsych/cognition: This patient is not capable of making  decisions on his own behalf. 6. Skin/Wound Care: Routine pressure relief measures 7. Fluids/Electrolytes/Nutrition: Monitor I/O and Daily weights.              - TF + protein supplements.  Dysphagia - upgraded to dysphagia 1 diet -TF decreased 35 cc an hour. consulted RD to advise on PO status- if progressing well maybe d/c free water  intake and TF at pm.                   -calorie count ordered              -continue aspiration precautions 8. ESRD on hemodialysis: BUN 139, Cr 18.98 HD on TTHS schedule- --Labs to be collected in HD             -Hyperphos/Hyperkalemia Phos > 30, K 5.8- asking Nephro whether pt needs lokelma  today or if labs were drawn pre HD 9. Chronic Anemia of ESRD: hgb 10.9 from 11.6 continue to monitor  10. Acute hypoxic/hypercapnic respiratory failure: On 2 L [place] CXR Vague lingular and left lower lobe airspace opacification   Repeat CXR done 8/5 pending.  Suspect underlying OSA will check overnite oximetry  11. Leukocytosis: WBC 16.0-12.3-11.2, afebrile see above  12. Combined systolic and diastolic HF/HTN: home meds amlodipine , isosorbide  and carvedilol .              - Monitor for orthostasis with activity.  13. Secondary HPTH: Home auryxia  cannot be given via NG tube--continue Fosrenol   14. HLD: LDL 85 Atorvastatin  40 mg  15. Obesity: BMI 39.77 educate on diet and weight loss to promote overall health and mobility.  16.  Hypertension: Long-term goal normotensive.  Continue Norvasc , Coreg , isosorbide   Vitals:   10/20/23 0702 10/20/23 0730  BP: (!) 153/101 (!) 152/88  Pulse: 89 94  Resp: (!) 22 (!) 23  Temp:    SpO2: 100%    Increase coreg  to 12.5 still hypertensive with elevated HR 17.  Hyperlipidemia: Continue statin      LOS: 2 days A FACE TO FACE EVALUATION WAS PERFORMED  Nathan Campbell 10/20/2023, 7:57 AM

## 2023-10-20 NOTE — Care Management (Signed)
 Inpatient Rehabilitation Center Individual Statement of Services  Patient Name:  Nathan Campbell  Date:  10/20/2023  Welcome to the Inpatient Rehabilitation Center.  Our goal is to provide you with an individualized program based on your diagnosis and situation, designed to meet your specific needs.  With this comprehensive rehabilitation program, you will be expected to participate in at least 3 hours of rehabilitation therapies Monday-Friday, with modified therapy programming on the weekends.  Your rehabilitation program will include the following services:  Physical Therapy (PT), Occupational Therapy (OT), Speech Therapy (ST), 24 hour per day rehabilitation nursing, Therapeutic Recreaction (TR), Psychology, Neuropsychology, Care Coordinator, Rehabilitation Medicine, Nutrition Services, Pharmacy Services, and Other  Weekly team conferences will be held on Wednesdays to discuss your progress.  Your Inpatient Rehabilitation Care Coordinator will talk with you frequently to get your input and to update you on team discussions.  Team conferences with you and your family in attendance may also be held.  Expected length of stay: 17-21 days    Overall anticipated outcome: Supervision  Depending on your progress and recovery, your program may change. Your Inpatient Rehabilitation Care Coordinator will coordinate services and will keep you informed of any changes. Your Inpatient Rehabilitation Care Coordinator's name and contact numbers are listed  below.  The following services may also be recommended but are not provided by the Inpatient Rehabilitation Center:  Driving Evaluations Home Health Rehabiltiation Services Outpatient Rehabilitation Services Vocational Rehabilitation   Arrangements will be made to provide these services after discharge if needed.  Arrangements include referral to agencies that provide these services.  Your insurance has been verified to be:  [facility] Medicare  Your primary  doctor is:  No PCP  Pertinent information will be shared with your doctor and your insurance company.  Inpatient Rehabilitation Care Coordinator:  [name] Feliciana SILK [phone] or [phone]  Information discussed with and copy given to patient by: [name] DELENA Feliciana, 10/20/2023, 2:13 PM

## 2023-10-20 NOTE — Progress Notes (Signed)
   10/20/23 1125  Vitals  Temp 98.7 F (37.1 C)  Pulse Rate 90  Resp (!) 22  BP 125/77  SpO2 98 %  O2 Device Nasal Cannula  Weight (S)  128.8 kg (Bed Scale)  Type of Weight Post-Dialysis  Oxygen Therapy  O2 Flow Rate (L/min) 3 L/min  Patient Activity (if Appropriate) In bed  Pulse Oximetry Type Continuous  Post Treatment  Dialyzer Clearance Clear  Hemodialysis Intake (mL) 0 mL  Liters Processed 79.7  Fluid Removed (mL) 2000 mL  Tolerated HD Treatment Yes  Post-Hemodialysis Comments Tx. Complete without difficulties and  UF goal obtained. Pt.VSS and Report call to 75M bedside RN, Admin Medication per.order.  AVG/AVF Arterial Site Held (minutes) 5 minutes  AVG/AVF Venous Site Held (minutes) 5 minutes   Received patient in bed to unit.  Alert and oriented.  Informed consent signed and in chart.   TX duration: 3.5  Patient tolerated well.  Transported back to the room  Alert, without acute distress.  Hand-off given to patient's nurse.   Access used: Yes Access issues: No  Total UF removed: 2000 Medication(s) given: See MAR Post HD VS: See Above Grid Post HD weight: 128.8 kg   Zebedee DELENA [facility]

## 2023-10-20 NOTE — Progress Notes (Signed)
 SLP Cancellation Note  Patient Details Name: Nathan Campbell MRN: [id number] DOB: November 11, 1975   Cancelled treatment:        1045: SLP attempted to visit patient for scheduled ST session, however patient off the unit for HD. SLP will attempt to return as patient is available and patient schedule allows.   1415: SLP attempted to visit patient to make up missed minutes from AM. Nursing present removing clothing and placing fan in front of patient as he is feeling overheated after attempting to go to bathroom. SLP to return as patient is able to participate.                                                                                               Novelle Addair M.A., CCC-SLP 10/20/2023, 2:24 PM

## 2023-10-20 NOTE — Progress Notes (Signed)
 [facility] Progress Note   Subjective: Patient seen in HD today. They were able to remove 2L today during HD. BP has been stable. No SOB or CP reported. Patients suffers from residual aphasia but can verbalize short word combinations. Next HD 10/22/23  Objective Vitals:   10/20/23 0936 10/20/23 1002 10/20/23 1034 10/20/23 1125  BP: 105/87 113/76 94/67 125/77  Pulse: 100 (!) 103 100 90  Resp: (!) 22 (!) 26 (!) 22 (!) 22  Temp:    98.7 F (37.1 C)  TempSrc:      SpO2: 97% 96% 99% 98%  Weight:    (S) 128.8 kg  Height:       Physical Exam General: Laying in bed, no distress, obese, cortrak in place Heart: Normal rate, no rub Lungs: Bilateral chest rise with no increased work of breathing Abdomen: soft, distended Extremities: Trace edema in lower extremities, warm and well-perfused Dialysis Access: AVF +t/b  Additional Objective Labs: Basic Metabolic Panel: Recent Labs  Lab 10/17/23 0117 10/18/23 1000 10/20/23 0616  NA 136 135 132*  K 4.3 5.8* 5.1  CL 92* 89* 91*  CO2 24 23 23   GLUCOSE 121* 103* 129*  BUN 103* 139* 117*  CREATININE 14.89* 18.98* 16.05*  CALCIUM  10.0 9.8 9.9  PHOS  --  >30.0* 11.4*     CBC: Recent Labs  Lab 10/14/23 0618 10/15/23 0321 10/17/23 0117 10/18/23 1000 10/20/23 0616  WBC 16.2* 16.0* 12.3* 11.2* 9.8  NEUTROABS  --   --   --   --  6.0  HGB 11.9* 10.7* 11.6* 10.9* 10.6*  HCT 38.2* 33.9* 36.0* 33.9* 33.3*  MCV 100.5* 101.2* 99.7 98.5 99.1  PLT 350 358 363 384 381     Medications:  anticoagulant sodium citrate       amLODipine   10 mg Oral Daily   aspirin   81 mg Oral Daily   atorvastatin   40 mg Oral Daily   carvedilol   12.5 mg Oral BID   Chlorhexidine  Gluconate Cloth  6 each Topical Q0600   Chlorhexidine  Gluconate Cloth  6 each Topical Q0600   clopidogrel   75 mg Oral Daily   docusate  100 mg Oral BID   feeding supplement (OSMOLITE 1.5 CAL)  1,000 mL Per Tube Q24H   free water   30 mL Per Tube Q6H   heparin   injection (subcutaneous)  5,000 Units Subcutaneous Q8H   insulin  aspart  0-6 Units Subcutaneous Q4H   isosorbide  dinitrate  5 mg Oral BID   lanthanum   1,000 mg Oral TID WC   mouth rinse  15 mL Mouth Rinse 4 times per day   pantoprazole  (PROTONIX ) IV  40 mg Intravenous QHS   sodium zirconium cyclosilicate   10 g Oral BID    Dialysis Orders TTS - AF 4:15hr, 500/600, EDW 138kg, 2K/2C bath, AVF, heparin  4000+ - Mircera 50mcg given 10/06/23 - Hectoral 7mcg IV q HD - Takes Auryxia  3/meals and Xphozah 30mg  BID  Assessment/Plan: Acute R MCA CVA: S/p TNK 7/25. Required intubation, now on [place] O2. Ongoing L sided weakness. Per neuro. Working with PT currently. Now in CIR. AHRF, ?aspiration + CVA: Now extubated, on [place] O2. S/p Unasyn  course. WBC normalized.  ESRD: Continue HD on TTS schedule. BUN 117 and Scr 16.05 Hyperkalemia: K 5.1. Had 3 doses of Lokelma  d/t K 5.8. We will continue to monitor HTN/volume: BP decent, occ high - UF as tolerated, keep lowering EDW. Anemia of ESRD: Hgb 10.6 today, no ESA for now. Secondary HPTH: Corrected  Ca 10.4 and Phos 11.4, VDRA on hold. Home auryxia  can not be given via NG tube - change to fosrenol  for now. Nutrition: Alb low, getting TF + protein supplements. Per RD - unable to do Nepro at this point, will change in future.  Nathan Och, NP 10/20/2023, 11:54 AM  [facility]

## 2023-10-20 NOTE — Progress Notes (Addendum)
 Calorie Count Note  48 hour calorie count ordered.  Diet: dysphagia 1 with nectar thick liquids Supplements: magic cup with meals  Also receiving TF via Cortrak: Osmolite 1.5 at 65 ml/h x 12 hours 8pm-8am with free water  flushes 30 ml q6h providing 1170 kcal, 49 gm protein, 594 ml free water  daily (TF + flush = 714 ml free water )  8/6 Breakfast: 705 kcal, 35 gm protein 8/6 Lunch: 693 kcal, 42 gm protein 8/6 Dinner: 727 kcal, 30 gm protein  Total intake: 2125 kcal (92% of minimum estimated needs) 107 gm protein (93% of minimum estimated needs)  Nutrition Dx: Increased nutrient needs related to acute illness as evidenced by estimated needs; ongoing   Goal: Patient will meet greater than or equal to 90% of their needs; met with PO intake of meals and supplements  Weight 8/7: 128.8 kg Weight decreasing with fluid removal from HD  S/P HD this morning, 2 L volume removed; next HD scheduled for 8/9.   LBM 8/6, type 5  Labs and medications reviewed.   Intervention:  D/C Calorie count. Recommend D/C tube feeding and Cortrak. Continue diet per SLP, currently on dysphagia 1 with nectar thick liquids. Magic cup TID with meals, each supplement provides 290 kcal and 9 grams of protein.  Suzen HUNT RD, LDN, CNSC Contact via secure chat. If unavailable, use group chat RD Inpatient.

## 2023-10-20 NOTE — Progress Notes (Addendum)
 Patient ID: Nathan Campbell, male   DOB: 1975-08-16, 48 y.o.   MRN: [id number]  SW received FMLA forms and emailed completed forms to [employer]- [email or web address].  1457- SW left message for pt mother [name] to introduce self, explain role, discuss discharge progress, and ELOS. SW will follow-up with updates after team conference.  1514- SW received return phone call from pt mother and discussed above. She confirms she will be with him at all times as long as needed.   [name] [name], MSW, LCSW Office: [phone] Cell: [phone] Fax: [phone]

## 2023-10-21 LAB — GLUCOSE, CAPILLARY
Glucose-Capillary: 102 mg/dL — ABNORMAL HIGH (ref 70–99)
Glucose-Capillary: 113 mg/dL — ABNORMAL HIGH (ref 70–99)
Glucose-Capillary: 121 mg/dL — ABNORMAL HIGH (ref 70–99)
Glucose-Capillary: 88 mg/dL (ref 70–99)
Glucose-Capillary: 97 mg/dL (ref 70–99)

## 2023-10-21 MED ORDER — FREE WATER
100.0000 mL | Freq: Four times a day (QID) | Status: DC
  Administered 2023-10-21 – 2023-10-22 (×6): 100 mL

## 2023-10-21 MED ORDER — PANTOPRAZOLE SODIUM 40 MG PO TBEC
40.0000 mg | DELAYED_RELEASE_TABLET | Freq: Every day | ORAL | Status: DC
  Administered 2023-10-21 – 2023-11-03 (×17): 40 mg via ORAL
  Filled 2023-10-21 (×15): qty 1

## 2023-10-21 MED ORDER — CHLORHEXIDINE GLUCONATE CLOTH 2 % EX PADS
6.0000 | MEDICATED_PAD | Freq: Every day | CUTANEOUS | Status: DC
  Administered 2023-10-22 – 2023-10-24 (×4): 6 via TOPICAL

## 2023-10-21 NOTE — Progress Notes (Signed)
 [facility] Progress Note   Subjective:    Seen and examined patient at bedside on the rehab unit. Mentation appears to be improving and remains on RA. Denies any acute issues. Tolerated yesterday's HD with net UF 2L. Next HD 8/9.  Objective Vitals:   10/20/23 1533 10/20/23 1928 10/21/23 0410 10/21/23 1415  BP: 104/78 126/80 (!) 148/82 104/72  Pulse: 88 94 94 (!) 101  Resp: 20 19 18 18   Temp: 98.1 F (36.7 C) 98.1 F (36.7 C) 98.1 F (36.7 C) 98 F (36.7 C)  TempSrc:   Oral Oral  SpO2: 98% 96% 95% 96%  Weight:      Height:       Physical Exam General: Laying in bed, no distress, obese, cortrak in place Heart: Normal rate, no rub Lungs: Clear anteriorly; breathing unlabored Abdomen: soft, distended Extremities: Trace edema in lower extremities, warm and well-perfused Dialysis Access: AVF +t/b  Filed Weights   10/20/23 0500 10/20/23 0639 10/20/23 1125  Weight: 128 kg 132.3 kg (S) 128.8 kg    Intake/Output Summary (Last 24 hours) at 10/21/2023 1522 Last data filed at 10/21/2023 1253 Gross per 24 hour  Intake 728 ml  Output --  Net 728 ml    Additional Objective Labs: Basic Metabolic Panel: Recent Labs  Lab 10/18/23 1000 10/20/23 0616 10/20/23 1107  NA 135 132* 133*  K 5.8* 5.1 3.8  CL 89* 91* 91*  CO2 23 23 27   GLUCOSE 103* 129* 152*  BUN 139* 117* 50*  CREATININE 18.98* 16.05* 7.66*  CALCIUM  9.8 9.9 9.9  PHOS >30.0* 11.4* 4.2   Liver Function Tests: Recent Labs  Lab 10/18/23 1000 10/20/23 0616 10/20/23 1107  ALBUMIN 3.3* 3.3* 3.7   No results for input(s): LIPASE, AMYLASE in the last 168 hours. CBC: Recent Labs  Lab 10/15/23 0321 10/17/23 0117 10/18/23 1000 10/20/23 0616  WBC 16.0* 12.3* 11.2* 9.8  NEUTROABS  --   --   --  6.0  HGB 10.7* 11.6* 10.9* 10.6*  HCT 33.9* 36.0* 33.9* 33.3*  MCV 101.2* 99.7 98.5 99.1  PLT 358 363 384 381   Blood Culture    Component Value Date/Time   SDES TRACHEAL ASPIRATE 10/08/2023 0627    SPECREQUEST NONE 10/08/2023 0627   CULT  10/08/2023 0627    Normal respiratory flora-no Staph aureus or Pseudomonas seen Performed at [facility], [address] [place], [place] [address]    REPTSTATUS 10/10/2023 FINAL 10/08/2023 9372    Cardiac Enzymes: No results for input(s): CKTOTAL, CKMB, CKMBINDEX, TROPONINI in the last 168 hours. CBG: Recent Labs  Lab 10/20/23 1613 10/20/23 1956 10/21/23 0040 10/21/23 0738 10/21/23 1127  GLUCAP 117* 95 88 102* 113*   Iron  Studies: No results for input(s): IRON , TIBC, TRANSFERRIN, FERRITIN in the last 72 hours. Lab Results  Component Value Date   INR 1.0 10/07/2023   Studies/Results: CT CHEST WO CONTRAST Result Date: 10/19/2023 CLINICAL DATA:  Pneumonia, complication suspected, xray done EXAM: CT CHEST WITHOUT CONTRAST TECHNIQUE: Multidetector CT imaging of the chest was performed following the standard protocol without IV contrast. RADIATION DOSE REDUCTION: This exam was performed according to the departmental dose-optimization program which includes automated exposure control, adjustment of the mA and/or kV according to patient size and/or use of iterative reconstruction technique. COMPARISON:  Chest x-ray 10/18/2023, chest x-ray 10/12/2023 FINDINGS: Cardiovascular: Normal heart size. No significant pericardial effusion. The thoracic aorta is normal in caliber. No atherosclerotic plaque of the thoracic aorta. No coronary artery  calcifications. Mediastinum/Nodes: No gross hilar adenopathy, noting limited sensitivity for the detection of hilar adenopathy on this noncontrast study. No enlarged mediastinal or axillary lymph nodes. Thyroid gland, trachea, and esophagus demonstrate no significant findings. Lungs/Pleura: No focal consolidation. No pulmonary nodule. No pulmonary mass. No pleural effusion. No pneumothorax. Upper Abdomen: Enteric tube with tip terminating in the lumen of the gastric antrum just proximal to the  pylorus. Left adrenal gland nodule measuring 2.3 cm with a density of -84 Hounsfield units consistent with myelolipoma, no further follow-up indicated. Musculoskeletal: No chest wall abnormality.  Bilateral gynecomastia. No suspicious lytic or blastic osseous lesions. No acute displaced fracture. IMPRESSION: No acute intrathoracic abnormality with limited evaluation on this noncontrast study. Electronically Signed   By: Morgane  Naveau M.D.   On: 10/19/2023 22:38    Medications:   amLODipine   10 mg Oral Daily   aspirin   81 mg Oral Daily   atorvastatin   40 mg Oral Daily   carvedilol   12.5 mg Oral BID   Chlorhexidine  Gluconate Cloth  6 each Topical Q0600   Chlorhexidine  Gluconate Cloth  6 each Topical Q0600   clopidogrel   75 mg Oral Daily   docusate  100 mg Oral BID   free water   100 mL Per Tube Q6H   heparin  injection (subcutaneous)  5,000 Units Subcutaneous Q8H   insulin  aspart  0-6 Units Subcutaneous Q4H   isosorbide  dinitrate  5 mg Oral BID   lanthanum   1,000 mg Oral TID WC   mouth rinse  15 mL Mouth Rinse 4 times per day   pantoprazole   40 mg Oral QHS    Dialysis Orders: TTS - AF 4:15hr, 500/600, EDW 138kg, 2K/2C bath, AVF, heparin  4000+ - Mircera 50mcg given 10/06/23 - Hectoral 7mcg IV q HD - Takes Auryxia  3/meals and Xphozah 30mg  BID  Assessment/Plan: Acute R MCA CVA: S/p TNK 7/25. Required intubation, now on [place] O2. Ongoing L sided weakness. Per neuro. Working with PT currently. Now in CIR. AHRF, ?aspiration + CVA: Now extubated, on [place] O2. S/p Unasyn  course. WBC normalized.  ESRD: Continue HD on TTS schedule. Next HD 8/9. Hyperkalemia: K 5.1. Had 3 doses of Lokelma  d/t K 5.8. We will continue to monitor HTN/volume: BP decent, occ high - UF as tolerated, keep lowering EDW. Anemia of ESRD: Hgb 10.6 today, no ESA for now. Secondary HPTH: Corrected Ca 10.4 and Phos 11.4, VDRA on hold. Home auryxia  can not be given via NG tube - change to fosrenol  for now. Nutrition:  Alb low, getting TF + protein supplements. Per RD - unable to do Nepro at this point, will change in future.  Nathan Piety, NP [facility] 10/21/2023,3:22 PM  LOS: 3 days

## 2023-10-21 NOTE — Progress Notes (Signed)
 Unable to scan consent form due to IT issues. We do have a hard copy of the consent in the patients chart.

## 2023-10-21 NOTE — Progress Notes (Signed)
 Speech Language Pathology Daily Session Note  Patient Details  Name: Nathan Campbell MRN: [id number] Date of Birth: February 26, 1976  Today's Date: 10/21/2023 SLP Individual Time: 9181-9088 SLP Individual Time Calculation (min): 53 min  Short Term Goals: Week 1: SLP Short Term Goal 1 (Week 1): Patient will consume D1/NTL diet with use of compensatory strategies given min multimodal A SLP Short Term Goal 2 (Week 1): Patient will utilize speech intelligibility strategies to reach 75% intelligibility at the word level given mod multimodal A SLP Short Term Goal 3 (Week 1): Patient will demonstrate functional problem solving during daily tasks given mod multimodal A SLP Short Term Goal 4 (Week 1): Patient will recall and utilize memory compensatory aids given mod multimodal A SLP Short Term Goal 5 (Week 1): Patient will demonstrate awareness of cognitive and physical changes since admission given mod multimodal A  Skilled Therapeutic Interventions: SLP conducted skilled therapy session targeting cognitive-linguistic goals. SLP engaged in conversation with patient and patient benefited from mod assist to facilitate conversational flow and conveying responses due to reduced intelligibility from dysarthria. SLP introduced SLOP speech intelligibility strategies and patient benefited from max assist to implement at the word level. Patient most benefited from cues to refrain from word and syllable collapse and to slow rate during 2- and 3- syllable words. SLP then targeted sustained attention, L inattention, and impulsivity via card sorting task. Patient required max cues to reduce impulsivity, to sustain attention to task, and to attend to the L side. At end of session, patient endorsed need to use the bathroom, thus SLP prepared for transfer per safety plan, however prior to standing patient endorsed dizziness thus NT notified and asked to provide additional support. Patient left with nursing staff at end of session.  SLP will continue to target goals per plan of care.        Pain  None endorsed  Therapy/Group: Individual Therapy  Rosina Downy, M.A., CCC-SLP  Abdullah Rizzi A Jeremie Abdelaziz 10/21/2023, 9:13 AM

## 2023-10-21 NOTE — Progress Notes (Signed)
 Physical Therapy Session Note  Patient Details  Name: Nathan Campbell MRN: [id number] Date of Birth: [birth year]  Today's Date: 10/21/2023 PT Individual Time: 1520-1550 PT Individual Time Calculation (min): 30 min   Short Term Goals: Week 1:  PT Short Term Goal 1 (Week 1): Pt will demosntrate bed mobility with CGA and good technique. PT Short Term Goal 2 (Week 1): pt will perform standing transfers with consistent SBA/ CGA. PT Short Term Goal 3 (Week 1): Pt will perform ambulation up to 100 ft using LRAD with CGA. PT Short Term Goal 4 (Week 1): Pt will demonstrate stair navigation up /down at least 8 steps with overall MinA. PT Short Term Goal 5 (Week 1): Pt will complete appropriate outcome measure.  Skilled Therapeutic Interventions/Progress Updates:    Pt in w/c when PT arrived, just finished bathing and heparin  flush. Pt appears lethargic, agreeable for PT session.  PT wheeled pt towards gym when pt starts to fall asleep and hyper extending his neck, unable to keep eyes open, however, easily awaken with verbal/tactile stimuli.  BP 121/66. Pt coughing with clear, thick sputum production during visit.  Pt denies pain, discomfort.  PT wheeled pt back into room for safety measures.  Pt transferred from w/c->bed with CGA, 180 degree right turn to sit onto EOB. Pt requires LUE positioning during transfer. Pt able to position RLE onto bed with min assist, somewhat impulsive as he thrusts into bed ending in a diagonal position. PT to position trunk and LE as much as possible.  Pt able to perform bridge, unable to control placement. PT unable to position hips, attempted using chucks to a semi straighten position. Pt reports he's comfortable.  In supine, with HOB elevated, pt performed heel slides x15, isom hip add with pillow x20, bridging x15; pt requires mod cueing to stay on task due to fatigue. Pt in bed with call bell, bed alarm activated, cell phone.   Therapy Documentation Precautions:   Precautions Precautions: Fall Precaution/Restrictions Comments: L hemipareisis UE>LE, L hemianopsia, cortrak, watch SpO2 and RR Restrictions Weight Bearing Restrictions Per Provider Order: No  Pain: 0/10     Therapy/Group: Individual Therapy  Arland GORMAN Fast 10/21/2023, 4:02 PM

## 2023-10-21 NOTE — Progress Notes (Addendum)
 Occupational Therapy Session Note  Patient Details  Name: Nathan Campbell MRN: [id number] Date of Birth: 1975-08-28  Today's Date: 10/21/2023 OT Individual Time: 9054-8964 OT Individual Time Calculation (min): 50 min (missed 10 min due to fatigue)   Short Term Goals: Week 1:  OT Short Term Goal 1 (Week 1): Pt will be able to don shirt with min A. OT Short Term Goal 2 (Week 1): Pt will be able to don pants over feet with min A. OT Short Term Goal 3 (Week 1): pt will be able to perform self ROM of LUE. OT Short Term Goal 4 (Week 1): Pt will be able to bathe with min A using long handled sponge.  Skilled Therapeutic Interventions/Progress Updates:    Pt received in bed requesting a shower.  He is now on 2L of O2.  While getting supplies for shower and to cover his IV had pt on room air to see what his sats would be.  Pt only at 91-92% so replaced O2. On 2L pt at 100%.  Pt having great difficulty rolling to his R to get out of bed but with cues on how to manage his LUE and how to roll shoulder using core strength, pt then able to sit up with supervision with HOB at 45 degrees.   Pt sat up and but was demonstrating lethargy as his eyes kept rolling back and pt had strong R eye gaze preference.  Had pt rise to stand to see if that would help him become more alert. Pt needed min A to stand and then stated he was woozy. Returned to sit to check BP 132/72.   Had pt rest. Doffed shirt with min A with thought pt could just do a stand pivot to wc vs walking to bathroom. Once shirt off and IV covered, pt becoming more somnolent. Told pt it was not safe to try to shower at this time as his energy level was too low.  Redonned  shirt with mod A. Pt then moved  Back to supine with max A.  Adjusted in bed with pt helping to scoot himself up in the bed.  Pt suddenly c/o itching under neck and on chest and asked for antitch lotion, helped him to apply it.   Today pt was able to pull his L arm back with even  moreROM.   Once in bed had pt work on active tricep extension with resistance and a/arom elb flexion. He is making good gains with movement in L arm despite L neglect and L visual field cut and inattention to L environment.  Even with active movement pt kept dozing off so stopped session early.   Pt in bed with all needs met, alarm set.     Therapy Documentation Precautions:  Precautions Precautions: Fall Precaution/Restrictions Comments: L hemipareisis UE>LE, L hemianopsia, cortrak, watch SpO2 and RR Restrictions Weight Bearing Restrictions Per Provider Order: No   Pain: no c/o pain    ADL: ADL Eating: Supervision/safety Grooming: Supervision/safety Upper Body Bathing: Moderate assistance Lower Body Bathing: Maximal assistance Upper Body Dressing: Maximal assistance Lower Body Dressing: Maximal assistance Toileting: Maximal assistance Where Assessed-Toileting: Teacher, adult education: Curator Method: Proofreader: Grab bars   Therapy/Group: Individual Therapy  Nathan Campbell 10/21/2023, 11:59 AM

## 2023-10-21 NOTE — IPOC Note (Signed)
 Overall Plan of Care [facility]) Patient Details Name: Nathan Campbell MRN: [id number] DOB: 02-23-1976  Admitting Diagnosis: Right MCA infarct   Hospital Problems: Active Problems:   * No active hospital problems. *     Functional Problem List: Nursing Safety, Bowel, Endurance, Medication Management, Pain  PT Balance, Endurance, Motor, Perception, Safety, Sensory  OT Balance, Cognition, Endurance, Motor, Safety, Perception, Vision  SLP Cognition, Linguistic, Nutrition  TR         Basic ADL's: OT Grooming, Bathing, Dressing, Toileting     Advanced  ADL's: OT None     Transfers: PT Bed Mobility, Bed to Chair, Car, [employer], Floor  OT Toilet, Tub/Shower     Locomotion: PT Ambulation, Stairs     Additional Impairments: OT Fuctional Use of Upper Extremity  SLP Swallowing, Social Cognition   Problem Solving, Attention, Memory, Awareness  TR      Anticipated Outcomes Item Anticipated Outcome  Self Feeding independent  Swallowing  supervisionA   Basic self-care  supervision  Toileting  supervision   Bathroom Transfers supervision for toilet, CGA for shower  Bowel/Bladder  manage bowel w mod I assist  Transfers  supervision  Locomotion  supervision  Communication  minA  Cognition  minA  Pain  Pain < 4 with prns  Safety/Judgment  Manage safety w cues   Therapy Plan: PT Intensity: Minimum of 1-2 x/day ,45 to 90 minutes PT Frequency: 5 out of 7 days PT Duration Estimated Length of Stay: 17-21 days OT Intensity: Minimum of 1-2 x/day, 45 to 90 minutes OT Frequency: 5 out of 7 days OT Duration/Estimated Length of Stay: 20-21 days SLP Intensity: Minumum of 1-2 x/day, 30 to 90 minutes SLP Frequency: 3 to 5 out of 7 days SLP Duration/Estimated Length of Stay: 20-21 days   Team Interventions: Nursing Interventions Bowel Management, Medication Management, Patient/Family Education, Disease Management/Prevention, Discharge Planning, Pain Management  PT  interventions Ambulation/gait training, Cognitive remediation/compensation, Discharge planning, DME/adaptive equipment instruction, Functional mobility training, Pain management, Psychosocial support, Splinting/orthotics, Therapeutic Activities, UE/LE Strength taining/ROM, Visual/perceptual remediation/compensation, Warden/ranger, Community reintegration, Disease management/prevention, Development worker, international aid stimulation, Neuromuscular re-education, Patient/family education, Skin care/wound management, Stair training, Therapeutic Exercise, UE/LE Coordination activities, Wheelchair propulsion/positioning  OT Interventions Warden/ranger, Cognitive remediation/compensation, DME/adaptive equipment instruction, Discharge planning, Functional mobility training, Pain management, Patient/family education, Neuromuscular re-education, Psychosocial support, Self Care/advanced ADL retraining, Therapeutic Exercise, Therapeutic Activities, UE/LE Strength taining/ROM, Splinting/orthotics, Visual/perceptual remediation/compensation, UE/LE Coordination activities  SLP Interventions Cognitive remediation/compensation, Cueing hierarchy, DME/adaptive equipment instruction, Dysphagia/aspiration precaution training, Functional tasks, Internal/external aids, Multimodal communication approach, Oral motor exercises, Patient/family education, Speech/Language facilitation, Therapeutic Activities  TR Interventions    SW/CM Interventions Discharge Planning, Psychosocial Support, Patient/Family Education   Barriers to Discharge MD  Medical stability, Weight, and Hemodialysis  Nursing Decreased caregiver support, Home environment access/layout, Hemodialysis 1 level 2nd floor apt flight of steps to entry bil rails solo; mom to assist; HD (T, H, Sa)  PT Inaccessible home environment, Decreased caregiver support, Insurance for SNF coverage, Weight, Hemodialysis    OT Inaccessible home environment full flight of  stairs to enter apartment  SLP      SW Decreased caregiver support, Lack of/limited family support, Community education officer for SNF coverage     Team Discharge Planning: Destination: PT-Home ,OT- Home , SLP-Home Projected Follow-up: PT-Home health PT, Outpatient PT, 24 hour supervision/assistance, OT-  Outpatient OT, SLP-Home Health SLP, Outpatient SLP, 24 hour supervision/assistance Projected Equipment Needs: PT-To be determined, OT- To be determined, SLP-None recommended by SLP Equipment Details: PT- ,  OT-  Patient/family involved in discharge planning: PT- Patient,  OT-Patient, SLP-Patient  MD ELOS: 20-21d Medical Rehab Prognosis:  Good Assessment: The patient has been admitted for CIR therapies with the diagnosis of Right MCA infarct. The team will be addressing functional mobility, strength, stamina, balance, safety, adaptive techniques and equipment, self-care, bowel and bladder mgt, patient and caregiver education, Orthostatic hypotension in HD pt . Goals have been set at Mod I. Anticipated discharge destination is Home .        See Team Conference Notes for weekly updates to the plan of care

## 2023-10-21 NOTE — Progress Notes (Signed)
 PROGRESS NOTE   Subjective/Complaints:  CT chest reviewed , labs reviewed , appreciate nephro assist  Vasovagal episode after HD yesterday , reportedly had 2L removed  Intake improved now off TF , po fluids low  Discussed medication po intake with nsg doing ok , fluids intake ~223ml yesterday , is on restriction   ROS-denies CP, SOB, N/V/D  Objective:   CT CHEST WO CONTRAST Result Date: 10/19/2023 CLINICAL DATA:  Pneumonia, complication suspected, xray done EXAM: CT CHEST WITHOUT CONTRAST TECHNIQUE: Multidetector CT imaging of the chest was performed following the standard protocol without IV contrast. RADIATION DOSE REDUCTION: This exam was performed according to the departmental dose-optimization program which includes automated exposure control, adjustment of the mA and/or kV according to patient size and/or use of iterative reconstruction technique. COMPARISON:  Chest x-ray 10/18/2023, chest x-ray 10/12/2023 FINDINGS: Cardiovascular: Normal heart size. No significant pericardial effusion. The thoracic aorta is normal in caliber. No atherosclerotic plaque of the thoracic aorta. No coronary artery calcifications. Mediastinum/Nodes: No gross hilar adenopathy, noting limited sensitivity for the detection of hilar adenopathy on this noncontrast study. No enlarged mediastinal or axillary lymph nodes. Thyroid gland, trachea, and esophagus demonstrate no significant findings. Lungs/Pleura: No focal consolidation. No pulmonary nodule. No pulmonary mass. No pleural effusion. No pneumothorax. Upper Abdomen: Enteric tube with tip terminating in the lumen of the gastric antrum just proximal to the pylorus. Left adrenal gland nodule measuring 2.3 cm with a density of -84 Hounsfield units consistent with myelolipoma, no further follow-up indicated. Musculoskeletal: No chest wall abnormality.  Bilateral gynecomastia. No suspicious lytic or blastic  osseous lesions. No acute displaced fracture. IMPRESSION: No acute intrathoracic abnormality with limited evaluation on this noncontrast study. Electronically Signed   By: Morgane  Naveau M.D.   On: 10/19/2023 22:38   Recent Labs    10/20/23 0616  WBC 9.8  HGB 10.6*  HCT 33.3*  PLT 381   Recent Labs    10/20/23 0616 10/20/23 1107  NA 132* 133*  K 5.1 3.8  CL 91* 91*  CO2 23 27  GLUCOSE 129* 152*  BUN 117* 50*  CREATININE 16.05* 7.66*  CALCIUM  9.9 9.9    Intake/Output Summary (Last 24 hours) at 10/21/2023 1022 Last data filed at 10/21/2023 0803 Gross per 24 hour  Intake 538 ml  Output 2000 ml  Net -1462 ml        Physical Exam: Vital Signs Blood pressure (!) 148/82, pulse 94, temperature 98.1 F (36.7 C), temperature source Oral, resp. rate 18, height 6' (1.829 m), weight (S) 128.8 kg, SpO2 95%.   General: No acute distress Mood and affect are appropriate Heart: Regular rate and rhythm no rubs murmurs or extra sounds Lungs: Clear to auscultation, breathing unlabored, no rales or wheezes Abdomen: Positive bowel sounds, soft nontender to palpation, nondistended Extremities: No clubbing, cyanosis, or edema Skin: No evidence of breakdown, no evidence of rash Neurologic: Cranial nerves II through XII intact, motor strength is 5/5 in right deltoid, bicep, tricep, grip, hip flexor, knee extensors, ankle dorsiflexor and plantar flexor 2-/5 in LUE at delt and biceps and grip, 0/5 L finger and arm extension , 4- LLE muscle groups as above  Sensory exam normal sensation to light touch and proprioception in bilateral upper and lower extremities Cerebellar exam not tested on LUE due to weakness Musculoskeletal: pain free  range of motion in all 4 extremities. No joint swelling   Assessment/Plan: 1. Functional deficits which require 3+ hours per day of interdisciplinary therapy in a comprehensive inpatient rehab setting. Physiatrist is providing close team supervision and 24 hour  management of active medical problems listed below. Physiatrist and rehab team continue to assess barriers to discharge/monitor patient progress toward functional and medical goals  Care Tool:  Bathing    Body parts bathed by patient: Chest, Abdomen, Front perineal area, Right upper leg, Left upper leg, Face   Body parts bathed by helper: Right arm, Left arm, Buttocks, Right lower leg, Left lower leg     Bathing assist Assist Level: Maximal Assistance - Patient 24 - 49%     Upper Body Dressing/Undressing Upper body dressing   What is the patient wearing?: Pull over shirt    Upper body assist Assist Level: Maximal Assistance - Patient 25 - 49%    Lower Body Dressing/Undressing Lower body dressing      What is the patient wearing?: Pants, Incontinence brief     Lower body assist Assist for lower body dressing: Maximal Assistance - Patient 25 - 49%     Toileting Toileting Toileting Activity did not occur (Clothing management and hygiene only): N/A (no void or bm) (dialysis patient)  Toileting assist Assist for toileting: Maximal Assistance - Patient 25 - 49%     Transfers Chair/bed transfer  Transfers assist     Chair/bed transfer assist level: Minimal Assistance - Patient > 75%     Locomotion Ambulation   Ambulation assist      Assist level: Minimal Assistance - Patient > 75% Assistive device: Cane-quad Max distance: 35 ft   Walk 10 feet activity   Assist     Assist level: Minimal Assistance - Patient > 75% Assistive device: Cane-quad   Walk 50 feet activity   Assist Walk 50 feet with 2 turns activity did not occur: Safety/medical concerns         Walk 150 feet activity   Assist Walk 150 feet activity did not occur: Safety/medical concerns         Walk 10 feet on uneven surface  activity   Assist Walk 10 feet on uneven surfaces activity did not occur: Safety/medical concerns         Wheelchair     Assist Is the patient  using a wheelchair?: Yes Type of Wheelchair: Manual    Wheelchair assist level: Dependent - Patient 0% Max wheelchair distance: 300 ft    Wheelchair 50 feet with 2 turns activity    Assist        Assist Level: Dependent - Patient 0%   Wheelchair 150 feet activity     Assist      Assist Level: Dependent - Patient 0%   Blood pressure (!) 148/82, pulse 94, temperature 98.1 F (36.7 C), temperature source Oral, resp. rate 18, height 6' (1.829 m), weight (S) 128.8 kg, SpO2 95%.  Medical Problem List and Plan: 1. Functional deficits secondary to acute R MCA CVA with right M1 occlusion s/p TNK 7/25 and IR with TICI2b likely large vessel disease.             -patient may shower             -ELOS/Goals: 10-14 days, supervision             -  Admit to CIR 2.  Antithrombotics: -DVT/anticoagulation:  Pharmaceutical: Heparin              -antiplatelet therapy: Aspirin  and Plavix  x 3 months then Aspirin  alone.  3. Pain Management: Tylenol  and Flexeril  (back pain) as needed 4. Mood/Behavior/Sleep: Therapy evaluations completed due to patient decreased functional mobility was admitted for a comprehensive rehab program.              -antipsychotic agents: N/A 5. Neuropsych/cognition: This patient is not capable of making decisions on his own behalf. 6. Skin/Wound Care: Routine pressure relief measures 7. Fluids/Electrolytes/Nutrition: Monitor I/O and Daily weights.              - TF + protein supplements.  Dysphagia - upgraded to dysphagia 1 diet -TF d/ced May d/c Cortrak 8/9 if po fluid intake >1090ml 8. ESRD on hemodialysis: BUN 139, Cr 18.98 HD on TTHS schedule- --Labs to be collected in HD             -Hyperphos/Hyperkalemia Phos > 30, K 5.8- asking Nephro whether pt needs lokelma  today or if labs were drawn pre HD 9. Chronic Anemia of ESRD: hgb 10.9 from 11.6 continue to monitor  10. Acute hypoxic/hypercapnic respiratory failure: On 2 L [place] CXR Vague lingular and left lower lobe  airspace opacification   Repeat CXR done 8/5 pending.  Suspect underlying OSA will check overnite oximetry  11. Leukocytosis: WBC 16.0-12.3-11.2, afebrile see above  12. Combined systolic and diastolic HF/HTN: home meds amlodipine , isosorbide  and carvedilol .              - Monitor for orthostasis with activity.  13. Secondary HPTH: Home auryxia  cannot be given via NG tube--continue Fosrenol   14. HLD: LDL 85 Atorvastatin  40 mg  15. Obesity: BMI 39.77 educate on diet and weight loss to promote overall health and mobility.  16.  Hypertension: Long-term goal normotensive.  Continue Norvasc , Coreg , isosorbide   Vitals:   10/20/23 1928 10/21/23 0410  BP: 126/80 (!) 148/82  Pulse: 94 94  Resp: 19 18  Temp: 98.1 F (36.7 C) 98.1 F (36.7 C)  SpO2: 96% 95%   Increase coreg  to 12.5 still hypertensive with elevated HR 17.  Hyperlipidemia: Continue statin    18.  Vasovagal episode due to HD (2L off yesterday ) as well as reduce fluid intake po and minmal TF volumes, no off TF, will increase free H20 today and enc po fluid intake to >1057ml , Nephro managing HD which will be tomorrow 8/9  LOS: 3 days A FACE TO FACE EVALUATION WAS PERFORMED  Prentice FORBES Compton 10/21/2023, 10:22 AM

## 2023-10-21 NOTE — Progress Notes (Signed)
 Physical Therapy Session Note  Patient Details  Name: Nathan Campbell MRN: [id number] Date of Birth: 15-Dec-1975  Today's Date: 10/21/2023 PT Individual Time: 8691-8581 PT Individual Time Calculation (min): 70 min   Short Term Goals: Week 1:  PT Short Term Goal 1 (Week 1): Pt will demosntrate bed mobility with CGA and good technique. PT Short Term Goal 2 (Week 1): pt will perform standing transfers with consistent SBA/ CGA. PT Short Term Goal 3 (Week 1): Pt will perform ambulation up to 100 ft using LRAD with CGA. PT Short Term Goal 4 (Week 1): Pt will demonstrate stair navigation up /down at least 8 steps with overall MinA. PT Short Term Goal 5 (Week 1): Pt will complete appropriate outcome measure.  Skilled Therapeutic Interventions/Progress Updates: Patient supine in bed with mother present on entrance to room. Patient alert and agreeable to PT session.   Patient reported no pain. Session focused on building pt/family rapport, outcome measures, education regarding pt's current presentation that include family and pt to increase awareness to L side (stay on L of pt to increase attention to affected side).  Therapeutic Activity: Bed Mobility: Pt performed supine<sit on EOB with min/modA due to pt's positioning low in bed (HOB elevated). Transfers: Pt performed sit<>stand transfers throughout session with R LBQC and overall CGA. Provided VC for hand placement and to control descent (minA to control).  5xSTS (avg = 53.01s) with R LBQC and CGA to stand, and minA to control descent. Pt with decreased recall to reach back to arm rest throughout  - 38.27s  - 60.40s (pt required increased time to perform due to less cuing to stand and sit and moderately  distracting environment)  - 61.25s (pt stopped briefly to scratch chest and reported this has been a thing with medical team aware).   Gait Training:  Pt ambulated 32' x 1, and 10' x 1 (needed seated rest due to fatigue as pt performed static  stance NMRE prior to advancing) using R LBQC with overall minA/heavy minA. Pt demonstrated the following gait deviations with therapist providing the described cuing and facilitation for improvement:  - Pt with narrow BOS with cues to increase (good adherence to coordinating steps to maintain as much as possible following cue). - Decreased weight shift B sides with manual facilitation required to assist accordingly with VC - Cues for cane sequence in R UE - step to pattern at first that improved weight shift  Patient sitting in WC at end of session with brakes locked, mother present (mother expressed wanting to change pt's shirt. PTA stated to ask nsg following to donn security alarm belt) and all needs within reach.      Therapy Documentation Precautions:  Precautions Precautions: Fall Precaution/Restrictions Comments: L hemipareisis UE>LE, L hemianopsia, cortrak, watch SpO2 and RR Restrictions Weight Bearing Restrictions Per Provider Order: No   Therapy/Group: Individual Therapy  Charika Mikelson PTA 10/21/2023, 3:36 PM

## 2023-10-22 ENCOUNTER — Other Ambulatory Visit (HOSPITAL_COMMUNITY): Payer: Self-pay

## 2023-10-22 DIAGNOSIS — L299 Pruritus, unspecified: Secondary | ICD-10-CM

## 2023-10-22 DIAGNOSIS — I639 Cerebral infarction, unspecified: Secondary | ICD-10-CM

## 2023-10-22 LAB — CBC WITH DIFFERENTIAL/PLATELET
Abs Immature Granulocytes: 0.08 K/uL — ABNORMAL HIGH (ref 0.00–0.07)
Basophils Absolute: 0 K/uL (ref 0.0–0.1)
Basophils Relative: 0 %
Eosinophils Absolute: 0.9 K/uL — ABNORMAL HIGH (ref 0.0–0.5)
Eosinophils Relative: 10 %
HCT: 34 % — ABNORMAL LOW (ref 39.0–52.0)
Hemoglobin: 11.1 g/dL — ABNORMAL LOW (ref 13.0–17.0)
Immature Granulocytes: 1 %
Lymphocytes Relative: 19 %
Lymphs Abs: 1.7 K/uL (ref 0.7–4.0)
MCH: 32 pg (ref 26.0–34.0)
MCHC: 32.6 g/dL (ref 30.0–36.0)
MCV: 98 fL (ref 80.0–100.0)
Monocytes Absolute: 1.4 K/uL — ABNORMAL HIGH (ref 0.1–1.0)
Monocytes Relative: 15 %
Neutro Abs: 5 K/uL (ref 1.7–7.7)
Neutrophils Relative %: 55 %
Platelets: 353 K/uL (ref 150–400)
RBC: 3.47 MIL/uL — ABNORMAL LOW (ref 4.22–5.81)
RDW: 13.2 % (ref 11.5–15.5)
WBC: 9.1 K/uL (ref 4.0–10.5)
nRBC: 0 % (ref 0.0–0.2)

## 2023-10-22 LAB — RENAL FUNCTION PANEL
Albumin: 3.5 g/dL (ref 3.5–5.0)
Anion gap: 19 — ABNORMAL HIGH (ref 5–15)
BUN: 102 mg/dL — ABNORMAL HIGH (ref 6–20)
CO2: 24 mmol/L (ref 22–32)
Calcium: 10.1 mg/dL (ref 8.9–10.3)
Chloride: 90 mmol/L — ABNORMAL LOW (ref 98–111)
Creatinine, Ser: 15.03 mg/dL — ABNORMAL HIGH (ref 0.61–1.24)
GFR, Estimated: 4 mL/min — ABNORMAL LOW (ref >60)
Glucose, Bld: 106 mg/dL — ABNORMAL HIGH (ref 70–99)
Phosphorus: 9.8 mg/dL — ABNORMAL HIGH (ref 2.5–4.6)
Potassium: 5.2 mmol/L — ABNORMAL HIGH (ref 3.5–5.1)
Sodium: 133 mmol/L — ABNORMAL LOW (ref 135–145)

## 2023-10-22 LAB — GLUCOSE, CAPILLARY
Glucose-Capillary: 103 mg/dL — ABNORMAL HIGH (ref 70–99)
Glucose-Capillary: 107 mg/dL — ABNORMAL HIGH (ref 70–99)
Glucose-Capillary: 130 mg/dL — ABNORMAL HIGH (ref 70–99)
Glucose-Capillary: 87 mg/dL (ref 70–99)
Glucose-Capillary: 90 mg/dL (ref 70–99)

## 2023-10-22 NOTE — Progress Notes (Signed)
 Physical Therapy Session Note  Patient Details  Name: Nathan Campbell MRN: [id number] Date of Birth: 12/06/1975  Today's Date: 10/22/2023 PT Individual Time: 0900-0950 PT Individual Time Calculation (min): 50 min   Short Term Goals: Week 1:  PT Short Term Goal 1 (Week 1): Pt will demosntrate bed mobility with CGA and good technique. PT Short Term Goal 2 (Week 1): pt will perform standing transfers with consistent SBA/ CGA. PT Short Term Goal 3 (Week 1): Pt will perform ambulation up to 100 ft using LRAD with CGA. PT Short Term Goal 4 (Week 1): Pt will demonstrate stair navigation up /down at least 8 steps with overall MinA. PT Short Term Goal 5 (Week 1): Pt will complete appropriate outcome measure.  Skilled Therapeutic Interventions/Progress Updates: Patient sitting in [facility] with nsg assisting with late breakfast (pt missed time due to) on entrance to room. Patient alert and agreeable to PT session.   Patient reported no pain during session, except for wanting NSG tube taken out due to uncomfortability and feeling like it is preventing pt from receiving adequate sleep  Therapeutic Activity: Transfers: Pt performed sit<>stand transfers throughout session with CGA and R LBQC. Provided VC for controlled descent without R UE in order to improve quadriceps control eccentrically.  Gait Training:  Pt ambulated roughly 87' with 90* and 180* turns using R LBQC with overall minA. Pt demonstrated the following gait deviations with therapist providing the described cuing and facilitation for improvement:  - Pt performed following weight shift NMRE with noted improvement from previous day of weight shifting to contralateral LE in order to advance stepping LE. Pt still required multimodal cuing for sequence with cane and to increase step length/width  Pt ambulated 60' without AD and minA with increased lean to L with VC to shift weight more to R vs L. Pt with improved step pattern. Pt ambulated 2nd round  21' (and then 3rd round of 142') without AD and VC to increase awareness to L side per R gaze preference with head in rotation to that direction. Pt with noted improvement with decreased L lean when increasing awareness to L side, and able to bring weight closer to center vs when pt keeps gaze to R (still minA).   Neuromuscular Re-ed: NMR facilitated during session with focus on static standing balance, weight shift and proprioceptive feedback and attention to L/center. - 2 rounds of static standing balance without UE support and pt educated/cued to perform ankle strategies to maintain standing balance. Pt with CGA/close supervision with brief moments of light minA to prevent LOB with pt cued to shift weight to opposite side when that occurs with pt improving ability to do so. PTA then provided perturbations with pt reacting accordingly with no LOB. Pt also cued to maintain forward gaze or L gaze throughout.   NMR performed for improvements in motor control and coordination, balance, sequencing, judgement, and self confidence/ efficacy in performing all aspects of mobility at highest level of independence.   Patient sitting in WC at end of session with brakes locked, nsg assisting back to bed due to pt's lethargic presentation and inability to safely hold neck up in WC.      Therapy Documentation Precautions:  Precautions Precautions: Fall Precaution/Restrictions Comments: L hemipareisis UE>LE, L hemianopsia, cortrak, watch SpO2 and RR Restrictions Weight Bearing Restrictions Per Provider Order: No  Therapy/Group: Individual Therapy  Yong Grieser PTA 10/22/2023, 12:25 PM

## 2023-10-22 NOTE — Plan of Care (Signed)
  Problem: Consults Goal: RH STROKE PATIENT EDUCATION Description: See Patient Education module for education specifics  Outcome: Progressing   Problem: RH BOWEL ELIMINATION Goal: RH STG MANAGE BOWEL WITH ASSISTANCE Description: STG Manage Bowel with mod I Assistance. Outcome: Progressing Goal: RH STG MANAGE BOWEL W/MEDICATION W/ASSISTANCE Description: STG Manage Bowel with Medication with mod I Assistance. Outcome: Progressing   Problem: RH SAFETY Goal: RH STG ADHERE TO SAFETY PRECAUTIONS W/ASSISTANCE/DEVICE Description: STG Adhere to Safety Precautions With cues Assistance/Device. Outcome: Progressing   Problem: RH PAIN MANAGEMENT Goal: RH STG PAIN MANAGED AT OR BELOW PT'S PAIN GOAL Description: Pain < 4 with prns Outcome: Progressing   Problem: RH KNOWLEDGE DEFICIT Goal: RH STG INCREASE KNOWLEDGE OF HYPERTENSION Description: Patient and family will be able to manage HTN using educational resources for medications and dietary modification independently Outcome: Progressing Goal: RH STG INCREASE KNOWLEDGE OF DYSPHAGIA/FLUID INTAKE Description: Patient and family will be able to manage dysphagia using educational resources for medications and dietary modification independently Outcome: Progressing Goal: RH STG INCREASE KNOWLEGDE OF HYPERLIPIDEMIA Description: Patient and family will be able to manage HLD using educational resources for medications and dietary modification independently Outcome: Progressing Goal: RH STG INCREASE KNOWLEDGE OF STROKE PROPHYLAXIS Description: Patient and family will be able to manage secondary risks using educational resources for medications and dietary modification independently Outcome: Progressing

## 2023-10-22 NOTE — Progress Notes (Signed)
 PROGRESS NOTE   Subjective/Complaints: Patient seen in dialysis today.  Reports itching in several areas of his body.  Patient would like cortrak discontinued.  ROS-denies CP, SOB, N/V/D, abdominal pain + itch  Objective:   No results found.  Recent Labs    10/20/23 0616 10/22/23 0500  WBC 9.8 9.1  HGB 10.6* 11.1*  HCT 33.3* 34.0*  PLT 381 353   Recent Labs    10/20/23 1107 10/22/23 0500  NA 133* 133*  K 3.8 5.2*  CL 91* 90*  CO2 27 24  GLUCOSE 152* 106*  BUN 50* 102*  CREATININE 7.66* 15.03*  CALCIUM  9.9 10.1    Intake/Output Summary (Last 24 hours) at 10/22/2023 1446 Last data filed at 10/22/2023 1225 Gross per 24 hour  Intake 1160 ml  Output --  Net 1160 ml        Physical Exam: Vital Signs Blood pressure (!) 166/73, pulse 91, temperature 97.7 F (36.5 C), resp. rate (!) 28, height 6' (1.829 m), weight 133.1 kg, SpO2 (!) 89%.   General: No acute distress, laying in bed on dialysis Mood and affect are appropriate Heart: Regular rate and rhythm no rubs murmurs or extra sounds Lungs: Clear to auscultation, breathing unlabored, no rales or wheezes Abdomen: Positive bowel sounds, soft nontender to palpation, nondistended Extremities: No clubbing, cyanosis, or edema Skin: No evidence of breakdown, no evidence of rash Neurologic: Cranial nerves II through XII grossly intact, follows simple commands  MSK No joint swelling noted  Prior Exam motor strength is 5/5 in right deltoid, bicep, tricep, grip, hip flexor, knee extensors, ankle dorsiflexor and plantar flexor 2-/5 in LUE at delt and biceps and grip, 0/5 L finger and arm extension , 4- LLE muscle groups as above  Sensory exam normal sensation to light touch and proprioception in bilateral upper and lower extremities Cerebellar exam not tested on LUE due to weakness Musculoskeletal: pain free  range of motion in all 4 extremities. No joint  swelling   Assessment/Plan: 1. Functional deficits which require 3+ hours per day of interdisciplinary therapy in a comprehensive inpatient rehab setting. Physiatrist is providing close team supervision and 24 hour management of active medical problems listed below. Physiatrist and rehab team continue to assess barriers to discharge/monitor patient progress toward functional and medical goals  Care Tool:  Bathing    Body parts bathed by patient: Chest, Abdomen, Front perineal area, Right upper leg, Left upper leg, Face   Body parts bathed by helper: Right arm, Left arm, Buttocks, Right lower leg, Left lower leg     Bathing assist Assist Level: Maximal Assistance - Patient 24 - 49%     Upper Body Dressing/Undressing Upper body dressing   What is the patient wearing?: Pull over shirt    Upper body assist Assist Level: Maximal Assistance - Patient 25 - 49%    Lower Body Dressing/Undressing Lower body dressing      What is the patient wearing?: Pants, Incontinence brief     Lower body assist Assist for lower body dressing: Maximal Assistance - Patient 25 - 49%     Toileting Toileting Toileting Activity did not occur (Clothing management and hygiene only): N/A (  no void or bm) (dialysis patient)  Toileting assist Assist for toileting: Maximal Assistance - Patient 25 - 49%     Transfers Chair/bed transfer  Transfers assist     Chair/bed transfer assist level: Minimal Assistance - Patient > 75%     Locomotion Ambulation   Ambulation assist      Assist level: Minimal Assistance - Patient > 75% Assistive device: Cane-quad Max distance: 35 ft   Walk 10 feet activity   Assist     Assist level: Minimal Assistance - Patient > 75% Assistive device: Cane-quad   Walk 50 feet activity   Assist Walk 50 feet with 2 turns activity did not occur: Safety/medical concerns         Walk 150 feet activity   Assist Walk 150 feet activity did not occur:  Safety/medical concerns         Walk 10 feet on uneven surface  activity   Assist Walk 10 feet on uneven surfaces activity did not occur: Safety/medical concerns         Wheelchair     Assist Is the patient using a wheelchair?: Yes Type of Wheelchair: Manual    Wheelchair assist level: Dependent - Patient 0% Max wheelchair distance: 300 ft    Wheelchair 50 feet with 2 turns activity    Assist        Assist Level: Dependent - Patient 0%   Wheelchair 150 feet activity     Assist      Assist Level: Dependent - Patient 0%   Blood pressure (!) 166/73, pulse 91, temperature 97.7 F (36.5 C), resp. rate (!) 28, height 6' (1.829 m), weight 133.1 kg, SpO2 (!) 89%.  Medical Problem List and Plan: 1. Functional deficits secondary to acute R MCA CVA with right M1 occlusion s/p TNK 7/25 and IR with TICI2b likely large vessel disease.             -patient may shower             -ELOS/Goals: 10-14 days, supervision             - Continue CIR 2.  Antithrombotics: -DVT/anticoagulation:  Pharmaceutical: Heparin              -antiplatelet therapy: Aspirin  and Plavix  x 3 months then Aspirin  alone.  3. Pain Management: Tylenol  and Flexeril  (back pain) as needed 4. Mood/Behavior/Sleep: Therapy evaluations completed due to patient decreased functional mobility was admitted for a comprehensive rehab program.              -antipsychotic agents: N/A 5. Neuropsych/cognition: This patient is not capable of making decisions on his own behalf. 6. Skin/Wound Care: Routine pressure relief measures 7. Fluids/Electrolytes/Nutrition: Monitor I/O and Daily weights.              - TF + protein supplements.  Dysphagia - upgraded to dysphagia 1 diet -TF d/ced May d/c Cortrak 8/9 if po fluid intake >1039ml  -8/9 PO intake last 3 meals as nursing reports he drank for lunch, will DC cortrak 8. ESRD on hemodialysis: BUN 139, Cr 18.98 HD on TTHS schedule- --Labs to be collected  in HD             -Hyperphos/Hyperkalemia Phos > 30, K 5.8- asking Nephro whether pt needs lokelma  today or if labs were drawn pre HD  - 8/9 reviewed nephrology notes 9. Chronic Anemia of ESRD: hgb 10.9 from 11.6 continue to monitor  10. Acute hypoxic/hypercapnic respiratory failure:  On 2 L  CXR Vague lingular and left lower lobe airspace opacification   Repeat CXR done 8/5 pending.  Suspect underlying OSA will check overnite oximetry  11. Leukocytosis: WBC 16.0-12.3-11.2, afebrile see above  12. Combined systolic and diastolic HF/HTN: home meds amlodipine , isosorbide  and carvedilol .              - Monitor for orthostasis with activity.  13. Secondary HPTH: Home auryxia  cannot be given via NG tube--continue Fosrenol   14. HLD: LDL 85 Atorvastatin  40 mg  15. Obesity: BMI 39.77 educate on diet and weight loss to promote overall health and mobility.  16.  Hypertension: Long-term goal normotensive.  Continue Norvasc , Coreg , isosorbide   Vitals:   10/22/23 1338 10/22/23 1351  BP:  (!) 166/73  Pulse: 84 91  Resp: (!) 23 (!) 28  Temp: 97.7 F (36.5 C)   SpO2: 91% (!) 89%   Increase coreg  to 12.5 still hypertensive with elevated HR -8/9 continue monitor trend 17.  Hyperlipidemia: Continue statin    18.  Vasovagal episode due to HD (2L off yesterday ) as well as reduce fluid intake po and minmal TF volumes, no off TF, will increase free H20 today and enc po fluid intake to >1084ml , Nephro managing HD which will be tomorrow 8/9 19. Itching  -no rash noted, continue as needed Benadryl  and Sarna lotion  LOS: 4 days A FACE TO FACE EVALUATION WAS PERFORMED  Murray Collier 10/22/2023, 2:46 PM

## 2023-10-22 NOTE — Progress Notes (Signed)
 [facility] Progress Note   Subjective:    Seen and examined patient on the rehab unit. Currently working with PT. He denies any acute issues. Plan for HD this afternoon.  Objective Vitals:   10/21/23 1415 10/21/23 1939 10/22/23 0424 10/22/23 0500  BP: 104/72 (!) 143/68 120/81   Pulse: (!) 101 95 76   Resp: 18 15 18    Temp: 98 F (36.7 C) 98.2 F (36.8 C) 98.5 F (36.9 C)   TempSrc: Oral  Oral   SpO2: 96% 94% 96%   Weight:    131.7 kg  Height:       Physical Exam General: Working with PT, no distress, obese, cortrak in place Heart: Normal rate, no rub Lungs: Diminished at bases, otherwise clear ; breathing unlabored Abdomen: soft, distended Extremities: No LE edema appreciated, warm and well-perfused Dialysis Access: AVF +t/b  Filed Weights   10/20/23 0639 10/20/23 1125 10/22/23 0500  Weight: 132.3 kg (S) 128.8 kg 131.7 kg    Intake/Output Summary (Last 24 hours) at 10/22/2023 1139 Last data filed at 10/22/2023 0919 Gross per 24 hour  Intake 1280 ml  Output --  Net 1280 ml    Additional Objective Labs: Basic Metabolic Panel: Recent Labs  Lab 10/20/23 0616 10/20/23 1107 10/22/23 0500  NA 132* 133* 133*  K 5.1 3.8 5.2*  CL 91* 91* 90*  CO2 23 27 24   GLUCOSE 129* 152* 106*  BUN 117* 50* 102*  CREATININE 16.05* 7.66* 15.03*  CALCIUM  9.9 9.9 10.1  PHOS 11.4* 4.2 9.8*   Liver Function Tests: Recent Labs  Lab 10/20/23 0616 10/20/23 1107 10/22/23 0500  ALBUMIN 3.3* 3.7 3.5   No results for input(s): LIPASE, AMYLASE in the last 168 hours. CBC: Recent Labs  Lab 10/17/23 0117 10/18/23 1000 10/20/23 0616 10/22/23 0500  WBC 12.3* 11.2* 9.8 9.1  NEUTROABS  --   --  6.0 5.0  HGB 11.6* 10.9* 10.6* 11.1*  HCT 36.0* 33.9* 33.3* 34.0*  MCV 99.7 98.5 99.1 98.0  PLT 363 384 381 353   Blood Culture    Component Value Date/Time   SDES TRACHEAL ASPIRATE 10/08/2023 0627   SPECREQUEST NONE 10/08/2023 0627   CULT  10/08/2023 0627     Normal respiratory flora-no Staph aureus or Pseudomonas seen Performed at [facility], [address], [place], [place] [address]    REPTSTATUS 10/10/2023 FINAL 10/08/2023 9372    Cardiac Enzymes: No results for input(s): CKTOTAL, CKMB, CKMBINDEX, TROPONINI in the last 168 hours. CBG: Recent Labs  Lab 10/21/23 1127 10/21/23 1614 10/21/23 2042 10/22/23 0032 10/22/23 0418  GLUCAP 113* 97 121* 103* 90   Iron  Studies: No results for input(s): IRON , TIBC, TRANSFERRIN, FERRITIN in the last 72 hours. Lab Results  Component Value Date   INR 1.0 10/07/2023   Studies/Results: No results found.  Medications:   amLODipine   10 mg Oral Daily   aspirin   81 mg Oral Daily   atorvastatin   40 mg Oral Daily   carvedilol   12.5 mg Oral BID   Chlorhexidine  Gluconate Cloth  6 each Topical Q0600   clopidogrel   75 mg Oral Daily   docusate  100 mg Oral BID   free water   100 mL Per Tube Q6H   heparin  injection (subcutaneous)  5,000 Units Subcutaneous Q8H   insulin  aspart  0-6 Units Subcutaneous Q4H   isosorbide  dinitrate  5 mg Oral BID   lanthanum   1,000 mg Oral TID WC   mouth rinse  15  mL Mouth Rinse 4 times per day   pantoprazole   40 mg Oral QHS    Dialysis Orders: TTS - AF 4:15hr, 500/600, EDW 138kg, 2K/2C bath, AVF, heparin  4000+ - Mircera 50mcg given 10/06/23 - Hectoral 7mcg IV q HD - Takes Auryxia  3/meals and Xphozah 30mg  BID  Assessment/Plan: Acute R MCA CVA: S/p TNK 7/25. Required intubation, now on [place] O2. Ongoing L sided weakness. Per neuro. Working with PT currently. Now in CIR. AHRF, ?aspiration + CVA: Now extubated, on [place] O2. S/p Unasyn  course. WBC normalized.  ESRD: Continue HD on TTS schedule. Next HD this afternoon. Hyperkalemia: S/p Lokelma  X 3 doses. K+ now is 5.2. Monitor trend, may need to consider Lokelma  on non-HD days if no improvement HTN/volume: BP decent, occ high - UF as tolerated, keep lowering EDW. Anemia of ESRD: Hgb 10.6  today, no ESA for now. Secondary HPTH: Corrected Ca 10.4 and Phos 11.4, VDRA on hold. Home auryxia  can not be given via NG tube - change to fosrenol  for now. Nutrition: Alb low, getting TF + protein supplements. Per RD - unable to do Nepro at this point, will change in future.  Nathan Piety, NP [facility] 10/22/2023,11:39 AM  LOS: 4 days

## 2023-10-22 NOTE — Progress Notes (Signed)
 Occupational Therapy Session Note  Patient Details  Name: Nathan Campbell MRN: [id number] Date of Birth: [birth year]  Today's Date: 10/22/2023 OT Individual Time: 1032-1130 OT Individual Time Calculation (min): 58 min   Short Term Goals: Week 1:  OT Short Term Goal 1 (Week 1): Pt will be able to don shirt with min A. OT Short Term Goal 2 (Week 1): Pt will be able to don pants over feet with min A. OT Short Term Goal 3 (Week 1): pt will be able to perform self ROM of LUE. OT Short Term Goal 4 (Week 1): Pt will be able to bathe with min A using long handled sponge.  Skilled Therapeutic Interventions/Progress Updates:    Pt greeted semi-reclined in bed asleep, easy to wake and agreeable to OT treatment session. OT encouraged pt to wash up, but he declined, only wanting to brush his teeth. Pt completed bed mobility with HOB elevated, min cues for sequencing and mod cues for L attention-overall mod A to get to sitting. Pt then completed stand-pivot with quad cane and min A. OT educated on use of L UE as a stabilizer to hold toothbrush and apply toothpaste. Cues for initiation and sequencing. Pt then brought to therapy gym. Pt with some good activation of R UE, demonstrating grasp/release with cues. Pt reported his R arm was ithcing and was able to cross midline to reach with L hand to itch his arm! Cone stacking activity completed with cues for each movement pattern. OT provided sponge and practiced grasp/release with sponge. Introduced theraputty to try to work on pinch, but more difficulty with smaller movement patterns. Weight bearing towel pushes in standing 5 sets of 30 for high reps. Pt returned to room and pivoted back to bed with min A. Pt left semi-reclined in bed with bed alarm on, call bell in reach, and needs met.   Therapy Documentation Precautions:  Precautions Precautions: Fall Precaution/Restrictions Comments: L hemipareisis UE>LE, L hemianopsia, cortrak, watch SpO2 and  RR Restrictions Weight Bearing Restrictions Per Provider Order: No Pain:  Denies pain   Therapy/Group: Individual Therapy  Sharyle GORMAN Pert 10/22/2023, 11:21 AM

## 2023-10-23 DIAGNOSIS — I951 Orthostatic hypotension: Secondary | ICD-10-CM

## 2023-10-23 MED ORDER — ONDANSETRON HCL 4 MG PO TABS
4.0000 mg | ORAL_TABLET | Freq: Four times a day (QID) | ORAL | Status: DC | PRN
  Administered 2023-10-23 – 2023-10-27 (×4): 4 mg via ORAL
  Filled 2023-10-23 (×3): qty 1

## 2023-10-23 MED ORDER — AMLODIPINE BESYLATE 5 MG PO TABS
5.0000 mg | ORAL_TABLET | Freq: Every day | ORAL | Status: DC
  Administered 2023-10-24 – 2023-10-26 (×6): 5 mg via ORAL
  Filled 2023-10-23 (×3): qty 1

## 2023-10-23 NOTE — Progress Notes (Signed)
 Occupational Therapy Session Note  Patient Details  Name: Nathan Campbell MRN: [id number] Date of Birth: 12/11/1975  Today's Date: 10/23/2023 OT Individual Time: 1405-1510 OT Individual Time Calculation (min): 65 min    Short Term Goals: Week 1:  OT Short Term Goal 1 (Week 1): Pt will be able to don shirt with min A. OT Short Term Goal 2 (Week 1): Pt will be able to don pants over feet with min A. OT Short Term Goal 3 (Week 1): pt will be able to perform self ROM of LUE. OT Short Term Goal 4 (Week 1): Pt will be able to bathe with min A using long handled sponge.  Skilled Therapeutic Interventions/Progress Updates:      Therapy Documentation Precautions:  Precautions Precautions: Fall Precaution/Restrictions Comments: L hemipareisis UE>LE, L hemianopsia, cortrak, watch SpO2 and RR Restrictions Weight Bearing Restrictions Per Provider Order: No General: Pt supine in bed upon OT arrival, agreeable to OT session.  Pain: no pain reported  ADL: OT providing skilled intervention on ADL retraining in order to increase independence with tasks and increase activity tolerance. Pt completed the following tasks at the current level of assist: Bed mobility: Min A to rise into sitting Grooming/oral hygiene: Mod A assist with applying powder and lotion Toilet transfer: CGA with quad cane ambulating from bed no LOB/SOB Toileting: CGA for void of BM, able to complete pants management and hygiene UB dressing: Mod A, education provided on hemi dressing technique for UB with limited carryover, would benefit from reinforcement LB dressing: Max A, assistance with over BLE and management over Lt side of waist Footwear: total A for socks in sitting position Shower transfer: CGA ambulating with quad cane from toilet no LOB Bathing: Min A, assistance with feet and back, able to stand for peri hygiene using grab bar to rise into standing  Pt seated in bed with bed alarm activated, 2 bed rails up, call  light within reach and 4Ps assessed.   Therapy/Group: Individual Therapy  Camie Hoe, OTD, OTR/L 10/23/2023, 4:20 PM

## 2023-10-23 NOTE — Progress Notes (Signed)
 Speech Language Pathology Daily Session Note  Patient Details  Name: Nathan Campbell MRN: [id number] Date of Birth: [birth year]  Today's Date: 10/23/2023 SLP Individual Time: 8699-8667 SLP Individual Time Calculation (min): 32 min  Short Term Goals: Week 1: SLP Short Term Goal 1 (Week 1): Patient will consume D1/NTL diet with use of compensatory strategies given min multimodal A SLP Short Term Goal 2 (Week 1): Patient will utilize speech intelligibility strategies to reach 75% intelligibility at the word level given mod multimodal A SLP Short Term Goal 3 (Week 1): Patient will demonstrate functional problem solving during daily tasks given mod multimodal A SLP Short Term Goal 4 (Week 1): Patient will recall and utilize memory compensatory aids given mod multimodal A SLP Short Term Goal 5 (Week 1): Patient will demonstrate awareness of cognitive and physical changes since admission given mod multimodal A  Skilled Therapeutic Interventions:  Pt seen for ST targeting speech and dysphagia goals. Pt completing his meal with NT supervising upon SLP arrival. NSG reports light-headedness and vomiting this AM. Skilled observation completed during consumption of Dys1 with NTL. Pt IND used a slow rate but required mod verbal cueing to use smaller bites. Pt reports continued intermittent difficulty sensing oral residue. He was able to recall 1 of 3 safe swallow precautions when provided min A. Occasional wet vocal quality with delayed cough noted, concerning for pharyngeal residue following large bites of puree. SLP facilitated structured tx tasks targeting dysarthria and intelligibility. Pt IND recalled 50% of SLOP strategy; required mod-max verbal to use during sentences and conversation.Pt then produced multisyllabic words (3-4 syllables) when provided max verbal cueing and segmenting. Pt became frustrated during this and required a break and redirection to another task. Pt left in bed with call bell and phone  in reach and bed alarm active.   Pain Pain Assessment Pain Scale: 0-10 Pain Score: 0-No pain Faces Pain Scale: No hurt  Therapy/Group: Individual Therapy  [name], MA CCC-SLP 10/23/2023, 1:37 PM

## 2023-10-23 NOTE — Progress Notes (Signed)
 Occupational Therapy Session Note  Patient Details  Name: Nathan Campbell MRN: [id number] Date of Birth: [birth year]  Today's Date: 10/23/2023 OT Individual Time: 1100-1200 OT Individual Time Calculation (min): 60 min    Short Term Goals: Week 1:  OT Short Term Goal 1 (Week 1): Pt will be able to don shirt with min A. OT Short Term Goal 2 (Week 1): Pt will be able to don pants over feet with min A. OT Short Term Goal 3 (Week 1): pt will be able to perform self ROM of LUE. OT Short Term Goal 4 (Week 1): Pt will be able to bathe with min A using long handled sponge.  Skilled Therapeutic Interventions/Progress Updates:    Patient in bed resting at the time of arrival. PT informed me in route to the pt  room that he wasn't feeling well.  The pt was reported as presenting with a drop in his BP followed by an episode of upchucking. Upon introducing myself to the pt and explaining the nature of my visit,  the pt  agreed to engaging in the therapeutic process. I informed the patient that I would like to focus on NMR  associated with the LUE .  The pt was able to transfer from supine in bed to EOB  with MinA.   The pt was able to tolerate manual manipulation of the L scapular and surrounding structures to improve communication for gains in functional mobility.  The pt was able complete AAROM of the LUE with MinA . The went on to  engage in a resistive exercise using the LUE to improve upon incorporating the extremity as an assist. A yellow sock was modified and applied to the pt LUE to address his L inattention and the pt was encourage to incorporate his sight as a sensory component for gains in mobility with the LUE.  The pt was also instructed to grasp a 1lb dowel that was support while incorporating  bilateral hands for bring the dowel into shld flexion 5x. The pt was encourage to incorporate his visual acuity during the exercise also.  At the end of the session, the pt returned to bed LOF with the call  light and bed side table within reach and all additional needs addressed.   Therapy Documentation Precautions:  Precautions Precautions: Fall Precaution/Restrictions Comments: L hemipareisis UE>LE, L hemianopsia, cortrak, watch SpO2 and RR Restrictions Weight Bearing Restrictions Per Provider Order: No  Therapy/Group: Individual Therapy  Elvera JONETTA Mace 10/23/2023, 4:11 PM

## 2023-10-23 NOTE — Progress Notes (Signed)
 [facility] Progress Note   Subjective:    Seen and examined patient at bedside. Currently working with PT and doing well. Tolerated yesterday's HD with net UF 2L.   Objective Vitals:   10/22/23 1910 10/22/23 1954 10/23/23 0500 10/23/23 0604  BP: 103/65 126/71  (!) 152/82  Pulse: 85 94  90  Resp:  15  15  Temp:  98 F (36.7 C)  97.8 F (36.6 C)  TempSrc:      SpO2: 97% 96%  94%  Weight:   130.9 kg   Height:       Physical Exam General: Working with PT, no distress, obese, cortrak in place Heart: Normal rate, no rub Lungs: Diminished at bases, otherwise clear ; breathing unlabored Abdomen: soft, distended Extremities: No LE edema appreciated, warm and well-perfused Dialysis Access: AVF +t/b  Filed Weights   10/22/23 1338 10/22/23 1708 10/23/23 0500  Weight: 133.1 kg 131.1 kg 130.9 kg    Intake/Output Summary (Last 24 hours) at 10/23/2023 1205 Last data filed at 10/23/2023 0851 Gross per 24 hour  Intake 560 ml  Output 2000 ml  Net -1440 ml    Additional Objective Labs: Basic Metabolic Panel: Recent Labs  Lab 10/20/23 0616 10/20/23 1107 10/22/23 0500  NA 132* 133* 133*  K 5.1 3.8 5.2*  CL 91* 91* 90*  CO2 23 27 24   GLUCOSE 129* 152* 106*  BUN 117* 50* 102*  CREATININE 16.05* 7.66* 15.03*  CALCIUM  9.9 9.9 10.1  PHOS 11.4* 4.2 9.8*   Liver Function Tests: Recent Labs  Lab 10/20/23 0616 10/20/23 1107 10/22/23 0500  ALBUMIN 3.3* 3.7 3.5   No results for input(s): LIPASE, AMYLASE in the last 168 hours. CBC: Recent Labs  Lab 10/17/23 0117 10/18/23 1000 10/20/23 0616 10/22/23 0500  WBC 12.3* 11.2* 9.8 9.1  NEUTROABS  --   --  6.0 5.0  HGB 11.6* 10.9* 10.6* 11.1*  HCT 36.0* 33.9* 33.3* 34.0*  MCV 99.7 98.5 99.1 98.0  PLT 363 384 381 353   Blood Culture    Component Value Date/Time   SDES TRACHEAL ASPIRATE 10/08/2023 0627   SPECREQUEST NONE 10/08/2023 0627   CULT  10/08/2023 0627    Normal respiratory flora-no Staph aureus  or Pseudomonas seen Performed at [facility], [address] [place], [place] [address]    REPTSTATUS 10/10/2023 FINAL 10/08/2023 9372    Cardiac Enzymes: No results for input(s): CKTOTAL, CKMB, CKMBINDEX, TROPONINI in the last 168 hours. CBG: Recent Labs  Lab 10/22/23 0032 10/22/23 0418 10/22/23 1229 10/22/23 1659 10/22/23 1957  GLUCAP 103* 90 107* 87 130*   Iron  Studies: No results for input(s): IRON , TIBC, TRANSFERRIN, FERRITIN in the last 72 hours. Lab Results  Component Value Date   INR 1.0 10/07/2023   Studies/Results: No results found.  Medications:   amLODipine   10 mg Oral Daily   aspirin   81 mg Oral Daily   atorvastatin   40 mg Oral Daily   carvedilol   12.5 mg Oral BID   Chlorhexidine  Gluconate Cloth  6 each Topical Q0600   clopidogrel   75 mg Oral Daily   docusate  100 mg Oral BID   heparin  injection (subcutaneous)  5,000 Units Subcutaneous Q8H   isosorbide  dinitrate  5 mg Oral BID   lanthanum   1,000 mg Oral TID WC   mouth rinse  15 mL Mouth Rinse 4 times per day   pantoprazole   40 mg Oral QHS    Dialysis Orders: TTS - AF 4:15hr,  500/600, EDW 138kg, 2K/2C bath, AVF, heparin  4000+ - Mircera 50mcg given 10/06/23 - Hectoral 7mcg IV q HD - Takes Auryxia  3/meals and Xphozah 30mg  BID  Assessment/Plan: Acute R MCA CVA: S/p TNK 7/25. Required intubation, now on [place] O2. Ongoing L sided weakness. Per neuro. Working with PT currently. Now in CIR. AHRF, ?aspiration + CVA: Now extubated, on [place] O2. S/p Unasyn  course. WBC normalized.  ESRD: Continue HD on TTS schedule. Next HD 8/12. Hyperkalemia: S/p Lokelma  X 3 doses. K+ now is 5.2. Checking labs in AM. Monitor trend, may need to consider Lokelma  on non-HD days if no improvement HTN/volume: BP decent, occ high - UF as tolerated, keep lowering EDW. Anemia of ESRD: Hgb 11.1, no ESA for now. Secondary HPTH: Corrected Ca 10.4 and Phos 11.4, VDRA on hold. Home auryxia  can not be given via  NG tube - change to fosrenol  for now. Nutrition: Alb low, getting TF + protein supplements. Per RD - unable to do Nepro at this point, will change in future.  Charmaine Piety, NP [facility] 10/23/2023,12:05 PM  LOS: 5 days

## 2023-10-23 NOTE — Progress Notes (Signed)
 Physical Therapy Session Note  Patient Details  Name: Nathan Campbell MRN: [id number] Date of Birth: February 04, 1976  Today's Date: 10/23/2023 PT Individual Time: 9093-9054 PT Individual Time Calculation (min): 39 min   Short Term Goals: Week 1:  PT Short Term Goal 1 (Week 1): Pt will demosntrate bed mobility with CGA and good technique. PT Short Term Goal 2 (Week 1): pt will perform standing transfers with consistent SBA/ CGA. PT Short Term Goal 3 (Week 1): Pt will perform ambulation up to 100 ft using LRAD with CGA. PT Short Term Goal 4 (Week 1): Pt will demonstrate stair navigation up /down at least 8 steps with overall MinA. PT Short Term Goal 5 (Week 1): Pt will complete appropriate outcome measure.  Skilled Therapeutic Interventions/Progress Updates: Pt presented in bed sleeping but easily aroused and agreeable to therapy. Pt initially lethargic at start of session requiring increased stimuli to stay awake but improved throughout session. Pt able to state that coretrack was removed yesterday and was able to get a good night's sleep. Pt requesting to use bathroom, completed supine to sit with CGA, increased time and use of bed features. Provided with [facility], stood with CGA and ambulated with CGA to toilet. Pt requiring min cues for obstable negotiation due to L inattention. Pt required minA for LB clothing management and CGA for toilet transfers (+BM). Once completed pt stood with CGA to allow PTA to complete peri-care. Pt then indicating increased dizziness and feeling off. Pt returned to sitting and noted significant increased sway in sitting with pt using grab bar to hold self. PTA called for second person for  safety and completed stand step transfer to w/c and transported bedside. Pt completed stand step transfer in same manner to bed and completed sit to supine with supervision. BP assessed 105/63 (76) HR 101. Pt states feels flush and nauseous, nsg notified and pt left resting in bed with call  bell within reach and needs met.      Therapy Documentation Precautions:  Precautions Precautions: Fall Precaution/Restrictions Comments: L hemipareisis UE>LE, L hemianopsia, cortrak, watch SpO2 and RR Restrictions Weight Bearing Restrictions Per Provider Order: No General:   Vital Signs:   Pain:     Therapy/Group: Individual Therapy  Rhys Anchondo 10/23/2023, 12:33 PM

## 2023-10-23 NOTE — Plan of Care (Signed)
  Problem: Consults Goal: RH STROKE PATIENT EDUCATION Description: See Patient Education module for education specifics  Outcome: Progressing   Problem: RH BOWEL ELIMINATION Goal: RH STG MANAGE BOWEL WITH ASSISTANCE Description: STG Manage Bowel with mod I Assistance. Outcome: Progressing Goal: RH STG MANAGE BOWEL W/MEDICATION W/ASSISTANCE Description: STG Manage Bowel with Medication with mod I Assistance. Outcome: Progressing   Problem: RH SAFETY Goal: RH STG ADHERE TO SAFETY PRECAUTIONS W/ASSISTANCE/DEVICE Description: STG Adhere to Safety Precautions With cues Assistance/Device. Outcome: Progressing   Problem: RH PAIN MANAGEMENT Goal: RH STG PAIN MANAGED AT OR BELOW PT'S PAIN GOAL Description: Pain < 4 with prns Outcome: Progressing   Problem: RH KNOWLEDGE DEFICIT Goal: RH STG INCREASE KNOWLEDGE OF HYPERTENSION Description: Patient and family will be able to manage HTN using educational resources for medications and dietary modification independently Outcome: Progressing Goal: RH STG INCREASE KNOWLEDGE OF DYSPHAGIA/FLUID INTAKE Description: Patient and family will be able to manage dysphagia using educational resources for medications and dietary modification independently Outcome: Progressing Goal: RH STG INCREASE KNOWLEGDE OF HYPERLIPIDEMIA Description: Patient and family will be able to manage HLD using educational resources for medications and dietary modification independently Outcome: Progressing Goal: RH STG INCREASE KNOWLEDGE OF STROKE PROPHYLAXIS Description: Patient and family will be able to manage secondary risks using educational resources for medications and dietary modification independently Outcome: Progressing

## 2023-10-23 NOTE — Progress Notes (Signed)
 PROGRESS NOTE   Subjective/Complaints: Patient tired today, had dialysis yesterday.  Had a couple episodes where he was lightheaded, potentially orthostatic hypotension.  Had episode of nausea this morning that improved with as needed Zofran .  ROS-denies CP, SOB, V/D, abdominal pain + itch + nausea- improved  Objective:   No results found.  Recent Labs    10/22/23 0500  WBC 9.1  HGB 11.1*  HCT 34.0*  PLT 353   Recent Labs    10/22/23 0500  NA 133*  K 5.2*  CL 90*  CO2 24  GLUCOSE 106*  BUN 102*  CREATININE 15.03*  CALCIUM  10.1    Intake/Output Summary (Last 24 hours) at 10/23/2023 1446 Last data filed at 10/23/2023 1405 Gross per 24 hour  Intake 580 ml  Output 2000 ml  Net -1420 ml        Physical Exam: Vital Signs Blood pressure (!) 152/82, pulse 90, temperature 97.8 F (36.6 C), resp. rate 15, height 6' (1.829 m), weight 130.9 kg, SpO2 94%.   General: No acute distress, laying in bed  Mood and affect are appropriate Heart: Regular rate and rhythm no rubs murmurs or extra sounds Lungs: Clear to auscultation, breathing unlabored, no rales or wheezes Abdomen: Positive bowel sounds, soft nontender to palpation, nondistended Extremities: No clubbing, cyanosis, or edema Skin: No evidence of breakdown, no evidence of rash Neurologic: A little drowsy this morning, cranial nerves II through XII grossly intact, follows simple commands  motor strength is 5/5 in right deltoid, bicep, tricep, grip, hip flexor, knee extensors, ankle dorsiflexor and plantar flexor 2-/5 in LUE at delt and biceps and grip, 0/5 L finger and arm extension , 4- LLE muscle groups as above  Sensory exam normal sensation to light touch and proprioception in bilateral upper and lower extremities Cerebellar exam not tested on LUE due to weakness Musculoskeletal: pain free  range of motion in all 4 extremities. No joint swelling Prior  neuro assessment is c/w today's exam 10/23/2023.   Assessment/Plan: 1. Functional deficits which require 3+ hours per day of interdisciplinary therapy in a comprehensive inpatient rehab setting. Physiatrist is providing close team supervision and 24 hour management of active medical problems listed below. Physiatrist and rehab team continue to assess barriers to discharge/monitor patient progress toward functional and medical goals  Care Tool:  Bathing    Body parts bathed by patient: Chest, Abdomen, Front perineal area, Right upper leg, Left upper leg, Face   Body parts bathed by helper: Right arm, Left arm, Buttocks, Right lower leg, Left lower leg     Bathing assist Assist Level: Maximal Assistance - Patient 24 - 49%     Upper Body Dressing/Undressing Upper body dressing   What is the patient wearing?: Pull over shirt    Upper body assist Assist Level: Maximal Assistance - Patient 25 - 49%    Lower Body Dressing/Undressing Lower body dressing      What is the patient wearing?: Pants, Incontinence brief     Lower body assist Assist for lower body dressing: Maximal Assistance - Patient 25 - 49%     Toileting Toileting Toileting Activity did not occur (Clothing management and hygiene only):  N/A (no void or bm) (dialysis patient)  Toileting assist Assist for toileting: Maximal Assistance - Patient 25 - 49%     Transfers Chair/bed transfer  Transfers assist     Chair/bed transfer assist level: Minimal Assistance - Patient > 75%     Locomotion Ambulation   Ambulation assist      Assist level: Minimal Assistance - Patient > 75% Assistive device: Cane-quad Max distance: 35 ft   Walk 10 feet activity   Assist     Assist level: Minimal Assistance - Patient > 75% Assistive device: Cane-quad   Walk 50 feet activity   Assist Walk 50 feet with 2 turns activity did not occur: Safety/medical concerns         Walk 150 feet activity   Assist Walk  150 feet activity did not occur: Safety/medical concerns         Walk 10 feet on uneven surface  activity   Assist Walk 10 feet on uneven surfaces activity did not occur: Safety/medical concerns         Wheelchair     Assist Is the patient using a wheelchair?: Yes Type of Wheelchair: Manual    Wheelchair assist level: Dependent - Patient 0% Max wheelchair distance: 300 ft    Wheelchair 50 feet with 2 turns activity    Assist        Assist Level: Dependent - Patient 0%   Wheelchair 150 feet activity     Assist      Assist Level: Dependent - Patient 0%   Blood pressure (!) 152/82, pulse 90, temperature 97.8 F (36.6 C), resp. rate 15, height 6' (1.829 m), weight 130.9 kg, SpO2 94%.  Medical Problem List and Plan: 1. Functional deficits secondary to acute R MCA CVA with right M1 occlusion s/p TNK 7/25 and IR with TICI2b likely large vessel disease.             -patient may shower             -ELOS/Goals: 10-14 days, supervision             - Continue CIR 2.  Antithrombotics: -DVT/anticoagulation:  Pharmaceutical: Heparin              -antiplatelet therapy: Aspirin  and Plavix  x 3 months then Aspirin  alone.  3. Pain Management: Tylenol  and Flexeril  (back pain) as needed 4. Mood/Behavior/Sleep: Therapy evaluations completed due to patient decreased functional mobility was admitted for a comprehensive rehab program.              -antipsychotic agents: N/A 5. Neuropsych/cognition: This patient is not capable of making decisions on his own behalf. 6. Skin/Wound Care: Routine pressure relief measures 7. Fluids/Electrolytes/Nutrition: Monitor I/O and Daily weights.              - TF + protein supplements.  Dysphagia - upgraded to dysphagia 1 diet -TF d/ced May d/c Cortrak 8/9 if po fluid intake >1023ml  -8/9 PO intake last 3 meals as nursing reports he drank for lunch, will DC cortrak  - 8/10 eating 100% of most meals, encourage fluid intake 8.  ESRD on hemodialysis: BUN 139, Cr 18.98 HD on TTHS schedule- --Labs to be collected in HD             -Hyperphos/Hyperkalemia Phos > 30, K 5.8- asking Nephro whether pt needs lokelma  today or if labs were drawn pre HD  - 8/9 reviewed nephrology notes  - 8/10 will discuss potential orthostatic hypotension  or episodes with nephrology, adjust Norvasc  as #16 9. Chronic Anemia of ESRD: hgb 10.9 from 11.6 continue to monitor  10. Acute hypoxic/hypercapnic respiratory failure: On 2 L [place] CXR Vague lingular and left lower lobe airspace opacification   Repeat CXR done 8/5 pending.  Suspect underlying OSA will check overnite oximetry  11. Leukocytosis: WBC 16.0-12.3-11.2, afebrile see above  12. Combined systolic and diastolic H: home meds amlodipine , isosorbide  and carvedilol .  Fluid management per nephrology.             - Monitor for orthostasis with activity.  13. Secondary HPTH: Home auryxia  cannot be given via NG tube--continue Fosrenol   14. HLD: LDL 85 Atorvastatin  40 mg  15. Obesity: BMI 39.77 educate on diet and weight loss to promote overall health and mobility.  16.  Hypertension: Long-term goal normotensive.  Continue Norvasc , Coreg , isosorbide   Vitals:   10/22/23 1954 10/23/23 0604  BP: 126/71 (!) 152/82  Pulse: 94 90  Resp: 15 15  Temp: 98 F (36.7 C) 97.8 F (36.6 C)  SpO2: 96% 94%   Increase coreg  to 12.5 still hypertensive with elevated HR -8/10 decrease Norvasc  to 5 mg as BP intermittently soft, suspected orthostatic hypotension 17.  Hyperlipidemia: Continue statin    18.  Vasovagal episode due to HD (2L off yesterday ) as well as reduce fluid intake po and minmal TF volumes, no off TF, will increase free H20 today and enc po fluid intake to >1077ml , Nephro managing HD which will be tomorrow 8/9  19. Itching  -no rash noted, continue as needed Benadryl  and Sarna lotion  LOS: 5 days A FACE TO FACE EVALUATION WAS PERFORMED  Murray Collier 10/23/2023, 2:46 PM

## 2023-10-24 ENCOUNTER — Other Ambulatory Visit: Payer: Self-pay

## 2023-10-24 DIAGNOSIS — F09 Unspecified mental disorder due to known physiological condition: Secondary | ICD-10-CM

## 2023-10-24 LAB — CBC WITH DIFFERENTIAL/PLATELET
Abs Immature Granulocytes: 0.08 K/uL — ABNORMAL HIGH (ref 0.00–0.07)
Basophils Absolute: 0.1 K/uL (ref 0.0–0.1)
Basophils Relative: 1 %
Eosinophils Absolute: 0.9 K/uL — ABNORMAL HIGH (ref 0.0–0.5)
Eosinophils Relative: 9 %
HCT: 34.7 % — ABNORMAL LOW (ref 39.0–52.0)
Hemoglobin: 11.3 g/dL — ABNORMAL LOW (ref 13.0–17.0)
Immature Granulocytes: 1 %
Lymphocytes Relative: 16 %
Lymphs Abs: 1.8 K/uL (ref 0.7–4.0)
MCH: 32.2 pg (ref 26.0–34.0)
MCHC: 32.6 g/dL (ref 30.0–36.0)
MCV: 98.9 fL (ref 80.0–100.0)
Monocytes Absolute: 1.6 K/uL — ABNORMAL HIGH (ref 0.1–1.0)
Monocytes Relative: 14 %
Neutro Abs: 6.5 K/uL (ref 1.7–7.7)
Neutrophils Relative %: 59 %
Platelets: 363 K/uL (ref 150–400)
RBC: 3.51 MIL/uL — ABNORMAL LOW (ref 4.22–5.81)
RDW: 13.2 % (ref 11.5–15.5)
WBC: 10.9 K/uL — ABNORMAL HIGH (ref 4.0–10.5)
nRBC: 0 % (ref 0.0–0.2)

## 2023-10-24 LAB — RENAL FUNCTION PANEL
Albumin: 3.6 g/dL (ref 3.5–5.0)
Anion gap: 17 — ABNORMAL HIGH (ref 5–15)
BUN: 88 mg/dL — ABNORMAL HIGH (ref 6–20)
CO2: 28 mmol/L (ref 22–32)
Calcium: 10.4 mg/dL — ABNORMAL HIGH (ref 8.9–10.3)
Chloride: 88 mmol/L — ABNORMAL LOW (ref 98–111)
Creatinine, Ser: 15.04 mg/dL — ABNORMAL HIGH (ref 0.61–1.24)
GFR, Estimated: 4 mL/min — ABNORMAL LOW
Glucose, Bld: 105 mg/dL — ABNORMAL HIGH (ref 70–99)
Phosphorus: 10 mg/dL — ABNORMAL HIGH (ref 2.5–4.6)
Potassium: 5.9 mmol/L — ABNORMAL HIGH (ref 3.5–5.1)
Sodium: 133 mmol/L — ABNORMAL LOW (ref 135–145)

## 2023-10-24 LAB — HEPATITIS B SURFACE ANTIGEN: Hepatitis B Surface Ag: NONREACTIVE

## 2023-10-24 MED ORDER — SODIUM ZIRCONIUM CYCLOSILICATE 10 G PO PACK
10.0000 g | PACK | Freq: Two times a day (BID) | ORAL | Status: AC
  Administered 2023-10-24 – 2023-10-25 (×6): 10 g via ORAL
  Filled 2023-10-24 (×3): qty 1

## 2023-10-24 NOTE — Progress Notes (Signed)
 Occupational Therapy Session Note  Patient Details  Name: Nathan Campbell MRN: [id number] Date of Birth: 1975-05-09  Today's Date: 10/24/2023 OT Individual Time: 1305-1400 OT Individual Time Calculation (min): 55 min    Short Term Goals: Week 1:  OT Short Term Goal 1 (Week 1): Pt will be able to don shirt with min A. OT Short Term Goal 2 (Week 1): Pt will be able to don pants over feet with min A. OT Short Term Goal 3 (Week 1): pt will be able to perform self ROM of LUE. OT Short Term Goal 4 (Week 1): Pt will be able to bathe with min A using long handled sponge.  Skilled Therapeutic Interventions/Progress Updates:  Pt greeted seated in w/c, pt agreeable to OT intervention.      Transfers/bed mobility/functional mobility:  Pt completed all sit>stands and stand pivot transfer with quad cane and CGA.    NMR: pt complete various active assist ROM exercises to provide NMR to LUE and to promote improved AROM in hemiparetic UE. Pt completed towel slides via shoulder flexion in closed chain pattern with pt reaching towards various targets. Pt noted to be highly distracted in chatty gym needing MAX cues to attend to task.   Attempted to work on grasping wash cloths and lifting items against gravity however pt unable to complete dual task d/t increased demand on hemiparetic UE.    Pt completed functional reaching in standing with pt instructed to stand to hi lo table with LUE supported on airex and pt instructed to use RUE to place plinko game pieces in designated slots to facilitate NMRE to LUE. Pt completed task with CGA for balance.   Attempted NMES to LUE however pt reported it to be uncomfortable therefore suspended task for pt comfort.    Ended session with pt seated EOB with all needs within reach.   Therapy Documentation Precautions:  Precautions Precautions: Fall Precaution/Restrictions Comments: L hemipareisis UE>LE, L hemianopsia, cortrak, watch SpO2 and  RR Restrictions Weight Bearing Restrictions Per Provider Order: No  Pain: pain reported in LUE during attempted estim, suspended task.    Therapy/Group: Individual Therapy  Ronal Gift [facility] 10/24/2023, 2:00 PM

## 2023-10-24 NOTE — Progress Notes (Signed)
 Speech Language Pathology Daily Session Note  Patient Details  Name: Nathan Campbell MRN: [id number] Date of Birth: [birth year]  Today's Date: 10/24/2023 SLP Individual Time: 0800-0900 SLP Individual Time Calculation (min): 60 min  Short Term Goals: Week 1: SLP Short Term Goal 1 (Week 1): Patient will consume D1/NTL diet with use of compensatory strategies given min multimodal A SLP Short Term Goal 2 (Week 1): Patient will utilize speech intelligibility strategies to reach 75% intelligibility at the word level given mod multimodal A SLP Short Term Goal 3 (Week 1): Patient will demonstrate functional problem solving during daily tasks given mod multimodal A SLP Short Term Goal 4 (Week 1): Patient will recall and utilize memory compensatory aids given mod multimodal A SLP Short Term Goal 5 (Week 1): Patient will demonstrate awareness of cognitive and physical changes since admission given mod multimodal A  Skilled Therapeutic Interventions: Skilled therapy session focused on dysphagia and communication goals. SLP facilitated session by observing patient with D1/NTL breakfast tray. Patient required min verbal A to reduce bolus size and consume PO at a slow rate. Patient with anterior spillage from the L, especially when consuming large bites of PO. Patient with x1 delayed cough after PO consumption was completed. Recommend continuation of current diet with FULL supervision. SLP targeted cognitive goals through orientation task. Patient independently oriented to self, situation, location and time. When targeting communication, patient recalled 2/4 speech intelligibility strategies and required re-education on remainder. SLP provided mod A for patient to utilize strategies at the single word level. Patient was approximately 90% intelligible with single syllable words, though this decreased to ~50% intelligible at the 3 syllable word level. Of note, patinet with occasional instances of wet vocal quality  during speech exercises, cleared with cough/throat clear - ? due to pharyngeal residual vs phlegm. Patient recalled all strategies with use of external aid at the end of the session. At the end of the session, patient with episode of nausea and dry heaving into emesis bag. Nursing aware. Patient left in chair with alarm set and call bell in reach. Continue POC  Pain Nausea at the end of the session - nursing aware  Therapy/Group: Individual Therapy  Skylan Lara M.A., CCC-SLP 10/24/2023, 7:36 AM

## 2023-10-24 NOTE — Progress Notes (Signed)
 PROGRESS NOTE   Subjective/Complaints:  Eating great, K+ elevated this am , messaged Nephro NP ,  Discussed no driving with pt , he  agrees and thinks his sister can drive him to some HD treatments but my need to take transportation to some treatments  ROS-denies CP, SOB, V/D, abdominal pain   Objective:   No results found.  Recent Labs    10/22/23 0500 10/24/23 0553  WBC 9.1 10.9*  HGB 11.1* 11.3*  HCT 34.0* 34.7*  PLT 353 363   Recent Labs    10/22/23 0500 10/24/23 0553  NA 133* 133*  K 5.2* 5.9*  CL 90* 88*  CO2 24 28  GLUCOSE 106* 105*  BUN 102* 88*  CREATININE 15.03* 15.04*  CALCIUM  10.1 10.4*    Intake/Output Summary (Last 24 hours) at 10/24/2023 0806 Last data filed at 10/23/2023 2200 Gross per 24 hour  Intake 480 ml  Output --  Net 480 ml        Physical Exam: Vital Signs Blood pressure 128/71, pulse 94, temperature 98.2 F (36.8 C), resp. rate 20, height 6' (1.829 m), weight 130.2 kg, SpO2 94%.   General: No acute distress, laying in bed  Mood and affect are appropriate Heart: Regular rate and rhythm no rubs murmurs or extra sounds Lungs: Clear to auscultation, breathing unlabored, no rales or wheezes Abdomen: Positive bowel sounds, soft nontender to palpation, nondistended Extremities: No clubbing, cyanosis, or edema Skin: No evidence of breakdown, no evidence of rash Neurologic: A little drowsy this morning, cranial nerves II through XII grossly intact, follows simple commands  motor strength is 5/5 in right deltoid, bicep, tricep, grip, hip flexor, knee extensors, ankle dorsiflexor and plantar flexor 3-/5 in LUE at delt and biceps and grip, 2-/5 L finger and arm extension , 4- LLE muscle groups as above  Sensory exam normal sensation to light touch and proprioception in bilateral upper and lower extremities Cerebellar exam not tested on LUE due to weakness Musculoskeletal: pain free   range of motion in all 4 extremities. No joint swelling Prior neuro assessment is c/w today's exam 10/24/2023.   Assessment/Plan: 1. Functional deficits which require 3+ hours per day of interdisciplinary therapy in a comprehensive inpatient rehab setting. Physiatrist is providing close team supervision and 24 hour management of active medical problems listed below. Physiatrist and rehab team continue to assess barriers to discharge/monitor patient progress toward functional and medical goals  Care Tool:  Bathing    Body parts bathed by patient: Chest, Abdomen, Front perineal area, Right upper leg, Left upper leg, Face   Body parts bathed by helper: Right arm, Left arm, Buttocks, Right lower leg, Left lower leg     Bathing assist Assist Level: Maximal Assistance - Patient 24 - 49%     Upper Body Dressing/Undressing Upper body dressing   What is the patient wearing?: Pull over shirt    Upper body assist Assist Level: Maximal Assistance - Patient 25 - 49%    Lower Body Dressing/Undressing Lower body dressing      What is the patient wearing?: Pants, Incontinence brief     Lower body assist Assist for lower body dressing: Maximal Assistance -  Patient 25 - 49%     Toileting Toileting Toileting Activity did not occur Press photographer and hygiene only): N/A (no void or bm) (dialysis patient)  Toileting assist Assist for toileting: Maximal Assistance - Patient 25 - 49%     Transfers Chair/bed transfer  Transfers assist     Chair/bed transfer assist level: Minimal Assistance - Patient > 75%     Locomotion Ambulation   Ambulation assist      Assist level: Minimal Assistance - Patient > 75% Assistive device: Cane-quad Max distance: 35 ft   Walk 10 feet activity   Assist     Assist level: Minimal Assistance - Patient > 75% Assistive device: Cane-quad   Walk 50 feet activity   Assist Walk 50 feet with 2 turns activity did not occur: Safety/medical  concerns         Walk 150 feet activity   Assist Walk 150 feet activity did not occur: Safety/medical concerns         Walk 10 feet on uneven surface  activity   Assist Walk 10 feet on uneven surfaces activity did not occur: Safety/medical concerns         Wheelchair     Assist Is the patient using a wheelchair?: Yes Type of Wheelchair: Manual    Wheelchair assist level: Dependent - Patient 0% Max wheelchair distance: 300 ft    Wheelchair 50 feet with 2 turns activity    Assist        Assist Level: Dependent - Patient 0%   Wheelchair 150 feet activity     Assist      Assist Level: Dependent - Patient 0%   Blood pressure 128/71, pulse 94, temperature 98.2 F (36.8 C), resp. rate 20, height 6' (1.829 m), weight 130.2 kg, SpO2 94%.  Medical Problem List and Plan: 1. Functional deficits secondary to acute R MCA CVA with right M1 occlusion s/p TNK 7/25 and IR with TICI2b likely large vessel disease.             -patient may shower             -ELOS/Goals: 10-14 days, supervision             - Continue CIR 2.  Antithrombotics: -DVT/anticoagulation:  Pharmaceutical: Heparin              -antiplatelet therapy: Aspirin  and Plavix  x 3 months then Aspirin  alone.  3. Pain Management: Tylenol  and Flexeril  (back pain) as needed 4. Mood/Behavior/Sleep: Therapy evaluations completed due to patient decreased functional mobility was admitted for a comprehensive rehab program.              -antipsychotic agents: N/A 5. Neuropsych/cognition: This patient is not capable of making decisions on his own behalf. 6. Skin/Wound Care: Routine pressure relief measures 7. Fluids/Electrolytes/Nutrition: Monitor I/O and Daily weights.              - TF + protein supplements.  Dysphagia - upgraded to dysphagia 1 diet -TF d/ced Taking po very well , 100% meals although fluid intake recorded is still low  8. ESRD on hemodialysis: BUN 139, Cr 18.98 HD on TTHS schedule-  --Labs to be collected in HD             -Hyperphos/Hyperkalemia - per Nephro 9. Chronic Anemia of ESRD: hgb 10.9 from 11.6 continue to monitor  10. Acute hypoxic/hypercapnic respiratory failure: On 2 L [place] CXR Vague lingular and left lower lobe airspace opacification   Repeat CXR  done 8/5 pending.  Suspect underlying OSA will check overnite oximetry  11. Leukocytosis: WBC 16.0-12.3-11.2, afebrile see above  12. Combined systolic and diastolic H: home meds amlodipine , isosorbide  and carvedilol .  Fluid management per nephrology.             - Monitor for orthostasis with activity.  13. Secondary HPTH: Home auryxia  cannot be given via NG tube--continue Fosrenol   14. HLD: LDL 85 Atorvastatin  40 mg  15. Obesity: BMI 39.77 educate on diet and weight loss to promote overall health and mobility.  16.  Hypertension: Long-term goal normotensive.  Continue Norvasc , Coreg , isosorbide   Vitals:   10/23/23 2102 10/24/23 0450  BP: (!) 135/93 128/71  Pulse: 95 94  Resp:  20  Temp: 98.2 F (36.8 C) 98.2 F (36.8 C)  SpO2: 98% 94%   Increase coreg  to 12.5 still hypertensive with elevated HR -8/10 decrease Norvasc  to 5 mg as BP intermittently soft, suspected orthostatic hypotension 17.  Hyperlipidemia: Continue statin    18.  Vasovagal episode due to HD (2L off yesterday ) as well as reduce fluid intake po and minmal TF volumes, no off TF, will increase free H20 today and enc po fluid intake to >1067ml , Nephro managing HD   19. Itching  -no rash noted, continue as needed Benadryl  and Sarna lotion  LOS: 6 days A FACE TO FACE EVALUATION WAS PERFORMED  Nathan Campbell 10/24/2023, 8:06 AM

## 2023-10-24 NOTE — Progress Notes (Signed)
 Patient ID: Nathan Campbell, male   DOB: [birth year], 48 y.o.   MRN: [id number] S: complaining of N/V this morning after breakfast O:BP 128/71 (BP Location: Right Arm)   Pulse 94   Temp 98.2 F (36.8 C)   Resp 20   Ht 6' (1.829 m)   Wt 130.2 kg   SpO2 94%   BMI 38.93 kg/m   Intake/Output Summary (Last 24 hours) at 10/24/2023 0944 Last data filed at 10/23/2023 2200 Gross per 24 hour  Intake 240 ml  Output --  Net 240 ml   Intake/Output: I/O last 3 completed shifts: In: 600 [P.O.:600] Out: -   Intake/Output this shift:  No intake/output data recorded. Weight change: -2.9 kg Gen: NAD CVS: RRR Resp:CTA Abd: +BS, soft, NT/nD Ext: no edema, LUE AVG +T/B  Recent Labs  Lab 10/18/23 1000 10/20/23 0616 10/20/23 1107 10/22/23 0500 10/24/23 0553  NA 135 132* 133* 133* 133*  K 5.8* 5.1 3.8 5.2* 5.9*  CL 89* 91* 91* 90* 88*  CO2 23 23 27 24 28   GLUCOSE 103* 129* 152* 106* 105*  BUN 139* 117* 50* 102* 88*  CREATININE 18.98* 16.05* 7.66* 15.03* 15.04*  ALBUMIN 3.3* 3.3* 3.7 3.5 3.6  CALCIUM  9.8 9.9 9.9 10.1 10.4*  PHOS >30.0* 11.4* 4.2 9.8* 10.0*   Liver Function Tests: Recent Labs  Lab 10/20/23 1107 10/22/23 0500 10/24/23 0553  ALBUMIN 3.7 3.5 3.6   No results for input(s): LIPASE, AMYLASE in the last 168 hours. No results for input(s): AMMONIA in the last 168 hours. CBC: Recent Labs  Lab 10/18/23 1000 10/20/23 0616 10/22/23 0500 10/24/23 0553  WBC 11.2* 9.8 9.1 10.9*  NEUTROABS  --  6.0 5.0 6.5  HGB 10.9* 10.6* 11.1* 11.3*  HCT 33.9* 33.3* 34.0* 34.7*  MCV 98.5 99.1 98.0 98.9  PLT 384 381 353 363   Cardiac Enzymes: No results for input(s): CKTOTAL, CKMB, CKMBINDEX, TROPONINI in the last 168 hours. CBG: Recent Labs  Lab 10/22/23 0032 10/22/23 0418 10/22/23 1229 10/22/23 1659 10/22/23 1957  GLUCAP 103* 90 107* 87 130*    Iron  Studies: No results for input(s): IRON , TIBC, TRANSFERRIN, FERRITIN in the last 72  hours. Studies/Results: No results found.  amLODipine   5 mg Oral Daily   aspirin   81 mg Oral Daily   atorvastatin   40 mg Oral Daily   carvedilol   12.5 mg Oral BID   Chlorhexidine  Gluconate Cloth  6 each Topical Q0600   clopidogrel   75 mg Oral Daily   docusate  100 mg Oral BID   heparin  injection (subcutaneous)  5,000 Units Subcutaneous Q8H   isosorbide  dinitrate  5 mg Oral BID   lanthanum   1,000 mg Oral TID WC   mouth rinse  15 mL Mouth Rinse 4 times per day   pantoprazole   40 mg Oral QHS   sodium zirconium cyclosilicate   10 g Oral BID    BMET    Component Value Date/Time   NA 133 (L) 10/24/2023 0553   K 5.9 (H) 10/24/2023 0553   CL 88 (L) 10/24/2023 0553   CO2 28 10/24/2023 0553   GLUCOSE 105 (H) 10/24/2023 0553   BUN 88 (H) 10/24/2023 0553   CREATININE 15.04 (H) 10/24/2023 0553   CALCIUM  10.4 (H) 10/24/2023 0553   GFRNONAA 4 (L) 10/24/2023 0553   GFRAA 32 (L) 05/13/2017 1653   CBC    Component Value Date/Time   WBC 10.9 (H) 10/24/2023 0553   RBC 3.51 (L) 10/24/2023 0553   HGB 11.3 (  L) 10/24/2023 0553   HGB 9.2 (L) 01/22/2021 1235   HCT 34.7 (L) 10/24/2023 0553   HCT 28.9 (L) 01/22/2021 1235   PLT 363 10/24/2023 0553   PLT 208 01/22/2021 1235   MCV 98.9 10/24/2023 0553   MCV 93 01/22/2021 1235   MCH 32.2 10/24/2023 0553   MCHC 32.6 10/24/2023 0553   RDW 13.2 10/24/2023 0553   RDW 12.0 01/22/2021 1235   LYMPHSABS 1.8 10/24/2023 0553   LYMPHSABS 0.8 01/22/2021 1235   MONOABS 1.6 (H) 10/24/2023 0553   EOSABS 0.9 (H) 10/24/2023 0553   EOSABS 0.1 01/22/2021 1235   BASOSABS 0.1 10/24/2023 0553   BASOSABS 0.0 01/22/2021 1235    Dialysis Orders: TTS - AF 4:15hr, 500/600, EDW 138kg, 2K/2C bath, AVF, heparin  4000+ - Mircera 50mcg given 10/06/23 - Hectoral 7mcg IV q HD - Takes Auryxia  3/meals and Xphozah 30mg  BID   Assessment/Plan: Acute R MCA CVA: S/p TNK 7/25. Required intubation, now on [place]uc[place] O2. Ongoing L sided weakness. Per neuro. Working with PT  currently. Now in CIR. AHRF, ?aspiration + CVA: Now extubated, on [place] O2. S/p Unasyn  course. WBC normalized.  ESRD: Continue HD on TTS schedule. Next HD 8/12. Hyperkalemia: S/p Lokelma  X 3 doses. K+ now is 5.2. Checking labs in AM. Monitor trend, may need to consider Lokelma  on non-HD days if no improvement HTN/volume: BP decent, occ high - UF as tolerated, keep lowering EDW. Anemia of ESRD: Hgb 11.1, no ESA for now. Secondary HPTH: Corrected Ca 10.4 and Phos 11.4, VDRA on hold. Home auryxia  can not be given via NG tube - change to fosrenol  for now. Nutrition: Alb low, getting TF + protein supplements. Per RD - unable to do Nepro at this point, will change in future. Hyperkalemia - likely due to tube feeds.  Will treat with lokelma  today and follow. May need to change tube feeds.  Fairy RONAL Sellar, MD [facility]

## 2023-10-24 NOTE — Progress Notes (Signed)
 Nutrition Follow-up  DOCUMENTATION CODES:  Obesity unspecified  INTERVENTION:  Continue current diet as recommended by SLP, upgrade textures as able, currently on dysphagia 1 with nectar thick liquids Continue fluid restriction per provider request Continue Magic cup TID with meals, each supplement provides 290 kcal and 9 grams of protein  NUTRITION DIAGNOSIS:  Increased nutrient needs related to acute illness as evidenced by estimated needs; ongoing.  GOAL:  Patient will meet greater than or equal to 90% of their needs; met  MONITOR:  PO intake, Labs, Weight trends  REASON FOR ASSESSMENT:  Consult Calorie Count  ASSESSMENT:  Pt with hx of ESRD on HD, HTN, and CHF transferred to CIR after an acute hospitalization at [facility] for R MCA infarct.  7/28 - cortrak placed while inpatient 7/30 - pt pulled out cortrak, NGT placed 8/1 - NGT pulled out, cortrak replaced 8/4 - MBS, DYS1/nectar thick  8/5 - admitted to CIR 8/7 - TF discontinued and Cortrak removed  Patient remains on a dysphagia 1 diet with nectar thick liquids and 1500 ml fluid restriction. Meal intakes: 75-100%, averaging 94% for the past 3-4 days.  Patient sleeping during RD visit. He woke up to answer a few questions, but was very sleepy. He says he is eating well, but stomach did not feel good after breakfast today. Per nurse tech, patient vomited after breakfast today and has been vomiting after morning meals for several days.   Admit weight: 144 kg (7/25 inpatient admission) Current weight: 130.2 kg    EDW: 138 kg (weight currently below EDW)  Labs reviewed. Na 133, K 5.9, BUN 88, creat 15.04, phos 10 CBG: 107-87-130  Medications reviewed and include colace, fosrenol , protonix , lokelma .  Diet Order:   Diet Order             DIET - DYS 1 Room service appropriate? No; Fluid consistency: Nectar Thick; Fluid restriction: 1500 mL Fluid  Diet effective now                   EDUCATION NEEDS:   Education needs have been addressed  Skin:  Skin Assessment: Reviewed RN Assessment  Last BM:  8/11 type 6  Height:  Ht Readings from Last 1 Encounters:  10/19/23 6' (1.829 m)    Weight:  Wt Readings from Last 1 Encounters:  10/24/23 130.2 kg    Ideal Body Weight:  80.9 kg  BMI:  Body mass index is 38.93 kg/m.  Estimated Nutritional Needs:  Kcal:  2300-2500 kcal/d Protein:  115-130 g/d Fluid:  1L + UOP   Suzen HUNT RD, LDN, CNSC Contact via secure chat. If unavailable, use group chat RD Inpatient.

## 2023-10-24 NOTE — Consult Note (Signed)
 Neuropsychological Consultation Comprehensive Inpatient Rehab   Patient:   Nathan Campbell   DOB:   09-13-1975  MR Number:  [id number]  Location:  [facility] [address] CENTER [address] [place] [address] Dept: [phone] Loc: [phone]           Date of Service:   10/24/2023  Start Time:   9 AM End Time:   10 AM  Provider/Observer:  Norleen Asa, Psy.D.       Clinical Neuropsychologist       Billing Code/Service: [phone]   Reason for Service:    Nathan Campbell is a 48 year old male referred for neuropsychological consultation. Currently admitted to the comprehensive inpatient rehabilitation unit (CIR) following a recent cerebral vascular accident.  Admitted to [facility] on 08/07/2023 with acute onset of left-sided weakness, left-sided neglect, dysarthria, and right gaze preference. Initial CT head revealed right MCA territory ischemic changes with a suspected calcific embolus in the proximal right M1 segment. TNK was administered. Cerebral angiogram confirmed a right M1 occlusion, and a thrombectomy was performed.  Post-procedure imaging showed a large right MCA territory CVA and a distal right M2 occlusion. Hospital course was complicated by agitation, restlessness, and need for intubation. Following extubation on 10/11/2023, an aspiration event occurred on 10/12/2023, necessitating NG tube placement and dietary modifications. Admitted to CIR for significant functional deficits.  PAST MEDICAL HISTORY: - End-stage renal disease (on hemodialysis Tuesday, Thursday, Saturday) - Systolic and diastolic congestive heart failure - Anemia - Hypertension - Obesity  SOCIAL HISTORY: Was working independently prior to admission.  BEHAVIORAL OBSERVATIONS AND MENTAL STATUS: Seen for brief bedside consultation. Patient was in wheelchair RN present.  Was able to state his name. Speech was dysarthric but  intelligible. Seemed to have limited understanding of recent medical events. Responses to questions were brief and sometimes tangential. Acknowledged awareness of and compliance with dietary restrictions. Family member reported that he becomes weak and dizzy upon standing and noted a recent episode of unresponsiveness, which coincided with a drop in blood pressure.  CURRENT COMPLAINTS: - Reported an episode of vomiting today. Denies vomiting yesterday. - Reports generalized pruritus, which is described as longstanding (not new). - Family member reports episodes of weakness and dizziness on standing.  ASSESSMENT AND PLAN: Brief bedside neuropsychological consultation completed due to patient N/V. Patient demonstrates significant neurological and cognitive deficits secondary to recent right MCA stroke. Awareness of his deficits appears limited. Current concerns include new-onset vomiting and ongoing pruritus.  1. Will discuss new-onset emesis and generalized pruritus with Dr. Carilyn to rule out medication side effects or other acute medical causes. 2. Will follow up with Dr. Carilyn regarding the family's report of orthostatic weakness, dizziness, and unresponsiveness. 3. Continue to monitor cognitive and functional status. 4. Provide support to patient and family as needed.   Medical History:   Past Medical History:  Diagnosis Date   Acute combined systolic and diastolic CHF, NYHA class 4 (HCC) 06/10/2016   Anemia    Cardiomyopathy (HCC) 06/14/2016   CHF (congestive heart failure) (HCC)    COVID 10/2020   Dyspnea    ESRD on hemodialysis (HCC)    TTS at [facility]   Hypertension    Hypertensive heart and kidney disease with heart failure (HCC) 06/14/2016   Hypertensive heart disease with congestive heart failure (HCC)    Obesity          Patient Active Problem List   Diagnosis Date Noted  Acute hypoxic respiratory failure (HCC) 10/17/2023   Essential hypertension 10/17/2023    Dysphagia 10/17/2023   Leukocytosis 10/17/2023   Obesity 10/17/2023   Acute ischemic stroke (HCC) 10/07/2023   Middle cerebral artery embolism, right 10/07/2023   ESRD (end stage renal disease) (HCC) 03/26/2022   Hyperlipidemia, unspecified 03/20/2021   Acute gout due to renal impairment involving left wrist 04/24/2019   Malignant hypertension 10/14/2016   Combined congestive systolic and diastolic heart failure (HCC) 06/14/2016   NICM (nonischemic cardiomyopathy) (HCC) 06/14/2016   Renal failure    Hypertensive urgency 06/10/2016   CKD (chronic kidney disease), stage IV (HCC) 06/10/2016   Normocytic anemia 06/10/2016            Electronically Signed   _______________________ Norleen Asa, Psy.D. Clinical Neuropsychologist

## 2023-10-24 NOTE — Plan of Care (Signed)
   Problem: Consults Goal: RH STROKE PATIENT EDUCATION Description: See Patient Education module for education specifics  Outcome: Progressing   Problem: RH BOWEL ELIMINATION Goal: RH STG MANAGE BOWEL WITH ASSISTANCE Description: STG Manage Bowel with mod I Assistance. Outcome: Progressing Goal: RH STG MANAGE BOWEL W/MEDICATION W/ASSISTANCE Description: STG Manage Bowel with Medication with mod I Assistance. Outcome: Progressing   Problem: RH SAFETY Goal: RH STG ADHERE TO SAFETY PRECAUTIONS W/ASSISTANCE/DEVICE Description: STG Adhere to Safety Precautions With cues Assistance/Device. Outcome: Progressing   Problem: RH PAIN MANAGEMENT Goal: RH STG PAIN MANAGED AT OR BELOW PT'S PAIN GOAL Description: Pain < 4 with prns Outcome: Progressing   Problem: RH KNOWLEDGE DEFICIT Goal: RH STG INCREASE KNOWLEDGE OF HYPERTENSION Description: Patient and family will be able to manage HTN using educational resources for medications and dietary modification independently Outcome: Progressing Goal: RH STG INCREASE KNOWLEDGE OF DYSPHAGIA/FLUID INTAKE Description: Patient and family will be able to manage dysphagia using educational resources for medications and dietary modification independently Outcome: Progressing Goal: RH STG INCREASE KNOWLEGDE OF HYPERLIPIDEMIA Description: Patient and family will be able to manage HLD using educational resources for medications and dietary modification independently Outcome: Progressing Goal: RH STG INCREASE KNOWLEDGE OF STROKE PROPHYLAXIS Description: Patient and family will be able to manage secondary risks using educational resources for medications and dietary modification independently Outcome: Progressing

## 2023-10-24 NOTE — Progress Notes (Signed)
 Physical Therapy Session Note  Patient Details  Name: Nathan Campbell MRN: [id number] Date of Birth: 22-Sep-1975  Today's Date: 10/24/2023 PT Individual Time: 8954-8844 PT Individual Time Calculation (min): 70 min   Short Term Goals: Week 1:  PT Short Term Goal 1 (Week 1): Pt will demosntrate bed mobility with CGA and good technique. PT Short Term Goal 2 (Week 1): pt will perform standing transfers with consistent SBA/ CGA. PT Short Term Goal 3 (Week 1): Pt will perform ambulation up to 100 ft using LRAD with CGA. PT Short Term Goal 4 (Week 1): Pt will demonstrate stair navigation up /down at least 8 steps with overall MinA. PT Short Term Goal 5 (Week 1): Pt will complete appropriate outcome measure.  Skilled Therapeutic Interventions/Progress Updates:      Pt sleeping heavily in bed - somewhat difficult to waken and needs loud voice and lights on to wake up. His mother entering room and assisting with getting patient to wake up and participate in therapy.   Bed mobility completed at supervision level. Stand pivot transfer with CGA from EOB into wheelchair with cues for hand placement and general safety awareness.  Transported to main gym to work on Museum/gallery curator, gait training, and balance training. Pt ambulates with QC and CGA >158ft in rehab spaces with min cues for sequencing, attending to his L, and general stability cues. Stair training completed with various heights 3 vs 6 with 1 hand rail and CGA with a step-to pattern for all heights. Pt able to navigate x8 6 steps and x12 3 steps.   Dynamic balance training by playing basketball in unsupported standing position - patient picking up the ball from ground level with CGA without LOB, able to take steps backwards/sideways/forwards with CGA during activity. Played a game of H.O.R.S.E while working on attention and recall of score.   Dynamic gait training completed with step ups, single leg toe taps to cones, and eyes closed standing  on blue air-ex pad. minA overall for stability and balance during dynamic gait training. Pt having difficulty with dual-cog multi-tasking and attending to his L during this activity.   Finished session on seated Nustep with L3 resistance while using BLE only to isolate for strengthening and cardiovascular endurance. Used [employer] to help with attention and sustained cadence of 80spm. Pt needing several intermittent rest breaks due to fatigue, and encouragement to complete task.   Pt returned to his room at the completion of session. He was left sitting up in wheelchair with seat belt al ram on and 1/2 lap supporting LUE. Call bell within reach.    Therapy Documentation Precautions:  Precautions Precautions: Fall Precaution/Restrictions Comments: L hemipareisis UE>LE, L hemianopsia, cortrak, watch SpO2 and RR Restrictions Weight Bearing Restrictions Per Provider Order: No General:  Therapy/Group: Individual Therapy  India Jolin P Jenasia Dolinar 10/24/2023, 8:02 AM

## 2023-10-24 NOTE — Discharge Summary (Addendum)
 Physician Discharge Summary  Patient ID: Akiel Fennell MRN: [id number] DOB/AGE: 16-Jan-1976 48 y.o.  Admit date: 10/18/2023 Discharge date: 11/04/2023  Discharge Diagnoses:  Principal Problem:   Middle cerebral artery embolism, right Active Problems:   Cognitive and neurobehavioral dysfunction End stage renal disease  Aspirational pneumonia  Congestive heart failure  Hypertension  Right MCA infarct  Chronic anemia  Hyperlipidemia  Obesity  Hyperkalemia  Hyperphosphatemia  Acute respiratory failure  Dysphagia      Discharged Condition: stable  Significant Diagnostic Studies: DG Swallowing Func-Speech Pathology Result Date: 11/03/2023 Table formatting from the original result was not included. Modified Barium Swallow Study Patient Details Name: Jacey Pelc MRN: [id number] Date of Birth: [birth year] Today's Date: 11/03/2023 HPI/PMH: HPI: Pt is a 48 y/o male who presented 10/07/23 with left-sided weakness and slurred speech. MRI brain 7/26: Large right MCA territory infarct with associated diffuse cortical thickening and some mass effect. Pt s/p TNK & IR revascularization. Intubated 7/25-7/29. 7/29 ileus. PMH: CHF, CM, ESRD on HD TTS, HTN, obesity, HLD Clinical Impression: Patient presents with mild oropharyngeal dysphagia. Oral phase is characterized by lingual weakness and deviation resulting in need for occasional lingual pumping to achieve posterior transit. Patient with mild oral residuals, cleared through additional swallows. Pharyngeal phase is remarkable for delayed swallow initiation resulting in pooling of bolus in the vallecula and/or pyriform sinses before hyoid burst. This is seemingly improved since initial MBS, though still present. Patient with x1 instance of stagnant penetration of thin liquids and occasional transient penetration before/during swallow due to spill of bolus over epiglottis. No other penetration/aspiration present across all consistencies. Recommend  initiation of dysphagia 3 diet with thin liquids. Patient would benefit from occaisonal double swallows to clear any oral/pharyngeal residuals. Medications may be taken whole in thin liquids. DIGEST Swallow Severity Rating*  Safety: 1  Efficiency: 0  Overall Pharyngeal Swallow Severity: mild 1: mild; 2: moderate; 3: severe; 4: profound *The Dynamic Imaging Grade of Swallowing Toxicity is standardized for the head and neck cancer population, however, demonstrates promising clinical applications across populations to standardize the clinical rating of pharyngeal swallow safety and severity. Factors that may increase risk of adverse event in presence of aspiration Noe & Lianne 2021): Factors that may increase risk of adverse event in presence of aspiration Noe & Lianne 2021): Reduced cognitive function Recommendations/Plan: Swallowing Evaluation Recommendations Swallowing Evaluation Recommendations Recommendations: PO diet PO Diet Recommendation: Dysphagia 3 (Mechanical soft); Thin liquids (Level 0) Liquid Administration via: Cup; Straw Medication Administration: Whole meds with liquid Supervision: Staff to assist with self-feeding Swallowing strategies  : Minimize environmental distractions; Slow rate; Small bites/sips; Clear throat intermittently Postural changes: Position pt fully upright for meals Oral care recommendations: Oral care BID (2x/day) Treatment Plan Treatment Plan Treatment recommendations: Therapy as outlined in treatment plan below Follow-up recommendations: Home health SLP Functional status assessment: Patient has had a recent decline in their functional status and demonstrates the ability to make significant improvements in function in a reasonable and predictable amount of time. Treatment frequency: Min 2x/week Treatment duration: 3 weeks Interventions: Patient/family education; Diet toleration management by SLP; Aspiration precaution training; Compensatory techniques Recommendations  Recommendations for follow up therapy are one component of a multi-disciplinary discharge planning process, led by the attending physician.  Recommendations may be updated based on patient status, additional functional criteria and insurance authorization. Assessment: Orofacial Exam: Orofacial Exam Oral Cavity: Oral Hygiene: Pooled secretions Oral Cavity - Dentition: Adequate natural dentition Orofacial Anatomy: WFL Oral Motor/Sensory Function: Suspected cranial nerve impairment CN  VII - Facial: Left motor impairment CN XII - Hypoglossal: Left motor impairment Anatomy: Anatomy: WFL Boluses Administered: Boluses Administered Boluses Administered: Thin liquids (Level 0); Puree; Solid  Oral Impairment Domain: Oral Impairment Domain Lip Closure: No labial escape Tongue control during bolus hold: Cohesive bolus between tongue to palatal seal Bolus preparation/mastication: Slow prolonged chewing/mashing with complete recollection Bolus transport/lingual motion: Slow tongue motion; Repetitive/disorganized tongue motion Oral residue: Residue collection on oral structures Location of oral residue : Palate; Tongue Initiation of pharyngeal swallow : Pyriform sinuses  Pharyngeal Impairment Domain: Pharyngeal Impairment Domain Soft palate elevation: No bolus between soft palate (SP)/pharyngeal wall (PW) Laryngeal elevation: Complete superior movement of thyroid cartilage with complete approximation of arytenoids to epiglottic petiole Anterior hyoid excursion: Complete anterior movement Epiglottic movement: Complete inversion Laryngeal vestibule closure: Complete, no air/contrast in laryngeal vestibule Pharyngeal stripping wave : Present - complete Pharyngoesophageal segment opening: Complete distension and complete duration, no obstruction of flow Tongue base retraction: No contrast between tongue base and posterior pharyngeal wall (PPW) Pharyngeal residue: Complete pharyngeal clearance  Esophageal Impairment Domain: Esophageal  Impairment Domain Esophageal clearance upright position: Complete clearance, esophageal coating Pill: Pill Consistency administered: Thin liquids (Level 0) Thin liquids (Level 0): [facility] Penetration/Aspiration Scale Score: Penetration/Aspiration Scale Score 1.  Material does not enter airway: Puree; Solid; Pill 3.  Material enters airway, remains ABOVE vocal cords and not ejected out: Thin liquids (Level 0) Compensatory Strategies: Compensatory Strategies Compensatory strategies: Yes Multiple swallows: Effective Effective Multiple Swallows: Thin liquid (Level 0)   General Information: Caregiver present: No  Diet Prior to this Study: Dysphagia 3 (mechanical soft); Mildly thick liquids (Level 2, nectar thick)   No data recorded  No data recorded  Supplemental O2: None (Room air)   No data recorded Behavior/Cognition: Alert; Cooperative Self-Feeding Abilities: Needs set-up for self-feeding No data recorded Volitional Cough: Able to elicit Volitional Swallow: Able to elicit Exam Limitations: No limitations Goal Planning: Prognosis for improved oropharyngeal function: Good No data recorded No data recorded Patient/Family Stated Goal: thin liquids Consulted and agree with results and recommendations: Patient; Nurse Pain: No data recorded End of Session: Start Time:SLP Start Time (ACUTE ONLY): 0900 Stop Time: SLP Stop Time (ACUTE ONLY): 0915 Time Calculation:SLP Time Calculation (min) (ACUTE ONLY): 15 min Charges: SLP Evaluations $ SLP Speech Visit: 1 Visit SLP Evaluations $MBS Swallow: 1 Procedure SLP visit diagnosis: SLP Visit Diagnosis: Dysphagia, oropharyngeal phase (R13.12) Past Medical History: Past Medical History: Diagnosis Date  Acute combined systolic and diastolic CHF, NYHA class 4 (HCC) 06/10/2016  Anemia   Cardiomyopathy (HCC) 06/14/2016  CHF (congestive heart failure) (HCC)   COVID 10/2020  Dyspnea   ESRD on hemodialysis (HCC)   TTS at [facility]  Hypertension   Hypertensive heart and kidney disease with heart  failure (HCC) 06/14/2016  Hypertensive heart disease with congestive heart failure (HCC)   Obesity  Past Surgical History: Past Surgical History: Procedure Laterality Date  A/V FISTULAGRAM Left 05/03/2022  Procedure: A/V Fistulagram;  Surgeon: Eliza Lonni RAMAN, MD;  Location: [facility] INVASIVE CV LAB;  Service: Cardiovascular;  Laterality: Left;  AV FISTULA PLACEMENT Left 11/11/2020  Procedure: LEFT ARM First Stage Basilic Vein Fistula Creation;  Surgeon: Sheree Penne Lonni, MD;  Location: [facility] OR;  Service: Vascular;  Laterality: Left;  BASCILIC VEIN TRANSPOSITION Left 01/23/2021  Procedure: Second Stage Basilic Vein Transposition of Left Arm Arteriovenous  Fistula;  Surgeon: Sheree Penne Lonni, MD;  Location[facility] OR;  Service: Vascular;  Laterality: Left;  Block  to LMA  COLONOSCOPY    FISTULA SUPERFICIALIZATION Left 05/07/2022  Procedure: PLICATION OF ANEURYSM, LEFT ARM FISTULA;  Surgeon: Eliza Lonni RAMAN, MD;  Location: [facility] OR;  Service: Vascular;  Laterality: Left;  IR CT HEAD LTD  10/07/2023  IR CT HEAD LTD  10/07/2023  IR PATIENT EVAL TECH 0-60 MINS  10/07/2023  IR PERCUTANEOUS ART THROMBECTOMY/INFUSION INTRACRANIAL INC DIAG ANGIO  10/07/2023  NO PAST SURGERIES    PERIPHERAL VASCULAR BALLOON ANGIOPLASTY Left 05/03/2022  Procedure: PERIPHERAL VASCULAR BALLOON ANGIOPLASTY;  Surgeon: Eliza Lonni RAMAN, MD;  Location: [facility] INVASIVE CV LAB;  Service: Cardiovascular;  Laterality: Left;  AVF  RADIOLOGY WITH ANESTHESIA N/A 10/07/2023  Procedure: RADIOLOGY WITH ANESTHESIA;  Surgeon: Radiologist, Medication, MD;  Location: [facility] OR;  Service: Radiology;  Laterality: N/A; Cassidi F Sockwell 11/03/2023, 9:36 AM  CT CHEST WO CONTRAST Result Date: 10/19/2023 CLINICAL DATA:  Pneumonia, complication suspected, xray done EXAM: CT CHEST WITHOUT CONTRAST TECHNIQUE: Multidetector CT imaging of the chest was performed following the standard protocol without IV contrast. RADIATION DOSE REDUCTION: This exam was performed  according to the departmental dose-optimization program which includes automated exposure control, adjustment of the mA and/or kV according to patient size and/or use of iterative reconstruction technique. COMPARISON:  Chest x-ray 10/18/2023, chest x-ray 10/12/2023 FINDINGS: Cardiovascular: Normal heart size. No significant pericardial effusion. The thoracic aorta is normal in caliber. No atherosclerotic plaque of the thoracic aorta. No coronary artery calcifications. Mediastinum/Nodes: No gross hilar adenopathy, noting limited sensitivity for the detection of hilar adenopathy on this noncontrast study. No enlarged mediastinal or axillary lymph nodes. Thyroid gland, trachea, and esophagus demonstrate no significant findings. Lungs/Pleura: No focal consolidation. No pulmonary nodule. No pulmonary mass. No pleural effusion. No pneumothorax. Upper Abdomen: Enteric tube with tip terminating in the lumen of the gastric antrum just proximal to the pylorus. Left adrenal gland nodule measuring 2.3 cm with a density of -84 Hounsfield units consistent with myelolipoma, no further follow-up indicated. Musculoskeletal: No chest wall abnormality.  Bilateral gynecomastia. No suspicious lytic or blastic osseous lesions. No acute displaced fracture. IMPRESSION: No acute intrathoracic abnormality with limited evaluation on this noncontrast study. Electronically Signed   By: Morgane  Naveau M.D.   On: 10/19/2023 22:38   DG Chest 2 View Result Date: 10/18/2023 CLINICAL DATA:  Ischemic stroke. EXAM: CHEST - 2 VIEW COMPARISON:  Chest radiograph dated 10/17/2023. FINDINGS: Evaluation is limited due to body habitus. Enteric tube extends below the diaphragm with tip beyond the inferior margin of the image. There is cardiomegaly with vascular congestion. Evaluation of the previously described left lung base density is limited on the lateral view due to body habitus. No large pleural effusion or pneumothorax. No acute osseous pathology.  IMPRESSION: 1. Cardiomegaly with vascular congestion. 2. Evaluation of the previously described left lung base density is limited on the lateral view due to body habitus. Electronically Signed   By: Vanetta Chou M.D.   On: 10/18/2023 19:35   DG CHEST PORT 1 VIEW Result Date: 10/17/2023 CLINICAL DATA:  Shortness of breath. EXAM: PORTABLE CHEST 1 VIEW COMPARISON:  10/12/2023. FINDINGS: Trachea is midline. Heart is enlarged, stable. Thoracic aorta is calcified. Feeding tube is followed into the stomach with the tip projecting beyond the inferior margin of the image. Contrast is seen in the stomach in association. Lungs are somewhat low in volume. Vague airspace consolidation in the left lower lobe and lingula. IMPRESSION: Vague lingular and left lower lobe airspace opacification may be due to pneumonia. Followup PA and lateral chest X-ray  is recommended in 3-4 weeks following trial of antibiotic therapy to ensure resolution and exclude underlying malignancy. Electronically Signed   By: Newell Eke M.D.   On: 10/17/2023 18:15   DG Swallowing Func-Speech Pathology Result Date: 10/17/2023 Table formatting from the original result was not included. Modified Barium Swallow Study Patient Details Name: Leray Garverick MRN: [id number] Date of Birth: 17-Mar-1975 Today's Date: 10/17/2023 HPI/PMH: HPI: Pt is a 48 y/o male who presented 10/07/23 with left-sided weakness and slurred speech. MRI brain 7/26: Large right MCA territory infarct with associated diffuse cortical thickening and some mass effect. Pt s/p TNK & IR revascularization. Intubated 7/25-7/29. 7/29 ileus. PMH: CHF, CM, ESRD on HD TTS, HTN, obesity, HLD Clinical Impression: Clinical Impression: Pt demonstrates a moderate oral dysphagia as well as late initiation of swallowing. Pt has severe left lingual weakness, tongue deviated left, pooled oral secretions spilling on the left and causing coughing prior to starting exam. Despite this pt able to achieve  posterior transit of each texture with repetitive slow posterior lingual propulsion and minimal anterior spillage of PO. Able to use a straw. Pt does not form a bolus and only mashes solids with tongue to palate, cannot manipulate for mastication. All boluses pool for several seconds in the pyriform sinsues prior to hyoid burst. Pt had only one instance of aspiration of thin liquids as swallow initiated. No sensation. Recommend initiating a puree diet and nectar thick liquids. Suspect pts secretion management may improve with oral intake and may be easily advance to thin liquids or improved solids. Pt will need pills crushed in puree as he could not orally transit barium tablet. Factors that may increase risk of adverse event in presence of aspiration Noe & Lianne 2021): No data recorded Recommendations/Plan: Swallowing Evaluation Recommendations Swallowing Evaluation Recommendations Recommendations: PO diet PO Diet Recommendation: Dysphagia 1 (Pureed); Mildly thick liquids (Level 2, nectar thick) Liquid Administration via: Cup; Straw Medication Administration: Crushed with puree Supervision: Staff to assist with self-feeding Oral care recommendations: Use suctioning for oral care Caregiver Recommendations: Have oral suction available Treatment Plan Treatment Plan Treatment recommendations: Therapy as outlined in treatment plan below Follow-up recommendations: Acute inpatient rehab (3 hours/day) Treatment frequency: Min 2x/week Treatment duration: 2 weeks Interventions: Patient/family education; Trials of upgraded texture/liquids; Diet toleration management by SLP Recommendations Recommendations for follow up therapy are one component of a multi-disciplinary discharge planning process, led by the attending physician.  Recommendations may be updated based on patient status, additional functional criteria and insurance authorization. Assessment: Orofacial Exam: Orofacial Exam Oral Cavity: Oral Hygiene: Pooled  secretions Oral Cavity - Dentition: Adequate natural dentition Orofacial Anatomy: WFL Oral Motor/Sensory Function: Suspected cranial nerve impairment CN V - Trigeminal: Not tested CN VII - Facial: Left motor impairment CN IX - Glossopharyngeal, CN X - Vagus: Not tested CN XII - Hypoglossal: Left motor impairment Anatomy: Anatomy: WFL Boluses Administered: Boluses Administered Boluses Administered: Thin liquids (Level 0); Mildly thick liquids (Level 2, nectar thick); Puree; Solid  Oral Impairment Domain: Oral Impairment Domain Lip Closure: Interlabial escape, no progression to anterior lip Tongue control during bolus hold: Posterior escape of greater than half of bolus Bolus preparation/mastication: Minimal chewing/mashing with majority of bolus unchewed Bolus transport/lingual motion: Slow tongue motion Oral residue: Trace residue lining oral structures Location of oral residue : Lateral sulci Initiation of pharyngeal swallow : Pyriform sinuses  Pharyngeal Impairment Domain: Pharyngeal Impairment Domain Soft palate elevation: No bolus between soft palate (SP)/pharyngeal wall (PW) Laryngeal elevation: Complete superior movement of thyroid cartilage  with complete approximation of arytenoids to epiglottic petiole Anterior hyoid excursion: Complete anterior movement Epiglottic movement: Complete inversion Laryngeal vestibule closure: Complete, no air/contrast in laryngeal vestibule Pharyngeal stripping wave : Present - complete Pharyngeal contraction (A/P view only): Complete Pharyngoesophageal segment opening: Complete distension and complete duration, no obstruction of flow Tongue base retraction: No contrast between tongue base and posterior pharyngeal wall (PPW) Pharyngeal residue: Complete pharyngeal clearance  Esophageal Impairment Domain: No data recorded Pill: No data recorded Penetration/Aspiration Scale Score: Penetration/Aspiration Scale Score 1.  Material does not enter airway: Mildly thick liquids (Level 2,  nectar thick); Puree; Solid 8.  Material enters airway, passes BELOW cords without attempt by patient to eject out (silent aspiration) : Thin liquids (Level 0) Compensatory Strategies: No data recorded  General Information: Caregiver present: No  Diet Prior to this Study: NPO; Cortrak/Small bore NG tube   No data recorded  No data recorded  Supplemental O2: None (Room air)   History of Recent Intubation: Yes  Behavior/Cognition: Alert; Cooperative Self-Feeding Abilities: Dependent for feeding Baseline vocal quality/speech: Abnormal resonance Volitional Cough: Able to elicit Volitional Swallow: Able to elicit No data recorded Goal Planning: Prognosis for improved oropharyngeal function: Good No data recorded No data recorded Patient/Family Stated Goal: none stated No data recorded Pain: Pain Assessment Pain Assessment: No/denies pain Pain Intervention(s): Monitored during session End of Session: Start Time:SLP Start Time (ACUTE ONLY): 1150 Stop Time: SLP Stop Time (ACUTE ONLY): 1212 Time Calculation:SLP Time Calculation (min) (ACUTE ONLY): 22 min Charges: SLP Evaluations $ SLP Speech Visit: 1 Visit SLP Evaluations $BSS Swallow: 1 Procedure $MBS Swallow: 1 Procedure $Speech Treatment for Individual: 1 Procedure SLP visit diagnosis: SLP Visit Diagnosis: Dysphagia, oropharyngeal phase (R13.12) Past Medical History: Past Medical History: Diagnosis Date  Acute combined systolic and diastolic CHF, NYHA class 4 (HCC) 06/10/2016  Anemia   Cardiomyopathy (HCC) 06/14/2016  CHF (congestive heart failure) (HCC)   COVID 10/2020  Dyspnea   ESRD on hemodialysis (HCC)   TTS at [facility]  Hypertension   Hypertensive heart and kidney disease with heart failure (HCC) 06/14/2016  Hypertensive heart disease with congestive heart failure (HCC)   Obesity  Past Surgical History: Past Surgical History: Procedure Laterality Date  A/V FISTULAGRAM Left 05/03/2022  Procedure: A/V Fistulagram;  Surgeon: Eliza Lonni RAMAN, MD;  Location: [facility]  INVASIVE CV LAB;  Service: Cardiovascular;  Laterality: Left;  AV FISTULA PLACEMENT Left 11/11/2020  Procedure: LEFT ARM First Stage Basilic Vein Fistula Creation;  Surgeon: Sheree Penne Lonni, MD;  Location: [facility] OR;  Service: Vascular;  Laterality: Left;  BASCILIC VEIN TRANSPOSITION Left 01/23/2021  Procedure: Second Stage Basilic Vein Transposition of Left Arm Arteriovenous  Fistula;  Surgeon: Sheree Penne Lonni, MD;  Location: [facility] OR;  Service: Vascular;  Laterality: Left;  Block  to LMA   COLONOSCOPY    FISTULA SUPERFICIALIZATION Left 05/07/2022  Procedure: PLICATION OF ANEURYSM, LEFT ARM FISTULA;  Surgeon: Eliza Lonni RAMAN, MD;  Location: [facility] OR;  Service: Vascular;  Laterality: Left;  IR CT HEAD LTD  10/07/2023  IR CT HEAD LTD  10/07/2023  IR PATIENT EVAL TECH 0-60 MINS  10/07/2023  IR PERCUTANEOUS ART THROMBECTOMY/INFUSION INTRACRANIAL INC DIAG ANGIO  10/07/2023  NO PAST SURGERIES    PERIPHERAL VASCULAR BALLOON ANGIOPLASTY Left 05/03/2022  Procedure: PERIPHERAL VASCULAR BALLOON ANGIOPLASTY;  Surgeon: Eliza Lonni RAMAN, MD;  Location: [facility] INVASIVE CV LAB;  Service: Cardiovascular;  Laterality: Left;  AVF  RADIOLOGY WITH ANESTHESIA N/A 10/07/2023  Procedure: RADIOLOGY WITH ANESTHESIA;  Surgeon:  Radiologist, Medication, MD;  Location: [facility] OR;  Service: Radiology;  Laterality: N/A; Consuelo Fort, MA CCC-SLP Acute Rehabilitation Services Secure Chat Preferred Office [phone] [place] 10/17/2023, 1:02 PM  DG Abd Portable 1V Result Date: 10/14/2023 CLINICAL DATA:  [id number] Encounter for feeding tube placement [id number] EXAM: PORTABLE ABDOMEN - 1 VIEW COMPARISON:  10/12/2023 abdomen radiograph FINDINGS: Enteric tube courses into the distal stomach with weighted tip in the medial right abdomen likely in the region of the pylorus. No evidence of pneumoperitoneum. IMPRESSION: Enteric tube courses into the distal stomach with weighted tip in the medial right abdomen likely in the region of the  pylorus. Electronically Signed   By: Selinda DELENA Blue M.D.   On: 10/14/2023 10:04   IR PERCUTANEOUS ART THROMBECTOMY/INFUSION INTRACRANIAL INC DIAG ANGIO Result Date: 10/12/2023 INDICATION: New onset right gaze deviation, left-sided hemiplegia. Occluded right middle cerebral artery M1 segment on CT angiogram of the head and neck. EXAM: 1. EMERGENT LARGE VESSEL OCCLUSION THROMBOLYSIS (anterior CIRCULATION) COMPARISON:  CT angiogram of the head and neck of October 07, 2023. MEDICATIONS: No antibiotic was administered within 1 hour of the procedure. ANESTHESIA/SEDATION: General anesthesia. CONTRAST:  Omnipaque  300 approximately 115 cc. FLUOROSCOPY TIME:  Fluoroscopy Time: 41 minutes 24 seconds (29 mGy). COMPLICATIONS: None immediate. TECHNIQUE: Following a full explanation of the procedure along with the potential associated complications, an informed witnessed consent was obtained. The risks of intracranial hemorrhage of 10%, worsening neurological deficit, ventilator dependency, death and inability to revascularize were all reviewed in detail with the patient's daughter. The patient was then put under general anesthesia by the Department of Anesthesiology at [facility]. The right groin was prepped and draped in the usual sterile fashion. Thereafter using modified Seldinger technique, transfemoral access into the right common femoral artery was obtained without difficulty. Over an 0.035 inch guidewire an 8 French 25 cm [place] Pinnacle sheath was inserted. Through this, and also over an 0.035 inch guidewire a combination of a 125 cm 6 French Simmons 2 support catheter inside of a 100 cm 088 Zoom catheter was advanced to the aortic arch region, and selectively positioned in the right common carotid artery and then the distal right internal carotid artery in the proximal cavernous segment. FINDINGS: Right common carotid arteriogram demonstrates the right external carotid artery and its major branches to be widely  patent. The right internal carotid artery at the bulb to the cranial skull base demonstrates patency with moderate to moderately severe tortuosity in the proximal 1/3. The petrous, the cavernous and the supraclinoid right ICA demonstrate wide patency with a double U shaped tortuosity of the petrous and cavernous segments extending into the distal cavernous segment. Complete occlusion was evident of the right middle cerebral artery in the proximal M1 segment. Right anterior cerebral artery opacifies into the capillary and venous phases. PROCEDURE: Through the 088 support catheter in the petrous cavernous junction, the combination of an 071 135 cm Zoom aspiration catheter with an 043 Penumbra distal aspiration catheter was advanced over an 018 inch Aristotle micro guidewire with a moderate J configuration. The combination was navigated with modest difficulty due to the inherent tortuosity to the caval cavernous segment. The micro guidewire was then advanced into the right M1 segment followed by the 043 aspiration catheter followed by the 071 aspiration catheter which could only be advanced to just the origin of the right middle cerebral artery due to the severe tortuosity. The micro guidewire was then advanced into the M2 segment of the superior division  followed by the 043 aspiration catheter. The micro guidewire was removed followed by aspiration at the hub of the 043 distal aspiration catheter and also the 071 Zoom aspiration catheter. After a minute, the inner 043 aspiration catheter was removed followed by the 071 aspiration catheter 2 minutes later. A control arteriogram performed through the Zoom support catheter demonstrated revascularization of the right M1 segment and the superior division into the distal M2 segments. The inferior division remained occluded. A second pass was then made using the combination of an 021 162 cm microcatheter advanced inside of an 062 Penumbra aspiration catheter. Over an 018  inch Aristotle micro guidewire, the combination was advanced without difficulty to the M1 segment. The micro guidewire was maneuvered into the inferior division distal M2 region followed by the microcatheter. The guidewire was removed. Good aspiration obtained from the hub of the microcatheter which was then connected to continuous heparinized saline infusion. A 4 mm x 40 mm Solitaire X retrieval device was then deployed in the usual manner. The 062 aspiration catheter was advanced into the proximal inferior division. Aspiration was applied at the hub of the 062 aspiration catheter for approximately 3 minutes. Thereafter the combination was retrieved and removed. A control angiogram performed through the 088 Zoom aspiration catheter demonstrated complete revascularization of the superior division and also the inferior division to the distal M2 region. A TICI 2B plus revascularization was achieved. Distal branch of the inferior division extending into the posterior frontal anterior parietal region remained occluded. Given the distal nature of the occlusion, aspiration was not attempted. A final control arteriogram performed through the 088 Zoom catheter in the distal right common carotid artery revealed patency of the intracranial and extracranial portion of the right ICA. The right anterior cerebral artery remained widely patent. The right middle cerebral artery continued to demonstrate a TICI 2B plus revascularization. An 8 French Angio-Seal closure device was deployed at the right groin puncture site. Distal pulses remained present unchanged from prior to the procedure. A flat panel CT of the brain revealed no gross intracranial hemorrhage. The patient was left intubated due to his medical condition and transferred to the neuro ICU for post revascularization care. IMPRESSION: Status post endovascular complete revascularization of the right middle cerebral M1 segment, of the superior division, and partially the  inferior division achieving a TICI 2B plus revascularization. PLAN: As per Stroke Neurology. Electronically Signed   By: Thyra Nash M.D.   On: 10/12/2023 08:20   IR CT Head Ltd Result Date: 10/12/2023 INDICATION: New onset right gaze deviation, left-sided hemiplegia. Occluded right middle cerebral artery M1 segment on CT angiogram of the head and neck. EXAM: 1. EMERGENT LARGE VESSEL OCCLUSION THROMBOLYSIS (anterior CIRCULATION) COMPARISON:  CT angiogram of the head and neck of October 07, 2023. MEDICATIONS: No antibiotic was administered within 1 hour of the procedure. ANESTHESIA/SEDATION: General anesthesia. CONTRAST:  Omnipaque  300 approximately 115 cc. FLUOROSCOPY TIME:  Fluoroscopy Time: 41 minutes 24 seconds (29 mGy). COMPLICATIONS: None immediate. TECHNIQUE: Following a full explanation of the procedure along with the potential associated complications, an informed witnessed consent was obtained. The risks of intracranial hemorrhage of 10%, worsening neurological deficit, ventilator dependency, death and inability to revascularize were all reviewed in detail with the patient's daughter. The patient was then put under general anesthesia by the Department of Anesthesiology at [facility]. The right groin was prepped and draped in the usual sterile fashion. Thereafter using modified Seldinger technique, transfemoral access into the right common femoral artery  was obtained without difficulty. Over an 0.035 inch guidewire an 8 French 25 cm [place] Pinnacle sheath was inserted. Through this, and also over an 0.035 inch guidewire a combination of a 125 cm 6 French Simmons 2 support catheter inside of a 100 cm 088 Zoom catheter was advanced to the aortic arch region, and selectively positioned in the right common carotid artery and then the distal right internal carotid artery in the proximal cavernous segment. FINDINGS: Right common carotid arteriogram demonstrates the right external carotid artery and  its major branches to be widely patent. The right internal carotid artery at the bulb to the cranial skull base demonstrates patency with moderate to moderately severe tortuosity in the proximal 1/3. The petrous, the cavernous and the supraclinoid right ICA demonstrate wide patency with a double U shaped tortuosity of the petrous and cavernous segments extending into the distal cavernous segment. Complete occlusion was evident of the right middle cerebral artery in the proximal M1 segment. Right anterior cerebral artery opacifies into the capillary and venous phases. PROCEDURE: Through the 088 support catheter in the petrous cavernous junction, the combination of an 071 135 cm Zoom aspiration catheter with an 043 Penumbra distal aspiration catheter was advanced over an 018 inch Aristotle micro guidewire with a moderate J configuration. The combination was navigated with modest difficulty due to the inherent tortuosity to the caval cavernous segment. The micro guidewire was then advanced into the right M1 segment followed by the 043 aspiration catheter followed by the 071 aspiration catheter which could only be advanced to just the origin of the right middle cerebral artery due to the severe tortuosity. The micro guidewire was then advanced into the M2 segment of the superior division followed by the 043 aspiration catheter. The micro guidewire was removed followed by aspiration at the hub of the 043 distal aspiration catheter and also the 071 Zoom aspiration catheter. After a minute, the inner 043 aspiration catheter was removed followed by the 071 aspiration catheter 2 minutes later. A control arteriogram performed through the Zoom support catheter demonstrated revascularization of the right M1 segment and the superior division into the distal M2 segments. The inferior division remained occluded. A second pass was then made using the combination of an 021 162 cm microcatheter advanced inside of an 062 Penumbra  aspiration catheter. Over an 018 inch Aristotle micro guidewire, the combination was advanced without difficulty to the M1 segment. The micro guidewire was maneuvered into the inferior division distal M2 region followed by the microcatheter. The guidewire was removed. Good aspiration obtained from the hub of the microcatheter which was then connected to continuous heparinized saline infusion. A 4 mm x 40 mm Solitaire X retrieval device was then deployed in the usual manner. The 062 aspiration catheter was advanced into the proximal inferior division. Aspiration was applied at the hub of the 062 aspiration catheter for approximately 3 minutes. Thereafter the combination was retrieved and removed. A control angiogram performed through the 088 Zoom aspiration catheter demonstrated complete revascularization of the superior division and also the inferior division to the distal M2 region. A TICI 2B plus revascularization was achieved. Distal branch of the inferior division extending into the posterior frontal anterior parietal region remained occluded. Given the distal nature of the occlusion, aspiration was not attempted. A final control arteriogram performed through the 088 Zoom catheter in the distal right common carotid artery revealed patency of the intracranial and extracranial portion of the right ICA. The right anterior cerebral artery remained widely patent.  The right middle cerebral artery continued to demonstrate a TICI 2B plus revascularization. An 8 French Angio-Seal closure device was deployed at the right groin puncture site. Distal pulses remained present unchanged from prior to the procedure. A flat panel CT of the brain revealed no gross intracranial hemorrhage. The patient was left intubated due to his medical condition and transferred to the neuro ICU for post revascularization care. IMPRESSION: Status post endovascular complete revascularization of the right middle cerebral M1 segment, of the  superior division, and partially the inferior division achieving a TICI 2B plus revascularization. PLAN: As per Stroke Neurology. Electronically Signed   By: Thyra Nash M.D.   On: 10/12/2023 08:20   IR CT Head Ltd Result Date: 10/12/2023 INDICATION: New onset right gaze deviation, left-sided hemiplegia. Occluded right middle cerebral artery M1 segment on CT angiogram of the head and neck. EXAM: 1. EMERGENT LARGE VESSEL OCCLUSION THROMBOLYSIS (anterior CIRCULATION) COMPARISON:  CT angiogram of the head and neck of October 07, 2023. MEDICATIONS: No antibiotic was administered within 1 hour of the procedure. ANESTHESIA/SEDATION: General anesthesia. CONTRAST:  Omnipaque  300 approximately 115 cc. FLUOROSCOPY TIME:  Fluoroscopy Time: 41 minutes 24 seconds (29 mGy). COMPLICATIONS: None immediate. TECHNIQUE: Following a full explanation of the procedure along with the potential associated complications, an informed witnessed consent was obtained. The risks of intracranial hemorrhage of 10%, worsening neurological deficit, ventilator dependency, death and inability to revascularize were all reviewed in detail with the patient's daughter. The patient was then put under general anesthesia by the Department of Anesthesiology at [facility]. The right groin was prepped and draped in the usual sterile fashion. Thereafter using modified Seldinger technique, transfemoral access into the right common femoral artery was obtained without difficulty. Over an 0.035 inch guidewire an 8 French 25 cm [place] Pinnacle sheath was inserted. Through this, and also over an 0.035 inch guidewire a combination of a 125 cm 6 French Simmons 2 support catheter inside of a 100 cm 088 Zoom catheter was advanced to the aortic arch region, and selectively positioned in the right common carotid artery and then the distal right internal carotid artery in the proximal cavernous segment. FINDINGS: Right common carotid arteriogram demonstrates  the right external carotid artery and its major branches to be widely patent. The right internal carotid artery at the bulb to the cranial skull base demonstrates patency with moderate to moderately severe tortuosity in the proximal 1/3. The petrous, the cavernous and the supraclinoid right ICA demonstrate wide patency with a double U shaped tortuosity of the petrous and cavernous segments extending into the distal cavernous segment. Complete occlusion was evident of the right middle cerebral artery in the proximal M1 segment. Right anterior cerebral artery opacifies into the capillary and venous phases. PROCEDURE: Through the 088 support catheter in the petrous cavernous junction, the combination of an 071 135 cm Zoom aspiration catheter with an 043 Penumbra distal aspiration catheter was advanced over an 018 inch Aristotle micro guidewire with a moderate J configuration. The combination was navigated with modest difficulty due to the inherent tortuosity to the caval cavernous segment. The micro guidewire was then advanced into the right M1 segment followed by the 043 aspiration catheter followed by the 071 aspiration catheter which could only be advanced to just the origin of the right middle cerebral artery due to the severe tortuosity. The micro guidewire was then advanced into the M2 segment of the superior division followed by the 043 aspiration catheter. The micro guidewire was removed followed  by aspiration at the hub of the 043 distal aspiration catheter and also the 071 Zoom aspiration catheter. After a minute, the inner 043 aspiration catheter was removed followed by the 071 aspiration catheter 2 minutes later. A control arteriogram performed through the Zoom support catheter demonstrated revascularization of the right M1 segment and the superior division into the distal M2 segments. The inferior division remained occluded. A second pass was then made using the combination of an 021 162 cm microcatheter  advanced inside of an 062 Penumbra aspiration catheter. Over an 018 inch Aristotle micro guidewire, the combination was advanced without difficulty to the M1 segment. The micro guidewire was maneuvered into the inferior division distal M2 region followed by the microcatheter. The guidewire was removed. Good aspiration obtained from the hub of the microcatheter which was then connected to continuous heparinized saline infusion. A 4 mm x 40 mm Solitaire X retrieval device was then deployed in the usual manner. The 062 aspiration catheter was advanced into the proximal inferior division. Aspiration was applied at the hub of the 062 aspiration catheter for approximately 3 minutes. Thereafter the combination was retrieved and removed. A control angiogram performed through the 088 Zoom aspiration catheter demonstrated complete revascularization of the superior division and also the inferior division to the distal M2 region. A TICI 2B plus revascularization was achieved. Distal branch of the inferior division extending into the posterior frontal anterior parietal region remained occluded. Given the distal nature of the occlusion, aspiration was not attempted. A final control arteriogram performed through the 088 Zoom catheter in the distal right common carotid artery revealed patency of the intracranial and extracranial portion of the right ICA. The right anterior cerebral artery remained widely patent. The right middle cerebral artery continued to demonstrate a TICI 2B plus revascularization. An 8 French Angio-Seal closure device was deployed at the right groin puncture site. Distal pulses remained present unchanged from prior to the procedure. A flat panel CT of the brain revealed no gross intracranial hemorrhage. The patient was left intubated due to his medical condition and transferred to the neuro ICU for post revascularization care. IMPRESSION: Status post endovascular complete revascularization of the right middle  cerebral M1 segment, of the superior division, and partially the inferior division achieving a TICI 2B plus revascularization. PLAN: As per Stroke Neurology. Electronically Signed   By: Thyra Nash M.D.   On: 10/12/2023 08:20   DG Chest Port 1 View Result Date: 10/12/2023 CLINICAL DATA:  Acute respiratory failure with hypoxemia. EXAM: PORTABLE CHEST 1 VIEW COMPARISON:  10/09/2023 FINDINGS: Patient is slightly rotated to the right. Interval removal endotracheal tube. Two enteric tubes course into the region of the stomach and off the film as tips are not definitely visualized. Lungs are hypoinflated with continued mild hazy opacification over the left base with slight interval improved aeration. Mild prominence of the central pulmonary vessels suggesting a degree of vascular congestion without significant change. Stable cardiomegaly. Remainder of the exam is unchanged. IMPRESSION: 1. Interval removal of endotracheal tube. 2. Hypoinflation with continued mild hazy opacification over the left base with slight interval improved aeration. 3. Stable cardiomegaly with mild vascular congestion. Electronically Signed   By: Toribio Agreste M.D.   On: 10/12/2023 08:16   DG Abd Portable 1V Result Date: 10/12/2023 CLINICAL DATA:  Check gastric catheter placement EXAM: PORTABLE ABDOMEN - 1 VIEW COMPARISON:  10/11/2023 FINDINGS: Previously seen feeding catheter is again noted. Gastric catheter is now seen within the distal stomach as well. Scattered large  and small bowel gas remains consistent with ileus. IMPRESSION: Gastric catheter within the distal stomach. Electronically Signed   By: Oneil Devonshire M.D.   On: 10/12/2023 02:41   DG Abd 1 View Result Date: 10/11/2023 CLINICAL DATA:  Check feeding catheter placement, follow-up ileus EXAM: ABDOMEN - 1 VIEW COMPARISON:  None Available. FINDINGS: Feeding catheter is noted in the distal stomach directed towards the duodenum. Diffuse gaseous distension of the large and  small bowel is noted likely representing a generalized ileus. No free air is seen. No bony abnormality is noted. IMPRESSION: Changes consistent with generalized ileus. Feeding catheter is noted in the distal stomach. Electronically Signed   By: Oneil Devonshire M.D.   On: 10/11/2023 23:35   IR PATIENT EVAL TECH 0-60 MINS Result Date: 10/10/2023 Janice Lynwood BROCKS     10/10/2023  1:38 PM Pt had pressure held by Laymon Ellison for 10 min at the groin site and then had a new dressing applied to the groin site at this time on 7/25.   DG Chest Port 1 View Result Date: 10/09/2023 CLINICAL DATA:  Acute respiratory failure EXAM: PORTABLE CHEST 1 VIEW COMPARISON:  10/08/2023 FINDINGS: Endotracheal tube and NG tube remain in place, unchanged. Cardiomegaly, vascular congestion. Increasing right infrahilar airspace opacity. Stable left retrocardiac opacity. No effusions or acute bony abnormality. IMPRESSION: Bilateral lower lobe airspace opacities, increasing on the right since prior study could reflect atelectasis or infiltrates/pneumonia. Cardiomegaly, vascular congestion stable. Electronically Signed   By: Franky Crease M.D.   On: 10/09/2023 15:34   CT HEAD WO CONTRAST ( ) Result Date: 10/09/2023 EXAM: CT HEAD WITHOUT CONTRAST 10/09/2023 08:06:27 AM TECHNIQUE: CT of the head was performed without the administration of intravenous contrast. Automated exposure control, iterative reconstruction, and/or weight based adjustment of the mA/kV was utilized to reduce the radiation dose to as low as reasonably achievable. COMPARISON: None available. CLINICAL HISTORY: Stroke, follow up. Non con. Principal Problem: Acute ischemic stroke (HCC). Chief complaints: Code Stroke. FINDINGS: BRAIN AND VENTRICLES: Expected evolution of the large right MCA territory infarct is noted. Effacement of the sulci is present. No significant midline shift is present. Hyperdense thrombus is present within sylvian branches of the right MCA. No acute  left-sided abnormality is present. ORBITS: No acute abnormality. SINUSES: No acute abnormality. SOFT TISSUES AND SKULL: No acute soft tissue abnormality. No skull fracture. IMPRESSION: 1. Expected evolution of the large right MCA territory infarct with effacement of the sulci and hyperdense thrombus within sylvian branches of the right MCA. No significant midline shift. 2. No acute left-sided abnormality. Electronically signed by: Lonni Necessary MD 10/09/2023 08:29 AM EDT RP Workstation: [id number]   MR BRAIN WO CONTRAST Result Date: 10/08/2023 EXAM: MRI BRAIN WITHOUT CONTRAST 10/08/2023 12:38:36 PM TECHNIQUE: Multiplanar multisequence MRI of the head/brain was performed without the administration of intravenous contrast. COMPARISON: CT head without contrast 10/07/2023. CLINICAL HISTORY: Stroke, follow up. FINDINGS: BRAIN AND VENTRICLES: A large right MCA (middle cerebral artery) territory infarct is present. The right caudate head and lentiform nucleus are infarcted. Insular ribbon and ganglionic and supraganglionic cortical infarcts are noted. A focal area of cortical infarction is present in the medial anterior right frontal lobe. No focal hemorrhage is present. Diffuse T2 and FLAIR hyperintensities are present throughout the infarcted area. Additional periventricular T2 hyperintensities are moderately advanced for age. Diffuse cortical thickening is present with some mass effect but no significant midline shift. ORBITS: No acute abnormality. SINUSES AND MASTOIDS: Fluid levels are present in the posterior ethmoid air  cells, the right maxillary sinus and bilateral sphenoid sinuses. Fluid is present in the nasopharynx. BONES AND SOFT TISSUES: Normal marrow signal. No acute soft tissue abnormality. IMPRESSION: 1. Large right MCA territory infarct with associated diffuse cortical thickening and some mass effect, but no significant midline shift. No focal hemorrhage. 2. Moderately advanced periventricular T2  hyperintensities for age. This likely reflects sequela of chronic microvascular ischemia. 3. Diffuse sinus disease likely related to intubated status. Electronically signed by: Lonni Necessary MD 10/08/2023 12:59 PM EDT RP Workstation: [id number]   MR ANGIO HEAD WO CONTRAST Result Date: 10/08/2023 EXAM: MR Angiography Head without intravenous Contrast. 10/08/2023 12:38:20 PM TECHNIQUE: Magnetic resonance angiography images of the head without intravenous contrast. Multiplanar 2D and 3D reformatted images are provided for review. COMPARISON: Cerebral arteriogram 10/07/2023. CLINICAL HISTORY: Stroke, follow up. Interval revascularization of the right middle cerebral artery. FINDINGS: ANTERIOR CIRCULATION: No significant stenosis of the internal carotid arteries. No significant stenosis of the anterior cerebral arteries. A distal right M2 occlusion is somewhat more proximal than on the final angio images. No new occlusion is present. No aneurysm. POSTERIOR CIRCULATION: No significant stenosis of the posterior cerebral arteries. No significant stenosis of the basilar artery. No significant stenosis of the vertebral arteries. No aneurysm. IMPRESSION: 1. Distal right M2 occlusion, somewhat more proximal than on the final angio images. No new occlusion is present. Electronically signed by: Lonni Necessary MD 10/08/2023 12:55 PM EDT RP Workstation: [id number]   DG Chest Port 1 View Result Date: 10/08/2023 CLINICAL DATA:  Acute respiratory failure. EXAM: PORTABLE CHEST 1 VIEW COMPARISON:  Chest radiograph dated 10/07/2023. FINDINGS: Endotracheal tube approximately 4.5 cm above the carina. Enteric tube extends below diaphragm with tip beyond the inferior margin of the image. There is shallow inspiration. Cardiomegaly with vascular congestion. Left lung base atelectasis or infiltrate. No pneumothorax. No acute osseous pathology. IMPRESSION: 1. Endotracheal tube above the carina. 2. Cardiomegaly with vascular  congestion. 3. Left lung base atelectasis or infiltrate. Electronically Signed   By: Vanetta Chou M.D.   On: 10/08/2023 11:19   ECHOCARDIOGRAM COMPLETE Result Date: 10/07/2023    ECHOCARDIOGRAM REPORT   Patient Name:   FILLMORE BYNUM Date of Exam: 10/07/2023 Medical Rec #:  [id number]      Height:       72.0 in Accession #:    [id number]     Weight:       317.5 lb Date of Birth:  05-05-1975     BSA:          2.593 m Patient Age:    47 years       BP:           126/81 mmHg Patient Gender: M              HR:           64 bpm. Exam Location:  Inpatient Procedure: 2D Echo, Cardiac Doppler and Color Doppler (Both Spectral and Color            Flow Doppler were utilized during procedure). Indications:    Stroke i63.9  History:        Patient has prior history of Echocardiogram examinations, most                 recent 04/12/2019. CHF, ESRD; Risk Factors:Hypertension.  Sonographer:    Damien Senior RDCS Referring Phys: [id number] CORTNEY E DE LA TORRE  Sonographer Comments: Bubble study attempted 2x IMPRESSIONS  1. Left ventricular ejection fraction, by estimation,  is 55 to 60%. The left ventricle has normal function. The left ventricle has no regional wall motion abnormalities. There is severe concentric left ventricular hypertrophy. Left ventricular diastolic  parameters are consistent with Grade II diastolic dysfunction (pseudonormalization).  2. Right ventricular systolic function is normal. The right ventricular size is severely enlarged. Tricuspid regurgitation signal is inadequate for assessing PA pressure.  3. Left atrial size was mildly dilated.  4. Right atrial size was severely dilated.  5. The mitral valve is normal in structure. Trivial mitral valve regurgitation.  6. The aortic valve is tricuspid. Aortic valve regurgitation is not visualized.  7. There is mild dilatation of the ascending aorta, measuring 41 mm.  8. Bubble study is nondiagnostic. FINDINGS  Left Ventricle: Left ventricular ejection fraction,  by estimation, is 55 to 60%. The left ventricle has normal function. The left ventricle has no regional wall motion abnormalities. The left ventricular internal cavity size was normal in size. There is  severe concentric left ventricular hypertrophy. Left ventricular diastolic parameters are consistent with Grade II diastolic dysfunction (pseudonormalization). Right Ventricle: The right ventricular size is severely enlarged. No increase in right ventricular wall thickness. Right ventricular systolic function is normal. Tricuspid regurgitation signal is inadequate for assessing PA pressure. Left Atrium: Left atrial size was mildly dilated. Right Atrium: Right atrial size was severely dilated. Pericardium: There is no evidence of pericardial effusion. Mitral Valve: The mitral valve is normal in structure. Trivial mitral valve regurgitation. Tricuspid Valve: The tricuspid valve is normal in structure. Tricuspid valve regurgitation is trivial. Aortic Valve: The aortic valve is tricuspid. Aortic valve regurgitation is not visualized. Pulmonic Valve: The pulmonic valve was normal in structure. Pulmonic valve regurgitation is trivial. Aorta: The aortic root is normal in size and structure. There is mild dilatation of the ascending aorta, measuring 41 mm. Venous: IVC assessment for right atrial pressure unable to be performed due to mechanical ventilation. IAS/Shunts: The interatrial septum was not well visualized. Agitated saline contrast was given intravenously to evaluate for intracardiac shunting. Bubble study is nondiagnostic.  LEFT VENTRICLE PLAX 2D LVIDd:         4.30 cm   Diastology LVIDs:         3.10 cm   LV e' medial:    5.00 cm/s LV PW:         1.80 cm   LV E/e' medial:  12.8 LV IVS:        2.20 cm   LV e' lateral:   5.55 cm/s LVOT diam:     2.00 cm   LV E/e' lateral: 11.5 LV SV:         63 LV SV Index:   24 LVOT Area:     3.14 cm  RIGHT VENTRICLE RV S prime:     13.60 cm/s TAPSE (M-mode): 2.2 cm LEFT ATRIUM              Index        RIGHT ATRIUM           Index LA diam:        5.30 cm 2.04 cm/m   RA Area:     34.60 cm LA Vol (A2C):   97.5 ml 37.60 ml/m  RA Volume:   125.00 ml 48.21 ml/m LA Vol (A4C):   88.6 ml 34.17 ml/m LA Biplane Vol: 99.7 ml 38.45 ml/m  AORTIC VALVE LVOT Vmax:   91.40 cm/s LVOT Vmean:  68.000 cm/s LVOT VTI:    0.200 m  AORTA Ao Root diam: 3.30 cm Ao Asc diam:  4.10 cm MITRAL VALVE MV Area (PHT): 3.12 cm    SHUNTS MV Decel Time: 243 msec    Systemic VTI:  0.20 m MV E velocity: 63.90 cm/s  Systemic Diam: 2.00 cm MV A velocity: 59.30 cm/s MV E/A ratio:  1.08 Morene Brownie Electronically signed by Morene Brownie Signature Date/Time: 10/07/2023/3:29:45 PM    Final    DG CHEST PORT 1 VIEW Result Date: 10/07/2023 CLINICAL DATA:  Encounter for intubation EXAM: PORTABLE CHEST 1 VIEW COMPARISON:  October 07, 2023 at 9:33:54 a.m. FINDINGS: Endotracheal tube terminates 1.7 cm proximal to carina. Enteric tube 2 is in place extending below left hemidiaphragm tip outside of the field of view. Cardiomegaly, stable to prior accentuated by low lung volume. There are patchy opacities involving bilateral perihilar lungs increase in right lung apex. Bilateral vascular congestion. Blunting of left costophrenic angle. No pleural effusion on the right. No acute osseous abnormality. IMPRESSION: Endotracheal tube tip 1.7 cm proximal to carina. Increased patchy opacities of bilateral perihilar and right hemithorax may represent pulmonary edema or airspace consolidations. Electronically Signed   By: Megan  Zare M.D.   On: 10/07/2023 14:07   DG Chest Portable 1 View Result Date: 10/07/2023 CLINICAL DATA:  ETT Placement EXAM: PORTABLE CHEST 1 VIEW COMPARISON:  Same day chest radiograph dated 10/07/2023 at 9:25 a.m. FINDINGS: Interval retraction of the endotracheal tube which now terminates approximately 4.3 cm above the carina. Enteric tube courses below the left hemidiaphragm, beyond the field of view. Mild left basilar  atelectasis. No other significant change compared to the prior exam. Persistent low lung volumes with accentuation of the cardiac silhouette and bronchovascular crowding. IMPRESSION: 1. Interval retraction of the endotracheal tube which now terminates approximately 4.3 cm above the carina. 2. Mild left basilar atelectasis. Electronically Signed   By: Harrietta Sherry M.D.   On: 10/07/2023 09:49   DG Chest Portable 1 View Result Date: 10/07/2023 CLINICAL DATA:  post intubation EXAM: PORTABLE CHEST 1 VIEW COMPARISON:  09/14/2023. FINDINGS: Endotracheal tube tip terminates in the proximal right mainstem bronchus, retraction by at least 4 cm is recommended. Enteric tube tip and side port in the stomach. Low lung volumes with accentuation of the cardiac silhouette and bronchovascular crowding. No sizable pleural effusion or pneumothorax. No acute osseous abnormality identified. IMPRESSION: 1. Endotracheal tube tip terminates in the proximal right mainstem bronchus, retraction by at least 4 cm is recommended. 2.  Enteric tube tip and side port in the stomach. Electronically Signed   By: Harrietta Sherry M.D.   On: 10/07/2023 09:47   CT HEAD CODE STROKE WO CONTRAST Result Date: 10/07/2023 EXAM: CT HEAD WITHOUT 10/07/2023 08:41:14 AM TECHNIQUE: CT of the head was performed without the administration of intravenous contrast. Automated exposure control, iterative reconstruction, and/or weight based adjustment of the mA/kV was utilized to reduce the radiation dose to as low as reasonably achievable. COMPARISON: None available. CLINICAL HISTORY: Neuro deficit, acute, stroke suspected. Lkw 5am, l/s paralysis, slurred speech; [place] [phone] FINDINGS: BRAIN AND VENTRICLES: Decreased gray-white differentiation is noted along the right insular cortex and right lentiform nucleus. Decreased density is present within the right caudate head. No acute hemorrhage or mass lesion is present. ORBITS: No acute abnormality. SINUSES AND  MASTOIDS: No acute abnormality. SOFT TISSUES AND SKULL: No acute skull fracture. No acute soft tissue abnormality. VASCULATURE: Calcific density is noted in the proximal right M1 segment. Atherosclerotic calcifications are present within the cavernous internal carotid  arteries. [place] stroke program early CT (ASPECT) score ----- Ganglionic (caudate, IC, lentiform nucleus, insula, M1-M3): 4 Supraganglionic (M4-M6): 3 Total: 7 IMPRESSION: 1. Findings consistent with acute right MCA territory ischemic changes, ASPECT score 7. 2. And calcific density is noted in the proximal right M1 segment. This is most concerning for a calcific embolus. Intracranial atherosclerotic changes considered less likely. 3. No acute hemorrhage. Critical findings were communicated to Dr. Merrianne via tracey at 8:50 am Electronically signed by: Lonni Necessary MD 10/07/2023 08:51 AM EDT RP Workstation: [id number]    Labs:  Basic Metabolic Panel: Recent Labs  Lab 10/29/23 0830 10/29/23 1959 11/01/23 1733  NA 134* 135 133*  K 5.0 3.9 5.4*  CL 91* 92* 88*  CO2 24 28 25   GLUCOSE 113* 92 91  BUN 77* 33* 88*  CREATININE 16.24* 8.47* 18.41*  CALCIUM  9.9 9.5 9.2  PHOS 8.2* 4.1 7.0*    CBC: Recent Labs  Lab 10/29/23 1430 11/01/23 1733  WBC 8.9 6.7  HGB 11.2* 10.4*  HCT 34.4* 32.2*  MCV 101.2* 100.0  PLT 307 239    CBG: No results for input(s): GLUCAP in the last 168 hours.   Brief HPI:   Marquiz Sotelo is a 48 y.o. male with a significant past medical history, including systolic and diastolic congestive heart failure, anemia, HTN, obesity, and end-stage renal disease on hemodialysis TTHS. Presented to [facility] on 10/07/2023 after experiencing an acute onset of left-sided weakness, left side neglect, dysarthria and right gaze preference.  The symptoms began earlier in the morning, and the patient presented with emergency medical services as a code stroke. Notably, the patient had been compliant with  his hemodialysis treatment prior to this period. In the emergency department vitals remained stable. He was intubated for airway protection and because of agitation and restlessness. A chest X-ray showed no significant disease. Intravenous Cleviprex  was initiated. Initial CT head revealed right MCA territory ischemic changes with calcific density in proximal right M1 concerning for calcific embolus.  The patient was administered TNK, and IR was consulted and Dr. Dolphus performed a cerebral angiogram which revealed a right M1 occlusion. Subsequently, the patient underwent a thrombectomy of the right M1, achieving TICI 2B revascularization of the occluded right middle cerebral artery M1 segment, including the superior and inferior divisions to the right M2 region. An MRI demonstrated a large right MCA territory CVA with associated diffuse cortical thickening and some mass effect but no significant midline shift. MRA revealed distal right M2 occlusion but no other new occlusions. He was  extubated on 10/11/2023. On 7/30, the patient vomited and aspirated, leading to the placement of a nasogastric tube. An abdominal X-ray indicated ileus.  Echocardiogram showed an ejection fraction of 50-55%. Prior to admission, the patient was not on any antithrombotics but was now placed on Aspirin  81mg  and Plavix  75mg , with DAPT recommended for three months followed by Aspirin  alone. Speech therapy was consulted, and a modified barium swallow study was conducted. The patient was advised to follow a dysphagia level 1 pureed diet with nectar-thick liquids. Patient was independent and working prior to admission. He lives in a one level apartment. Currently requiring max-mod assistance for ADL's and mod assist +2 for transfers and mobility. Therapy evaluations completed due to patient decreased functional mobility was admitted for a comprehensive rehab program.    Hospital Course: Tamer Baughman was admitted to rehab 10/18/2023 for  inpatient therapies to consist of PT, ST and OT at least three hours five days  a week. Past admission physiatrist, therapy team and rehab RN have worked together to provide customized collaborative inpatient rehab. He was admitted for inpatient rehabilitation due to functional deficits following an acute right middle cerebral artery (MCA) cerebrovascular accident (CVA) with right M1 occlusion. The patient underwent thrombolytic therapy and intra-arterial rescue (IR) intervention. The patient has been on aspirin  and Plavix  for antiplatelet therapy, with plans to continue Plavix  for three months before switching to aspirin  alone. Heparin  was initially prescribed but discontinued as the patient achieved ambulation over 150 feet with physical therapy. Pain management included Tylenol  and Flexeril  for back pain. The patient experienced pruritus without rash, likely due to renal disease, and was treated with Atarax  and Sarna lotion, although symptoms persisted. Nutritional support was modified from tube feeds and protein supplements to a dysphagia 3 diet with thin liquids, and the patient responded well. The patient has a history of end-stage renal disease on hemodialysis, with nephrology monitoring elevated potassium levels and administering Lokelma . He will follow up with nephrology for hemodialysis and ongoing care. History of hypertension, blood pressures were monitored on TID basis and medications were adjusted for orthostatic hypotension, including the initiation of Midodrine . He will need to follow up outpatient with PCP.  Anemia was stable with a hemoglobin level of 10.4. Constipation was managed successfully through a bowel program. The patient showed improvement in procalcitonin levels and white blood cell counts and remained afebrile. For acute hypoxic and hypercapnic respiratory failure, the patient was on 2 liters of nasal cannula oxygen, and a chest X-ray revealed possible obstructive sleep apnea, warranting  an outpatient sleep study referral. Obesity management included dietary education and weight loss advice. The patient continued home statin therapy for hyperlipidemia. Diabetes has been monitored with ac/hs CBG checks and SSI was use prn for tighter BS control. Diabetic teaching was completed.   Rehab course: During patient's stay in rehab weekly team conferences were held to monitor patient's progress, set goals and discuss barriers to discharge. At admission, patient required max-mod assistance for ADL's and mod assist +2 for transfers and mobility and SLP for dysphagia.  Speech-language pathology sessions contributed to the patient's successful dietary upgrade, transitioning from tube feeds to a dysphagia 3 diet with thin liquids, indicating improved swallowing function.  He has had improvement in activity tolerance, balance, postural control as well as ability to compensate for deficits. He has had improvement in functional use RUE/LUE  and RLE/LLE as well as improvement in awareness. He participated in floor transfer with overall CGA for safety and therapeutic exercises.  He did not meet supervision goals for toileting and dressing and continues to need min assist due to left inattention and hemiparesis.  He  will discharge at overall supervision to min assist level. Family teaching completed upon discharge to home with the patient's mother at bedside.     Disposition:  There are no questions and answers to display.         Diet: Renal  Special Instructions:  -Referral has been placed for outpatient sleep study  - Changes have been made to your blood pressure medications please speak to your provider before resuming.   -No driving smoking or alcohol or illicit drug use    Discharge Instructions     Ambulatory referral to Sleep Studies   Complete by: As directed    Evaluate and treat for sleep study.      Allergies as of 11/03/2023   No Known Allergies      Medication List  PAUSE taking these medications    amLODipine  10 MG tablet Wait to take this until your doctor or other care provider tells you to start again. Commonly known as: NORVASC  Place 1 tablet (10 mg total) into feeding tube daily.   insulin  aspart 100 UNIT/ML injection Wait to take this until your doctor or other care provider tells you to start again. Commonly known as: novoLOG  Inject 0-6 Units into the skin every 4 (four) hours.   isosorbide  dinitrate 10 MG tablet Wait to take this until your doctor or other care provider tells you to start again. Commonly known as: ISORDIL  Place 0.5 tablets (5 mg total) into feeding tube 2 (two) times daily.       STOP taking these medications    docusate 50 MG/5ML liquid Commonly known as: COLACE   ondansetron  4 MG/2ML Soln injection Commonly known as: ZOFRAN  Replaced by: ondansetron  4 MG tablet       TAKE these medications    aspirin  81 MG chewable tablet Chew 1 tablet (81 mg total) by mouth daily. Start taking on: November 04, 2023 What changed: how to take this   atorvastatin  40 MG tablet Commonly known as: LIPITOR Take 1 tablet (40 mg total) by mouth daily. Start taking on: November 04, 2023 What changed: how to take this   bisacodyl  5 MG EC tablet Commonly known as: DULCOLAX Take 1 tablet (5 mg total) by mouth daily as needed for moderate constipation.   camphor-menthol  lotion Commonly known as: SARNA Apply topically as needed for itching.   carvedilol  6.25 MG tablet Commonly known as: COREG  Take 1 tablet (6.25 mg total) by mouth 2 (two) times daily. What changed:  medication strength how much to take how to take this   clopidogrel  75 MG tablet Commonly known as: PLAVIX  Take 1 tablet (75 mg total) by mouth daily. Start taking on: November 04, 2023 What changed: how to take this   cyclobenzaprine  5 MG tablet Commonly known as: FLEXERIL  Take 1 tablet (5 mg total) by mouth 3 (three) times daily as needed for muscle  spasms. What changed: how to take this   hydrOXYzine  10 MG tablet Commonly known as: ATARAX  Take 1 tablet (10 mg total) by mouth 3 (three) times daily as needed for itching.   lanthanum  750 MG chewable tablet Commonly known as: FOSRENOL  Chew 2 tablets (1,500 mg total) by mouth 3 (three) times daily with meals. What changed:  medication strength how much to take how to take this   midodrine  5 MG tablet Commonly known as: PROAMATINE  Take 1 tablet (5 mg total) by mouth 2 (two) times daily with breakfast and lunch. Start taking on: November 04, 2023   ondansetron  4 MG tablet Commonly known as: ZOFRAN  Take 1 tablet (4 mg total) by mouth every 6 (six) hours as needed for nausea or vomiting. Replaces: ondansetron  4 MG/2ML Soln injection   pantoprazole  40 MG tablet Commonly known as: PROTONIX  Take 1 tablet (40 mg total) by mouth at bedtime.   senna-docusate 8.6-50 MG tablet Commonly known as: Senokot-S Take 2 tablets by mouth at bedtime.   witch hazel-glycerin  pad Commonly known as: TUCKS Apply topically as needed for hemorrhoids.        Follow-up Information     [facility] Follow up.   Specialty: Neurology Why: Call for an appointment. Contact information: [address] [place]  [address] [phone]        Carilyn Prentice BRAVO, MD Follow up.  Specialty: Physical Medicine and Rehabilitation Why: Office will call for appointment Contact information: [address] [place] [address] [phone]         Dennise Hoes, MD Follow up.   Specialty: Nephrology Why: Call for an appointment. Contact information: [address] [place] [address] [phone]         Benjamine South, MD Follow up.   Why: Primary Care Provider-Call for an appointment within 1 week following dishcarge for follow up. Contact information: [address] [place], [place] [address]  [phone]                 Signed: Daphne LITTIE Finders 11/03/2023, 3:47 PM

## 2023-10-24 NOTE — Progress Notes (Signed)
 Patient ID: Nathan Campbell, male   DOB: 1976-01-03, 48 y.o.   MRN: [id number] Met with the patient and mother to review current medical situation, rehab process, team conference and plan of care. Reviewed current dietary modification and medications for secondary risk management. Mother reports she lives out of town and patient will need transportation set up. Patient noted he has spoken with Destiny at [facility] and just needs to call to set up transportation to HD. Discussed 1200 cc fluid restriction. Continue to follow along to address educational needs to facilitate preparation for discharge. Fredericka Barnie NOVAK

## 2023-10-25 LAB — GLUCOSE, CAPILLARY: Glucose-Capillary: 143 mg/dL — ABNORMAL HIGH (ref 70–99)

## 2023-10-25 LAB — HEPATITIS B SURFACE ANTIBODY, QUANTITATIVE: Hep B S AB Quant (Post): 62 m[IU]/mL

## 2023-10-25 MED ORDER — HEPARIN SODIUM (PORCINE) 1000 UNIT/ML IJ SOLN
INTRAMUSCULAR | Status: AC
  Filled 2023-10-25: qty 6

## 2023-10-25 MED ORDER — HEPARIN SODIUM (PORCINE) 1000 UNIT/ML IJ SOLN
4000.0000 [IU] | Freq: Once | INTRAMUSCULAR | Status: AC
  Administered 2023-10-25 (×2): 4000 [IU]

## 2023-10-25 MED ORDER — LIDOCAINE HCL (PF) 1 % IJ SOLN: 5.0000 mL | INTRAMUSCULAR | Status: DC | PRN

## 2023-10-25 MED ORDER — HEPARIN SODIUM (PORCINE) 1000 UNIT/ML DIALYSIS: 1000.0000 [IU] | INTRAMUSCULAR | Status: DC | PRN

## 2023-10-25 MED ORDER — LIDOCAINE-PRILOCAINE 2.5-2.5 % EX CREA
1.0000 | TOPICAL_CREAM | CUTANEOUS | Status: DC | PRN
Start: 2023-10-25 — End: 2023-10-25

## 2023-10-25 MED ORDER — HEPARIN SODIUM (PORCINE) 1000 UNIT/ML IJ SOLN
2000.0000 [IU] | Freq: Once | INTRAMUSCULAR | Status: AC
  Administered 2023-10-25 (×2): 2000 [IU]

## 2023-10-25 MED ORDER — SODIUM ZIRCONIUM CYCLOSILICATE 10 G PO PACK
10.0000 g | PACK | Freq: Two times a day (BID) | ORAL | Status: AC
  Administered 2023-10-25 – 2023-10-27 (×8): 10 g via ORAL
  Filled 2023-10-25 (×6): qty 1

## 2023-10-25 MED ORDER — ALTEPLASE 2 MG IJ SOLR: 2.0000 mg | Freq: Once | INTRAMUSCULAR | Status: DC | PRN

## 2023-10-25 MED ORDER — PENTAFLUOROPROP-TETRAFLUOROETH EX AERO
1.0000 | INHALATION_SPRAY | CUTANEOUS | Status: DC | PRN
Start: 2023-10-25 — End: 2023-10-25

## 2023-10-25 NOTE — Progress Notes (Signed)
 Patient ID: Nathan Campbell, male   DOB: 12/22/1975, 48 y.o.   MRN: [id number]  SW confirmed with pt mother she would like [facility] [place] application completed for him. She reports in the beginning she will take him but would like for him to have his independence as well.   [name] [name], MSW, LCSW Office: [phone] Cell: [phone] Fax: [phone]

## 2023-10-25 NOTE — Progress Notes (Signed)
 Occupational Therapy Session Note  Patient Details  Name: Nathan Campbell MRN: [id number] Date of Birth: [birth year]  Today's Date: 10/25/2023 OT Individual Time: 1000-1015 OT Individual Time Calculation (min): 15 min  and Today's Date: 10/25/2023 OT Missed Time: 45 Minutes Missed Time Reason: Patient fatigue;Nursing care   Short Term Goals: Week 1:  OT Short Term Goal 1 (Week 1): Pt will be able to don shirt with min A. OT Short Term Goal 2 (Week 1): Pt will be able to don pants over feet with min A. OT Short Term Goal 3 (Week 1): pt will be able to perform self ROM of LUE. OT Short Term Goal 4 (Week 1): Pt will be able to bathe with min A using long handled sponge.   Skilled Therapeutic Interventions/Progress Updates:    Pt bed level at time of session, nursing present and taking orthostatic vitals. Reported pt got nauseous in previous therapy session and returned to bed level, sleeping and difficult to arouse. BP in semireclined bed level 81/51, sitting EOB with Supervision for additional checks. BP continued to drop and returned to semireclined position, left with nursing. Missed 45 mins of OT 2/2 low BP and nursing care.   Therapy Documentation Precautions:  Precautions Precautions: Fall Precaution/Restrictions Comments: L hemipareisis UE>LE, L hemianopsia, cortrak, watch SpO2 and RR Restrictions Weight Bearing Restrictions Per Provider Order: No     Therapy/Group: Individual Therapy  Chiquita JAYSON Hopping 10/25/2023, 7:29 AM

## 2023-10-25 NOTE — Progress Notes (Signed)
 Physical Therapy Session Note  Patient Details  Name: Nathan Campbell MRN: [id number] Date of Birth: [birth year]  Today's Date: 10/25/2023 PT Individual Time: 0830-0957 PT Individual Time Calculation (min): 87 min   Short Term Goals: Week 1:  PT Short Term Goal 1 (Week 1): Pt will demosntrate bed mobility with CGA and good technique. PT Short Term Goal 2 (Week 1): pt will perform standing transfers with consistent SBA/ CGA. PT Short Term Goal 3 (Week 1): Pt will perform ambulation up to 100 ft using LRAD with CGA. PT Short Term Goal 4 (Week 1): Pt will demonstrate stair navigation up /down at least 8 steps with overall MinA. PT Short Term Goal 5 (Week 1): Pt will complete appropriate outcome measure.  Skilled Therapeutic Interventions/Progress Updates:  Pt in w/c when PT arrived, pt agreeable for session. Pt requiring max assist with donning shirt prior to therapy.  Pt wheeled to gym, requires x2 ppl for tx for balance, fall prevention. Stair negotiation 2x 4 steps up/down with x2 railing for support and x2 SBA/CGA.  NMR: ant/post wt shifting balance on Air ex EO with pt progressing from mod-min-CGA; EC requiring mod assist.  Standing basketball toss on non compliant/compliant surfaces, beach ball toss; with RUE targeting multiple planes, o/s BOS. Pt required mod assist to transfer on/off air ex, cueing to facilitate hip flexors which pt able to return demo. Pt demonstrating increased ankle/hip strategies toward end of session, improved dynamic balance on compliant surface.   Pt became nauseated, requiring extended break, started to vomit. PT provided cool washcloth, emesis bag. Pt appeared lethargic, MD came into gym to assess pt and states pt was feeling sick yesterday as well. Pt taken back to room and needed to have a BM, pt able to transfer self onto commode SBA, pt passed off to Nsg.      Therapy Documentation Precautions:  Precautions Precautions: Fall Precaution/Restrictions  Comments: L hemipareisis UE>LE, L hemianopsia, cortrak, watch SpO2 and RR Restrictions Weight Bearing Restrictions Per Provider Order: No G Pain: Pain Assessment Pain Scale: 0-10 Pain Score: 0-No pain    Therapy/Group: Individual Therapy  Arland GORMAN Fast 10/25/2023, 9:27 AM

## 2023-10-25 NOTE — Progress Notes (Signed)
 Speech Language Pathology Daily Session Note  Patient Details  Name: Nathan Campbell MRN: [id number] Date of Birth: 28-May-1975  Today's Date: 10/25/2023 SLP Group Time: 1200-1245 SLP Group Time Calculation (min): 45 min  Short Term Goals: Week 1: SLP Short Term Goal 1 (Week 1): Patient will consume D1/NTL diet with use of compensatory strategies given min multimodal A SLP Short Term Goal 2 (Week 1): Patient will utilize speech intelligibility strategies to reach 75% intelligibility at the word level given mod multimodal A SLP Short Term Goal 3 (Week 1): Patient will demonstrate functional problem solving during daily tasks given mod multimodal A SLP Short Term Goal 4 (Week 1): Patient will recall and utilize memory compensatory aids given mod multimodal A SLP Short Term Goal 5 (Week 1): Patient will demonstrate awareness of cognitive and physical changes since admission given mod multimodal A  Group Description: [employer]: Patient participated in [employer] with both OT and SLP with focus on self-feeding, dysphagia goals, cognitive goals, and appropriate social interaction within a group environment.    Individual level documentation:  Patient participated in diner's club with focus on dysphagia goals. Patient consumed their lunch meal of dysphasia 1 and nectar thick liquids. Patient consumed meal with inconsistent overt s/s of aspiration and required min assist for use of swallowing compensatory strategies. Recommend patient continue current diet.    Patient participated in diner's club with focus on communication goals. Patient required mod assist for use of speech intelligibility increasing strategies at the conversation level.   Therapy/Group: Dysphagia Group  [name], M.A., CCC-SLP  [name] 10/25/2023, 1:24 PM

## 2023-10-25 NOTE — Progress Notes (Signed)
 Patient ID: Nathan Campbell, male   DOB: January 09, 1976, 48 y.o.   MRN: [id number] S: Having more nausea this morning O:BP (!) 147/97 (BP Location: Right Arm)   Pulse 99   Temp 97.6 F (36.4 C)   Resp 18   Ht 6' (1.829 m)   Wt 133.4 kg   SpO2 90%   BMI 39.89 kg/m   Intake/Output Summary (Last 24 hours) at 10/25/2023 0955 Last data filed at 10/25/2023 9161 Gross per 24 hour  Intake 720 ml  Output --  Net 720 ml   Intake/Output: I/O last 3 completed shifts: In: 720 [P.O.:720] Out: 450 [Emesis/NG output:450]  Intake/Output this shift:  Total I/O In: 240 [P.O.:240] Out: -  Weight change:  Gen: NAD CVS: RRR Resp:CTA Abd: +BS< soft, NT/ND Ext: no edema, LUE AVF faint thrill/bruit  Recent Labs  Lab 10/18/23 1000 10/20/23 0616 10/20/23 1107 10/22/23 0500 10/24/23 0553  NA 135 132* 133* 133* 133*  K 5.8* 5.1 3.8 5.2* 5.9*  CL 89* 91* 91* 90* 88*  CO2 23 23 27 24 28   GLUCOSE 103* 129* 152* 106* 105*  BUN 139* 117* 50* 102* 88*  CREATININE 18.98* 16.05* 7.66* 15.03* 15.04*  ALBUMIN 3.3* 3.3* 3.7 3.5 3.6  CALCIUM  9.8 9.9 9.9 10.1 10.4*  PHOS >30.0* 11.4* 4.2 9.8* 10.0*   Liver Function Tests: Recent Labs  Lab 10/20/23 1107 10/22/23 0500 10/24/23 0553  ALBUMIN 3.7 3.5 3.6   No results for input(s): LIPASE, AMYLASE in the last 168 hours. No results for input(s): AMMONIA in the last 168 hours. CBC: Recent Labs  Lab 10/18/23 1000 10/20/23 0616 10/22/23 0500 10/24/23 0553  WBC 11.2* 9.8 9.1 10.9*  NEUTROABS  --  6.0 5.0 6.5  HGB 10.9* 10.6* 11.1* 11.3*  HCT 33.9* 33.3* 34.0* 34.7*  MCV 98.5 99.1 98.0 98.9  PLT 384 381 353 363   Cardiac Enzymes: No results for input(s): CKTOTAL, CKMB, CKMBINDEX, TROPONINI in the last 168 hours. CBG: Recent Labs  Lab 10/22/23 0032 10/22/23 0418 10/22/23 1229 10/22/23 1659 10/22/23 1957  GLUCAP 103* 90 107* 87 130*    Iron  Studies: No results for input(s): IRON , TIBC, TRANSFERRIN, FERRITIN in the  last 72 hours. Studies/Results: No results found.  amLODipine   5 mg Oral Daily   aspirin   81 mg Oral Daily   atorvastatin   40 mg Oral Daily   carvedilol   12.5 mg Oral BID   Chlorhexidine  Gluconate Cloth  6 each Topical Q0600   clopidogrel   75 mg Oral Daily   docusate  100 mg Oral BID   heparin  injection (subcutaneous)  5,000 Units Subcutaneous Q8H   isosorbide  dinitrate  5 mg Oral BID   lanthanum   1,000 mg Oral TID WC   mouth rinse  15 mL Mouth Rinse 4 times per day   pantoprazole   40 mg Oral QHS    BMET    Component Value Date/Time   NA 133 (L) 10/24/2023 0553   K 5.9 (H) 10/24/2023 0553   CL 88 (L) 10/24/2023 0553   CO2 28 10/24/2023 0553   GLUCOSE 105 (H) 10/24/2023 0553   BUN 88 (H) 10/24/2023 0553   CREATININE 15.04 (H) 10/24/2023 0553   CALCIUM  10.4 (H) 10/24/2023 0553   GFRNONAA 4 (L) 10/24/2023 0553   GFRAA 32 (L) 05/13/2017 1653   CBC    Component Value Date/Time   WBC 10.9 (H) 10/24/2023 0553   RBC 3.51 (L) 10/24/2023 0553   HGB 11.3 (L) 10/24/2023 0553   HGB  9.2 (L) 01/22/2021 1235   HCT 34.7 (L) 10/24/2023 0553   HCT 28.9 (L) 01/22/2021 1235   PLT 363 10/24/2023 0553   PLT 208 01/22/2021 1235   MCV 98.9 10/24/2023 0553   MCV 93 01/22/2021 1235   MCH 32.2 10/24/2023 0553   MCHC 32.6 10/24/2023 0553   RDW 13.2 10/24/2023 0553   RDW 12.0 01/22/2021 1235   LYMPHSABS 1.8 10/24/2023 0553   LYMPHSABS 0.8 01/22/2021 1235   MONOABS 1.6 (H) 10/24/2023 0553   EOSABS 0.9 (H) 10/24/2023 0553   EOSABS 0.1 01/22/2021 1235   BASOSABS 0.1 10/24/2023 0553   BASOSABS 0.0 01/22/2021 1235    Dialysis Orders: TTS - AF 4:15hr, 500/600, EDW 138kg, 2K/2C bath, AVF, heparin  4000+ - Mircera 50mcg given 10/06/23 - Hectoral 7mcg IV q HD - Takes Auryxia  3/meals and Xphozah 30mg  BID   Assessment/Plan: Acute R MCA CVA: S/p TNK 7/25. Required intubation, now on [place] O2. Ongoing L sided weakness. Per neuro. Working with PT currently. Now in CIR. AHRF, ?aspiration +  CVA: Now extubated, on [place] O2. S/p Unasyn  course. WBC normalized.  ESRD: Continue HD on TTS schedule. Next HD 8/12. Hyperkalemia: S/p Lokelma  X 3 doses. K+ now is 5.2. Checking labs in AM. Monitor trend, may need to consider Lokelma  on non-HD days if no improvement.  May need to change supplements. HTN/volume: BP decent, occ high - UF as tolerated, keep lowering EDW. Anemia of ESRD: Hgb 11.1, no ESA for now. Secondary HPTH: Corrected Ca 10.4 and Phos 11.4, VDRA on hold. Home auryxia  can not be given via NG tube - change to fosrenol  for now. Nutrition: Alb low, getting TF + protein supplements. Per RD - unable to do Nepro at this point, will change in future.   Fairy RONAL Sellar, MD [facility]

## 2023-10-25 NOTE — Progress Notes (Signed)
 Speech Language Pathology Daily Session Note  Patient Details  Name: Nathan Campbell MRN: [id number] Date of Birth: Jan 08, 1976  Today's Date: 10/25/2023 SLP Individual Time: 0800-0830 SLP Individual Time Calculation (min): 30 min  Short Term Goals: Week 1: SLP Short Term Goal 1 (Week 1): Patient will consume D1/NTL diet with use of compensatory strategies given min multimodal A SLP Short Term Goal 2 (Week 1): Patient will utilize speech intelligibility strategies to reach 75% intelligibility at the word level given mod multimodal A SLP Short Term Goal 3 (Week 1): Patient will demonstrate functional problem solving during daily tasks given mod multimodal A SLP Short Term Goal 4 (Week 1): Patient will recall and utilize memory compensatory aids given mod multimodal A SLP Short Term Goal 5 (Week 1): Patient will demonstrate awareness of cognitive and physical changes since admission given mod multimodal A  Skilled Therapeutic Interventions: Skilled therapy session focused on dysphagia and communication goals. SLP facilitated session by observing patient with D1/NTL breakfast tray. Patient with continued instances of large bolus sizes and quick administration of PO, requiring minA to slow rate. Patient with no s/sx of bolus misdirection. Continue current diet and begin trials of D2 solids. SLP targeted communication through reviewing SLOP speech intelligibility strategies. Patient independently recalled 4/4 strategies with use of external aid. Patient utilized strategies with modA during communication of 3 syllable single words. Patient wiht 60% accuracy this date. Patient left in [facility] with call bell in reach. Continue POC  Pain None reported   Therapy/Group: Individual Therapy  Valorie Mcgrory M.A., CCC-SLP 10/25/2023, 7:41 AM

## 2023-10-25 NOTE — Progress Notes (Signed)
 Received patient in bed to unit.  Alert and oriented.  Informed consent signed and in chart.   TX duration:3 hours and 30 minutes  Patient tolerated well.  Transported back to the room  Alert, without acute distress.  Hand-off given to patient's nurse.   Access used: Left AV Fistula Upper ARm Access issues: none  Total UF removed: 2L Medication(s) given: none   10/25/23 1810  Vitals  Temp 98 F (36.7 C)  Temp Source Oral  BP 92/61  MAP (mmHg) 72  Pulse Rate 95  ECG Heart Rate 93  Resp 19  Oxygen Therapy  SpO2 93 %  O2 Device Room Air  During Treatment Monitoring  Duration of HD Treatment -hour(s) 3.5 hour(s)  HD Safety Checks Performed Yes  Intra-Hemodialysis Comments Tx completed  Post Treatment  Dialyzer Clearance Clear  Liters Processed 84  Fluid Removed (mL) 2000 mL  Tolerated HD Treatment Yes  Post-Hemodialysis Comments UF Goal met, tolerated well  AVG/AVF Arterial Site Held (minutes) 7 minutes  AVG/AVF Venous Site Held (minutes) 7 minutes  Fistula / Graft Left Upper arm Arteriovenous fistula  Placement Date/Time: 11/11/20 0835   Placed prior to admission: No  Orientation: Left  Access Location: Upper arm  Access Type: (c) Arteriovenous fistula  Status Deaccessed     Camellia Brasil LPN [facility]

## 2023-10-25 NOTE — Progress Notes (Signed)
 PROGRESS NOTE   Subjective/Complaints:  No issues overnite  Denies fall yesterday , states that he deliberately was kneeling on the floor  Denies pain or injury   ROS-denies CP, SOB, V/D, abdominal pain   Objective:   No results found.  Recent Labs    10/24/23 0553  WBC 10.9*  HGB 11.3*  HCT 34.7*  PLT 363   Recent Labs    10/24/23 0553  NA 133*  K 5.9*  CL 88*  CO2 28  GLUCOSE 105*  BUN 88*  CREATININE 15.04*  CALCIUM  10.4*    Intake/Output Summary (Last 24 hours) at 10/25/2023 0801 Last data filed at 10/24/2023 1903 Gross per 24 hour  Intake 600 ml  Output 450 ml  Net 150 ml        Physical Exam: Vital Signs Blood pressure (!) 147/97, pulse 99, temperature 97.6 F (36.4 C), resp. rate 18, height 6' (1.829 m), weight 133.4 kg, SpO2 90%.   General: No acute distress, laying in bed  Mood and affect are appropriate Heart: Regular rate and rhythm no rubs murmurs or extra sounds Lungs: Clear to auscultation, breathing unlabored, no rales or wheezes Abdomen: Positive bowel sounds, soft nontender to palpation, nondistended Extremities: No clubbing, cyanosis, or edema Skin: No evidence of breakdown, no evidence of rash Neurologic: A little drowsy this morning, cranial nerves II through XII grossly intact, follows simple commands  motor strength is 5/5 in right deltoid, bicep, tricep, grip, hip flexor, knee extensors, ankle dorsiflexor and plantar flexor 3-/5 in LUE at delt and biceps and grip, 2-/5 L finger and arm extension , 4- LLE muscle groups as above  Sensory exam normal sensation to light touch  in bilateral lower extremities Absent LT LUE Cerebellar exam not tested on LUE due to weakness Musculoskeletal: pain free  range of motion in all 4 extremities. No joint swelling Prior neuro assessment is c/w today's exam 10/25/2023.   Assessment/Plan: 1. Functional deficits which require 3+ hours per  day of interdisciplinary therapy in a comprehensive inpatient rehab setting. Physiatrist is providing close team supervision and 24 hour management of active medical problems listed below. Physiatrist and rehab team continue to assess barriers to discharge/monitor patient progress toward functional and medical goals  Care Tool:  Bathing    Body parts bathed by patient: Chest, Abdomen, Front perineal area, Right upper leg, Left upper leg, Face   Body parts bathed by helper: Right arm, Left arm, Buttocks, Right lower leg, Left lower leg     Bathing assist Assist Level: Maximal Assistance - Patient 24 - 49%     Upper Body Dressing/Undressing Upper body dressing   What is the patient wearing?: Pull over shirt    Upper body assist Assist Level: Maximal Assistance - Patient 25 - 49%    Lower Body Dressing/Undressing Lower body dressing      What is the patient wearing?: Pants, Incontinence brief     Lower body assist Assist for lower body dressing: Maximal Assistance - Patient 25 - 49%     Toileting Toileting Toileting Activity did not occur (Clothing management and hygiene only): N/A (no void or bm) (dialysis patient)  Toileting assist Assist  for toileting: Maximal Assistance - Patient 25 - 49%     Transfers Chair/bed transfer  Transfers assist     Chair/bed transfer assist level: Contact Guard/Touching assist     Locomotion Ambulation   Ambulation assist      Assist level: Minimal Assistance - Patient > 75% Assistive device: Cane-quad Max distance: 35 ft   Walk 10 feet activity   Assist     Assist level: Minimal Assistance - Patient > 75% Assistive device: Cane-quad   Walk 50 feet activity   Assist Walk 50 feet with 2 turns activity did not occur: Safety/medical concerns         Walk 150 feet activity   Assist Walk 150 feet activity did not occur: Safety/medical concerns         Walk 10 feet on uneven surface  activity   Assist  Walk 10 feet on uneven surfaces activity did not occur: Safety/medical concerns         Wheelchair     Assist Is the patient using a wheelchair?: Yes Type of Wheelchair: Manual    Wheelchair assist level: Dependent - Patient 0% Max wheelchair distance: 300 ft    Wheelchair 50 feet with 2 turns activity    Assist        Assist Level: Dependent - Patient 0%   Wheelchair 150 feet activity     Assist      Assist Level: Dependent - Patient 0%   Blood pressure (!) 147/97, pulse 99, temperature 97.6 F (36.4 C), resp. rate 18, height 6' (1.829 m), weight 133.4 kg, SpO2 90%.  Medical Problem List and Plan: 1. Functional deficits secondary to acute R MCA CVA with right M1 occlusion s/p TNK 7/25 and IR with TICI2b likely large vessel disease.             -patient may shower             -ELOS/Goals: 10-14 days, supervision             - Continue CIR 2.  Antithrombotics: -DVT/anticoagulation:  Pharmaceutical: Heparin              -antiplatelet therapy: Aspirin  and Plavix  x 3 months then Aspirin  alone.  3. Pain Management: Tylenol  and Flexeril  (back pain) as needed 4. Mood/Behavior/Sleep: Therapy evaluations completed due to patient decreased functional mobility was admitted for a comprehensive rehab program.              -antipsychotic agents: N/A 5. Neuropsych/cognition: This patient is not capable of making decisions on his own behalf. 6. Skin/Wound Care: Routine pressure relief measures 7. Fluids/Electrolytes/Nutrition: Monitor I/O and Daily weights.              - TF + protein supplements.  Dysphagia - upgraded to dysphagia 1 diet -TF d/ced Taking po very well , 100% meals although fluid intake recorded is still low  8. ESRD on hemodialysis: BUN 139, Cr 18.98 HD on TTHS schedule- --Labs to be collected in HD             -Hyperphos/Hyperkalemia - per Nephro 9. Chronic Anemia of ESRD: hgb 10.9 from 11.6 continue to monitor  10. Acute hypoxic/hypercapnic respiratory  failure: On 2 L [place] CXR Vague lingular and left lower lobe airspace opacification   Repeat CXR done 8/5 pending.  Suspect underlying OSA will check overnite oximetry  11. Leukocytosis:     Latest Ref Rng & Units 10/24/2023    5:53 AM 10/22/2023  5:00 AM 10/20/2023    6:16 AM  CBC  WBC 4.0 - 10.5 K/uL 10.9  9.1  9.8   Hemoglobin 13.0 - 17.0 g/dL 88.6  88.8  89.3   Hematocrit 39.0 - 52.0 % 34.7  34.0  33.3   Platelets 150 - 400 K/uL 363  353  381    Monitor , afebrile 12. Combined systolic and diastolic H: home meds amlodipine , isosorbide  and carvedilol .  Fluid management per nephrology.             - Monitor for orthostasis with activity.  13. Secondary HPTH: Home auryxia  cannot be given via NG tube--continue Fosrenol   14. HLD: LDL 85 Atorvastatin  40 mg  15. Obesity: BMI 39.77 educate on diet and weight loss to promote overall health and mobility.  16.  Hypertension: Long-term goal normotensive.  Continue Norvasc , Coreg , isosorbide   Vitals:   10/24/23 1952 10/25/23 0629  BP: 121/78 (!) 147/97  Pulse: 95 99  Resp: 18 18  Temp: 97.6 F (36.4 C)   SpO2: 92% 90%   Increase coreg  to 12.5 still hypertensive with elevated HR -8/10 decrease Norvasc  to 5 mg as BP intermittently soft, suspected orthostatic hypotension Check ortho vitals this am 17.  Hyperlipidemia: Continue statin    18.  Vasovagal episode due to HD (2L off yesterday ) as well as reduce fluid intake po and minmal TF volumes, no off TF, will increase free H20 today and enc po fluid intake to >1036ml , Nephro managing HD   Check ortho vitals as above HD today  19. Itching  -no rash noted, continue as needed Benadryl  and Sarna lotion  LOS: 7 days A FACE TO FACE EVALUATION WAS PERFORMED  Nathan Campbell 10/25/2023, 8:01 AM

## 2023-10-25 NOTE — Plan of Care (Signed)
   Problem: Consults Goal: RH STROKE PATIENT EDUCATION Description: See Patient Education module for education specifics  Outcome: Progressing   Problem: RH BOWEL ELIMINATION Goal: RH STG MANAGE BOWEL WITH ASSISTANCE Description: STG Manage Bowel with mod I Assistance. Outcome: Progressing Goal: RH STG MANAGE BOWEL W/MEDICATION W/ASSISTANCE Description: STG Manage Bowel with Medication with mod I Assistance. Outcome: Progressing   Problem: RH SAFETY Goal: RH STG ADHERE TO SAFETY PRECAUTIONS W/ASSISTANCE/DEVICE Description: STG Adhere to Safety Precautions With cues Assistance/Device. Outcome: Progressing   Problem: RH PAIN MANAGEMENT Goal: RH STG PAIN MANAGED AT OR BELOW PT'S PAIN GOAL Description: Pain < 4 with prns Outcome: Progressing   Problem: RH KNOWLEDGE DEFICIT Goal: RH STG INCREASE KNOWLEDGE OF HYPERTENSION Description: Patient and family will be able to manage HTN using educational resources for medications and dietary modification independently Outcome: Progressing Goal: RH STG INCREASE KNOWLEDGE OF DYSPHAGIA/FLUID INTAKE Description: Patient and family will be able to manage dysphagia using educational resources for medications and dietary modification independently Outcome: Progressing Goal: RH STG INCREASE KNOWLEGDE OF HYPERLIPIDEMIA Description: Patient and family will be able to manage HLD using educational resources for medications and dietary modification independently Outcome: Progressing Goal: RH STG INCREASE KNOWLEDGE OF STROKE PROPHYLAXIS Description: Patient and family will be able to manage secondary risks using educational resources for medications and dietary modification independently Outcome: Progressing

## 2023-10-26 MED ORDER — AMLODIPINE BESYLATE 2.5 MG PO TABS: 2.5000 mg | ORAL_TABLET | Freq: Every day | ORAL | Status: DC

## 2023-10-26 NOTE — Progress Notes (Signed)
 Speech Language Pathology Weekly Progress and Session Note  Patient Details  Name: Nathan Campbell MRN: [id number] Date of Birth: 1975-04-24  Beginning of progress report period: October 19, 2023 End of progress report period: October 26, 2023  Today's Date: 10/26/2023 SLP Individual Time: 9196-9156 SLP Individual Time Calculation (min): 40 min  Short Term Goals: Week 1: SLP Short Term Goal 1 (Week 1): Patient will consume D1/NTL diet with use of compensatory strategies given min multimodal A SLP Short Term Goal 1 - Progress (Week 1): Met SLP Short Term Goal 2 (Week 1): Patient will utilize speech intelligibility strategies to reach 75% intelligibility at the word level given mod multimodal A SLP Short Term Goal 2 - Progress (Week 1): Met SLP Short Term Goal 3 (Week 1): Patient will demonstrate functional problem solving during daily tasks given mod multimodal A SLP Short Term Goal 3 - Progress (Week 1): Met SLP Short Term Goal 4 (Week 1): Patient will recall and utilize memory compensatory aids given mod multimodal A SLP Short Term Goal 4 - Progress (Week 1): Met SLP Short Term Goal 5 (Week 1): Patient will demonstrate awareness of cognitive and physical changes since admission given mod multimodal A SLP Short Term Goal 5 - Progress (Week 1): Met  New Short Term Goals: Week 2: SLP Short Term Goal 1 (Week 2): Patient will utilize speech intelligibility strategies to reach 75% intelligibility at the phrase level given mod multimodal A SLP Short Term Goal 2 (Week 2): Patient will demonstrate functional problem solving during daily tasks given min multimodal A SLP Short Term Goal 3 (Week 2): Patient will recall and utilize memory compensatory aids given min multimodal A SLP Short Term Goal 4 (Week 2): Patient will demonstrate awareness of cognitive and physical changes since admission given min multimodal A SLP Short Term Goal 5 (Week 2): Patient will tolerate upgraded bolus trials (Dys2 solids  and/or thin liquids) with no overt difficulty/ s/sx of penetration/aspiration prior to determine readiness for diet upgrade.  Weekly Progress Updates: Patient has made excellent progress this reporting period meeting 5/5 short term goals set. Patient currently benefits from min cues to utilize safe swallowing strategies during meals and has begun to trial Dys2 solids. Patient benefits from min to mod cues overall for basic problem solving, awareness of cognitive and physical deficits, and to utilize memory compensatory aids. Patient also benefits from mod assist to utilize SLOP speech intelligibility strategies at the word level. Patient and family education ongoing. Patient will continue to benefit from skilled therapy services during remainder of CIR stay.     Intensity: Minumum of 1-2 x/day, 30 to 90 minutes Frequency: 3 to 5 out of 7 days Duration/Length of Stay: 20-21 days Treatment/Interventions: Cognitive remediation/compensation;Cueing hierarchy;DME/adaptive equipment instruction;Dysphagia/aspiration precaution training;Functional tasks;Internal/external aids;Multimodal communication approach;Oral motor exercises;Patient/family education;Speech/Language facilitation;Therapeutic Activities  Daily Session  Skilled Therapeutic Interventions: SLP conducted skilled therapy session targeting swallowing and communication goals. SLP facilitated assessment of diet tolerance with Dys1/NTL breakfast items. Patient coughed intermittently throughout but cough was similar to cough present prior to bolus administration, thus given results of MBS suspect cough is unrelated to airway intrusion. He continues to demonstrate impulsive fast rate and benefits from min cues to slow down. Trialed upgraded solid textures via Dys2 bolus trials. Patient demonstrated thorough mastication and swift oral clearance, with no demonstration of buccal pocketing. Throughout meal, patient demonstrated improving problem solving skills  and overall recall of compensatory strategies with min cues. After completion of meal items, SLP engaged patient in  conversation re: preferred sports teams, movies, music, and TV shows. Patient was approximately 75% intelligible and benefited from mod cues to utilize speech intelligibility compensatory strategies. Patient was left in room with call bell in reach and alarm set. SLP will continue to target goals per plan of care.        Pain  None endorsed  Therapy/Group: Individual Therapy  Horrace Hanak, M.A., CCC-SLP  Kaleb Linquist A Charee Tumblin 10/26/2023, 8:50 AM

## 2023-10-26 NOTE — Progress Notes (Signed)
 Speech Language Pathology Daily Session Note  Patient Details  Name: Nathan Campbell MRN: [id number] Date of Birth: 12/05/1975  Today's Date: 10/26/2023 SLP Individual Time: 1345-1445 SLP Individual Time Calculation (min): 60 min  Short Term Goals: Week 2: SLP Short Term Goal 1 (Week 2): Patient will utilize speech intelligibility strategies to reach 75% intelligibility at the phrase level given mod multimodal A SLP Short Term Goal 2 (Week 2): Patient will demonstrate functional problem solving during daily tasks given min multimodal A SLP Short Term Goal 3 (Week 2): Patient will recall and utilize memory compensatory aids given min multimodal A SLP Short Term Goal 4 (Week 2): Patient will demonstrate awareness of cognitive and physical changes since admission given min multimodal A SLP Short Term Goal 5 (Week 2): Patient will tolerate upgraded bolus trials (Dys2 solids and/or thin liquids) with no overt difficulty/ s/sx of penetration/aspiration prior to determine readiness for diet upgrade.  Skilled Therapeutic Interventions: 1100: SLP attempted to visit patient for ST session. Patient with nausea and dizziness after suspected vasovagal response. NP present. SLP will return as patient is able to participate.   1345: SLP conducted skilled therapy session targeting swallowing and communication goals. SLP facilitated assessment of diet tolerance with Dys3/NTL items. Patient demonstrated thorough mastication and swift oral clearance, with no demonstration of buccal pocketing. Patient coughed intermittently throughout however, given results of MBS suspect cough is unrelated to airway intrusion. He continues to demonstrate impulsive fast rate and benefits from min cues to slow down. SLP upgraded diet to D2/NTL w/ medications whole in puree. SLP targeted communication through evaluation of lingual strength through [employer] ([place]  Oral Performance InstrumentO. Patient with an average anterior and posterior  kpa of  26. Patient then completed x10 repetitions of anterior and posterior sequeezes set at 22kpa. Patient with increased speech intelligibility this date, reaching 70% at the phrase level with use of strategies. Patient left in chair with alarm set and call bell in reach. Continue POC.  Pain None reported   Therapy/Group: Individual Therapy  Faris Coolman M.A., CCC-SLP 10/26/2023, 9:24 AM

## 2023-10-26 NOTE — Progress Notes (Addendum)
 PROGRESS NOTE   Subjective/Complaints:  Appreciate nephro note, no further falls, has nausea  but no vomiting today , c/o itching all over body   ROS-denies CP, SOB, V/D, abdominal pain   Objective:   No results found.  Recent Labs    10/24/23 0553  WBC 10.9*  HGB 11.3*  HCT 34.7*  PLT 363   Recent Labs    10/24/23 0553  NA 133*  K 5.9*  CL 88*  CO2 28  GLUCOSE 105*  BUN 88*  CREATININE 15.04*  CALCIUM  10.4*    Intake/Output Summary (Last 24 hours) at 10/26/2023 0842 Last data filed at 10/25/2023 1810 Gross per 24 hour  Intake 240 ml  Output 2000 ml  Net -1760 ml        Physical Exam: Vital Signs Blood pressure 123/84, pulse 98, temperature 97.9 F (36.6 C), temperature source Oral, resp. rate 18, height 6' (1.829 m), weight 126.6 kg, SpO2 94%.   General: No acute distress, laying in bed  Mood and affect are appropriate Heart: Regular rate and rhythm no rubs murmurs or extra sounds Lungs: Clear to auscultation, breathing unlabored, no rales or wheezes Abdomen: Positive bowel sounds, soft nontender to palpation, nondistended Extremities: No clubbing, cyanosis, or edema Skin: No evidence of breakdown, no evidence of rash Neurologic: A little drowsy this morning, cranial nerves II through XII grossly intact, follows simple commands  motor strength is 5/5 in right deltoid, bicep, tricep, grip, hip flexor, knee extensors, ankle dorsiflexor and plantar flexor 3-/5 in LUE at delt and biceps and grip, 2-/5 L finger and arm extension , 4- LLE muscle groups as above  Sensory exam normal sensation to light touch  in bilateral lower extremities Absent LT LUE Cerebellar exam not tested on LUE due to weakness Musculoskeletal: pain free  range of motion in all 4 extremities. No joint swelling Prior neuro assessment is c/w today's exam 10/26/2023.   Assessment/Plan: 1. Functional deficits which require 3+  hours per day of interdisciplinary therapy in a comprehensive inpatient rehab setting. Physiatrist is providing close team supervision and 24 hour management of active medical problems listed below. Physiatrist and rehab team continue to assess barriers to discharge/monitor patient progress toward functional and medical goals  Care Tool:  Bathing    Body parts bathed by patient: Chest, Abdomen, Front perineal area, Right upper leg, Left upper leg, Face   Body parts bathed by helper: Right arm, Left arm, Buttocks, Right lower leg, Left lower leg     Bathing assist Assist Level: Maximal Assistance - Patient 24 - 49%     Upper Body Dressing/Undressing Upper body dressing   What is the patient wearing?: Pull over shirt    Upper body assist Assist Level: Maximal Assistance - Patient 25 - 49%    Lower Body Dressing/Undressing Lower body dressing      What is the patient wearing?: Pants, Incontinence brief     Lower body assist Assist for lower body dressing: Maximal Assistance - Patient 25 - 49%     Toileting Toileting Toileting Activity did not occur (Clothing management and hygiene only): N/A (no void or bm) (dialysis patient)  Toileting assist Assist for  toileting: Maximal Assistance - Patient 25 - 49%     Transfers Chair/bed transfer  Transfers assist     Chair/bed transfer assist level: Contact Guard/Touching assist     Locomotion Ambulation   Ambulation assist      Assist level: Contact Guard/Touching assist Assistive device: Cane-quad Max distance: 25'   Walk 10 feet activity   Assist     Assist level: Contact Guard/Touching assist Assistive device: Cane-quad   Walk 50 feet activity   Assist Walk 50 feet with 2 turns activity did not occur: Safety/medical concerns         Walk 150 feet activity   Assist Walk 150 feet activity did not occur: Safety/medical concerns         Walk 10 feet on uneven surface  activity   Assist Walk  10 feet on uneven surfaces activity did not occur: Safety/medical concerns         Wheelchair     Assist Is the patient using a wheelchair?: Yes Type of Wheelchair: Manual    Wheelchair assist level: Dependent - Patient 0% Max wheelchair distance: 300 ft    Wheelchair 50 feet with 2 turns activity    Assist        Assist Level: Dependent - Patient 0%   Wheelchair 150 feet activity     Assist      Assist Level: Dependent - Patient 0%   Blood pressure 123/84, pulse 98, temperature 97.9 F (36.6 C), temperature source Oral, resp. rate 18, height 6' (1.829 m), weight 126.6 kg, SpO2 94%.  Medical Problem List and Plan: 1. Functional deficits secondary to acute R MCA CVA with right M1 occlusion s/p TNK 7/25 and IR with TICI2b likely large vessel disease.             -patient may shower             -ELOS/Goals: 11/04/23   supervision             - Continue CIR Team conference today please see physician documentation under team conference tab, met with team  to discuss problems,progress, and goals. Formulized individual treatment plan based on medical history, underlying problem and comorbidities.  2.  Antithrombotics: -DVT/anticoagulation:  Pharmaceutical: Heparin  will d/c since ambulation is >75' with PT             -antiplatelet therapy: Aspirin  and Plavix  x 3 months then Aspirin  alone.  3. Pain Management: Tylenol  and Flexeril  (back pain) as needed 4. Mood/Behavior/Sleep: Therapy evaluations completed due to patient decreased functional mobility was admitted for a comprehensive rehab program.              -antipsychotic agents: N/A 5. Neuropsych/cognition: This patient is not capable of making decisions on his own behalf. 6. Skin/Wound Care: Routine pressure relief measures Pruritus without rash , likely due to renal disease , cont Sarna lotion and prn benadryl  7. Fluids/Electrolytes/Nutrition: Monitor I/O and Daily weights.              - TF + protein  supplements.  Dysphagia - upgraded to dysphagia 1 diet -TF d/ced Taking po very well , 100% meals although fluid intake recorded is still low  8. ESRD on hemodialysis: BUN 139, Cr 18.98 HD on TTHS schedule- --Labs to be collected in HD             -Hyperphos/Hyperkalemia - per Nephro 9. Chronic Anemia of ESRD: hgb 10.9 from 11.6 continue to monitor  10. Acute hypoxic/hypercapnic  respiratory failure: On 2 L [place] CXR Vague lingular and left lower lobe airspace opacification   Repeat CXR done 8/5 pending.  Suspect underlying OSA will check overnite oximetry  11. Leukocytosis:     Latest Ref Rng & Units 10/24/2023    5:53 AM 10/22/2023    5:00 AM 10/20/2023    6:16 AM  CBC  WBC 4.0 - 10.5 K/uL 10.9  9.1  9.8   Hemoglobin 13.0 - 17.0 g/dL 88.6  88.8  89.3   Hematocrit 39.0 - 52.0 % 34.7  34.0  33.3   Platelets 150 - 400 K/uL 363  353  381    Monitor , afebrile 12. Combined systolic and diastolic H: home meds amlodipine , isosorbide  and carvedilol .  Fluid management per nephrology.             - Monitor for orthostasis with activity.  13. Secondary HPTH: Home auryxia  cannot be given via NG tube--continue Fosrenol   14. HLD: LDL 85 Atorvastatin  40 mg  15. Obesity: BMI 39.77 educate on diet and weight loss to promote overall health and mobility.  16.  Hypertension: Long-term goal normotensive.  Continue Norvasc , Coreg , isosorbide   Vitals:   10/25/23 1928 10/26/23 0448  BP: 108/67 123/84  Pulse: 93 98  Resp: 18   Temp: 98.7 F (37.1 C) 97.9 F (36.6 C)  SpO2: 92% 94%   Increase coreg  to 12.5 still hypertensive with elevated HR -8/13 d/c Norvasc  d/t orthostatic hypotension Also hold isordil  , cont coreg  due to tachy Check ortho vitals this am 17.  Hyperlipidemia: Continue statin    18.  Vasovagal episode due to HD (2L off yesterday ) as well as reduce fluid intake po and minmal TF volumes, no off TF, will increase free H20 today and enc po fluid intake to >1060ml , Nephro managing HD   Check  ortho vitals as above Dropped to sys sitting yesterday  HD in am  Will d/c amlodipine  19. Itching  -no rash noted, continue as needed Benadryl  and Sarna lotion  LOS: 8 days A FACE TO FACE EVALUATION WAS PERFORMED  Nathan Campbell 10/26/2023, 8:42 AM

## 2023-10-26 NOTE — Progress Notes (Signed)
   10/24/23 1559  What Happened  Was fall witnessed? No  Was patient injured? No  Patient found on floor  Found by Staff-comment Tennie NT)  Stated prior activity ambulating-unassisted  Provider Notification  Provider Name/Title Daphne Finders NP  Date Provider Notified 10/24/23  Time Provider Notified 1550  Method of Notification Call  Notification Reason Fall  Provider response En route  Date of Provider Response 10/24/23  Time of Provider Response 1600  Follow Up  Family notified Yes - comment (family at bedside helped patient to kneel on ground)  Time family notified 1650  Additional tests No  Simple treatment Other (comment)  Progress note created (see row info) Yes  Adult Fall Risk Assessment  Risk Factor Category (scoring not indicated) High fall risk per protocol (document High fall risk)  Patient Fall Risk Level High fall risk  Adult Fall Risk Interventions  Required Bundle Interventions *See Row Information* High fall risk - low, moderate, and high requirements implemented  Additional Interventions Use of appropriate toileting equipment (bedpan, BSC, etc.)  Fall intervention(s) refused/Patient educated regarding refusal Bed alarm  Screening for Fall Injury Risk (To be completed on HIGH fall risk patients) - Assessing Need for Floor Mats  Risk For Fall Injury- Criteria for Floor Mats None identified - No additional interventions needed  Vitals  Temp 98.1 F (36.7 C)  Temp Source Oral  BP 103/70  MAP (mmHg) 80  BP Location Right Arm  BP Method Automatic  Patient Position (if appropriate) Lying  Pulse Rate 88  Pulse Rate Source Monitor  Resp 16  Oxygen Therapy  SpO2 92 %  O2 Device Room Air  Pain Assessment  Pain Scale 0-10  Pain Score 0  Neurological  Neuro (WDL) X  Level of Consciousness Alert  Orientation Level Oriented X4  Cognition Appropriate at baseline  Speech Slurred/Dysarthria  Motor Function/Sensation Assessment Grip  R Hand Grip Moderate   L Hand Grip Absent  Neuro Symptoms None  Musculoskeletal  Musculoskeletal (WDL) X  Assistive Device Wheelchair  Generalized Weakness Yes  Weight Bearing Restrictions Per Provider Order No  Integumentary  Integumentary (WDL) WDL  Skin Color Appropriate for ethnicity  Skin Condition Dry  Skin Integrity Intact  Ecchymosis Location Back  Ecchymosis Location Orientation Left  Skin Turgor Non-tenting

## 2023-10-26 NOTE — Patient Care Conference (Signed)
 Inpatient RehabilitationTeam Conference and Plan of Care Update Date: 10/26/2023   Time: 10:04 AM    Patient Name: Nathan Campbell      Medical Record Number: [id number]  Date of Birth: 01-Apr-1975 Sex: Male         Room/Bed: 4M10C/4M10C-01 Payor Info: Payor: Multimedia programmer / Plan: [employer]HC MEDICARE / Product Type: *No Product type* /    Admit Date/Time:  10/18/2023  6:10 PM  Primary Diagnosis:  <principal problem not specified>  Hospital Problems: Active Problems:   Middle cerebral artery embolism, right   Cognitive and neurobehavioral dysfunction    Expected Discharge Date: Expected Discharge Date: 11/04/23  Team Members Present: Physician leading conference: Dr. Prentice Compton Social Worker Present: [name] [name], LCSW Nurse Present: Barnie Ronde, RN PT Present: Recardo Milliner, PT OT Present: Katheryn Mines, OT SLP Present: Blaise Alderman, SLP PPS Coordinator present : Eleanor Colon, SLP     Current Status/Progress Goal Weekly Team Focus  Bowel/Bladder      Oliguric Continent of bowel and bladder          Swallow/Nutrition/ Hydration   D1/NTL   supervision  education, safe swallowing strategies, trials of D2, pharyngeal strengthening    ADL's   Bed mobility: Min A to rise into sitting  Grooming/oral hygiene: Mod A assist with applying powder and lotion  Toilet transfer: CGA with quad cane ambulating from bed no LOB/SOB  Toileting: CGA for void of BM, able to complete pants management and hygiene  UB dressing: Mod A, education provided on hemi dressing technique for UB with limited carryover, would benefit from reinforcement  LB dressing: Max A, assistance with over BLE and management over Lt side of waist  Footwear: total A for socks in sitting position  Shower transfer: CGA ambulating with quad cane from toilet no LOB  Bathing: Min A, assistance with feet and back, able to stand for peri hygiene using grab bar to rise into standing   SUP overall for ADL, CGA  tub transfers   dynamic balance, activity tolerance, BP management, L NMR, functional transfer training    Mobility   Bed mobility = supervision, Transfers = CGA, Ambulation = CGA using LBQC >150 ft bouts   overall supervision  Barriers: lethargy, communication /// Work On: NMR to L hemibody, LOA for transfers/ gait, seated/ standing balance    Communication   90% intelligible with single syllable words - decreasing to 50% w/ 3 syllable words   minA phrase 80% intelligible   IOPI, SLOP strategies    Safety/Cognition/ Behavioral Observations  maxA   minA   problem solving, memory, attention, awareness    Pain      N/A           Skin      N/A           Discharge Planning:  Pt will d/c to home with support from his mother who will be primary caregiver. SHe will initially take to dialysis. SW will submit [facility] application for assitance with transporation. SW will confirm there are no barriers to discharge.   Team Discussion: Patient admitted post right MCA CVA with cognitive deficits, dysarthria, left hemianopsia with left upper extremity weakness. Functional progress limited by fatigue, nausea and orthostasis.  Patient on target to meet rehab goals: yes, currently needs min assist for UB care and CGA for toileting. Needs mod assist for upper body dressing using a hemi techniqu and max assist for lower body dressing.  Needs CGA for  transfers and able to ambulate up to 150' using a Large -based quad care.  Goals for discharge set for supervision overall.  *See Care Plan and progress notes for long and short-term goals.   Revisions to Treatment Plan:  IOPI Upgraded to Dysphagia 2 - Nectar thick liquid diet   Teaching Needs: Safety, medications, dietary modification, transfers, toileting, etc.   Current Barriers to Discharge: Decreased caregiver support, Home enviroment access/layout, and Hemodialysis  Possible Resolutions to Barriers: Family  education Engineer, mining for HD initiated     Medical Summary Current Status: ESRD on HD, LUE> LLE weakness, orthostatic hypotension  Barriers to Discharge: Medical stability;Renal Insufficiency/Failure   Possible Resolutions to [employer]: work with nephrology to manage Hypotension   Continued Need for Acute Rehabilitation Level of Care: The patient requires daily medical management by a physician with specialized training in physical medicine and rehabilitation for the following reasons: Direction of a multidisciplinary physical rehabilitation program to maximize functional independence : Yes Medical management of patient stability for increased activity during participation in an intensive rehabilitation regime.: Yes Analysis of laboratory values and/or radiology reports with any subsequent need for medication adjustment and/or medical intervention. : Yes   I attest that I was present, lead the team conference, and concur with the assessment and plan of the team.   Fredericka Sober B 10/26/2023, 2:01 PM

## 2023-10-26 NOTE — Progress Notes (Signed)
 Occupational Therapy Session Note  Patient Details  Name: Nathan Campbell MRN: [id number] Date of Birth: [birth year]  Today's Date: 10/26/2023 OT Individual Time: 8694-8653 OT Individual Time Calculation (min): 41 min    Short Term Goals: Week 1:  OT Short Term Goal 1 (Week 1): Pt will be able to don shirt with min A. OT Short Term Goal 2 (Week 1): Pt will be able to don pants over feet with min A. OT Short Term Goal 3 (Week 1): pt will be able to perform self ROM of LUE. OT Short Term Goal 4 (Week 1): Pt will be able to bathe with min A using long handled sponge.  Skilled Therapeutic Interventions/Progress Updates:  Pt greeted supine in bed with pts mother present, pt agreeable to OT intervention.    Pt requested to shower.   Transfers/bed mobility/functional mobility: pt completed supine>sit with CGA. Pt completed stand pivot transfers with no Ad with CGA- MINA.    ADLs:  UB dressing:pt donned [place] shirt with MODA, cues needed to don LUE first LB dressing: donned pants/brief with MAX A  Footwear: donned socks with total A for time mgmt  Bathing: pt completed bathing from sitting/standing with MODA needing assist to wash buttock in standing and assist to wash R side.  Transfers: pt completed stand pivot transfer from w/c>shower seat with MIN A with RUE holding onto grab bar.                  Ended session with pt seated in w/c with all needs within reach, direct hand off to SLP.                 Therapy Documentation Precautions:  Precautions Precautions: Fall Precaution/Restrictions Comments: L hemipareisis UE>LE, L hemianopsia, cortrak, watch SpO2 and RR Restrictions Weight Bearing Restrictions Per Provider Order: No  Pain: no pain     Therapy/Group: Individual Therapy  [name] 10/26/2023, 2:20 PM

## 2023-10-26 NOTE — Progress Notes (Signed)
 Physical Therapy Session Note  Patient Details  Name: Nathan Campbell MRN: [id number] Date of Birth: 21-Dec-1975  Today's Date: 10/26/2023 PT Individual Time: 9054-8966 PT Individual Time Calculation (min): 48 min   Short Term Goals: Week 1:  PT Short Term Goal 1 (Week 1): Pt will demosntrate bed mobility with CGA and good technique. PT Short Term Goal 2 (Week 1): pt will perform standing transfers with consistent SBA/ CGA. PT Short Term Goal 3 (Week 1): Pt will perform ambulation up to 100 ft using LRAD with CGA. PT Short Term Goal 4 (Week 1): Pt will demonstrate stair navigation up /down at least 8 steps with overall MinA. PT Short Term Goal 5 (Week 1): Pt will complete appropriate outcome measure. Week 2:     Skilled Therapeutic Interventions/Progress Updates:    Pt in bed, agreeable for PT. Pt performed supine ex's: bridging 3x10, clams with green TB 2x10.  Pt able to transfer from supine to sit with supervision, using railing for assist.  Seated at EOB independently with good trunk control, sit->w/c transfer with supervision/CGA for safety. Pt wheeled to gym, performed ramp negotiation with QC, CGA. Pt requires cueing to slow pace, proper sequencing with QC, increasing RLE stride length and upright posture. Pt becomes impulsive at times and will start to lose balance, able to self correct 75%. Pt fatigues easily and needing rest break sitting in w/c. Pt performed x 4 marching with 2# ankle wts, then states he needed to have a BM.   Pt wheeled back to room, transferred from w/c->commode with supervision/CGA.  Nsg notified, pt given call string and locked w/c placed in front of patient.    Therapy Documentation Precautions:  Precautions Precautions: Fall Precaution/Restrictions Comments: L hemipareisis UE>LE, L hemianopsia, cortrak, watch SpO2 and RR Restrictions Weight Bearing Restrictions Per Provider Order: No General: PT Amount of Missed Time (min): 12 Minutes PT Missed Treatment  Reason: Toileting    Pain: Pt denies    Therapy/Group: Individual Therapy  Arland GORMAN Fast 10/26/2023, 2:46 PM

## 2023-10-26 NOTE — Progress Notes (Signed)
 Patient ID: Nathan Campbell, male   DOB: 11-23-1975, 48 y.o.   MRN: [id number] S: Feels weak this morning.  No N/V. O:BP 109/61   Pulse (!) 103   Temp 97.9 F (36.6 C) (Oral)   Resp 18   Ht 6' (1.829 m)   Wt 126.6 kg   SpO2 94%   BMI 37.85 kg/m   Intake/Output Summary (Last 24 hours) at 10/26/2023 1058 Last data filed at 10/26/2023 0854 Gross per 24 hour  Intake 358 ml  Output 2000 ml  Net -1642 ml   Intake/Output: I/O last 3 completed shifts: In: 720 [P.O.:720] Out: 2000 [Other:2000]  Intake/Output this shift:  Total I/O In: 118 [P.O.:118] Out: -  Weight change:  Gen: NAD CVS: tachy Resp: CTA Abd: +BS, soft, NT/ND Ext: no edema, LUE AVG +T/B  Recent Labs  Lab 10/20/23 0616 10/20/23 1107 10/22/23 0500 10/24/23 0553  NA 132* 133* 133* 133*  K 5.1 3.8 5.2* 5.9*  CL 91* 91* 90* 88*  CO2 23 27 24 28   GLUCOSE 129* 152* 106* 105*  BUN 117* 50* 102* 88*  CREATININE 16.05* 7.66* 15.03* 15.04*  ALBUMIN 3.3* 3.7 3.5 3.6  CALCIUM  9.9 9.9 10.1 10.4*  PHOS 11.4* 4.2 9.8* 10.0*   Liver Function Tests: Recent Labs  Lab 10/20/23 1107 10/22/23 0500 10/24/23 0553  ALBUMIN 3.7 3.5 3.6   No results for input(s): LIPASE, AMYLASE in the last 168 hours. No results for input(s): AMMONIA in the last 168 hours. CBC: Recent Labs  Lab 10/20/23 0616 10/22/23 0500 10/24/23 0553  WBC 9.8 9.1 10.9*  NEUTROABS 6.0 5.0 6.5  HGB 10.6* 11.1* 11.3*  HCT 33.3* 34.0* 34.7*  MCV 99.1 98.0 98.9  PLT 381 353 363   Cardiac Enzymes: No results for input(s): CKTOTAL, CKMB, CKMBINDEX, TROPONINI in the last 168 hours. CBG: Recent Labs  Lab 10/22/23 0418 10/22/23 1229 10/22/23 1659 10/22/23 1957 10/25/23 2203  GLUCAP 90 107* 87 130* 143*    Iron  Studies: No results for input(s): IRON , TIBC, TRANSFERRIN, FERRITIN in the last 72 hours. Studies/Results: No results found.  aspirin   81 mg Oral Daily   atorvastatin   40 mg Oral Daily   carvedilol   12.5 mg Oral  BID   Chlorhexidine  Gluconate Cloth  6 each Topical Q0600   clopidogrel   75 mg Oral Daily   docusate  100 mg Oral BID   lanthanum   1,000 mg Oral TID WC   mouth rinse  15 mL Mouth Rinse 4 times per day   pantoprazole   40 mg Oral QHS   sodium zirconium cyclosilicate   10 g Oral BID    BMET    Component Value Date/Time   NA 133 (L) 10/24/2023 0553   K 5.9 (H) 10/24/2023 0553   CL 88 (L) 10/24/2023 0553   CO2 28 10/24/2023 0553   GLUCOSE 105 (H) 10/24/2023 0553   BUN 88 (H) 10/24/2023 0553   CREATININE 15.04 (H) 10/24/2023 0553   CALCIUM  10.4 (H) 10/24/2023 0553   GFRNONAA 4 (L) 10/24/2023 0553   GFRAA 32 (L) 05/13/2017 1653   CBC    Component Value Date/Time   WBC 10.9 (H) 10/24/2023 0553   RBC 3.51 (L) 10/24/2023 0553   HGB 11.3 (L) 10/24/2023 0553   HGB 9.2 (L) 01/22/2021 1235   HCT 34.7 (L) 10/24/2023 0553   HCT 28.9 (L) 01/22/2021 1235   PLT 363 10/24/2023 0553   PLT 208 01/22/2021 1235   MCV 98.9 10/24/2023 0553   MCV 93  01/22/2021 1235   MCH 32.2 10/24/2023 0553   MCHC 32.6 10/24/2023 0553   RDW 13.2 10/24/2023 0553   RDW 12.0 01/22/2021 1235   LYMPHSABS 1.8 10/24/2023 0553   LYMPHSABS 0.8 01/22/2021 1235   MONOABS 1.6 (H) 10/24/2023 0553   EOSABS 0.9 (H) 10/24/2023 0553   EOSABS 0.1 01/22/2021 1235   BASOSABS 0.1 10/24/2023 0553   BASOSABS 0.0 01/22/2021 1235     Dialysis Orders: TTS - AF 4:15hr, 500/600, EDW 138kg, 2K/2C bath, AVF, heparin  4000+ - Mircera 50mcg given 10/06/23 - Hectoral 7mcg IV q HD - Takes Auryxia  3/meals and Xphozah 30mg  BID   Assessment/Plan: Acute R MCA CVA: S/p TNK 7/25. Required intubation, now on [place] O2. Ongoing L sided weakness. Per neuro. Working with PT currently. Now in CIR. AHRF, ?aspiration + CVA: Now extubated, on [place] O2. S/p Unasyn  course. WBC normalized.  ESRD: Continue HD on TTS schedule. Next HD 8/14. Hyperkalemia: S/p Lokelma  X 3 doses. K+ now is 5.2. Checking labs in AM. Monitor trend, may need to consider  Lokelma  on non-HD days if no improvement.  May need to change supplements. HTN/volume: BP decent, occ high - UF as tolerated, keep lowering EDW. Anemia of ESRD: Hgb 11.1, no ESA for now. Secondary HPTH: Corrected Ca 10.4 and Phos 11.4, VDRA on hold. Home auryxia  can not be given via NG tube - change to fosrenol  for now. Nutrition: Alb low, getting TF + protein supplements. Per RD - unable to do Nepro at this point, will change in future.  Fairy RONAL Sellar, MD [facility]

## 2023-10-26 NOTE — Progress Notes (Addendum)
 Patient ID: Nathan Campbell, male   DOB: [birth year], 48 y.o.   MRN: [id number]  SW went by pt room to follow-up with updates from team conference, but pt not in room. SW will follow-up with updates.   10- SW spoke with pt mother and she states she is on her way up to room. SW will follow-up.   *SW met with pt and pt mother in room to provide updates from team conference, and d/c date 8/22. Discussed fam edu- next Wed 1pm-4pm. No HHA preference. SW will confirm DME recs.   SW faxed disability forms- [employer] [phone].  *SW received updates from Nathan Campbell/[facility] reporting pt is active in their system and recertified June 2024.   [name] [name], MSW, LCSW Office: [phone] Cell: [phone] Fax: [phone]

## 2023-10-27 DIAGNOSIS — I6601 Occlusion and stenosis of right middle cerebral artery: Secondary | ICD-10-CM

## 2023-10-27 NOTE — Progress Notes (Signed)
 Received d/c date from SW team. Contacted pt op HD clinic, [facility] [place] and informed them of this expected d/c date. Will continue to assist as needed.   Lida Berkery Dialysis Navigator [phone]

## 2023-10-27 NOTE — Progress Notes (Signed)
 Physical Therapy Weekly Progress Note  Patient Details  Name: Nathan Campbell MRN: [id number] Date of Birth: 10-May-1975  Beginning of progress report period: October 19, 2023 End of progress report period: October 27, 2023  Patient has met 5 of 5 short term goals. Pt is making functional progress towards LTG's by meeting/surpassing STG's set forth by evaluating PT. Pt currently ambulated 150'+ without AD and with overall CGA with R gaze preference that requires consistent cuing to correct back to center. Pt also navigates stairs (at least 8 - limited due to fatigue) with CGA/close supervision and R UE supported on railing. Pt performs bed mobility with supervision, and has increased 5xSTS from 53.01s (after evaluation) to 35.05s (limited to perform faster due to fatigue). Family education has been scheduled for next week.   Patient continues to demonstrate the following deficits muscle weakness and muscle joint tightness, decreased cardiorespiratoy endurance, decreased attention, decreased awareness, decreased problem solving, and decreased safety awareness, and decreased standing balance, hemiplegia, and decreased balance strategies and therefore will continue to benefit from skilled PT intervention to increase functional independence with mobility.  Patient progressing toward long term goals..  Continue plan of care.  PT Short Term Goals Week 1:  PT Short Term Goal 1 (Week 1): Pt will demosntrate bed mobility with CGA and good technique. PT Short Term Goal 1 - Progress (Week 1): Met PT Short Term Goal 2 (Week 1): pt will perform standing transfers with consistent SBA/ CGA. PT Short Term Goal 2 - Progress (Week 1): Met PT Short Term Goal 3 (Week 1): Pt will perform ambulation up to 100 ft using LRAD with CGA. PT Short Term Goal 3 - Progress (Week 1): Met PT Short Term Goal 4 (Week 1): Pt will demonstrate stair navigation up /down at least 8 steps with overall MinA. PT Short Term Goal 4 - Progress  (Week 1): Met PT Short Term Goal 5 (Week 1): Pt will complete appropriate outcome measure. PT Short Term Goal 5 - Progress (Week 1): Met    Therapy Documentation Precautions:  Precautions Precautions: Fall Precaution/Restrictions Comments: L hemipareisis UE>LE, L hemianopsia, cortrak, watch SpO2 and RR Restrictions Weight Bearing Restrictions Per Provider Order: No   Sherlean Perks PT Dominic Sandoval PTA  10/27/2023, 1:02 PM

## 2023-10-27 NOTE — Plan of Care (Signed)
  Problem: Consults Goal: RH STROKE PATIENT EDUCATION Description: See Patient Education module for education specifics  Outcome: Progressing   Problem: RH SAFETY Goal: RH STG ADHERE TO SAFETY PRECAUTIONS W/ASSISTANCE/DEVICE Description: STG Adhere to Safety Precautions With cues Assistance/Device. Outcome: Progressing

## 2023-10-27 NOTE — Progress Notes (Signed)
 Patient ID: Nathan Campbell, male   DOB: 1975/08/08, 49 y.o.   MRN: [id number]  SW sent HHPT/OT/SLP/aide referral to [facility] and waiting on follow-up.  *referral accepted.   [name] [name], MSW, LCSW Office: [phone] Cell: [phone] Fax: [phone]

## 2023-10-27 NOTE — Progress Notes (Signed)
 Speech Language Pathology Daily Session Note  Patient Details  Name: Nathan Campbell MRN: [id number] Date of Birth: [birth year]  Today's Date: 10/27/2023 SLP Individual Time: 0700-0758 SLP Individual Time Calculation (min): 58 min  Short Term Goals: Week 2: SLP Short Term Goal 1 (Week 2): Patient will utilize speech intelligibility strategies to reach 75% intelligibility at the phrase level given mod multimodal A SLP Short Term Goal 2 (Week 2): Patient will demonstrate functional problem solving during daily tasks given min multimodal A SLP Short Term Goal 3 (Week 2): Patient will recall and utilize memory compensatory aids given min multimodal A SLP Short Term Goal 4 (Week 2): Patient will demonstrate awareness of cognitive and physical changes since admission given min multimodal A SLP Short Term Goal 5 (Week 2): Patient will tolerate upgraded bolus trials (Dys2 solids and/or thin liquids) with no overt difficulty/ s/sx of penetration/aspiration prior to determine readiness for diet upgrade.  Skilled Therapeutic Interventions: Skilled therapy session focused on dysphagia and communicative goals. SLP targeted dysphagia goals through providing set up A for breakfast tray consisting of D2 solids and NTL. Patient with improved bolus size and slow rate. No buccal pocketing observed and patient with swift lingual propulsion. Patient with two instances of coughing during PO, though per MBS results recommend continuation of current diet. SLP targeted communication goals through completion of lingual strengthening exercises. Patient completed 20 repetitions of anterior lingual strengthening at 20kpa and posterior at 20kpa. Patient approximately 75% intelligible at the phrase level given minA this date. Patient left in [facility] with alarm set and call bell in reach. Continue POC  Pain None reported   Therapy/Group: Individual Therapy  Carman Essick M.A., CCC-SLP 10/27/2023, 6:36 AM

## 2023-10-27 NOTE — Progress Notes (Addendum)
 Physical Therapy Session Note  Patient Details  Name: Nathan Campbell MRN: [id number] Date of Birth: September 25, 1975  Today's Date: 10/27/2023 PT Individual Time: 1052-1202 PT Individual Time Calculation (min): 70 min   Short Term Goals: Week 1:  PT Short Term Goal 1 (Week 1): Pt will demosntrate bed mobility with CGA and good technique. PT Short Term Goal 2 (Week 1): pt will perform standing transfers with consistent SBA/ CGA. PT Short Term Goal 3 (Week 1): Pt will perform ambulation up to 100 ft using LRAD with CGA. PT Short Term Goal 4 (Week 1): Pt will demonstrate stair navigation up /down at least 8 steps with overall MinA. PT Short Term Goal 5 (Week 1): Pt will complete appropriate outcome measure.  Skilled Therapeutic Interventions/Progress Updates: Patient semi-reclined in bed asleep on entrance to room. Patient alert and agreeable to PT session.   Patient reported feeling better following nausea incident earlier in morning (nsg provided appropriate medication - PA arrived during session and was made aware of pt's presentation).   Therapeutic Activity: Bed Mobility: Pt performed supine<sit on EOB with supervision (bed flat and no use of railing). Transfers: Pt performed sit<>stand transfers throughout session with close supervision and no AD. Provided VC for pt to control descent with R UE and quadriceps. Pt mother participated in in room transfer from WC<>EOB with appropriate positioning/safety awareness.  5xSTS (avg = 35.05s) with no AD and R UE pushing/controlling descent. Pt has improved in ability to perform without assistance. Pt educated at end to further control descent with eccentric quad control. Pt fatigued throughout and required rest breaks and was easily distracted in moderately distracting environment.   - 22.94  - 47.15 (pt required rest break due to fatigue)  Neuromuscular Re-ed: NMR facilitated during session with focus on dynamic standing balance, weight shift,  proprioceptive feedback, coordination. - Pt ambulated roughly 200' without AD and with CGA in main gym (and on way back to room at end of session - 150'+ with same level of assistance and improved reciprocal arm swing with less cuing required).  - Pt with improvement in weight shift to R vs last time working with this PTA shortly after evaluation. Pt cued to increase L gaze per R preference (also has improved). - Decreased reciprocal arm swing with VC to increase with pt doing so (mod cues to maintain with pt reporting having to think about it more).  - Pt weaving in snake pattern anteriorly through orange cones with CGA for safety and no lob. Pt progressed to weaving through cones backwards with overall CGA/light minA and VC to increase awareness to R/L to locate cones on floor (pt required wide turns to navigate). - Pt pt laterally stepping down orange cones to perform cone tapes with B LE (alternating). Pt with VC to increase hip flexion when stepping off to avoid knocking over cones, and to increase weight shift to R. Pt with CGA throughout. Pt also cued to keep LE's in neutral positioning to avoid hip flexor compensation. - Pt ambulated 100'+ without AD and with VC to look in various directions (greater gait deviation when cued to look to L - pt also required max cuing to maintain gaze to L vs when looking to R). Pt with CGA throughout.   NMR performed for improvements in motor control and coordination, balance, sequencing, judgement, and self confidence/ efficacy in performing all aspects of mobility at highest level of independence.   Patient sitting in WC at end of session with brakes locked,  family present, mother checked off for in room transfers, and all needs within reach.      Therapy Documentation Precautions:  Precautions Precautions: Fall Precaution/Restrictions Comments: L hemipareisis UE>LE, L hemianopsia, cortrak, watch SpO2 and RR Restrictions Weight Bearing Restrictions Per  Provider Order: No  Therapy/Group: Individual Therapy  Padme Arriaga PTA 10/27/2023, 12:34 PM

## 2023-10-27 NOTE — Progress Notes (Signed)
 Occupational Therapy Weekly Progress Note  Patient Details  Name: Nathan Campbell MRN: [id number] Date of Birth: [birth year]  Beginning of progress report period: October 19, 2023 End of progress report period: November 04, 2023  Today's Date: 10/27/2023 OT Individual Time: 9169-9069 OT Individual Time Calculation (min): 60 min    Patient has met 4 of 4 short term goals.  Pt is making excellent progress with his balance,  L side awareness, LUE AROM and functional use, and use of adaptive aides.  Patient continues to demonstrate the following deficits: unbalanced muscle activation and decreased coordination, decreased visual perceptual skills, decreased visual motor skills, and hemianopsia, decreased attention to left, and decreased standing balance, hemiplegia, and decreased balance strategies and therefore will continue to benefit from skilled OT intervention to enhance overall performance with BADL.  Patient progressing toward long term goals..  Continue plan of care.  OT Short Term Goals Week 1:  OT Short Term Goal 1 (Week 1): Pt will be able to don shirt with min A. OT Short Term Goal 1 - Progress (Week 1): Met OT Short Term Goal 2 (Week 1): Pt will be able to don pants over feet with min A. OT Short Term Goal 2 - Progress (Week 1): Met OT Short Term Goal 3 (Week 1): pt will be able to perform self ROM of LUE. OT Short Term Goal 3 - Progress (Week 1): Met OT Short Term Goal 4 (Week 1): Pt will be able to bathe with min A using long handled sponge. OT Short Term Goal 4 - Progress (Week 1): Met Week 2:  OT Short Term Goal 1 (Week 2): STGs = LTGs  Skilled Therapeutic Interventions/Progress Updates:    Pt received in w/c. NTs shared that pt had just vomited and they had just cleaned him up as much as possible. Pt did want to shower.  Pt used quad cane to ambulate to toilet but needed cues to attend to L side as his shoulder hit the doorframe as he was walking in. Pt sat to toilet but unable  to void.   He stepped into shower not needing the cane.  With use of L hand wash mit velcroed around wrist he was able to wash most of his R arm except under axilla and was able to wash a good majority of his body.  Used long sponge for feet. Stood and used R hand to wash bottom. After pt was dried off he began to vomit again and vomitted large amounts into trashcan. Called for NT to assist.  Pt shook briefly after vomiting for a few seconds and then responded to my calls for him to look at me.  He vomited a second time.  Cleansed him again and had pt do stand pivot to wc.  From wc pt brushed teeth and dressed.  Suggested pt lay down to rest. NT went to change linens and then was planning to help him get into bed as OT session time over.    Therapy Documentation Precautions:  Precautions Precautions: Fall Precaution/Restrictions Comments: L hemipareisis UE>LE, L hemianopsia, cortrak, watch SpO2 and RR Restrictions Weight Bearing Restrictions Per Provider Order: No   Pain:  No c/o pain  ADL: ADL Eating: Supervision/safety Grooming: Supervision/safety Upper Body Bathing: Minimal assistance Where Assessed-Upper Body Bathing: Shower Lower Body Bathing: Minimal assistance Where Assessed-Lower Body Bathing: Shower Upper Body Dressing: Minimal assistance Lower Body Dressing: Moderate assistance Toileting: Moderate assistance Where Assessed-Toileting: Teacher, adult education: Furniture conservator/restorer Method: [employer]  Transfer Equipment: Acupuncturist: Administrator, arts Method: Designer, industrial/product: Shower seat with back   Therapy/Group: Individual Therapy  Tylesha Gibeault 10/27/2023, 12:38 PM

## 2023-10-27 NOTE — Progress Notes (Signed)
 [facility] Progress Note   Subjective:    Seen and examined patient at HD. He is to get 1L UF today. Patient denies dyspnea or CP today. BP acceptable.   Objective Vitals:   10/27/23 1330 10/27/23 1335 10/27/23 1345 10/27/23 1400  BP: (!) 83/54 99/73 110/81 114/68  Pulse: (!) 56 97 83 82  Resp: (!) 29 (!) 30 (!) 22 18  Temp: 97.7 F (36.5 C)     TempSrc:      SpO2: 93% (S) (!) 89% 100% 99%  Weight:      Height:       Physical Exam General: Working with PT, no distress, obese, cortrak in place Heart: Normal rate, no rub Lungs: Diminished at bases, otherwise clear ; breathing unlabored Abdomen: soft, distended Extremities: No LE edema appreciated, warm and well-perfused Dialysis Access: AVF +t/b  Filed Weights   10/25/23 1336 10/25/23 1815 10/26/23 0400  Weight: 131.1 kg 128.1 kg 126.6 kg    Intake/Output Summary (Last 24 hours) at 10/27/2023 1418 Last data filed at 10/27/2023 0739 Gross per 24 hour  Intake 358 ml  Output --  Net 358 ml    Additional Objective Labs: Basic Metabolic Panel: Recent Labs  Lab 10/22/23 0500 10/24/23 0553  NA 133* 133*  K 5.2* 5.9*  CL 90* 88*  CO2 24 28  GLUCOSE 106* 105*  BUN 102* 88*  CREATININE 15.03* 15.04*  CALCIUM  10.1 10.4*  PHOS 9.8* 10.0*   Liver Function Tests: Recent Labs  Lab 10/22/23 0500 10/24/23 0553  ALBUMIN 3.5 3.6   No results for input(s): LIPASE, AMYLASE in the last 168 hours. CBC: Recent Labs  Lab 10/22/23 0500 10/24/23 0553  WBC 9.1 10.9*  NEUTROABS 5.0 6.5  HGB 11.1* 11.3*  HCT 34.0* 34.7*  MCV 98.0 98.9  PLT 353 363   Blood Culture    Component Value Date/Time   SDES TRACHEAL ASPIRATE 10/08/2023 0627   SPECREQUEST NONE 10/08/2023 0627   CULT  10/08/2023 0627    Normal respiratory flora-no Staph aureus or Pseudomonas seen Performed at [facility], [address], [place], [place] [address]    REPTSTATUS 10/10/2023 FINAL 10/08/2023 9372    Cardiac  Enzymes: No results for input(s): CKTOTAL, CKMB, CKMBINDEX, TROPONINI in the last 168 hours. CBG: Recent Labs  Lab 10/22/23 0418 10/22/23 1229 10/22/23 1659 10/22/23 1957 10/25/23 2203  GLUCAP 90 107* 87 130* 143*   Iron  Studies: No results for input(s): IRON , TIBC, TRANSFERRIN, FERRITIN in the last 72 hours. Lab Results  Component Value Date   INR 1.0 10/07/2023   Studies/Results: No results found.  Medications:   aspirin   81 mg Oral Daily   atorvastatin   40 mg Oral Daily   carvedilol   12.5 mg Oral BID   Chlorhexidine  Gluconate Cloth  6 each Topical Q0600   clopidogrel   75 mg Oral Daily   docusate  100 mg Oral BID   lanthanum   1,000 mg Oral TID WC   mouth rinse  15 mL Mouth Rinse 4 times per day   pantoprazole   40 mg Oral QHS   sodium zirconium cyclosilicate   10 g Oral BID    Dialysis Orders: TTS - AF 4:15hr, 500/600, EDW 138kg, 2K/2C bath, AVF, heparin  4000+ - Mircera 50mcg given 10/06/23 - Hectoral 7mcg IV q HD - Takes Auryxia  3/meals and Xphozah 30mg  BID  Assessment/Plan: Acute R MCA CVA: S/p TNK 7/25. Required intubation, now on [place] O2. Ongoing L sided weakness. Per neuro.  Working with PT currently. Now in CIR. AHRF, ?aspiration + CVA: Now extubated, on [place] O2. S/p Unasyn  course. WBC normalized.  ESRD: Continue HD on TTS schedule. Next HD 8/12. Hyperkalemia: S/p Lokelma  X 3 doses. K+ now is 5.2. Checking labs in AM. Monitor trend, may need to consider Lokelma  on non-HD days if no improvement. K 5.9 today. 1K bath ordered and will continue to monitor if he needs additional lokelma .  HTN/volume: BP decent, occ high - UF 1L today. I feel that he might be getting a bit dry.  Anemia of ESRD: Hgb 11.7, no ESA for now. Secondary HPTH: Corrected Ca 10.6 and Phos 11.4, VDRA on hold. Home auryxia  can not be given via NG tube - change to fosrenol  for now. Nutrition: Alb 3.4, getting TF + protein supplements. Per RD - unable to do Nepro at this point,  will change in future.  Belvie Och, NP [facility] 10/27/2023,2:18 PM  LOS: 9 days

## 2023-10-27 NOTE — Progress Notes (Signed)
   10/27/23 1745  Vitals  Temp 98.2 F (36.8 C)  Pulse Rate 90  Resp 18  BP 115/67  SpO2 95 %  Weight 125.5 kg  Post Treatment  Dialyzer Clearance Lightly streaked  Hemodialysis Intake (mL) 0 mL  Liters Processed 82.6  Fluid Removed (mL) 1000 mL  Tolerated HD Treatment Yes  AVG/AVF Arterial Site Held (minutes) 8 minutes  AVG/AVF Venous Site Held (minutes) 8 minutes   Received patient in bed to unit.  Alert and oriented.  Informed consent signed and in chart.   TX duration:3.5HRS  Patient tolerated well.  Transported back to the room  Alert, without acute distress.  Hand-off given to patient's nurse.   Access used: LAVF Access issues: NONE  Total UF removed: 1L Medication(s) given: NONE    Na'Shaminy T Doss [facility]

## 2023-10-27 NOTE — Progress Notes (Signed)
 PROGRESS NOTE   Subjective/Complaints:  Pt up at EOB. No issues this morning. Says he needs to have a BM. Denied pain  ROS: Patient denies fever, rash, sore throat, blurred vision, dizziness, nausea, vomiting, diarrhea, cough, shortness of breath or chest pain, joint or back/neck pain, headache, or mood change.   Objective:   No results found.  No results for input(s): WBC, HGB, HCT, PLT in the last 72 hours.  No results for input(s): NA, K, CL, CO2, GLUCOSE, BUN, CREATININE, CALCIUM  in the last 72 hours.   Intake/Output Summary (Last 24 hours) at 10/27/2023 0954 Last data filed at 10/27/2023 0739 Gross per 24 hour  Intake 535 ml  Output --  Net 535 ml        Physical Exam: Vital Signs Blood pressure 108/62, pulse 91, temperature 97.7 F (36.5 C), temperature source Oral, resp. rate 18, height 6' (1.829 m), weight 126.6 kg, SpO2 93%.   Constitutional: No distress . Vital signs reviewed. HEENT: NCAT, EOMI, oral membranes moist Neck: supple Cardiovascular: RRR without murmur. No JVD    Respiratory/Chest: CTA Bilaterally without wheezes or rales. Normal effort    GI/Abdomen: BS +, non-tender, non-distended Ext: no clubbing, cyanosis, or edema Psych: pleasant and cooperative  Skin: No evidence of breakdown, no evidence of rash Neurologic:alert, cranial nerves II through XII grossly intact, follows simple commands. Speech a little dysarthric.  motor strength is 5/5 in right deltoid, bicep, tricep, grip, hip flexor, knee extensors, ankle dorsiflexor and plantar flexor 2+ to 3-/5 in LUE at delt and biceps and grip, 1+ to 2-/5 L finger and arm extension , 4- LLE muscle groups as above  Sensory exam normal sensation to light touch  in bilateral lower extremities Absent LT LUE Cerebellar exam not tested on LUE due to weakness Musculoskeletal: pain free  range of motion in all 4 extremities. No joint  swelling Prior neuro assessment is c/w today's exam 10/27/2023.   Assessment/Plan: 1. Functional deficits which require 3+ hours per day of interdisciplinary therapy in a comprehensive inpatient rehab setting. Physiatrist is providing close team supervision and 24 hour management of active medical problems listed below. Physiatrist and rehab team continue to assess barriers to discharge/monitor patient progress toward functional and medical goals  Care Tool:  Bathing    Body parts bathed by patient: Chest, Abdomen, Front perineal area, Right upper leg, Left upper leg, Face   Body parts bathed by helper: Right arm, Left arm, Buttocks, Right lower leg, Left lower leg     Bathing assist Assist Level: Maximal Assistance - Patient 24 - 49%     Upper Body Dressing/Undressing Upper body dressing   What is the patient wearing?: Pull over shirt    Upper body assist Assist Level: Maximal Assistance - Patient 25 - 49%    Lower Body Dressing/Undressing Lower body dressing      What is the patient wearing?: Pants, Incontinence brief     Lower body assist Assist for lower body dressing: Maximal Assistance - Patient 25 - 49%     Toileting Toileting Toileting Activity did not occur (Clothing management and hygiene only): N/A (no void or bm) (dialysis patient)  Toileting assist  Assist for toileting: Maximal Assistance - Patient 25 - 49%     Transfers Chair/bed transfer  Transfers assist     Chair/bed transfer assist level: Contact Guard/Touching assist     Locomotion Ambulation   Ambulation assist      Assist level: Contact Guard/Touching assist Assistive device: Cane-quad Max distance: 35'   Walk 10 feet activity   Assist     Assist level: Contact Guard/Touching assist Assistive device: Cane-quad   Walk 50 feet activity   Assist Walk 50 feet with 2 turns activity did not occur: Safety/medical concerns         Walk 150 feet activity   Assist Walk 150  feet activity did not occur: Safety/medical concerns         Walk 10 feet on uneven surface  activity   Assist Walk 10 feet on uneven surfaces activity did not occur: Safety/medical concerns         Wheelchair     Assist Is the patient using a wheelchair?: Yes Type of Wheelchair: Manual    Wheelchair assist level: Total Assistance - Patient < 25% Max wheelchair distance: 220'    Wheelchair 50 feet with 2 turns activity    Assist        Assist Level: Dependent - Patient 0%   Wheelchair 150 feet activity     Assist      Assist Level: Dependent - Patient 0%   Blood pressure 108/62, pulse 91, temperature 97.7 F (36.5 C), temperature source Oral, resp. rate 18, height 6' (1.829 m), weight 126.6 kg, SpO2 93%.  Medical Problem List and Plan: 1. Functional deficits secondary to acute R MCA CVA with right M1 occlusion s/p TNK 7/25 and IR with TICI2b likely large vessel disease.             -patient may shower             -ELOS/Goals: 11/04/23   supervision            -Continue CIR therapies including PT, OT     2.  Antithrombotics: -DVT/anticoagulation:  Pharmaceutical: Heparin  will d/c since ambulation is >100' with PT             -antiplatelet therapy: Aspirin  and Plavix  x 3 months then Aspirin  alone.  3. Pain Management: Tylenol  and Flexeril  (back pain) as needed 4. Mood/Behavior/Sleep: Therapy evaluations completed due to patient decreased functional mobility was admitted for a comprehensive rehab program.              -antipsychotic agents: N/A 5. Neuropsych/cognition: This patient is not capable of making decisions on his own behalf. 6. Skin/Wound Care: Routine pressure relief measures Pruritus without rash , likely due to renal disease , cont Sarna lotion and prn benadryl --improved 7. Fluids/Electrolytes/Nutrition: Monitor I/O and Daily weights.              - TF + protein supplements.  Dysphagia - upgraded to dysphagia 1 diet -TF d/ced Taking po  very well , 100% meals although fluid intake recorded is still low  8. ESRD on hemodialysis: BUN 139, Cr 18.98 HD on TTHS schedule- --Labs to be collected in HD             -Hyperphos/Hyperkalemia - per Nephro 9. Chronic Anemia of ESRD: hgb 10.9 from 11.6 continue to monitor  10. Acute hypoxic/hypercapnic respiratory failure: On 2 L [place] CXR Vague lingular and left lower lobe airspace opacification   Repeat CXR done 8/5 pending.  Suspect  underlying OSA will check overnite oximetry  11. Leukocytosis:     Latest Ref Rng & Units 10/24/2023    5:53 AM 10/22/2023    5:00 AM 10/20/2023    6:16 AM  CBC  WBC 4.0 - 10.5 K/uL 10.9  9.1  9.8   Hemoglobin 13.0 - 17.0 g/dL 88.6  88.8  89.3   Hematocrit 39.0 - 52.0 % 34.7  34.0  33.3   Platelets 150 - 400 K/uL 363  353  381    8/14 remains afebrile 12. Combined systolic and diastolic H: home meds amlodipine , isosorbide  and carvedilol .  Fluid management per nephrology.             - Monitor for orthostasis with activity.  13. Secondary HPTH: Home auryxia  cannot be given via NG tube--continue Fosrenol   14. HLD: LDL 85 Atorvastatin  40 mg  15. Obesity: BMI 39.77 educate on diet and weight loss to promote overall health and mobility.  16.  Hypertension: Long-term goal normotensive.  Continue Norvasc , Coreg , isosorbide   Vitals:   10/26/23 2021 10/27/23 0337  BP: 103/67 108/62  Pulse: 98 91  Resp:  18  Temp:  97.7 F (36.5 C)  SpO2:  93%   Increase coreg  to 12.5 still hypertensive with elevated HR -8/13 d/c Norvasc  d/t orthostatic hypotension Also hold isordil  , cont coreg  due to tachy 8/14 bp's still soft. Denies dizziness. Await orthostatic VS 17.  Hyperlipidemia: Continue statin    18.  Vasovagal episode due to HD (2L off yesterday ) as well as reduce fluid intake po and minmal TF volumes, no off TF, will increase free H20 today and enc po fluid intake to >1052ml , Nephro managing HD   8/14-volume mgt per ortho, ensure adequate po fluids    -feeling  better today 8/14 but due for HD today--obsv, renal aware     LOS: 9 days A FACE TO FACE EVALUATION WAS PERFORMED  Nathan Campbell 10/27/2023, 9:54 AM

## 2023-10-28 MED ORDER — SODIUM ZIRCONIUM CYCLOSILICATE 10 G PO PACK
10.0000 g | PACK | Freq: Two times a day (BID) | ORAL | Status: AC
  Administered 2023-10-28 (×2): 10 g via ORAL
  Filled 2023-10-28 (×2): qty 1

## 2023-10-28 MED ORDER — CHLORHEXIDINE GLUCONATE CLOTH 2 % EX PADS: 6.0000 | MEDICATED_PAD | Freq: Every day | CUTANEOUS | Status: DC

## 2023-10-28 MED ORDER — HYDROXYZINE HCL 25 MG PO TABS: 25.0000 mg | ORAL_TABLET | Freq: Three times a day (TID) | ORAL | Status: DC | PRN

## 2023-10-28 MED ORDER — MIDODRINE HCL 5 MG PO TABS
5.0000 mg | ORAL_TABLET | Freq: Two times a day (BID) | ORAL | Status: DC
  Administered 2023-10-28 – 2023-11-04 (×14): 5 mg via ORAL
  Filled 2023-10-28 (×14): qty 1

## 2023-10-28 NOTE — Progress Notes (Signed)
 [facility] Progress Note   Subjective:    Seen and examined patient in his room. He was in and out of sleep when I saw him. He had HD yesterday and the only removed 1L due to him possibly being a bit dry. BP stable today. He is hyperkalemic today. 2 doses of Lokelma  ordered.   Objective Vitals:   10/27/23 1740 10/27/23 1745 10/27/23 2132 10/28/23 0426  BP: 109/71 115/67 111/64 126/75  Pulse: 86 90 92 100  Resp: (!) 23 18 18 18   Temp:  98.2 F (36.8 C) 98.2 F (36.8 C) 98.1 F (36.7 C)  TempSrc:      SpO2: 99% 95% 90% 91%  Weight:  125.5 kg  128.3 kg  Height:       Physical Exam General: Working with PT, no distress, obese, cortrak in place Heart: Normal rate, no rub Lungs: Diminished at bases, otherwise clear ; breathing unlabored Abdomen: soft, distended Extremities: No LE edema appreciated, warm and well-perfused Dialysis Access: AVF +t/b  Filed Weights   10/27/23 0702 10/27/23 1745 10/28/23 0426  Weight: 126.6 kg 125.5 kg 128.3 kg    Intake/Output Summary (Last 24 hours) at 10/28/2023 1218 Last data filed at 10/28/2023 0856 Gross per 24 hour  Intake 240 ml  Output 1000 ml  Net -760 ml    Additional Objective Labs: Basic Metabolic Panel: Recent Labs  Lab 10/22/23 0500 10/24/23 0553  NA 133* 133*  K 5.2* 5.9*  CL 90* 88*  CO2 24 28  GLUCOSE 106* 105*  BUN 102* 88*  CREATININE 15.03* 15.04*  CALCIUM  10.1 10.4*  PHOS 9.8* 10.0*   Liver Function Tests: Recent Labs  Lab 10/22/23 0500 10/24/23 0553  ALBUMIN 3.5 3.6   No results for input(s): LIPASE, AMYLASE in the last 168 hours. CBC: Recent Labs  Lab 10/22/23 0500 10/24/23 0553  WBC 9.1 10.9*  NEUTROABS 5.0 6.5  HGB 11.1* 11.3*  HCT 34.0* 34.7*  MCV 98.0 98.9  PLT 353 363   Blood Culture    Component Value Date/Time   SDES TRACHEAL ASPIRATE 10/08/2023 0627   SPECREQUEST NONE 10/08/2023 0627   CULT  10/08/2023 0627    Normal respiratory flora-no Staph aureus or  Pseudomonas seen Performed at [facility], [address] [place], [place] [address]    REPTSTATUS 10/10/2023 FINAL 10/08/2023 9372    Medications:   aspirin   81 mg Oral Daily   atorvastatin   40 mg Oral Daily   carvedilol   12.5 mg Oral BID   Chlorhexidine  Gluconate Cloth  6 each Topical Q0600   clopidogrel   75 mg Oral Daily   docusate  100 mg Oral BID   lanthanum   1,000 mg Oral TID WC   midodrine   5 mg Oral BID WC   mouth rinse  15 mL Mouth Rinse 4 times per day   pantoprazole   40 mg Oral QHS   sodium zirconium cyclosilicate   10 g Oral BID    Dialysis Orders: TTS - AF 4:15hr, 500/600, EDW 138kg, 2K/2C bath, AVF, heparin  4000+ - Mircera 50mcg given 10/06/23 - Hectoral 7mcg IV q HD - Takes Auryxia  3/meals and Xphozah 30mg  BID  Assessment/Plan: Acute R MCA CVA: S/p TNK 7/25. Required intubation, now on [place] O2. Ongoing L sided weakness. Per neuro. Working with PT currently. Now in CIR. AHRF, ?aspiration + CVA: Now extubated, on [place] O2. S/p Unasyn  course. WBC normalized.  ESRD: Continue HD on TTS schedule. Next HD 8/12. Hyperkalemia: S/p Lokelma   X 3 doses. K+ now is 5.9. 2 doses of lokelma  ordered today to get him to HD. Checking labs in AM..  HTN/volume: BP decent, occ high - UF 1L today. I feel that he might be getting a bit dry.  Anemia of ESRD: Hgb 11.3, no ESA for now. Secondary HPTH: Corrected Ca 10.6 and Phos 11.4, VDRA on hold. Home auryxia  can not be given via NG tube - change to fosrenol  for now. Nutrition: Alb 3.4, getting TF + protein supplements. Per RD - unable to do Nepro at this point, will change in future.  Belvie Och, NP [facility] 10/28/2023,12:18 PM  LOS: 10 days

## 2023-10-28 NOTE — Progress Notes (Signed)
 Physical Therapy Session Note  Patient Details  Name: Johncarlo Maalouf MRN: [id number] Date of Birth: [birth year]  Today's Date: 10/28/2023 PT Individual Time: 0905-1000 PT Individual Time Calculation (min): 55 min   Short Term Goals: Week 2:  PT Short Term Goal 1 (Week 2): STG = LTG due to ELOS  Skilled Therapeutic Interventions/Progress Updates: Patient sitting in WC on entrance to room. Patient alert and agreeable to PT session.   Patient reported no pain, only global itchiness (vaseline applied to entire torso and B UE's at end of session). Pt transported from room<main gym in [facility] dependently due to potentiality of pt becoming nauseas and needing to sit (denied symptoms at beginning of session). Pt cued to stand to ambulate without AD. Pt reported lightheadedness and needed to sit back to [facility] (stood with supervision). BP assessed (see vitals). Pt reported slow decrease in symptoms after sitting for few minutes (pt transported back to room in [facility]). Pt performed sit<stand transfer from WC<EOB supervision, and supervision back into bed for safety. PTA obtained vitals again (B LE's elevated). Pt denied symptoms. NSG arrived with pt sitting EOB and pt reporting sharp HA on R temple with lightheadedness (decreased shortly after sitting). PTA alerted covering physician about pt's orthostatics and increasing reports of global itchiness. Physician stated to don B TED hose to LE's. PTA donned with pt performing supine<>sit to obtain BP with TEDs donned and pt's BP still low but pt denying any symptoms. Pt reported increased fatigue.   Patient supine asleep in bed at end of session with brakes locked, bed alarm set, and all needs within reach.      Therapy Documentation Precautions:  Precautions Precautions: Fall Precaution/Restrictions Comments: L hemipareisis UE>LE, L hemianopsia, cortrak, watch SpO2 and RR Restrictions Weight Bearing Restrictions Per Provider Order: No  Vitals  67/47 (53); 114  HR - sitting in WC following first report of lightheadedness symptoms 77/54 (62); 124 HR - sitting in WC shortly after first reading 102/65 (77); 97 HR - supine in bed with B LE's elevated 89/65 (73); 114 HR - sitting EOB (no TED's donned) 111/83 (94) - after few minutes sitting EOB  88/58 (68); 92 HR - supine in bed after TED hose donned 86/67 (71); 109 HR - sitting EOB with TED hose donned  Therapy/Group: Individual Therapy  Mikai Meints PTA 10/28/2023, 12:49 PM

## 2023-10-28 NOTE — Progress Notes (Signed)
 PROGRESS NOTE   Subjective/Complaints:  Pt was feeling ok this morning when I saw him. The rest of his day was ok yesterday. This morning with PT he felt extremely dizzy and nauseas. BP was recorded in 70's/40's.  BP recovered when he sat down. Therapist said that patient was sitting up for at least several minutes before they started therapy. Also reported generalized itching.   ROS: Patient denies fever, rash, sore throat,  vomiting, diarrhea, cough, shortness of breath or chest pain, joint or back/neck pain, headache, or mood change.   Objective:   No results found.  No results for input(s): WBC, HGB, HCT, PLT in the last 72 hours.  No results for input(s): NA, K, CL, CO2, GLUCOSE, BUN, CREATININE, CALCIUM  in the last 72 hours.   Intake/Output Summary (Last 24 hours) at 10/28/2023 0956 Last data filed at 10/28/2023 0856 Gross per 24 hour  Intake 240 ml  Output 1000 ml  Net -760 ml        Physical Exam: Vital Signs Blood pressure 126/75, pulse 100, temperature 98.1 F (36.7 C), resp. rate 18, height 6' (1.829 m), weight 128.3 kg, SpO2 91%.   Constitutional: No distress . Vital signs reviewed. obese HEENT: NCAT, EOMI, oral membranes moist Neck: supple Cardiovascular: tachy without murmur. No JVD    Respiratory/Chest: CTA Bilaterally without wheezes or rales. Normal effort    GI/Abdomen: BS +, non-tender, non-distended Ext: no clubbing, cyanosis, or edema Psych: pleasant and cooperative  Skin: No evidence of breakdown, no evidence of rash Neurologic:alert, cranial nerves II through XII grossly intact, follows simple commands. Speech a little dysarthric.  motor strength is 5/5 in right deltoid, bicep, tricep, grip, hip flexor, knee extensors, ankle dorsiflexor and plantar flexor 2+ to 3-/5 in LUE at delt and biceps and grip, 1+ to 2-/5 L finger and arm extension , 4- LLE muscle groups as above   Sensory exam normal sensation to light touch  in bilateral lower extremities Absent LT LUE remains Cerebellar exam not tested on LUE due to weakness Musculoskeletal: pain free  range of motion in all 4 extremities. No joint swelling Prior neuro assessment is c/w today's exam 10/28/2023.   Assessment/Plan: 1. Functional deficits which require 3+ hours per day of interdisciplinary therapy in a comprehensive inpatient rehab setting. Physiatrist is providing close team supervision and 24 hour management of active medical problems listed below. Physiatrist and rehab team continue to assess barriers to discharge/monitor patient progress toward functional and medical goals  Care Tool:  Bathing    Body parts bathed by patient: Chest, Abdomen, Front perineal area, Right upper leg, Left upper leg, Face, Left arm, Buttocks, Right lower leg, Left lower leg   Body parts bathed by helper: Right arm     Bathing assist Assist Level: Minimal Assistance - Patient > 75%     Upper Body Dressing/Undressing Upper body dressing   What is the patient wearing?: Pull over shirt    Upper body assist Assist Level: Minimal Assistance - Patient > 75%    Lower Body Dressing/Undressing Lower body dressing      What is the patient wearing?: Pants, Incontinence brief     Lower body  assist Assist for lower body dressing: Moderate Assistance - Patient 50 - 74%     Toileting Toileting Toileting Activity did not occur (Clothing management and hygiene only): N/A (no void or bm) (dialysis patient)  Toileting assist Assist for toileting: Moderate Assistance - Patient 50 - 74%     Transfers Chair/bed transfer  Transfers assist     Chair/bed transfer assist level: Supervision/Verbal cueing     Locomotion Ambulation   Ambulation assist      Assist level: Contact Guard/Touching assist Assistive device: No Device Max distance: 150'   Walk 10 feet activity   Assist     Assist level:  Contact Guard/Touching assist Assistive device: No Device   Walk 50 feet activity   Assist Walk 50 feet with 2 turns activity did not occur: Safety/medical concerns  Assist level: Contact Guard/Touching assist Assistive device: No Device    Walk 150 feet activity   Assist Walk 150 feet activity did not occur: Safety/medical concerns  Assist level: Contact Guard/Touching assist Assistive device: No Device    Walk 10 feet on uneven surface  activity   Assist Walk 10 feet on uneven surfaces activity did not occur: Safety/medical concerns         Wheelchair     Assist Is the patient using a wheelchair?: Yes Type of Wheelchair: Manual    Wheelchair assist level: Total Assistance - Patient < 25% Max wheelchair distance: 220'    Wheelchair 50 feet with 2 turns activity    Assist        Assist Level: Dependent - Patient 0%   Wheelchair 150 feet activity     Assist      Assist Level: Dependent - Patient 0%   Blood pressure 126/75, pulse 100, temperature 98.1 F (36.7 C), resp. rate 18, height 6' (1.829 m), weight 128.3 kg, SpO2 91%.  Medical Problem List and Plan: 1. Functional deficits secondary to acute R MCA CVA with right M1 occlusion s/p TNK 7/25 and IR with TICI2b likely large vessel disease.             -patient may shower             -ELOS/Goals: 11/04/23   supervision            -Continue CIR therapies including PT, OT . Continue activities as tolerated today. See # 18 below    2.  Antithrombotics: -DVT/anticoagulation:  Pharmaceutical: Heparin  will d/c since ambulation is >78' with PT             -antiplatelet therapy: Aspirin  and Plavix  x 3 months then Aspirin  alone.  3. Pain Management: Tylenol  and Flexeril  (back pain) as needed 4. Mood/Behavior/Sleep: Therapy evaluations completed due to patient decreased functional mobility was admitted for a comprehensive rehab program.              -antipsychotic agents: N/A 5.  Neuropsych/cognition: This patient is not capable of making decisions on his own behalf. 6. Skin/Wound Care: Routine pressure relief measures Pruritus without rash , likely due to renal disease 8/14- cont Sarna lotion and prn benadryl --encouraged him to use benadryl  for sx 7. Fluids/Electrolytes/Nutrition: Monitor I/O and Daily weights.              - TF + protein supplements.  Dysphagia - upgraded to dysphagia 1 diet -TF d/ced Taking po very well , 100% meals although fluid intake recorded is still low  8. ESRD on hemodialysis: BUN 139, Cr 18.98 HD on TTHS schedule- --  Labs to be collected in HD             -Hyperphos/Hyperkalemia - per Nephro 9. Chronic Anemia of ESRD: hgb 10.9 from 11.6 continue to monitor  10. Acute hypoxic/hypercapnic respiratory failure: On 2 L [place] CXR Vague lingular and left lower lobe airspace opacification    Suspect underlying OSA   11. Leukocytosis:     Latest Ref Rng & Units 10/24/2023    5:53 AM 10/22/2023    5:00 AM 10/20/2023    6:16 AM  CBC  WBC 4.0 - 10.5 K/uL 10.9  9.1  9.8   Hemoglobin 13.0 - 17.0 g/dL 88.6  88.8  89.3   Hematocrit 39.0 - 52.0 % 34.7  34.0  33.3   Platelets 150 - 400 K/uL 363  353  381    8/14 remains afebrile 12. Combined systolic and diastolic H: home meds amlodipine , isosorbide  and carvedilol .  Fluid management per nephrology.             - Monitor for orthostasis with activity.  13. Secondary HPTH: Home auryxia  cannot be given via NG tube--continue Fosrenol   14. HLD: LDL 85 Atorvastatin  40 mg  15. Obesity: BMI 39.77 educate on diet and weight loss to promote overall health and mobility.  16.  Hypertension/hypotension: Long-term goal normotensive.    Vitals:   10/27/23 2132 10/28/23 0426  BP: 111/64 126/75  Pulse: 92 100  Resp: 18 18  Temp: 98.2 F (36.8 C) 98.1 F (36.7 C)  SpO2: 90% 91%   Increase coreg  to 12.5 still hypertensive with elevated HR -8/13 d/c Norvasc  d/t orthostatic hypotension Also hold isordil  , cont coreg  due  to tachy 8/15 pt remains orthostatic, hypotensive. Worse today since post-HD. Nephrology aware  -continue to push fluids  -begin trial of midodrine  5mg  with breakfast, lunch  -TH TEDS  -acclimate prior to activity  -volume adjustments in HD? 17.  Hyperlipidemia: Continue statin          LOS: 10 days A FACE TO FACE EVALUATION WAS PERFORMED  Arthea ONEIDA Gunther 10/28/2023, 9:56 AM

## 2023-10-28 NOTE — Progress Notes (Signed)
 Occupational Therapy Session Note  Patient Details  Name: Nathan Campbell MRN: [id number] Date of Birth: 1975/09/23  Today's Date: 10/28/2023 OT Individual Time: 9161-9094 session 1  OT Individual Time Calculation (min): 27 min  Session 2: 1115-1200 Session 3: 1347-1500   Short Term Goals: Week 2:  OT Short Term Goal 1 (Week 2): STGs = LTGs  Skilled Therapeutic Interventions/Progress Updates:  Session 1: Pt greeted seated in w/c, pt agreeable to OT intervention.    No pain reported during session.   NMR:  Worked on functional grasp/release with  pt instructed to remove horseshoes hanging on Rw with LUE. Pt required step by step cues to sequence motor planning. Pt completed task with overall intermittent MINA to maintain grasp on horseshoes. Good activation noted in biceps but decreased ability to maintain grasp and work through functional motor movement.                 Ended session with pt seated in w/c with all needs within reach and safety belt alarm activated.                     Session 2: Pt greeted asleep in supine, pt required max cues to arouse and participate in session pt then agreeable to OT intervention.    No pain reported during session.   Per PTA pt orthostatic during session. Assessed vitals as indicated below: Supine- 137/112(112) HR 102 Sitting- 124/76( 91) HR 104 Standing- 115/76( 82) HR 110   Transfers/bed mobility/functional mobility: pt completed supine>sit with supervision. Pt completed stand pivot transfers to/from w/c with CGA with no AD.   NMR: pt completed various LUE motor planning tasks at [facility] with pts LUE secured to drum stick with pt instructed to reach to screen against gravity to tap stimulus on screen with a focus on improved active ROM in affected UE. Pt required MIN support at L elbow to elevate UE against gravity.   Graded task up and instructed pt to trace shapes on screen with a focus on improved isometric strength against gravity to  simulate ADL tasks.   Pt very drowsy during session as pt had benadryl  prior to session d/t itchiness needing max cues to sustain attention.                Ended session with pt supine in bed with all needs within reach and bed alarm activated.                    Session 3: Pt greeted supine in bed, pt agreeable to OT intervention.    No pain reported during session.   Transfers/bed mobility/functional mobility: pt completed supine>sit with supervision.  BP from sitting with teds- 99/68( 78) HR 98   D/t soft BP deferred shower to sink level bathing for increased safety.    ADLs:  Grooming: pt completed seated oral care at sink with set- up assist. Educated pt on hemi strategy for applying paste however no carryover noted when given the chance to return demonstrate.  UB dressing:pt donned OH shirt with MODA, no recall of hemistrategy technique for donning shirt.  LB dressing: donned shorts with MODA to thread LLE and pull pants up to waist line on L side.    Bathing: pt completed bathing at sink level with a focus forced use of LUE into functional tasks and compensatory methods for manipulating bathing items. Pt completed bathing with overall MODA needing assist to wash back and RUE. Pt able  to stand to wash buttock and anterior periarea.    NMR:  Pt completed various seated NMRE tasks to facilitate improved LUE attention and motor planning with a focus on bimanual integration to increase volitional movements. Pt able to complete shoulder flexion to 90* with BUEs positioned on unweighted dowel and mirror provided for visual feedback and improve motor return. Pt additionally completed similar exercises with unweighted dowel to facilitate improved scapular mobility and elbow flexion/extension.   Pt given beach ball in bilateral hands with pt instructed to provide equal pressure to ball to elevate ball to shoulder height to promote concentric strength and improve ROM against gravity. Pt able  to elevate ball with only intermittent CGA to maintain grasp on ball in L hand.   Pt completed exercises with 1 lb weighted ball with pt completing rainbows with ball with pt passing ball from one knee to the other with a focus on elbow flexion/extension and bilateral integration. Pt needed MIN A to maintain grasp on ball on L side.   Attempted to work on cylindrical grasp with peg board however pt noted to nod off to sleep but when awakened pt reports  I'm fine.                 Ended session with pt supine in bed with all needs within reach and bed alarm activated.                    Therapy Documentation Precautions:  Precautions Precautions: Fall Precaution/Restrictions Comments: L hemipareisis UE>LE, L hemianopsia, cortrak, watch SpO2 and RR Restrictions Weight Bearing Restrictions Per Provider Order: No    Therapy/Group: Individual Therapy  Ronal Gift [facility] 10/28/2023, 12:06 PM

## 2023-10-28 NOTE — Plan of Care (Signed)
  Problem: Consults Goal: RH STROKE PATIENT EDUCATION Description: See Patient Education module for education specifics  Outcome: Progressing   Problem: RH SAFETY Goal: RH STG ADHERE TO SAFETY PRECAUTIONS W/ASSISTANCE/DEVICE Description: STG Adhere to Safety Precautions With cues Assistance/Device. Outcome: Progressing

## 2023-10-29 LAB — RENAL FUNCTION PANEL
Albumin: 3.5 g/dL (ref 3.5–5.0)
Albumin: 3.6 g/dL (ref 3.5–5.0)
Anion gap: 19 — ABNORMAL HIGH (ref 5–15)
Anion gap: 15 (ref 5–15)
BUN: 77 mg/dL — ABNORMAL HIGH (ref 6–20)
BUN: 33 mg/dL — ABNORMAL HIGH (ref 6–20)
CO2: 24 mmol/L (ref 22–32)
CO2: 28 mmol/L (ref 22–32)
Calcium: 9.9 mg/dL (ref 8.9–10.3)
Calcium: 9.5 mg/dL (ref 8.9–10.3)
Chloride: 91 mmol/L — ABNORMAL LOW (ref 98–111)
Chloride: 92 mmol/L — ABNORMAL LOW (ref 98–111)
Creatinine, Ser: 16.24 mg/dL — ABNORMAL HIGH (ref 0.61–1.24)
Creatinine, Ser: 8.47 mg/dL — ABNORMAL HIGH (ref 0.61–1.24)
GFR, Estimated: 3 mL/min — ABNORMAL LOW (ref >60)
GFR, Estimated: 7 mL/min — ABNORMAL LOW (ref >60)
Glucose, Bld: 113 mg/dL — ABNORMAL HIGH (ref 70–99)
Glucose, Bld: 92 mg/dL (ref 70–99)
Phosphorus: 8.2 mg/dL — ABNORMAL HIGH (ref 2.5–4.6)
Phosphorus: 4.1 mg/dL (ref 2.5–4.6)
Potassium: 5 mmol/L (ref 3.5–5.1)
Potassium: 3.9 mmol/L (ref 3.5–5.1)
Sodium: 134 mmol/L — ABNORMAL LOW (ref 135–145)
Sodium: 135 mmol/L (ref 135–145)

## 2023-10-29 LAB — CBC
HCT: 34.4 % — ABNORMAL LOW (ref 39.0–52.0)
Hemoglobin: 11.2 g/dL — ABNORMAL LOW (ref 13.0–17.0)
MCH: 32.9 pg (ref 26.0–34.0)
MCHC: 32.6 g/dL (ref 30.0–36.0)
MCV: 101.2 fL — ABNORMAL HIGH (ref 80.0–100.0)
Platelets: 307 K/uL (ref 150–400)
RBC: 3.4 MIL/uL — ABNORMAL LOW (ref 4.22–5.81)
RDW: 13.9 % (ref 11.5–15.5)
WBC: 8.9 K/uL (ref 4.0–10.5)
nRBC: 0 % (ref 0.0–0.2)

## 2023-10-29 MED ORDER — CARVEDILOL 6.25 MG PO TABS
6.2500 mg | ORAL_TABLET | Freq: Two times a day (BID) | ORAL | Status: DC
  Administered 2023-10-29 – 2023-11-04 (×12): 6.25 mg via ORAL
  Filled 2023-10-29 (×12): qty 1

## 2023-10-29 MED ORDER — HEPARIN SODIUM (PORCINE) 1000 UNIT/ML DIALYSIS
4000.0000 [IU] | INTRAMUSCULAR | Status: DC | PRN
  Administered 2023-10-29: 4000 [IU] via INTRAVENOUS_CENTRAL

## 2023-10-29 MED ORDER — SODIUM ZIRCONIUM CYCLOSILICATE 10 G PO PACK
10.0000 g | PACK | Freq: Two times a day (BID) | ORAL | Status: DC
  Filled 2023-10-29: qty 1

## 2023-10-29 MED ORDER — HEPARIN SODIUM (PORCINE) 1000 UNIT/ML IJ SOLN
INTRAMUSCULAR | Status: AC
  Filled 2023-10-29: qty 4

## 2023-10-29 MED ORDER — HEPARIN SODIUM (PORCINE) 1000 UNIT/ML DIALYSIS: 2000.0000 [IU] | INTRAMUSCULAR | Status: DC | PRN

## 2023-10-29 MED ORDER — LANTHANUM CARBONATE 500 MG PO CHEW
1500.0000 mg | CHEWABLE_TABLET | Freq: Three times a day (TID) | ORAL | Status: DC
  Administered 2023-10-29 – 2023-11-04 (×17): 1500 mg via ORAL
  Filled 2023-10-29 (×18): qty 3

## 2023-10-29 NOTE — Progress Notes (Signed)
 [facility] Progress Note   Subjective:   Patient seen and examined in room.  Sitting in wheelchair.  In between therapy.  Denies CP, SOB, abdominal pain and n/v/d.  Reports BP dropping with HD.    Objective Vitals:   10/28/23 0426 10/28/23 1703 10/28/23 2051 10/29/23 0529  BP: 126/75 98/69 127/73 115/64  Pulse: 100 80 100 76  Resp: 18 18 20 19   Temp: 98.1 F (36.7 C) 97.9 F (36.6 C) 99.4 F (37.4 C) 98.7 F (37.1 C)  TempSrc:  Oral Oral Oral  SpO2: 91% 91% 92% 97%  Weight: 128.3 kg     Height:       Physical Exam General:chronically ill appearing male in NAD Heart:RRR, no mrg Lungs:CTAB, nml WOB on RA Abdomen:soft, NTND Extremities:no LE edema Dialysis Access: L AVF +b/t   Filed Weights   10/27/23 0702 10/27/23 1745 10/28/23 0426  Weight: 126.6 kg 125.5 kg 128.3 kg    Intake/Output Summary (Last 24 hours) at 10/29/2023 1204 Last data filed at 10/29/2023 0951 Gross per 24 hour  Intake 720 ml  Output --  Net 720 ml    Additional Objective Labs: Basic Metabolic Panel: Recent Labs  Lab 10/24/23 0553  NA 133*  K 5.9*  CL 88*  CO2 28  GLUCOSE 105*  BUN 88*  CREATININE 15.04*  CALCIUM  10.4*  PHOS 10.0*   Liver Function Tests: Recent Labs  Lab 10/24/23 0553  ALBUMIN 3.6   CBC: Recent Labs  Lab 10/24/23 0553  WBC 10.9*  NEUTROABS 6.5  HGB 11.3*  HCT 34.7*  MCV 98.9  PLT 363   Blood Culture    Component Value Date/Time   SDES TRACHEAL ASPIRATE 10/08/2023 0627   SPECREQUEST NONE 10/08/2023 0627   CULT  10/08/2023 0627    Normal respiratory flora-no Staph aureus or Pseudomonas seen Performed at [facility], [address] [place], [place] [address]    REPTSTATUS 10/10/2023 FINAL 10/08/2023 0627    CBG: Recent Labs  Lab 10/22/23 1229 10/22/23 1659 10/22/23 1957 10/25/23 2203  GLUCAP 107* 87 130* 143*    Medications:   aspirin   81 mg Oral Daily   atorvastatin   40 mg Oral Daily   carvedilol   12.5 mg Oral  BID   Chlorhexidine  Gluconate Cloth  6 each Topical Q0600   Chlorhexidine  Gluconate Cloth  6 each Topical Q0600   clopidogrel   75 mg Oral Daily   docusate  100 mg Oral BID   lanthanum   1,000 mg Oral TID WC   midodrine   5 mg Oral BID WC   mouth rinse  15 mL Mouth Rinse 4 times per day   pantoprazole   40 mg Oral QHS    Dialysis Orders: TTS - AF 4:15hr, 500/600, EDW 138kg, 2K/2C bath, AVF, heparin  4000+ - Mircera 50mcg given 10/06/23 - Hectoral 7mcg IV q HD - Takes Auryxia  3/meals and Xphozah 30mg  BID   Assessment/Plan: Acute R MCA CVA: S/p TNK 7/25. Required intubation, now on [place] O2. Ongoing L sided weakness. Per neuro. Working with PT currently. Now in CIR. AHRF, ?aspiration + CVA: Now extubated, on [employer] [place] O2. S/p Unasyn  course. WBC normalized.  ESRD: Continue HD on TTS schedule. Next HD 8/18. Hyperkalemia: S/p Lokelma  X 3 doses. Last K+ now is 5.9. Need updated labs, to collect prior to HD. Likely needs longer HD but limited due to staffing.  If continues to be elevated may need to order fistulogram to r/o recirculation.  HTN/volume: BP in  goal.  On carvedilol  12.5mg  BID and midodrine  5mg  BID.  Reduce carvedilol  to 6.25 mg BID d/t hypotension. Do not give prior to HD.  Amlodipine  stopped. Reports hypotension with HD, use albumin for BP support.  UF as tolerated.  Anemia of ESRD: Hgb 11.3, no ESA for now. Secondary HPTH: Corrected Ca 10.6 and Phos 11.4, VDRA on hold. Home auryxia  can not be given via NG tube - change to fosrenol  for now - increased dose. Nutrition: Alb 3.4, getting TF + protein supplements. Per RD - unable to do Nepro at this point, will change in future.  On dysphagia 1 diet.   Nathan Labella, PA-C [facility] 10/29/2023,12:04 PM  LOS: 11 days

## 2023-10-29 NOTE — Progress Notes (Signed)
 Physical Therapy Session Note  Patient Details  Name: [name] MRN: [id number] Date of Birth: 19-Oct-1975  Today's Date: 10/29/2023 PT Individual Time: 8691-8674 PT Individual Time Calculation (min): 17 min   Short Term Goals: Week 2:  PT Short Term Goal 1 (Week 2): STG = LTG due to ELOS  Skilled Therapeutic Interventions/Progress Updates: Patient sitting in WC on entrance to room. Patient alert and agreeable to PT session.   Patient reported no dizziness or nausea today. Pt continues to report global itchiness (PTA donned vaseline). Pt performed sit<>stands to assess presentation of orthostatics with supervision and no AD. Pt denied symptoms. Pt with L side of torso elevated greater than R. Pt and pt mother stated this has been baseline. PTA attempted to obtain leg length discrepancy to see if that is a contributing factor (education provided), but transportation services arrived to bring pt to dialysis. PTA will record measurement at future session.  Pt performed sit<supine with bed flat and did so modI with use of railing.       Therapy Documentation Precautions:  Precautions Precautions: Fall Precaution/Restrictions Comments: L hemipareisis UE>LE, L hemianopsia, cortrak, watch SpO2 and RR Restrictions Weight Bearing Restrictions Per Provider Order: No   Therapy/Group: Individual Therapy  Lennette Fader PTA 10/29/2023, 1:37 PM

## 2023-10-29 NOTE — Progress Notes (Signed)
 PROGRESS NOTE   Subjective/Complaints: BP is still soft, nephro recommends reduced coreg  Appreciate nephrology following, has HD today   ROS: Patient denies fever, rash, sore throat,  vomiting, diarrhea, cough, shortness of breath or chest pain, joint or back/neck pain, headache, or mood change.   Objective:   No results found.  Recent Labs    10/29/23 1430  WBC 8.9  HGB 11.2*  HCT 34.4*  PLT 307    Recent Labs    10/29/23 0830  NA 134*  K 5.0  CL 91*  CO2 24  GLUCOSE 113*  BUN 77*  CREATININE 16.24*  CALCIUM  9.9     Intake/Output Summary (Last 24 hours) at 10/29/2023 1739 Last data filed at 10/29/2023 0951 Gross per 24 hour  Intake 480 ml  Output --  Net 480 ml        Physical Exam: Vital Signs Blood pressure 90/60, pulse 92, temperature 98.2 F (36.8 C), resp. rate 18, height 6' (1.829 m), weight 130.1 kg, SpO2 (!) 88%.   Constitutional: No distress . Vital signs reviewed. Obese, stable HEENT: NCAT, EOMI, oral membranes moist Neck: supple Cardiovascular: tachy without murmur. No JVD    Respiratory/Chest: CTA Bilaterally without wheezes or rales. Normal effort    GI/Abdomen: BS +, non-tender, non-distended Ext: no clubbing, cyanosis, or edema Psych: pleasant and cooperative  Skin: No evidence of breakdown, no evidence of rash  PRIOR EXAMS: Neurologic:alert, cranial nerves II through XII grossly intact, follows simple commands. Speech a little dysarthric.  motor strength is 5/5 in right deltoid, bicep, tricep, grip, hip flexor, knee extensors, ankle dorsiflexor and plantar flexor 2+ to 3-/5 in LUE at delt and biceps and grip, 1+ to 2-/5 L finger and arm extension , 4- LLE muscle groups as above  Sensory exam normal sensation to light touch  in bilateral lower extremities Absent LT LUE remains Cerebellar exam not tested on LUE due to weakness Musculoskeletal: pain free  range of motion in all 4  extremities. No joint swelling Prior neuro assessment is c/w today's exam 10/29/2023.   Assessment/Plan: 1. Functional deficits which require 3+ hours per day of interdisciplinary therapy in a comprehensive inpatient rehab setting. Physiatrist is providing close team supervision and 24 hour management of active medical problems listed below. Physiatrist and rehab team continue to assess barriers to discharge/monitor patient progress toward functional and medical goals  Care Tool:  Bathing    Body parts bathed by patient: Chest, Abdomen, Front perineal area, Right upper leg, Left upper leg, Face, Left arm, Buttocks, Right lower leg, Left lower leg   Body parts bathed by helper: Right arm     Bathing assist Assist Level: Minimal Assistance - Patient > 75%     Upper Body Dressing/Undressing Upper body dressing   What is the patient wearing?: Pull over shirt    Upper body assist Assist Level: Minimal Assistance - Patient > 75%    Lower Body Dressing/Undressing Lower body dressing      What is the patient wearing?: Pants, Incontinence brief     Lower body assist Assist for lower body dressing: Moderate Assistance - Patient 50 - 74%     Toileting Toileting  Toileting Activity did not occur (Clothing management and hygiene only): N/A (no void or bm) (dialysis patient)  Toileting assist Assist for toileting: Moderate Assistance - Patient 50 - 74%     Transfers Chair/bed transfer  Transfers assist     Chair/bed transfer assist level: Supervision/Verbal cueing     Locomotion Ambulation   Ambulation assist      Assist level: Contact Guard/Touching assist Assistive device: No Device Max distance: 150'   Walk 10 feet activity   Assist     Assist level: Contact Guard/Touching assist Assistive device: No Device   Walk 50 feet activity   Assist Walk 50 feet with 2 turns activity did not occur: Safety/medical concerns  Assist level: Contact Guard/Touching  assist Assistive device: No Device    Walk 150 feet activity   Assist Walk 150 feet activity did not occur: Safety/medical concerns  Assist level: Contact Guard/Touching assist Assistive device: No Device    Walk 10 feet on uneven surface  activity   Assist Walk 10 feet on uneven surfaces activity did not occur: Safety/medical concerns         Wheelchair     Assist Is the patient using a wheelchair?: Yes Type of Wheelchair: Manual    Wheelchair assist level: Total Assistance - Patient < 25% Max wheelchair distance: 220'    Wheelchair 50 feet with 2 turns activity    Assist        Assist Level: Dependent - Patient 0%   Wheelchair 150 feet activity     Assist      Assist Level: Dependent - Patient 0%   Blood pressure 90/60, pulse 92, temperature 98.2 F (36.8 C), resp. rate 18, height 6' (1.829 m), weight 130.1 kg, SpO2 (!) 88%.  Medical Problem List and Plan: 1. Functional deficits secondary to acute R MCA CVA with right M1 occlusion s/p TNK 7/25 and IR with TICI2b likely large vessel disease.             -patient may shower             -ELOS/Goals: 11/04/23   supervision            -Continue CIR therapies including PT, OT . Continue activities as tolerated today. See # 18 below    2.  Antithrombotics: -DVT/anticoagulation:  Pharmaceutical: Heparin  will d/c since ambulation is >57' with PT             -antiplatelet therapy: Aspirin  and Plavix  x 3 months then Aspirin  alone.  3. Pain Management: Tylenol  and Flexeril  (back pain) as needed 4. Mood/Behavior/Sleep: Therapy evaluations completed due to patient decreased functional mobility was admitted for a comprehensive rehab program.              -antipsychotic agents: N/A 5. Neuropsych/cognition: This patient is not capable of making decisions on his own behalf. 6. Skin/Wound Care: Routine pressure relief measures Pruritus without rash , likely due to renal disease 8/14- cont Sarna lotion and  prn benadryl --encouraged him to use benadryl  for sx 7. Fluids/Electrolytes/Nutrition: Monitor I/O and Daily weights.              - TF + protein supplements.  Dysphagia - upgraded to dysphagia 1 diet -TF d/ced Taking po very well , 100% meals although fluid intake recorded is still low  8. ESRD on hemodialysis: BUN 139, Cr 18.98 HD on TTHS schedule- --Labs to be collected in HD             -  Hyperphos/Hyperkalemia - per Nephro 9. Chronic Anemia of ESRD: hgb 10.9 from 11.6 continue to monitor  10. Acute hypoxic/hypercapnic respiratory failure: On 2 L [place] CXR Vague lingular and left lower lobe airspace opacification    Suspect underlying OSA   11. Leukocytosis:     Latest Ref Rng & Units 10/29/2023    2:30 PM 10/24/2023    5:53 AM 10/22/2023    5:00 AM  CBC  WBC 4.0 - 10.5 K/uL 8.9  10.9  9.1   Hemoglobin 13.0 - 17.0 g/dL 88.7  88.6  88.8   Hematocrit 39.0 - 52.0 % 34.4  34.7  34.0   Platelets 150 - 400 K/uL 307  363  353    8/14 remains afebrile 12. Combined systolic and diastolic H: home meds amlodipine , isosorbide  and carvedilol .  Fluid management per nephrology.             - Monitor for orthostasis with activity.  13. Secondary HPTH: Home auryxia  cannot be given via NG tube--continue Fosrenol    14. HLD: LDL 85 continue Atorvastatin  40 mg   15. Obesity: BMI 39.77 educate on diet and weight loss to promote overall health and mobility.   16.  Hypertension/hypotension: Long-term goal normotensive.    Vitals:   10/29/23 1705 10/29/23 1720  BP: (!) 100/57 90/60  Pulse: 96 92  Resp: (!) 30 18  Temp:    SpO2: 97% (!) 88%   Increase coreg  to 12.5 still hypertensive with elevated HR -8/13 d/c Norvasc  d/t orthostatic hypotension Also hold isordil  , cont coreg  due to tachy 8/15 pt remains orthostatic, hypotensive. Worse today since post-HD. Nephrology aware  -continue to push fluids  -begin trial of midodrine  5mg  with breakfast, lunch  -TH TEDS  -acclimate prior to activity  -volume  adjustments in HD?  Coreg  decreased to 6.25mg  BID 17.  Hyperlipidemia: continue statin          LOS: 11 days A FACE TO FACE EVALUATION WAS PERFORMED  Sven SHAUNNA Elks 10/29/2023, 5:39 PM

## 2023-10-29 NOTE — Plan of Care (Signed)
   Problem: Consults Goal: RH STROKE PATIENT EDUCATION Description: See Patient Education module for education specifics  Outcome: Progressing   Problem: RH BOWEL ELIMINATION Goal: RH STG MANAGE BOWEL WITH ASSISTANCE Description: STG Manage Bowel with mod I Assistance. Outcome: Progressing Goal: RH STG MANAGE BOWEL W/MEDICATION W/ASSISTANCE Description: STG Manage Bowel with Medication with mod I Assistance. Outcome: Progressing   Problem: RH SAFETY Goal: RH STG ADHERE TO SAFETY PRECAUTIONS W/ASSISTANCE/DEVICE Description: STG Adhere to Safety Precautions With cues Assistance/Device. Outcome: Progressing   Problem: RH PAIN MANAGEMENT Goal: RH STG PAIN MANAGED AT OR BELOW PT'S PAIN GOAL Description: Pain < 4 with prns Outcome: Progressing   Problem: RH KNOWLEDGE DEFICIT Goal: RH STG INCREASE KNOWLEDGE OF HYPERTENSION Description: Patient and family will be able to manage HTN using educational resources for medications and dietary modification independently Outcome: Progressing Goal: RH STG INCREASE KNOWLEDGE OF DYSPHAGIA/FLUID INTAKE Description: Patient and family will be able to manage dysphagia using educational resources for medications and dietary modification independently Outcome: Progressing Goal: RH STG INCREASE KNOWLEGDE OF HYPERLIPIDEMIA Description: Patient and family will be able to manage HLD using educational resources for medications and dietary modification independently Outcome: Progressing Goal: RH STG INCREASE KNOWLEDGE OF STROKE PROPHYLAXIS Description: Patient and family will be able to manage secondary risks using educational resources for medications and dietary modification independently Outcome: Progressing

## 2023-10-30 MED ORDER — CHLORHEXIDINE GLUCONATE CLOTH 2 % EX PADS
6.0000 | MEDICATED_PAD | Freq: Every day | CUTANEOUS | Status: DC
  Administered 2023-10-31 – 2023-11-02 (×3): 6 via TOPICAL

## 2023-10-30 MED ORDER — WITCH HAZEL-GLYCERIN EX PADS
MEDICATED_PAD | CUTANEOUS | Status: DC | PRN
  Filled 2023-10-30: qty 100

## 2023-10-30 NOTE — Progress Notes (Signed)
 [facility] Progress Note   Subjective:   Seen and examined at bedside.  Complains of not being able to sleep well due to size of the bed.  Also itching.  No other specific complaints.  Denies CP, HA, SOB and edema.   Objective Vitals:   10/29/23 1849 10/29/23 1938 10/30/23 0500 10/30/23 1230  BP: 100/67 109/69 110/75 103/70  Pulse:  84 80 (!) 101  Resp: (!) 32 18 17 18   Temp:  97.7 F (36.5 C) 97.7 F (36.5 C) 98.3 F (36.8 C)  TempSrc:  Oral Oral Oral  SpO2: 95% 98% 97% 100%  Weight:   128.9 kg   Height:       Physical Exam General:chronically ill appearing male in NAD Heart:RRR, no mrg Lungs:CTAB, nml WOB on RA Abdomen:soft, NTND Extremities:no LE edema Dialysis Access: LU AVF   Filed Weights   10/29/23 1351 10/29/23 1826 10/30/23 0500  Weight: 130.1 kg 128.4 kg 128.9 kg    Intake/Output Summary (Last 24 hours) at 10/30/2023 1414 Last data filed at 10/30/2023 1200 Gross per 24 hour  Intake 236 ml  Output 1500 ml  Net -1264 ml    Additional Objective Labs: Basic Metabolic Panel: Recent Labs  Lab 10/24/23 0553 10/29/23 0830 10/29/23 1959  NA 133* 134* 135  K 5.9* 5.0 3.9  CL 88* 91* 92*  CO2 28 24 28   GLUCOSE 105* 113* 92  BUN 88* 77* 33*  CREATININE 15.04* 16.24* 8.47*  CALCIUM  10.4* 9.9 9.5  PHOS 10.0* 8.2* 4.1   Liver Function Tests: Recent Labs  Lab 10/24/23 0553 10/29/23 0830 10/29/23 1959  ALBUMIN 3.6 3.5 3.6    CBC: Recent Labs  Lab 10/24/23 0553 10/29/23 1430  WBC 10.9* 8.9  NEUTROABS 6.5  --   HGB 11.3* 11.2*  HCT 34.7* 34.4*  MCV 98.9 101.2*  PLT 363 307   Medications:   aspirin   81 mg Oral Daily   atorvastatin   40 mg Oral Daily   carvedilol   6.25 mg Oral BID   Chlorhexidine  Gluconate Cloth  6 each Topical Q0600   Chlorhexidine  Gluconate Cloth  6 each Topical Q0600   clopidogrel   75 mg Oral Daily   docusate  100 mg Oral BID   lanthanum   1,500 mg Oral TID WC   midodrine   5 mg Oral BID WC   mouth rinse   15 mL Mouth Rinse 4 times per day   pantoprazole   40 mg Oral QHS    Dialysis Orders: TTS - AF 4:15hr, 500/600, EDW 138kg, 2K/2C bath, AVF, heparin  4000+ - Mircera 50mcg given 10/06/23 - Hectoral 7mcg IV q HD - Takes Auryxia  3/meals and Xphozah 30mg  BID   Assessment/Plan: Acute R MCA CVA: S/p TNK 7/25. Required intubation, now on [place] O2. Ongoing L sided weakness. Per neuro. Working with PT currently. Now in CIR. AHRF, ?aspiration + CVA: Now extubated, on [place] O2. S/p Unasyn  course. WBC normalized.  ESRD: Continue HD on TTS schedule. Next HD 8/18. Hyperkalemia: S/p Lokelma  X 3 doses. Last K+ now is 5.9. Need updated labs, to collect prior to HD. Likely needs longer HD but limited due to staffing.  If continues to be elevated may need to order fistulogram to r/o recirculation. Improved 5.0 pre HD. HTN/volume: BP in goal.  On carvedilol  12.5mg  BID and midodrine  5mg  BID.  Reduce carvedilol  to 6.25 mg BID d/t hypotension on 08/29/23. Do not give prior to HD.  Amlodipine  stopped. Reports hypotension with HD, use albumin for  BP support.  UF as tolerated.  Anemia of ESRD: Hgb 11.2, no ESA for now. Secondary HPTH: Corrected Ca  and phos in goal. VDRA on hold d/t hypercalcemia. Home auryxia  can not be given via NG tube - change to fosrenol  for now - increased dose on 8/16. Nutrition: Alb 3.6, getting TF + protein supplements. Per RD - unable to do Nepro at this point, will change in future.  On dysphagia 1 diet.     Manuelita Labella, PA-C [facility] 10/30/2023,2:14 PM  LOS: 12 days

## 2023-10-30 NOTE — Progress Notes (Signed)
 PROGRESS NOTE   Subjective/Complaints: Checked on patient twice this morning and was sleeping soundly. Third time he was alert in [facility], c/o diffuse pruritus, mother(?) says lotion is most helpful   ROS: Patient denies fever, rash, sore throat,  vomiting, diarrhea, cough, shortness of breath or chest pain, joint or back/neck pain, headache, or mood change. +diffuse pruritis  Objective:   No results found.  Recent Labs    10/29/23 1430  WBC 8.9  HGB 11.2*  HCT 34.4*  PLT 307    Recent Labs    10/29/23 0830 10/29/23 1959  NA 134* 135  K 5.0 3.9  CL 91* 92*  CO2 24 28  GLUCOSE 113* 92  BUN 77* 33*  CREATININE 16.24* 8.47*  CALCIUM  9.9 9.5     Intake/Output Summary (Last 24 hours) at 10/30/2023 1147 Last data filed at 10/30/2023 0700 Gross per 24 hour  Intake 118 ml  Output 1500 ml  Net -1382 ml        Physical Exam: Vital Signs Blood pressure 110/75, pulse 80, temperature 97.7 F (36.5 C), temperature source Oral, resp. rate 17, height 6' (1.829 m), weight 128.9 kg, SpO2 97%.   Constitutional: No distress . Vital signs reviewed. Obese, stable, alert, in WC HEENT: NCAT, EOMI, oral membranes moist Neck: supple Cardiovascular: tachy without murmur. No JVD    Respiratory/Chest: CTA Bilaterally without wheezes or rales. Normal effort    GI/Abdomen: BS +, non-tender, non-distended Ext: no clubbing, cyanosis, or edema Psych: pleasant and cooperative  Skin: No evidence of breakdown, no evidence of rash  PRIOR EXAMS: Neurologic:alert, cranial nerves II through XII grossly intact, follows simple commands. Speech a little dysarthric.  motor strength is 5/5 in right deltoid, bicep, tricep, grip, hip flexor, knee extensors, ankle dorsiflexor and plantar flexor 2+ to 3-/5 in LUE at delt and biceps and grip, 1+ to 2-/5 L finger and arm extension , 4- LLE muscle groups as above  Sensory exam normal sensation to light  touch  in bilateral lower extremities Absent LT LUE remains Cerebellar exam not tested on LUE due to weakness Musculoskeletal: pain free  range of motion in all 4 extremities. No joint swelling    Assessment/Plan: 1. Functional deficits which require 3+ hours per day of interdisciplinary therapy in a comprehensive inpatient rehab setting. Physiatrist is providing close team supervision and 24 hour management of active medical problems listed below. Physiatrist and rehab team continue to assess barriers to discharge/monitor patient progress toward functional and medical goals  Care Tool:  Bathing    Body parts bathed by patient: Chest, Abdomen, Front perineal area, Right upper leg, Left upper leg, Face, Left arm, Buttocks, Right lower leg, Left lower leg   Body parts bathed by helper: Right arm     Bathing assist Assist Level: Minimal Assistance - Patient > 75%     Upper Body Dressing/Undressing Upper body dressing   What is the patient wearing?: Pull over shirt    Upper body assist Assist Level: Minimal Assistance - Patient > 75%    Lower Body Dressing/Undressing Lower body dressing      What is the patient wearing?: Pants, Incontinence brief  Lower body assist Assist for lower body dressing: Moderate Assistance - Patient 50 - 74%     Toileting Toileting Toileting Activity did not occur (Clothing management and hygiene only): N/A (no void or bm) (dialysis patient)  Toileting assist Assist for toileting: Moderate Assistance - Patient 50 - 74%     Transfers Chair/bed transfer  Transfers assist     Chair/bed transfer assist level: Supervision/Verbal cueing     Locomotion Ambulation   Ambulation assist      Assist level: Contact Guard/Touching assist Assistive device: No Device Max distance: 150'   Walk 10 feet activity   Assist     Assist level: Contact Guard/Touching assist Assistive device: No Device   Walk 50 feet activity   Assist  Walk 50 feet with 2 turns activity did not occur: Safety/medical concerns  Assist level: Contact Guard/Touching assist Assistive device: No Device    Walk 150 feet activity   Assist Walk 150 feet activity did not occur: Safety/medical concerns  Assist level: Contact Guard/Touching assist Assistive device: No Device    Walk 10 feet on uneven surface  activity   Assist Walk 10 feet on uneven surfaces activity did not occur: Safety/medical concerns         Wheelchair     Assist Is the patient using a wheelchair?: Yes Type of Wheelchair: Manual    Wheelchair assist level: Total Assistance - Patient < 25% Max wheelchair distance: 220'    Wheelchair 50 feet with 2 turns activity    Assist        Assist Level: Dependent - Patient 0%   Wheelchair 150 feet activity     Assist      Assist Level: Dependent - Patient 0%   Blood pressure 110/75, pulse 80, temperature 97.7 F (36.5 C), temperature source Oral, resp. rate 17, height 6' (1.829 m), weight 128.9 kg, SpO2 97%.  Medical Problem List and Plan: 1. Functional deficits secondary to acute R MCA CVA with right M1 occlusion s/p TNK 7/25 and IR with TICI2b likely large vessel disease.             -patient may shower             -ELOS/Goals: 11/04/23   supervision            -Continue CIR therapies including PT, OT . Continue activities as tolerated today. See # 18 below  Asked nursing to get him bariatric bed   2.  Antithrombotics: -DVT/anticoagulation:  Pharmaceutical: Heparin  will d/c since ambulation is >37' with PT             -antiplatelet therapy: continue Aspirin  and Plavix  x 3 months then Aspirin  alone.   3. Pain Management: Tylenol  and Flexeril  (back pain) as needed, kpad ordered  4. Daytime somnolence: d/c hydroxyzine   5. Neuropsych/cognition: This patient is not capable of making decisions on his own behalf.  6. Pruritus without rash , likely due to renal disease: continue lotion which  is most helpful as per family at bedside  7. Fluids/Electrolytes/Nutrition: Monitor I/O and Daily weights.              - TF + protein supplements.  Dysphagia - upgraded to dysphagia 1 diet -TF d/ced Taking po very well , 100% meals although fluid intake recorded is still low   8. ESRD on hemodialysis: BUN 139, Cr 18.98 HD on TTHS schedule- --Labs to be collected in HD             -  Hyperphos/Hyperkalemia - per Nephro 9. Chronic Anemia of ESRD: hgb 10.9 from 11.6 continue to monitor  10. Acute hypoxic/hypercapnic respiratory failure: On 2 L  CXR Vague lingular and left lower lobe airspace opacification    Suspect underlying OSA   11. Leukocytosis:     Latest Ref Rng & Units 10/29/2023    2:30 PM 10/24/2023    5:53 AM 10/22/2023    5:00 AM  CBC  WBC 4.0 - 10.5 K/uL 8.9  10.9  9.1   Hemoglobin 13.0 - 17.0 g/dL 88.7  88.6  88.8   Hematocrit 39.0 - 52.0 % 34.4  34.7  34.0   Platelets 150 - 400 K/uL 307  363  353    8/14 remains afebrile 12. Combined systolic and diastolic H: home meds amlodipine , isosorbide  and carvedilol .  Fluid management per nephrology.             - Monitor for orthostasis with activity.  13. Secondary HPTH: Home auryxia  cannot be given via NG tube--continue Fosrenol    14. HLD: LDL 85 continue Atorvastatin  40 mg   15. Obesity: BMI 39.77 educate on diet and weight loss to promote overall health and mobility.   16.  Hypertension/hypotension: Long-term goal normotensive.    Vitals:   10/29/23 1938 10/30/23 0500  BP: 109/69 110/75  Pulse: 84 80  Resp: 18 17  Temp: 97.7 F (36.5 C) 97.7 F (36.5 C)  SpO2: 98% 97%   Increase coreg  to 12.5 still hypertensive with elevated HR -8/13 d/c Norvasc  d/t orthostatic hypotension Also hold isordil  , cont coreg  due to tachy 8/15 pt remains orthostatic, hypotensive. Worse today since post-HD. Nephrology aware  -continue to push fluids  -begin trial of midodrine  5mg  with breakfast, lunch  -TH TEDS  -acclimate prior to  activity  -volume adjustments in HD?  Coreg  decreased to 6.25mg  BID  D/c hydroxyzine  17.  Hyperlipidemia: continue statin          LOS: 12 days A FACE TO FACE EVALUATION WAS PERFORMED  Sven P Alitzel Cookson 10/30/2023, 11:47 AM

## 2023-10-31 MED ORDER — HYDROXYZINE HCL 10 MG PO TABS
10.0000 mg | ORAL_TABLET | Freq: Three times a day (TID) | ORAL | Status: DC | PRN
  Administered 2023-11-01 – 2023-11-02 (×3): 10 mg via ORAL
  Filled 2023-10-31 (×4): qty 1

## 2023-10-31 NOTE — Progress Notes (Signed)
  [facility] Progress Note   Subjective:   Seen in room. Eating lunch. No new events. No complaints.  Objective Vitals:   10/30/23 2014 10/31/23 0351 10/31/23 0500 10/31/23 1039  BP: 130/79 136/68    Pulse: 90 86    Resp:  18    Temp:  (!) 97.4 F (36.3 C)    TempSrc:      SpO2:  96%    Weight:   129 kg 133 kg  Height:       Physical Exam General: well appearing, nad  Heart: RRR  Lungs: Normal wob  Abdomen: soft, NTND Extremities: no LE edema Dialysis Access: LU AVF   Filed Weights   10/30/23 0500 10/31/23 0500 10/31/23 1039  Weight: 128.9 kg 129 kg 133 kg    Intake/Output Summary (Last 24 hours) at 10/31/2023 1205 Last data filed at 10/31/2023 0831 Gross per 24 hour  Intake 118 ml  Output --  Net 118 ml    Additional Objective Labs: Basic Metabolic Panel: Recent Labs  Lab 10/29/23 0830 10/29/23 1959  NA 134* 135  K 5.0 3.9  CL 91* 92*  CO2 24 28  GLUCOSE 113* 92  BUN 77* 33*  CREATININE 16.24* 8.47*  CALCIUM  9.9 9.5  PHOS 8.2* 4.1   Liver Function Tests: Recent Labs  Lab 10/29/23 0830 10/29/23 1959  ALBUMIN 3.5 3.6    CBC: Recent Labs  Lab 10/29/23 1430  WBC 8.9  HGB 11.2*  HCT 34.4*  MCV 101.2*  PLT 307   Medications:   aspirin   81 mg Oral Daily   atorvastatin   40 mg Oral Daily   carvedilol   6.25 mg Oral BID   Chlorhexidine  Gluconate Cloth  6 each Topical Q0600   clopidogrel   75 mg Oral Daily   docusate  100 mg Oral BID   lanthanum   1,500 mg Oral TID WC   midodrine   5 mg Oral BID WC   mouth rinse  15 mL Mouth Rinse 4 times per day   pantoprazole   40 mg Oral QHS    Dialysis Orders: TTS - AF 4:15hr, 500/600, EDW 138kg, 2K/2C bath, AVF, heparin  4000+ - Mircera 50mcg given 10/06/23 - Hectoral 7mcg IV q HD - Takes Auryxia  3/meals and Xphozah 30mg  BID   Assessment/Plan: Acute CVA: R MCA infarct s/p TNK. Now in CIR. AHRF:  Now extubated, on [employer] O2.  ESRD: Continue HD on TTS schedule. Next HD  8/19. Hyperkalemia: K 3.9. s/p Lokelma .  HTN/volume: BP in goal.  On carvedilol  6.25 mg BID and midodrine  5mg  BID.  Anemia of ESRD: Hgb 11.2, no ESA for now. Secondary HPTH: Corrected Ca  and phos in goal. VDRA on hold d/t hypercalcemia. Was changed to Fosrenol  here.   Nutrition: Dysphagia diet.   Maisie Ronnald Acosta PA-C [facility] 10/31/2023,12:05 PM

## 2023-10-31 NOTE — Progress Notes (Signed)
 PROGRESS NOTE   Subjective/Complaints: Pt still has generalized itching. Bp's have been better. Denies pain this morning. Had good session with OT  ROS: Patient denies fever, rash, sore throat, blurred vision, dizziness, nausea, vomiting, diarrhea, cough, shortness of breath or chest pain, joint or back/neck pain, headache, or mood change.   Objective:   No results found.  Recent Labs    10/29/23 1430  WBC 8.9  HGB 11.2*  HCT 34.4*  PLT 307    Recent Labs    10/29/23 0830 10/29/23 1959  NA 134* 135  K 5.0 3.9  CL 91* 92*  CO2 24 28  GLUCOSE 113* 92  BUN 77* 33*  CREATININE 16.24* 8.47*  CALCIUM  9.9 9.5     Intake/Output Summary (Last 24 hours) at 10/31/2023 1023 Last data filed at 10/31/2023 0831 Gross per 24 hour  Intake 236 ml  Output --  Net 236 ml        Physical Exam: Vital Signs Blood pressure 136/68, pulse 86, temperature (!) 97.4 F (36.3 C), resp. rate 18, height 6' (1.829 m), weight 129 kg, SpO2 96%.   Constitutional: No distress . Vital signs reviewed. HEENT: NCAT, EOMI, oral membranes moist Neck: supple Cardiovascular: RRR without murmur. No JVD    Respiratory/Chest: CTA Bilaterally without wheezes or rales. Normal effort    GI/Abdomen: BS +, non-tender, non-distended Ext: no clubbing, cyanosis, or edema Psych: pleasant and cooperative   Skin: No evidence of breakdown, no evidence of rash Neurologic:alert, cranial nerves II through XII grossly intact, follows simple commands. Speech a little dysarthric.  motor strength is 5/5 in right deltoid, bicep, tricep, grip, hip flexor, knee extensors, ankle dorsiflexor and plantar flexor 2+ to 3-/5 in LUE at delt and biceps and grip, 1+ to 2-/5 L finger and arm extension , 4-LLE Sensory exam normal sensation to light touch  in bilateral lower extremities Absent LT LUE remains Cerebellar exam not tested on LUE due to weakness Musculoskeletal:  pain free  range of motion in all 4 extremities. No joint swelling Prior neuro assessment is c/w 10/31/2023 exam.    Assessment/Plan: 1. Functional deficits which require 3+ hours per day of interdisciplinary therapy in a comprehensive inpatient rehab setting. Physiatrist is providing close team supervision and 24 hour management of active medical problems listed below. Physiatrist and rehab team continue to assess barriers to discharge/monitor patient progress toward functional and medical goals  Care Tool:  Bathing    Body parts bathed by patient: Chest, Abdomen, Front perineal area, Right upper leg, Left upper leg, Face, Left arm, Buttocks, Right lower leg, Left lower leg   Body parts bathed by helper: Right arm     Bathing assist Assist Level: Minimal Assistance - Patient > 75%     Upper Body Dressing/Undressing Upper body dressing   What is the patient wearing?: Pull over shirt    Upper body assist Assist Level: Minimal Assistance - Patient > 75%    Lower Body Dressing/Undressing Lower body dressing      What is the patient wearing?: Pants, Incontinence brief     Lower body assist Assist for lower body dressing: Moderate Assistance - Patient 50 - 74%  Toileting Toileting Toileting Activity did not occur Press photographer and hygiene only): N/A (no void or bm) (dialysis patient)  Toileting assist Assist for toileting: Moderate Assistance - Patient 50 - 74%     Transfers Chair/bed transfer  Transfers assist     Chair/bed transfer assist level: Supervision/Verbal cueing     Locomotion Ambulation   Ambulation assist      Assist level: Contact Guard/Touching assist Assistive device: No Device Max distance: 150'   Walk 10 feet activity   Assist     Assist level: Contact Guard/Touching assist Assistive device: No Device   Walk 50 feet activity   Assist Walk 50 feet with 2 turns activity did not occur: Safety/medical concerns  Assist  level: Contact Guard/Touching assist Assistive device: No Device    Walk 150 feet activity   Assist Walk 150 feet activity did not occur: Safety/medical concerns  Assist level: Contact Guard/Touching assist Assistive device: No Device    Walk 10 feet on uneven surface  activity   Assist Walk 10 feet on uneven surfaces activity did not occur: Safety/medical concerns         Wheelchair     Assist Is the patient using a wheelchair?: Yes Type of Wheelchair: Manual    Wheelchair assist level: Total Assistance - Patient < 25% Max wheelchair distance: 220'    Wheelchair 50 feet with 2 turns activity    Assist        Assist Level: Dependent - Patient 0%   Wheelchair 150 feet activity     Assist      Assist Level: Dependent - Patient 0%   Blood pressure 136/68, pulse 86, temperature (!) 97.4 F (36.3 C), resp. rate 18, height 6' (1.829 m), weight 129 kg, SpO2 96%.  Medical Problem List and Plan: 1. Functional deficits secondary to acute R MCA CVA with right M1 occlusion s/p TNK 7/25 and IR with TICI2b likely large vessel disease.             -patient may shower             -ELOS/Goals: 11/04/23   supervision           -Continue CIR therapies including PT, OT, and SLP   Asked nursing to get him bariatric bed   2.  Antithrombotics: -DVT/anticoagulation:  Pharmaceutical: Heparin  will d/c since ambulation is >38' with PT             -antiplatelet therapy: continue Aspirin  and Plavix  x 3 months then Aspirin  alone.   3. Pain Management: Tylenol  and Flexeril  (back pain) as needed, kpad ordered  4. Daytime somnolence: d/c hydroxyzine   5. Neuropsych/cognition: This patient is not capable of making decisions on his own behalf.  6. Pruritus without rash , likely due to renal disease: continue lotion   -re-ordering atarax  for itching 7. Fluids/Electrolytes/Nutrition: Monitor I/O and Daily weights.              - TF + protein supplements.  Dysphagia -  upgraded to dysphagia 1 diet -TF d/ced Taking po very well , 100% meals although fluid intake recorded is still low   8. ESRD on hemodialysis: BUN 139, Cr 18.98 HD on TTHS schedule- --Labs to be collected in HD             -Hyperphos/Hyperkalemia - per Nephro 9. Chronic Anemia of ESRD: hgb 10.9 from 11.6 continue to monitor  10. Acute hypoxic/hypercapnic respiratory failure: On 2 L Perrin CXR Vague lingular and left  lower lobe airspace opacification    Suspect underlying OSA   11. Leukocytosis:     Latest Ref Rng & Units 10/29/2023    2:30 PM 10/24/2023    5:53 AM 10/22/2023    5:00 AM  CBC  WBC 4.0 - 10.5 K/uL 8.9  10.9  9.1   Hemoglobin 13.0 - 17.0 g/dL 88.7  88.6  88.8   Hematocrit 39.0 - 52.0 % 34.4  34.7  34.0   Platelets 150 - 400 K/uL 307  363  353    8/18 remains afebrile 12. Combined systolic and diastolic H: home meds amlodipine , isosorbide  and carvedilol .  Fluid management per nephrology.             - Monitor for orthostasis with activity.  13. Secondary HPTH: Home auryxia  cannot be given via NG tube--continue Fosrenol    14. HLD: LDL 85 continue Atorvastatin  40 mg   15. Obesity: BMI 39.77 educate on diet and weight loss to promote overall health and mobility.   16.  Hypertension/hypotension: Long-term goal normotensive.    Vitals:   10/30/23 2014 10/31/23 0351  BP: 130/79 136/68  Pulse: 90 86  Resp:  18  Temp:  (!) 97.4 F (36.3 C)  SpO2:  96%   Increase coreg  to 12.5 still hypertensive with elevated HR -8/13 d/c Norvasc  d/t orthostatic hypotension Also hold isordil  , cont coreg  due to tachy 8/18 improved bp's with midodrine   -continue to push fluids  -continue midodrine  5mg  with breakfast, lunch  -TH TEDS  -acclimate prior to activity  -volume adjustments in HD?  Coreg  decreased to 6.25mg  BID  -will resume atarax  17.  Hyperlipidemia: continue statin          LOS: 13 days A FACE TO FACE EVALUATION WAS PERFORMED  Nathan Campbell 10/31/2023, 10:23 AM

## 2023-10-31 NOTE — Progress Notes (Signed)
 Occupational Therapy Session Note  Patient Details  Name: Nathan Campbell MRN: [id number] Date of Birth: Oct 03, 1975  Today's Date: 10/31/2023 OT Individual Time: 0930-1030 OT Individual Time Calculation (min): 60 min    Short Term Goals: Week 1:  OT Short Term Goal 1 (Week 1): Pt will be able to don shirt with min A. OT Short Term Goal 1 - Progress (Week 1): Met OT Short Term Goal 2 (Week 1): Pt will be able to don pants over feet with min A. OT Short Term Goal 2 - Progress (Week 1): Met OT Short Term Goal 3 (Week 1): pt will be able to perform self ROM of LUE. OT Short Term Goal 3 - Progress (Week 1): Met OT Short Term Goal 4 (Week 1): Pt will be able to bathe with min A using long handled sponge. OT Short Term Goal 4 - Progress (Week 1): Met Week 2:  OT Short Term Goal 1 (Week 2): STGs = LTGs  Skilled Therapeutic Interventions/Progress Updates:    Pt received in wc dressed and ready for the day.  His L arm hanging off the seat in a dependent position. Cued pt to lift his arm and place on his lap which he was able to. Pt taken to gym and pt transferred to mat with supervision stand pivot.  Todays session focused on LUE NMR to increase functional use of hand and arm.  He only has fair minus strength (less than 3 lbs) in hand.  From sitting on mat: -UE ranger with constant cues to attend to L hand.  Mod A to have pt to fully extend elbow and then he focused on holding the position -reaching for and stacking cones with max A  Pt then c/o back pain from unsupported sitting and wanted to lay down. From supine: -placing and holding arm overhead and holding for 5-10 seconds several times.  -B hands on 2 lbs dowel bar with max A to hold L grasp and pushing hands toward ceiling Pt kept falling asleep.  Had pt sit back up to resume sitting activities. Pt continued to be lethargic and not attending to L arm well.  Pt transferred back to wc and then to bed at end of session. Pt is used to  sleeping during the day as he worked 3rd shift.  Pt in bed with all needs met.  Therapy Documentation Precautions:  Precautions Precautions: Fall Precaution/Restrictions Comments: L hemipareisis UE>LE, L hemianopsia, cortrak, watch SpO2 and RR Restrictions Weight Bearing Restrictions Per Provider Order: No Pain: Pain Assessment Pain Score: 0-No pain ADL: ADL Eating: Supervision/safety Grooming: Supervision/safety Upper Body Bathing: Minimal assistance Where Assessed-Upper Body Bathing: Shower Lower Body Bathing: Minimal assistance Where Assessed-Lower Body Bathing: Shower Upper Body Dressing: Minimal assistance Lower Body Dressing: Moderate assistance Toileting: Moderate assistance Where Assessed-Toileting: Teacher, adult education: Furniture conservator/restorer Method: Proofreader: Acupuncturist: Administrator, arts Method: Designer, industrial/product: Information systems manager with back  Therapy/Group: Individual Therapy  Denys Labree 10/31/2023, 11:30 AM

## 2023-10-31 NOTE — Progress Notes (Signed)
 Speech Language Pathology Daily Session Note  Patient Details  Name: Nathan Campbell MRN: [id number] Date of Birth: 1975-06-16  Today's Date: 10/31/2023 SLP Individual Time: 1420-1520 SLP Individual Time Calculation (min): 60 min  Short Term Goals: Week 2: SLP Short Term Goal 1 (Week 2): Patient will utilize speech intelligibility strategies to reach 75% intelligibility at the phrase level given mod multimodal A SLP Short Term Goal 2 (Week 2): Patient will demonstrate functional problem solving during daily tasks given min multimodal A SLP Short Term Goal 3 (Week 2): Patient will recall and utilize memory compensatory aids given min multimodal A SLP Short Term Goal 4 (Week 2): Patient will demonstrate awareness of cognitive and physical changes since admission given min multimodal A SLP Short Term Goal 5 (Week 2): Patient will tolerate upgraded bolus trials (Dys2 solids and/or thin liquids) with no overt difficulty/ s/sx of penetration/aspiration prior to determine readiness for diet upgrade.  Skilled Therapeutic Interventions: Skilled therapy session focused on dysphagia and cognitive goals. SLP facilitated session by observing patient consume D3/NTL textures. Patient with timely mastication and mild oral residuals cleared with liquid wash. Patient benefited from min verbal A to check for anterior spillage. No s/sx of aspiration. Recommend upgrade to D3/NTL diet with full supervision. SLP targeted cognitive goals through money management task. Patient intially required supervisionA, though as complexity increased patient required minA. Patient with occasional instances of L attention requiring min verbal cues. Patient left in chair with alarm set and call bell in reach. Continue POC  Pain None reported   Therapy/Group: Individual Therapy  Jalil Lorusso M.A., CCC-SLP 10/31/2023, 7:52 AM

## 2023-10-31 NOTE — Plan of Care (Signed)
   Problem: Consults Goal: RH STROKE PATIENT EDUCATION Description: See Patient Education module for education specifics  Outcome: Progressing   Problem: RH BOWEL ELIMINATION Goal: RH STG MANAGE BOWEL WITH ASSISTANCE Description: STG Manage Bowel with mod I Assistance. Outcome: Progressing Goal: RH STG MANAGE BOWEL W/MEDICATION W/ASSISTANCE Description: STG Manage Bowel with Medication with mod I Assistance. Outcome: Progressing   Problem: RH SAFETY Goal: RH STG ADHERE TO SAFETY PRECAUTIONS W/ASSISTANCE/DEVICE Description: STG Adhere to Safety Precautions With cues Assistance/Device. Outcome: Progressing   Problem: RH PAIN MANAGEMENT Goal: RH STG PAIN MANAGED AT OR BELOW PT'S PAIN GOAL Description: Pain < 4 with prns Outcome: Progressing   Problem: RH KNOWLEDGE DEFICIT Goal: RH STG INCREASE KNOWLEDGE OF HYPERTENSION Description: Patient and family will be able to manage HTN using educational resources for medications and dietary modification independently Outcome: Progressing Goal: RH STG INCREASE KNOWLEDGE OF DYSPHAGIA/FLUID INTAKE Description: Patient and family will be able to manage dysphagia using educational resources for medications and dietary modification independently Outcome: Progressing Goal: RH STG INCREASE KNOWLEGDE OF HYPERLIPIDEMIA Description: Patient and family will be able to manage HLD using educational resources for medications and dietary modification independently Outcome: Progressing Goal: RH STG INCREASE KNOWLEDGE OF STROKE PROPHYLAXIS Description: Patient and family will be able to manage secondary risks using educational resources for medications and dietary modification independently Outcome: Progressing

## 2023-10-31 NOTE — Progress Notes (Signed)
 Nutrition Follow-up  DOCUMENTATION CODES:  Obesity unspecified  INTERVENTION:  Continue current diet as recommended by SLP, upgrade textures as able, currently on dysphagia 1 with nectar thick liquids  Continue fluid restriction per provider request  Discontinue Magic cup, pt experiences tooth pain with cold substances  NUTRITION DIAGNOSIS:  Increased nutrient needs related to acute illness as evidenced by estimated needs; ongoing.  GOAL:  Patient will meet greater than or equal to 90% of their needs; met  MONITOR:  PO intake, Labs, Weight trends  REASON FOR ASSESSMENT:  Consult Calorie Count  ASSESSMENT:  Pt with hx of ESRD on HD, HTN, and CHF transferred to CIR after an acute hospitalization at [facility] for R MCA infarct.  7/28 - cortrak placed while inpatient 7/30 - pt pulled out cortrak, NGT placed 8/1 - NGT pulled out, cortrak replaced 8/4 - MBS, DYS1/nectar thick  8/5 - admitted to CIR 8/7 - TF discontinued and Cortrak removed  Patient remains on a dysphagia 1 diet with nectar thick liquids. Pt's appetite is good, completing 100% of most meals on dysphagia 1. Pt was having issues with emesis last week after breakfast meal, but reports those issues have subsided and no longer having emesis. Pt reports he has a bad tooth and experiences pain with cold supplements, requested to discontinue magic cup. Pt continuing to receive HD treatments TTS with no issues.  Average Meal Completion: 8/14-8/18: 95-100% average intake x 8 recorded meals  Admit weight: 144 kg (7/25 inpatient admission) Current weight: 129 kg    EDW: 138 kg (weight currently below EDW)  Medications reviewed and include:  Colace Lanthanum  w/ meals Protonix   Labs reviewed   Diet Order:   Diet Order             DIET DYS 2 Room service appropriate? Yes; Fluid consistency: Nectar Thick  Diet effective now                   EDUCATION NEEDS:  Education needs have been  addressed  Skin:  Skin Assessment: Reviewed RN Assessment  Last BM:  8/17 type 5  Height:  Ht Readings from Last 1 Encounters:  10/19/23 6' (1.829 m)    Weight:  Wt Readings from Last 1 Encounters:  10/31/23 129 kg    Ideal Body Weight:  80.9 kg  BMI:  Body mass index is 38.57 kg/m.  Estimated Nutritional Needs:  Kcal:  2300-2500 kcal/d Protein:  115-130 g/d Fluid:  1L + UOP   Josette Glance, MS, RDN, LDN Clinical Dietitian I Please reach out via secure chat

## 2023-11-01 LAB — RENAL FUNCTION PANEL
Albumin: 3.4 g/dL — ABNORMAL LOW (ref 3.5–5.0)
Anion gap: 20 — ABNORMAL HIGH (ref 5–15)
BUN: 88 mg/dL — ABNORMAL HIGH (ref 6–20)
CO2: 25 mmol/L (ref 22–32)
Calcium: 9.2 mg/dL (ref 8.9–10.3)
Chloride: 88 mmol/L — ABNORMAL LOW (ref 98–111)
Creatinine, Ser: 18.41 mg/dL — ABNORMAL HIGH (ref 0.61–1.24)
GFR, Estimated: 3 mL/min — ABNORMAL LOW (ref >60)
Glucose, Bld: 91 mg/dL (ref 70–99)
Phosphorus: 7 mg/dL — ABNORMAL HIGH (ref 2.5–4.6)
Potassium: 5.4 mmol/L — ABNORMAL HIGH (ref 3.5–5.1)
Sodium: 133 mmol/L — ABNORMAL LOW (ref 135–145)

## 2023-11-01 LAB — CBC
HCT: 32.2 % — ABNORMAL LOW (ref 39.0–52.0)
Hemoglobin: 10.4 g/dL — ABNORMAL LOW (ref 13.0–17.0)
MCH: 32.3 pg (ref 26.0–34.0)
MCHC: 32.3 g/dL (ref 30.0–36.0)
MCV: 100 fL (ref 80.0–100.0)
Platelets: 239 K/uL (ref 150–400)
RBC: 3.22 MIL/uL — ABNORMAL LOW (ref 4.22–5.81)
RDW: 14.3 % (ref 11.5–15.5)
WBC: 6.7 K/uL (ref 4.0–10.5)
nRBC: 0 % (ref 0.0–0.2)

## 2023-11-01 NOTE — Progress Notes (Signed)
 Occupational Therapy Session Note  Patient Details  Name: Nathan Campbell MRN: [id number] Date of Birth: 1975/08/19  Today's Date: 11/01/2023 OT Individual Time: 1105-1205 OT Individual Time Calculation (min): 60 min    Short Term Goals: Week 2:  OT Short Term Goal 1 (Week 2): STGs = LTGs  Skilled Therapeutic Interventions/Progress Updates:    Pt received in bed sound asleep.  Tried waking pt up and he would open eyes briefly then start snoring again.  Began with PROM to LUE then integrated a/arom activities as pt would keep eyes open.   Contacted his mom to send photos of bathroom set up which she did.  Told pt that his discharge date is Friday but I was concerned he would need a longer LOS as he is often lethargic in therapy and may need more time.  Pt woke suddenly and said  lets go to the gym! I want to go home Friday, I am awake and ready to work!' He did a fantastic job today: -ambulated over 150 2x with no AD with supervision -stepped over tub wall into tub with supervision and stepping into walk in shower S -demonstrated how he can hold grab bar in his walkin shower to stand and lift one foot at a time to wash feet. Pt can stand in shower BUT needs supervision as he did require 1 cue to place L hand on bar -sit to stands from low toilet independently  -LUE NMR with focus on pt ATTENDING to L hand using laundry basket carries, single hand carries, wall slides, placing cones on shelf to simulate putting away glasses, grasp and release activities.  Pt returned to room and stood to wash hands at sink. Resting in wc with belt alarm on and all needs met.  Therapy Documentation Precautions:  Precautions Precautions: Fall Precaution/Restrictions Comments: L hemipareisis UE>LE, L hemianopsia, cortrak, watch SpO2 and RR Restrictions Weight Bearing Restrictions Per Provider Order: No    Vital Signs: Therapy Vitals Temp: 97.8 F (36.6 C) Temp Source: Oral Pulse Rate: (!)  109 Resp: 18 BP: 110/73 Patient Position (if appropriate): Sitting Oxygen Therapy SpO2: 100 % O2 Device: Room Air Pain:  No  c/o pain        Therapy/Group: Individual Therapy  Ladiamond Gallina 11/01/2023, 1:08 PM

## 2023-11-01 NOTE — Progress Notes (Signed)
 Speech Language Pathology Daily Session Note  Patient Details  Name: Nathan Campbell MRN: [id number] Date of Birth: 1975/10/19  Today's Date: 11/01/2023 SLP Individual Time: 9199-9141 SLP Individual Time Calculation (min): 58 min  Short Term Goals: Week 2: SLP Short Term Goal 1 (Week 2): Patient will utilize speech intelligibility strategies to reach 75% intelligibility at the phrase level given mod multimodal A SLP Short Term Goal 2 (Week 2): Patient will demonstrate functional problem solving during daily tasks given min multimodal A SLP Short Term Goal 3 (Week 2): Patient will recall and utilize memory compensatory aids given min multimodal A SLP Short Term Goal 4 (Week 2): Patient will demonstrate awareness of cognitive and physical changes since admission given min multimodal A SLP Short Term Goal 5 (Week 2): Patient will tolerate upgraded bolus trials (Dys2 solids and/or thin liquids) with no overt difficulty/ s/sx of penetration/aspiration prior to determine readiness for diet upgrade.  Skilled Therapeutic Interventions: Skilled therapy session focused on dysphagia and communication goals. SLP facilitated session by aiding patient in dressing and transfer to [facility] with cane. Patient then consumed breakfast tray with occasional coughing, though consistent with baseline (before/after PO). Patient required min verbal A to check for anterior spillage from L. Observed timely mastication and oral clearance with use of liquid wash. Recommend continuation of current diet (D3/NTL) with repeat MBS before d/c. SLP targeted communication at the phrase level. Patient recalled 3/4 SLOP strategies independently and remaining with minA. Patient utilized strategies with minA to reach 70% intelligibility at the phrase level. Patient left in [facility] with call bell in reach. Continue POC.   Pain None reported   Therapy/Group: Individual Therapy  Denard Tuminello M.A., CCC-SLP 11/01/2023, 7:36 AM

## 2023-11-01 NOTE — Progress Notes (Signed)
  [facility] Progress Note   Subjective:   Seen in room. No new events. Feels well. Dialysis today.   Objective Vitals:   10/31/23 1337 10/31/23 2013 11/01/23 0331 11/01/23 1019  BP: 107/71 126/73 137/86 110/73  Pulse: 99 88 98 (!) 109  Resp: 19 18 18 18   Temp: 98 F (36.7 C) 98.2 F (36.8 C) 97.6 F (36.4 C) 97.8 F (36.6 C)  TempSrc:    Oral  SpO2: 93% 95% 92% 100%  Weight:      Height:       Physical Exam General: well appearing, nad  Heart: RRR  Lungs: Normal wob  Abdomen: soft, NTND Extremities: no LE edema Dialysis Access: LU AVF   Filed Weights   10/30/23 0500 10/31/23 0500 10/31/23 1039  Weight: 128.9 kg 129 kg 133 kg    Intake/Output Summary (Last 24 hours) at 11/01/2023 1237 Last data filed at 11/01/2023 0825 Gross per 24 hour  Intake 598 ml  Output --  Net 598 ml    Additional Objective Labs: Basic Metabolic Panel: Recent Labs  Lab 10/29/23 0830 10/29/23 1959  NA 134* 135  K 5.0 3.9  CL 91* 92*  CO2 24 28  GLUCOSE 113* 92  BUN 77* 33*  CREATININE 16.24* 8.47*  CALCIUM  9.9 9.5  PHOS 8.2* 4.1   Liver Function Tests: Recent Labs  Lab 10/29/23 0830 10/29/23 1959  ALBUMIN 3.5 3.6    CBC: Recent Labs  Lab 10/29/23 1430  WBC 8.9  HGB 11.2*  HCT 34.4*  MCV 101.2*  PLT 307   Medications:   aspirin   81 mg Oral Daily   atorvastatin   40 mg Oral Daily   carvedilol   6.25 mg Oral BID   Chlorhexidine  Gluconate Cloth  6 each Topical Q0600   clopidogrel   75 mg Oral Daily   docusate  100 mg Oral BID   lanthanum   1,500 mg Oral TID WC   midodrine   5 mg Oral BID WC   mouth rinse  15 mL Mouth Rinse 4 times per day   pantoprazole   40 mg Oral QHS    Dialysis Orders: TTS - AF 4:15hr, 500/600, EDW 138kg, 2K/2C bath, AVF, heparin  4000+ - Mircera 50mcg given 10/06/23 - Hectoral 7mcg IV q HD - Takes Auryxia  3/meals and Xphozah 30mg  BID   Assessment/Plan: Acute CVA: R MCA infarct s/p TNK. Now in CIR. AHRF:  Now  extubated, on [place] O2.  ESRD: Continue HD on TTS schedule. Next HD 8/19. Hyperkalemia: K 3.9. s/p Lokelma .  HTN/volume: BP in goal.  On carvedilol  6.25 mg BID and midodrine  5mg  BID.  Anemia of ESRD: Hgb 11.2, no ESA for now. Secondary HPTH: Corrected Ca  and phos in goal. VDRA on hold d/t hypercalcemia. Was changed to Fosrenol  here.   Nutrition: Dysphagia diet.   Maisie Ronnald Acosta PA-C  [facility] 11/01/2023,12:37 PM

## 2023-11-01 NOTE — Progress Notes (Signed)
 Physical Therapy Session Note  Patient Details  Name: Nathan Campbell MRN: [id number] Date of Birth: 1975/08/13  Today's Date: 11/01/2023 PT Individual Time: 0905-1015 PT Individual Time Calculation (min): 70 min   Short Term Goals: Week 2:  PT Short Term Goal 1 (Week 2): STG = LTG due to ELOS  Skilled Therapeutic Interventions/Progress Updates: Patient sitting in WC on entrance to room. Patient alert and agreeable to PT session.   Patient reported no pain and no nausea/lightheadedness today. Pt continues to report global itchiness with only relief from vaseline.  Therapeutic Activity: Transfers: Pt performed sit<>stand transfers throughout session with supervision and no AD in preparation for functional mobility. Pt with improved control of descent  6 Min Walk Test:  Instructed patient to ambulate as quickly and as safely as possible for 6 minutes using LRAD. Patient was allowed to take standing rest breaks without stopping the test, but if the patient required a sitting rest break the clock would be stopped and the test would be over.  Results: 1062 feet (323.698 meters, Avg speed .817m/s) using no AD with close supervision for safety. Results indicate that the patient has reduced endurance with ambulation compared to age matched norms.  Age Matched Norms: 42-69 yo M: 22 F: 72, 33-79 yo M: 76 F: 471, 46-89 yo M: 417 F: 392 MDC: 58.21 meters (190.98 feet) or 50 meters (ANPTA Core Set of Outcome Measures for Adults with Neurologic Conditions, 2018)  Pt navigated 11 7 steps in stairway outside of main gym with RHR ascending/descending. Pt with overall close supervision and step to pattern with VC to ensure entire foot is placed on step (front of shoe touching bottom of next step). Pt required extended rest break at top of steps on chair with reports of SOB, and required another seated rest break back in gym after descending.   Neuromuscular Re-ed: NMR facilitated during session with  focus on coordination, dynamic standing balance, L sided awareness, motor control of L UE to move reciprocally to R LE. - Pt ambulated throughout session with no AD and with supervision and VC to increase reciprocal arm swing as well as to increase awareness to L side as pt hugs L side of hallway (lightly grazing some objects). Pt has improved R gaze preference, but still requires education to increase awareness to L. - Pt ambulated 2 rounds with hockey stick in R UE, and PTA using yellow theraband pulling in various directions to simulate pt walking small dog at home. Pt performed without LOB and with overall close supervision. PTA introduced perturbations on 2nd round with pt demonstrating ability to adjust accordingly to maintain standing balance. - Pt navigated 6 hurdles and VC to avoid circumduction compensation. Pt did so without LOB and supervision. Pt progressed to alternating heights ranging from 6-11 in same manner without issue.   NMR performed for improvements in motor control and coordination, balance, sequencing, judgement, and self confidence/ efficacy in performing all aspects of mobility at highest level of independence.   Patient sitting in WC at end of session with brakes locked, belt alarm set, and all needs within reach.      Therapy Documentation Precautions:  Precautions Precautions: Fall Precaution/Restrictions Comments: L hemipareisis UE>LE, L hemianopsia, cortrak, watch SpO2 and RR Restrictions Weight Bearing Restrictions Per Provider Order: No   Therapy/Group: Individual Therapy  Campbell Agramonte PTA 11/01/2023, 12:59 PM

## 2023-11-01 NOTE — Progress Notes (Signed)
 Physical Therapy Session Note  Patient Details  Name: Nathan Campbell MRN: [id number] Date of Birth: 05/29/1975  Today's Date: 10/31/2023 PT Individual Time: 0802-0918 PT Individual Time Calculation (min): 76 min  Short Term Goals: Week 1:  PT Short Term Goal 1 (Week 1): Pt will demosntrate bed mobility with CGA and good technique. PT Short Term Goal 1 - Progress (Week 1): Met PT Short Term Goal 2 (Week 1): pt will perform standing transfers with consistent SBA/ CGA. PT Short Term Goal 2 - Progress (Week 1): Met PT Short Term Goal 3 (Week 1): Pt will perform ambulation up to 100 ft using LRAD with CGA. PT Short Term Goal 3 - Progress (Week 1): Met PT Short Term Goal 4 (Week 1): Pt will demonstrate stair navigation up /down at least 8 steps with overall MinA. PT Short Term Goal 4 - Progress (Week 1): Met PT Short Term Goal 5 (Week 1): Pt will complete appropriate outcome measure. PT Short Term Goal 5 - Progress (Week 1): Met Week 2:  PT Short Term Goal 1 (Week 2): STG = LTG due to ELOS  Skilled Therapeutic Interventions/Progress Updates:  Patient seated upright in w/c on entrance to room. Patient alert and agreeable to PT session. NT providing supervision while finishing breakfast.   Patient with no pain complaint at start of session.  Pt performed sit<>stand and stand pivot transfers throughout session with close supervision. Provided vc/ tc for decreasing use of RUE and decreasing shift toward R side for posture to improve midline posturing and to increase LLE use.  Ambulated to therapy gym and back with supervision/ CGA and no AD use. VC for LLE floor clearance and increased knee flexion. Pt able to lie supine on mat table and leg lengths measured. No LLD noted more than 1/8th of an inch with LLE shorter than RLE.   NMR facilitated during session with focus on LLE motor control, standing balance, midline orientation. Pt guided in placement of LLE onto first 6 step, then second, then  third. To remove, pt pulls foot down each step. Focus then on lift of LE for removal instead of drag of foot. Is able to perform with vc only and supervision.   Progressed to forward lunges into second step with pt able to perform with no UE support x10. Performed with good control and strength. Then guided in LLE quick, dynamic step up onto 6 step and slow eccentric return to floor. Then pt able to complete 3x 4 steps prior to need to sit. Pt  NMR performed for improvements in motor control and coordination, balance, sequencing, judgement, and self confidence/ efficacy in performing all aspects of mobility at highest level of independence.   While sitting, pt relates need to toilet. Returned to room dependently in w/c for time. Pt is able to ambulate into bathroom with no AD and requires CGA for clothing mgmt. Is abel to perform pericare with setup and MinA for donning pants.   Next guided in collection of small foam blocks using L hand from areas around room at differing heights. Requires increased focus and vc for increased pinch to pick up squares and hold it long enough to place in cup therapist held at shoulder heights. Difficult to maintain grasp to blocks but does not drop any on floor.   Patient seated upright in w/c at end of session with brakes locked, belt alarm set, and all needs within reach. Oriented to time and time of next session in 15 min with  OT.    Therapy Documentation Precautions:  Precautions Precautions: Fall Precaution/Restrictions Comments: L hemipareisis UE>LE, L hemianopsia, cortrak, watch SpO2 and RR Restrictions Weight Bearing Restrictions Per Provider Order: No  Pain:  No pain related this session.    Therapy/Group: Individual Therapy  Mliss DELENA Milliner PT, DPT, CSRS 10/31/2023, 10:26 AM

## 2023-11-01 NOTE — Progress Notes (Signed)
 PROGRESS NOTE   Subjective/Complaints: Pt had some nausea, maybe reflux yesterday. Denies dizziness.   ROS: Patient denies fever, rash, sore throat, blurred vision, dizziness, nausea, vomiting, diarrhea, cough, shortness of breath or chest pain, joint or back/neck pain, headache, or mood change.    Objective:   No results found.  Recent Labs    10/29/23 1430  WBC 8.9  HGB 11.2*  HCT 34.4*  PLT 307    Recent Labs    10/29/23 1959  NA 135  K 3.9  CL 92*  CO2 28  GLUCOSE 92  BUN 33*  CREATININE 8.47*  CALCIUM  9.5     Intake/Output Summary (Last 24 hours) at 11/01/2023 1025 Last data filed at 11/01/2023 0825 Gross per 24 hour  Intake 598 ml  Output --  Net 598 ml        Physical Exam: Vital Signs Blood pressure 110/73, pulse (!) 109, temperature 97.8 F (36.6 C), temperature source Oral, resp. rate 18, height 6' (1.829 m), weight 133 kg, SpO2 100%.   Constitutional: No distress . Vital signs reviewed. HEENT: NCAT, EOMI, oral membranes moist Neck: supple Cardiovascular: RRR without murmur. No JVD    Respiratory/Chest: CTA Bilaterally without wheezes or rales. Normal effort    GI/Abdomen: BS +, non-tender, non-distended Ext: no clubbing, cyanosis, or edema Psych: pleasant and cooperative  Skin: No evidence of breakdown, no evidence of rash Neurologic:alert, mild left VII,  Speech a little dysarthric.  motor strength is 5/5 in right deltoid, bicep, tricep, grip, hip flexor, knee extensors, ankle dorsiflexor and plantar flexor 2+ to 3-/5 in LUE at delt and biceps and grip, 1+ to 2-/5 L finger and arm extension , 4-LLE Sensory exam normal sensation to light touch  in bilateral lower extremities Absent to minimal LT LUE remains Musculoskeletal: pain free  range of motion in all 4 extremities. No joint swelling Prior neuro assessment is c/w 11/01/2023 exam.    Assessment/Plan: 1. Functional deficits which  require 3+ hours per day of interdisciplinary therapy in a comprehensive inpatient rehab setting. Physiatrist is providing close team supervision and 24 hour management of active medical problems listed below. Physiatrist and rehab team continue to assess barriers to discharge/monitor patient progress toward functional and medical goals  Care Tool:  Bathing    Body parts bathed by patient: Chest, Abdomen, Front perineal area, Right upper leg, Left upper leg, Face, Left arm, Buttocks, Right lower leg, Left lower leg   Body parts bathed by helper: Right arm     Bathing assist Assist Level: Minimal Assistance - Patient > 75%     Upper Body Dressing/Undressing Upper body dressing   What is the patient wearing?: Pull over shirt    Upper body assist Assist Level: Minimal Assistance - Patient > 75%    Lower Body Dressing/Undressing Lower body dressing      What is the patient wearing?: Pants, Incontinence brief     Lower body assist Assist for lower body dressing: Moderate Assistance - Patient 50 - 74%     Toileting Toileting Toileting Activity did not occur (Clothing management and hygiene only): N/A (no void or bm) (dialysis patient)  Toileting assist Assist for  toileting: Moderate Assistance - Patient 50 - 74%     Transfers Chair/bed transfer  Transfers assist     Chair/bed transfer assist level: Supervision/Verbal cueing     Locomotion Ambulation   Ambulation assist      Assist level: Contact Guard/Touching assist Assistive device: No Device Max distance: 150'   Walk 10 feet activity   Assist     Assist level: Contact Guard/Touching assist Assistive device: No Device   Walk 50 feet activity   Assist Walk 50 feet with 2 turns activity did not occur: Safety/medical concerns  Assist level: Contact Guard/Touching assist Assistive device: No Device    Walk 150 feet activity   Assist Walk 150 feet activity did not occur: Safety/medical  concerns  Assist level: Contact Guard/Touching assist Assistive device: No Device    Walk 10 feet on uneven surface  activity   Assist Walk 10 feet on uneven surfaces activity did not occur: Safety/medical concerns         Wheelchair     Assist Is the patient using a wheelchair?: Yes Type of Wheelchair: Manual    Wheelchair assist level: Total Assistance - Patient < 25% Max wheelchair distance: 220'    Wheelchair 50 feet with 2 turns activity    Assist        Assist Level: Dependent - Patient 0%   Wheelchair 150 feet activity     Assist      Assist Level: Dependent - Patient 0%   Blood pressure 110/73, pulse (!) 109, temperature 97.8 F (36.6 C), temperature source Oral, resp. rate 18, height 6' (1.829 m), weight 133 kg, SpO2 100%.  Medical Problem List and Plan: 1. Functional deficits secondary to acute R MCA CVA with right M1 occlusion s/p TNK 7/25 and IR with TICI2b likely large vessel disease.             -patient may shower             -ELOS/Goals: 11/04/23   supervision            -Continue CIR therapies including PT, OT, and SLP    Asked nursing to get him bariatric bed   2.  Antithrombotics: -DVT/anticoagulation:  Pharmaceutical: Heparin  will d/c since ambulation is >42' with PT             -antiplatelet therapy: continue Aspirin  and Plavix  x 3 months then Aspirin  alone.   3. Pain Management: Tylenol  and Flexeril  (back pain) as needed, kpad ordered  4. Daytime somnolence: d/c hydroxyzine   5. Neuropsych/cognition: This patient is not capable of making decisions on his own behalf.  6. Pruritus without rash , likely due to renal disease: continue lotion   -re-ordering atarax  for itching  -no change in plan 7. Fluids/Electrolytes/Nutrition: Monitor I/O and Daily weights.              - TF + protein supplements.  Dysphagia - upgraded to dysphagia 3 diet   8/19 Taking po very well, fluid intake could be better, still on nectars   8. ESRD  on hemodialysis: BUN 139, Cr 18.98 HD on TTHS schedule- --Labs to be collected in HD             -Hyperphos/Hyperkalemia - per Nephro 9. Chronic Anemia of ESRD: hgb 10.9 from 11.6 continue to monitor  10. Acute hypoxic/hypercapnic respiratory failure: On 2 L [place] CXR Vague lingular and left lower lobe airspace opacification    Suspect underlying OSA   11. Leukocytosis:  Latest Ref Rng & Units 10/29/2023    2:30 PM 10/24/2023    5:53 AM 10/22/2023    5:00 AM  CBC  WBC 4.0 - 10.5 K/uL 8.9  10.9  9.1   Hemoglobin 13.0 - 17.0 g/dL 88.7  88.6  88.8   Hematocrit 39.0 - 52.0 % 34.4  34.7  34.0   Platelets 150 - 400 K/uL 307  363  353    8/18 remains afebrile 12. Combined systolic and diastolic H: home meds amlodipine , isosorbide  and carvedilol .  Fluid management per nephrology.             - Monitor for orthostasis with activity.  13. Secondary HPTH: Home auryxia  cannot be given via NG tube--continue Fosrenol    14. HLD: LDL 85 continue Atorvastatin  40 mg   15. Obesity: BMI 39.77 educate on diet and weight loss to promote overall health and mobility.   16.  Hypertension/hypotension: Long-term goal normotensive.    Vitals:   11/01/23 0331 11/01/23 1019  BP: 137/86 110/73  Pulse: 98 (!) 109  Resp: 18 18  Temp: 97.6 F (36.4 C) 97.8 F (36.6 C)  SpO2: 92% 100%   Increase coreg  to 12.5 still hypertensive with elevated HR -8/13 d/c Norvasc  d/t orthostatic hypotension Also hold isordil  , cont coreg  due to tachy 8/19 improved bp's with midodrine   -continue to encourage appropriate fluids  -continue midodrine  5mg  with breakfast, lunch  -TH TEDS  -acclimate prior to activity  -volume adjustments in HD?  Coreg  decreased to 6.25mg  BID  -resumed atarax --received dose early this morning 17.  Hyperlipidemia: continue statin          LOS: 14 days A FACE TO FACE EVALUATION WAS PERFORMED  Nathan Campbell 11/01/2023, 10:25 AM

## 2023-11-02 ENCOUNTER — Inpatient Hospital Stay (HOSPITAL_COMMUNITY)

## 2023-11-02 MED ORDER — CHLORHEXIDINE GLUCONATE CLOTH 2 % EX PADS: 6.0000 | MEDICATED_PAD | Freq: Every day | CUTANEOUS | Status: DC

## 2023-11-02 MED ORDER — SODIUM ZIRCONIUM CYCLOSILICATE 10 G PO PACK
10.0000 g | PACK | Freq: Two times a day (BID) | ORAL | Status: AC
  Administered 2023-11-02 (×2): 10 g via ORAL
  Filled 2023-11-02 (×2): qty 1

## 2023-11-02 NOTE — Progress Notes (Signed)
 Speech Language Pathology Daily Session Note  Patient Details  Name: Nathan Campbell MRN: [id number] Date of Birth: [birth year]  Today's Date: 11/02/2023 SLP Individual Time: 9149-9064 1500-1530 SLP Individual Time Calculation (min): 45 min 30 minutes  Short Term Goals: Week 2: SLP Short Term Goal 1 (Week 2): Patient will utilize speech intelligibility strategies to reach 75% intelligibility at the phrase level given mod multimodal A SLP Short Term Goal 2 (Week 2): Patient will demonstrate functional problem solving during daily tasks given min multimodal A SLP Short Term Goal 3 (Week 2): Patient will recall and utilize memory compensatory aids given min multimodal A SLP Short Term Goal 4 (Week 2): Patient will demonstrate awareness of cognitive and physical changes since admission given min multimodal A SLP Short Term Goal 5 (Week 2): Patient will tolerate upgraded bolus trials (Dys2 solids and/or thin liquids) with no overt difficulty/ s/sx of penetration/aspiration prior to determine readiness for diet upgrade.  Skilled Therapeutic Interventions: Session 1: Skilled therapy session focused on dysphagia, communication and cognitive goals. SLP provided set up A for oral care at the sink and offered tsp and cup sips of thin liquids. Patient with wet vocal quality on initial swallow, though eliminated with cued throat clear. Patient with x1 delayed cough, though no other s/sx of aspiration. Recommend continuation of current diet with plan for MBS tomorrow to assess thin liquids via instrumental. SLP targeted cognitive goals through re-completing Cognistat standardized assessment. Patient intially with moderate-severe deficits in the realms of attention, memory and problem solving. Today, patient scored WFL on memory, moderate deficits in attention (number recall) and mild deficits in problem solving (specifically calculations) - a significant improvement since evaluation. Of note, patient reports  increased difficulty in activities incorporating numbers. At the end of the session, SLP targeted communication. Patient recalled 4/4 SLOP strategies independently. Patient utilized strategies with minA to reach 80% intelligibility at the phrase level. Patient left in chair with alarm set and call bell in reach. Continue POC  Session 2: Skilled therapy session focused on family education. SLP reviewed patients current cognitive functioning, diet recommendations and speech intelligibility strategies. SLP provided hands on education re: thickening to NTL and answered all questions posed by patient and family. SLP recommended continuation of use of speech intelligibility strategies and reviewed plan for MBS tomorrow. Patient and family verbalized understanding of all education provided. Patient left in chair with alarm set and call bell in reach. Continue POC.   Pain None reported   Therapy/Group: Individual Therapy  Judee Hennick M.A., CCC-SLP 11/02/2023, 7:33 AM

## 2023-11-02 NOTE — Progress Notes (Signed)
 Patient ID: Nathan Campbell, male   DOB: Oct 13, 1975, 48 y.o.   MRN: [id number]  SW met with pt and pt mother in room to review discharge. SW shared updates from team conference, and d/c date remains on 8/22. SW shared his transportation is already in place with [facility] [place] and can have someone accompany him. SW shared [facility]- [facility].   [name] [name], MSW, LCSW Office: [phone] Cell: [phone] Fax: [phone]

## 2023-11-02 NOTE — Progress Notes (Signed)
 Occupational Therapy Session Note  Patient Details  Name: Nathan Campbell MRN: [id number] Date of Birth: 1975/10/12  Today's Date: 11/02/2023 OT Individual Time: 1306-1403 OT Individual Time Calculation (min): 57 min    Short Term Goals: Week 2:  OT Short Term Goal 1 (Week 2): STGs = LTGs  Skilled Therapeutic Interventions/Progress Updates:  Pt greeted asleep in supine, max cues to arouse, but then pt agreeable to OT intervention.   BP from supine- 112/61    Transfers/bed mobility/functional mobility: pt completed supine>sit with CGA. Stand pivot to w/c with no AD and supervision.   Pt sat up in w/c for lunch while OTA educated mother [name] on the below topics:  -always using gait belt for functional mobility -no AD needed for functional mobility  -recommendation to check BP at home to manage [facility] -recommendation to stand and take a second to take deep breaths to assess dizziness - general assist needed for ADLS and functional mobility ( I.e MIN A for UB ADLS, supervision for bathing with bath mit and LH sponge and S for LB ADLs)  -general education provided on L inattention and LUE hemiparesis handling techniques.  -decreasing clutter/fall hazards in the home - discussed DME needs, reviewed various shower seats with mother and pt reporting tub shower. Agreed on TTB with family provided with handout from [place] to purchase -education provided on shower transfers to TTB, [name] able to return demo ambulatory transfer to TTB with close CGA -educated provided to mother on gait belt handling strategy.  -demonstrated provided on UB hemi dressing technique for mother  Family demonstrated appropriate level of assist with ADLs and functional mobility    Ended session with pt handed off in gym for next session.             Therapy Documentation Precautions:  Precautions Precautions: Fall Precaution/Restrictions Comments: L hemipareisis UE>LE, L hemianopsia, cortrak, watch SpO2  and RR Restrictions Weight Bearing Restrictions Per Provider Order: No  Pain: no pain     Therapy/Group: Individual Therapy  [name] 11/02/2023, 2:14 PM

## 2023-11-02 NOTE — Plan of Care (Signed)
  Problem: Consults Goal: RH STROKE PATIENT EDUCATION Description: See Patient Education module for education specifics  Outcome: Progressing   Problem: RH BOWEL ELIMINATION Goal: RH STG MANAGE BOWEL WITH ASSISTANCE Description: STG Manage Bowel with mod I Assistance. Outcome: Progressing Goal: RH STG MANAGE BOWEL W/MEDICATION W/ASSISTANCE Description: STG Manage Bowel with Medication with mod I  Assistance. Outcome: Progressing

## 2023-11-02 NOTE — Progress Notes (Signed)
 Physical Therapy Session Note  Patient Details  Name: Nivin Braniff MRN: [id number] Date of Birth: 06-10-1975  Today's Date: 11/02/2023 PT Individual Time: 1403-1501 PT Individual Time Calculation (min): 58 min   Short Term Goals: Week 2:  PT Short Term Goal 1 (Week 2): STG = LTG due to ELOS  Skilled Therapeutic Interventions/Progress Updates: Patient sitting in ortho gym in [facility] with mother present following OT session. Patient alert and agreeable to PT session.   Patient reported no pain during session. Pt reported tickle in throat and required emetic bag to spit up saliva. No emesis event occurred and pt denied nausea/lightheadedness. Pt mother with increased concern regarding sleep study following d/c home. PTA communicated with appropriate channels to further assist with the steps to ensure this is taken care of ASAP for safety concerns as pt has been noted to start to fall asleep quickly when sitting and standing (standing with nsg per mother report - pt has presented in such a manner during session when sitting for rest breaks).   Therapeutic Activity: - Pt ambulated from ortho gym<main gym with no AD and with supervision and continued cuing to increase awareness to L side with education provided to mother. - Pt performed furniture transfer (independently) and bed transfer/mobility (independently transferred into bed with hands and knees first and performed roll to supine with no issue, and then performed supine to L sidelying to sit to EOB) in ADL apartment.  - Pt navigated 4 (6) steps with RHR ascending/descending with supervision and only cue to ensure entire foot placed on step - Pt participated in floor transfer from mat to hi/low mat with VC/demonstrative cues for sequence to roll to R side, and to ensure safety of movement by assessing if any injuries occurred. Pt performed with CGA  Patient sitting in WC at end of session with brakes locked, mother present, belt alarm set, and  all needs within reach.      Therapy Documentation Precautions:  Precautions Precautions: Fall Precaution/Restrictions Comments: L hemipareisis UE>LE, L hemianopsia, cortrak, watch SpO2 and RR Restrictions Weight Bearing Restrictions Per Provider Order: No  Therapy/Group: Individual Therapy  Mandell Pangborn PTA 11/02/2023, 3:19 PM

## 2023-11-02 NOTE — Progress Notes (Signed)
 Occupational Therapy Session Note  Patient Details  Name: Nathan Campbell MRN: [id number] Date of Birth: Jul 30, 1975  Today's Date: 11/02/2023 OT Individual Time: 8964-8884 OT Individual Time Calculation (min): 40 min    Short Term Goals: Week 1:  OT Short Term Goal 1 (Week 1): Pt will be able to don shirt with min A. OT Short Term Goal 1 - Progress (Week 1): Met OT Short Term Goal 2 (Week 1): Pt will be able to don pants over feet with min A. OT Short Term Goal 2 - Progress (Week 1): Met OT Short Term Goal 3 (Week 1): pt will be able to perform self ROM of LUE. OT Short Term Goal 3 - Progress (Week 1): Met OT Short Term Goal 4 (Week 1): Pt will be able to bathe with min A using long handled sponge. OT Short Term Goal 4 - Progress (Week 1): Met Week 2:  OT Short Term Goal 1 (Week 2): STGs = LTGs  Skilled Therapeutic Interventions/Progress Updates:    Pt received in bed ready for shower.  Pt able to get out of bed, ambulate in room and bathroom, retreive clothing from w/c, step in shower, sit on seat, shower using sponge mit on L hand and long handled sponge all with supervision.  For dressing he continues to need min A and mod cues for hemidressing techniques and attention to L side.  He needs cues to start L arm in sleeve but then min A with adjusting shirt.  He does better in standing than sitting. For pants he can don over feet but needs A to pull over L side of hips. Pt not able to even with hand over hand guiding due to limited L hand grasp.  Pt wanted to spend time donning lotion and powder.   OT session time running over so his NTs called to bring in pt new socks and adjust belt alarm.   Therapy Documentation Precautions:  Precautions Precautions: Fall Precaution/Restrictions Comments: L hemipareisis UE>LE, L hemianopsia, cortrak, watch SpO2 and RR Restrictions Weight Bearing Restrictions Per Provider Order: No Therapy Vitals Temp: 97.6 F (36.4 C) Temp Source: Oral Pulse  Rate: (!) 102 Resp: 18 BP: (!) 80/59 Patient Position (if appropriate): Sitting Oxygen Therapy SpO2: 98 % O2 Device: Room Air Pain: Pain Assessment Pain Scale: 0-10 Pain Score: 0-No pain ADL: ADL Eating: Supervision/safety Grooming: Supervision/safety Where Assessed-Grooming: Standing at sink Upper Body Bathing: Supervision/safety Where Assessed-Upper Body Bathing: Shower Lower Body Bathing: Supervision/safety Where Assessed-Lower Body Bathing: Shower Upper Body Dressing: Minimal assistance Lower Body Dressing: Minimal assistance Toileting: Minimal assistance Where Assessed-Toileting: Teacher, adult education: Close supervision Toilet Transfer Method: Proofreader: Chiropractor Transfer: Close supervison Web designer Method: Ship broker: Acupuncturist: Close supervision Film/video editor Method: Designer, industrial/product: Grab bars, Shower seat with back   Therapy/Group: Individual Therapy  Nizhoni Parlow 11/02/2023, 12:38 PM

## 2023-11-02 NOTE — Progress Notes (Signed)
  [facility] Progress Note   Subjective:   Seen and examined in his room. Nathan Campbell was laying in bed in no acute distress. He reports that they are working him hard in therapy. His speech appears to be improving. He has more ROM in his left arm today. Plan for HD 11/03/23  Objective Vitals:   11/01/23 2000 11/01/23 2022 11/01/23 2216 11/02/23 1021  BP: (!) 84/74 111/73 128/87 (!) 134/104  Pulse: 87 90 89 94  Resp: (!) 28 (!) 25 18 18   Temp:  97.9 F (36.6 C) 98 F (36.7 C) 97.6 F (36.4 C)  TempSrc:   Oral Oral  SpO2: 97% 99% 94% 98%  Weight:  131.3 kg    Height:       Physical Exam General: well appearing, nad  Heart: RRR  Lungs: Normal wob  Abdomen: soft, NTND Extremities: no LE edema Dialysis Access: LU AVF   Filed Weights   10/31/23 0500 10/31/23 1039 11/01/23 2022  Weight: 129 kg 133 kg 131.3 kg    Intake/Output Summary (Last 24 hours) at 11/02/2023 1053 Last data filed at 11/02/2023 0800 Gross per 24 hour  Intake 481 ml  Output 1700 ml  Net -1219 ml    Additional Objective Labs: Basic Metabolic Panel: Recent Labs  Lab 10/29/23 0830 10/29/23 1959 11/01/23 1733  NA 134* 135 133*  K 5.0 3.9 5.4*  CL 91* 92* 88*  CO2 24 28 25   GLUCOSE 113* 92 91  BUN 77* 33* 88*  CREATININE 16.24* 8.47* 18.41*  CALCIUM  9.9 9.5 9.2  PHOS 8.2* 4.1 7.0*   Liver Function Tests: Recent Labs  Lab 10/29/23 0830 10/29/23 1959 11/01/23 1733  ALBUMIN 3.5 3.6 3.4*    CBC: Recent Labs  Lab 10/29/23 1430 11/01/23 1733  WBC 8.9 6.7  HGB 11.2* 10.4*  HCT 34.4* 32.2*  MCV 101.2* 100.0  PLT 307 239   Medications:   aspirin   81 mg Oral Daily   atorvastatin   40 mg Oral Daily   carvedilol   6.25 mg Oral BID   Chlorhexidine  Gluconate Cloth  6 each Topical Q0600   clopidogrel   75 mg Oral Daily   docusate  100 mg Oral BID   lanthanum   1,500 mg Oral TID WC   midodrine   5 mg Oral BID WC   mouth rinse  15 mL Mouth Rinse 4 times per day   pantoprazole   40  mg Oral QHS    Dialysis Orders: TTS - AF 4:15hr, 500/600, EDW 138kg, 2K/2C bath, AVF, heparin  4000+ - Mircera 50mcg given 10/06/23 - Hectoral 7mcg IV q HD - Takes Auryxia  3/meals and Xphozah 30mg  BID   Assessment/Plan: Acute CVA: R MCA infarct s/p TNK. Now in CIR. AHRF:  Now extubated, on [place] O2.  ESRD: Continue HD on TTS schedule. Next HD 8/21. Hyperkalemia: K 5.4. 2 doses of Lokelma  ordered today until he gets HD.   HTN/volume: BP in goal.  On carvedilol  6.25 mg BID and midodrine  5mg  BID.  Anemia of ESRD: Hgb 10.4, no ESA for now. Secondary HPTH: Corrected Ca in goal. Phos 7 and he is on his home binders. VDRA on hold d/t hypercalcemia. Was changed to Fosrenol  here.   Nutrition: Dysphagia diet.   Belvie Och, NP [facility] 11/02/2023,10:53 AM

## 2023-11-02 NOTE — Progress Notes (Signed)
 PROGRESS NOTE   Subjective/Complaints: Overall feeling well. A little constipated. Good appetite. Still itchy  ROS: Patient denies fever, rash, sore throat, blurred vision, dizziness, nausea, vomiting, diarrhea, cough, shortness of breath or chest pain, joint or back/neck pain, headache, or mood change.   Objective:   No results found.  Recent Labs    11/01/23 1733  WBC 6.7  HGB 10.4*  HCT 32.2*  PLT 239    Recent Labs    11/01/23 1733  NA 133*  K 5.4*  CL 88*  CO2 25  GLUCOSE 91  BUN 88*  CREATININE 18.41*  CALCIUM  9.2     Intake/Output Summary (Last 24 hours) at 11/02/2023 0859 Last data filed at 11/02/2023 0800 Gross per 24 hour  Intake 481 ml  Output 1700 ml  Net -1219 ml        Physical Exam: Vital Signs Blood pressure 128/87, pulse 89, temperature 98 F (36.7 C), temperature source Oral, resp. rate 18, height 6' (1.829 m), weight 131.3 kg, SpO2 94%.   Constitutional: No distress . Vital signs reviewed. HEENT: NCAT, EOMI, oral membranes moist Neck: supple Cardiovascular: RRR without murmur. No JVD    Respiratory/Chest: CTA Bilaterally without wheezes or rales. Normal effort    GI/Abdomen: BS +, non-tender, non-distended Ext: no clubbing, cyanosis, or edema Psych: pleasant and cooperative  Skin: No evidence of breakdown, no evidence of rash Neurologic:alert, mild left VII,  Speech a little dysarthric.  motor strength is 5/5 in right deltoid, bicep, tricep, grip, hip flexor, knee extensors, ankle dorsiflexor and plantar flexor 2+ to 3-/5 in LUE at delt and biceps and grip, 1+ to 2-/5 L finger and arm extension , 4-LLE Sensory exam normal sensation to light touch  in bilateral lower extremities Absent to minimal LT LUE remains--didn't realize he had food on wrist this am Musculoskeletal: pain free  range of motion in all 4 extremities. No joint swelling Prior neuro assessment is c/w 11/02/2023  exam.    Assessment/Plan: 1. Functional deficits which require 3+ hours per day of interdisciplinary therapy in a comprehensive inpatient rehab setting. Physiatrist is providing close team supervision and 24 hour management of active medical problems listed below. Physiatrist and rehab team continue to assess barriers to discharge/monitor patient progress toward functional and medical goals  Care Tool:  Bathing    Body parts bathed by patient: Chest, Abdomen, Front perineal area, Right upper leg, Left upper leg, Face, Left arm, Buttocks, Right lower leg, Left lower leg   Body parts bathed by helper: Right arm     Bathing assist Assist Level: Minimal Assistance - Patient > 75%     Upper Body Dressing/Undressing Upper body dressing   What is the patient wearing?: Pull over shirt    Upper body assist Assist Level: Minimal Assistance - Patient > 75%    Lower Body Dressing/Undressing Lower body dressing      What is the patient wearing?: Pants, Incontinence brief     Lower body assist Assist for lower body dressing: Moderate Assistance - Patient 50 - 74%     Toileting Toileting Toileting Activity did not occur (Clothing management and hygiene only): N/A (no void or bm) (  dialysis patient)  Toileting assist Assist for toileting: Moderate Assistance - Patient 50 - 74%     Transfers Chair/bed transfer  Transfers assist     Chair/bed transfer assist level: Supervision/Verbal cueing     Locomotion Ambulation   Ambulation assist      Assist level: Contact Guard/Touching assist Assistive device: No Device Max distance: 150'   Walk 10 feet activity   Assist     Assist level: Contact Guard/Touching assist Assistive device: No Device   Walk 50 feet activity   Assist Walk 50 feet with 2 turns activity did not occur: Safety/medical concerns  Assist level: Contact Guard/Touching assist Assistive device: No Device    Walk 150 feet activity   Assist Walk  150 feet activity did not occur: Safety/medical concerns  Assist level: Contact Guard/Touching assist Assistive device: No Device    Walk 10 feet on uneven surface  activity   Assist Walk 10 feet on uneven surfaces activity did not occur: Safety/medical concerns         Wheelchair     Assist Is the patient using a wheelchair?: Yes Type of Wheelchair: Manual    Wheelchair assist level: Total Assistance - Patient < 25% Max wheelchair distance: 220'    Wheelchair 50 feet with 2 turns activity    Assist        Assist Level: Dependent - Patient 0%   Wheelchair 150 feet activity     Assist      Assist Level: Dependent - Patient 0%   Blood pressure 128/87, pulse 89, temperature 98 F (36.7 C), temperature source Oral, resp. rate 18, height 6' (1.829 m), weight 131.3 kg, SpO2 94%.  Medical Problem List and Plan: 1. Functional deficits secondary to acute R MCA CVA with right M1 occlusion s/p TNK 7/25 and IR with TICI2b likely large vessel disease.             -patient may shower             -ELOS/Goals: 11/04/23   supervision            -Continue CIR therapies including PT, OT, and SLP. Interdisciplinary team conference today to discuss goals, barriers to discharge, and dc planning.     2.  Antithrombotics: -DVT/anticoagulation:  Pharmaceutical: Heparin  will d/c since ambulation is >53' with PT             -antiplatelet therapy: continue Aspirin  and Plavix  x 3 months then Aspirin  alone.   3. Pain Management: Tylenol  and Flexeril  (back pain) as needed, kpad ordered  4. Daytime somnolence: d/c hydroxyzine   5. Neuropsych/cognition: This patient is not capable of making decisions on his own behalf.  6. Pruritus without rash , likely due to renal disease: continue lotion   -resumed atarax  for itching  -no change in plan 7. Fluids/Electrolytes/Nutrition: Monitor I/O and Daily weights.              - TF + protein supplements.  Dysphagia - upgraded to  dysphagia 3 diet   8/19 Taking po very well, fluid intake could be better, still on nectars   8. ESRD on hemodialysis: BUN 139, Cr 18.98 HD on TTHS schedule- --Labs to be collected in HD             -Hyperphos/Hyperkalemia - per Nephro 9. Chronic Anemia of ESRD: hgb 10.9 from 11.6 continue to monitor  10. Acute hypoxic/hypercapnic respiratory failure: On 2 L [place] CXR Vague lingular and left lower lobe airspace opacification  Suspect underlying OSA   11. Leukocytosis:     Latest Ref Rng & Units 11/01/2023    5:33 PM 10/29/2023    2:30 PM 10/24/2023    5:53 AM  CBC  WBC 4.0 - 10.5 K/uL 6.7  8.9  10.9   Hemoglobin 13.0 - 17.0 g/dL 89.5  88.7  88.6   Hematocrit 39.0 - 52.0 % 32.2  34.4  34.7   Platelets 150 - 400 K/uL 239  307  363    8/20 remains afebrile 12. Combined systolic and diastolic H: home meds amlodipine , isosorbide  and carvedilol .  Fluid management per nephrology.             - Monitor for orthostasis with activity.  13. Secondary HPTH: Home auryxia  cannot be given via NG tube--continue Fosrenol    14. HLD: LDL 85 continue Atorvastatin  40 mg   15. Obesity: BMI 39.77 educate on diet and weight loss to promote overall health and mobility.   16.  Hypertension/hypotension: Long-term goal normotensive.    Vitals:   11/01/23 2022 11/01/23 2216  BP: 111/73 128/87  Pulse: 90 89  Resp: (!) 25 18  Temp: 97.9 F (36.6 C) 98 F (36.7 C)  SpO2: 99% 94%   Increase coreg  to 12.5 still hypertensive with elevated HR -8/13 d/c Norvasc  d/t orthostatic hypotension Also hold isordil  , cont coreg  due to tachy 8/20 improved bp's with midodrine   -continue to encourage appropriate fluids  -continue midodrine  5mg  with breakfast, lunch  -TH TEDS  -acclimate prior to activity  -volume adjustments in HD?  Coreg  decreased to 6.25mg  BID  -resumed atarax --without isues 17.  Hyperlipidemia: continue statin  18. Slow transit constipation  -add senokot-s 2 tabs at bedtime  -LBM 8/18         LOS: 15 days A FACE TO FACE EVALUATION WAS PERFORMED  Nathan Campbell 11/02/2023, 8:59 AM

## 2023-11-02 NOTE — Patient Care Conference (Signed)
 Inpatient RehabilitationTeam Conference and Plan of Care Update Date: 11/02/2023   Time: 10:11 AM    Patient Name: Nathan Campbell      Medical Record Number: [id number]  Date of Birth: [birth year] Sex: Male         Room/Bed: 4M10C/4M10C-01 Payor Info: Payor: Multimedia programmer / Plan: [employer]HC MEDICARE / Product Type: *No Product type* /    Admit Date/Time:  10/18/2023  6:10 PM  Primary Diagnosis:  Middle cerebral artery embolism, right  Hospital Problems: Principal Problem:   Middle cerebral artery embolism, right Active Problems:   Cognitive and neurobehavioral dysfunction    Expected Discharge Date: Expected Discharge Date: 11/04/23  Team Members Present: Physician leading conference: Dr. Murray Collier Social Worker Present: [name] [name], LCSW Nurse Present: Barnie Ronde, RN PT Present: Sherlean Perks, PT;Dominic Freddi, PTA OT Present: Recardo Maxwell, OT SLP Present: Blaise Alderman, SLP PPS Coordinator present : Eleanor Colon, SLP     Current Status/Progress Goal Weekly Team Focus  Bowel/Bladder      Oliguria; ESRD on HD Constipation    Manage bowel w mod I assist    Constipation addressed  Swallow/Nutrition/ Hydration   D3/NTL   supervision  education, repeat MBS, pharyngeal strengthening, safe swallowing strategies    ADL's   min A/CGA overall with self care due to L hemiparesis. close S with shower and tub transfers   SUP overall for ADL, CGA tub transfers   LUE NMR, balance, ADL training, fam education    Mobility   Bed Mobility = independent; Transfers supervision no AD; Ambulation = supervision no AD; Stairs up to 11 with RHR ascending/descending and close supervision   Overall supervision/modI  Barriers = Lethargy; Focus = family education; NMR L hemibody, ambulation without AD, dynamic standing balance, stairs    Communication   60% intelligible at phrase independently   minA phrase 80% intelligible   IOPI, SLOP Strategies     Safety/Cognition/ Behavioral Observations  min-modA   minA   problem solving, memory, attention, awareness    Pain      Toothache , puritis    Pain < 4 with prns    Prn medications for discomfort  Skin      Abrasion on left shin; left hemiparesis and neglect   Wound healing, no new injury    Dressing change to wound    Discharge Planning:  Pt will d/c to home with support from his mother who will be primary caregiver. SHe will initially take to dialysis. [facility] [place] application for transportation was previously submitted and pt has been active since June 2024. Fam edu on Wed 1pm-4pm with pt mother. [facility]HA- [facility] for HHPT/OT/SLP.  SW will confirm there are no barriers to discharge.   Team Discussion: Patient admitted post right MCA CVA with cognitive deficits, dysarthria, left hemianopsia with left upper extremity weakness and minimal verbal cognitive deficits. Functional progress limited by fatigue, nausea and orthostasis.   Patient on target to meet rehab goals: yes, currently needs min assist for self care. Needs supervision assist for ambulation up to 350'  and was able to ambulate with close supervision > 1000' without using adaptive equipment and without loss of balance Currently 70% accuracy for phrase intelligibility.   *See Care Plan and progress notes for long and short-term goals.   Revisions to Treatment Plan:  N/a   Teaching Needs: Safety, medications, dietary modification, skin care, transfers, toileting, etc.   Current Barriers to Discharge: Decreased caregiver support and Home enviroment access/layout  Possible Resolutions to Barriers: Family education HH follow up services     Medical Summary Current Status: pt with ongoing right sided weakness and sensory loss. bp's have been better with adjustments in HD, midodrine . periodic nausea, persistent pruritus  Barriers to Discharge: Medical stability;Renal Insufficiency/Failure   Possible Resolutions to  [employer]: ongoing HD per nephrology, symptomatic mgt of pruritis, encouraging appropriate fluid intake.   Continued Need for Acute Rehabilitation Level of Care: The patient requires daily medical management by a physician with specialized training in physical medicine and rehabilitation for the following reasons: Direction of a multidisciplinary physical rehabilitation program to maximize functional independence : Yes Medical management of patient stability for increased activity during participation in an intensive rehabilitation regime.: Yes Analysis of laboratory values and/or radiology reports with any subsequent need for medication adjustment and/or medical intervention. : Yes   I attest that I was present, lead the team conference, and concur with the assessment and plan of the team.   Fredericka Sober B 11/02/2023, 1:35 PM

## 2023-11-03 ENCOUNTER — Inpatient Hospital Stay (HOSPITAL_COMMUNITY)

## 2023-11-03 ENCOUNTER — Other Ambulatory Visit (HOSPITAL_COMMUNITY): Payer: Self-pay

## 2023-11-03 ENCOUNTER — Encounter (HOSPITAL_COMMUNITY): Payer: Self-pay

## 2023-11-03 MED ORDER — MIDODRINE HCL 5 MG PO TABS
5.0000 mg | ORAL_TABLET | Freq: Two times a day (BID) | ORAL | 0 refills | Status: AC
  Filled 2023-11-03: qty 60, 30d supply, fill #0

## 2023-11-03 MED ORDER — ASPIRIN 81 MG PO CHEW
81.0000 mg | CHEWABLE_TABLET | Freq: Every day | ORAL | 0 refills | Status: AC
  Filled 2023-11-03: qty 30, 30d supply, fill #0

## 2023-11-03 MED ORDER — LANTHANUM CARBONATE 750 MG PO CHEW
1500.0000 mg | CHEWABLE_TABLET | Freq: Three times a day (TID) | ORAL | 0 refills | Status: AC
  Filled 2023-11-03: qty 180, 30d supply, fill #0

## 2023-11-03 MED ORDER — CLOPIDOGREL BISULFATE 75 MG PO TABS
75.0000 mg | ORAL_TABLET | Freq: Every day | ORAL | 0 refills | Status: DC
  Filled 2023-11-03: qty 30, 30d supply, fill #0

## 2023-11-03 MED ORDER — SENNOSIDES-DOCUSATE SODIUM 8.6-50 MG PO TABS
2.0000 | ORAL_TABLET | Freq: Every day | ORAL | Status: DC
  Administered 2023-11-03: 2 via ORAL
  Filled 2023-11-03: qty 2

## 2023-11-03 MED ORDER — PANTOPRAZOLE SODIUM 40 MG PO TBEC
40.0000 mg | DELAYED_RELEASE_TABLET | Freq: Every day | ORAL | 0 refills | Status: AC
  Filled 2023-11-03: qty 30, 30d supply, fill #0

## 2023-11-03 MED ORDER — CAMPHOR-MENTHOL 0.5-0.5 % EX LOTN
TOPICAL_LOTION | CUTANEOUS | 0 refills | Status: AC | PRN
  Filled 2023-11-03: qty 222, fill #0

## 2023-11-03 MED ORDER — SORBITOL 70 % SOLN
30.0000 mL | Freq: Once | Status: AC
  Administered 2023-11-03: 30 mL via ORAL
  Filled 2023-11-03: qty 30

## 2023-11-03 MED ORDER — CYCLOBENZAPRINE HCL 5 MG PO TABS
5.0000 mg | ORAL_TABLET | Freq: Three times a day (TID) | ORAL | 0 refills | Status: AC | PRN
  Filled 2023-11-03: qty 15, 5d supply, fill #0

## 2023-11-03 MED ORDER — HYDROXYZINE HCL 10 MG PO TABS
10.0000 mg | ORAL_TABLET | Freq: Three times a day (TID) | ORAL | 0 refills | Status: AC | PRN
  Filled 2023-11-03: qty 30, 10d supply, fill #0

## 2023-11-03 MED ORDER — CARVEDILOL 6.25 MG PO TABS
6.2500 mg | ORAL_TABLET | Freq: Two times a day (BID) | ORAL | 0 refills | Status: AC
  Filled 2023-11-03: qty 60, 30d supply, fill #0

## 2023-11-03 MED ORDER — ONDANSETRON HCL 4 MG PO TABS
4.0000 mg | ORAL_TABLET | Freq: Four times a day (QID) | ORAL | 0 refills | Status: AC | PRN
  Filled 2023-11-03: qty 20, 5d supply, fill #0

## 2023-11-03 MED ORDER — BISACODYL 5 MG PO TBEC
5.0000 mg | DELAYED_RELEASE_TABLET | Freq: Every day | ORAL | 0 refills | Status: DC | PRN
  Filled 2023-11-03: qty 30, 30d supply, fill #0

## 2023-11-03 MED ORDER — SENNOSIDES-DOCUSATE SODIUM 8.6-50 MG PO TABS
2.0000 | ORAL_TABLET | Freq: Every day | ORAL | 0 refills | Status: AC
  Filled 2023-11-03: qty 30, 15d supply, fill #0

## 2023-11-03 MED ORDER — ATORVASTATIN CALCIUM 40 MG PO TABS
40.0000 mg | ORAL_TABLET | Freq: Every day | ORAL | 0 refills | Status: AC
  Filled 2023-11-03: qty 30, 30d supply, fill #0

## 2023-11-03 MED ORDER — WITCH HAZEL-GLYCERIN EX PADS
MEDICATED_PAD | CUTANEOUS | 12 refills | Status: AC | PRN
  Filled 2023-11-03: qty 40, 15d supply, fill #0

## 2023-11-03 NOTE — Plan of Care (Signed)
  Problem: RH Balance Goal: LTG Patient will maintain dynamic standing with ADLs (OT) Description: LTG:  Patient will maintain dynamic standing balance with assist during activities of daily living (OT)  Outcome: Completed/Met   Problem: Sit to Stand Goal: LTG:  Patient will perform sit to stand in prep for activites of daily living with assistance level (OT) Description: LTG:  Patient will perform sit to stand in prep for activites of daily living with assistance level (OT) Outcome: Completed/Met   Problem: RH Grooming Goal: LTG Patient will perform grooming w/assist,cues/equip (OT) Description: LTG: Patient will perform grooming with assist, with/without cues using equipment (OT) Outcome: Completed/Met   Problem: RH Bathing Goal: LTG Patient will bathe all body parts with assist levels (OT) Description: LTG: Patient will bathe all body parts with assist levels (OT) Outcome: Completed/Met   Problem: RH Dressing Goal: LTG Patient will perform upper body dressing (OT) Description: LTG Patient will perform upper body dressing with assist, with/without cues (OT). Outcome: Adequate for Discharge Note: Pt needs min A vs goal of supervision due to L inattention and hemiparesis. Goal: LTG Patient will perform lower body dressing w/assist (OT) Description: LTG: Patient will perform lower body dressing with assist, with/without cues in positioning using equipment (OT) Outcome: Adequate for Discharge Note: Pt needs min A vs goal of supervision due to L inattention and hemiparesis.   Problem: RH Toileting Goal: LTG Patient will perform toileting task (3/3 steps) with assistance level (OT) Description: LTG: Patient will perform toileting task (3/3 steps) with assistance level (OT)  Outcome: Adequate for Discharge Note: Pt needs min A vs goal of supervision due to L inattention and hemiparesis.   Problem: RH Functional Use of Upper Extremity Goal: LTG Patient will use RT/LT upper extremity as  a (OT) Description: LTG: Patient will use right/left upper extremity as a stabilizer/gross assist/diminished/nondominant/dominant level with assist, with/without cues during functional activity (OT) Outcome: Completed/Met   Problem: RH Toilet Transfers Goal: LTG Patient will perform toilet transfers w/assist (OT) Description: LTG: Patient will perform toilet transfers with assist, with/without cues using equipment (OT) Outcome: Completed/Met   Problem: RH Tub/Shower Transfers Goal: LTG Patient will perform tub/shower transfers w/assist (OT) Description: LTG: Patient will perform tub/shower transfers with assist, with/without cues using equipment (OT) Outcome: Completed/Met

## 2023-11-03 NOTE — Progress Notes (Signed)
 Occupational Therapy Session Note  Patient Details  Name: Ahren Pettinger MRN: [id number] Date of Birth: Aug 20, 1975  Today's Date: 11/03/2023 OT Individual Time: 1115-1200 OT Individual Time Calculation (min): 45 min    Short Term Goals: Week 1:  OT Short Term Goal 1 (Week 1): Pt will be able to don shirt with min A. OT Short Term Goal 1 - Progress (Week 1): Met OT Short Term Goal 2 (Week 1): Pt will be able to don pants over feet with min A. OT Short Term Goal 2 - Progress (Week 1): Met OT Short Term Goal 3 (Week 1): pt will be able to perform self ROM of LUE. OT Short Term Goal 3 - Progress (Week 1): Met OT Short Term Goal 4 (Week 1): Pt will be able to bathe with min A using long handled sponge. OT Short Term Goal 4 - Progress (Week 1): Met Week 2:  OT Short Term Goal 1 (Week 2): STGs = LTGs  Skilled Therapeutic Interventions/Progress Updates:    Pt received sound asleep in bed, it took a few minutes for him to wake but he eventually did.  Pt sat to EOB and declined need to toilet. Pt agreeable to therapy. -ambulation in hallway with supervision and improved spacing of body to avoid objects or people on his L side -standing at arm bike with mod A to keep L hand on bike pedal -standing at [facility] working on L side visual scanning and Bell cancellation test (missed 8 on lower left quadrant) He began to turn his head to left more to compensate for visual lost when doing visual scanning exercise -seated work for L arm a/arom with rolling ball out and in and holding bball with B hands. Recommended he do that exercise at home.   Pt ambulated back to room with all needs met. Sitting in wc to prepare for lunch.    Therapy Documentation Precautions:  Precautions Precautions: Fall Precaution/Restrictions Comments: L hemipareisis UE>LE, L hemianopsia, cortrak, watch SpO2 and RR Restrictions Weight Bearing Restrictions Per Provider Order: No General: General PT Missed Treatment Reason:  Patient fatigue Pain: Pain Assessment Pain Scale: 0-10 Pain Score: 7  Pain Location: Shoulder Pain Orientation: Left Pain Onset: On-going Pain Intervention(s): Ambulation/increased activity ADL: ADL Eating: Set up Where Assessed-Eating: Chair Grooming: Setup Where Assessed-Grooming: Standing at sink Upper Body Bathing: Supervision/safety Where Assessed-Upper Body Bathing: Shower Lower Body Bathing: Supervision/safety Where Assessed-Lower Body Bathing: Shower Upper Body Dressing: Minimal assistance Where Assessed-Upper Body Dressing: Chair Lower Body Dressing: Minimal assistance Where Assessed-Lower Body Dressing: Chair Toileting: Minimal assistance Where Assessed-Toileting: Teacher, adult education: Close supervision Statistician Method: Proofreader: Engineer, technical sales: Close supervison Web designer Method: Ship broker: [employer], Insurance underwriter: Close supervision Film/video editor Method: Designer, industrial/product: Grab bars, Shower seat with back      Therapy/Group: Individual Therapy  Zuri Bradway 11/03/2023, 1:04 PM

## 2023-11-03 NOTE — Procedures (Signed)
 Modified Barium Swallow Study  Patient Details  Name: Nathan Campbell MRN: [id number] Date of Birth: 09-28-1975  Today's Date: 11/03/2023  Modified Barium Swallow completed.  Full report located under Chart Review in the Imaging Section.  History of Present Illness Pt is a 48 y/o male who presented 10/07/23 with left-sided weakness and slurred speech. MRI brain 7/26: Large right MCA territory infarct with associated diffuse cortical thickening and some mass effect. Pt s/p TNK & IR revascularization. Intubated 7/25-7/29. 7/29 ileus. PMH: CHF, CM, ESRD on HD TTS, HTN, obesity, HLD   Clinical Impression Patient presents with mild oropharyngeal dysphagia. Oral phase is characterized by lingual weakness and L deviation resulting in need for occasional lingual pumping to achieve posterior transit. Patient with mild oral residuals, cleared through additional swallows. Pharyngeal phase is remarkable for delayed swallow initiation resulting in pooling of bolus in the vallecula and/or pyriform sinses before hyoid burst. This is seemingly improved since initial MBS, though still present. Patient with x1 instance of stagnant penetration of thin liquids and occasional transient penetration before/during swallow due to spill of bolus over epiglottis. No other penetration/aspiration present across all consistencies. Recommend initiation of dysphagia 3 diet with thin liquids. Patient would benefit from occaisonal double swallows to clear any oral/pharyngeal residuals. Medications may be taken whole in thin liquids.    DIGEST Swallow Severity Rating*  Safety: 1  Efficiency: 0  Overall Pharyngeal Swallow Severity: mild 1: mild; 2: moderate; 3: severe; 4: profound  *The Dynamic Imaging Grade of Swallowing Toxicity is standardized for the head and neck cancer population, however, demonstrates promising clinical applications across populations to standardize the clinical rating of pharyngeal swallow safety and  severity.  Factors that may increase risk of adverse event in presence of aspiration Noe & Lianne 2021): Reduced cognitive function  Swallow Evaluation Recommendations Recommendations: PO diet PO Diet Recommendation: Dysphagia 3 (Mechanical soft);Thin liquids (Level 0) Liquid Administration via: Cup;Straw Medication Administration: Whole meds with liquid Supervision: Staff to assist with self-feeding Swallowing strategies  : Minimize environmental distractions;Slow rate;Small bites/sips;Clear throat intermittently Postural changes: Position pt fully upright for meals Oral care recommendations: Oral care BID (2x/day)    Joey Hudock M.A., CCC-SLP 11/03/2023,9:33 AM

## 2023-11-03 NOTE — Progress Notes (Signed)
 Physical Therapy Discharge Summary  Patient Details  Name: Nathan Campbell MRN: [id number] Date of Birth: 1976-01-01  Date of Discharge from PT service:November 03, 2023  Patient has met 11 of 11 long term goals due to improved activity tolerance, improved balance, increased strength, functional use of  left lower extremity, improved attention, and improved awareness.  Patient to discharge at an ambulatory level Supervision.   Patient's care partner is independent to provide the necessary physical and cognitive assistance at discharge.  Reasons goals not met: n/a  Recommendation:  Patient will benefit from ongoing skilled PT services in outpatient setting to continue to advance safe functional mobility, address ongoing impairments in strength, coordination, balance, activity tolerance, cognition, safety awareness, and to minimize fall risk.  Equipment: No equipment provided  Reasons for discharge: treatment goals met  Patient/family agrees with progress made and goals achieved: Yes  PT Discharge Precautions/Restrictions Precautions Precautions: Fall Restrictions Weight Bearing Restrictions Per Provider Order: No Pain Pain Assessment Pain Scale: 0-10 Pain Score: 7  Pain Location: Shoulder Pain Orientation: Left Pain Onset: On-going Pain Intervention(s): Ambulation/increased activity Pain Interference Pain Interference Pain Effect on Sleep: 2. Occasionally Pain Interference with Therapy Activities: 1. Rarely or not at all Pain Interference with Day-to-Day Activities: 1. Rarely or not at all Vision/Perception  Vision - History Ability to See in Adequate Light: 1 Impaired Perception Perception: Impaired Preception Impairment Details: Inattention/Neglect Perception-Other Comments: L inattention Praxis Praxis: WFL  Cognition Overall Cognitive Status: Impaired/Different from baseline Arousal/Alertness: Awake/alert Orientation Level: Oriented X4 Year: 2025 Month:  August Day of Week: Correct Attention: Focused;Sustained Focused Attention: Appears intact Sustained Attention: Impaired Sustained Attention Impairment: Verbal basic;Functional basic Memory: Appears intact Memory Impairment: Decreased short term memory Awareness: Impaired Awareness Impairment: Emergent impairment Problem Solving: Impaired Problem Solving Impairment: Verbal complex;Functional complex Safety/Judgment: Impaired Comments: Decreased attention to L side (has improved since evaluation) Sensation Sensation Light Touch: Impaired by gross assessment Hot/Cold: Appears Intact Proprioception: Impaired by gross assessment Coordination Gross Motor Movements are Fluid and Coordinated: Yes Motor  Motor Motor: Other (comment) Motor - Discharge Observations: L LE has improved in functionality and overall control/strength. Greater deficit on UE vs LE.  Mobility Bed Mobility Bed Mobility: Rolling Left;Sit to Supine;Supine to Sit Rolling Right: Independent Rolling Left: Independent Right Sidelying to Sit: Independent Supine to Sit: Independent Sit to Supine: Independent Transfers Transfers: Stand Pivot Transfers;Stand to Sit;Sit to Stand Sit to Stand: Independent Stand to Sit: Independent Stand Pivot Transfers: Independent Transfer (Assistive device): None Locomotion  Gait Ambulation: Yes Gait Assistance: Supervision/Verbal cueing Assistive device: None Gait Gait: Yes Gait Pattern: Impaired Gait Pattern: Decreased weight shift to right;Decreased step length - left;Step-through pattern Stairs / Additional Locomotion Stairs: Yes Stairs Assistance: Supervision/Verbal cueing Stair Management Technique: One rail Right Height of Stairs: 6 Wheelchair Mobility Wheelchair Mobility: No  Trunk/Postural Assessment  Cervical Assessment Cervical Assessment: Within Functional Limits Thoracic Assessment Thoracic Assessment: Within Functional Limits Lumbar Assessment Lumbar  Assessment: Within Functional Limits Postural Control Postural Control: Within Functional Limits  Balance Balance Balance Assessed: Yes Static Sitting Balance Static Sitting - Balance Support: Right upper extremity supported;Feet supported Static Sitting - Level of Assistance: 7: Independent Dynamic Sitting Balance Dynamic Sitting - Balance Support: Right upper extremity supported;Feet supported Dynamic Sitting - Level of Assistance: 6: Modified independent (Device/Increase time) Dynamic Sitting - Balance Activities: Reaching for objects;Reaching across midline;Forward lean/weight shifting Static Standing Balance Static Standing - Balance Support: Right upper extremity supported Static Standing - Level of Assistance: 6: Modified independent (Device/Increase time)  Dynamic Standing Balance Dynamic Standing - Balance Support: Right upper extremity supported Dynamic Standing - Level of Assistance: 5: Stand by assistance (supervision) Extremity Assessment  RLE Assessment RLE Assessment: Within Functional Limits LLE Assessment LLE Assessment: Within Functional Limits   Dominic Sandoval PTA Mliss DELENA Milliner PT, DPT, CSRS 11/03/2023, 7:57 AM

## 2023-11-03 NOTE — Progress Notes (Signed)
  [facility] Progress Note   Subjective:   Seen and examined in his room. Patient endorses some itching and constipation.  Plan for HD 11/03/23  Objective Vitals:   11/02/23 1241 11/02/23 1312 11/02/23 2014 11/03/23 0527  BP: 106/67 112/61 (!) 152/87 (!) 147/91  Pulse: 82 96 89 77  Resp: 18 18 18 18   Temp:  97.8 F (36.6 C) 97.6 F (36.4 C) 98.4 F (36.9 C)  TempSrc:   Oral Oral  SpO2:  93% 98% 94%  Weight:      Height:       Physical Exam General: well appearing, nad  Heart: RRR  Lungs: Normal wob  Abdomen: soft, NTND Extremities: no LE edema Dialysis Access: LU AVF   Filed Weights   10/31/23 0500 10/31/23 1039 11/01/23 2022  Weight: 129 kg 133 kg 131.3 kg    Intake/Output Summary (Last 24 hours) at 11/03/2023 1140 Last data filed at 11/02/2023 1813 Gross per 24 hour  Intake 440 ml  Output --  Net 440 ml    Additional Objective Labs: Basic Metabolic Panel: Recent Labs  Lab 10/29/23 0830 10/29/23 1959 11/01/23 1733  NA 134* 135 133*  K 5.0 3.9 5.4*  CL 91* 92* 88*  CO2 24 28 25   GLUCOSE 113* 92 91  BUN 77* 33* 88*  CREATININE 16.24* 8.47* 18.41*  CALCIUM  9.9 9.5 9.2  PHOS 8.2* 4.1 7.0*   Liver Function Tests: Recent Labs  Lab 10/29/23 0830 10/29/23 1959 11/01/23 1733  ALBUMIN 3.5 3.6 3.4*    CBC: Recent Labs  Lab 10/29/23 1430 11/01/23 1733  WBC 8.9 6.7  HGB 11.2* 10.4*  HCT 34.4* 32.2*  MCV 101.2* 100.0  PLT 307 239   Medications:   aspirin   81 mg Oral Daily   atorvastatin   40 mg Oral Daily   carvedilol   6.25 mg Oral BID   Chlorhexidine  Gluconate Cloth  6 each Topical Q0600   clopidogrel   75 mg Oral Daily   docusate  100 mg Oral BID   lanthanum   1,500 mg Oral TID WC   midodrine   5 mg Oral BID WC   mouth rinse  15 mL Mouth Rinse 4 times per day   pantoprazole   40 mg Oral QHS    Dialysis Orders: TTS - AF 4:15hr, 500/600, EDW 138kg, 2K/2C bath, AVF, heparin  4000+ - Mircera 50mcg given 10/06/23 -  Hectoral 7mcg IV q HD - Takes Auryxia  3/meals and Xphozah 30mg  BID   Assessment/Plan: Acute CVA: R MCA infarct s/p TNK. Now in CIR. AHRF:  Now extubated, on [place] O2.  ESRD: Continue HD on TTS schedule. Next HD 8/21. Hyperkalemia: K 5.4. 2 doses of Lokelma  ordered today until he gets HD.   HTN/volume: BP in goal.  On carvedilol  6.25 mg BID and midodrine  5mg  BID.  Anemia of ESRD: Hgb 10.4, no ESA for now. Secondary HPTH: Corrected Ca in goal. Phos 7 and he is on his home binders. VDRA on hold d/t hypercalcemia. Was changed to Fosrenol  here.   Nutrition: Dysphagia diet.  Constipation: per primary rehab team  Belvie Och, NP [facility] 11/03/2023,11:40 AM

## 2023-11-03 NOTE — Progress Notes (Signed)
 Speech Language Pathology Discharge Summary  Patient Details  Name: Nathan Campbell MRN: [id number] Date of Birth: November 20, 1975  Date of Discharge from SLP service:November 03, 2023   Patient has met 6 of 6 long term goals.  Patient to discharge at overall Supervision;Min level.  Reasons goals not met: n/a   Clinical Impression/Discharge Summary: Pt has made great gains and has met 6 of 6 LTG's this admission due to improved communication, dysphagia and cognition. Pt is currently an overall minA for cognitive tasks and requires supervision cues for utilization of swallowing compensatory strategies to minimize overt s/sx of aspiration with D3/thin diet. Patient is now reaching up to 80% intelligibility at the phrase level with use of compensatory strategies. Pt/family education complete and pt will discharge home with supervision from friends/family/etc. Pt would benefit from [facility] f/u ST services to maximize cognition, communication and dysphagia in order to maximize functional independence.   Care Partner:  Caregiver Able to Provide Assistance: Yes  Type of Caregiver Assistance: Physical;Cognitive  Recommendation:  Home Health SLP  Rationale for SLP Follow Up: Maximize cognitive function and independence;Maximize swallowing safety;Maximize functional communication   Equipment: n/a   Reasons for discharge: Treatment goals met;Discharged from hospital   Patient/Family Agrees with Progress Made and Goals Achieved: Yes    [name] M.A., CCC-SLP 11/03/2023, 7:48 AM

## 2023-11-03 NOTE — Plan of Care (Signed)
  Problem: RH Swallowing Goal: LTG Patient will consume least restrictive diet using compensatory strategies with assistance (SLP) Description: LTG:  Patient will consume least restrictive diet using compensatory strategies with assistance (SLP) Outcome: Completed/Met   Problem: RH Expression Communication Goal: LTG Patient will increase speech intelligibility (SLP) Description: LTG: Patient will increase speech intelligibility at word/phrase/conversation level with cues, % of the time (SLP) Outcome: Completed/Met   Problem: RH Problem Solving Goal: LTG Patient will demonstrate problem solving for (SLP) Description: LTG:  Patient will demonstrate problem solving for basic/complex daily situations with cues  (SLP) Outcome: Completed/Met   Problem: RH Memory Goal: LTG Patient will use memory compensatory aids to (SLP) Description: LTG:  Patient will use memory compensatory aids to recall biographical/new, daily complex information with cues (SLP) Outcome: Completed/Met   Problem: RH Attention Goal: LTG Patient will demonstrate this level of attention during functional activites (SLP) Description: LTG:  Patient will will demonstrate this level of attention during functional activites (SLP) Outcome: Completed/Met   Problem: RH Awareness Goal: LTG: Patient will demonstrate awareness during functional activites type of (SLP) Description: LTG: Patient will demonstrate awareness during functional activites type of (SLP) Outcome: Completed/Met

## 2023-11-03 NOTE — Progress Notes (Signed)
 PROGRESS NOTE   Subjective/Complaints: Pt working with OT. No new issues today. Still feeling constipated. Itching on going. No dizziness except when he has to strain on toilet to try to empty.   ROS: Patient denies fever, rash, sore throat, blurred vision, dizziness, nausea, vomiting, diarrhea, cough, shortness of breath or chest pain, joint or back/neck pain, headache, or mood change.    Objective:   DG Swallowing Func-Speech Pathology Result Date: 11/03/2023 Table formatting from the original result was not included. Modified Barium Swallow Study Patient Details Name: Nathan Campbell MRN: [id number] Date of Birth: 1975-07-08 Today's Date: 11/03/2023 HPI/PMH: HPI: Pt is a 48 y/o male who presented 10/07/23 with left-sided weakness and slurred speech. MRI brain 7/26: Large right MCA territory infarct with associated diffuse cortical thickening and some mass effect. Pt s/p TNK & IR revascularization. Intubated 7/25-7/29. 7/29 ileus. PMH: CHF, CM, ESRD on HD TTS, HTN, obesity, HLD Clinical Impression: Patient presents with mild oropharyngeal dysphagia. Oral phase is characterized by lingual weakness and deviation resulting in need for occasional lingual pumping to achieve posterior transit. Patient with mild oral residuals, cleared through additional swallows. Pharyngeal phase is remarkable for delayed swallow initiation resulting in pooling of bolus in the vallecula and/or pyriform sinses before hyoid burst. This is seemingly improved since initial MBS, though still present. Patient with x1 instance of stagnant penetration of thin liquids and occasional transient penetration before/during swallow due to spill of bolus over epiglottis. No other penetration/aspiration present across all consistencies. Recommend initiation of dysphagia 3 diet with thin liquids. Patient would benefit from occaisonal double swallows to clear any oral/pharyngeal residuals.  Medications may be taken whole in thin liquids. DIGEST Swallow Severity Rating*  Safety: 1  Efficiency: 0  Overall Pharyngeal Swallow Severity: mild 1: mild; 2: moderate; 3: severe; 4: profound *The Dynamic Imaging Grade of Swallowing Toxicity is standardized for the head and neck cancer population, however, demonstrates promising clinical applications across populations to standardize the clinical rating of pharyngeal swallow safety and severity. Factors that may increase risk of adverse event in presence of aspiration Noe & Lianne 2021): Factors that may increase risk of adverse event in presence of aspiration Noe & Lianne 2021): Reduced cognitive function Recommendations/Plan: Swallowing Evaluation Recommendations Swallowing Evaluation Recommendations Recommendations: PO diet PO Diet Recommendation: Dysphagia 3 (Mechanical soft); Thin liquids (Level 0) Liquid Administration via: Cup; Straw Medication Administration: Whole meds with liquid Supervision: Staff to assist with self-feeding Swallowing strategies  : Minimize environmental distractions; Slow rate; Small bites/sips; Clear throat intermittently Postural changes: Position pt fully upright for meals Oral care recommendations: Oral care BID (2x/day) Treatment Plan Treatment Plan Treatment recommendations: Therapy as outlined in treatment plan below Follow-up recommendations: Home health SLP Functional status assessment: Patient has had a recent decline in their functional status and demonstrates the ability to make significant improvements in function in a reasonable and predictable amount of time. Treatment frequency: Min 2x/week Treatment duration: 3 weeks Interventions: Patient/family education; Diet toleration management by SLP; Aspiration precaution training; Compensatory techniques Recommendations Recommendations for follow up therapy are one component of a multi-disciplinary discharge planning process, led by the attending physician.   Recommendations may be updated based on patient  status, additional functional criteria and insurance authorization. Assessment: Orofacial Exam: Orofacial Exam Oral Cavity: Oral Hygiene: Pooled secretions Oral Cavity - Dentition: Adequate natural dentition Orofacial Anatomy: WFL Oral Motor/Sensory Function: Suspected cranial nerve impairment CN VII - Facial: Left motor impairment CN XII - Hypoglossal: Left motor impairment Anatomy: Anatomy: WFL Boluses Administered: Boluses Administered Boluses Administered: Thin liquids (Level 0); Puree; Solid  Oral Impairment Domain: Oral Impairment Domain Lip Closure: No labial escape Tongue control during bolus hold: Cohesive bolus between tongue to palatal seal Bolus preparation/mastication: Slow prolonged chewing/mashing with complete recollection Bolus transport/lingual motion: Slow tongue motion; Repetitive/disorganized tongue motion Oral residue: Residue collection on oral structures Location of oral residue : Palate; Tongue Initiation of pharyngeal swallow : Pyriform sinuses  Pharyngeal Impairment Domain: Pharyngeal Impairment Domain Soft palate elevation: No bolus between soft palate (SP)/pharyngeal wall (PW) Laryngeal elevation: Complete superior movement of thyroid cartilage with complete approximation of arytenoids to epiglottic petiole Anterior hyoid excursion: Complete anterior movement Epiglottic movement: Complete inversion Laryngeal vestibule closure: Complete, no air/contrast in laryngeal vestibule Pharyngeal stripping wave : Present - complete Pharyngoesophageal segment opening: Complete distension and complete duration, no obstruction of flow Tongue base retraction: No contrast between tongue base and posterior pharyngeal wall (PPW) Pharyngeal residue: Complete pharyngeal clearance  Esophageal Impairment Domain: Esophageal Impairment Domain Esophageal clearance upright position: Complete clearance, esophageal coating Pill: Pill Consistency administered: Thin  liquids (Level 0) Thin liquids (Level 0): [facility] Penetration/Aspiration Scale Score: Penetration/Aspiration Scale Score 1.  Material does not enter airway: Puree; Solid; Pill 3.  Material enters airway, remains ABOVE vocal cords and not ejected out: Thin liquids (Level 0) Compensatory Strategies: Compensatory Strategies Compensatory strategies: Yes Multiple swallows: Effective Effective Multiple Swallows: Thin liquid (Level 0)   General Information: Caregiver present: No  Diet Prior to this Study: Dysphagia 3 (mechanical soft); Mildly thick liquids (Level 2, nectar thick)   No data recorded  No data recorded  Supplemental O2: None (Room air)   No data recorded Behavior/Cognition: Alert; Cooperative Self-Feeding Abilities: Needs set-up for self-feeding No data recorded Volitional Cough: Able to elicit Volitional Swallow: Able to elicit Exam Limitations: No limitations Goal Planning: Prognosis for improved oropharyngeal function: Good No data recorded No data recorded Patient/Family Stated Goal: thin liquids Consulted and agree with results and recommendations: Patient; Nurse Pain: No data recorded End of Session: Start Time:SLP Start Time (ACUTE ONLY): 0900 Stop Time: SLP Stop Time (ACUTE ONLY): 0915 Time Calculation:SLP Time Calculation (min) (ACUTE ONLY): 15 min Charges: SLP Evaluations $ SLP Speech Visit: 1 Visit SLP Evaluations $MBS Swallow: 1 Procedure SLP visit diagnosis: SLP Visit Diagnosis: Dysphagia, oropharyngeal phase (R13.12) Past Medical History: Past Medical History: Diagnosis Date  Acute combined systolic and diastolic CHF, NYHA class 4 (HCC) 06/10/2016  Anemia   Cardiomyopathy (HCC) 06/14/2016  CHF (congestive heart failure) (HCC)   COVID 10/2020  Dyspnea   ESRD on hemodialysis (HCC)   TTS at [facility]  Hypertension   Hypertensive heart and kidney disease with heart failure (HCC) 06/14/2016  Hypertensive heart disease with congestive heart failure (HCC)   Obesity  Past Surgical History: Past Surgical  History: Procedure Laterality Date  A/V FISTULAGRAM Left 05/03/2022  Procedure: A/V Fistulagram;  Surgeon: Eliza Lonni RAMAN, MD;  Location: [facility] INVASIVE CV LAB;  Service: Cardiovascular;  Laterality: Left;  AV FISTULA PLACEMENT Left 11/11/2020  Procedure: LEFT ARM First Stage Basilic Vein Fistula Creation;  Surgeon: Sheree Penne Lonni, MD;  Location: [facility] OR;  Service: Vascular;  Laterality: Left;  BASCILIC VEIN TRANSPOSITION  Left 01/23/2021  Procedure: Second Stage Basilic Vein Transposition of Left Arm Arteriovenous  Fistula;  Surgeon: Sheree Penne Bruckner, MD;  Location: [facility] OR;  Service: Vascular;  Laterality: Left;  Block  to LMA   COLONOSCOPY    FISTULA SUPERFICIALIZATION Left 05/07/2022  Procedure: PLICATION OF ANEURYSM, LEFT ARM FISTULA;  Surgeon: Eliza Bruckner RAMAN, MD;  Location: [facility] OR;  Service: Vascular;  Laterality: Left;  IR CT HEAD LTD  10/07/2023  IR CT HEAD LTD  10/07/2023  IR PATIENT EVAL TECH 0-60 MINS  10/07/2023  IR PERCUTANEOUS ART THROMBECTOMY/INFUSION INTRACRANIAL INC DIAG ANGIO  10/07/2023  NO PAST SURGERIES    PERIPHERAL VASCULAR BALLOON ANGIOPLASTY Left 05/03/2022  Procedure: PERIPHERAL VASCULAR BALLOON ANGIOPLASTY;  Surgeon: Eliza Bruckner RAMAN, MD;  Location: [facility] INVASIVE CV LAB;  Service: Cardiovascular;  Laterality: Left;  AVF  RADIOLOGY WITH ANESTHESIA N/A 10/07/2023  Procedure: RADIOLOGY WITH ANESTHESIA;  Surgeon: Radiologist, Medication, MD;  Location: [facility] OR;  Service: Radiology;  Laterality: N/A; Blaise FALCON Sockwell 11/03/2023, 9:36 AM   Recent Labs    11/01/23 1733  WBC 6.7  HGB 10.4*  HCT 32.2*  PLT 239    Recent Labs    11/01/23 1733  NA 133*  K 5.4*  CL 88*  CO2 25  GLUCOSE 91  BUN 88*  CREATININE 18.41*  CALCIUM  9.2     Intake/Output Summary (Last 24 hours) at 11/03/2023 1022 Last data filed at 11/02/2023 1813 Gross per 24 hour  Intake 440 ml  Output --  Net 440 ml        Physical Exam: Vital Signs Blood pressure (!) 147/91, pulse  77, temperature 98.4 F (36.9 C), temperature source Oral, resp. rate 18, height 6' (1.829 m), weight 131.3 kg, SpO2 94%.   Constitutional: No distress . Vital signs reviewed. HEENT: NCAT, EOMI, oral membranes moist Neck: supple Cardiovascular: RRR without murmur. No JVD    Respiratory/Chest: CTA Bilaterally without wheezes or rales. Normal effort    GI/Abdomen: BS +, non-tender, non-distended Ext: no clubbing, cyanosis, or edema Psych: pleasant and cooperative  Skin: No evidence of breakdown, no evidence of rash Neurologic:alert, mild left VII,  Speech a little dysarthric.  motor strength is 5/5 in right deltoid, bicep, tricep, grip, hip flexor, knee extensors, ankle dorsiflexor and plantar flexor 2+ to 3-/5 in LUE at delt and biceps and grip, 1+ to 2-/5 L finger and arm extensors---stable in appearance , 4-/5 in LLE Sensory exam normal sensation to light touch  in bilateral lower extremities Absent to minimal LT LUE remains--didn't realize he had food on wrist this am Musculoskeletal: pain free  range of motion in all 4 extremities. No joint swelling Prior neuro assessment is c/w 11/03/2023 exam.    Assessment/Plan: 1. Functional deficits which require 3+ hours per day of interdisciplinary therapy in a comprehensive inpatient rehab setting. Physiatrist is providing close team supervision and 24 hour management of active medical problems listed below. Physiatrist and rehab team continue to assess barriers to discharge/monitor patient progress toward functional and medical goals  Care Tool:  Bathing    Body parts bathed by patient: Chest, Abdomen, Front perineal area, Right upper leg, Left upper leg, Face, Left arm, Buttocks, Right lower leg, Left lower leg, Right arm   Body parts bathed by helper: Right arm     Bathing assist Assist Level: Supervision/Verbal cueing     Upper Body Dressing/Undressing Upper body dressing   What is the patient wearing?: Pull over shirt    Upper  body assist Assist Level: Minimal Assistance - Patient > 75%    Lower Body Dressing/Undressing Lower body dressing      What is the patient wearing?: Pants     Lower body assist Assist for lower body dressing: Minimal Assistance - Patient > 75%     Toileting Toileting Toileting Activity did not occur (Clothing management and hygiene only): N/A (no void or bm) (dialysis patient)  Toileting assist Assist for toileting: Minimal Assistance - Patient > 75%     Transfers Chair/bed transfer  Transfers assist     Chair/bed transfer assist level: Supervision/Verbal cueing     Locomotion Ambulation   Ambulation assist      Assist level: Contact Guard/Touching assist Assistive device: No Device Max distance: 150'   Walk 10 feet activity   Assist     Assist level: Contact Guard/Touching assist Assistive device: No Device   Walk 50 feet activity   Assist Walk 50 feet with 2 turns activity did not occur: Safety/medical concerns  Assist level: Contact Guard/Touching assist Assistive device: No Device    Walk 150 feet activity   Assist Walk 150 feet activity did not occur: Safety/medical concerns  Assist level: Contact Guard/Touching assist Assistive device: No Device    Walk 10 feet on uneven surface  activity   Assist Walk 10 feet on uneven surfaces activity did not occur: Safety/medical concerns         Wheelchair     Assist Is the patient using a wheelchair?: Yes Type of Wheelchair: Manual    Wheelchair assist level: Total Assistance - Patient < 25% Max wheelchair distance: 220'    Wheelchair 50 feet with 2 turns activity    Assist        Assist Level: Dependent - Patient 0%   Wheelchair 150 feet activity     Assist      Assist Level: Dependent - Patient 0%   Blood pressure (!) 147/91, pulse 77, temperature 98.4 F (36.9 C), temperature source Oral, resp. rate 18, height 6' (1.829 m), weight 131.3 kg, SpO2  94%.  Medical Problem List and Plan: 1. Functional deficits secondary to acute R MCA CVA with right M1 occlusion s/p TNK 7/25 and IR with TICI2b likely large vessel disease.             -patient may shower             -ELOS/Goals: 11/04/23   supervision            -finalizing dc planning    2.  Antithrombotics: -DVT/anticoagulation:  Pharmaceutical: Heparin  will d/c since ambulation is >43' with PT             -antiplatelet therapy: continue Aspirin  and Plavix  x 3 months then Aspirin  alone.   3. Pain Management: Tylenol  and Flexeril  (back pain) as needed, kpad ordered  4. Daytime somnolence: d/c hydroxyzine   5. Neuropsych/cognition: This patient is not capable of making decisions on his own behalf.  6. Pruritus without rash , likely due to renal disease: continue lotion   -resumed atarax  for itching  -hopefully he'll see some improvement with time. Not sure what else to offer him 7. Fluids/Electrolytes/Nutrition/dysphagia: Monitor I/O and Daily weights.              - TF + protein supplements.     8/21 continue to push po. Should be better that he has been upgraded to thins along with D3 after his MBS!   8. ESRD on hemodialysis:  BUN 139, Cr 18.98 HD on TTHS schedule- --Labs to be collected in HD             -Hyperphos/Hyperkalemia - per Nephro 9. Chronic Anemia of ESRD: hgb 10.9 from 11.6 continue to monitor  10. Acute hypoxic/hypercapnic respiratory failure: On 2 L Diagonal CXR Vague lingular and left lower lobe airspace opacification    Suspect underlying OSA   11. Leukocytosis:     Latest Ref Rng & Units 11/01/2023    5:33 PM 10/29/2023    2:30 PM 10/24/2023    5:53 AM  CBC  WBC 4.0 - 10.5 K/uL 6.7  8.9  10.9   Hemoglobin 13.0 - 17.0 g/dL 89.5  88.7  88.6   Hematocrit 39.0 - 52.0 % 32.2  34.4  34.7   Platelets 150 - 400 K/uL 239  307  363    8/20 remains afebrile 12. Combined systolic and diastolic H: home meds amlodipine , isosorbide  and carvedilol .  Fluid management per  nephrology.             -appears to be euvolemic   13. Secondary HPTH: Home auryxia  cannot be given via NG tube--continue Fosrenol    14. HLD: LDL 85 continue Atorvastatin  40 mg   15. Obesity: BMI 39.77 educate on diet and weight loss to promote overall health and mobility.   16.  Hypertension/hypotension: Long-term goal normotensive.    Vitals:   11/02/23 2014 11/03/23 0527  BP: (!) 152/87 (!) 147/91  Pulse: 89 77  Resp: 18 18  Temp: 97.6 F (36.4 C) 98.4 F (36.9 C)  SpO2: 98% 94%   Increase coreg  to 12.5 still hypertensive with elevated HR -8/13 d/c Norvasc  d/t orthostatic hypotension Also hold isordil  , cont coreg  due to tachy 8/21 bp's better with midodrine  and adjustments with HD 17.  Hyperlipidemia: continue statin  18. Slow transit constipation  -add senokot-s 2 tabs at bedtime  -8/21 --still feels constipated--->sorbitol  + SSE today        LOS: 16 days A FACE TO FACE EVALUATION WAS PERFORMED  Arthea ONEIDA Gunther 11/03/2023, 10:22 AM

## 2023-11-03 NOTE — Progress Notes (Signed)
 Occupational Therapy Discharge Summary  Patient Details  Name: Nathan Campbell MRN: [id number] Date of Birth: 1975-05-07  Date of Discharge from OT service:November 03, 2023     Patient has met 7 of 10 long term goals due to improved activity tolerance, improved balance, postural control, ability to compensate for deficits, functional use of  LEFT upper and LEFT lower extremity, improved attention, improved awareness, and improved coordination.  Patient to discharge at overall Supervision to MIn A level.  Patient's care partner is independent to provide the necessary physical and cognitive assistance at discharge.  His mother attended family education   Reasons goals not met: Pt did not meet supervision goals for toileting and dressing - Pt needs min A vs goal of supervision due to L inattention and hemiparesis.  Recommendation:  Patient will benefit from ongoing skilled OT services in home health setting to continue to advance functional skills in the area of BADL.  Equipment: No equipment provided  Reasons for discharge: treatment goals met  Patient/family agrees with progress made and goals achieved: Yes  OT Discharge Precautions/Restrictions   fall    ADL ADL Eating: Set up Where Assessed-Eating: Chair Grooming: Setup Where Assessed-Grooming: Standing at sink Upper Body Bathing: Supervision/safety Where Assessed-Upper Body Bathing: Shower Lower Body Bathing: Supervision/safety Where Assessed-Lower Body Bathing: Shower Upper Body Dressing: Minimal assistance Where Assessed-Upper Body Dressing: Chair Lower Body Dressing: Minimal assistance Where Assessed-Lower Body Dressing: Chair Toileting: Minimal assistance Where Assessed-Toileting: Teacher, adult education: Close supervision Toilet Transfer Method: Proofreader: Engineer, technical sales: Close supervison Web designer Method: Ship broker: [employer], Licensed conveyancer: Close supervision Film/video editor Method: Designer, industrial/product: Grab bars, Information systems manager with back Vision Baseline Vision/History: 0 No visual deficits Patient Visual Report: No change from baseline Perception  Perception: Impaired Perception-Other Comments: L inattention Praxis Praxis: WFL Cognition Cognition Overall Cognitive Status: Impaired/Different from baseline Arousal/Alertness: Awake/alert Memory: Appears intact Memory Impairment: Decreased short term memory Attention: Focused;Sustained Focused Attention: Appears intact Sustained Attention: Impaired Sustained Attention Impairment: Verbal basic;Functional basic Awareness: Impaired Awareness Impairment: Emergent impairment Problem Solving: Impaired Problem Solving Impairment: Verbal complex;Functional complex Safety/Judgment: Impaired Comments: Decreased attention to L side (has improved since evaluation) Brief Interview for Mental Status (BIMS) Repetition of Three Words (First Attempt): 3 Temporal Orientation: Year: Correct Temporal Orientation: Month: Accurate within 5 days Temporal Orientation: Day: Correct Recall: Sock: Yes, after cueing (something to wear) Recall: Blue: Yes, no cue required Recall: Bed: Yes, no cue required BIMS Summary Score: 14 Sensation Sensation Light Touch: Impaired Detail Peripheral sensation comments: pt reports tingling in L hand Light Touch Impaired Details: Impaired LUE Hot/Cold: Appears Intact Proprioception: Impaired by gross assessment Stereognosis: Not tested Coordination Gross Motor Movements are Fluid and Coordinated: No Fine Motor Movements are Fluid and Coordinated: No Coordination and Movement Description: L hemiplegia Motor  Motor Motor: Other (comment) Motor - Discharge Observations: L LE has improved in functionality and overall control/strength. Greater deficit on UE vs LE. Mobility  Bed  Mobility Bed Mobility: Rolling Left;Sit to Supine;Supine to Sit Rolling Right: Independent Rolling Left: Independent Right Sidelying to Sit: Independent Supine to Sit: Independent Sit to Supine: Independent Transfers Sit to Stand: Independent Stand to Sit: Independent  Trunk/Postural Assessment  Cervical Assessment Cervical Assessment: Within Functional Limits Thoracic Assessment Thoracic Assessment: Within Functional Limits Lumbar Assessment Lumbar Assessment: Within Functional Limits Postural Control Postural Control: Within Functional Limits  Balance Balance Balance Assessed: Yes Static Sitting Balance Static  Sitting - Balance Support: Right upper extremity supported;Feet supported Static Sitting - Level of Assistance: 7: Independent Dynamic Sitting Balance Dynamic Sitting - Balance Support: Right upper extremity supported;Feet supported Dynamic Sitting - Level of Assistance: 6: Modified independent (Device/Increase time) Dynamic Sitting - Balance Activities: Reaching for objects;Reaching across midline;Forward lean/weight shifting Static Standing Balance Static Standing - Balance Support: Right upper extremity supported Static Standing - Level of Assistance: 6: Modified independent (Device/Increase time) Dynamic Standing Balance Dynamic Standing - Balance Support: Right upper extremity supported Dynamic Standing - Level of Assistance: 5: Stand by assistance (supervision) Extremity/Trunk Assessment RUE Assessment RUE Assessment: Within Functional Limits LUE Assessment LUE Assessment: Exceptions to [facility] Active Range of Motion (AROM) Comments: ~ 110* of active shoulder flexion, full elbow, wrist and hand ROM General Strength Comments: grossly 2/5, unable take resistance at this time LUE Body System: Neuro Brunstrum levels for arm and hand: Arm;Hand Brunstrum level for arm: Stage V Relative Independence from Synergy Brunstrum level for hand: Stage V Independence from basic  synergies   Nathan Campbell 11/03/2023, 8:01 AM

## 2023-11-03 NOTE — Progress Notes (Signed)
 Physical Therapy Session Note  Patient Details  Name: Nathan Campbell MRN: [id number] Date of Birth: 07/15/1975  Today's Date: 11/03/2023 PT Individual Time: 1007-1100 PT Individual Time Calculation (min): 53 min   Short Term Goals: Week 2:  PT Short Term Goal 1 (Week 2): STG = LTG due to ELOS  Skilled Therapeutic Interventions/Progress Updates: Patient sitting in WC on entrance to room. Patient alert and agreeable to PT session.   Patient reported 7/10 pain in L shoulder (manual therapy and passive ROM provided with pt reported 2/10 following). PTA further discussed pt's L sided awareness and to increase when ambulating for safety (pt continues to require VC to bring awareness to L side to avoid almost grazing objects on wall).   Therapeutic Activity: Bed Mobility: Pt performed sit<supine from EOB independently, as well as scooting to [facility] (use of railing). Transfers: Pt performed sit<>stand transfers throughout session independently with no AD. Pt has improved ability to safely control descent vs at evaluation.  - Pt ambulated back to room during session from main gym to attempt BM but was unable to produce. Pt donned personal shorts with minA for time management and ambulated back to main gym with same cuing provided under gait training.  - Pt participated in floor transfer with overall CGA for safety and pt educated on ensuring no injury occurred prior to attempting transfer. Pt cued to roll to R side due to decreased coordination/strength on L UE. Pt cued to advance self onto R forearm and hand to assist with truncal elevation and onto knees using hi/low mat. Pt able to transfer to kneeling position and standing without assistance.   Gait Training:  Pt ambulated from room<>main gym and throughout session using no AD with supervison. Pt demonstrated the following gait deviations with therapist providing the described cuing and facilitation for improvement:  - Decreased weight shift to R  with VC to increase in order to improve step length/clearance on L LE - Decreased reciprocal arm swing with VC to increase  - Pt attempted to navigate 6 steps, but reported increase in fatigue and required extended rest break (stair navigation deferred this session due to fatigue and pt safety).   Therapeutic Exercise: Pt performed the following exercises with therapist providing the described cuing and facilitation for improvement. - Passive ROM to L UE following manual therapy from flexion to extension  Manual Therapy: Palpation of L upper trap musculature performed with trigger points noted. Education and rationale provided with pt agreeing to participate in intervention. - Trigger point release to stated area with soft tissue mobilization to follow throughout.  Patient supine in bed at end of session with brakes locked, and all needs within reach.      Therapy Documentation Precautions:  Precautions Precautions: Fall Precaution/Restrictions Comments: L hemipareisis UE>LE, L hemianopsia, cortrak, watch SpO2 and RR Restrictions Weight Bearing Restrictions Per Provider Order: No  Therapy/Group: Individual Therapy  Cowen Pesqueira PTA 11/03/2023, 12:08 PM

## 2023-11-03 NOTE — Progress Notes (Signed)
 Occupational Therapy Session Note  Patient Details  Name: Nathan Campbell MRN: [id number] Date of Birth: 19-Aug-1975  Today's Date: 11/03/2023 OT Individual Time: 9260-9164 OT Individual Time Calculation (min): 56 min    Short Term Goals: Week 2:  OT Short Term Goal 1 (Week 2): STGs = LTGs  Skilled Therapeutic Interventions/Progress Updates:  Pt greeted asleep in supine, increaesd time and effort to awake pt with max stimulus, pt agreeable to OT intervention.      Transfers/bed mobility/functional mobility:  Pt completed all functional ambulation with no AD and supervision   BP from sitting- 143/88( 103) HR 108    ADLs:  Grooming: pt stood at sink for oral care and face washing with set- up assist, reinforced education on hemi strategies fro grooming.   Transfers: ambulatory ADL transfers with no AD and supervision  Toileting: pt completed 3/3 toileting tasks with supervision, continent urine void.  Self feeding: assisted pt with self feeding with set- up assist to open canisters and donning condiments.     Exercises: reviewed HEP for home including wall slides, active ROM sequence (I.e lay arms on table, supinate wrists, flex digits, curls elbow, extend elbows and repeat) and grasping and releasing small items such as blocks and placing them in cup to facilitate improved targeted reach for ADL participation.  Assessments: completed below DC requirements as indicated below during session:  ADL ADL Eating: Set up Where Assessed-Eating: Chair Grooming: Setup Where Assessed-Grooming: Standing at sink Upper Body Bathing: Supervision/safety Where Assessed-Upper Body Bathing: Shower Lower Body Bathing: Supervision/safety Where Assessed-Lower Body Bathing: Shower Upper Body Dressing: Minimal assistance Where Assessed-Upper Body Dressing: Chair Lower Body Dressing: Minimal assistance Where Assessed-Lower Body Dressing: Chair Toileting: Minimal assistance Where  Assessed-Toileting: Teacher, adult education: Close supervision Toilet Transfer Method: Proofreader: Engineer, technical sales: Close supervison Web designer Method: Ship broker: [employer], Insurance underwriter: Close supervision Film/video editor Method: Designer, industrial/product: Grab bars, Information systems manager with back Vision Baseline Vision/History: 0 No visual deficits Patient Visual Report: No change from baseline Perception  Perception: Impaired Perception-Other Comments: L inattention Praxis Praxis: WFL Cognition Cognition Overall Cognitive Status: Impaired/Different from baseline Arousal/Alertness: Awake/alert Memory: Appears intact Memory Impairment: Decreased short term memory Attention: Focused;Sustained Focused Attention: Appears intact Sustained Attention: Impaired Sustained Attention Impairment: Verbal basic;Functional basic Awareness: Impaired Awareness Impairment: Emergent impairment Problem Solving: Impaired Problem Solving Impairment: Verbal complex;Functional complex Safety/Judgment: Impaired Comments: Decreased attention to L side (has improved since evaluation) Brief Interview for Mental Status (BIMS) Repetition of Three Words (First Attempt): 3 Temporal Orientation: Year: Correct Temporal Orientation: Month: Accurate within 5 days Temporal Orientation: Day: Correct Recall: Sock: Yes, after cueing (something to wear) Recall: Blue: Yes, no cue required Recall: Bed: Yes, no cue required BIMS Summary Score: 14 Sensation Sensation Light Touch: Impaired Detail Peripheral sensation comments: pt reports tingling in L hand Light Touch Impaired Details: Impaired LUE Hot/Cold: Appears Intact Proprioception: Impaired by gross assessment Stereognosis: Not tested Coordination Gross Motor Movements are Fluid and Coordinated: No Fine Motor Movements are Fluid and  Coordinated: No Coordination and Movement Description: L hemiplegia Motor  Motor Motor: Other (comment) Motor - Discharge Observations: L LE has improved in functionality and overall control/strength. Greater deficit on UE vs LE. Mobility  Bed Mobility Bed Mobility: Rolling Left;Sit to Supine;Supine to Sit Rolling Right: Independent Rolling Left: Independent Right Sidelying to Sit: Independent Supine to Sit: Independent Sit to Supine: Independent Transfers Sit to Stand: Independent Stand  to Sit: Independent  Trunk/Postural Assessment  Cervical Assessment Cervical Assessment: Within Functional Limits Thoracic Assessment Thoracic Assessment: Within Functional Limits Lumbar Assessment Lumbar Assessment: Within Functional Limits Postural Control Postural Control: Within Functional Limits  Balance Balance Balance Assessed: Yes Static Sitting Balance Static Sitting - Balance Support: Right upper extremity supported;Feet supported Static Sitting - Level of Assistance: 7: Independent Dynamic Sitting Balance Dynamic Sitting - Balance Support: Right upper extremity supported;Feet supported Dynamic Sitting - Level of Assistance: 6: Modified independent (Device/Increase time) Dynamic Sitting - Balance Activities: Reaching for objects;Reaching across midline;Forward lean/weight shifting Static Standing Balance Static Standing - Balance Support: Right upper extremity supported Static Standing - Level of Assistance: 6: Modified independent (Device/Increase time) Dynamic Standing Balance Dynamic Standing - Balance Support: Right upper extremity supported Dynamic Standing - Level of Assistance: 5: Stand by assistance (supervision) Extremity/Trunk Assessment RUE Assessment RUE Assessment: Within Functional Limits LUE Assessment LUE Assessment: Exceptions to [facility] Active Range of Motion (AROM) Comments: ~ 110* of active shoulder flexion, full elbow, wrist and hand ROM General Strength  Comments: grossly 2/5, unable take resistance at this time LUE Body System: Neuro Brunstrum levels for arm and hand: Arm;Hand Brunstrum level for arm: Stage V Relative Independence from Synergy Brunstrum level for hand: Stage V Independence from basic synergies                 Ended session with pt handed off directly to transport for MBS.              Therapy Documentation Precautions:  Precautions Precautions: Fall Precaution/Restrictions Comments: L hemipareisis UE>LE, L hemianopsia, cortrak, watch SpO2 and RR Restrictions Weight Bearing Restrictions Per Provider Order: No  Pain: no pain reported     Therapy/Group: Individual Therapy  Ronal Gift [facility] 11/03/2023, 12:07 PM

## 2023-11-03 NOTE — Discharge Instructions (Addendum)
 Inpatient Rehab Discharge Instructions  [name] Discharge date and time: 11/04/23   Activities/Precautions/ Functional Status: Activity: no lifting, driving, or strenuous exercise for until cleared by MD  Diet: renal diet Wound Care: none needed Functional status:  ___ No restrictions     ___ Walk up steps independently ___ 24/7 supervision/assistance   ___ Walk up steps with assistance ___ Intermittent supervision/assistance  ___ Bathe/dress independently ___ Walk with walker     ___ Bathe/dress with assistance ___ Walk Independently    ___ Shower independently ___ Walk with assistance    __X_ Shower with assistance __x_ No alcohol     ___ Return to work/school ________  Special Instructions:  -Referral has been placed for sleep study. Please call office to schedule if they have not contacted you.   -Discuss meds with PCP before resuming   COMMUNITY REFERRALS UPON DISCHARGE:     Outpatient: PT      OT     ST              Agency: [facility]  Phone:  [phone]             Appointment Date/Time: *Please expect follow-up within 2-3 business days for discharge to schedule your home visit. If you have not received follow-up, be sure to contact the site directly.*     My questions have been answered and I understand these instructions. I will adhere to these goals and the provided educational materials after my discharge from the hospital.  Patient/Caregiver Signature _______________________________ Date __________  Clinician Signature _______________________________________ Date __________  Please bring this form and your medication list with you to all your follow-up doctor's appointments.

## 2023-11-03 NOTE — Progress Notes (Signed)
   11/03/23 1640  Vitals  Temp 98.1 F (36.7 C)  Pulse Rate 89  Resp 13  BP 123/76  SpO2 96 %  Weight  (SPECIALTY BED)  Oxygen Therapy  Patient Activity (if Appropriate) In bed  Pulse Oximetry Type Continuous  Post Treatment  Dialyzer Clearance Lightly streaked  Hemodialysis Intake (mL) 0 mL  Liters Processed 68.3  Fluid Removed (mL) 2500 mL  Tolerated HD Treatment Yes  AVG/AVF Arterial Site Held (minutes) 10 minutes  AVG/AVF Venous Site Held (minutes) 10 minutes   Received patient in bed to unit.  Alert and oriented.  Informed consent signed and in chart.   TX duration:3.25HRS  Patient tolerated well.  Transported back to the room  Alert, without acute distress.  Hand-off given to patient's nurse.   Access used: LAVF Access issues: NONE  Total UF removed: 2.5L Medication(s) given: NONE    Na'Shaminy T [facility]

## 2023-11-04 ENCOUNTER — Other Ambulatory Visit (HOSPITAL_COMMUNITY): Payer: Self-pay

## 2023-11-04 DIAGNOSIS — I639 Cerebral infarction, unspecified: Secondary | ICD-10-CM | POA: Diagnosis present

## 2023-11-04 NOTE — Progress Notes (Signed)
 Noted d/c orders. Contacted op HD clinic, [facility] to inform of pt d/c and anticipated arrival at clinic tomorrow.   Lavanda Carreen [facility] Dialysis Navigator [phone]

## 2023-11-04 NOTE — Progress Notes (Signed)
 Inpatient Rehabilitation Care Coordinator Discharge Note   Patient Details  Name: Nathan Campbell MRN: [id number] Date of Birth: Sep 09, 1975   Discharge location: D/c to his sister's home  Length of Stay: 16 days  Discharge activity level: overall Supervision to MIn A level  Home/community participation: Limited  Patient response un:Yzjouy Literacy - How often do you need to have someone help you when you read instructions, pamphlets, or other written material from your doctor or pharmacy?: Rarely  Patient response un:Dnrpjo Isolation - How often do you feel lonely or isolated from those around you?: Rarely  Services provided included: MD, RD, PT, OT, SLP, RN, CM, Pharmacy, Neuropsych, SW, TR  Financial Services:  Field seismologist Utilized: Private Insurance [facility] Medicare  Choices offered to/list presented to: patient and moth  Follow-up services arranged:  Home Health, Patient/Family has no preference for HH/DME agencies Home Health Agency: [facility] for HHPT/OT/SLP/aide         Patient response to transportation need: Is the patient able to respond to transportation needs?: Yes In the past 12 months, has lack of transportation kept you from medical appointments or from getting medications?: No In the past 12 months, has lack of transportation kept you from meetings, work, or from getting things needed for daily living?: No   Patient/Family verbalized understanding of follow-up arrangements:  Yes  Individual responsible for coordination of the follow-up plan: contact pt  or his mother [name]  Confirmed correct DME delivered: [name] [name] 11/04/2023    Comments (or additional information):fam edu completed  Summary of Stay    Date/Time Discharge Planning CSW  10/31/23 1120 Pt will d/c to home with support from his mother who will be primary caregiver. SHe will initially take to dialysis. [facility] [place] application for transportation was previously submitted  and pt has been active since June 2024. Fam edu on Wed 1pm-4pm with pt mother. HHA- [facility] for HHPT/OT/SLP.  SW will confirm there are no barriers to discharge. AAC  10/26/23 0954 Pt will d/c to home with support from his mother who will be primary caregiver. SHe will initially take to dialysis. SW will submit [facility] [place] application for assitance with transporation. SW will confirm there are no barriers to discharge. AAC  10/19/23 1127 TBA AAC       Jason Hauge A [name]

## 2023-11-04 NOTE — Progress Notes (Signed)
  [facility] Progress Note   Subjective:   Seen and examined in his room. Patient is planned for d/c today to his sister's house. He is excited about discharge today. Last HD 11/03/23 with 2.5L UF. Plan for next HD at his OP HD unit on 11/05/23  Objective Vitals:   11/03/23 1630 11/03/23 1637 11/03/23 1640 11/03/23 1739  BP: 98/82  123/76 106/72  Pulse: 93 82 89 85  Resp: 12 14 13 16   Temp:   98.1 F (36.7 C) 97.8 F (36.6 C)  TempSrc:      SpO2: 100% 95% 96% 100%  Weight:      Height:       Physical Exam General: well appearing, nad  Heart: RRR  Lungs: Normal wob  Abdomen: soft, NTND Extremities: no LE edema Dialysis Access: LU AVF   Filed Weights   11/01/23 2022  Weight: 131.3 kg    Intake/Output Summary (Last 24 hours) at 11/04/2023 1150 Last data filed at 11/03/2023 1833 Gross per 24 hour  Intake 480 ml  Output 2500 ml  Net -2020 ml    Additional Objective Labs: Basic Metabolic Panel: Recent Labs  Lab 10/29/23 0830 10/29/23 1959 11/01/23 1733  NA 134* 135 133*  K 5.0 3.9 5.4*  CL 91* 92* 88*  CO2 24 28 25   GLUCOSE 113* 92 91  BUN 77* 33* 88*  CREATININE 16.24* 8.47* 18.41*  CALCIUM  9.9 9.5 9.2  PHOS 8.2* 4.1 7.0*   Liver Function Tests: Recent Labs  Lab 10/29/23 0830 10/29/23 1959 11/01/23 1733  ALBUMIN 3.5 3.6 3.4*    CBC: Recent Labs  Lab 10/29/23 1430 11/01/23 1733  WBC 8.9 6.7  HGB 11.2* 10.4*  HCT 34.4* 32.2*  MCV 101.2* 100.0  PLT 307 239   Medications:   aspirin   81 mg Oral Daily   atorvastatin   40 mg Oral Daily   carvedilol   6.25 mg Oral BID   Chlorhexidine  Gluconate Cloth  6 each Topical Q0600   clopidogrel   75 mg Oral Daily   lanthanum   1,500 mg Oral TID WC   midodrine   5 mg Oral BID WC   mouth rinse  15 mL Mouth Rinse 4 times per day   pantoprazole   40 mg Oral QHS   senna-docusate  2 tablet Oral QHS    Dialysis Orders: TTS - AF 4:15hr, 500/600, EDW 138kg, 2K/2C bath, AVF, heparin  4000+  - Mircera 50mcg given 10/06/23 - Hectoral 7mcg IV q HD - Takes Auryxia  3/meals and Xphozah 30mg  BID   Assessment/Plan: Acute CVA: R MCA infarct s/p TNK. Now in CIR. AHRF:  Now extubated, on [place] O2.  ESRD: Continue HD on TTS schedule. Next HD 8/23 in the OP setting. Hyperkalemia: resolved with HD. Continue to avoid foods high in K.    HTN/volume: BP in goal.  On carvedilol  6.25 mg BID and midodrine  5mg  BID.  Anemia of ESRD: Hgb 10.4, no ESA for now. Secondary HPTH: Corrected Ca in goal. Phos 7 and he is on his home binders. VDRA on hold d/t hypercalcemia. Was changed to Fosrenol  here.   Nutrition: Dysphagia diet.  Constipation: per primary rehab team  Belvie Och, NP [facility] 11/04/2023,11:50 AM

## 2023-11-04 NOTE — Progress Notes (Signed)
 Inpatient Rehabilitation Discharge Medication Review by a Pharmacist  A complete drug regimen review was completed for this patient to identify any potential clinically significant medication issues.  High Risk Drug Classes Is patient taking? Indication by Medication  Antipsychotic No   Anticoagulant No   Antibiotic No   Opioid No   Antiplatelet Yes Clopidogrel , Aspirin  x 3 months then aspirin  alone - CVA   Hypoglycemics/insulin  No   Vasoactive Medication Yes Carvedilol - HTN Midodrine  - hypotension  Chemotherapy No   Other Yes Atorvastatin  - HLD Pantoprazole  - Reflux  Lanthanum  - ESRD Cyclobenzaprine  - prn spasms Midodrine  - hypotension Hydroxyzine  - itching Zofran : N/V Senokot: constipation     Type of Medication Issue Identified Description of Issue Recommendation(s)  Drug Interaction(s) (clinically significant)     Duplicate Therapy     Allergy     No Medication Administration End Date     Incorrect Dose     Additional Drug Therapy Needed     Significant med changes from prior encounter (inform family/care partners about these prior to discharge). Paused: amlodipine , Isordil  Restart or discontinue as appropriate. Communicate medication changes with patient/family at discharge  Other       Clinically significant medication issues were identified that warrant physician communication and completion of prescribed/recommended actions by midnight of the next day:  No  Time spent performing this drug regimen review (minutes): 30  Thank you for allowing pharmacy to be a part of this patient's care.   Nathan Campbell, PharmD 11/04/2023 8:27 AM   **Pharmacist phone directory can be found on [email or web address] listed under [facility]**

## 2023-11-04 NOTE — Discharge Planning (Signed)
 [facility] Patient Discharge Orders - [facility] CLINIC: [facility]  Patient's name: Nathan Campbell Admit/DC Dates: 10/18/2023 - 11/04/2023  DISCHARGE DIAGNOSES: Acute CVA: R MCA infarct. Received TNK. Did rehab at CIR  ESRD on HD   HD ORDER CHANGES: Heparin  change: no change EDW Change: yes New EDW: 130.5 kg Bath Change: yes - 2K/2.5 Ca  ANEMIA MANAGEMENT: Aranesp: Given: no    ESA dose for discharge: mircera 50 mcg IV q 2 weeks, to start on 11/05/23 IV Iron  dose at discharge: needs updated TSAT - per protocol Transfusion: Given: no  BONE/MINERAL MEDICATIONS: Hectorol /Calcitriol  change: no change Sensipar/Parsabiv change: per protocol  ACCESS INTERVENTION/CHANGE: no change Details:   RECENT LABS: Recent Labs  Lab 11/01/23 1733  HGB 10.4*  NA 133*  K 5.4*  CALCIUM  9.2  PHOS 7.0*  ALBUMIN 3.4*    IV ANTIBIOTICS: no Details:   OTHER/APPTS/LAB ORDERS: Please draw updated labs at next HD  D/C Meds to be reconciled by nurse after every discharge.  Completed By: Belvie Och, NP   Reviewed by: MD:______ RN_______

## 2023-11-04 NOTE — Progress Notes (Signed)
 PROGRESS NOTE   Subjective/Complaints: Pt working with OT. No new issues today. Still feeling constipated. Itching on going. No dizziness except when he has to strain on toilet to try to empty.   ROS: Patient denies fever, rash, sore throat, blurred vision, dizziness, nausea, vomiting, diarrhea, cough, shortness of breath or chest pain, joint or back/neck pain, headache, or mood change.    Objective:   DG Swallowing Func-Speech Pathology Result Date: 11/03/2023 Table formatting from the original result was not included. Modified Barium Swallow Study Patient Details Name: Jorah Hua MRN: [id number] Date of Birth: 1975-07-27 Today's Date: 11/03/2023 HPI/PMH: HPI: Pt is a 48 y/o male who presented 10/07/23 with left-sided weakness and slurred speech. MRI brain 7/26: Large right MCA territory infarct with associated diffuse cortical thickening and some mass effect. Pt s/p TNK & IR revascularization. Intubated 7/25-7/29. 7/29 ileus. PMH: CHF, CM, ESRD on HD TTS, HTN, obesity, HLD Clinical Impression: Patient presents with mild oropharyngeal dysphagia. Oral phase is characterized by lingual weakness and deviation resulting in need for occasional lingual pumping to achieve posterior transit. Patient with mild oral residuals, cleared through additional swallows. Pharyngeal phase is remarkable for delayed swallow initiation resulting in pooling of bolus in the vallecula and/or pyriform sinses before hyoid burst. This is seemingly improved since initial MBS, though still present. Patient with x1 instance of stagnant penetration of thin liquids and occasional transient penetration before/during swallow due to spill of bolus over epiglottis. No other penetration/aspiration present across all consistencies. Recommend initiation of dysphagia 3 diet with thin liquids. Patient would benefit from occaisonal double swallows to clear any oral/pharyngeal residuals.  Medications may be taken whole in thin liquids. DIGEST Swallow Severity Rating*  Safety: 1  Efficiency: 0  Overall Pharyngeal Swallow Severity: mild 1: mild; 2: moderate; 3: severe; 4: profound *The Dynamic Imaging Grade of Swallowing Toxicity is standardized for the head and neck cancer population, however, demonstrates promising clinical applications across populations to standardize the clinical rating of pharyngeal swallow safety and severity. Factors that may increase risk of adverse event in presence of aspiration Noe & Lianne 2021): Factors that may increase risk of adverse event in presence of aspiration Noe & Lianne 2021): Reduced cognitive function Recommendations/Plan: Swallowing Evaluation Recommendations Swallowing Evaluation Recommendations Recommendations: PO diet PO Diet Recommendation: Dysphagia 3 (Mechanical soft); Thin liquids (Level 0) Liquid Administration via: Cup; Straw Medication Administration: Whole meds with liquid Supervision: Staff to assist with self-feeding Swallowing strategies  : Minimize environmental distractions; Slow rate; Small bites/sips; Clear throat intermittently Postural changes: Position pt fully upright for meals Oral care recommendations: Oral care BID (2x/day) Treatment Plan Treatment Plan Treatment recommendations: Therapy as outlined in treatment plan below Follow-up recommendations: Home health SLP Functional status assessment: Patient has had a recent decline in their functional status and demonstrates the ability to make significant improvements in function in a reasonable and predictable amount of time. Treatment frequency: Min 2x/week Treatment duration: 3 weeks Interventions: Patient/family education; Diet toleration management by SLP; Aspiration precaution training; Compensatory techniques Recommendations Recommendations for follow up therapy are one component of a multi-disciplinary discharge planning process, led by the attending physician.   Recommendations may be updated based on patient  status, additional functional criteria and insurance authorization. Assessment: Orofacial Exam: Orofacial Exam Oral Cavity: Oral Hygiene: Pooled secretions Oral Cavity - Dentition: Adequate natural dentition Orofacial Anatomy: WFL Oral Motor/Sensory Function: Suspected cranial nerve impairment CN VII - Facial: Left motor impairment CN XII - Hypoglossal: Left motor impairment Anatomy: Anatomy: WFL Boluses Administered: Boluses Administered Boluses Administered: Thin liquids (Level 0); Puree; Solid  Oral Impairment Domain: Oral Impairment Domain Lip Closure: No labial escape Tongue control during bolus hold: Cohesive bolus between tongue to palatal seal Bolus preparation/mastication: Slow prolonged chewing/mashing with complete recollection Bolus transport/lingual motion: Slow tongue motion; Repetitive/disorganized tongue motion Oral residue: Residue collection on oral structures Location of oral residue : Palate; Tongue Initiation of pharyngeal swallow : Pyriform sinuses  Pharyngeal Impairment Domain: Pharyngeal Impairment Domain Soft palate elevation: No bolus between soft palate (SP)/pharyngeal wall (PW) Laryngeal elevation: Complete superior movement of thyroid cartilage with complete approximation of arytenoids to epiglottic petiole Anterior hyoid excursion: Complete anterior movement Epiglottic movement: Complete inversion Laryngeal vestibule closure: Complete, no air/contrast in laryngeal vestibule Pharyngeal stripping wave : Present - complete Pharyngoesophageal segment opening: Complete distension and complete duration, no obstruction of flow Tongue base retraction: No contrast between tongue base and posterior pharyngeal wall (PPW) Pharyngeal residue: Complete pharyngeal clearance  Esophageal Impairment Domain: Esophageal Impairment Domain Esophageal clearance upright position: Complete clearance, esophageal coating Pill: Pill Consistency administered: Thin  liquids (Level 0) Thin liquids (Level 0): [facility] Penetration/Aspiration Scale Score: Penetration/Aspiration Scale Score 1.  Material does not enter airway: Puree; Solid; Pill 3.  Material enters airway, remains ABOVE vocal cords and not ejected out: Thin liquids (Level 0) Compensatory Strategies: Compensatory Strategies Compensatory strategies: Yes Multiple swallows: Effective Effective Multiple Swallows: Thin liquid (Level 0)   General Information: Caregiver present: No  Diet Prior to this Study: Dysphagia 3 (mechanical soft); Mildly thick liquids (Level 2, nectar thick)   No data recorded  No data recorded  Supplemental O2: None (Room air)   No data recorded Behavior/Cognition: Alert; Cooperative Self-Feeding Abilities: Needs set-up for self-feeding No data recorded Volitional Cough: Able to elicit Volitional Swallow: Able to elicit Exam Limitations: No limitations Goal Planning: Prognosis for improved oropharyngeal function: Good No data recorded No data recorded Patient/Family Stated Goal: thin liquids Consulted and agree with results and recommendations: Patient; Nurse Pain: No data recorded End of Session: Start Time:SLP Start Time (ACUTE ONLY): 0900 Stop Time: SLP Stop Time (ACUTE ONLY): 0915 Time Calculation:SLP Time Calculation (min) (ACUTE ONLY): 15 min Charges: SLP Evaluations $ SLP Speech Visit: 1 Visit SLP Evaluations $MBS Swallow: 1 Procedure SLP visit diagnosis: SLP Visit Diagnosis: Dysphagia, oropharyngeal phase (R13.12) Past Medical History: Past Medical History: Diagnosis Date  Acute combined systolic and diastolic CHF, NYHA class 4 (HCC) 06/10/2016  Anemia   Cardiomyopathy (HCC) 06/14/2016  CHF (congestive heart failure) (HCC)   COVID 10/2020  Dyspnea   ESRD on hemodialysis (HCC)   TTS at [facility]  Hypertension   Hypertensive heart and kidney disease with heart failure (HCC) 06/14/2016  Hypertensive heart disease with congestive heart failure (HCC)   Obesity  Past Surgical History: Past Surgical  History: Procedure Laterality Date  A/V FISTULAGRAM Left 05/03/2022  Procedure: A/V Fistulagram;  Surgeon: Eliza Lonni RAMAN, MD;  Location: [facility] INVASIVE CV LAB;  Service: Cardiovascular;  Laterality: Left;  AV FISTULA PLACEMENT Left 11/11/2020  Procedure: LEFT ARM First Stage Basilic Vein Fistula Creation;  Surgeon: Sheree Penne Lonni, MD;  Location: [facility] OR;  Service: Vascular;  Laterality: Left;  BASCILIC VEIN TRANSPOSITION  Left 01/23/2021  Procedure: Second Stage Basilic Vein Transposition of Left Arm Arteriovenous  Fistula;  Surgeon: Sheree Penne Bruckner, MD;  Location: [facility] OR;  Service: Vascular;  Laterality: Left;  Block  to LMA   COLONOSCOPY    FISTULA SUPERFICIALIZATION Left 05/07/2022  Procedure: PLICATION OF ANEURYSM, LEFT ARM FISTULA;  Surgeon: Eliza Bruckner RAMAN, MD;  Location: [facility] OR;  Service: Vascular;  Laterality: Left;  IR CT HEAD LTD  10/07/2023  IR CT HEAD LTD  10/07/2023  IR PATIENT EVAL TECH 0-60 MINS  10/07/2023  IR PERCUTANEOUS ART THROMBECTOMY/INFUSION INTRACRANIAL INC DIAG ANGIO  10/07/2023  NO PAST SURGERIES    PERIPHERAL VASCULAR BALLOON ANGIOPLASTY Left 05/03/2022  Procedure: PERIPHERAL VASCULAR BALLOON ANGIOPLASTY;  Surgeon: Eliza Bruckner RAMAN, MD;  Location: [facility] INVASIVE CV LAB;  Service: Cardiovascular;  Laterality: Left;  AVF  RADIOLOGY WITH ANESTHESIA N/A 10/07/2023  Procedure: RADIOLOGY WITH ANESTHESIA;  Surgeon: Radiologist, Medication, MD;  Location: [facility] OR;  Service: Radiology;  Laterality: N/A; Cassidi F Sockwell 11/03/2023, 9:36 AM   Recent Labs    11/01/23 1733  WBC 6.7  HGB 10.4*  HCT 32.2*  PLT 239    Recent Labs    11/01/23 1733  NA 133*  K 5.4*  CL 88*  CO2 25  GLUCOSE 91  BUN 88*  CREATININE 18.41*  CALCIUM  9.2     Intake/Output Summary (Last 24 hours) at 11/04/2023 1029 Last data filed at 11/03/2023 1833 Gross per 24 hour  Intake 480 ml  Output 2500 ml  Net -2020 ml        Physical Exam: Vital Signs Blood pressure 106/72,  pulse 85, temperature 97.8 F (36.6 C), resp. rate 16, height 6' (1.829 m), weight 131.3 kg, SpO2 100%.   Constitutional: No distress . Vital signs reviewed. HEENT: NCAT, EOMI, oral membranes moist Neck: supple Cardiovascular: RRR without murmur. No JVD    Respiratory/Chest: CTA Bilaterally without wheezes or rales. Normal effort    GI/Abdomen: BS +, non-tender, non-distended Ext: no clubbing, cyanosis, or edema Psych: pleasant and cooperative  Skin: No evidence of breakdown, no evidence of rash Neurologic:alert, mild left VII,  Speech a little dysarthric.  motor strength is 5/5 in right deltoid, bicep, tricep, grip, hip flexor, knee extensors, ankle dorsiflexor and plantar flexor 2+ to 3-/5 in LUE at delt and biceps and grip, 1+ to 2-/5 L finger and arm extensors---stable in appearance , 4-/5 in LLE Sensory exam normal sensation to light touch  in bilateral lower extremities Absent to minimal LT LUE remains--didn't realize he had food on wrist this am Musculoskeletal: pain free  range of motion in all 4 extremities. No joint swelling Prior neuro assessment is c/w 11/04/2023 exam.    Assessment/Plan: 1. Functional deficits which require 3+ hours per day of interdisciplinary therapy in a comprehensive inpatient rehab setting. Physiatrist is providing close team supervision and 24 hour management of active medical problems listed below. Physiatrist and rehab team continue to assess barriers to discharge/monitor patient progress toward functional and medical goals  Care Tool:  Bathing    Body parts bathed by patient: Chest, Abdomen, Front perineal area, Right upper leg, Left upper leg, Face, Left arm, Buttocks, Right lower leg, Left lower leg, Right arm   Body parts bathed by helper: Right arm     Bathing assist Assist Level: Supervision/Verbal cueing     Upper Body Dressing/Undressing Upper body dressing   What is the patient wearing?: Pull over shirt    Upper body assist  Assist  Level: Minimal Assistance - Patient > 75%    Lower Body Dressing/Undressing Lower body dressing      What is the patient wearing?: Pants     Lower body assist Assist for lower body dressing: Minimal Assistance - Patient > 75%     Toileting Toileting Toileting Activity did not occur (Clothing management and hygiene only): N/A (no void or bm) (dialysis patient)  Toileting assist Assist for toileting: Minimal Assistance - Patient > 75%     Transfers Chair/bed transfer  Transfers assist     Chair/bed transfer assist level: Independent     Locomotion Ambulation   Ambulation assist      Assist level: Supervision/Verbal cueing Assistive device: No Device Max distance: 1064'   Walk 10 feet activity   Assist     Assist level: Supervision/Verbal cueing Assistive device: No Device   Walk 50 feet activity   Assist Walk 50 feet with 2 turns activity did not occur: Safety/medical concerns  Assist level: Supervision/Verbal cueing Assistive device: No Device    Walk 150 feet activity   Assist Walk 150 feet activity did not occur: Safety/medical concerns  Assist level: Supervision/Verbal cueing Assistive device: No Device    Walk 10 feet on uneven surface  activity   Assist Walk 10 feet on uneven surfaces activity did not occur: Safety/medical concerns   Assist level: Supervision/Verbal cueing Assistive device:  (no device)   Wheelchair     Assist Is the patient using a wheelchair?: No Type of Wheelchair: Manual    Wheelchair assist level: Total Assistance - Patient < 25% Max wheelchair distance: 220'    Wheelchair 50 feet with 2 turns activity    Assist        Assist Level: Dependent - Patient 0%   Wheelchair 150 feet activity     Assist      Assist Level: Dependent - Patient 0%   Blood pressure 106/72, pulse 85, temperature 97.8 F (36.6 C), resp. rate 16, height 6' (1.829 m), weight 131.3 kg, SpO2  100%.  Medical Problem List and Plan: 1. Functional deficits secondary to acute R MCA CVA with right M1 occlusion s/p TNK 7/25 and IR with TICI2b likely large vessel disease.             -patient may shower             -dc home today. Needs follow up with [facility], neurology, nephrology, pcp   2.  Antithrombotics: -DVT/anticoagulation:  Pharmaceutical: ambulatory             -antiplatelet therapy: continue Aspirin  and Plavix  x 3 months then Aspirin  alone.   3. Pain Management: Tylenol  and Flexeril  (back pain) as needed, kpad ordered  4. Daytime somnolence: d/c hydroxyzine   5. Neuropsych/cognition: This patient is not capable of making decisions on his own behalf.  6. Pruritus without rash , likely due to renal disease: continue lotion   -resumed atarax  for itching  -hopefully he'll see some improvement with time. Not sure what else to offer him 7. Fluids/Electrolytes/Nutrition/dysphagia: Monitor I/O and Daily weights.              - TF + protein supplements.     8/21-22 continue to push po. Should be better that he has been upgraded to thins along with D3 after his MBS!   8. ESRD on hemodialysis: BUN 139, Cr 18.98 HD on TTHS schedule- --Labs to be collected in HD             -  Hyperphos/Hyperkalemia - per Nephro 9. Chronic Anemia of ESRD: hgb 10.9 from 11.6 continue to monitor  10. Acute hypoxic/hypercapnic respiratory failure: On 2 L [place] CXR Vague lingular and left lower lobe airspace opacification    Suspect underlying OSA   11. Leukocytosis:     Latest Ref Rng & Units 11/01/2023    5:33 PM 10/29/2023    2:30 PM 10/24/2023    5:53 AM  CBC  WBC 4.0 - 10.5 K/uL 6.7  8.9  10.9   Hemoglobin 13.0 - 17.0 g/dL 89.5  88.7  88.6   Hematocrit 39.0 - 52.0 % 32.2  34.4  34.7   Platelets 150 - 400 K/uL 239  307  363    8/20 remains afebrile 12. Combined systolic and diastolic H: home meds amlodipine , isosorbide  and carvedilol .  Fluid management per nephrology.             -appears to be  euvolemic   13. Secondary HPTH: Home auryxia  cannot be given via NG tube--continue Fosrenol    14. HLD: LDL 85 continue Atorvastatin  40 mg   15. Obesity: BMI 39.77 educate on diet and weight loss to promote overall health and mobility.   16.  Hypertension/hypotension: Long-term goal normotensive.    Vitals:   11/03/23 1640 11/03/23 1739  BP: 123/76 106/72  Pulse: 89 85  Resp: 13 16  Temp: 98.1 F (36.7 C) 97.8 F (36.6 C)  SpO2: 96% 100%   Increase coreg  to 12.5 still hypertensive with elevated HR -8/13 d/c Norvasc  d/t orthostatic hypotension Also hold isordil  , cont coreg  due to tachy 8/22 bp's better with midodrine  and adjustments with HD  -continue midodrine  as outpt 17.  Hyperlipidemia: continue statin  18. Slow transit constipation  -add senokot-s 2 tabs at bedtime  -8/22 --pt with medium bm early this am, type 4.    -appreciate NP putting orders in for bowel meds!        LOS: 17 days A FACE TO FACE EVALUATION WAS PERFORMED  Arthea ONEIDA Gunther 11/04/2023, 10:29 AM

## 2023-11-05 ENCOUNTER — Telehealth: Payer: Self-pay | Admitting: Nephrology

## 2023-11-05 NOTE — Telephone Encounter (Signed)
 Transition of care contact from inpatient facility  Date of Discharge: 11/04/23 Date of Contact: 11/05/23 Method of contact: Phone  Attempted to contact patient to discuss transition of care from inpatient admission. Patient did not answer the phone. Unable to leave voicemail.

## 2023-11-07 ENCOUNTER — Encounter (HOSPITAL_COMMUNITY): Payer: Self-pay

## 2023-11-21 ENCOUNTER — Telehealth: Payer: Self-pay | Admitting: Neurology

## 2023-11-21 NOTE — Telephone Encounter (Signed)
 Patient's mother called to verify appointment date and time.

## 2023-11-28 ENCOUNTER — Telehealth: Payer: Self-pay

## 2023-11-28 ENCOUNTER — Other Ambulatory Visit (HOSPITAL_COMMUNITY): Payer: Self-pay

## 2023-11-28 NOTE — Telephone Encounter (Signed)
 PA for Lanthanum  Carbonate 750 MG sent

## 2023-12-02 ENCOUNTER — Other Ambulatory Visit: Payer: Self-pay

## 2023-12-02 ENCOUNTER — Ambulatory Visit
Admission: EM | Admit: 2023-12-02 | Discharge: 2023-12-02 | Disposition: A | Discharge status: Home / Self Care | Attending: Physician Assistant | Admitting: Physician Assistant

## 2023-12-02 DIAGNOSIS — M10372 Gout due to renal impairment, left ankle and foot: Secondary | ICD-10-CM | POA: Diagnosis not present

## 2023-12-02 DIAGNOSIS — M79672 Pain in left foot: Secondary | ICD-10-CM

## 2023-12-02 MED ORDER — PREDNISONE 20 MG PO TABS: 20.0000 mg | ORAL_TABLET | Freq: Every day | ORAL | 0 refills | Status: AC

## 2023-12-02 NOTE — Discharge Instructions (Signed)
 We are treating you for gout.  Please take prednisone  20 mg for 5 days.  Follow-up with your primary care.  If you have any worsening symptoms including increasing pain, fever, nausea, vomiting, difficulty walking you need to be seen immediately.

## 2023-12-02 NOTE — ED Triage Notes (Signed)
 Pt c/o gout in left foot started this morning upon waking. Pt has an area of swelling on anterior of left foot approx the size of a golf ball

## 2023-12-02 NOTE — ED Provider Notes (Signed)
 [facility]    CSN: [id number] Arrival date & time: 12/02/23  1214      History   Chief Complaint No chief complaint on file.   HPI Nathan Campbell is a 48 y.o. male.   Patient presents today with a several hour history of left foot pain and swelling.  He reports that pain is rated 10 on a 0-10 pain scale, described as throbbing, worse with palpation or attempted ambulation, no alleviating factors identified.  He does have a history of gout and reports current symptoms are similar to previous episodes of this condition.  He has not injured himself or fallen recently.  Denies any trauma or change in his activity.  He has not taken any over-the-counter medications for symptom management.  He denies any fever, nausea, vomiting, wounds in the region of pain.  He does have a history of end-stage renal disease on hemodialysis and typically receives treatments on Tuesday, Thursday, Saturday.  He has not missed any treatments.  He does report some recent medication changes following a hospitalization for CVA a few months ago.  He denies any recent dietary changes or increased alcohol consumption.  His last uric acid level was normal in Care Everywhere at 6.3 on 05/25/2023.  He denies history of diabetes and his last A1c was 5.1% 10/08/2023.  He reports generally requiring short course of prednisone  to manage his current symptoms.  Denies any recent steroids in the past 90 days.    Past Medical History:  Diagnosis Date   Acute combined systolic and diastolic CHF, NYHA class 4 (HCC) 06/10/2016   Anemia    Cardiomyopathy (HCC) 06/14/2016   CHF (congestive heart failure) (HCC)    COVID 10/2020   Dyspnea    ESRD on hemodialysis (HCC)    TTS at [facility]   Hypertension    Hypertensive heart and kidney disease with heart failure (HCC) 06/14/2016   Hypertensive heart disease with congestive heart failure (HCC)    Obesity     Patient Active Problem List   Diagnosis Date Noted   Stroke  (HCC) 11/04/2023   Cognitive and neurobehavioral dysfunction 10/24/2023   Acute hypoxic respiratory failure (HCC) 10/17/2023   Essential hypertension 10/17/2023   Dysphagia 10/17/2023   Leukocytosis 10/17/2023   Obesity 10/17/2023   Acute ischemic stroke (HCC) 10/07/2023   Middle cerebral artery embolism, right 10/07/2023   ESRD (end stage renal disease) (HCC) 03/26/2022   Hyperlipidemia, unspecified 03/20/2021   Acute gout due to renal impairment involving left wrist 04/24/2019   Malignant hypertension 10/14/2016   Combined congestive systolic and diastolic heart failure (HCC) 06/14/2016   NICM (nonischemic cardiomyopathy) (HCC) 06/14/2016   Renal failure    Hypertensive urgency 06/10/2016   CKD (chronic kidney disease), stage IV (HCC) 06/10/2016   Normocytic anemia 06/10/2016    Past Surgical History:  Procedure Laterality Date   A/V FISTULAGRAM Left 05/03/2022   Procedure: A/V Fistulagram;  Surgeon: Eliza Lonni RAMAN, MD;  Location: [facility] INVASIVE CV LAB;  Service: Cardiovascular;  Laterality: Left;   AV FISTULA PLACEMENT Left 11/11/2020   Procedure: LEFT ARM First Stage Basilic Vein Fistula Creation;  Surgeon: Sheree Penne Lonni, MD;  Location: [facility] OR;  Service: Vascular;  Laterality: Left;   BASCILIC VEIN TRANSPOSITION Left 01/23/2021   Procedure: Second Stage Basilic Vein Transposition of Left Arm Arteriovenous  Fistula;  Surgeon: Sheree Penne Lonni, MD;  Location: [facility] OR;  Service: Vascular;  Laterality: Left;  Block  to LMA    COLONOSCOPY  FISTULA SUPERFICIALIZATION Left 05/07/2022   Procedure: PLICATION OF ANEURYSM, LEFT ARM FISTULA;  Surgeon: Eliza Lonni RAMAN, MD;  Location: [facility] OR;  Service: Vascular;  Laterality: Left;   IR CT HEAD LTD  10/07/2023   IR CT HEAD LTD  10/07/2023   IR PATIENT EVAL TECH 0-60 MINS  10/07/2023   IR PERCUTANEOUS ART THROMBECTOMY/INFUSION INTRACRANIAL INC DIAG ANGIO  10/07/2023   NO PAST SURGERIES     PERIPHERAL VASCULAR BALLOON  ANGIOPLASTY Left 05/03/2022   Procedure: PERIPHERAL VASCULAR BALLOON ANGIOPLASTY;  Surgeon: Eliza Lonni RAMAN, MD;  Location: [facility] INVASIVE CV LAB;  Service: Cardiovascular;  Laterality: Left;  AVF   RADIOLOGY WITH ANESTHESIA N/A 10/07/2023   Procedure: RADIOLOGY WITH ANESTHESIA;  Surgeon: Radiologist, Medication, MD;  Location: [facility] OR;  Service: Radiology;  Laterality: N/A;       Home Medications    Prior to Admission medications   Medication Sig Start Date End Date Taking? Authorizing Provider  predniSONE  (DELTASONE ) 20 MG tablet Take 1 tablet (20 mg total) by mouth daily for 5 days. 12/02/23 12/07/23 Yes Carolynne Schuchard, Rocky POUR, PA-C  amLODipine  (NORVASC ) 10 MG tablet Place 1 tablet (10 mg total) into feeding tube daily. 10/18/23   Waddell Karna LABOR, NP  aspirin  81 MG chewable tablet Chew 1 tablet (81 mg total) by mouth daily. 11/04/23   Leak, Brandi L, NP  atorvastatin  (LIPITOR) 40 MG tablet Take 1 tablet (40 mg total) by mouth daily. 11/04/23   Leak, Brandi L, NP  camphor-menthol  (SARNA) lotion Apply topically as needed for itching. 11/03/23   Leak, Brandi L, NP  carvedilol  (COREG ) 6.25 MG tablet Take 1 tablet (6.25 mg total) by mouth 2 (two) times daily. 11/03/23   Leak, Brandi L, NP  clopidogrel  (PLAVIX ) 75 MG tablet Take 1 tablet (75 mg total) by mouth daily. 11/04/23   Leak, Brandi L, NP  cyclobenzaprine  (FLEXERIL ) 5 MG tablet Take 1 tablet (5 mg total) by mouth 3 (three) times daily as needed for muscle spasms. 11/03/23   Leak, Brandi L, NP  hydrOXYzine  (ATARAX ) 10 MG tablet Take 1 tablet (10 mg total) by mouth 3 (three) times daily as needed for itching. 11/03/23   Leak, Brandi L, NP  insulin  aspart (NOVOLOG ) 100 UNIT/ML injection Inject 0-6 Units into the skin every 4 (four) hours. 10/18/23   Waddell Karna LABOR, NP  isosorbide  dinitrate (ISORDIL ) 10 MG tablet Place 0.5 tablets (5 mg total) into feeding tube 2 (two) times daily. 10/18/23   Waddell Karna LABOR, NP  lanthanum  (FOSRENOL ) 750 MG chewable tablet Chew 2  tablets (1,500 mg total) by mouth 3 (three) times daily with meals. 11/03/23   Leak, Brandi L, NP  midodrine  (PROAMATINE ) 5 MG tablet Take 1 tablet (5 mg total) by mouth 2 (two) times daily with breakfast and lunch. 11/04/23   Leak, Brandi L, NP  ondansetron  (ZOFRAN ) 4 MG tablet Take 1 tablet (4 mg total) by mouth every 6 (six) hours as needed for nausea or vomiting. 11/03/23   Leak, Brandi L, NP  pantoprazole  (PROTONIX ) 40 MG tablet Take 1 tablet (40 mg total) by mouth at bedtime. 11/03/23   Leak, Brandi L, NP  senna-docusate (SENOKOT-S) 8.6-50 MG tablet Take 2 tablets by mouth at bedtime. 11/03/23   Leak, Brandi L, NP  witch hazel-glycerin  (TUCKS) pad Apply topically as needed for hemorrhoids. 11/03/23   Leak, Brandi L, NP    Family History Family History  Problem Relation Age of Onset   Hypertension Mother  Diabetes Father    Hypertension Father    Hypertension Sister    Hypertension Paternal Grandmother    Diabetes Mellitus II Paternal Grandfather    Heart disease Cousin    Heart disease Cousin    Colon cancer Cousin    Esophageal cancer Neg Hx    Rectal cancer Neg Hx    Stomach cancer Neg Hx     Social History Social History   Tobacco Use   Smoking status: Some Days    Current packs/day: 0.00    Types: Cigarettes    Last attempt to quit: 09/2015    Years since quitting: 8.2   Smokeless tobacco: Never   Tobacco comments:    occasionally  Vaping Use   Vaping status: Some Days  Substance Use Topics   Alcohol use: Not Currently   Drug use: Not Currently     Allergies   Patient has no known allergies.   Review of Systems Review of Systems  Constitutional:  Positive for activity change. Negative for appetite change, fatigue and fever.  Gastrointestinal:  Negative for abdominal pain, diarrhea, nausea and vomiting.  Musculoskeletal:  Positive for arthralgias, gait problem and joint swelling. Negative for myalgias.  Skin:  Negative for color change and wound.   Neurological:  Negative for weakness and numbness.     Physical Exam Triage Vital Signs ED Triage Vitals  Encounter Vitals Group     BP 12/02/23 1340 (!) 148/95     Girls Systolic BP Percentile --      Girls Diastolic BP Percentile --      Boys Systolic BP Percentile --      Boys Diastolic BP Percentile --      Pulse Rate 12/02/23 1340 81     Resp 12/02/23 1340 16     Temp 12/02/23 1340 98.1 F (36.7 C)     Temp Source 12/02/23 1340 Oral     SpO2 12/02/23 1340 91 %     Weight --      Height --      Head Circumference --      Peak Flow --      Pain Score 12/02/23 1336 9     Pain Loc --      Pain Education --      Exclude from Growth Chart --    No data found.  Updated Vital Signs BP (!) 148/95   Pulse 81   Temp 98.1 F (36.7 C) (Oral)   Resp 16   SpO2 91%   Visual Acuity Right Eye Distance:   Left Eye Distance:   Bilateral Distance:    Right Eye Near:   Left Eye Near:    Bilateral Near:     Physical Exam Vitals reviewed.  Constitutional:      General: He is awake.     Appearance: Normal appearance. He is well-developed. He is not ill-appearing.     Comments: Very pleasant male appears stated age in no acute distress sitting comfortably in exam room  HENT:     Head: Normocephalic and atraumatic.     Mouth/Throat:     Pharynx: Uvula midline. No oropharyngeal exudate or posterior oropharyngeal erythema.  Cardiovascular:     Rate and Rhythm: Normal rate and regular rhythm.     Heart sounds: Normal heart sounds, S1 normal and S2 normal. No murmur heard.    Comments: Capillary fill within 2 seconds left toes Pulmonary:     Effort: Pulmonary effort is normal.  Breath sounds: Normal breath sounds. No stridor. No wheezing, rhonchi or rales.     Comments: Clear to auscultation bilaterally Musculoskeletal:     Left foot: Normal range of motion and normal capillary refill. Swelling and tenderness present. No bony tenderness.       Feet:     Comments: Left  foot: Tenderness palpation over dorsal left foot with associated swelling.  No rash or wound noted.  Normal active range of motion at ankle and toes.  Foot is neurovascularly intact.  Feet:     Left foot:     Protective Sensation: 10 sites tested.  10 sites sensed.     Skin integrity: No ulcer, blister, skin breakdown or erythema.     Toenail Condition: Left toenails are abnormally thick.  Neurological:     Mental Status: He is alert.  Psychiatric:        Behavior: Behavior is cooperative.      UC Treatments / Results  Labs (all labs ordered are listed, but only abnormal results are displayed) Labs Reviewed - No data to display  EKG   Radiology No results found.  Procedures Procedures (including critical care time)  Medications Ordered in UC Medications - No data to display  Initial Impression / Assessment and Plan / UC Course  I have reviewed the triage vital signs and the nursing notes.  Pertinent labs & imaging results that were available during my care of the patient were reviewed by me and considered in my medical decision making (see chart for details).     Patient is well-appearing, afebrile, nontoxic, nontachycardic.  Plain films were deferred as patient denies any recent trauma and has no focal bony tenderness on exam.  Low suspicion for septic arthritis as he has no fever and symptoms are consistent previous episodes of gout.  Will treat empirically for gout given his clinical presentation with prednisone  20 mg for 5 days.  We discussed that if his symptoms worsen in any way and he has spread of pain, significant erythema, fever, nausea, vomiting he needs to be seen emergently.  Recommend close follow-up with his primary care.  Strict return precautions given.  All questions were answered to patient satisfaction.  Final Clinical Impressions(s) / UC Diagnoses   Final diagnoses:  Acute gout due to renal impairment involving left foot  Left foot pain      Discharge Instructions      We are treating you for gout.  Please take prednisone  20 mg for 5 days.  Follow-up with your primary care.  If you have any worsening symptoms including increasing pain, fever, nausea, vomiting, difficulty walking you need to be seen immediately.     ED Prescriptions     Medication Sig Dispense Auth. Provider   predniSONE  (DELTASONE ) 20 MG tablet Take 1 tablet (20 mg total) by mouth daily for 5 days. 5 tablet Paco Cislo K, PA-C      PDMP not reviewed this encounter.   Sherrell Rocky POUR, PA-C 12/02/23 1410

## 2023-12-19 ENCOUNTER — Other Ambulatory Visit (HOSPITAL_COMMUNITY): Payer: Self-pay | Admitting: Nephrology

## 2023-12-19 DIAGNOSIS — R3915 Urgency of urination: Secondary | ICD-10-CM

## 2023-12-22 NOTE — Therapy (Signed)
 OUTPATIENT PHYSICAL THERAPY NEURO EVALUATION   Patient Name: Coral Soler MRN: [id number] DOB:12-23-1975, 48 y.o., male Today's Date: 12/23/2023   PCP: Pt reports PCP is Bl[facility] but did not give any further identifying information. REFERRING PROVIDER: Sebastian Jerilyn HERO, FNP  END OF SESSION:  PT End of Session - 12/23/23 0933     Visit Number 1    Number of Visits 17   16 plus Eval   Date for Recertification  02/24/24   For scheduling delays   Authorization Type [facility] Medicare    Authorization Time Period Pending    PT Start Time 0930    PT Stop Time 1017    PT Time Calculation (min) 47 min    Equipment Utilized During Treatment Gait belt    Activity Tolerance Patient tolerated treatment well    Behavior During Therapy WFL for tasks assessed/performed          Past Medical History:  Diagnosis Date   Acute combined systolic and diastolic CHF, NYHA class 4 (HCC) 06/10/2016   Anemia    Cardiomyopathy (HCC) 06/14/2016   CHF (congestive heart failure) (HCC)    COVID 10/2020   Dyspnea    ESRD on hemodialysis (HCC)    TTS at [facility]   Hypertension    Hypertensive heart and kidney disease with heart failure (HCC) 06/14/2016   Hypertensive heart disease with congestive heart failure (HCC)    Obesity    Past Surgical History:  Procedure Laterality Date   A/V FISTULAGRAM Left 05/03/2022   Procedure: A/V Fistulagram;  Surgeon: Eliza Lonni RAMAN, MD;  Location: [facility] INVASIVE CV LAB;  Service: Cardiovascular;  Laterality: Left;   AV FISTULA PLACEMENT Left 11/11/2020   Procedure: LEFT ARM First Stage Basilic Vein Fistula Creation;  Surgeon: Sheree Penne Lonni, MD;  Location: [facility] OR;  Service: Vascular;  Laterality: Left;   BASCILIC VEIN TRANSPOSITION Left 01/23/2021   Procedure: Second Stage Basilic Vein Transposition of Left Arm Arteriovenous  Fistula;  Surgeon: Sheree Penne Lonni, MD;  Location: [facility] OR;  Service: Vascular;  Laterality: Left;  Block  to LMA     COLONOSCOPY     FISTULA SUPERFICIALIZATION Left 05/07/2022   Procedure: PLICATION OF ANEURYSM, LEFT ARM FISTULA;  Surgeon: Eliza Lonni RAMAN, MD;  Location: [facility] OR;  Service: Vascular;  Laterality: Left;   IR CT HEAD LTD  10/07/2023   IR CT HEAD LTD  10/07/2023   IR PATIENT EVAL TECH 0-60 MINS  10/07/2023   IR PERCUTANEOUS ART THROMBECTOMY/INFUSION INTRACRANIAL INC DIAG ANGIO  10/07/2023   NO PAST SURGERIES     PERIPHERAL VASCULAR BALLOON ANGIOPLASTY Left 05/03/2022   Procedure: PERIPHERAL VASCULAR BALLOON ANGIOPLASTY;  Surgeon: Eliza Lonni RAMAN, MD;  Location: [facility] INVASIVE CV LAB;  Service: Cardiovascular;  Laterality: Left;  AVF   RADIOLOGY WITH ANESTHESIA N/A 10/07/2023   Procedure: RADIOLOGY WITH ANESTHESIA;  Surgeon: Radiologist, Medication, MD;  Location: [facility] OR;  Service: Radiology;  Laterality: N/A;   Patient Active Problem List   Diagnosis Date Noted   Stroke (HCC) 11/04/2023   Cognitive and neurobehavioral dysfunction 10/24/2023   Acute hypoxic respiratory failure (HCC) 10/17/2023   Essential hypertension 10/17/2023   Dysphagia 10/17/2023   Leukocytosis 10/17/2023   Obesity 10/17/2023   Acute ischemic stroke (HCC) 10/07/2023   Middle cerebral artery embolism, right 10/07/2023   ESRD (end stage renal disease) (HCC) 03/26/2022   Hyperlipidemia, unspecified 03/20/2021   Acute gout due to renal impairment involving left wrist 04/24/2019   Malignant hypertension 10/14/2016  Combined congestive systolic and diastolic heart failure (HCC) 06/14/2016   NICM (nonischemic cardiomyopathy) (HCC) 06/14/2016   Renal failure    Hypertensive urgency 06/10/2016   CKD (chronic kidney disease), stage IV (HCC) 06/10/2016   Normocytic anemia 06/10/2016    ONSET DATE: 12/14/2023- date of referral  REFERRING DIAG: G81.90 (ICD-10-CM) - Hemiparesis (HCC)  THERAPY DIAG:  Cerebrovascular accident (CVA) due to embolism of right middle cerebral artery (HCC)  Other abnormalities of gait  and mobility  Rationale for Evaluation and Treatment: Rehabilitation  SUBJECTIVE:                                                                                                                                                                                              SUBJECTIVE STATEMENT: Likes to be called LC.  Reports h/o htn issues but had low BP in hospital.  Sees PCP Oct 17th.  Here in therapy to get back to where he was before the stroke.  Reports difficulty with L UE (playing video games, carrying items, opening containers, etc); difficulty with speech (garbled speech noted).  Has been walking a lot and also walking his dog.  L drooling noted during session with L facial droop.  Pocketing medication or food noted occasionally per pt report.  No SLP referral noted. Pt accompanied by: self.  Mom drove pt and was in waiting room.  PERTINENT HISTORY: Hospitalization 10/07/23 for acute onset of left-sided weakness, left side neglect, dysarthria and right gaze preference; imaging showing acute right MCA CVA with right M1 occlusion (intubated for airway protection; s/p TNK; s/p revascularization of R MCA with IR 7/25).  IP rehab stay 10/18/23-11/04/23. Urgent care visit 12/02/23 for gout (L foot).  PMH includes: gout, CKD, ESRD on HD T/R/Sat, CHF, anemia, htn, stroke 10/07/23, acute hypoxic respiratory failure, dysphagia, cardiomyopathy, and obesity (BMI 41).  PAIN:  Are you having pain? Yes: NPRS scale: 0/10 Pain location: L shoulder Pain description: Aching Aggravating factors: Laying down Relieving factors: Muscle relaxer  PRECAUTIONS: Fall  RED FLAGS: None   WEIGHT BEARING RESTRICTIONS: No  FALLS: Has patient fallen in last 6 months? No  LIVING ENVIRONMENT: Lives with: Sister and her 2 sons; mom currently staying with pt to assist as needed Lives in: Other Townhouse--stays on 2nd floor Stairs: Yes: Internal: 5 steps; on right going up and External: 0 steps; none Has following  equipment at home: Crutches  PLOF: Independent  PATIENT GOALS: To get more function in arm; get voice back; get strength up.  OBJECTIVE:  Note: Objective measures were completed at Evaluation unless otherwise noted.  COGNITION: Overall cognitive status: A&Ox4; possible  decreased awareness of deficits  COORDINATION:  Rapid Alternating toe taps (in sitting): Decreased L LE  Heel to shin (in sitting); mild decreased L LE  LOWER EXTREMITY ROM:     Active  Right Eval Left Eval  Hip flexion WNL WNL  Hip extension    Hip abduction    Hip adduction    Hip internal rotation    Hip external rotation    Knee flexion WNL WNL  Knee extension WNL WNL  Ankle dorsiflexion WNL WNL  Ankle plantarflexion WNL WNL  Ankle inversion    Ankle eversion     (Blank rows = not tested)  LOWER EXTREMITY MMT:    MMT Right Eval Left Eval  Hip flexion 5/5 4+/5  Hip extension    Hip abduction    Hip adduction    Hip internal rotation    Hip external rotation    Knee flexion 5/5 4+/5  Knee extension 5/5 5/5  Ankle dorsiflexion 5/5 4+/5  Ankle plantarflexion At least 3/5 AROM At least 3/5 AROM  Ankle inversion    Ankle eversion    (Blank rows = not tested) GAIT: Findings: Gait Characteristics: step through pattern, decreased arm swing- Left, decreased ankle dorsiflexion- Left, and wide BOS, Distance walked: clinic distances, Assistive device utilized:None, Level of assistance: SBA, and Comments: pt tending to walk close to items on L sided but did not bump into anything  FUNCTIONAL TESTS:  5 times sit to stand: 20.81 seconds 10 meter walk test: 1.25 m/sec (7.97 seconds); no AD use Functional gait assessment: TBA SLS: R LE 1st 2 trials about 1 second each but on 3rd trial 9.0 seconds; L LE 1st 2 trials about 1 second each but on 3rd trial 30 seconds L LE (time stopped by therapist at 30 seconds) TUG (Cognitive): TBA  PATIENT SURVEYS:  Stroke Impact Scale : TBA                                                                                                                              TREATMENT DATE: 12/23/23  BP and HR taken in sitting at rest (see below for details). Vitals:   12/23/23 0938  BP: (!) 145/89  Pulse: 89   Therapeutic Exercise Pt issued written HEP (see below for details). HEP performed in clinic: - Standing March with Counter Support: x10 reps B LE's - Standing Hip Abduction with Counter Support: x10 reps B LE's (decreased quality noted with L LE) - Standing Single Leg Stance with Counter Support x1 reps B LE's with 30 second hold  PATIENT EDUCATION: Education details: Eval results; POC; HEP; see above. Person educated: Patient Education method: Explanation, Demonstration, Verbal cues, and Handouts Education comprehension: verbalized understanding and returned demonstration  HOME EXERCISE PROGRAM: 12/23/23 Access Code: [email or web address] URL: [email or web address] Date: 12/23/2023 Prepared by: Damien Caulk  Exercises - Standing March with Counter Support  - 1 x daily - 5 x weekly - 2 sets - 10 reps -  Standing Hip Abduction with Counter Support  - 1 x daily - 5 x weekly - 2 sets - 10 reps - Standing Single Leg Stance with Counter Support  - 1 x daily - 5 x weekly - 2 sets - 1 reps - 30 second hold  GOALS: Goals reviewed with patient? Yes  SHORT TERM GOALS: Target date: 01/20/2024  Patient will demo at least 50% compliance with HEP to self manage symptoms.  Baseline: Goal status: INITIAL  2.  Assess FGA Baseline: TBA Goal status: INITIAL  3.  Assess TUG (Cognitive) Baseline: TBA Goal status: INITIAL   LONG TERM GOALS: Target date: 02/17/2024   Pt will decrease 5 Time Sit to Stand by at least 3 seconds in order to demonstrate clinically significant improvement in LE strength.  Baseline: 20.81 seconds (Eval) Goal status: INITIAL  2.  Pt will decrease TUG (Cognitive) by at least (or equal to) 3 seconds in order to demonstrate  decreased fall risk. Baseline: TBA  Goal status: INITIAL  3.  Patient will demo at least 5 points of improvement on FGA to improve functional balance and reduce fall risk. Baseline: TBA Goal status: INITIAL  4.  Pt will be independent with final HEP in order to improve strength and balance, to decrease fall risk, and improve function at home for ADL's and at work. Baseline:  Goal status: INITIAL   ASSESSMENT:  CLINICAL IMPRESSION: Patient is a 48 y.o. male who was seen today for physical therapy evaluation and treatment for gait abnormality and muscle weakness due to recent stroke. Patient demonstrates gait, balance, L UE, and L LE (strength and coordination) impairments. These impairments are impacting pt's ability to perform ADL's and daily functional activities.  Pt scored 20.81 seconds on the 5 time sit to stand test indicating pt is at increased risk of falls.  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  Pt initially unable to maintain SLS B LE's more than about 1 second on 1st 2 trials but on 3rd trial able to achieve 9.0 seconds on R LE and at least 30 seconds on L LE (limited per therapist) demonstrating concerns with balance.  Pt appearing with possible L inattention; will need to monitor.  Patient will benefit from skilled PT to address these impairments and improve overall function.   OBJECTIVE IMPAIRMENTS: Abnormal gait, cardiopulmonary status limiting activity, decreased activity tolerance, decreased balance, decreased cognition, decreased coordination, decreased endurance, decreased knowledge of condition, decreased knowledge of use of DME, decreased mobility, difficulty walking, decreased strength, decreased safety awareness, increased fascial restrictions, impaired flexibility, impaired sensation, impaired tone, impaired UE functional use, impaired vision/preception, improper body mechanics, and pain.    ACTIVITY LIMITATIONS: carrying, lifting, bending, standing, squatting, sleeping, stairs, transfers, locomotion level, and caring for others  PARTICIPATION LIMITATIONS: meal prep, cleaning, laundry, medication management, driving, shopping, community activity, occupation, and yard work  PERSONAL FACTORS: Age, Time since onset of injury/illness/exacerbation, and 1-2 comorbidities: CHF, hx of CVA, ESRD are also affecting patient's functional outcome.   REHAB POTENTIAL: Good  CLINICAL DECISION MAKING: Evolving/moderate complexity  EVALUATION COMPLEXITY: Moderate  PLAN:  PT FREQUENCY: 2x/week  PT DURATION: 8 weeks  PLANNED INTERVENTIONS: [id number]- PT Re-evaluation, 97750- Physical Performance Testing, 97110-Therapeutic exercises, 97530- Therapeutic activity, [id number]- Neuromuscular re-education, 97535- Self Care, [id number]- Manual therapy, [id number]- Gait training, [phone]- Orthotic/Prosthetic subsequent, [phone]- Aquatic Therapy, [phone]- Electrical stimulation (unattended), Patient/Family education, Balance training, Stair training, Taping, Joint  mobilization, Spinal mobilization, Cognitive remediation, DME instructions, Wheelchair mobility training, Cryotherapy, and Moist heat  PLAN FOR NEXT SESSION: SLP referral?; Assess FGA and TUG cognitive; higher level balance with cognitive tasks/dual tasks; Bl[employer]s; challenge awareness of L side   Damien Caulk, PT 12/23/2023, 1:11 PM

## 2023-12-23 ENCOUNTER — Ambulatory Visit

## 2023-12-23 ENCOUNTER — Other Ambulatory Visit: Payer: Self-pay

## 2023-12-23 ENCOUNTER — Ambulatory Visit: Attending: Family Medicine | Admitting: Physical Therapy

## 2023-12-23 ENCOUNTER — Encounter: Payer: Self-pay | Admitting: Physical Therapy

## 2023-12-23 ENCOUNTER — Encounter: Admitting: Physical Medicine & Rehabilitation

## 2023-12-23 VITALS — BP 145/89 | HR 89

## 2023-12-23 DIAGNOSIS — R278 Other lack of coordination: Secondary | ICD-10-CM | POA: Diagnosis present

## 2023-12-23 DIAGNOSIS — I63411 Cerebral infarction due to embolism of right middle cerebral artery: Secondary | ICD-10-CM | POA: Insufficient documentation

## 2023-12-23 DIAGNOSIS — I69854 Hemiplegia and hemiparesis following other cerebrovascular disease affecting left non-dominant side: Secondary | ICD-10-CM

## 2023-12-23 DIAGNOSIS — R29818 Other symptoms and signs involving the nervous system: Secondary | ICD-10-CM | POA: Diagnosis present

## 2023-12-23 DIAGNOSIS — R29898 Other symptoms and signs involving the musculoskeletal system: Secondary | ICD-10-CM | POA: Insufficient documentation

## 2023-12-23 DIAGNOSIS — R208 Other disturbances of skin sensation: Secondary | ICD-10-CM | POA: Diagnosis present

## 2023-12-23 DIAGNOSIS — M6281 Muscle weakness (generalized): Secondary | ICD-10-CM | POA: Diagnosis present

## 2023-12-23 DIAGNOSIS — R41844 Frontal lobe and executive function deficit: Secondary | ICD-10-CM | POA: Insufficient documentation

## 2023-12-23 DIAGNOSIS — R2689 Other abnormalities of gait and mobility: Secondary | ICD-10-CM | POA: Insufficient documentation

## 2023-12-23 NOTE — Therapy (Signed)
 OUTPATIENT OCCUPATIONAL THERAPY NEURO EVALUATION  Patient Name: Nathan Campbell MRN: [id number] DOB:02-05-1976, 48 y.o., male Today's Date: 12/23/2023  PCP: None REFERRING PROVIDER: Sebastian Jerilyn HERO, FNP  END OF SESSION:  OT End of Session - 12/23/23 1038     Visit Number 1    Number of Visits 16    Date for Recertification  02/22/24    Authorization Type [employer] MCR    OT Start Time [phone]    OT Stop Time 0930    OT Time Calculation (min) 47 min    Equipment Utilized During Treatment dynamometer, 9HPT, box and blocks    Activity Tolerance Patient tolerated treatment well   some lethargy noted this date   Behavior During Therapy [facility] for tasks assessed/performed          Past Medical History:  Diagnosis Date   Acute combined systolic and diastolic CHF, NYHA class 4 (HCC) 06/10/2016   Anemia    Cardiomyopathy (HCC) 06/14/2016   CHF (congestive heart failure) (HCC)    COVID 10/2020   Dyspnea    ESRD on hemodialysis (HCC)    TTS at [facility]   Hypertension    Hypertensive heart and kidney disease with heart failure (HCC) 06/14/2016   Hypertensive heart disease with congestive heart failure (HCC)    Obesity    Past Surgical History:  Procedure Laterality Date   A/V FISTULAGRAM Left 05/03/2022   Procedure: A/V Fistulagram;  Surgeon: Eliza Lonni RAMAN, MD;  Location: [facility] INVASIVE CV LAB;  Service: Cardiovascular;  Laterality: Left;   AV FISTULA PLACEMENT Left 11/11/2020   Procedure: LEFT ARM First Stage Basilic Vein Fistula Creation;  Surgeon: Sheree Penne Lonni, MD;  Location: [facility] OR;  Service: Vascular;  Laterality: Left;   BASCILIC VEIN TRANSPOSITION Left 01/23/2021   Procedure: Second Stage Basilic Vein Transposition of Left Arm Arteriovenous  Fistula;  Surgeon: Sheree Penne Lonni, MD;  Location: [facility] OR;  Service: Vascular;  Laterality: Left;  Block  to LMA    COLONOSCOPY     FISTULA SUPERFICIALIZATION Left 05/07/2022   Procedure: PLICATION OF ANEURYSM,  LEFT ARM FISTULA;  Surgeon: Eliza Lonni RAMAN, MD;  Location: [facility] OR;  Service: Vascular;  Laterality: Left;   IR CT HEAD LTD  10/07/2023   IR CT HEAD LTD  10/07/2023   IR PATIENT EVAL TECH 0-60 MINS  10/07/2023   IR PERCUTANEOUS ART THROMBECTOMY/INFUSION INTRACRANIAL INC DIAG ANGIO  10/07/2023   NO PAST SURGERIES     PERIPHERAL VASCULAR BALLOON ANGIOPLASTY Left 05/03/2022   Procedure: PERIPHERAL VASCULAR BALLOON ANGIOPLASTY;  Surgeon: Eliza Lonni RAMAN, MD;  Location: [facility] INVASIVE CV LAB;  Service: Cardiovascular;  Laterality: Left;  AVF   RADIOLOGY WITH ANESTHESIA N/A 10/07/2023   Procedure: RADIOLOGY WITH ANESTHESIA;  Surgeon: Radiologist, Medication, MD;  Location: [facility] OR;  Service: Radiology;  Laterality: N/A;   Patient Active Problem List   Diagnosis Date Noted   Stroke (HCC) 11/04/2023   Cognitive and neurobehavioral dysfunction 10/24/2023   Acute hypoxic respiratory failure (HCC) 10/17/2023   Essential hypertension 10/17/2023   Dysphagia 10/17/2023   Leukocytosis 10/17/2023   Obesity 10/17/2023   Acute ischemic stroke (HCC) 10/07/2023   Middle cerebral artery embolism, right 10/07/2023   ESRD (end stage renal disease) (HCC) 03/26/2022   Hyperlipidemia, unspecified 03/20/2021   Acute gout due to renal impairment involving left wrist 04/24/2019   Malignant hypertension 10/14/2016   Combined congestive systolic and diastolic heart failure (HCC) 06/14/2016   NICM (nonischemic cardiomyopathy) (HCC) 06/14/2016  Renal failure    Hypertensive urgency 06/10/2016   CKD (chronic kidney disease), stage IV (HCC) 06/10/2016   Normocytic anemia 06/10/2016     ONSET DATE: 12/19/2023 referral date Admit date: 10/18/2023 Discharge date: 11/04/2023  REFERRING DIAG: G81.90 (ICD-10-CM) - Hemiplegia, unspecified affecting unspecified side  THERAPY DIAG:  Other lack of coordination  Hemiplegia and hemiparesis following other cerebrovascular disease affecting left non-dominant side  (HCC)  Muscle weakness (generalized)  Cerebrovascular accident (CVA) due to embolism of right middle cerebral artery (HCC)  Other symptoms and signs involving the musculoskeletal system  Rationale for Evaluation and Treatment: Rehabilitation  SUBJECTIVE:   SUBJECTIVE STATEMENT: Feeling okay today.  Pt accompanied by: self  PERTINENT HISTORY: Pt is a 48 yo male presenting to this clinic with hemiplegia s/p R MCA embolism. Pt was hospitalized for this from 10/07/23-10/18/23, then discharged to neurology and received IP PT/OT/SLP 10/18/23-11/04/23. Initial CT head revealed right MCA territory ischemic changes with calcific density in proximal right M1 concerning for calcific embolus.   CHF, CM, ESRD on HD TTS, HTN, obesity, HLD  PRECAUTIONS: Fall, L sided weakness, blood thinners  WEIGHT BEARING RESTRICTIONS: No  PAIN:  Are you having pain? No  FALLS: Has patient fallen in last 6 months? No  LIVING ENVIRONMENT: Lives with: lives with their family sister and her 2 sons. Pt reports will be getting own place soon. Lives in: House/apartment townhouse, 2 level Stairs: Yes: Internal: 3 steps; on right going up Has following equipment at home: None  PLOF: Independent and Vocation/Vocational requirements: Worked in distribution in [employer]   PATIENT GOALS: Get back movement in my left arm, return to work, and be able to play video games like I was before.  OBJECTIVE:  Note: Objective measures were completed at Evaluation unless otherwise noted.  HAND DOMINANCE: Right  ADLs: Overall ADLs: Independent Transfers/ambulation related to ADLs: Independent Eating: Independent Grooming: Independent UB Dressing: Independent LB Dressing: Independent Toileting: Independent Bathing: Independent Tub Shower transfers: Independent, shower/tub combo Equipment: none  IADLs: Shopping: Independent Light housekeeping: Mom is doing Pharmacologist.  Meal Prep: Needs some help with opening cans and  jars d/t weakness in L hand Community mobility: Mother driving pt to appts  Medication management: Mother helping with medication mgmt Financial management: Independent Handwriting: did not assess d/t pt report of no changes and pt dominant hand unaffected.  MOBILITY STATUS: Independent  POSTURE COMMENTS:  No Significant postural limitations Sitting balance: WFL  ACTIVITY TOLERANCE: Activity tolerance: No changes  FUNCTIONAL OUTCOME MEASURES: Quick Dash: 47.7/100: 47.7% impairment  UPPER EXTREMITY ROM:  R WNL  Active ROM Right eval Left eval  Shoulder flexion  92  Shoulder abduction  70  Shoulder adduction    Shoulder extension    Shoulder internal rotation    Shoulder external rotation    Elbow flexion    Elbow extension    Wrist flexion  10  Wrist extension  40  Wrist ulnar deviation    Wrist radial deviation    Wrist pronation    Wrist supination  Impaired (re-assess)  (Blank rows = not tested)  UPPER EXTREMITY MMT:   WFL RUE, 3-/5 grossly for LUE  MMT Right eval Left eval  Shoulder flexion    Shoulder abduction    Shoulder adduction    Shoulder extension    Shoulder internal rotation    Shoulder external rotation    Middle trapezius    Lower trapezius    Elbow flexion    Elbow extension  Wrist flexion    Wrist extension    Wrist ulnar deviation    Wrist radial deviation    Wrist pronation    Wrist supination    (Blank rows = not tested)  HAND FUNCTION: Grip strength: Right: 61.2 lbs; Left: 15.8 lbs  COORDINATION: 9 Hole Peg test: Right: 41.60 sec; Left: unable to pick up a peg in 2 minutes sec Box and Blocks:  Right 36 blocks, Left 11blocks  SENSATION: WFL  EDEMA: none  MUSCLE TONE: LUE: Rigidity  COGNITION: Overall cognitive status: Within functional limits for tasks assessed  VISION: Subjective report: no concerns Baseline vision: No visual deficits Visual history: no hx  VISION ASSESSMENT: WFL To be further assessed in  functional context  Patient has difficulty with following activities due to following visual impairments: none  PERCEPTION: WFL  PRAXIS: Impaired: Ideomotor  OBSERVATIONS: Weakness in L hand and limited ROM in LUE affecting ability to complete ADL/IADL.                                                                                                                              TREATMENT DATE: 12/23/23  Educated pt in purpose of OT and reviewed goals and POC with pt. Pt in agreement. Pt reports feelings of depression that has been present since stroke but has worsened since stroke. Provided handouts of mental health resources as well as educated pt in availability of local stroke support group via Stroke.org website. Handout provided of local support group including location, contacts, and time and dates of meetings. Pt verbalized understanding.       PATIENT EDUCATION: Education details: Educated on counseling services as well as local stroke support group resource. Person educated: Patient Education method: Explanation, Demonstration, and Handouts Education comprehension: verbalized understanding  HOME EXERCISE PROGRAM: To be established   GOALS: Goals reviewed with patient? Yes  SHORT TERM GOALS: Target date: 01/23/24  Pt will be independent with LUE ROM, coordination, and grip strengthening HEP Baseline: New to OP OT Goal status: INITIAL  2.  Pt will increase L shoulder abduction to at least 90 degrees for improved functional use  Baseline: 70 degrees  Goal status: INITIAL  3.  Pt will be independent with sleeping strategies for hemiparesis and hemiplegia of LUE Baseline: New to OP OT Goal status: INITIAL  4.  Pt will be independent with resting hand splint wear and care for night use and to improve functional use in L hand Baseline: New to OP OT Goal status: INITIAL    LONG TERM GOALS: Target date: 02/22/24  Pt will decrease QuickDASH score to no more than  25/100 to demonstrate improved function in daily tasks Baseline: 47.5/100 Goal status: INITIAL  2.  Pt will increase L shoulder flexion ROM to at least 120 degrees for improved functional use Baseline: 92 degrees Goal status: INITIAL  3.  Pt will increase L shoulder abduction ROM to at least 100 degrees for improved functional use  Baseline: 70 degrees Goal status: INITIAL  4.  Pt will increase L wrist flexion and extension by at least 40 degrees and 20 degrees, respectively. Baseline: 10 degrees wrist flexion, 40 degrees wrist extension Goal status: INITIAL   ASSESSMENT:  CLINICAL IMPRESSION: Patient is a 48 y.o. male who was seen today for occupational therapy evaluation for s/p CVA and hemiplegia. Hx includes CHF, CM, ESRD on HD TTS, HTN, obesity, HLD. Patient currently presents below baseline level of functioning demonstrating functional deficits and impairments as noted below. Pt would benefit from skilled OT services in the outpatient setting to work on impairments as noted below to help pt return to PLOF as able.     PERFORMANCE DEFICITS: in functional skills including ADLs, IADLs, coordination, dexterity, tone, ROM, strength, pain, Fine motor control, body mechanics, cardiopulmonary status limiting function, and UE functional use, cognitive skills including attention, safety awareness, and sequencing, and psychosocial skills including coping strategies, interpersonal interactions, and routines and behaviors.   IMPAIRMENTS: are limiting patient from IADLs, rest and sleep, work, and leisure.   CO-MORBIDITIES: has co-morbidities such as ESRD that affects occupational performance. Patient will benefit from skilled OT to address above impairments and improve overall function.  MODIFICATION OR ASSISTANCE TO COMPLETE EVALUATION: Min-Moderate modification of tasks or assist with assess necessary to complete an evaluation.  OT OCCUPATIONAL PROFILE AND HISTORY: Detailed assessment: Review  of records and additional review of physical, cognitive, psychosocial history related to current functional performance.  CLINICAL DECISION MAKING: Moderate - several treatment options, min-mod task modification necessary  REHAB POTENTIAL: Good  EVALUATION COMPLEXITY: Moderate    PLAN:  OT FREQUENCY: 2x/week  OT DURATION: 8 weeks  PLANNED INTERVENTIONS: 97168 OT Re-evaluation, 97535 self care/ADL training, [id number] therapeutic exercise, 97530 therapeutic activity, 97112 neuromuscular re-education, 97140 manual therapy, 97032 electrical stimulation (manual), 97760 Orthotic Initial, [id number] Orthotic/Prosthetic subsequent, passive range of motion, functional mobility training, visual/perceptual remediation/compensation, energy conservation, patient/family education, and DME and/or AE instructions  RECOMMENDED OTHER SERVICES: none noted  CONSULTED AND AGREED WITH PLAN OF CARE: Patient  PLAN FOR NEXT SESSION: Shoulder PROM HEP with washcloth/AAROM/supine  Forearm/wrist ROM Further assess vision    Rocky Dutch, OT 12/23/2023, 10:39 AM

## 2023-12-26 ENCOUNTER — Ambulatory Visit: Admitting: Occupational Therapy

## 2023-12-26 ENCOUNTER — Encounter: Payer: Self-pay | Admitting: Physical Therapy

## 2023-12-26 ENCOUNTER — Ambulatory Visit: Admitting: Physical Therapy

## 2023-12-26 VITALS — BP 146/89 | HR 92

## 2023-12-26 DIAGNOSIS — I63411 Cerebral infarction due to embolism of right middle cerebral artery: Secondary | ICD-10-CM | POA: Diagnosis not present

## 2023-12-26 DIAGNOSIS — R2689 Other abnormalities of gait and mobility: Secondary | ICD-10-CM

## 2023-12-26 NOTE — Therapy (Signed)
 OUTPATIENT PHYSICAL THERAPY NEURO TREATMENT   Patient Name: Nathan Campbell MRN: [id number] DOB:07-19-1975, 48 y.o., male Today's Date: 12/26/2023   PCP: Pt reports PCP is Blad but did not give any further identifying information. REFERRING PROVIDER: Sebastian Jerilyn HERO, FNP  END OF SESSION:  PT End of Session - 12/26/23 0849     Visit Number 2    Number of Visits 17   16 plus Eval   Date for Recertification  02/24/24   For scheduling delays   Authorization Type [facility] Medicare    Authorization Time Period Pending    PT Start Time 0848    PT Stop Time 0930    PT Time Calculation (min) 42 min    Equipment Utilized During Treatment Gait belt    Activity Tolerance Patient tolerated treatment well    Behavior During Therapy WFL for tasks assessed/performed          Past Medical History:  Diagnosis Date   Acute combined systolic and diastolic CHF, NYHA class 4 (HCC) 06/10/2016   Anemia    Cardiomyopathy (HCC) 06/14/2016   CHF (congestive heart failure) (HCC)    COVID 10/2020   Dyspnea    ESRD on hemodialysis (HCC)    TTS at [facility]   Hypertension    Hypertensive heart and kidney disease with heart failure (HCC) 06/14/2016   Hypertensive heart disease with congestive heart failure (HCC)    Obesity    Past Surgical History:  Procedure Laterality Date   A/V FISTULAGRAM Left 05/03/2022   Procedure: A/V Fistulagram;  Surgeon: Nathan Campbell RAMAN, Campbell;  Location: [facility] INVASIVE CV LAB;  Service: Cardiovascular;  Laterality: Left;   AV FISTULA PLACEMENT Left 11/11/2020   Procedure: LEFT ARM First Stage Basilic Vein Fistula Creation;  Surgeon: Nathan Penne Lonni, Campbell;  Location: [facility] OR;  Service: Vascular;  Laterality: Left;   BASCILIC VEIN TRANSPOSITION Left 01/23/2021   Procedure: Second Stage Basilic Vein Transposition of Left Arm Arteriovenous  Fistula;  Surgeon: Nathan Penne Lonni, Campbell;  Location: [facility] OR;  Service: Vascular;  Laterality: Left;  Block  to LMA     COLONOSCOPY     FISTULA SUPERFICIALIZATION Left 05/07/2022   Procedure: PLICATION OF ANEURYSM, LEFT ARM FISTULA;  Surgeon: Nathan Campbell RAMAN, Campbell;  Location: [facility] OR;  Service: Vascular;  Laterality: Left;   IR CT HEAD LTD  10/07/2023   IR CT HEAD LTD  10/07/2023   IR PATIENT EVAL TECH 0-60 MINS  10/07/2023   IR PERCUTANEOUS ART THROMBECTOMY/INFUSION INTRACRANIAL INC DIAG ANGIO  10/07/2023   NO PAST SURGERIES     PERIPHERAL VASCULAR BALLOON ANGIOPLASTY Left 05/03/2022   Procedure: PERIPHERAL VASCULAR BALLOON ANGIOPLASTY;  Surgeon: Nathan Campbell RAMAN, Campbell;  Location: [facility] INVASIVE CV LAB;  Service: Cardiovascular;  Laterality: Left;  AVF   RADIOLOGY WITH ANESTHESIA N/A 10/07/2023   Procedure: RADIOLOGY WITH ANESTHESIA;  Surgeon: Nathan Campbell;  Location: [facility] OR;  Service: Radiology;  Laterality: N/A;   Patient Active Problem List   Diagnosis Date Noted   Stroke (HCC) 11/04/2023   Cognitive and neurobehavioral dysfunction 10/24/2023   Acute hypoxic respiratory failure (HCC) 10/17/2023   Essential hypertension 10/17/2023   Dysphagia 10/17/2023   Leukocytosis 10/17/2023   Obesity 10/17/2023   Acute ischemic stroke (HCC) 10/07/2023   Middle cerebral artery embolism, right 10/07/2023   ESRD (end stage renal disease) (HCC) 03/26/2022   Hyperlipidemia, unspecified 03/20/2021   Acute gout due to renal impairment involving left wrist 04/24/2019   Malignant hypertension 10/14/2016  Combined congestive systolic and diastolic heart failure (HCC) 06/14/2016   NICM (nonischemic cardiomyopathy) (HCC) 06/14/2016   Renal failure    Hypertensive urgency 06/10/2016   CKD (chronic kidney disease), stage IV (HCC) 06/10/2016   Normocytic anemia 06/10/2016    ONSET DATE: 12/14/2023- date of referral  REFERRING DIAG: G81.90 (ICD-10-CM) - Hemiparesis (HCC)  THERAPY DIAG:  Cerebrovascular accident (CVA) due to embolism of right middle cerebral artery (HCC)  Other abnormalities of gait  and mobility  Rationale for Evaluation and Treatment: Rehabilitation  SUBJECTIVE:  Likes to be called Nathan Campbell.                                                                                                                                                                                            SUBJECTIVE STATEMENT: No changes or falls since last visit.  Went back to church for first time on Sunday and went well (better than he thought).  Has BP monitor at home but hasn't been checking it.  Harder with R>L SLS at home with HEP (feels it is strange because symptoms were on L  side). Pt accompanied by: self.  Mom drove pt and was in waiting room.  PERTINENT HISTORY: Hospitalization 10/07/23 for acute onset of left-sided weakness, left side neglect, dysarthria and right gaze preference; imaging showing acute right MCA CVA with right M1 occlusion (intubated for airway protection; s/p TNK; s/p revascularization of R MCA with IR 7/25).  IP rehab stay 10/18/23-11/04/23. Urgent care visit 12/02/23 for gout (L foot).  PMH includes: gout, CKD, ESRD on HD T/R/Sat, CHF, anemia, htn, stroke 10/07/23, acute hypoxic respiratory failure, dysphagia, cardiomyopathy, and obesity (BMI 41).  PAIN:  Are you having pain? Yes: NPRS scale: 0/10 currently Pain location: L shoulder Pain description: Aching Aggravating factors: Laying down Relieving factors: Muscle relaxer  PRECAUTIONS: Fall  RED FLAGS: None   WEIGHT BEARING RESTRICTIONS: No  FALLS: Has patient fallen in last 6 months? No  LIVING ENVIRONMENT: Lives with: Sister and her 2 sons; mom currently staying with pt to assist as needed Lives in: Other Townhouse--stays on 2nd floor Stairs: Yes: Internal: 5 steps; on right going up and External: 0 steps; none Has following equipment at home: Crutches  PLOF: Independent  PATIENT GOALS: To get more function in arm; get voice back; get strength up.  OBJECTIVE:  Note: Objective measures were completed at  Evaluation unless otherwise noted.  COGNITION: Overall cognitive status: A&Ox4; possible decreased awareness of deficits  COORDINATION:  Rapid Alternating toe taps (in sitting): Decreased L LE  Heel to shin (in sitting); mild decreased L LE  LOWER EXTREMITY ROM:  Active  Right Eval Left Eval  Hip flexion WNL WNL  Hip extension    Hip abduction    Hip adduction    Hip internal rotation    Hip external rotation    Knee flexion WNL WNL  Knee extension WNL WNL  Ankle dorsiflexion WNL WNL  Ankle plantarflexion WNL WNL  Ankle inversion    Ankle eversion     (Blank rows = not tested)  LOWER EXTREMITY MMT:    MMT Right Eval Left Eval  Hip flexion 5/5 4+/5  Hip extension    Hip abduction    Hip adduction    Hip internal rotation    Hip external rotation    Knee flexion 5/5 4+/5  Knee extension 5/5 5/5  Ankle dorsiflexion 5/5 4+/5  Ankle plantarflexion At least 3/5 AROM At least 3/5 AROM  Ankle inversion    Ankle eversion    (Blank rows = not tested) GAIT: Findings: Gait Characteristics: step through pattern, decreased arm swing- Left, decreased ankle dorsiflexion- Left, and wide BOS, Distance walked: clinic distances, Assistive device utilized:None, Level of assistance: SBA, and Comments: pt tending to walk close to items on L side but did not bump into anything  FUNCTIONAL TESTS:  5 times sit to stand: 20.81 seconds 10 meter walk test: 1.25 m/sec (7.97 seconds); no AD use Functional gait assessment: 18/30 (12/26/23) SLS: R LE 1st 2 trials about 1 second each but on 3rd trial 9.0 seconds; L LE 1st 2 trials about 1 second each but on 3rd trial 30 seconds L LE (time stopped by therapist at 30 seconds) TUG (Manual): 9.41 seconds 12/26/23 TUG (Cognitive): 26.25 seconds 12/26/23  FUNCTIONAL GAIT ASSESSMENT  Date: 12/26/23 Score  GAIT LEVEL SURFACE Instructions: Walk at your normal speed from here to the next mark (6 m) [20 ft]. (1) Moderate impairment-Walks 6 m (20  ft), slow speed, abnormal gait pattern, evidence for imbalance, or deviates 25.4 - 38.1 cm (10 -15 in) outside of the 30.48-cm (12-in) walkway width. Requires more than 7 seconds to ambulate 6 m (20 ft). 8.53 seconds  2.   CHANGE IN GAIT SPEED Instructions: Begin walking at your normal pace (for 1.5 m [5 ft]). When I tell you "go," walk as fast as you can (for 1.5 m [5 ft]). When I tell you "slow," walk as slowly as you can (for 1.5 m [5 ft]. (2) Mild impairment - Is able to change speed but demonstrates mild gait deviations, deviates 15.24 -25.4 cm (6 -10 in) outside of the 30.48-cm (12-in) walkway width, or no gait deviations but unable to achieve a significant change in velocity, or uses an assistive device  3.    GAIT WITH HORIZONTAL HEAD TURNS Instructions: Walk from here to the next mark 6 m (20 ft) away. Begin walking at your normal pace. Keep walking straight; after 3 steps, turn your head to the right and keep walking straight while looking to the right. After 3 more steps, turn your head to the left and keep walking straight while looking left. Continue alternating looking right and left. (2) Mild impairment - Performs head turns smoothly with slight change in gait velocity (eg, minor disruption to smooth gait path), deviates 15.24 -25.4 cm (6 -10 in) outside 30.48-cm (12-in) walkway width, or uses an assistive device.  4.   GAIT WITH VERTICAL HEAD TURNS Instructions: Walk from here to the next mark (6 m [20 ft]). Begin walking at your normal pace. Keep walking straight; after 3 steps,  tip your head up and keep walking straight while looking up. After 3 more steps, tip your head down, keep walking straight while looking down. Continue  alternating looking up and down every 3 steps until you have completed 2 repetitions in each direction. (2) Mild impairment - Performs task with slight change in gait velocity (eg, minor disruption to smooth gait path), deviates 15.24 -25.4 cm (6 -10 in) outside  30.48-cm (12-in) walkway width or uses assistive device.  5.  GAIT AND PIVOT TURN Instructions: Begin with walking at your normal pace. When I tell you, "turn and stop," turn as quickly as you can to face the opposite direction and stop. (2) Mild impairment - Pivot turns safely in 3 seconds and stops with no loss of balance, or pivot turns safely within 3 seconds and stops with mild imbalance, requires small steps to catch balance  6.   STEP OVER OBSTACLE Instructions: Begin walking at your normal speed. When you come to the shoe box, step over it, not around it, and keep walking. (2) Mild impairment - Is able to step over one shoe box (11.43 cm [4.5 in] total height) without changing gait speed; no evidence of imbalance.  7.   GAIT WITH NARROW BASE OF SUPPORT Instructions: Walk on the floor with arms folded across the chest, feet aligned heel to toe in tandem for a distance of 3.6 m [12 ft]. The number of steps taken in a straight line are counted for a maximum of 10 steps. (2) Mild impairment - Ambulates 7-9 steps  8.   GAIT WITH EYES CLOSED Instructions: Walk at your normal speed from here to the next mark (6 m [20 ft]) with your eyes closed. (2) Mild impairment - Walks 6 m (20 ft), uses assistive device, slower speed, mild gait deviations, deviates 15.24 -25.4 cm (6 -10 in) outside 30.48-cm (12-in) walkway width. Ambulates 6 m (20 ft) in less than 9 seconds but greater than 7 seconds 7.72 seconds  9.   AMBULATING BACKWARDS Instructions: Walk backwards until I tell you to stop (2) Mild impairment - Walks 6 m (20 ft), uses assistive device, slower speed, mild gait deviations, deviates 15.24 -25.4 cm (6 -10 in) outside 30.48-cm (12-in) walkway width12.06 seconds  10. STEPS Instructions: Walk up these stairs as you would at home (ie, using the rail if necessary). At the top turn around and walk down. (1) Moderate impairment-Two feet to a stair; must use rail.  Total 18/30   Interpretation of  scores: Non-Specific Older Adults Cutoff Score: <=22/30 = risk of falls Parkinson's Disease Cutoff score <15/30= fall risk (Hoehn & Yahr 1-4)  Minimally Clinically Important Difference (MCID)  Stroke (acute, subacute, and chronic) = MDC: 4.2 points Vestibular (acute) = MDC: 6 points Community Dwelling Older Adults =  MCID: 4 points Parkinson's Disease  =  MDC: 4.3 points  (Academy of Neurologic Physical Therapy (nd). Functional Gait Assessment. Retrieved from https://www.neuropt.org/docs/default-source/cpgs/core-outcome-measures/function-gait-assessment-pocket-guide-proof9-(2).pdf?sfvrsn=b6f35043_0.)   PATIENT SURVEYS:  Stroke Impact Scale : TBA  TREATMENT DATE: 12/26/23  Self Care:  BP and HR taken in sitting at rest beginning of session Vitals:   12/26/23 0854  BP: (!) 146/89  Pulse: 92   Therapeutic Activities:  FGA (see above for details) TUG (Regular): 9.41 seconds TUG (Cognitive): 26.25 seconds (pt had significant difficulty with counting backwards in general; after trialing different options, performed test counting up by 2's starting at 0; significant increased difficulty noted with counting up by 2's during test)  Standing // Bars:  Wobble board (lateral) static standing balance: 1st trial x10 seconds; 2nd trial x10 seconds; 3rd trial x12 seconds; up to 1-2 seconds next 3 trials (4-6 trials) so pt given standing 2 minute rest break.  7th trial x20 seconds; 8th trial x15 seconds; 9th trial x10 seconds; 10th trial x20 seconds.  No UE support.  Loss of balance to R>L (CGA to min assist for balance/safety). Standing toe taps B LE's to 6 inch cone; no UE support; x10 reps  12/23/23  BP and HR taken in sitting at rest (see below for details). Vitals:   12/26/23 0854  BP: (!) 146/89  Pulse: 92    Therapeutic Exercise Pt issued written HEP (see  below for details). HEP performed in clinic: - Standing March with Counter Support: x10 reps B LE's - Standing Hip Abduction with Counter Support: x10 reps B LE's (decreased quality noted with L LE) - Standing Single Leg Stance with Counter Support x1 reps B LE's with 30 second hold  PATIENT EDUCATION: 12/26/23 Education details: FGA and TUG results; reviewed HEP Person educated: Patient Education method: Explanation, Demonstration, and Verbal cues Education comprehension: verbalized understanding and returned demonstration  HOME EXERCISE PROGRAM: 12/23/23 Access Code: [email or web address] URL: [email or web address] Date: 12/23/2023 Prepared by: Damien Caulk  Exercises - Standing March with Counter Support  - 1 x daily - 5 x weekly - 2 sets - 10 reps - Standing Hip Abduction with Counter Support  - 1 x daily - 5 x weekly - 2 sets - 10 reps - Standing Single Leg Stance with Counter Support  - 1 x daily - 5 x weekly - 2 sets - 1 reps - 30 second hold  GOALS: Goals reviewed with patient? Yes  SHORT TERM GOALS: Target date: 01/20/2024  Patient will demo at least 50% compliance with HEP to self manage symptoms.  Baseline: Goal status: INITIAL  2.  Assess FGA Baseline: 18/30 (12/26/23) Goal status: MET  3.  Assess TUG (Cognitive) Baseline: 26.25 seconds Goal status: MET   LONG TERM GOALS: Target date: 02/17/2024   Pt will decrease 5 Time Sit to Stand by at least 3 seconds in order to demonstrate clinically significant improvement in LE strength.  Baseline: 20.81 seconds (Eval) Goal status: INITIAL  2.  Pt will decrease TUG (Cognitive) by at least (or equal to) 3 seconds in order to demonstrate decreased fall risk. Baseline: 26.25 seconds  Goal status: INITIAL  3.  Patient will demo at least 5 points of improvement on FGA to improve functional balance and reduce fall risk. Baseline: 18/30 (12/26/23) Goal status: INITIAL  4.  Pt will be independent with final HEP in  order to improve strength and balance, to decrease fall risk, and improve function at home for ADL's and at work. Baseline:  Goal status: INITIAL   ASSESSMENT:  CLINICAL IMPRESSION: Patient was seen today for physical therapy treatment to address weakness, balance, and functional impairments.  Focused session on FGA, TUG, and balance activities (SLS; wobble board).  Pt  scored 18/30 on FGA indicating pt is at increased risk of falls.  Pt scored 26.25 seconds on the TUG (cognitive) vs  9.41 seconds on TUG (regular) indicating pt is a fall risk.  Patient continues to be limited by balance, weakness, and functional impairments.  No L inattention noted with tasks today but will continue to monitor.  They would continue to benefit from skilled PT to address impairments as noted and progress towards long term goals  OBJECTIVE IMPAIRMENTS: Abnormal gait, cardiopulmonary status limiting activity, decreased activity tolerance, decreased balance, decreased cognition, decreased coordination, decreased endurance, decreased knowledge of condition, decreased knowledge of use of DME, decreased mobility, difficulty walking, decreased strength, decreased safety awareness, increased fascial restrictions, impaired flexibility, impaired sensation, impaired tone, impaired UE functional use, impaired vision/preception, improper body mechanics, and pain.   ACTIVITY LIMITATIONS: carrying, lifting, bending, standing, squatting, sleeping, stairs, transfers, locomotion level, and caring for others  PARTICIPATION LIMITATIONS: meal prep, cleaning, laundry, medication management, driving, shopping, community activity, occupation, and yard work  PERSONAL FACTORS: Age, Time since onset of injury/illness/exacerbation, and 1-2 comorbidities: CHF, hx of CVA, ESRD are also affecting patient's functional outcome.   REHAB POTENTIAL: Good  CLINICAL DECISION MAKING: Evolving/moderate complexity  EVALUATION COMPLEXITY:  Moderate  PLAN:  PT FREQUENCY: 2x/week  PT DURATION: 8 weeks  PLANNED INTERVENTIONS: [id number]- PT Re-evaluation, 97750- Physical Performance Testing, 97110-Therapeutic exercises, 97530- Therapeutic activity, 97112- Neuromuscular re-education, 97535- Self Care, [id number]- Manual therapy, [phone]- Gait training, [phone]- Orthotic/Prosthetic subsequent, [phone]- Aquatic Therapy, [phone]- Electrical stimulation (unattended), Patient/Family education, Balance training, Stair training, Taping, Joint mobilization, Spinal mobilization, Cognitive remediation, DME instructions, Wheelchair mobility training, Cryotherapy, and Moist heat  PLAN FOR NEXT SESSION: SLP referral?; higher level balance with cognitive tasks/dual tasks; BlazePods; challenge awareness of L side   Damien Caulk, PT 12/26/2023, 5:19 PM

## 2023-12-27 ENCOUNTER — Telehealth: Payer: Self-pay | Admitting: Physical Therapy

## 2023-12-27 NOTE — Telephone Encounter (Signed)
 Dear Jerilyn Hummer, FNP,  Nathan Campbell was evaluated by Physical Therapy on 12/23/23.  The patient would benefit from SLP evaluation for difficulty with speech.   If you agree, please place an order in [facility]que in [facility] or fax the order to [phone]. Thank you, Damien Caulk, PT  [facility] [address] [place], [place]  [address] Phone:  [phone] Fax:  [phone]

## 2023-12-28 ENCOUNTER — Ambulatory Visit (HOSPITAL_COMMUNITY)
Admission: RE | Admit: 2023-12-28 | Discharge: 2023-12-28 | Disposition: A | Discharge status: Home / Self Care | Source: Ambulatory Visit | Attending: Nephrology | Admitting: Nephrology

## 2023-12-28 ENCOUNTER — Encounter (HOSPITAL_COMMUNITY): Payer: Self-pay

## 2023-12-28 DIAGNOSIS — R3915 Urgency of urination: Secondary | ICD-10-CM

## 2023-12-30 ENCOUNTER — Ambulatory Visit

## 2023-12-30 ENCOUNTER — Ambulatory Visit: Admitting: Physical Therapy

## 2023-12-30 VITALS — BP 141/95 | HR 109

## 2023-12-30 DIAGNOSIS — R278 Other lack of coordination: Secondary | ICD-10-CM

## 2023-12-30 DIAGNOSIS — M6281 Muscle weakness (generalized): Secondary | ICD-10-CM

## 2023-12-30 DIAGNOSIS — R2689 Other abnormalities of gait and mobility: Secondary | ICD-10-CM

## 2023-12-30 DIAGNOSIS — R29898 Other symptoms and signs involving the musculoskeletal system: Secondary | ICD-10-CM

## 2023-12-30 DIAGNOSIS — I63411 Cerebral infarction due to embolism of right middle cerebral artery: Secondary | ICD-10-CM | POA: Diagnosis not present

## 2023-12-30 DIAGNOSIS — R41844 Frontal lobe and executive function deficit: Secondary | ICD-10-CM

## 2023-12-30 NOTE — Therapy (Signed)
 OUTPATIENT PHYSICAL THERAPY NEURO TREATMENT   Patient Name: Nathan Campbell MRN: [id number] DOB:1975-06-01, 48 y.o., male Today's Date: 12/30/2023   PCP: Pt reports PCP is Blad but did not give any further identifying information. REFERRING PROVIDER: Sebastian Jerilyn HERO, FNP  END OF SESSION:  PT End of Session - 12/30/23 1452     Visit Number 3    Number of Visits 17   16 plus Eval   Date for Recertification  02/24/24   For scheduling delays   Authorization Type [facility] Medicare    Authorization Time Period Pending    PT Start Time 1450   pt arrived late   PT Stop Time 1535    PT Time Calculation (min) 45 min    Equipment Utilized During Treatment Gait belt    Activity Tolerance Patient tolerated treatment well    Behavior During Therapy WFL for tasks assessed/performed          Past Medical History:  Diagnosis Date   Acute combined systolic and diastolic CHF, NYHA class 4 (HCC) 06/10/2016   Anemia    Cardiomyopathy (HCC) 06/14/2016   CHF (congestive heart failure) (HCC)    COVID 10/2020   Dyspnea    ESRD on hemodialysis (HCC)    TTS at [facility]   Hypertension    Hypertensive heart and kidney disease with heart failure (HCC) 06/14/2016   Hypertensive heart disease with congestive heart failure (HCC)    Obesity    Past Surgical History:  Procedure Laterality Date   A/V FISTULAGRAM Left 05/03/2022   Procedure: A/V Fistulagram;  Surgeon: Nathan Campbell RAMAN, MD;  Location: [facility] INVASIVE CV LAB;  Service: Cardiovascular;  Laterality: Left;   AV FISTULA PLACEMENT Left 11/11/2020   Procedure: LEFT ARM First Stage Basilic Vein Fistula Creation;  Surgeon: Nathan Penne Lonni, MD;  Location: [facility] OR;  Service: Vascular;  Laterality: Left;   BASCILIC VEIN TRANSPOSITION Left 01/23/2021   Procedure: Second Stage Basilic Vein Transposition of Left Arm Arteriovenous  Fistula;  Surgeon: Nathan Penne Lonni, MD;  Location: [facility] OR;  Service: Vascular;  Laterality: Left;   Block  to LMA    COLONOSCOPY     FISTULA SUPERFICIALIZATION Left 05/07/2022   Procedure: PLICATION OF ANEURYSM, LEFT ARM FISTULA;  Surgeon: Nathan Campbell RAMAN, MD;  Location: [facility] OR;  Service: Vascular;  Laterality: Left;   IR CT HEAD LTD  10/07/2023   IR CT HEAD LTD  10/07/2023   IR PATIENT EVAL TECH 0-60 MINS  10/07/2023   IR PERCUTANEOUS ART THROMBECTOMY/INFUSION INTRACRANIAL INC DIAG ANGIO  10/07/2023   NO PAST SURGERIES     PERIPHERAL VASCULAR BALLOON ANGIOPLASTY Left 05/03/2022   Procedure: PERIPHERAL VASCULAR BALLOON ANGIOPLASTY;  Surgeon: Nathan Campbell RAMAN, MD;  Location: [facility] INVASIVE CV LAB;  Service: Cardiovascular;  Laterality: Left;  AVF   RADIOLOGY WITH ANESTHESIA N/A 10/07/2023   Procedure: RADIOLOGY WITH ANESTHESIA;  Surgeon: Radiologist, Medication, MD;  Location: [facility] OR;  Service: Radiology;  Laterality: N/A;   Patient Active Problem List   Diagnosis Date Noted   Stroke (HCC) 11/04/2023   Cognitive and neurobehavioral dysfunction 10/24/2023   Acute hypoxic respiratory failure (HCC) 10/17/2023   Essential hypertension 10/17/2023   Dysphagia 10/17/2023   Leukocytosis 10/17/2023   Obesity 10/17/2023   Acute ischemic stroke (HCC) 10/07/2023   Middle cerebral artery embolism, right 10/07/2023   ESRD (end stage renal disease) (HCC) 03/26/2022   Hyperlipidemia, unspecified 03/20/2021   Acute gout due to renal impairment involving left wrist 04/24/2019  Malignant hypertension 10/14/2016   Combined congestive systolic and diastolic heart failure (HCC) 06/14/2016   NICM (nonischemic cardiomyopathy) (HCC) 06/14/2016   Renal failure    Hypertensive urgency 06/10/2016   CKD (chronic kidney disease), stage IV (HCC) 06/10/2016   Normocytic anemia 06/10/2016    ONSET DATE: 12/14/2023- date of referral  REFERRING DIAG: G81.90 (ICD-10-CM) - Hemiparesis (HCC)  THERAPY DIAG:  Cerebrovascular accident (CVA) due to embolism of right middle cerebral artery (HCC)  Other  abnormalities of gait and mobility  Other lack of coordination  Muscle weakness (generalized)  Other symptoms and signs involving the musculoskeletal system  Rationale for Evaluation and Treatment: Rehabilitation  SUBJECTIVE:  Likes to be called LC.                                                                                                                                                                                            SUBJECTIVE STATEMENT: Pt denies any falls or acute changes since last visit. Pt denies any pain.  Pt goes to dialysis T/Th/Sat, fistula in LUE.  Pt accompanied by: self.  Mom drove pt and was in waiting room.  PERTINENT HISTORY: Hospitalization 10/07/23 for acute onset of left-sided weakness, left side neglect, dysarthria and right gaze preference; imaging showing acute right MCA CVA with right M1 occlusion (intubated for airway protection; s/p TNK; s/p revascularization of R MCA with IR 7/25).  IP rehab stay 10/18/23-11/04/23. Urgent care visit 12/02/23 for gout (L foot).  PMH includes: gout, CKD, ESRD on HD T/R/Sat, CHF, anemia, htn, stroke 10/07/23, acute hypoxic respiratory failure, dysphagia, cardiomyopathy, and obesity (BMI 41).  PAIN:  Are you having pain? Yes: NPRS scale: 0/10 currently Pain location: L shoulder Pain description: Aching Aggravating factors: Laying down Relieving factors: Muscle relaxer  PRECAUTIONS: Fall  RED FLAGS: None   WEIGHT BEARING RESTRICTIONS: No  FALLS: Has patient fallen in last 6 months? No  LIVING ENVIRONMENT: Lives with: Sister and her 2 sons; mom currently staying with pt to assist as needed Lives in: Other Townhouse--stays on 2nd floor Stairs: Yes: Internal: 5 steps; on right going up and External: 0 steps; none Has following equipment at home: Crutches  PLOF: Independent  PATIENT GOALS: To get more function in arm; get voice back; get strength up.  OBJECTIVE:  Note: Objective measures were completed at  Evaluation unless otherwise noted.  COGNITION: Overall cognitive status: A&Ox4; possible decreased awareness of deficits  COORDINATION:  Rapid Alternating toe taps (in sitting): Decreased L LE  Heel to shin (in sitting); mild decreased L LE  LOWER EXTREMITY ROM:     Active  Right Eval Left  Eval  Hip flexion WNL WNL  Hip extension    Hip abduction    Hip adduction    Hip internal rotation    Hip external rotation    Knee flexion WNL WNL  Knee extension WNL WNL  Ankle dorsiflexion WNL WNL  Ankle plantarflexion WNL WNL  Ankle inversion    Ankle eversion     (Blank rows = not tested)  LOWER EXTREMITY MMT:    MMT Right Eval Left Eval  Hip flexion 5/5 4+/5  Hip extension    Hip abduction    Hip adduction    Hip internal rotation    Hip external rotation    Knee flexion 5/5 4+/5  Knee extension 5/5 5/5  Ankle dorsiflexion 5/5 4+/5  Ankle plantarflexion At least 3/5 AROM At least 3/5 AROM  Ankle inversion    Ankle eversion    (Blank rows = not tested) GAIT: Findings: Gait Characteristics: step through pattern, decreased arm swing- Left, decreased ankle dorsiflexion- Left, and wide BOS, Distance walked: clinic distances, Assistive device utilized:None, Level of assistance: SBA, and Comments: pt tending to walk close to items on L side but did not bump into anything  FUNCTIONAL TESTS:  5 times sit to stand: 20.81 seconds 10 meter walk test: 1.25 m/sec (7.97 seconds); no AD use Functional gait assessment: 18/30 (12/26/23) SLS: R LE 1st 2 trials about 1 second each but on 3rd trial 9.0 seconds; L LE 1st 2 trials about 1 second each but on 3rd trial 30 seconds L LE (time stopped by therapist at 30 seconds) TUG (Manual): 9.41 seconds 12/26/23 TUG (Cognitive): 26.25 seconds 12/26/23  FUNCTIONAL GAIT ASSESSMENT  Date: 12/26/23 Score  GAIT LEVEL SURFACE Instructions: Walk at your normal speed from here to the next mark (6 m) [20 ft]. (1) Moderate impairment-Walks 6 m (20  ft), slow speed, abnormal gait pattern, evidence for imbalance, or deviates 25.4 - 38.1 cm (10 -15 in) outside of the 30.48-cm (12-in) walkway width. Requires more than 7 seconds to ambulate 6 m (20 ft). 8.53 seconds  2.   CHANGE IN GAIT SPEED Instructions: Begin walking at your normal pace (for 1.5 m [5 ft]). When I tell you "go," walk as fast as you can (for 1.5 m [5 ft]). When I tell you "slow," walk as slowly as you can (for 1.5 m [5 ft]. (2) Mild impairment - Is able to change speed but demonstrates mild gait deviations, deviates 15.24 -25.4 cm (6 -10 in) outside of the 30.48-cm (12-in) walkway width, or no gait deviations but unable to achieve a significant change in velocity, or uses an assistive device  3.    GAIT WITH HORIZONTAL HEAD TURNS Instructions: Walk from here to the next mark 6 m (20 ft) away. Begin walking at your normal pace. Keep walking straight; after 3 steps, turn your head to the right and keep walking straight while looking to the right. After 3 more steps, turn your head to the left and keep walking straight while looking left. Continue alternating looking right and left. (2) Mild impairment - Performs head turns smoothly with slight change in gait velocity (eg, minor disruption to smooth gait path), deviates 15.24 -25.4 cm (6 -10 in) outside 30.48-cm (12-in) walkway width, or uses an assistive device.  4.   GAIT WITH VERTICAL HEAD TURNS Instructions: Walk from here to the next mark (6 m [20 ft]). Begin walking at your normal pace. Keep walking straight; after 3 steps, tip your head up and  keep walking straight while looking up. After 3 more steps, tip your head down, keep walking straight while looking down. Continue  alternating looking up and down every 3 steps until you have completed 2 repetitions in each direction. (2) Mild impairment - Performs task with slight change in gait velocity (eg, minor disruption to smooth gait path), deviates 15.24 -25.4 cm (6 -10 in) outside  30.48-cm (12-in) walkway width or uses assistive device.  5.  GAIT AND PIVOT TURN Instructions: Begin with walking at your normal pace. When I tell you, "turn and stop," turn as quickly as you can to face the opposite direction and stop. (2) Mild impairment - Pivot turns safely in 3 seconds and stops with no loss of balance, or pivot turns safely within 3 seconds and stops with mild imbalance, requires small steps to catch balance  6.   STEP OVER OBSTACLE Instructions: Begin walking at your normal speed. When you come to the shoe box, step over it, not around it, and keep walking. (2) Mild impairment - Is able to step over one shoe box (11.43 cm [4.5 in] total height) without changing gait speed; no evidence of imbalance.  7.   GAIT WITH NARROW BASE OF SUPPORT Instructions: Walk on the floor with arms folded across the chest, feet aligned heel to toe in tandem for a distance of 3.6 m [12 ft]. The number of steps taken in a straight line are counted for a maximum of 10 steps. (2) Mild impairment - Ambulates 7-9 steps  8.   GAIT WITH EYES CLOSED Instructions: Walk at your normal speed from here to the next mark (6 m [20 ft]) with your eyes closed. (2) Mild impairment - Walks 6 m (20 ft), uses assistive device, slower speed, mild gait deviations, deviates 15.24 -25.4 cm (6 -10 in) outside 30.48-cm (12-in) walkway width. Ambulates 6 m (20 ft) in less than 9 seconds but greater than 7 seconds 7.72 seconds  9.   AMBULATING BACKWARDS Instructions: Walk backwards until I tell you to stop (2) Mild impairment - Walks 6 m (20 ft), uses assistive device, slower speed, mild gait deviations, deviates 15.24 -25.4 cm (6 -10 in) outside 30.48-cm (12-in) walkway width12.06 seconds  10. STEPS Instructions: Walk up these stairs as you would at home (ie, using the rail if necessary). At the top turn around and walk down. (1) Moderate impairment-Two feet to a stair; must use rail.  Total 18/30   Interpretation of  scores: Non-Specific Older Adults Cutoff Score: <=22/30 = risk of falls Parkinson's Disease Cutoff score <15/30= fall risk (Hoehn & Yahr 1-4)  Minimally Clinically Important Difference (MCID)  Stroke (acute, subacute, and chronic) = MDC: 4.2 points Vestibular (acute) = MDC: 6 points Community Dwelling Older Adults =  MCID: 4 points Parkinson's Disease  =  MDC: 4.3 points  (Academy of Neurologic Physical Therapy (nd). Functional Gait Assessment. Retrieved from https://www.neuropt.org/docs/default-source/cpgs/core-outcome-measures/function-gait-assessment-pocket-guide-proof9-(2).pdf?sfvrsn=b23f35043_0.)   PATIENT SURVEYS:  Stroke Impact Scale : TBA  TREATMENT DATE: 12/30/23  Self Care: BP and HR taken in sitting at rest beginning of session Vitals:   12/30/23 1456  BP: (!) 141/95  Pulse: (!) 109    Therapeutic Activities: To work on functional LE strengthening, dynamic balance, ankle strategy, and SLS: In // bars with no UE support: Alt L/R gumdrop taps from stance on airex, easy, x 10 reps B Sidestepping along foam beam with alt L/R gumdrop taps 3 x 10 ft L/R, more challenging Added in 6# ankle weights 3 x 10 ft L/R Pt needs max v/c to target L hip abd and keep toes pointed forwards vs turning LE out when sidestepping Gait x 460 ft with no AD and CGA while wearing 6# ankle weights B X 2 laps to the R, x 2 laps to the L Initially with difficulty with LLE clearance but improves as task progresses 6/10 RPE At bottom of stairs with LUE support to encourage WB through LUE: Alt L/R 12 step taps with 6# ankle weights B 3 x 10 reps B Cues for decreased circumduction and lateral trunk lean as compensatory movements Gait x 460 ft with no AD and mod I with ankle weights removed Increased gait speed and improved LE clearance noted   Therapeutic Exercise Pt  issued written HEP (see below for details). HEP performed in clinic: - Standing March with Counter Support: x10 reps B LE's - Standing Hip Abduction with Counter Support: x10 reps B LE's (decreased quality noted with L LE) - Standing Single Leg Stance with Counter Support x1 reps B LE's with 30 second hold  PATIENT EDUCATION: 12/26/23 Education details: FGA and TUG results; reviewed HEP Person educated: Patient Education method: Explanation, Demonstration, and Verbal cues Education comprehension: verbalized understanding and returned demonstration  HOME EXERCISE PROGRAM: 12/23/23 Access Code: [email or web address] URL: [email or web address] Date: 12/23/2023 Prepared by: Damien Caulk  Exercises - Standing March with Counter Support  - 1 x daily - 5 x weekly - 2 sets - 10 reps - Standing Hip Abduction with Counter Support  - 1 x daily - 5 x weekly - 2 sets - 10 reps - Standing Single Leg Stance with Counter Support  - 1 x daily - 5 x weekly - 2 sets - 1 reps - 30 second hold  GOALS: Goals reviewed with patient? Yes  SHORT TERM GOALS: Target date: 01/20/2024  Patient will demo at least 50% compliance with HEP to self manage symptoms.  Baseline: Goal status: INITIAL  2.  Assess FGA Baseline: 18/30 (12/26/23) Goal status: MET  3.  Assess TUG (Cognitive) Baseline: 26.25 seconds Goal status: MET   LONG TERM GOALS: Target date: 02/17/2024   Pt will decrease 5 Time Sit to Stand by at least 3 seconds in order to demonstrate clinically significant improvement in LE strength.  Baseline: 20.81 seconds (Eval) Goal status: INITIAL  2.  Pt will decrease TUG (Cognitive) by at least (or equal to) 3 seconds in order to demonstrate decreased fall risk. Baseline: 26.25 seconds  Goal status: INITIAL  3.  Patient will demo at least 5 points of improvement on FGA to improve functional balance and reduce fall risk. Baseline: 18/30 (12/26/23) Goal status: INITIAL  4.  Pt will be  independent with final HEP in order to improve strength and balance, to decrease fall risk, and improve function at home for ADL's and at work. Baseline:  Goal status: INITIAL   ASSESSMENT:  CLINICAL IMPRESSION: Emphasis of skilled PT session on working on functional LE strengthening, aerobic  conditioning, and dynamic balance. Pt challenged by tasks on compliant surfaces, in SLS, and with added weight to his LE. He also exhibits ongoing LLE weakness and can benefit from further strengthening of this limb. He continues to benefit from skilled PT services to work towards improving his balance, strength, and endurance in order to improve his functional mobility. Continue POC.   OBJECTIVE IMPAIRMENTS: Abnormal gait, cardiopulmonary status limiting activity, decreased activity tolerance, decreased balance, decreased cognition, decreased coordination, decreased endurance, decreased knowledge of condition, decreased knowledge of use of DME, decreased mobility, difficulty walking, decreased strength, decreased safety awareness, increased fascial restrictions, impaired flexibility, impaired sensation, impaired tone, impaired UE functional use, impaired vision/preception, improper body mechanics, and pain.   ACTIVITY LIMITATIONS: carrying, lifting, bending, standing, squatting, sleeping, stairs, transfers, locomotion level, and caring for others  PARTICIPATION LIMITATIONS: meal prep, cleaning, laundry, medication management, driving, shopping, community activity, occupation, and yard work  PERSONAL FACTORS: Age, Time since onset of injury/illness/exacerbation, and 1-2 comorbidities: CHF, hx of CVA, ESRD are also affecting patient's functional outcome.   REHAB POTENTIAL: Good  CLINICAL DECISION MAKING: Evolving/moderate complexity  EVALUATION COMPLEXITY: Moderate  PLAN:  PT FREQUENCY: 2x/week  PT DURATION: 8 weeks  PLANNED INTERVENTIONS: [id number]- PT Re-evaluation, 97750- Physical Performance  Testing, 97110-Therapeutic exercises, 97530- Therapeutic activity, 97112- Neuromuscular re-education, 97535- Self Care, [id number]- Manual therapy, [phone]- Gait training, [phone]- Orthotic/Prosthetic subsequent, [phone]- Aquatic Therapy, [phone]- Electrical stimulation (unattended), Patient/Family education, Balance training, Stair training, Taping, Joint mobilization, Spinal mobilization, Cognitive remediation, DME instructions, Wheelchair mobility training, Cryotherapy, and Moist heat  PLAN FOR NEXT SESSION: SLP referral?; higher level balance with cognitive tasks/dual tasks; [employer]; challenge awareness of L side, L hip abd strengthening, quadruped, half kneel   Waddell Southgate, PT Waddell Southgate, PT, DPT, CSRS  12/30/2023, 3:35 PM

## 2023-12-30 NOTE — Therapy (Signed)
 OUTPATIENT OCCUPATIONAL THERAPY NEURO EVALUATION  Patient Name: Nathan Campbell MRN: [id number] DOB:[birth year], 48 y.o., male Today's Date: 12/30/2023  PCP: None REFERRING PROVIDER: Sebastian Jerilyn HERO, FNP  END OF SESSION:  OT End of Session - 12/30/23 1640     Visit Number 2    Number of Visits 16    Date for Recertification  02/22/24    Authorization Type [employer] [facility]    Authorization Time Period 12/30/23-02/24/24    Authorization - Visit Number 2    Authorization - Number of Visits 17    Progress Note Due on Visit 10    OT Start Time 1532    OT Stop Time 1620    OT Time Calculation (min) 48 min    Equipment Utilized During Treatment dowel rod, mat table    Activity Tolerance Patient tolerated treatment well    Behavior During Therapy WFL for tasks assessed/performed           Past Medical History:  Diagnosis Date   Acute combined systolic and diastolic CHF, NYHA class 4 (HCC) 06/10/2016   Anemia    Cardiomyopathy (HCC) 06/14/2016   CHF (congestive heart failure) (HCC)    COVID 10/2020   Dyspnea    ESRD on hemodialysis (HCC)    TTS at [facility]   Hypertension    Hypertensive heart and kidney disease with heart failure (HCC) 06/14/2016   Hypertensive heart disease with congestive heart failure (HCC)    Obesity    Past Surgical History:  Procedure Laterality Date   A/V FISTULAGRAM Left 05/03/2022   Procedure: A/V Fistulagram;  Surgeon: Eliza Lonni RAMAN, MD;  Location: [facility] INVASIVE CV LAB;  Service: Cardiovascular;  Laterality: Left;   AV FISTULA PLACEMENT Left 11/11/2020   Procedure: LEFT ARM First Stage Basilic Vein Fistula Creation;  Surgeon: Sheree Penne Lonni, MD;  Location: [facility] OR;  Service: Vascular;  Laterality: Left;   BASCILIC VEIN TRANSPOSITION Left 01/23/2021   Procedure: Second Stage Basilic Vein Transposition of Left Arm Arteriovenous  Fistula;  Surgeon: Sheree Penne Lonni, MD;  Location: [facility] OR;  Service: Vascular;  Laterality: Left;   Block  to LMA    COLONOSCOPY     FISTULA SUPERFICIALIZATION Left 05/07/2022   Procedure: PLICATION OF ANEURYSM, LEFT ARM FISTULA;  Surgeon: Eliza Lonni RAMAN, MD;  Location: [facility] OR;  Service: Vascular;  Laterality: Left;   IR CT HEAD LTD  10/07/2023   IR CT HEAD LTD  10/07/2023   IR PATIENT EVAL TECH 0-60 MINS  10/07/2023   IR PERCUTANEOUS ART THROMBECTOMY/INFUSION INTRACRANIAL INC DIAG ANGIO  10/07/2023   NO PAST SURGERIES     PERIPHERAL VASCULAR BALLOON ANGIOPLASTY Left 05/03/2022   Procedure: PERIPHERAL VASCULAR BALLOON ANGIOPLASTY;  Surgeon: Eliza Lonni RAMAN, MD;  Location: [facility] INVASIVE CV LAB;  Service: Cardiovascular;  Laterality: Left;  AVF   RADIOLOGY WITH ANESTHESIA N/A 10/07/2023   Procedure: RADIOLOGY WITH ANESTHESIA;  Surgeon: Radiologist, Medication, MD;  Location: [facility] OR;  Service: Radiology;  Laterality: N/A;   Patient Active Problem List   Diagnosis Date Noted   Stroke (HCC) 11/04/2023   Cognitive and neurobehavioral dysfunction 10/24/2023   Acute hypoxic respiratory failure (HCC) 10/17/2023   Essential hypertension 10/17/2023   Dysphagia 10/17/2023   Leukocytosis 10/17/2023   Obesity 10/17/2023   Acute ischemic stroke (HCC) 10/07/2023   Middle cerebral artery embolism, right 10/07/2023   ESRD (end stage renal disease) (HCC) 03/26/2022   Hyperlipidemia, unspecified 03/20/2021   Acute gout due to renal impairment involving left  wrist 04/24/2019   Malignant hypertension 10/14/2016   Combined congestive systolic and diastolic heart failure (HCC) 06/14/2016   NICM (nonischemic cardiomyopathy) (HCC) 06/14/2016   Renal failure    Hypertensive urgency 06/10/2016   CKD (chronic kidney disease), stage IV (HCC) 06/10/2016   Normocytic anemia 06/10/2016     ONSET DATE: 12/19/2023 referral date Admit date: 10/18/2023 Discharge date: 11/04/2023  REFERRING DIAG: G81.90 (ICD-10-CM) - Hemiplegia, unspecified affecting unspecified side  THERAPY DIAG:  Other lack of  coordination  Muscle weakness (generalized)  Frontal lobe and executive function deficit  Other symptoms and signs involving the musculoskeletal system  Rationale for Evaluation and Treatment: Rehabilitation  SUBJECTIVE:   SUBJECTIVE STATEMENT: Pt reported he was feeling fine today, reported good PT appt. Pt accompanied by: self  PERTINENT HISTORY: Pt is a 48 yo male presenting to this clinic with hemiplegia s/p R MCA embolism. Pt was hospitalized for this from 10/07/23-10/18/23, then discharged to neurology and received IP PT/OT/SLP 10/18/23-11/04/23. Initial CT head revealed right MCA territory ischemic changes with calcific density in proximal right M1 concerning for calcific embolus.   CHF, CM, ESRD on HD TTS, HTN, obesity, HLD  PRECAUTIONS: Fall, L sided weakness, blood thinners  WEIGHT BEARING RESTRICTIONS: No  PAIN:  Are you having pain? No  FALLS: Has patient fallen in last 6 months? No  LIVING ENVIRONMENT: Lives with: lives with their family sister and her 2 sons. Pt reports will be getting own place soon. Lives in: House/apartment townhouse, 2 level Stairs: Yes: Internal: 3 steps; on right going up Has following equipment at home: None  PLOF: Independent and Vocation/Vocational requirements: Worked in distribution in [employer]   PATIENT GOALS: Get back movement in my left arm, return to work, and be able to play video games like I was before.  OBJECTIVE:  Note: Objective measures were completed at Evaluation unless otherwise noted.  HAND DOMINANCE: Right  ADLs: Overall ADLs: Independent Transfers/ambulation related to ADLs: Independent Eating: Independent Grooming: Independent UB Dressing: Independent LB Dressing: Independent Toileting: Independent Bathing: Independent Tub Shower transfers: Independent, shower/tub combo Equipment: none  IADLs: Shopping: Independent Light housekeeping: Mom is doing Pharmacologist.  Meal Prep: Needs some help with opening  cans and jars d/t weakness in L hand Community mobility: Mother driving pt to appts  Medication management: Mother helping with medication mgmt Financial management: Independent Handwriting: did not assess d/t pt report of no changes and pt dominant hand unaffected.  MOBILITY STATUS: Independent  POSTURE COMMENTS:  No Significant postural limitations Sitting balance: WFL  ACTIVITY TOLERANCE: Activity tolerance: No changes  FUNCTIONAL OUTCOME MEASURES: Quick Dash: 47.7/100: 47.7% impairment  UPPER EXTREMITY ROM:  R WNL  Active ROM Right eval Left eval  Shoulder flexion  92  Shoulder abduction  70  Shoulder adduction    Shoulder extension    Shoulder internal rotation    Shoulder external rotation    Elbow flexion    Elbow extension    Wrist flexion  10  Wrist extension  40  Wrist ulnar deviation    Wrist radial deviation    Wrist pronation    Wrist supination  Impaired (re-assess)  (Blank rows = not tested)  UPPER EXTREMITY MMT:   WFL RUE, 3-/5 grossly for LUE  MMT Right eval Left eval  Shoulder flexion    Shoulder abduction    Shoulder adduction    Shoulder extension    Shoulder internal rotation    Shoulder external rotation    Middle trapezius  Lower trapezius    Elbow flexion    Elbow extension    Wrist flexion    Wrist extension    Wrist ulnar deviation    Wrist radial deviation    Wrist pronation    Wrist supination    (Blank rows = not tested)  HAND FUNCTION: Grip strength: Right: 61.2 lbs; Left: 15.8 lbs  COORDINATION: 9 Hole Peg test: Right: 41.60 sec; Left: unable to pick up a peg in 2 minutes sec Box and Blocks:  Right 36 blocks, Left 11blocks  SENSATION: WFL  EDEMA: none  MUSCLE TONE: LUE: Rigidity  COGNITION: Overall cognitive status: Within functional limits for tasks assessed  VISION: Subjective report: no concerns Baseline vision: No visual deficits Visual history: no hx  VISION ASSESSMENT: WFL To be further  assessed in functional context  Patient has difficulty with following activities due to following visual impairments: none  PERCEPTION: WFL  PRAXIS: Impaired: Ideomotor  OBSERVATIONS: Weakness in L hand and limited ROM in LUE affecting ability to complete ADL/IADL.                                                                                                                              TREATMENT DATE: 12/30/23  Followed up with pt on accessing [email or web address] resources including info on the support group, pt reported I've just been kind of busy but did express interest and said I will take a look when things settle down a bit. Educated pt in sleeping positions for L sided hemiplegia/hemiparesis to promote improved functional use of affected LUE.  Educated pt in L AAROM/PROM for shoulder, wrist, forearm, and hand. See pt instructions for further details.   Assessed vision with Bell Cancellation test, successfully located 33/35 bells.       PATIENT EDUCATION: Education details: Educated on counseling services as well as local stroke support group resource. Person educated: Patient Education method: Explanation, Demonstration, and Handouts Education comprehension: verbalized understanding, verbal cues required, tactile cues required, and needs further education  HOME EXERCISE PROGRAM: 12/30/23: Shoulder, Forearm/wrist, and hand HEP (ACCESS CODE: [id number])    GOALS: Goals reviewed with patient? Yes  SHORT TERM GOALS: Target date: 01/23/24  Pt will be independent with LUE ROM, coordination, and grip strengthening HEP Baseline: New to OP OT Goal status: IN PROGRESS  2.  Pt will increase L shoulder abduction to at least 90 degrees for improved functional use  Baseline: 70 degrees  Goal status: INITIAL  3.  Pt will be independent with sleeping strategies for hemiparesis and hemiplegia of LUE Baseline: New to OP OT Goal status: INITIAL  4.  Pt will be independent with  resting hand splint wear and care for night use and to improve functional use in L hand Baseline: New to OP OT Goal status: INITIAL    LONG TERM GOALS: Target date: 02/22/24  Pt will decrease QuickDASH score to no more than 25/100 to demonstrate improved function in daily tasks Baseline:  47.5/100 Goal status: INITIAL  2.  Pt will increase L shoulder flexion ROM to at least 120 degrees for improved functional use Baseline: 92 degrees Goal status: INITIAL  3.  Pt will increase L shoulder abduction ROM to at least 100 degrees for improved functional use Baseline: 70 degrees Goal status: INITIAL  4.  Pt will increase L wrist flexion and extension by at least 40 degrees and 20 degrees, respectively. Baseline: 10 degrees wrist flexion, 40 degrees wrist extension Goal status: INITIAL   ASSESSMENT:  CLINICAL IMPRESSION: Patient is a 48 y.o. male who was seen today for occupational therapy tx for s/p CVA and hemiplegia. Hx includes CHF, CM, ESRD on HD TTS, HTN, obesity, HLD. Pt participated well with HEP for shoulder, wrist, and hand, however did reuqire tactile cues and mod verbal cues for proper form. Pt expressed great interest in e-stim, may attempt next tx session as appropriate. Pt would benefit from continued OT services to now address coordination and grip strength in affected L hand for max potential to be reached.  PERFORMANCE DEFICITS: in functional skills including ADLs, IADLs, coordination, dexterity, tone, ROM, strength, pain, Fine motor control, body mechanics, cardiopulmonary status limiting function, and UE functional use, cognitive skills including attention, safety awareness, and sequencing, and psychosocial skills including coping strategies, interpersonal interactions, and routines and behaviors.   IMPAIRMENTS: are limiting patient from IADLs, rest and sleep, work, and leisure.   CO-MORBIDITIES: has co-morbidities such as ESRD that affects occupational performance.  Patient will benefit from skilled OT to address above impairments and improve overall function.  MODIFICATION OR ASSISTANCE TO COMPLETE EVALUATION: Min-Moderate modification of tasks or assist with assess necessary to complete an evaluation.  OT OCCUPATIONAL PROFILE AND HISTORY: Detailed assessment: Review of records and additional review of physical, cognitive, psychosocial history related to current functional performance.  CLINICAL DECISION MAKING: Moderate - several treatment options, min-mod task modification necessary  REHAB POTENTIAL: Good  EVALUATION COMPLEXITY: Moderate    PLAN:  OT FREQUENCY: 2x/week  OT DURATION: 8 weeks  PLANNED INTERVENTIONS: 97168 OT Re-evaluation, 97535 self care/ADL training, [id number] therapeutic exercise, 97530 therapeutic activity, 97112 neuromuscular re-education, 97140 manual therapy, 97032 electrical stimulation (manual), 97760 Orthotic Initial, [id number] Orthotic/Prosthetic subsequent, passive range of motion, functional mobility training, visual/perceptual remediation/compensation, energy conservation, patient/family education, and DME and/or AE instructions  RECOMMENDED OTHER SERVICES: none noted  CONSULTED AND AGREED WITH PLAN OF CARE: Patient  PLAN FOR NEXT SESSION: Assess peripheral vision Putty HEP Coordination HEP E-stim as appropriate   Rocky Dutch, OT 12/30/2023, 4:44 PM

## 2023-12-30 NOTE — Patient Instructions (Signed)
 Nathan Campbell

## 2024-01-02 ENCOUNTER — Encounter: Payer: Self-pay | Admitting: Physical Therapy

## 2024-01-02 ENCOUNTER — Ambulatory Visit: Admitting: Physical Therapy

## 2024-01-02 ENCOUNTER — Ambulatory Visit

## 2024-01-02 VITALS — BP 148/93 | HR 72

## 2024-01-02 DIAGNOSIS — R29898 Other symptoms and signs involving the musculoskeletal system: Secondary | ICD-10-CM

## 2024-01-02 DIAGNOSIS — R2689 Other abnormalities of gait and mobility: Secondary | ICD-10-CM

## 2024-01-02 DIAGNOSIS — R278 Other lack of coordination: Secondary | ICD-10-CM

## 2024-01-02 DIAGNOSIS — I63411 Cerebral infarction due to embolism of right middle cerebral artery: Secondary | ICD-10-CM

## 2024-01-02 DIAGNOSIS — M6281 Muscle weakness (generalized): Secondary | ICD-10-CM

## 2024-01-02 NOTE — Therapy (Signed)
 OUTPATIENT OCCUPATIONAL THERAPY NEURO EVALUATION  Patient Name: Nathan Campbell MRN: [id number] DOB:04-18-1975, 48 y.o., male Today's Date: 01/02/2024  PCP: None REFERRING PROVIDER: Sebastian Jerilyn HERO, FNP  END OF SESSION:  OT End of Session - 01/02/24 1406     Visit Number 3    Number of Visits 16    Date for Recertification  02/22/24    Authorization Type [employer] [facility]    Authorization Time Period 12/30/23-02/24/24    Authorization - Visit Number 3    Authorization - Number of Visits 17    Progress Note Due on Visit 10    OT Start Time 1402    OT Stop Time 1445    OT Time Calculation (min) 43 min           Past Medical History:  Diagnosis Date   Acute combined systolic and diastolic CHF, NYHA class 4 (HCC) 06/10/2016   Anemia    Cardiomyopathy (HCC) 06/14/2016   CHF (congestive heart failure) (HCC)    COVID 10/2020   Dyspnea    ESRD on hemodialysis (HCC)    TTS at [facility]   Hypertension    Hypertensive heart and kidney disease with heart failure (HCC) 06/14/2016   Hypertensive heart disease with congestive heart failure (HCC)    Obesity    Past Surgical History:  Procedure Laterality Date   A/V FISTULAGRAM Left 05/03/2022   Procedure: A/V Fistulagram;  Surgeon: Eliza Lonni RAMAN, MD;  Location: [facility] INVASIVE CV LAB;  Service: Cardiovascular;  Laterality: Left;   AV FISTULA PLACEMENT Left 11/11/2020   Procedure: LEFT ARM First Stage Basilic Vein Fistula Creation;  Surgeon: Sheree Penne Lonni, MD;  Location: [facility] OR;  Service: Vascular;  Laterality: Left;   BASCILIC VEIN TRANSPOSITION Left 01/23/2021   Procedure: Second Stage Basilic Vein Transposition of Left Arm Arteriovenous  Fistula;  Surgeon: Sheree Penne Lonni, MD;  Location: [facility] OR;  Service: Vascular;  Laterality: Left;  Block  to LMA    COLONOSCOPY     FISTULA SUPERFICIALIZATION Left 05/07/2022   Procedure: PLICATION OF ANEURYSM, LEFT ARM FISTULA;  Surgeon: Eliza Lonni RAMAN, MD;   Location: [facility] OR;  Service: Vascular;  Laterality: Left;   IR CT HEAD LTD  10/07/2023   IR CT HEAD LTD  10/07/2023   IR PATIENT EVAL TECH 0-60 MINS  10/07/2023   IR PERCUTANEOUS ART THROMBECTOMY/INFUSION INTRACRANIAL INC DIAG ANGIO  10/07/2023   NO PAST SURGERIES     PERIPHERAL VASCULAR BALLOON ANGIOPLASTY Left 05/03/2022   Procedure: PERIPHERAL VASCULAR BALLOON ANGIOPLASTY;  Surgeon: Eliza Lonni RAMAN, MD;  Location: [facility] INVASIVE CV LAB;  Service: Cardiovascular;  Laterality: Left;  AVF   RADIOLOGY WITH ANESTHESIA N/A 10/07/2023   Procedure: RADIOLOGY WITH ANESTHESIA;  Surgeon: Radiologist, Medication, MD;  Location: [facility] OR;  Service: Radiology;  Laterality: N/A;   Patient Active Problem List   Diagnosis Date Noted   Stroke (HCC) 11/04/2023   Cognitive and neurobehavioral dysfunction 10/24/2023   Acute hypoxic respiratory failure (HCC) 10/17/2023   Essential hypertension 10/17/2023   Dysphagia 10/17/2023   Leukocytosis 10/17/2023   Obesity 10/17/2023   Acute ischemic stroke (HCC) 10/07/2023   Middle cerebral artery embolism, right 10/07/2023   ESRD (end stage renal disease) (HCC) 03/26/2022   Hyperlipidemia, unspecified 03/20/2021   Acute gout due to renal impairment involving left wrist 04/24/2019   Malignant hypertension 10/14/2016   Combined congestive systolic and diastolic heart failure (HCC) 06/14/2016   NICM (nonischemic cardiomyopathy) (HCC) 06/14/2016   Renal failure  Hypertensive urgency 06/10/2016   CKD (chronic kidney disease), stage IV (HCC) 06/10/2016   Normocytic anemia 06/10/2016     ONSET DATE: 12/19/2023 referral date Admit date: 10/18/2023 Discharge date: 11/04/2023  REFERRING DIAG: G81.90 (ICD-10-CM) - Hemiplegia, unspecified affecting unspecified side  THERAPY DIAG:  Other lack of coordination  Muscle weakness (generalized)  Other symptoms and signs involving the musculoskeletal system  Rationale for Evaluation and Treatment:  Rehabilitation  SUBJECTIVE:   SUBJECTIVE STATEMENT: Pt reports completing stretches and exercises at home. Pt accompanied by: self  PERTINENT HISTORY: Pt is a 48 yo male presenting to this clinic with hemiplegia s/p R MCA embolism. Pt was hospitalized for this from 10/07/23-10/18/23, then discharged to neurology and received IP PT/OT/SLP 10/18/23-11/04/23. Initial CT head revealed right MCA territory ischemic changes with calcific density in proximal right M1 concerning for calcific embolus.   CHF, CM, ESRD on HD TTS, HTN, obesity, HLD  PRECAUTIONS: Fall, L sided weakness, blood thinners  WEIGHT BEARING RESTRICTIONS: No  PAIN:  Are you having pain? No  FALLS: Has patient fallen in last 6 months? No  LIVING ENVIRONMENT: Lives with: lives with their family sister and her 2 sons. Pt reports will be getting own place soon. Lives in: House/apartment townhouse, 2 level Stairs: Yes: Internal: 3 steps; on right going up Has following equipment at home: None  PLOF: Independent and Vocation/Vocational requirements: Worked in distribution in [employer]   PATIENT GOALS: Get back movement in my left arm, return to work, and be able to play video games like I was before.  OBJECTIVE:  Note: Objective measures were completed at Evaluation unless otherwise noted.  HAND DOMINANCE: Right  ADLs: Overall ADLs: Independent Transfers/ambulation related to ADLs: Independent Eating: Independent Grooming: Independent UB Dressing: Independent LB Dressing: Independent Toileting: Independent Bathing: Independent Tub Shower transfers: Independent, shower/tub combo Equipment: none  IADLs: Shopping: Independent Light housekeeping: Mom is doing Pharmacologist.  Meal Prep: Needs some help with opening cans and jars d/t weakness in L hand Community mobility: Mother driving pt to appts  Medication management: Mother helping with medication mgmt Financial management: Independent Handwriting: did not assess  d/t pt report of no changes and pt dominant hand unaffected.   MOBILITY STATUS: Independent  POSTURE COMMENTS:  No Significant postural limitations Sitting balance: WFL  ACTIVITY TOLERANCE: Activity tolerance: No changes  FUNCTIONAL OUTCOME MEASURES: Quick Dash: 47.7/100: 47.7% impairment  UPPER EXTREMITY ROM:  R WNL  Active ROM Right eval Left eval  Shoulder flexion  92  Shoulder abduction  70  Shoulder adduction    Shoulder extension    Shoulder internal rotation    Shoulder external rotation    Elbow flexion    Elbow extension    Wrist flexion  10  Wrist extension  40  Wrist ulnar deviation    Wrist radial deviation    Wrist pronation    Wrist supination  Impaired (re-assess)  (Blank rows = not tested)  UPPER EXTREMITY MMT:   WFL RUE, 3-/5 grossly for LUE  MMT Right eval Left eval  Shoulder flexion    Shoulder abduction    Shoulder adduction    Shoulder extension    Shoulder internal rotation    Shoulder external rotation    Middle trapezius    Lower trapezius    Elbow flexion    Elbow extension    Wrist flexion    Wrist extension    Wrist ulnar deviation    Wrist radial deviation    Wrist pronation  Wrist supination    (Blank rows = not tested)  HAND FUNCTION: Grip strength: Right: 61.2 lbs; Left: 15.8 lbs  COORDINATION: 9 Hole Peg test: Right: 41.60 sec; Left: unable to pick up a peg in 2 minutes sec Box and Blocks:  Right 36 blocks, Left 11blocks  SENSATION: WFL  EDEMA: none  MUSCLE TONE: LUE: Rigidity  COGNITION: Overall cognitive status: Within functional limits for tasks assessed  VISION: Subjective report: no concerns Baseline vision: No visual deficits Visual history: no hx  VISION ASSESSMENT: WFL To be further assessed in functional context  Patient has difficulty with following activities due to following visual impairments: none  PERCEPTION: WFL  PRAXIS: Impaired: Ideomotor  OBSERVATIONS: Weakness in L hand and  limited ROM in LUE affecting ability to complete ADL/IADL.                                                                                                                              TREATMENT DATE: 01/02/24  Began FM coordination tasks to improve dexterity in L hand, some difficulty noted with card tasks d/t impaired ability to oppose digits and pinch. Therefore switched to educating pt in putty HEP to improve pinch and grip strength in L hand for carryover with FM coordination and ability to complete grooming tasks and ADL. Pt participated well with putty, did require grading down of FM coordination by simplifying tasks to involve less forearm supination/pronation and allowing for larger items to be used for easier manipulation in L hand.       PATIENT EDUCATION: Education details: Putty HEP Person educated: Patient Education method: Programmer, multimedia, Facilities manager, and Handouts Education comprehension: verbalized understanding, returned demonstration, verbal cues required, and needs further education  HOME EXERCISE PROGRAM: 12/30/23: Shoulder, Forearm/wrist, and hand HEP (ACCESS CODE: [id number])   01/02/24: Theraputty HEP with red putty (ACCESS CODE [id number])   GOALS: Goals reviewed with patient? Yes  SHORT TERM GOALS: Target date: 01/23/24  Pt will be independent with LUE ROM, coordination, and grip strengthening HEP Baseline: New to OP OT Goal status: IN PROGRESS  2.  Pt will increase L shoulder abduction to at least 90 degrees for improved functional use  Baseline: 70 degrees  Goal status: INITIAL  3.  Pt will be independent with sleeping strategies for hemiparesis and hemiplegia of LUE Baseline: New to OP OT Goal status: IN PROGRESS  4.  Pt will be independent with resting hand splint wear and care for night use and to improve functional use in L hand Baseline: New to OP OT Goal status: INITIAL    LONG TERM GOALS: Target date: 02/22/24  Pt will decrease QuickDASH  score to no more than 25/100 to demonstrate improved function in daily tasks Baseline: 47.5/100 Goal status: INITIAL  2.  Pt will increase L shoulder flexion ROM to at least 120 degrees for improved functional use Baseline: 92 degrees Goal status: INITIAL  3.  Pt will increase L shoulder abduction ROM to  at least 100 degrees for improved functional use Baseline: 70 degrees Goal status: INITIAL  4.  Pt will increase L wrist flexion and extension by at least 40 degrees and 20 degrees, respectively. Baseline: 10 degrees wrist flexion, 40 degrees wrist extension Goal status: INITIAL   ASSESSMENT:  CLINICAL IMPRESSION: Patient is a 48 y.o. male who was seen today for occupational therapy tx for s/p CVA and hemiplegia. Hx includes CHF, CM, ESRD on HD TTS, HTN, obesity, HLD. Pt participated well with putty, impaired FM coordination still noted. Pt at this time would benefit from continued skilled OT services to promote improved use of LUE for return to PLOF as able.   PERFORMANCE DEFICITS: in functional skills including ADLs, IADLs, coordination, dexterity, tone, ROM, strength, pain, Fine motor control, body mechanics, cardiopulmonary status limiting function, and UE functional use, cognitive skills including attention, safety awareness, and sequencing, and psychosocial skills including coping strategies, interpersonal interactions, and routines and behaviors.   IMPAIRMENTS: are limiting patient from IADLs, rest and sleep, work, and leisure.   CO-MORBIDITIES: has co-morbidities such as ESRD that affects occupational performance. Patient will benefit from skilled OT to address above impairments and improve overall function.  MODIFICATION OR ASSISTANCE TO COMPLETE EVALUATION: Min-Moderate modification of tasks or assist with assess necessary to complete an evaluation.  OT OCCUPATIONAL PROFILE AND HISTORY: Detailed assessment: Review of records and additional review of physical, cognitive,  psychosocial history related to current functional performance.  CLINICAL DECISION MAKING: Moderate - several treatment options, min-mod task modification necessary  REHAB POTENTIAL: Good  EVALUATION COMPLEXITY: Moderate    PLAN:  OT FREQUENCY: 2x/week  OT DURATION: 8 weeks  PLANNED INTERVENTIONS: [id number] OT Re-evaluation, 97535 self care/ADL training, [id number] therapeutic exercise, 97530 therapeutic activity, 97112 neuromuscular re-education, 97140 manual therapy, 97032 electrical stimulation (manual), 97760 Orthotic Initial, [id number] Orthotic/Prosthetic subsequent, passive range of motion, functional mobility training, visual/perceptual remediation/compensation, energy conservation, patient/family education, and DME and/or AE instructions  RECOMMENDED OTHER SERVICES: none noted  CONSULTED AND AGREED WITH PLAN OF CARE: Patient  PLAN FOR NEXT SESSION:  E-stim Continue coordination F/u putty WB activities AROM/PROM   [employer], OT 01/02/2024, 3:05 PM

## 2024-01-02 NOTE — Therapy (Signed)
 OUTPATIENT PHYSICAL THERAPY NEURO TREATMENT   Patient Name: Laksh Hinners MRN: [id number] DOB:Sep 15, 1975, 48 y.o., male Today's Date: 01/02/2024   PCP: Pt reports PCP is Blad but did not give any further identifying information. REFERRING PROVIDER: Sebastian Jerilyn HERO, FNP  END OF SESSION:  PT End of Session - 01/02/24 1453     Visit Number 4    Number of Visits 17   16 plus Eval   Date for Recertification  02/24/24   For scheduling delays   Authorization Type [facility] Medicare    Authorization Time Period 12/26/23 - 02/20/24    Authorization - Visit Number 4    Authorization - Number of Visits 17    PT Start Time 1452    PT Stop Time 1535    PT Time Calculation (min) 43 min    Equipment Utilized During Treatment Gait belt    Activity Tolerance Patient tolerated treatment well    Behavior During Therapy WFL for tasks assessed/performed          Past Medical History:  Diagnosis Date   Acute combined systolic and diastolic CHF, NYHA class 4 (HCC) 06/10/2016   Anemia    Cardiomyopathy (HCC) 06/14/2016   CHF (congestive heart failure) (HCC)    COVID 10/2020   Dyspnea    ESRD on hemodialysis (HCC)    TTS at [facility]   Hypertension    Hypertensive heart and kidney disease with heart failure (HCC) 06/14/2016   Hypertensive heart disease with congestive heart failure (HCC)    Obesity    Past Surgical History:  Procedure Laterality Date   A/V FISTULAGRAM Left 05/03/2022   Procedure: A/V Fistulagram;  Surgeon: Eliza Lonni RAMAN, MD;  Location: [facility] INVASIVE CV LAB;  Service: Cardiovascular;  Laterality: Left;   AV FISTULA PLACEMENT Left 11/11/2020   Procedure: LEFT ARM First Stage Basilic Vein Fistula Creation;  Surgeon: Sheree Penne Lonni, MD;  Location: [facility] OR;  Service: Vascular;  Laterality: Left;   BASCILIC VEIN TRANSPOSITION Left 01/23/2021   Procedure: Second Stage Basilic Vein Transposition of Left Arm Arteriovenous  Fistula;  Surgeon: Sheree Penne Lonni, MD;  Location: [facility] OR;  Service: Vascular;  Laterality: Left;  Block  to LMA    COLONOSCOPY     FISTULA SUPERFICIALIZATION Left 05/07/2022   Procedure: PLICATION OF ANEURYSM, LEFT ARM FISTULA;  Surgeon: Eliza Lonni RAMAN, MD;  Location: [facility] OR;  Service: Vascular;  Laterality: Left;   IR CT HEAD LTD  10/07/2023   IR CT HEAD LTD  10/07/2023   IR PATIENT EVAL TECH 0-60 MINS  10/07/2023   IR PERCUTANEOUS ART THROMBECTOMY/INFUSION INTRACRANIAL INC DIAG ANGIO  10/07/2023   NO PAST SURGERIES     PERIPHERAL VASCULAR BALLOON ANGIOPLASTY Left 05/03/2022   Procedure: PERIPHERAL VASCULAR BALLOON ANGIOPLASTY;  Surgeon: Eliza Lonni RAMAN, MD;  Location: [facility] INVASIVE CV LAB;  Service: Cardiovascular;  Laterality: Left;  AVF   RADIOLOGY WITH ANESTHESIA N/A 10/07/2023   Procedure: RADIOLOGY WITH ANESTHESIA;  Surgeon: Radiologist, Medication, MD;  Location: [facility] OR;  Service: Radiology;  Laterality: N/A;   Patient Active Problem List   Diagnosis Date Noted   Stroke (HCC) 11/04/2023   Cognitive and neurobehavioral dysfunction 10/24/2023   Acute hypoxic respiratory failure (HCC) 10/17/2023   Essential hypertension 10/17/2023   Dysphagia 10/17/2023   Leukocytosis 10/17/2023   Obesity 10/17/2023   Acute ischemic stroke (HCC) 10/07/2023   Middle cerebral artery embolism, right 10/07/2023   ESRD (end stage renal disease) (HCC) 03/26/2022   Hyperlipidemia,  unspecified 03/20/2021   Acute gout due to renal impairment involving left wrist 04/24/2019   Malignant hypertension 10/14/2016   Combined congestive systolic and diastolic heart failure (HCC) 06/14/2016   NICM (nonischemic cardiomyopathy) (HCC) 06/14/2016   Renal failure    Hypertensive urgency 06/10/2016   CKD (chronic kidney disease), stage IV (HCC) 06/10/2016   Normocytic anemia 06/10/2016    ONSET DATE: 12/14/2023- date of referral  REFERRING DIAG: G81.90 (ICD-10-CM) - Hemiparesis (HCC)  THERAPY DIAG:  Muscle weakness  (generalized)  Other abnormalities of gait and mobility  Cerebrovascular accident (CVA) due to embolism of right middle cerebral artery (HCC)  Other symptoms and signs involving the musculoskeletal system  Rationale for Evaluation and Treatment: Rehabilitation  SUBJECTIVE:  Likes to be called LC.                                                                                                                                                                                            SUBJECTIVE STATEMENT: Pt reports he has been doing his ex's and also walking his dog multiple times a day.  No falls reported.  Doing HEP LE marching and SLS (up to 15 seconds) for HEP. Pt accompanied by: self.  Mom drove pt and was in waiting room.  PERTINENT HISTORY: Hospitalization 10/07/23 for acute onset of left-sided weakness, left side neglect, dysarthria and right gaze preference; imaging showing acute right MCA CVA with right M1 occlusion (intubated for airway protection; s/p TNK; s/p revascularization of R MCA with IR 7/25).  IP rehab stay 10/18/23-11/04/23. Urgent care visit 12/02/23 for gout (L foot).  PMH includes: gout, CKD, ESRD on HD T/R/Sat, CHF, anemia, htn, stroke 10/07/23, acute hypoxic respiratory failure, dysphagia, cardiomyopathy, and obesity (BMI 41).  PAIN:  Are you having pain? Yes: NPRS scale: 0/10 currently Pain location: L shoulder Pain description: Aching Aggravating factors: Laying down Relieving factors: Muscle relaxer  PRECAUTIONS: Fall; fistula in LUE (dialysis T/Th/Sat)  RED FLAGS: None   WEIGHT BEARING RESTRICTIONS: No  FALLS: Has patient fallen in last 6 months? No  LIVING ENVIRONMENT: Lives with: Sister and her 2 sons; mom currently staying with pt to assist as needed Lives in: Other Townhouse--stays on 2nd floor Stairs: Yes: Internal: 5 steps; on right going up and External: 0 steps; none Has following equipment at home: Crutches  PLOF: Independent  PATIENT GOALS:  To get more function in arm; get voice back; get strength up.  OBJECTIVE:  Note: Objective measures were completed at Evaluation unless otherwise noted.  COGNITION: Overall cognitive status: A&Ox4; possible decreased awareness of deficits  COORDINATION:  Rapid Alternating toe taps (in sitting):  Decreased L LE  Heel to shin (in sitting); mild decreased L LE  LOWER EXTREMITY ROM:     Active  Right Eval Left Eval  Hip flexion WNL WNL  Hip extension    Hip abduction    Hip adduction    Hip internal rotation    Hip external rotation    Knee flexion WNL WNL  Knee extension WNL WNL  Ankle dorsiflexion WNL WNL  Ankle plantarflexion WNL WNL  Ankle inversion    Ankle eversion     (Blank rows = not tested)  LOWER EXTREMITY MMT:    MMT Right Eval Left Eval  Hip flexion 5/5 4+/5  Hip extension    Hip abduction    Hip adduction    Hip internal rotation    Hip external rotation    Knee flexion 5/5 4+/5  Knee extension 5/5 5/5  Ankle dorsiflexion 5/5 4+/5  Ankle plantarflexion At least 3/5 AROM At least 3/5 AROM  Ankle inversion    Ankle eversion    (Blank rows = not tested) GAIT: Findings: Gait Characteristics: step through pattern, decreased arm swing- Left, decreased ankle dorsiflexion- Left, and wide BOS, Distance walked: clinic distances, Assistive device utilized:None, Level of assistance: SBA, and Comments: pt tending to walk close to items on L side but did not bump into anything  FUNCTIONAL TESTS:  5 times sit to stand: 20.81 seconds 10 meter walk test: 1.25 m/sec (7.97 seconds); no AD use Functional gait assessment: 18/30 (12/26/23) SLS: R LE 1st 2 trials about 1 second each but on 3rd trial 9.0 seconds; L LE 1st 2 trials about 1 second each but on 3rd trial 30 seconds L LE (time stopped by therapist at 30 seconds) TUG (Manual): 9.41 seconds 12/26/23 TUG (Cognitive): 26.25 seconds 12/26/23  FUNCTIONAL GAIT ASSESSMENT  Date: 12/26/23 Score  GAIT LEVEL  SURFACE Instructions: Walk at your normal speed from here to the next mark (6 m) [20 ft]. (1) Moderate impairment-Walks 6 m (20 ft), slow speed, abnormal gait pattern, evidence for imbalance, or deviates 25.4 - 38.1 cm (10 -15 in) outside of the 30.48-cm (12-in) walkway width. Requires more than 7 seconds to ambulate 6 m (20 ft). 8.53 seconds  2.   CHANGE IN GAIT SPEED Instructions: Begin walking at your normal pace (for 1.5 m [5 ft]). When I tell you "go," walk as fast as you can (for 1.5 m [5 ft]). When I tell you "slow," walk as slowly as you can (for 1.5 m [5 ft]. (2) Mild impairment - Is able to change speed but demonstrates mild gait deviations, deviates 15.24 -25.4 cm (6 -10 in) outside of the 30.48-cm (12-in) walkway width, or no gait deviations but unable to achieve a significant change in velocity, or uses an assistive device  3.    GAIT WITH HORIZONTAL HEAD TURNS Instructions: Walk from here to the next mark 6 m (20 ft) away. Begin walking at your normal pace. Keep walking straight; after 3 steps, turn your head to the right and keep walking straight while looking to the right. After 3 more steps, turn your head to the left and keep walking straight while looking left. Continue alternating looking right and left. (2) Mild impairment - Performs head turns smoothly with slight change in gait velocity (eg, minor disruption to smooth gait path), deviates 15.24 -25.4 cm (6 -10 in) outside 30.48-cm (12-in) walkway width, or uses an assistive device.  4.   GAIT WITH VERTICAL HEAD TURNS Instructions: Walk from  here to the next mark (6 m [20 ft]). Begin walking at your normal pace. Keep walking straight; after 3 steps, tip your head up and keep walking straight while looking up. After 3 more steps, tip your head down, keep walking straight while looking down. Continue  alternating looking up and down every 3 steps until you have completed 2 repetitions in each direction. (2) Mild impairment - Performs task  with slight change in gait velocity (eg, minor disruption to smooth gait path), deviates 15.24 -25.4 cm (6 -10 in) outside 30.48-cm (12-in) walkway width or uses assistive device.  5.  GAIT AND PIVOT TURN Instructions: Begin with walking at your normal pace. When I tell you, "turn and stop," turn as quickly as you can to face the opposite direction and stop. (2) Mild impairment - Pivot turns safely in 3 seconds and stops with no loss of balance, or pivot turns safely within 3 seconds and stops with mild imbalance, requires small steps to catch balance  6.   STEP OVER OBSTACLE Instructions: Begin walking at your normal speed. When you come to the shoe box, step over it, not around it, and keep walking. (2) Mild impairment - Is able to step over one shoe box (11.43 cm [4.5 in] total height) without changing gait speed; no evidence of imbalance.  7.   GAIT WITH NARROW BASE OF SUPPORT Instructions: Walk on the floor with arms folded across the chest, feet aligned heel to toe in tandem for a distance of 3.6 m [12 ft]. The number of steps taken in a straight line are counted for a maximum of 10 steps. (2) Mild impairment - Ambulates 7-9 steps  8.   GAIT WITH EYES CLOSED Instructions: Walk at your normal speed from here to the next mark (6 m [20 ft]) with your eyes closed. (2) Mild impairment - Walks 6 m (20 ft), uses assistive device, slower speed, mild gait deviations, deviates 15.24 -25.4 cm (6 -10 in) outside 30.48-cm (12-in) walkway width. Ambulates 6 m (20 ft) in less than 9 seconds but greater than 7 seconds 7.72 seconds  9.   AMBULATING BACKWARDS Instructions: Walk backwards until I tell you to stop (2) Mild impairment - Walks 6 m (20 ft), uses assistive device, slower speed, mild gait deviations, deviates 15.24 -25.4 cm (6 -10 in) outside 30.48-cm (12-in) walkway width12.06 seconds  10. STEPS Instructions: Walk up these stairs as you would at home (ie, using the rail if necessary). At the top turn  around and walk down. (1) Moderate impairment-Two feet to a stair; must use rail.  Total 18/30   Interpretation of scores: Non-Specific Older Adults Cutoff Score: <=22/30 = risk of falls Parkinson's Disease Cutoff score <15/30= fall risk (Hoehn & Yahr 1-4)  Minimally Clinically Important Difference (MCID)  Stroke (acute, subacute, and chronic) = MDC: 4.2 points Vestibular (acute) = MDC: 6 points Community Dwelling Older Adults =  MCID: 4 points Parkinson's Disease  =  MDC: 4.3 points  (Academy of Neurologic Physical Therapy (nd). Functional Gait Assessment. Retrieved from https://www.neuropt.org/docs/default-source/cpgs/core-outcome-measures/function-gait-assessment-pocket-guide-proof9-(2).pdf?sfvrsn=b57f35043_0.)   PATIENT SURVEYS:  Stroke Impact Scale : TBA  TREATMENT DATE: 01/02/2024  Self Care: BP and HR taken in sitting at rest beginning of session    Vitals:   01/02/24 1457  BP: (!) 148/93  Pulse: 72   Therapeutic Exercise: Reviewed HEP: encouraged pt to do standing LE hip abduction ex's previously issued (educated pt on benefits of exercise) Quadruped (on mat table) core stabilization with hip extension toe taps x10 reps B LE's (limited d/t pt fatigue)  Therapeutic Activity: Tall kneeling; holding 3.3# ball in B hands; 2x10 reps B (moving ball from hip to opposite shoulder); focusing on holding ball evenly with B hands and engaging core for stability; CGA for safety; intermittent assist for L hand position on ball Standing at steps; L UE support on railing to promote WB'ing; toe taps onto 12 inch step; x10 reps B LE's; with 6# weight on LE's; vc's to prevent LE circumduction; pt fatigued with activity limiting activity Sidestepping in // bars; no UE support; on foam beam (compliant surface); CGA for safety; x4 trials R/L (x6 feet each direction); vc's to  keep L LE in neutral position (not externally rotated) when sidestepping to L   PATIENT EDUCATION: 01/02/24 Education details: Reviewed HEP (encouraged pt to perform standing hip abduction exercise). Person educated: Patient Education method: Explanation, Demonstration, and Verbal cues Education comprehension: verbalized understanding  HOME EXERCISE PROGRAM: 12/23/23 Access Code: [email or web address] URL: [email or web address] Date: 12/23/2023 Prepared by: Damien Caulk  Exercises - Standing March with Counter Support  - 1 x daily - 5 x weekly - 2 sets - 10 reps - Standing Hip Abduction with Counter Support  - 1 x daily - 5 x weekly - 2 sets - 10 reps - Standing Single Leg Stance with Counter Support  - 1 x daily - 5 x weekly - 2 sets - 1 reps - 30 second hold  GOALS: Goals reviewed with patient? Yes  SHORT TERM GOALS: Target date: 01/20/2024  Patient will demo at least 50% compliance with HEP to self manage symptoms.  Baseline: Goal status: INITIAL  2.  Assess FGA Baseline: 18/30 (12/26/23) Goal status: MET  3.  Assess TUG (Cognitive) Baseline: 26.25 seconds Goal status: MET   LONG TERM GOALS: Target date: 02/17/2024   Pt will decrease 5 Time Sit to Stand by at least 3 seconds in order to demonstrate clinically significant improvement in LE strength.  Baseline: 20.81 seconds (Eval) Goal status: INITIAL  2.  Pt will decrease TUG (Cognitive) by at least (or equal to) 3 seconds in order to demonstrate decreased fall risk. Baseline: 26.25 seconds  Goal status: INITIAL  3.  Patient will demo at least 5 points of improvement on FGA to improve functional balance and reduce fall risk. Baseline: 18/30 (12/26/23) Goal status: INITIAL  4.  Pt will be independent with final HEP in order to improve strength and balance, to decrease fall risk, and improve function at home for ADL's and at work. Baseline:  Goal status: INITIAL   ASSESSMENT:  CLINICAL  IMPRESSION: Patient was seen today for physical therapy treatment to address strengthening, core stabilization, and dynamic balance.  Focused session on core stabilization with LE or UE movements in quadruped and tall kneeling, SLS activity (with added weight to LE), and dynamic balance on compliant surfaces.  Patient continues to be limited by L LE weakness and would benefit from continued strengthening.  They would continue to benefit from skilled PT to address impairments as noted and progress towards long term goals.  OBJECTIVE IMPAIRMENTS: Abnormal gait, cardiopulmonary status limiting  activity, decreased activity tolerance, decreased balance, decreased cognition, decreased coordination, decreased endurance, decreased knowledge of condition, decreased knowledge of use of DME, decreased mobility, difficulty walking, decreased strength, decreased safety awareness, increased fascial restrictions, impaired flexibility, impaired sensation, impaired tone, impaired UE functional use, impaired vision/preception, improper body mechanics, and pain.   ACTIVITY LIMITATIONS: carrying, lifting, bending, standing, squatting, sleeping, stairs, transfers, locomotion level, and caring for others  PARTICIPATION LIMITATIONS: meal prep, cleaning, laundry, medication management, driving, shopping, community activity, occupation, and yard work  PERSONAL FACTORS: Age, Time since onset of injury/illness/exacerbation, and 1-2 comorbidities: CHF, hx of CVA, ESRD are also affecting patient's functional outcome.   REHAB POTENTIAL: Good  CLINICAL DECISION MAKING: Evolving/moderate complexity  EVALUATION COMPLEXITY: Moderate  PLAN:  PT FREQUENCY: 2x/week  PT DURATION: 8 weeks  PLANNED INTERVENTIONS: [id number]- PT Re-evaluation, 97750- Physical Performance Testing, 97110-Therapeutic exercises, 97530- Therapeutic activity, 97112- Neuromuscular re-education, 97535- Self Care, [id number]- Manual therapy, [phone]- Gait training,  [phone]- Orthotic/Prosthetic subsequent, [phone]- Aquatic Therapy, [phone]- Electrical stimulation (unattended), Patient/Family education, Balance training, Stair training, Taping, Joint mobilization, Spinal mobilization, Cognitive remediation, DME instructions, Wheelchair mobility training, Cryotherapy, and Moist heat  PLAN FOR NEXT SESSION: SLP referral?; higher level balance with cognitive tasks/dual tasks; [employer]; challenge awareness of L side, L hip abd strengthening, quadruped, half kneel   Damien Caulk, PT 01/02/2024, 7:21 PM

## 2024-01-04 ENCOUNTER — Ambulatory Visit (HOSPITAL_COMMUNITY)
Admission: RE | Admit: 2024-01-04 | Discharge: 2024-01-04 | Disposition: A | Discharge status: Home / Self Care | Source: Ambulatory Visit | Attending: Nephrology | Admitting: Nephrology

## 2024-01-04 DIAGNOSIS — N281 Cyst of kidney, acquired: Secondary | ICD-10-CM | POA: Diagnosis not present

## 2024-01-04 DIAGNOSIS — R3915 Urgency of urination: Secondary | ICD-10-CM | POA: Diagnosis present

## 2024-01-06 ENCOUNTER — Ambulatory Visit

## 2024-01-06 ENCOUNTER — Ambulatory Visit: Admitting: Physical Therapy

## 2024-01-06 ENCOUNTER — Encounter: Payer: Self-pay | Admitting: Physical Therapy

## 2024-01-06 DIAGNOSIS — I63411 Cerebral infarction due to embolism of right middle cerebral artery: Secondary | ICD-10-CM | POA: Diagnosis not present

## 2024-01-06 DIAGNOSIS — M6281 Muscle weakness (generalized): Secondary | ICD-10-CM

## 2024-01-06 DIAGNOSIS — R29898 Other symptoms and signs involving the musculoskeletal system: Secondary | ICD-10-CM

## 2024-01-06 DIAGNOSIS — R278 Other lack of coordination: Secondary | ICD-10-CM

## 2024-01-06 DIAGNOSIS — R2689 Other abnormalities of gait and mobility: Secondary | ICD-10-CM

## 2024-01-06 NOTE — Therapy (Signed)
 OUTPATIENT PHYSICAL THERAPY NEURO TREATMENT   Patient Name: Nathan Campbell MRN: [id number] DOB:21-Jan-1976, 48 y.o., male Today's Date: 01/06/2024   PCP: Pt reports PCP is Blad but did not give any further identifying information. REFERRING PROVIDER: Sebastian Jerilyn HERO, FNP  END OF SESSION:  PT End of Session - 01/06/24 1415     Visit Number 5    Number of Visits 17   16 plus Eval   Date for Recertification  02/24/24   For scheduling delays   Authorization Type [facility] Medicare    Authorization Time Period 12/26/23 - 02/20/24    Authorization - Visit Number 5    Authorization - Number of Visits 17    PT Start Time 1407    PT Stop Time 1438    PT Time Calculation (min) 31 min    Equipment Utilized During Treatment Gait belt    Activity Tolerance Patient tolerated treatment well    Behavior During Therapy WFL for tasks assessed/performed          Past Medical History:  Diagnosis Date   Acute combined systolic and diastolic CHF, NYHA class 4 (HCC) 06/10/2016   Anemia    Cardiomyopathy (HCC) 06/14/2016   CHF (congestive heart failure) (HCC)    COVID 10/2020   Dyspnea    ESRD on hemodialysis (HCC)    TTS at [facility]   Hypertension    Hypertensive heart and kidney disease with heart failure (HCC) 06/14/2016   Hypertensive heart disease with congestive heart failure (HCC)    Obesity    Past Surgical History:  Procedure Laterality Date   A/V FISTULAGRAM Left 05/03/2022   Procedure: A/V Fistulagram;  Surgeon: Eliza Lonni RAMAN, MD;  Location: [facility] INVASIVE CV LAB;  Service: Cardiovascular;  Laterality: Left;   AV FISTULA PLACEMENT Left 11/11/2020   Procedure: LEFT ARM First Stage Basilic Vein Fistula Creation;  Surgeon: Sheree Penne Lonni, MD;  Location: [facility] OR;  Service: Vascular;  Laterality: Left;   BASCILIC VEIN TRANSPOSITION Left 01/23/2021   Procedure: Second Stage Basilic Vein Transposition of Left Arm Arteriovenous  Fistula;  Surgeon: Sheree Penne Lonni, MD;  Location: [facility] OR;  Service: Vascular;  Laterality: Left;  Block  to LMA    COLONOSCOPY     FISTULA SUPERFICIALIZATION Left 05/07/2022   Procedure: PLICATION OF ANEURYSM, LEFT ARM FISTULA;  Surgeon: Eliza Lonni RAMAN, MD;  Location: [facility] OR;  Service: Vascular;  Laterality: Left;   IR CT HEAD LTD  10/07/2023   IR CT HEAD LTD  10/07/2023   IR PATIENT EVAL TECH 0-60 MINS  10/07/2023   IR PERCUTANEOUS ART THROMBECTOMY/INFUSION INTRACRANIAL INC DIAG ANGIO  10/07/2023   NO PAST SURGERIES     PERIPHERAL VASCULAR BALLOON ANGIOPLASTY Left 05/03/2022   Procedure: PERIPHERAL VASCULAR BALLOON ANGIOPLASTY;  Surgeon: Eliza Lonni RAMAN, MD;  Location: [facility] INVASIVE CV LAB;  Service: Cardiovascular;  Laterality: Left;  AVF   RADIOLOGY WITH ANESTHESIA N/A 10/07/2023   Procedure: RADIOLOGY WITH ANESTHESIA;  Surgeon: Radiologist, Medication, MD;  Location: [facility] OR;  Service: Radiology;  Laterality: N/A;   Patient Active Problem List   Diagnosis Date Noted   Stroke (HCC) 11/04/2023   Cognitive and neurobehavioral dysfunction 10/24/2023   Acute hypoxic respiratory failure (HCC) 10/17/2023   Essential hypertension 10/17/2023   Dysphagia 10/17/2023   Leukocytosis 10/17/2023   Obesity 10/17/2023   Acute ischemic stroke (HCC) 10/07/2023   Middle cerebral artery embolism, right 10/07/2023   ESRD (end stage renal disease) (HCC) 03/26/2022   Hyperlipidemia,  unspecified 03/20/2021   Acute gout due to renal impairment involving left wrist 04/24/2019   Malignant hypertension 10/14/2016   Combined congestive systolic and diastolic heart failure (HCC) 06/14/2016   NICM (nonischemic cardiomyopathy) (HCC) 06/14/2016   Renal failure    Hypertensive urgency 06/10/2016   CKD (chronic kidney disease), stage IV (HCC) 06/10/2016   Normocytic anemia 06/10/2016    ONSET DATE: 12/14/2023- date of referral  REFERRING DIAG: G81.90 (ICD-10-CM) - Hemiparesis (HCC)  THERAPY DIAG:  Other lack of  coordination  Muscle weakness (generalized)  Other symptoms and signs involving the musculoskeletal system  Other abnormalities of gait and mobility  Cerebrovascular accident (CVA) due to embolism of right middle cerebral artery (HCC)  Rationale for Evaluation and Treatment: Rehabilitation  SUBJECTIVE:  Likes to be called LC.                                                                                                                                                                                            SUBJECTIVE STATEMENT: Pt reports he still walks the dog daily and does his HEP.  Denies falls or pain or acute status changes.  Continues ambulating w/o AD. Pt accompanied by: self.  Mom drove pt.  PERTINENT HISTORY: Hospitalization 10/07/23 for acute onset of left-sided weakness, left side neglect, dysarthria and right gaze preference; imaging showing acute right MCA CVA with right M1 occlusion (intubated for airway protection; s/p TNK; s/p revascularization of R MCA with IR 7/25).  IP rehab stay 10/18/23-11/04/23. Urgent care visit 12/02/23 for gout (L foot).  PMH includes: gout, CKD, ESRD on HD T/R/Sat, CHF, anemia, htn, stroke 10/07/23, acute hypoxic respiratory failure, dysphagia, cardiomyopathy, and obesity (BMI 41).  PAIN:  Are you having pain? Yes: NPRS scale: 0/10 currently Pain location: L shoulder Pain description: Aching Aggravating factors: Laying down Relieving factors: Muscle relaxer  PRECAUTIONS: Fall; fistula in LUE (dialysis T/Th/Sat)  RED FLAGS: None   WEIGHT BEARING RESTRICTIONS: No  FALLS: Has patient fallen in last 6 months? No  LIVING ENVIRONMENT: Lives with: Sister and her 2 sons; mom currently staying with pt to assist as needed Lives in: Other Townhouse--stays on 2nd floor Stairs: Yes: Internal: 5 steps; on right going up and External: 0 steps; none Has following equipment at home: Crutches  PLOF: Independent  PATIENT GOALS: To get more function  in arm; get voice back; get strength up.  OBJECTIVE:  Note: Objective measures were completed at Evaluation unless otherwise noted.  COGNITION: Overall cognitive status: A&Ox4; possible decreased awareness of deficits  COORDINATION:  Rapid Alternating toe taps (in sitting): Decreased L LE  Heel to shin (in  sitting); mild decreased L LE  LOWER EXTREMITY ROM:     Active  Right Eval Left Eval  Hip flexion WNL WNL  Hip extension    Hip abduction    Hip adduction    Hip internal rotation    Hip external rotation    Knee flexion WNL WNL  Knee extension WNL WNL  Ankle dorsiflexion WNL WNL  Ankle plantarflexion WNL WNL  Ankle inversion    Ankle eversion     (Blank rows = not tested)  LOWER EXTREMITY MMT:    MMT Right Eval Left Eval  Hip flexion 5/5 4+/5  Hip extension    Hip abduction    Hip adduction    Hip internal rotation    Hip external rotation    Knee flexion 5/5 4+/5  Knee extension 5/5 5/5  Ankle dorsiflexion 5/5 4+/5  Ankle plantarflexion At least 3/5 AROM At least 3/5 AROM  Ankle inversion    Ankle eversion    (Blank rows = not tested) GAIT: Findings: Gait Characteristics: step through pattern, decreased arm swing- Left, decreased ankle dorsiflexion- Left, and wide BOS, Distance walked: clinic distances, Assistive device utilized:None, Level of assistance: SBA, and Comments: pt tending to walk close to items on L side but did not bump into anything  FUNCTIONAL TESTS:  5 times sit to stand: 20.81 seconds 10 meter walk test: 1.25 m/sec (7.97 seconds); no AD use Functional gait assessment: 18/30 (12/26/23) SLS: R LE 1st 2 trials about 1 second each but on 3rd trial 9.0 seconds; L LE 1st 2 trials about 1 second each but on 3rd trial 30 seconds L LE (time stopped by therapist at 30 seconds) TUG (Manual): 9.41 seconds 12/26/23 TUG (Cognitive): 26.25 seconds 12/26/23  FUNCTIONAL GAIT ASSESSMENT  Date: 12/26/23 Score  GAIT LEVEL SURFACE Instructions: Walk at  your normal speed from here to the next mark (6 m) [20 ft]. (1) Moderate impairment-Walks 6 m (20 ft), slow speed, abnormal gait pattern, evidence for imbalance, or deviates 25.4 - 38.1 cm (10 -15 in) outside of the 30.48-cm (12-in) walkway width. Requires more than 7 seconds to ambulate 6 m (20 ft). 8.53 seconds  2.   CHANGE IN GAIT SPEED Instructions: Begin walking at your normal pace (for 1.5 m [5 ft]). When I tell you "go," walk as fast as you can (for 1.5 m [5 ft]). When I tell you "slow," walk as slowly as you can (for 1.5 m [5 ft]. (2) Mild impairment - Is able to change speed but demonstrates mild gait deviations, deviates 15.24 -25.4 cm (6 -10 in) outside of the 30.48-cm (12-in) walkway width, or no gait deviations but unable to achieve a significant change in velocity, or uses an assistive device  3.    GAIT WITH HORIZONTAL HEAD TURNS Instructions: Walk from here to the next mark 6 m (20 ft) away. Begin walking at your normal pace. Keep walking straight; after 3 steps, turn your head to the right and keep walking straight while looking to the right. After 3 more steps, turn your head to the left and keep walking straight while looking left. Continue alternating looking right and left. (2) Mild impairment - Performs head turns smoothly with slight change in gait velocity (eg, minor disruption to smooth gait path), deviates 15.24 -25.4 cm (6 -10 in) outside 30.48-cm (12-in) walkway width, or uses an assistive device.  4.   GAIT WITH VERTICAL HEAD TURNS Instructions: Walk from here to the next mark (6 m [20  ft]). Begin walking at your normal pace. Keep walking straight; after 3 steps, tip your head up and keep walking straight while looking up. After 3 more steps, tip your head down, keep walking straight while looking down. Continue  alternating looking up and down every 3 steps until you have completed 2 repetitions in each direction. (2) Mild impairment - Performs task with slight change in gait  velocity (eg, minor disruption to smooth gait path), deviates 15.24 -25.4 cm (6 -10 in) outside 30.48-cm (12-in) walkway width or uses assistive device.  5.  GAIT AND PIVOT TURN Instructions: Begin with walking at your normal pace. When I tell you, "turn and stop," turn as quickly as you can to face the opposite direction and stop. (2) Mild impairment - Pivot turns safely in 3 seconds and stops with no loss of balance, or pivot turns safely within 3 seconds and stops with mild imbalance, requires small steps to catch balance  6.   STEP OVER OBSTACLE Instructions: Begin walking at your normal speed. When you come to the shoe box, step over it, not around it, and keep walking. (2) Mild impairment - Is able to step over one shoe box (11.43 cm [4.5 in] total height) without changing gait speed; no evidence of imbalance.  7.   GAIT WITH NARROW BASE OF SUPPORT Instructions: Walk on the floor with arms folded across the chest, feet aligned heel to toe in tandem for a distance of 3.6 m [12 ft]. The number of steps taken in a straight line are counted for a maximum of 10 steps. (2) Mild impairment - Ambulates 7-9 steps  8.   GAIT WITH EYES CLOSED Instructions: Walk at your normal speed from here to the next mark (6 m [20 ft]) with your eyes closed. (2) Mild impairment - Walks 6 m (20 ft), uses assistive device, slower speed, mild gait deviations, deviates 15.24 -25.4 cm (6 -10 in) outside 30.48-cm (12-in) walkway width. Ambulates 6 m (20 ft) in less than 9 seconds but greater than 7 seconds 7.72 seconds  9.   AMBULATING BACKWARDS Instructions: Walk backwards until I tell you to stop (2) Mild impairment - Walks 6 m (20 ft), uses assistive device, slower speed, mild gait deviations, deviates 15.24 -25.4 cm (6 -10 in) outside 30.48-cm (12-in) walkway width12.06 seconds  10. STEPS Instructions: Walk up these stairs as you would at home (ie, using the rail if necessary). At the top turn around and walk down. (1)  Moderate impairment-Two feet to a stair; must use rail.  Total 18/30   Interpretation of scores: Non-Specific Older Adults Cutoff Score: <=22/30 = risk of falls Parkinson's Disease Cutoff score <15/30= fall risk (Hoehn & Yahr 1-4)  Minimally Clinically Important Difference (MCID)  Stroke (acute, subacute, and chronic) = MDC: 4.2 points Vestibular (acute) = MDC: 6 points Community Dwelling Older Adults =  MCID: 4 points Parkinson's Disease  =  MDC: 4.3 points  (Academy of Neurologic Physical Therapy (nd). Functional Gait Assessment. Retrieved from https://www.neuropt.org/docs/default-source/cpgs/core-outcome-measures/function-gait-assessment-pocket-guide-proof9-(2).pdf?sfvrsn=b55f35043_0.)   PATIENT SURVEYS:  Stroke Impact Scale : TBA  TREATMENT DATE: 01/06/2024 NMR: L fwd half kneel:  8lb bimanual chest press x10 > RUE 5lb overhead press x10 R fwd half kneel:  8 lb bimanual chest press x10 > LUE active assist by therapist for fwd 5lb punch w/ assisted grip x10 8lb bimanual hold from heel sit to tall kneel x10 6 Blaze pods on random one color taps setting for improved visual scanning, stance stability, and reaction time.  Performed on 1 minute intervals with 30 second rest periods.  Pt requires SBA guarding. Round 1:  semi-circular floor pod setup.  20 hits. Round 2:   setup.  32 hits. Round 3:   setup.  36 hits. Notable errors/deficits:  Cues to scan left.  -x20 reps of 8 cone midline taps w/ light BUE support -Pt ambulates roughly 80 ft from gym into parking lot SBA managing curb step w/o LOB, some instability w/ tight turns to right  PATIENT EDUCATION: Education details: Continue HEP. Person educated: Patient Education method: Explanation, Demonstration, and Verbal cues Education comprehension: verbalized understanding  HOME EXERCISE PROGRAM:  12/23/23 Access Code: [email or web address] URL: [email or web address] Date: 12/23/2023 Prepared by: Damien Caulk  Exercises - Standing March with Counter Support  - 1 x daily - 5 x weekly - 2 sets - 10 reps - Standing Hip Abduction with Counter Support  - 1 x daily - 5 x weekly - 2 sets - 10 reps - Standing Single Leg Stance with Counter Support  - 1 x daily - 5 x weekly - 2 sets - 1 reps - 30 second hold  GOALS: Goals reviewed with patient? Yes  SHORT TERM GOALS: Target date: 01/20/2024  Patient will demo at least 50% compliance with HEP to self manage symptoms.  Baseline: Goal status: INITIAL  2.  Assess FGA Baseline: 18/30 (12/26/23) Goal status: MET  3.  Assess TUG (Cognitive) Baseline: 26.25 seconds Goal status: MET   LONG TERM GOALS: Target date: 02/17/2024   Pt will decrease 5 Time Sit to Stand by at least 3 seconds in order to demonstrate clinically significant improvement in LE strength.  Baseline: 20.81 seconds (Eval) Goal status: INITIAL  2.  Pt will decrease TUG (Cognitive) by at least (or equal to) 3 seconds in order to demonstrate decreased fall risk. Baseline: 26.25 seconds  Goal status: INITIAL  3.  Patient will demo at least 5 points of improvement on FGA to improve functional balance and reduce fall risk. Baseline: 18/30 (12/26/23) Goal status: INITIAL  4.  Pt will be independent with final HEP in order to improve strength and balance, to decrease fall risk, and improve function at home for ADL's and at work. Baseline:  Goal status: INITIAL   ASSESSMENT:  CLINICAL IMPRESSION: Patient was seen today for physical therapy treatment to address hip and core engagement to improve upright mobility and stability.  He does well with bimanual tasks in half kneel demonstrating functional limitations in LUE ROM, but great muscular engagement.  Pt is able to ambulate variable parking lot distances at no more than SBA managing curbs well, but tight turns may  be beneficial to practice in the future.  Session was concluded early due to clinic emergency.  Pt was safely handed off to mother in parking lot.  OBJECTIVE IMPAIRMENTS: Abnormal gait, cardiopulmonary status limiting activity, decreased activity tolerance, decreased balance, decreased cognition, decreased coordination, decreased endurance, decreased knowledge of condition, decreased knowledge of use of DME, decreased mobility, difficulty walking, decreased strength, decreased safety awareness, increased fascial restrictions, impaired flexibility, impaired sensation,  impaired tone, impaired UE functional use, impaired vision/preception, improper body mechanics, and pain.   ACTIVITY LIMITATIONS: carrying, lifting, bending, standing, squatting, sleeping, stairs, transfers, locomotion level, and caring for others  PARTICIPATION LIMITATIONS: meal prep, cleaning, laundry, medication management, driving, shopping, community activity, occupation, and yard work  PERSONAL FACTORS: Age, Time since onset of injury/illness/exacerbation, and 1-2 comorbidities: CHF, hx of CVA, ESRD are also affecting patient's functional outcome.   REHAB POTENTIAL: Good  CLINICAL DECISION MAKING: Evolving/moderate complexity  EVALUATION COMPLEXITY: Moderate  PLAN:  PT FREQUENCY: 2x/week  PT DURATION: 8 weeks  PLANNED INTERVENTIONS: [id number]- PT Re-evaluation, 97750- Physical Performance Testing, 97110-Therapeutic exercises, 97530- Therapeutic activity, 97112- Neuromuscular re-education, 97535- Self Care, [id number]- Manual therapy, [phone]- Gait training, [phone]- Orthotic/Prosthetic subsequent, [phone]- Aquatic Therapy, [phone]- Electrical stimulation (unattended), Patient/Family education, Balance training, Stair training, Taping, Joint mobilization, Spinal mobilization, Cognitive remediation, DME instructions, Wheelchair mobility training, Cryotherapy, and Moist heat  PLAN FOR NEXT SESSION: SLP referral?; higher level balance with  cognitive tasks/dual tasks; BlazePods; challenge awareness of L side, L hip abd strengthening, quadruped, half kneel, turning   Daved KATHEE Bull, PT, DPT 01/06/2024, 3:25 PM

## 2024-01-09 NOTE — Progress Notes (Signed)
 [facility]   Department of Urology  Chief Complaint:  1. Left renal mass   2. ESRD on hemodialysis (HCC)   3. CKD (chronic kidney disease), stage IV (HCC)        HPI History of Present Illness The patient presents for evaluation of a left renal cyst. He is accompanied by his mother.  The patient has been undergoing hemodialysis for the past 3 years. He was removed from the kidney transplant list following a cerebrovascular accident (CVA) on 10/06/2023. It was recommended that he undergo magnetic resonance imaging (MRI) prior to reconsideration for the transplant list. The CVA resulted in paresthesia of the arm, without involvement of the leg. He is currently participating in physical therapy, with 8 weeks remaining in the program. Post-stroke, he has experienced dysarthria and is receiving speech, occupational, and physical therapy twice weekly. The etiology of the stroke remains undetermined.  The patient has no history of diabetes mellitus or abdominal surgeries. He retains his gallbladder and appendix. He has a healthy daughter and is currently single. 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  SOCIAL HISTORY Does not smoke. On medical leave from sanitation job.  FAMILY HISTORY He reports no family history of cancer.  Anticoagulation:  Portions of the patient's history reviewed as below: Medical History[1]  Surgical History[2]  Allergies[3]    Current Medications[4]  Family History[5]  Social History[6]  Pertinent Labs:   Results for orders placed or performed in visit on 11/07/23  HLA PRE-TRANSPLANT ANTIBODY SCREEN (IN-HOUSE)   Collection Time: 11/07/23 11:06 AM  Result Value Ref Range   Cumulative Antibodies None    Phenotype      A34 A68 B53 B- Bw4 Bw- Cw4 Cw- DR15 DR17 DR51 DR52 DQA1*01:02 DQA1*05:01 DQ2 DQ6 DPA1*02:02 DPA1*03:05Q DPB1*01:01P DPB1*40:01   Sample Draw date 2023-11-07    Class I Antibody  Specificities Negative    Class II Antibody Specificities Negative    Disclaimer (FDA)      HLA typing is performed by intermediate to high resolution SSP or NGS.   Crossmatches are performed by flow cytometry.  All procedures and reagents have been validated and performance characteristics determined by the HLA/Immunogenetics Laboratory.   Certain of these tests have not been cleared/approved by the U.S. Food and Drug Administration.  The FDA has determined that such approval is not necessary because this laboratory is certified under the Clinical Laboratory Improvement Amendments to  perform high complexity testing.  Additional information may be obtained by contacting the laboratory.   Disclaimer Regulatory Affairs Officer)      THIS LABORATORY IS CERTIFIED UNDER THE CLINICAL LABORATORY IMPROVEMENT AMENDMENTS OF 1988 (CLIA) AS QUALIFIED TO PERFORM HIGH COMPLEXITY CLINICAL TESTING. Reviewed and approved by: Michael D. Lamonte, Ph.D., F.ACHI Director, HLA/Immunogenetics and  [facility] Laboratory.   Screening Test date 2023-11-18     No results found for this or any previous visit (from the past 24 hours).  No components found for: PSAG     Pertinent Imaging:  CT Outside Images Non Result Result Date: 01/10/2024 These images were imported for review and or comparison.  CT Outside Images Non Result Result Date: 01/10/2024 These images were imported for review and or comparison.  CT Outside Images Non Result Result Date: 01/10/2024 These images were imported for review and or comparison.  CT Outside Images Non Result Result Date: 01/10/2024 These images were imported for review and or comparison.  CT Outside Images Non Result Result Date:  01/10/2024 These images were imported for review and or comparison.  CT Outside Images Non Result Result Date: 01/10/2024 These images were imported for review and or comparison.  US  Outside Images Non Result Result Date:  01/10/2024 These images were imported for review and or comparison.  MRI Abdomen W And WO Contrast Result Date: 01/09/2024 MRI ABDOMEN W AND WO CONTRAST, 01/09/2024 11:17 AM INDICATION: Lesion on MR w/o, contrasted exam needed for further evaluation. Pre Kidney Txp Patient., Lesion on MR w/o, contrasted exam needed for further evaluation. Pre Kidney Txp Patient., Encounter for other preprocedural examination \ Z01.818 Encounter for other preprocedural examination \ N18.6 End stage renal disease \ N18.6 End stage renal disease \ Z99.2 Dependence on renal dialysis Lesion on MR w/o COMPARISON: MR abdomen 06/27/2023, CT 01/18/2022. TECHNIQUE: Multi-planar, multi-sequence MR images of the abdomen were obtained prior to and after intravenous administration of gadolinium-based contrast. FINDINGS: . Lower Chest: Within normal limits. .  Liver: No suspicious hepatic lesions. Hepatic steatosis. Signal changes related to iron  deposition. .  Gallbladder/Biliary: Within normal limits. .  Spleen: Signal changes related to iron  deposition. Otherwise unremarkable. .  Pancreas: Within normal limits. .  Adrenals: Similar left adrenal myelolipoma measuring up to 2 cm. .  Kidneys: Atrophic native kidneys with numerous small cysts. Redemonstrated left upper pole cystic lesion measuring 3.5 x 3.9 x 3.2 cm. This lesion is predominantly T1 hyperintense and T2 hypointense, with speckled areas of T2 signal hyperintensity. There are areas of thin wispy enhancement within this lesion, however sensitivity for underlying enhancement is reduced given the degree of intrinsic T1 signal hyperintensity. The inner margin of this lesion roughly abuts the renal sinus. .  Peritoneum/Mesenteries/Extraperitoneum: No significant ascites or lymphadenopathy. .  Gastrointestinal tract: No evidence of obstruction. Colonic diverticulosis. .  Vascular: Within normal limits. .  Musculoskeletal: Iron  deposition is noted throughout the bone marrow.   1.  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 2.  Iron  deposition throughout the reticuloendothelial system. 3.  Ancillary findings as above.   US  RENAL Result Date: 01/04/2024 CLINICAL DATA:  Urinary urgency EXAM: RENAL / URINARY TRACT ULTRASOUND COMPLETE COMPARISON:  Renal ultrasound June 10, 2016 FINDINGS: Right Kidney: Renal measurements: 11.6 x 6 x 4.7 cm = volume: 169 mL. Echogenicity is increased. Few cortical cysts measuring 1.7 and 2.2 cm in interpolar region. No solid mass or hydronephrosis visualized. Left Kidney: Renal measurements: 10.1 x 5 x 5.5 cm = volume: 143 mL. Echogenicity is increased. Cortical cysts measuring 5.5 and 2.6 cm in upper and interpolar region respectively. No solid mass or hydronephrosis visualized. Bladder: Not visualized due to inadequate distention. Other: None. IMPRESSION: Increased parenchymal renal echogenicity which can be seen the setting of medical renal disease. No hydronephrosis. Multiple bilateral renal cortical cysts. Correlate with renal function tests. Nonvisualization of the bladder. Electronically Signed   By: Megan  Zare M.D.   On: 01/04/2024 19:06     ROS:10 systems reviewed and were negative other than noted in the HPI  Physical Exam:   Vitals:   01/09/24 1307  BP: (!) 147/98  Pulse:   Temp:   SpO2:     GEN: No acute distress, alert and oriented  , has some difficulty but overall comprehensible.  Had some drooping during part of the injury C/V: Regular rate, hemodynamically stable  PULM: Normal work of breathing, good chest rise and fall ABDOMEN: Soft, non-distended EXTREMITIES: Warm, well perfused,  moving all extremities spontaneously NEURO: Grossly in tact SKIN: Warm, dry      Assessment / Plan: Assessment & Plan  The patient has been undergoing hemodialysis for the past 3 years. He was removed from the kidney transplant list following a  cerebrovascular accident (CVA) on 10/06/2023. It was recommended that he undergo magnetic resonance imaging (MRI) prior to reconsideration for the transplant list. The CVA resulted in paresthesia of the arm, without involvement of the leg. He is currently participating in physical therapy, with 8 weeks remaining in the program. Post-stroke, he has experienced dysarthria and is receiving speech, occupational, and physical therapy twice weekly. The etiology of the stroke remains undetermined.  MRI has been obtained however the result is pending.  I have personally called radiology reading room and they will update the imaging.  There is a wet read was that it was not a significant pathology    Plan Awaiting MRI   Electronically signed by:  Lauraine Fleming, MD 01/09/2024 2:52 PM       [1] Past Medical History: Diagnosis Date  . ESRD on hemodialysis    (CMD)   . HTN (hypertension)   . Morbid obesity with BMI of 40.0-44.9, adult (CMD)   . NICM (nonischemic cardiomyopathy)    (CMD)   . Stroke    (CMD) 10/06/2023  [2] Past Surgical History: Procedure Laterality Date  . AV FISTULA PLACEMENT     Procedure: AV FISTULA PLACEMENT  [3] No Known Allergies [4]  Current Outpatient Medications:  .  aspirin  81 mg chewable tablet, Chew 81 mg daily., Disp: , Rfl:  .  atorvastatin  (LIPITOR) 40 mg tablet, Take 40 mg by mouth daily., Disp: , Rfl:  .  calcium  carbonate (OS-CAL) 500 mg calcium  (1,250 mg) chewable tablet, Take  by mouth., Disp: , Rfl:  .  camphor-menthoL  0.5-0.5 % lotion, Apply topically as needed., Disp: , Rfl:  .  carvediloL  (COREG ) 25 mg tablet, Take 25 mg by mouth 2 (two) times a day., Disp: , Rfl:  .  clopidogreL  (PLAVIX ) 75 mg tablet, Take 75 mg by mouth daily., Disp: , Rfl:  .  doxercalciferol  (HECTOROL  IV), Infuse 8 mcg into a venous catheter. doxercalciferol  (hectorol ), Disp: , Rfl:  .  hydrOXYzine  (ATARAX ) 10 mg tablet, Take 10 mg by mouth 3 (three) times a day as needed for  itching., Disp: , Rfl:  .  isosorbide  mononitrate (IMDUR ) 60 mg 24 hr tablet, Take 60 mg by mouth Once Daily., Disp: , Rfl:  .  midodrine  (PROAMATINE ) 5 mg tablet, Take 5 mg by mouth., Disp: , Rfl:  .  ondansetron  (ZOFRAN ) 4 mg tablet, Take 4 mg by mouth every 6 (six) hours as needed for nausea or vomiting., Disp: , Rfl:  .  pantoprazole  (PROTONIX ) 40 mg EC tablet, Take 40 mg by mouth at bedtime., Disp: , Rfl:  .  sennosides-docusate sodium  (PERICOLACE) 8.6-50 mg per tablet, Take 2 tablets by mouth nightly., Disp: , Rfl:  .  torsemide  (DEMADEX ) 20 mg tablet, Take 60 mg by mouth 2 (two) times a day., Disp: , Rfl:  .  witch hazel-glycerin  (TUCKS) padm pad, Apply topically as needed. apply as needed, Disp: , Rfl:  .  allopurinoL  (ZYLOPRIM ) 100 mg tablet, Take 100 mg by mouth. (Patient not taking: Reported on 01/09/2024), Disp: , Rfl:  .  amLODIPine  (NORVASC ) 10 mg tablet, Take 10 mg by mouth Once Daily. (Patient not taking: Reported on 01/09/2024), Disp: , Rfl:  .  cyclobenzaprine  (FLEXERIL ) 5 mg  tablet, Take 5 mg by mouth nightly. (Patient not taking: Reported on 01/09/2024), Disp: , Rfl:  .  diclofenac  sodium (VOLTAREN ) 1 % gel, Apply  topically. (Patient not taking: Reported on 01/09/2024), Disp: , Rfl:  [5] Family History Problem Relation Name Age of Onset  . Hypertension Mother    . Kidney disease Father    . Diabetes Father    . Hypertension Sister    . Hypertension Paternal Grandmother    . Diabetes Paternal Grandfather    [6] Social History Socioeconomic History  . Marital status: Single  Tobacco Use  . Smoking status: Former    Current packs/day: 0.00    Types: Cigarettes    Quit date: 03/15/2020    Years since quitting: 3.8  . Smokeless tobacco: Never  Substance and Sexual Activity  . Alcohol use: Never  . Drug use: Never   Social Drivers of Health   Food Insecurity: No Food Insecurity (10/27/2023)   Received from [facility]   Food vital sign   . Within the past 12  months, you worried that your food would run out before you got money to buy more: Never true   . Within the past 12 months, the food you bought just didn't last and you didn't have money to get more: Never true  Transportation Needs: No Transportation Needs (10/27/2023)   Received from [facility] - Transportation   . In the past 12 months, has lack of transportation kept you from medical appointments or from getting medications?: No   . In the past 12 months, has lack of transportation kept you from meetings, work, or from getting things needed for daily living?: No  Safety: Not At Risk (10/27/2023)   Received from [facility]   Safety   . Within the last year, have you been afraid of your partner or ex-partner?: No   . Within the last year, have you been humiliated or emotionally abused in other ways by your partner or ex-partner?: No   . Within the last year, have you been kicked, hit, slapped, or otherwise physically hurt by your partner or ex-partner?: No   . Within the last year, have you been raped or forced to have any kind of sexual activity by your partner or ex-partner?: No  Living Situation: Low Risk  (10/27/2023)   Received from [facility] Situation   . In the last 12 months, was there a time when you were not able to pay the mortgage or rent on time?: No   . In the past 12 months, how many times have you moved where you were living?: 0   . At any time in the past 12 months, were you homeless or living in a shelter (including now)?: No

## 2024-01-11 ENCOUNTER — Ambulatory Visit: Admitting: Occupational Therapy

## 2024-01-11 ENCOUNTER — Encounter: Payer: Self-pay | Admitting: Physical Therapy

## 2024-01-11 ENCOUNTER — Ambulatory Visit: Admitting: Physical Therapy

## 2024-01-11 VITALS — BP 145/96 | HR 109

## 2024-01-11 DIAGNOSIS — R2689 Other abnormalities of gait and mobility: Secondary | ICD-10-CM

## 2024-01-11 DIAGNOSIS — M6281 Muscle weakness (generalized): Secondary | ICD-10-CM

## 2024-01-11 DIAGNOSIS — I63411 Cerebral infarction due to embolism of right middle cerebral artery: Secondary | ICD-10-CM

## 2024-01-11 DIAGNOSIS — R29818 Other symptoms and signs involving the nervous system: Secondary | ICD-10-CM

## 2024-01-11 DIAGNOSIS — R278 Other lack of coordination: Secondary | ICD-10-CM

## 2024-01-11 NOTE — Therapy (Signed)
 OUTPATIENT PHYSICAL THERAPY NEURO TREATMENT   Patient Name: Izea Livolsi MRN: [id number] DOB:1975-10-11, 48 y.o., male Today's Date: 01/11/2024   PCP: Pt reports PCP is [facility] but did not give any further identifying information. REFERRING PROVIDER: Sebastian Jerilyn HERO, FNP  END OF SESSION:  PT End of Session - 01/11/24 0942     Visit Number 6    Number of Visits 17   16 plus Eval   Date for Recertification  02/24/24   For scheduling delays   Authorization Type [facility] Medicare    Authorization Time Period 12/26/23 - 02/20/24    Authorization - Visit Number 6    Authorization - Number of Visits 17    PT Start Time 0941   Pt arrived late   PT Stop Time 1015    PT Time Calculation (min) 34 min    Equipment Utilized During Treatment Gait belt    Activity Tolerance Patient tolerated treatment well    Behavior During Therapy WFL for tasks assessed/performed          Past Medical History:  Diagnosis Date   Acute combined systolic and diastolic CHF, NYHA class 4 (HCC) 06/10/2016   Anemia    Cardiomyopathy (HCC) 06/14/2016   CHF (congestive heart failure) (HCC)    COVID 10/2020   Dyspnea    ESRD on hemodialysis (HCC)    TTS at [facility]   Hypertension    Hypertensive heart and kidney disease with heart failure (HCC) 06/14/2016   Hypertensive heart disease with congestive heart failure (HCC)    Obesity    Past Surgical History:  Procedure Laterality Date   A/V FISTULAGRAM Left 05/03/2022   Procedure: A/V Fistulagram;  Surgeon: Eliza Lonni RAMAN, MD;  Location: [facility] INVASIVE CV LAB;  Service: Cardiovascular;  Laterality: Left;   AV FISTULA PLACEMENT Left 11/11/2020   Procedure: LEFT ARM First Stage Basilic Vein Fistula Creation;  Surgeon: Sheree Penne Lonni, MD;  Location: [facility] OR;  Service: Vascular;  Laterality: Left;   BASCILIC VEIN TRANSPOSITION Left 01/23/2021   Procedure: Second Stage Basilic Vein Transposition of Left Arm Arteriovenous  Fistula;  Surgeon:  Sheree Penne Lonni, MD;  Location: [facility] OR;  Service: Vascular;  Laterality: Left;  Block  to LMA    COLONOSCOPY     FISTULA SUPERFICIALIZATION Left 05/07/2022   Procedure: PLICATION OF ANEURYSM, LEFT ARM FISTULA;  Surgeon: Eliza Lonni RAMAN, MD;  Location: [facility] OR;  Service: Vascular;  Laterality: Left;   IR CT HEAD LTD  10/07/2023   IR CT HEAD LTD  10/07/2023   IR PATIENT EVAL TECH 0-60 MINS  10/07/2023   IR PERCUTANEOUS ART THROMBECTOMY/INFUSION INTRACRANIAL INC DIAG ANGIO  10/07/2023   NO PAST SURGERIES     PERIPHERAL VASCULAR BALLOON ANGIOPLASTY Left 05/03/2022   Procedure: PERIPHERAL VASCULAR BALLOON ANGIOPLASTY;  Surgeon: Eliza Lonni RAMAN, MD;  Location: [facility] INVASIVE CV LAB;  Service: Cardiovascular;  Laterality: Left;  AVF   RADIOLOGY WITH ANESTHESIA N/A 10/07/2023   Procedure: RADIOLOGY WITH ANESTHESIA;  Surgeon: Radiologist, Medication, MD;  Location: [facility] OR;  Service: Radiology;  Laterality: N/A;   Patient Active Problem List   Diagnosis Date Noted   Stroke (HCC) 11/04/2023   Cognitive and neurobehavioral dysfunction 10/24/2023   Acute hypoxic respiratory failure (HCC) 10/17/2023   Essential hypertension 10/17/2023   Dysphagia 10/17/2023   Leukocytosis 10/17/2023   Obesity 10/17/2023   Acute ischemic stroke (HCC) 10/07/2023   Middle cerebral artery embolism, right 10/07/2023   ESRD (end stage renal disease) (HCC)  03/26/2022   Hyperlipidemia, unspecified 03/20/2021   Acute gout due to renal impairment involving left wrist 04/24/2019   Malignant hypertension 10/14/2016   Combined congestive systolic and diastolic heart failure (HCC) 06/14/2016   NICM (nonischemic cardiomyopathy) (HCC) 06/14/2016   Renal failure    Hypertensive urgency 06/10/2016   CKD (chronic kidney disease), stage IV (HCC) 06/10/2016   Normocytic anemia 06/10/2016    ONSET DATE: 12/14/2023- date of referral  REFERRING DIAG: G81.90 (ICD-10-CM) - Hemiparesis (HCC)  THERAPY DIAG:  Other lack of  coordination  Muscle weakness (generalized)  Other abnormalities of gait and mobility  Cerebrovascular accident (CVA) due to embolism of right middle cerebral artery (HCC)  Rationale for Evaluation and Treatment: Rehabilitation  SUBJECTIVE:  Likes to be called LC.                                                                                                                                                                                            SUBJECTIVE STATEMENT: Pt reports no recent falls.  Today is his birthday.  No changes to status.  Going to get his own place to live soon.  Still walking his sisters dog (hopes to get his own dog once he gets his own place).  Has cysts on kidney and looking at getting back on transplant list. Pt accompanied by: self.  Mom drove pt.  PERTINENT HISTORY: Hospitalization 10/07/23 for acute onset of left-sided weakness, left side neglect, dysarthria and right gaze preference; imaging showing acute right MCA CVA with right M1 occlusion (intubated for airway protection; s/p TNK; s/p revascularization of R MCA with IR 7/25).  IP rehab stay 10/18/23-11/04/23. Urgent care visit 12/02/23 for gout (L foot).  PMH includes: gout, CKD, ESRD on HD T/R/Sat, CHF, anemia, htn, stroke 10/07/23, acute hypoxic respiratory failure, dysphagia, cardiomyopathy, and obesity (BMI 41).  PAIN:  Are you having pain? Yes: NPRS scale: 0/10 currently Pain location: L shoulder Pain description: Aching Aggravating factors: Laying down Relieving factors: Muscle relaxer  PRECAUTIONS: Fall; fistula in LUE (dialysis T/Th/Sat)  RED FLAGS: None   WEIGHT BEARING RESTRICTIONS: No  FALLS: Has patient fallen in last 6 months? No  LIVING ENVIRONMENT: Lives with: Sister and her 2 sons; mom currently staying with pt to assist as needed Lives in: Other Townhouse--stays on 2nd floor Stairs: Yes: Internal: 5 steps; on right going up and External: 0 steps; none Has following equipment at  home: Crutches  PLOF: Independent  PATIENT GOALS: To get more function in arm; get voice back; get strength up.  OBJECTIVE:  Note: Objective measures were completed at Evaluation unless otherwise noted.  COGNITION: Overall  cognitive status: A&Ox4; possible decreased awareness of deficits  COORDINATION:  Rapid Alternating toe taps (in sitting): Decreased L LE  Heel to shin (in sitting); mild decreased L LE  LOWER EXTREMITY ROM:     Active  Right Eval Left Eval  Hip flexion WNL WNL  Hip extension    Hip abduction    Hip adduction    Hip internal rotation    Hip external rotation    Knee flexion WNL WNL  Knee extension WNL WNL  Ankle dorsiflexion WNL WNL  Ankle plantarflexion WNL WNL  Ankle inversion    Ankle eversion     (Blank rows = not tested)  LOWER EXTREMITY MMT:    MMT Right Eval Left Eval  Hip flexion 5/5 4+/5  Hip extension    Hip abduction    Hip adduction    Hip internal rotation    Hip external rotation    Knee flexion 5/5 4+/5  Knee extension 5/5 5/5  Ankle dorsiflexion 5/5 4+/5  Ankle plantarflexion At least 3/5 AROM At least 3/5 AROM  Ankle inversion    Ankle eversion    (Blank rows = not tested) GAIT: Findings: Gait Characteristics: step through pattern, decreased arm swing- Left, decreased ankle dorsiflexion- Left, and wide BOS, Distance walked: clinic distances, Assistive device utilized:None, Level of assistance: SBA, and Comments: pt tending to walk close to items on L side but did not bump into anything  FUNCTIONAL TESTS:  5 times sit to stand: 20.81 seconds 10 meter walk test: 1.25 m/sec (7.97 seconds); no AD use Functional gait assessment: 18/30 (12/26/23) SLS: R LE 1st 2 trials about 1 second each but on 3rd trial 9.0 seconds; L LE 1st 2 trials about 1 second each but on 3rd trial 30 seconds L LE (time stopped by therapist at 30 seconds) TUG (Manual): 9.41 seconds 12/26/23 TUG (Cognitive): 26.25 seconds 12/26/23  FUNCTIONAL GAIT  ASSESSMENT  Date: 12/26/23 Score  GAIT LEVEL SURFACE Instructions: Walk at your normal speed from here to the next mark (6 m) [20 ft]. (1) Moderate impairment-Walks 6 m (20 ft), slow speed, abnormal gait pattern, evidence for imbalance, or deviates 25.4 - 38.1 cm (10 -15 in) outside of the 30.48-cm (12-in) walkway width. Requires more than 7 seconds to ambulate 6 m (20 ft). 8.53 seconds  2.   CHANGE IN GAIT SPEED Instructions: Begin walking at your normal pace (for 1.5 m [5 ft]). When I tell you "go," walk as fast as you can (for 1.5 m [5 ft]). When I tell you "slow," walk as slowly as you can (for 1.5 m [5 ft]. (2) Mild impairment - Is able to change speed but demonstrates mild gait deviations, deviates 15.24 -25.4 cm (6 -10 in) outside of the 30.48-cm (12-in) walkway width, or no gait deviations but unable to achieve a significant change in velocity, or uses an assistive device  3.    GAIT WITH HORIZONTAL HEAD TURNS Instructions: Walk from here to the next mark 6 m (20 ft) away. Begin walking at your normal pace. Keep walking straight; after 3 steps, turn your head to the right and keep walking straight while looking to the right. After 3 more steps, turn your head to the left and keep walking straight while looking left. Continue alternating looking right and left. (2) Mild impairment - Performs head turns smoothly with slight change in gait velocity (eg, minor disruption to smooth gait path), deviates 15.24 -25.4 cm (6 -10 in) outside 30.48-cm (12-in) walkway width,  or uses an assistive device.  4.   GAIT WITH VERTICAL HEAD TURNS Instructions: Walk from here to the next mark (6 m [20 ft]). Begin walking at your normal pace. Keep walking straight; after 3 steps, tip your head up and keep walking straight while looking up. After 3 more steps, tip your head down, keep walking straight while looking down. Continue  alternating looking up and down every 3 steps until you have completed 2 repetitions in each  direction. (2) Mild impairment - Performs task with slight change in gait velocity (eg, minor disruption to smooth gait path), deviates 15.24 -25.4 cm (6 -10 in) outside 30.48-cm (12-in) walkway width or uses assistive device.  5.  GAIT AND PIVOT TURN Instructions: Begin with walking at your normal pace. When I tell you, "turn and stop," turn as quickly as you can to face the opposite direction and stop. (2) Mild impairment - Pivot turns safely in 3 seconds and stops with no loss of balance, or pivot turns safely within 3 seconds and stops with mild imbalance, requires small steps to catch balance  6.   STEP OVER OBSTACLE Instructions: Begin walking at your normal speed. When you come to the shoe box, step over it, not around it, and keep walking. (2) Mild impairment - Is able to step over one shoe box (11.43 cm [4.5 in] total height) without changing gait speed; no evidence of imbalance.  7.   GAIT WITH NARROW BASE OF SUPPORT Instructions: Walk on the floor with arms folded across the chest, feet aligned heel to toe in tandem for a distance of 3.6 m [12 ft]. The number of steps taken in a straight line are counted for a maximum of 10 steps. (2) Mild impairment - Ambulates 7-9 steps  8.   GAIT WITH EYES CLOSED Instructions: Walk at your normal speed from here to the next mark (6 m [20 ft]) with your eyes closed. (2) Mild impairment - Walks 6 m (20 ft), uses assistive device, slower speed, mild gait deviations, deviates 15.24 -25.4 cm (6 -10 in) outside 30.48-cm (12-in) walkway width. Ambulates 6 m (20 ft) in less than 9 seconds but greater than 7 seconds 7.72 seconds  9.   AMBULATING BACKWARDS Instructions: Walk backwards until I tell you to stop (2) Mild impairment - Walks 6 m (20 ft), uses assistive device, slower speed, mild gait deviations, deviates 15.24 -25.4 cm (6 -10 in) outside 30.48-cm (12-in) walkway width12.06 seconds  10. STEPS Instructions: Walk up these stairs as you would at home (ie,  using the rail if necessary). At the top turn around and walk down. (1) Moderate impairment-Two feet to a stair; must use rail.  Total 18/30   Interpretation of scores: Non-Specific Older Adults Cutoff Score: <=22/30 = risk of falls Parkinson's Disease Cutoff score <15/30= fall risk (Hoehn & Yahr 1-4)  Minimally Clinically Important Difference (MCID)  Stroke (acute, subacute, and chronic) = MDC: 4.2 points Vestibular (acute) = MDC: 6 points Community Dwelling Older Adults =  MCID: 4 points Parkinson's Disease  =  MDC: 4.3 points  (Academy of Neurologic Physical Therapy (nd). Functional Gait Assessment. Retrieved from https://www.neuropt.org/docs/default-source/cpgs/core-outcome-measures/function-gait-assessment-pocket-guide-proof9-(2).pdf?sfvrsn=b41f35043_0.)   PATIENT SURVEYS:  Stroke Impact Scale : TBA  TREATMENT DATE: 01/11/2024 Self Care: BP and HR taken in sitting at rest beginning of session (see below for details). Vitals:   01/11/24 0947  BP: (!) 145/96  Pulse: (!) 109    NMR 5 Blaze pods on random 1 color taps setting for improved single leg balance/stability, visual scanning, and reaction time.  Performed on 1 minute intervals with 1 minute rest periods (except 5 minute sitting rest before last trial with L LE).  Pt requires CGA guarding. Round 1:  setup (3 pods on 6 inch step and 2 pods on 12 inch step).  35 hits with RLE. Round 2:  setup (3 pods on 6 inch step and 2 pods on 12 inch step).  36 hits with L LE. Round 3:  setup (3 pods on 6 inch step and 2 pods on 12 inch step).  42 hits with R LE. Round 4:  setup (3 pods on 6 inch step and 2 pods on 12 inch step).  40 hits with L LE Notable errors/deficits:  more difficulty noted with L LE; pt appearing to increase speed on round 3 requiring extended sitting rest break (d/t generalized fatigue) before  round 4 Quadruped on mat table: 2x10 reps B LE hip aBduction/ER; decreased AROM noted L LE especially with fatigue; requiring vc's and tactile cueing for hip/core stabilization (pt tending to shift weight to R and rotate trunk L when performing L hip aBduction/ER); focus on WB'ing through L UE and core/hip stabilization with activity.  HR 116 bpm post activity.  PATIENT EDUCATION: Education details: Continue HEP.  SLP referral attempt status (sent fax to Jerilyn Hummer FNP 01/09/24 for SLP referral--no SLP referral noted yet). Person educated: Patient Education method: Explanation, Demonstration, and Verbal cues Education comprehension: verbalized understanding  HOME EXERCISE PROGRAM: 12/23/23 Access Code: [email or web address] URL: [email or web address] Date: 12/23/2023 Prepared by: Damien Caulk  Exercises - Standing March with Counter Support  - 1 x daily - 5 x weekly - 2 sets - 10 reps - Standing Hip Abduction with Counter Support  - 1 x daily - 5 x weekly - 2 sets - 10 reps - Standing Single Leg Stance with Counter Support  - 1 x daily - 5 x weekly - 2 sets - 1 reps - 30 second hold  GOALS: Goals reviewed with patient? Yes  SHORT TERM GOALS: Target date: 01/20/2024  Patient will demo at least 50% compliance with HEP to self manage symptoms.  Baseline: Goal status: INITIAL  2.  Assess FGA Baseline: 18/30 (12/26/23) Goal status: MET  3.  Assess TUG (Cognitive) Baseline: 26.25 seconds Goal status: MET   LONG TERM GOALS: Target date: 02/17/2024   Pt will decrease 5 Time Sit to Stand by at least 3 seconds in order to demonstrate clinically significant improvement in LE strength.  Baseline: 20.81 seconds (Eval) Goal status: INITIAL  2.  Pt will decrease TUG (Cognitive) by at least (or equal to) 3 seconds in order to demonstrate decreased fall risk. Baseline: 26.25 seconds  Goal status: INITIAL  3.  Patient will demo at least 5 points of improvement on FGA to  improve functional balance and reduce fall risk. Baseline: 18/30 (12/26/23) Goal status: INITIAL  4.  Pt will be independent with final HEP in order to improve strength and balance, to decrease fall risk, and improve function at home for ADL's and at work. Baseline:  Goal status: INITIAL   ASSESSMENT:  CLINICAL IMPRESSION: Patient was seen today for physical therapy treatment to address balance and  hip/core stabilization.  Focused session on single leg balance activities with dual tasking (including visual scanning and reaction time) and also hip and core stabilization in quadruped.  Patient continues to be limited by L sided weakness.  Pt requiring cueing for pacing during session (pt appearing motivated to challenge himself during session/on his birthday but then needing rest breaks).  They demonstrate improvement in dual tasking with repetition.  They would continue to benefit from skilled PT to address impairments as noted and progress towards long term goals.  OBJECTIVE IMPAIRMENTS: Abnormal gait, cardiopulmonary status limiting activity, decreased activity tolerance, decreased balance, decreased cognition, decreased coordination, decreased endurance, decreased knowledge of condition, decreased knowledge of use of DME, decreased mobility, difficulty walking, decreased strength, decreased safety awareness, increased fascial restrictions, impaired flexibility, impaired sensation, impaired tone, impaired UE functional use, impaired vision/preception, improper body mechanics, and pain.   ACTIVITY LIMITATIONS: carrying, lifting, bending, standing, squatting, sleeping, stairs, transfers, locomotion level, and caring for others  PARTICIPATION LIMITATIONS: meal prep, cleaning, laundry, medication management, driving, shopping, community activity, occupation, and yard work  PERSONAL FACTORS: Age, Time since onset of injury/illness/exacerbation, and 1-2 comorbidities: CHF, hx of CVA, ESRD are also  affecting patient's functional outcome.   REHAB POTENTIAL: Good  CLINICAL DECISION MAKING: Evolving/moderate complexity  EVALUATION COMPLEXITY: Moderate  PLAN:  PT FREQUENCY: 2x/week  PT DURATION: 8 weeks  PLANNED INTERVENTIONS: [id number]- PT Re-evaluation, 97750- Physical Performance Testing, 97110-Therapeutic exercises, 97530- Therapeutic activity, 97112- Neuromuscular re-education, 97535- Self Care, [id number]- Manual therapy, [phone]- Gait training, [phone]- Orthotic/Prosthetic subsequent, [phone]- Aquatic Therapy, [phone]- Electrical stimulation (unattended), Patient/Family education, Balance training, Stair training, Taping, Joint mobilization, Spinal mobilization, Cognitive remediation, DME instructions, Wheelchair mobility training, Cryotherapy, and Moist heat  PLAN FOR NEXT SESSION: SLP referral?; higher level balance with cognitive tasks/dual tasks; BlazePods; challenge awareness of L side, L hip abd strengthening, quadruped, half kneel, turning   [employer], PT 01/11/2024, 7:24 PM

## 2024-01-11 NOTE — Therapy (Addendum)
 OUTPATIENT OCCUPATIONAL THERAPY NEURO TREATMENT  Patient Name: Nathan Campbell MRN: [id number] DOB:07/27/1975, 48 y.o., male Today's Date: 01/11/2024  PCP: None REFERRING PROVIDER: Sebastian Jerilyn HERO, FNP  END OF SESSION:  OT End of Session - 01/11/24 1017     Visit Number 4    Number of Visits 16    Date for Recertification  02/22/24    Authorization Type [employer] [facility]    Authorization Time Period 12/30/23-02/24/24    Authorization - Number of Visits 17    Progress Note Due on Visit 10    OT Start Time 1017    OT Stop Time 1105    OT Time Calculation (min) 48 min    Equipment Utilized During Treatment E-stim, dowel    Activity Tolerance Patient tolerated treatment well    Behavior During Therapy WFL for tasks assessed/performed         Past Medical History:  Diagnosis Date   Acute combined systolic and diastolic CHF, NYHA class 4 (HCC) 06/10/2016   Anemia    Cardiomyopathy (HCC) 06/14/2016   CHF (congestive heart failure) (HCC)    COVID 10/2020   Dyspnea    ESRD on hemodialysis (HCC)    TTS at [facility]   Hypertension    Hypertensive heart and kidney disease with heart failure (HCC) 06/14/2016   Hypertensive heart disease with congestive heart failure (HCC)    Obesity    Past Surgical History:  Procedure Laterality Date   A/V FISTULAGRAM Left 05/03/2022   Procedure: A/V Fistulagram;  Surgeon: Eliza Lonni RAMAN, MD;  Location: [facility] INVASIVE CV LAB;  Service: Cardiovascular;  Laterality: Left;   AV FISTULA PLACEMENT Left 11/11/2020   Procedure: LEFT ARM First Stage Basilic Vein Fistula Creation;  Surgeon: Sheree Penne Lonni, MD;  Location: [facility] OR;  Service: Vascular;  Laterality: Left;   BASCILIC VEIN TRANSPOSITION Left 01/23/2021   Procedure: Second Stage Basilic Vein Transposition of Left Arm Arteriovenous  Fistula;  Surgeon: Sheree Penne Lonni, MD;  Location: [facility] OR;  Service: Vascular;  Laterality: Left;  Block  to LMA    COLONOSCOPY     FISTULA  SUPERFICIALIZATION Left 05/07/2022   Procedure: PLICATION OF ANEURYSM, LEFT ARM FISTULA;  Surgeon: Eliza Lonni RAMAN, MD;  Location: [facility] OR;  Service: Vascular;  Laterality: Left;   IR CT HEAD LTD  10/07/2023   IR CT HEAD LTD  10/07/2023   IR PATIENT EVAL TECH 0-60 MINS  10/07/2023   IR PERCUTANEOUS ART THROMBECTOMY/INFUSION INTRACRANIAL INC DIAG ANGIO  10/07/2023   NO PAST SURGERIES     PERIPHERAL VASCULAR BALLOON ANGIOPLASTY Left 05/03/2022   Procedure: PERIPHERAL VASCULAR BALLOON ANGIOPLASTY;  Surgeon: Eliza Lonni RAMAN, MD;  Location: [facility] INVASIVE CV LAB;  Service: Cardiovascular;  Laterality: Left;  AVF   RADIOLOGY WITH ANESTHESIA N/A 10/07/2023   Procedure: RADIOLOGY WITH ANESTHESIA;  Surgeon: Radiologist, Medication, MD;  Location: [facility] OR;  Service: Radiology;  Laterality: N/A;   Patient Active Problem List   Diagnosis Date Noted   Stroke (HCC) 11/04/2023   Cognitive and neurobehavioral dysfunction 10/24/2023   Acute hypoxic respiratory failure (HCC) 10/17/2023   Essential hypertension 10/17/2023   Dysphagia 10/17/2023   Leukocytosis 10/17/2023   Obesity 10/17/2023   Acute ischemic stroke (HCC) 10/07/2023   Middle cerebral artery embolism, right 10/07/2023   ESRD (end stage renal disease) (HCC) 03/26/2022   Hyperlipidemia, unspecified 03/20/2021   Acute gout due to renal impairment involving left wrist 04/24/2019   Malignant hypertension 10/14/2016   Combined congestive systolic  and diastolic heart failure (HCC) 06/14/2016   NICM (nonischemic cardiomyopathy) (HCC) 06/14/2016   Renal failure    Hypertensive urgency 06/10/2016   CKD (chronic kidney disease), stage IV (HCC) 06/10/2016   Normocytic anemia 06/10/2016     ONSET DATE: 12/19/2023 referral date Admit date: 10/18/2023 Discharge date: 11/04/2023  REFERRING DIAG: G81.90 (ICD-10-CM) - Hemiplegia, unspecified affecting unspecified side  THERAPY DIAG:  Muscle weakness (generalized)  Other symptoms and signs involving  the nervous system  Other lack of coordination  Cerebrovascular accident (CVA) due to embolism of right middle cerebral artery (HCC)  Rationale for Evaluation and Treatment: Rehabilitation  SUBJECTIVE:   SUBJECTIVE STATEMENT: Pt reports things have been going well. He is interested in trying E-stim today.   Pt accompanied by: self  PERTINENT HISTORY: Pt is a 48 yo male presenting to this clinic with hemiplegia s/p R MCA embolism. Pt was hospitalized for this from 10/07/23-10/18/23, then discharged to neurology and received IP PT/OT/SLP 10/18/23-11/04/23. Initial CT head revealed right MCA territory ischemic changes with calcific density in proximal right M1 concerning for calcific embolus.   CHF, CM, ESRD on HD TTS, HTN, obesity, HLD  PRECAUTIONS: Fall, L sided weakness, blood thinners  WEIGHT BEARING RESTRICTIONS: No  PAIN:  Are you having pain? No  FALLS: Has patient fallen in last 6 months? No  LIVING ENVIRONMENT: Lives with: lives with their family sister and her 2 sons. Pt reports will be getting own place soon. Lives in: House/apartment townhouse, 2 level Stairs: Yes: Internal: 3 steps; on right going up Has following equipment at home: None  PLOF: Independent and Vocation/Vocational requirements: Worked in distribution in [employer]   PATIENT GOALS: Get back movement in my left arm, return to work, and be able to play video games like I was before.  OBJECTIVE:  Note: Objective measures were completed at Evaluation unless otherwise noted.  HAND DOMINANCE: Right  ADLs: Overall ADLs: Independent Transfers/ambulation related to ADLs: Independent Eating: Independent Grooming: Independent UB Dressing: Independent LB Dressing: Independent Toileting: Independent Bathing: Independent Tub Shower transfers: Independent, shower/tub combo Equipment: none  IADLs: Shopping: Independent Light housekeeping: Mom is doing pharmacologist.  Meal Prep: Needs some help with opening  cans and jars d/t weakness in L hand Community mobility: Mother driving pt to appts  Medication management: Mother helping with medication mgmt Financial management: Independent Handwriting: did not assess d/t pt report of no changes and pt dominant hand unaffected.   MOBILITY STATUS: Independent  POSTURE COMMENTS:  No Significant postural limitations Sitting balance: WFL  ACTIVITY TOLERANCE: Activity tolerance: No changes  FUNCTIONAL OUTCOME MEASURES: Quick Dash: 47.7/100: 47.7% impairment  UPPER EXTREMITY ROM:  R WNL  Active ROM Right eval Left eval  Shoulder flexion  92  Shoulder abduction  70  Shoulder adduction    Shoulder extension    Shoulder internal rotation    Shoulder external rotation    Elbow flexion    Elbow extension    Wrist flexion  10  Wrist extension  40  Wrist ulnar deviation    Wrist radial deviation    Wrist pronation    Wrist supination  Impaired (re-assess)  (Blank rows = not tested)  UPPER EXTREMITY MMT:   WFL RUE, 3-/5 grossly for LUE  MMT Right eval Left eval  Shoulder flexion    Shoulder abduction    Shoulder adduction    Shoulder extension    Shoulder internal rotation    Shoulder external rotation    Middle trapezius  Lower trapezius    Elbow flexion    Elbow extension    Wrist flexion    Wrist extension    Wrist ulnar deviation    Wrist radial deviation    Wrist pronation    Wrist supination    (Blank rows = not tested)  HAND FUNCTION: Grip strength: Right: 61.2 lbs; Left: 15.8 lbs  COORDINATION: 9 Hole Peg test: Right: 41.60 sec; Left: unable to pick up a peg in 2 minutes sec Box and Blocks:  Right 36 blocks, Left 11blocks  SENSATION: WFL  EDEMA: none  MUSCLE TONE: LUE: Rigidity  COGNITION: Overall cognitive status: Within functional limits for tasks assessed  VISION: Subjective report: no concerns Baseline vision: No visual deficits Visual history: no hx  VISION ASSESSMENT: WFL To be further  assessed in functional context  Patient has difficulty with following activities due to following visual impairments: none  PERCEPTION: WFL  PRAXIS: Impaired: Ideomotor  OBSERVATIONS: Weakness in L hand and limited ROM in LUE affecting ability to complete ADL/IADL.                                                                                                                              TREATMENT - Neuro re-education completed for duration as noted below including:  OT educated patient on use of NMES unit as well as e-stim to encourage improved nerve function as needed to produce more active LUE ROM and increase functional use through muscular re-education. Therapist educated patient on application for L shoulder flexion and applied electrodes accordingly. Therapist utilized e-stim Russian waveform with 10/10 cycle time, 50% duty cycle, burst frequency of 50 bps, and ramp of 2 seconds. Patient tolerating up to 40 intensity with supervised application to promote improved AROM and pain management. Therapist cued patient to complete shoulder flexion activities during the NMES "on" cycle and shoulder extension during the "off" cycle to replicate functional reach and placement of the affected LUE. Functional activities incorporated included reaching for his phone and reaching for a dowel in different angles to promote carryover to daily task.   - Therapeutic exercises completed for duration as noted below including:  Pt also reported pain in his L shoulder, which could possibly be due to stiffness and decreased functional use. Therefore, OT initiated shoulder shrug exercises to improve shoulder mobility and reduce discomfort. The exercises included the following: Seated Shoulder Shrug Circles AROM Backward   - Seated Shoulder Shrugs   - Seated Shoulder Shrug Circles AROM Forward   Pt was able to return demonstration with minimal cueing for proper execution of exercises.   PATIENT  EDUCATION: Education details: E-stim, shoulder shrug exercises  Person educated: Patient Education method: Explanation, Demonstration, Tactile cues, Verbal cues, and Handouts Education comprehension: verbalized understanding, returned demonstration, verbal cues required, and needs further education  HOME EXERCISE PROGRAM: 12/30/23: Shoulder, Forearm/wrist, and hand HEP  01/02/24: Theraputty HEP with red putty (ACCESS CODE [id number]) 01/11/24: Shoulder shrug exercises (same access  code as above)  GOALS: Goals reviewed with patient? Yes  SHORT TERM GOALS: Target date: 01/23/24  Pt will be independent with LUE ROM, coordination, and grip strengthening HEP Baseline: New to OP OT Goal status: IN PROGRESS  2.  Pt will increase L shoulder abduction to at least 90 degrees for improved functional use  Baseline: 70 degrees  Goal status:IN PROGRESS  3.  Pt will be independent with sleeping strategies for hemiparesis and hemiplegia of LUE Baseline: New to OP OT Goal status: IN PROGRESS  4.  Pt will be independent with resting hand splint wear and care for night use and to improve functional use in L hand Baseline: New to OP OT Goal status: IN PROGRESS   LONG TERM GOALS: Target date: 02/22/24  Pt will decrease QuickDASH score to no more than 25/100 to demonstrate improved function in daily tasks Baseline: 47.5/100 Goal status: IN PROGRESS  2.  Pt will increase L shoulder flexion ROM to at least 120 degrees for improved functional use Baseline: 92 degrees Goal status:IN PROGRESS  3.  Pt will increase L shoulder abduction ROM to at least 100 degrees for improved functional use Baseline: 70 degrees Goal status: IN PROGRESS  4.  Pt will increase L wrist flexion and extension by at least 40 degrees and 20 degrees, respectively. Baseline: 10 degrees wrist flexion, 40 degrees wrist extension Goal status: IN PROGRESS  ASSESSMENT:  CLINICAL IMPRESSION: Patient presents with L UE pain  and weakness secondary to CVA. He demonstrates good rehab potential, as evidenced by verbalizing and demonstrating understanding of his HEP, as well as expressing motivation for actively trying E-stim. He will continue to benefit from skilled outpatient OT to improve functional use of his L UE for ADLs and IADLs.   PERFORMANCE DEFICITS: in functional skills including ADLs, IADLs, coordination, dexterity, tone, ROM, strength, pain, Fine motor control, body mechanics, cardiopulmonary status limiting function, and UE functional use, cognitive skills including attention, safety awareness, and sequencing, and psychosocial skills including coping strategies, interpersonal interactions, and routines and behaviors.   IMPAIRMENTS: are limiting patient from IADLs, rest and sleep, work, and leisure.   CO-MORBIDITIES: has co-morbidities such as ESRD that affects occupational performance. Patient will benefit from skilled OT to address above impairments and improve overall function.  REHAB POTENTIAL: Good   PLAN:  OT FREQUENCY: 2x/week  OT DURATION: 8 weeks  PLANNED INTERVENTIONS: 97168 OT Re-evaluation, 97535 self care/ADL training, 02889 therapeutic exercise, 97530 therapeutic activity, 97112 neuromuscular re-education, 97140 manual therapy, 97032 electrical stimulation (manual), 97760 Orthotic Initial, [id number] Orthotic/Prosthetic subsequent, passive range of motion, functional mobility training, visual/perceptual remediation/compensation, energy conservation, patient/family education, and DME and/or AE instructions  RECOMMENDED OTHER SERVICES: none noted  CONSULTED AND AGREED WITH PLAN OF CARE: Patient  PLAN FOR NEXT SESSION:  Continue with E-stim- L shoulder Coordination/ strengthening activities ROM activities   Jaemarie Hochberg, Student-OT 01/11/2024, 12:12 PM

## 2024-01-11 NOTE — Patient Instructions (Addendum)
 Best Electrode Placement for Arm and Hand Stroke Rehabilitation Electrical stimulation, also referred to as e-stim, NMES, or FES, can be an effective tool in reducing the symptoms of stroke, such as increasing strength and function. The success of one's recovery using electrical stimulation will rely heavily on proper electrode placement. SHOULDER Flexion (black in front, red in back) - arm comes forward    Access Code: [email or web address] URL: [email or web address] Date: 01/11/2024 Prepared by: [email or web address] Farheen Pfahler Exercises Seated Shoulder Shrug Circles AROM Backward  - 2 x daily - 10 reps - Seated Shoulder Shrugs  - 2 x daily - 10 reps - Seated Shoulder Shrug Circles AROM Forward  - 2 x daily - 10 reps

## 2024-01-12 ENCOUNTER — Encounter (HOSPITAL_BASED_OUTPATIENT_CLINIC_OR_DEPARTMENT_OTHER): Payer: Self-pay | Admitting: Internal Medicine

## 2024-01-12 DIAGNOSIS — R5383 Other fatigue: Secondary | ICD-10-CM

## 2024-01-12 DIAGNOSIS — G47429 Narcolepsy in conditions classified elsewhere without cataplexy: Secondary | ICD-10-CM

## 2024-01-13 ENCOUNTER — Encounter: Payer: Self-pay | Admitting: Physical Therapy

## 2024-01-13 ENCOUNTER — Ambulatory Visit

## 2024-01-13 ENCOUNTER — Ambulatory Visit: Admitting: Physical Therapy

## 2024-01-13 VITALS — BP 136/93 | HR 95

## 2024-01-13 DIAGNOSIS — R278 Other lack of coordination: Secondary | ICD-10-CM

## 2024-01-13 DIAGNOSIS — R29898 Other symptoms and signs involving the musculoskeletal system: Secondary | ICD-10-CM

## 2024-01-13 DIAGNOSIS — I69854 Hemiplegia and hemiparesis following other cerebrovascular disease affecting left non-dominant side: Secondary | ICD-10-CM

## 2024-01-13 DIAGNOSIS — M6281 Muscle weakness (generalized): Secondary | ICD-10-CM

## 2024-01-13 DIAGNOSIS — R208 Other disturbances of skin sensation: Secondary | ICD-10-CM

## 2024-01-13 DIAGNOSIS — I63411 Cerebral infarction due to embolism of right middle cerebral artery: Secondary | ICD-10-CM

## 2024-01-13 DIAGNOSIS — R2689 Other abnormalities of gait and mobility: Secondary | ICD-10-CM

## 2024-01-13 DIAGNOSIS — R29818 Other symptoms and signs involving the nervous system: Secondary | ICD-10-CM

## 2024-01-13 NOTE — Therapy (Signed)
 OUTPATIENT PHYSICAL THERAPY NEURO TREATMENT   Patient Name: Nathan Campbell MRN: [id number] DOB:[birth year], 48 y.o., male Today's Date: 01/13/2024   PCP: Pt reports PCP is Blad but did not give any further identifying information. REFERRING PROVIDER: Sebastian Jerilyn HERO, FNP  END OF SESSION:  PT End of Session - 01/13/24 0932     Visit Number 7    Number of Visits 17   16 plus Eval   Date for Recertification  02/24/24   For scheduling delays   Authorization Type [facility] Medicare    Authorization Time Period 12/26/23 - 02/20/24    Authorization - Visit Number 7    Authorization - Number of Visits 17    PT Start Time [phone]   started late; pt needing to use restroom between PT and OT session   PT Stop Time 1015    PT Time Calculation (min) 32 min    Equipment Utilized During Treatment Gait belt    Activity Tolerance Patient tolerated treatment well    Behavior During Therapy [facility] for tasks assessed/performed          Past Medical History:  Diagnosis Date   Acute combined systolic and diastolic CHF, NYHA class 4 (HCC) 06/10/2016   Anemia    Cardiomyopathy (HCC) 06/14/2016   CHF (congestive heart failure) (HCC)    COVID 10/2020   Dyspnea    ESRD on hemodialysis (HCC)    TTS at [facility]   Hypertension    Hypertensive heart and kidney disease with heart failure (HCC) 06/14/2016   Hypertensive heart disease with congestive heart failure (HCC)    Obesity    Past Surgical History:  Procedure Laterality Date   A/V FISTULAGRAM Left 05/03/2022   Procedure: A/V Fistulagram;  Surgeon: Eliza Lonni RAMAN, MD;  Location: [facility] INVASIVE CV LAB;  Service: Cardiovascular;  Laterality: Left;   AV FISTULA PLACEMENT Left 11/11/2020   Procedure: LEFT ARM First Stage Basilic Vein Fistula Creation;  Surgeon: Sheree Penne Lonni, MD;  Location: [facility] OR;  Service: Vascular;  Laterality: Left;   BASCILIC VEIN TRANSPOSITION Left 01/23/2021   Procedure: Second Stage Basilic Vein  Transposition of Left Arm Arteriovenous  Fistula;  Surgeon: Sheree Penne Lonni, MD;  Location: [facility] OR;  Service: Vascular;  Laterality: Left;  Block  to LMA    COLONOSCOPY     FISTULA SUPERFICIALIZATION Left 05/07/2022   Procedure: PLICATION OF ANEURYSM, LEFT ARM FISTULA;  Surgeon: Eliza Lonni RAMAN, MD;  Location: [facility] OR;  Service: Vascular;  Laterality: Left;   IR CT HEAD LTD  10/07/2023   IR CT HEAD LTD  10/07/2023   IR PATIENT EVAL TECH 0-60 MINS  10/07/2023   IR PERCUTANEOUS ART THROMBECTOMY/INFUSION INTRACRANIAL INC DIAG ANGIO  10/07/2023   NO PAST SURGERIES     PERIPHERAL VASCULAR BALLOON ANGIOPLASTY Left 05/03/2022   Procedure: PERIPHERAL VASCULAR BALLOON ANGIOPLASTY;  Surgeon: Eliza Lonni RAMAN, MD;  Location: [facility] INVASIVE CV LAB;  Service: Cardiovascular;  Laterality: Left;  AVF   RADIOLOGY WITH ANESTHESIA N/A 10/07/2023   Procedure: RADIOLOGY WITH ANESTHESIA;  Surgeon: Radiologist, Medication, MD;  Location: [facility] OR;  Service: Radiology;  Laterality: N/A;   Patient Active Problem List   Diagnosis Date Noted   Stroke (HCC) 11/04/2023   Cognitive and neurobehavioral dysfunction 10/24/2023   Acute hypoxic respiratory failure (HCC) 10/17/2023   Essential hypertension 10/17/2023   Dysphagia 10/17/2023   Leukocytosis 10/17/2023   Obesity 10/17/2023   Acute ischemic stroke (HCC) 10/07/2023   Middle cerebral artery embolism, right  10/07/2023   ESRD (end stage renal disease) (HCC) 03/26/2022   Hyperlipidemia, unspecified 03/20/2021   Acute gout due to renal impairment involving left wrist 04/24/2019   Malignant hypertension 10/14/2016   Combined congestive systolic and diastolic heart failure (HCC) 06/14/2016   NICM (nonischemic cardiomyopathy) (HCC) 06/14/2016   Renal failure    Hypertensive urgency 06/10/2016   CKD (chronic kidney disease), stage IV (HCC) 06/10/2016   Normocytic anemia 06/10/2016    ONSET DATE: 12/14/2023- date of referral  REFERRING DIAG: G81.90  (ICD-10-CM) - Hemiparesis (HCC)  THERAPY DIAG:  Other lack of coordination  Muscle weakness (generalized)  Other abnormalities of gait and mobility  Cerebrovascular accident (CVA) due to embolism of right middle cerebral artery (HCC)  Other symptoms and signs involving the nervous system  Other symptoms and signs involving the musculoskeletal system  Rationale for Evaluation and Treatment: Rehabilitation  SUBJECTIVE:  Likes to be called LC.                                                                                                                                                                                            SUBJECTIVE STATEMENT: Pt reports no recent falls.  Had a nice birthday.  Pt reports able to put on his shower cap on own this morning (instead of his mom helping).  Pt reports his BP dropped yesterday in dialysis (thinks it is because they pulled too much fluid) but is better now.  Hasn't checked it today yet.  Feels like his appetite is improving gradually. Pt accompanied by: self.  Mom drove pt.  PERTINENT HISTORY: Hospitalization 10/07/23 for acute onset of left-sided weakness, left side neglect, dysarthria and right gaze preference; imaging showing acute right MCA CVA with right M1 occlusion (intubated for airway protection; s/p TNK; s/p revascularization of R MCA with IR 7/25).  IP rehab stay 10/18/23-11/04/23. Urgent care visit 12/02/23 for gout (L foot).  PMH includes: gout, CKD, ESRD on HD T/R/Sat, CHF, anemia, htn, stroke 10/07/23, acute hypoxic respiratory failure, dysphagia, cardiomyopathy, and obesity (BMI 41).  PAIN:  Are you having pain? Yes: NPRS scale: 0/10 currently Pain location: L shoulder Pain description: Aching Aggravating factors: Laying down Relieving factors: Muscle relaxer  PRECAUTIONS: Fall; fistula in LUE (dialysis T/Th/Sat)  RED FLAGS: None   WEIGHT BEARING RESTRICTIONS: No  FALLS: Has patient fallen in last 6 months? No  LIVING  ENVIRONMENT: Lives with: Sister and her 2 sons; mom currently staying with pt to assist as needed Lives in: Other Townhouse--stays on 2nd floor Stairs: Yes: Internal: 5 steps; on right going up and External: 0 steps; none Has following equipment at  home: Crutches  PLOF: Independent  PATIENT GOALS: To get more function in arm; get voice back; get strength up.  OBJECTIVE:  Note: Objective measures were completed at Evaluation unless otherwise noted.  COGNITION: Overall cognitive status: A&Ox4; possible decreased awareness of deficits  COORDINATION:  Rapid Alternating toe taps (in sitting): Decreased L LE  Heel to shin (in sitting); mild decreased L LE  LOWER EXTREMITY ROM:     Active  Right Eval Left Eval  Hip flexion WNL WNL  Hip extension    Hip abduction    Hip adduction    Hip internal rotation    Hip external rotation    Knee flexion WNL WNL  Knee extension WNL WNL  Ankle dorsiflexion WNL WNL  Ankle plantarflexion WNL WNL  Ankle inversion    Ankle eversion     (Blank rows = not tested)  LOWER EXTREMITY MMT:    MMT Right Eval Left Eval  Hip flexion 5/5 4+/5  Hip extension    Hip abduction    Hip adduction    Hip internal rotation    Hip external rotation    Knee flexion 5/5 4+/5  Knee extension 5/5 5/5  Ankle dorsiflexion 5/5 4+/5  Ankle plantarflexion At least 3/5 AROM At least 3/5 AROM  Ankle inversion    Ankle eversion    (Blank rows = not tested) GAIT: Findings: Gait Characteristics: step through pattern, decreased arm swing- Left, decreased ankle dorsiflexion- Left, and wide BOS, Distance walked: clinic distances, Assistive device utilized:None, Level of assistance: SBA, and Comments: pt tending to walk close to items on L side but did not bump into anything  FUNCTIONAL TESTS:  5 times sit to stand: 20.81 seconds 10 meter walk test: 1.25 m/sec (7.97 seconds); no AD use Functional gait assessment: 18/30 (12/26/23) SLS: R LE 1st 2 trials about 1  second each but on 3rd trial 9.0 seconds; L LE 1st 2 trials about 1 second each but on 3rd trial 30 seconds L LE (time stopped by therapist at 30 seconds) TUG (Manual): 9.41 seconds 12/26/23 TUG (Cognitive): 26.25 seconds 12/26/23  FUNCTIONAL GAIT ASSESSMENT  Date: 12/26/23 Score  GAIT LEVEL SURFACE Instructions: Walk at your normal speed from here to the next mark (6 m) [20 ft]. (1) Moderate impairment-Walks 6 m (20 ft), slow speed, abnormal gait pattern, evidence for imbalance, or deviates 25.4 - 38.1 cm (10 -15 in) outside of the 30.48-cm (12-in) walkway width. Requires more than 7 seconds to ambulate 6 m (20 ft). 8.53 seconds  2.   CHANGE IN GAIT SPEED Instructions: Begin walking at your normal pace (for 1.5 m [5 ft]). When I tell you "go," walk as fast as you can (for 1.5 m [5 ft]). When I tell you "slow," walk as slowly as you can (for 1.5 m [5 ft]. (2) Mild impairment - Is able to change speed but demonstrates mild gait deviations, deviates 15.24 -25.4 cm (6 -10 in) outside of the 30.48-cm (12-in) walkway width, or no gait deviations but unable to achieve a significant change in velocity, or uses an assistive device  3.    GAIT WITH HORIZONTAL HEAD TURNS Instructions: Walk from here to the next mark 6 m (20 ft) away. Begin walking at your normal pace. Keep walking straight; after 3 steps, turn your head to the right and keep walking straight while looking to the right. After 3 more steps, turn your head to the left and keep walking straight while looking left. Continue alternating looking  right and left. (2) Mild impairment - Performs head turns smoothly with slight change in gait velocity (eg, minor disruption to smooth gait path), deviates 15.24 -25.4 cm (6 -10 in) outside 30.48-cm (12-in) walkway width, or uses an assistive device.  4.   GAIT WITH VERTICAL HEAD TURNS Instructions: Walk from here to the next mark (6 m [20 ft]). Begin walking at your normal pace. Keep walking straight; after 3  steps, tip your head up and keep walking straight while looking up. After 3 more steps, tip your head down, keep walking straight while looking down. Continue  alternating looking up and down every 3 steps until you have completed 2 repetitions in each direction. (2) Mild impairment - Performs task with slight change in gait velocity (eg, minor disruption to smooth gait path), deviates 15.24 -25.4 cm (6 -10 in) outside 30.48-cm (12-in) walkway width or uses assistive device.  5.  GAIT AND PIVOT TURN Instructions: Begin with walking at your normal pace. When I tell you, "turn and stop," turn as quickly as you can to face the opposite direction and stop. (2) Mild impairment - Pivot turns safely in 3 seconds and stops with no loss of balance, or pivot turns safely within 3 seconds and stops with mild imbalance, requires small steps to catch balance  6.   STEP OVER OBSTACLE Instructions: Begin walking at your normal speed. When you come to the shoe box, step over it, not around it, and keep walking. (2) Mild impairment - Is able to step over one shoe box (11.43 cm [4.5 in] total height) without changing gait speed; no evidence of imbalance.  7.   GAIT WITH NARROW BASE OF SUPPORT Instructions: Walk on the floor with arms folded across the chest, feet aligned heel to toe in tandem for a distance of 3.6 m [12 ft]. The number of steps taken in a straight line are counted for a maximum of 10 steps. (2) Mild impairment - Ambulates 7-9 steps  8.   GAIT WITH EYES CLOSED Instructions: Walk at your normal speed from here to the next mark (6 m [20 ft]) with your eyes closed. (2) Mild impairment - Walks 6 m (20 ft), uses assistive device, slower speed, mild gait deviations, deviates 15.24 -25.4 cm (6 -10 in) outside 30.48-cm (12-in) walkway width. Ambulates 6 m (20 ft) in less than 9 seconds but greater than 7 seconds 7.72 seconds  9.   AMBULATING BACKWARDS Instructions: Walk backwards until I tell you to stop (2) Mild  impairment - Walks 6 m (20 ft), uses assistive device, slower speed, mild gait deviations, deviates 15.24 -25.4 cm (6 -10 in) outside 30.48-cm (12-in) walkway width12.06 seconds  10. STEPS Instructions: Walk up these stairs as you would at home (ie, using the rail if necessary). At the top turn around and walk down. (1) Moderate impairment-Two feet to a stair; must use rail.  Total 18/30   Interpretation of scores: Non-Specific Older Adults Cutoff Score: <=22/30 = risk of falls Parkinson's Disease Cutoff score <15/30= fall risk (Hoehn & Yahr 1-4)  Minimally Clinically Important Difference (MCID)  Stroke (acute, subacute, and chronic) = MDC: 4.2 points Vestibular (acute) = MDC: 6 points Community Dwelling Older Adults =  MCID: 4 points Parkinson's Disease  =  MDC: 4.3 points  (Academy of Neurologic Physical Therapy (nd). Functional Gait Assessment. Retrieved from https://www.neuropt.org/docs/default-source/cpgs/core-outcome-measures/function-gait-assessment-pocket-guide-proof9-(2).pdf?sfvrsn=b60f35043_0.)   PATIENT SURVEYS:  Stroke Impact Scale : TBA  TREATMENT DATE: 01/13/2024  Self Care: BP and HR taken in sitting at rest beginning of session (see below for details). Vitals:   01/13/24 0947  BP: (!) 136/93  Pulse: 95    Therapeutic Exercise: Quadruped: 10 reps x2 sets of B hip extension (knees flexed); vc's for core stabilization; decreased ROM (more difficulty) with L LE Supine bridging: 10 reps x2 sets with B LE's (vc's for equal glute activation); 10 reps x2 sets with R LE (assist to keep L LE off mat table); 10 reps x2 sets with L LE (decreased AROM/hip clearance noted; anticipate d/t weakness) Quadruped: hip aBduction/ER with knee bent; x10 reps B LE's; decreased ROM noted L LE  Sidelying clamshell: x10 reps B LE's; initial vc's for technique  PATIENT  EDUCATION: Education details: HEP updated.  Education on pacing with exercises. Person educated: Patient Education method: Explanation, Demonstration, and Verbal cues Education comprehension: verbalized understanding  HOME EXERCISE PROGRAM: 01/13/24 Access Code: [email or web address] URL: [email or web address] Date: 01/13/2024 Prepared by: Damien Caulk  Exercises - Standing March with Counter Support  - 1 x daily - 5 x weekly - 2 sets - 10 reps - Standing Hip Abduction with Counter Support  - 1 x daily - 5 x weekly - 2 sets - 10 reps - Standing Single Leg Stance with Counter Support  - 1 x daily - 5 x weekly - 2 sets - 1 reps - 30 second hold - Supine Bridge  - 1 x daily - 5 x weekly - 2 sets - 10 reps - Clamshell  - 1 x daily - 5 x weekly - 2 sets - 10 reps Access Code: [email or web address] URL: [email or web address] Date: 12/23/2023 Prepared by: Damien Caulk  Exercises - Standing March with Counter Support  - 1 x daily - 5 x weekly - 2 sets - 10 reps - Standing Hip Abduction with Counter Support  - 1 x daily - 5 x weekly - 2 sets - 10 reps - Standing Single Leg Stance with Counter Support  - 1 x daily - 5 x weekly - 2 sets - 1 reps - 30 second hold  GOALS: Goals reviewed with patient? Yes  SHORT TERM GOALS: Target date: 01/20/2024  Patient will demo at least 50% compliance with HEP to self manage symptoms.  Baseline: Goal status: INITIAL  2.  Assess FGA Baseline: 18/30 (12/26/23) Goal status: MET  3.  Assess TUG (Cognitive) Baseline: 26.25 seconds Goal status: MET   LONG TERM GOALS: Target date: 02/17/2024   Pt will decrease 5 Time Sit to Stand by at least 3 seconds in order to demonstrate clinically significant improvement in LE strength.  Baseline: 20.81 seconds (Eval) Goal status: INITIAL  2.  Pt will decrease TUG (Cognitive) by at least (or equal to) 3 seconds in order to demonstrate decreased fall risk. Baseline: 26.25 seconds  Goal status:  INITIAL  3.  Patient will demo at least 5 points of improvement on FGA to improve functional balance and reduce fall risk. Baseline: 18/30 (12/26/23) Goal status: INITIAL  4.  Pt will be independent with final HEP in order to improve strength and balance, to decrease fall risk, and improve function at home for ADL's and at work. Baseline:  Goal status: INITIAL   ASSESSMENT:  CLINICAL IMPRESSION: Patient was seen today for physical therapy treatment to address strengthening and advancing HEP.  Focused session on core and hip stability, LE strengthening, and WB'ing through L UE.  L hip/LE weakness noted during exercises and pt requiring  cueing for core stabilization during exercises.  Patient continues to be limited by activity tolerance (requiring pacing/rest breaks between exercises) and L sided weakness during session.  Pt demonstrating appropriate understanding of HEP and appeared motivated to perform exercises.  They would continue to benefit from skilled PT to address impairments as noted and progress towards long term goals.  OBJECTIVE IMPAIRMENTS: Abnormal gait, cardiopulmonary status limiting activity, decreased activity tolerance, decreased balance, decreased cognition, decreased coordination, decreased endurance, decreased knowledge of condition, decreased knowledge of use of DME, decreased mobility, difficulty walking, decreased strength, decreased safety awareness, increased fascial restrictions, impaired flexibility, impaired sensation, impaired tone, impaired UE functional use, impaired vision/preception, improper body mechanics, and pain.   ACTIVITY LIMITATIONS: carrying, lifting, bending, standing, squatting, sleeping, stairs, transfers, locomotion level, and caring for others  PARTICIPATION LIMITATIONS: meal prep, cleaning, laundry, medication management, driving, shopping, community activity, occupation, and yard work  PERSONAL FACTORS: Age, Time since onset of  injury/illness/exacerbation, and 1-2 comorbidities: CHF, hx of CVA, ESRD are also affecting patient's functional outcome.   REHAB POTENTIAL: Good  CLINICAL DECISION MAKING: Evolving/moderate complexity  EVALUATION COMPLEXITY: Moderate  PLAN:  PT FREQUENCY: 2x/week  PT DURATION: 8 weeks  PLANNED INTERVENTIONS: [id number]- PT Re-evaluation, 97750- Physical Performance Testing, 97110-Therapeutic exercises, 97530- Therapeutic activity, 97112- Neuromuscular re-education, 97535- Self Care, [id number]- Manual therapy, [phone]- Gait training, [phone]- Orthotic/Prosthetic subsequent, [phone]- Aquatic Therapy, [phone]- Electrical stimulation (unattended), Patient/Family education, Balance training, Stair training, Taping, Joint mobilization, Spinal mobilization, Cognitive remediation, DME instructions, Wheelchair mobility training, Cryotherapy, and Moist heat  PLAN FOR NEXT SESSION: SLP referral?; higher level balance with cognitive tasks/dual tasks; [employer]; challenge awareness of L side, L hip abd strengthening, quadruped, half kneel, turning   [employer], PT 01/13/2024, 12:59 PM

## 2024-01-13 NOTE — Therapy (Signed)
 OUTPATIENT OCCUPATIONAL THERAPY NEURO TREATMENT  Patient Name: Markes Shatswell MRN: [id number] DOB:1975-11-14, 48 y.o., male Today's Date: 01/13/2024  PCP: None REFERRING PROVIDER: Sebastian Jerilyn HERO, FNP  END OF SESSION:  OT End of Session - 01/13/24 0939     Visit Number 5    Number of Visits 16    Date for Recertification  02/22/24    Authorization Type [employer] [facility]    Authorization Time Period 12/30/23-02/24/24    Authorization - Visit Number 4    Authorization - Number of Visits 17    Progress Note Due on Visit 10    OT Start Time 0851    OT Stop Time 0930    OT Time Calculation (min) 39 min    Equipment Utilized During Treatment E-stim cones, connect 4 game    Activity Tolerance Patient tolerated treatment well    Behavior During Therapy WFL for tasks assessed/performed          Past Medical History:  Diagnosis Date   Acute combined systolic and diastolic CHF, NYHA class 4 (HCC) 06/10/2016   Anemia    Cardiomyopathy (HCC) 06/14/2016   CHF (congestive heart failure) (HCC)    COVID 10/2020   Dyspnea    ESRD on hemodialysis (HCC)    TTS at [facility]   Hypertension    Hypertensive heart and kidney disease with heart failure (HCC) 06/14/2016   Hypertensive heart disease with congestive heart failure (HCC)    Obesity    Past Surgical History:  Procedure Laterality Date   A/V FISTULAGRAM Left 05/03/2022   Procedure: A/V Fistulagram;  Surgeon: Eliza Lonni RAMAN, MD;  Location: [facility] INVASIVE CV LAB;  Service: Cardiovascular;  Laterality: Left;   AV FISTULA PLACEMENT Left 11/11/2020   Procedure: LEFT ARM First Stage Basilic Vein Fistula Creation;  Surgeon: Sheree Penne Lonni, MD;  Location: [facility] OR;  Service: Vascular;  Laterality: Left;   BASCILIC VEIN TRANSPOSITION Left 01/23/2021   Procedure: Second Stage Basilic Vein Transposition of Left Arm Arteriovenous  Fistula;  Surgeon: Sheree Penne Lonni, MD;  Location: [facility] OR;  Service: Vascular;  Laterality:  Left;  Block  to LMA    COLONOSCOPY     FISTULA SUPERFICIALIZATION Left 05/07/2022   Procedure: PLICATION OF ANEURYSM, LEFT ARM FISTULA;  Surgeon: Eliza Lonni RAMAN, MD;  Location: [facility] OR;  Service: Vascular;  Laterality: Left;   IR CT HEAD LTD  10/07/2023   IR CT HEAD LTD  10/07/2023   IR PATIENT EVAL TECH 0-60 MINS  10/07/2023   IR PERCUTANEOUS ART THROMBECTOMY/INFUSION INTRACRANIAL INC DIAG ANGIO  10/07/2023   NO PAST SURGERIES     PERIPHERAL VASCULAR BALLOON ANGIOPLASTY Left 05/03/2022   Procedure: PERIPHERAL VASCULAR BALLOON ANGIOPLASTY;  Surgeon: Eliza Lonni RAMAN, MD;  Location: [facility] INVASIVE CV LAB;  Service: Cardiovascular;  Laterality: Left;  AVF   RADIOLOGY WITH ANESTHESIA N/A 10/07/2023   Procedure: RADIOLOGY WITH ANESTHESIA;  Surgeon: Radiologist, Medication, MD;  Location: [facility] OR;  Service: Radiology;  Laterality: N/A;   Patient Active Problem List   Diagnosis Date Noted   Stroke (HCC) 11/04/2023   Cognitive and neurobehavioral dysfunction 10/24/2023   Acute hypoxic respiratory failure (HCC) 10/17/2023   Essential hypertension 10/17/2023   Dysphagia 10/17/2023   Leukocytosis 10/17/2023   Obesity 10/17/2023   Acute ischemic stroke (HCC) 10/07/2023   Middle cerebral artery embolism, right 10/07/2023   ESRD (end stage renal disease) (HCC) 03/26/2022   Hyperlipidemia, unspecified 03/20/2021   Acute gout due to renal impairment involving left  wrist 04/24/2019   Malignant hypertension 10/14/2016   Combined congestive systolic and diastolic heart failure (HCC) 06/14/2016   NICM (nonischemic cardiomyopathy) (HCC) 06/14/2016   Renal failure    Hypertensive urgency 06/10/2016   CKD (chronic kidney disease), stage IV (HCC) 06/10/2016   Normocytic anemia 06/10/2016     ONSET DATE: 12/19/2023 referral date Admit date: 10/18/2023 Discharge date: 11/04/2023  REFERRING DIAG: G81.90 (ICD-10-CM) - Hemiplegia, unspecified affecting unspecified side  THERAPY DIAG:  Other lack of  coordination  Hemiplegia and hemiparesis following other cerebrovascular disease affecting left non-dominant side (HCC)  Muscle weakness (generalized)  Other symptoms and signs involving the musculoskeletal system  Other disturbances of skin sensation  Rationale for Evaluation and Treatment: Rehabilitation  SUBJECTIVE:   SUBJECTIVE STATEMENT: Pt reports improved movement. States that he was able to take off his shower cap with the left arm stated I wasn't able to do that before.  Pt accompanied by: self  PERTINENT HISTORY: Pt is a 48 yo male presenting to this clinic with hemiplegia s/p R MCA embolism. Pt was hospitalized for this from 10/07/23-10/18/23, then discharged to neurology and received IP PT/OT/SLP 10/18/23-11/04/23. Initial CT head revealed right MCA territory ischemic changes with calcific density in proximal right M1 concerning for calcific embolus.   CHF, CM, ESRD on HD TTS, HTN, obesity, HLD  PRECAUTIONS: Fall, L sided weakness, blood thinners  WEIGHT BEARING RESTRICTIONS: No  PAIN:  Are you having pain? No  FALLS: Has patient fallen in last 6 months? No  LIVING ENVIRONMENT: Lives with: lives with their family sister and her 2 sons. Pt reports will be getting own place soon. Lives in: House/apartment townhouse, 2 level Stairs: Yes: Internal: 3 steps; on right going up Has following equipment at home: None  PLOF: Independent and Vocation/Vocational requirements: Worked in distribution in [employer]   PATIENT GOALS: Get back movement in my left arm, return to work, and be able to play video games like I was before.  OBJECTIVE:  Note: Objective measures were completed at Evaluation unless otherwise noted.  HAND DOMINANCE: Right  ADLs: Overall ADLs: Independent Transfers/ambulation related to ADLs: Independent Eating: Independent Grooming: Independent UB Dressing: Independent LB Dressing: Independent Toileting: Independent Bathing: Independent Tub  Shower transfers: Independent, shower/tub combo Equipment: none  IADLs: Shopping: Independent Light housekeeping: Mom is doing pharmacologist.  Meal Prep: Needs some help with opening cans and jars d/t weakness in L hand Community mobility: Mother driving pt to appts  Medication management: Mother helping with medication mgmt Financial management: Independent Handwriting: did not assess d/t pt report of no changes and pt dominant hand unaffected.   MOBILITY STATUS: Independent  POSTURE COMMENTS:  No Significant postural limitations Sitting balance: WFL  ACTIVITY TOLERANCE: Activity tolerance: No changes  FUNCTIONAL OUTCOME MEASURES: Quick Dash: 47.7/100: 47.7% impairment  UPPER EXTREMITY ROM:  R WNL  Active ROM Right eval Left eval  Shoulder flexion  92  Shoulder abduction  70  Shoulder adduction    Shoulder extension    Shoulder internal rotation    Shoulder external rotation    Elbow flexion    Elbow extension    Wrist flexion  10  Wrist extension  40  Wrist ulnar deviation    Wrist radial deviation    Wrist pronation    Wrist supination  Impaired (re-assess)  (Blank rows = not tested)  UPPER EXTREMITY MMT:   WFL RUE, 3-/5 grossly for LUE  MMT Right eval Left eval  Shoulder flexion    Shoulder  abduction    Shoulder adduction    Shoulder extension    Shoulder internal rotation    Shoulder external rotation    Middle trapezius    Lower trapezius    Elbow flexion    Elbow extension    Wrist flexion    Wrist extension    Wrist ulnar deviation    Wrist radial deviation    Wrist pronation    Wrist supination    (Blank rows = not tested)  HAND FUNCTION: Grip strength: Right: 61.2 lbs; Left: 15.8 lbs  COORDINATION: 9 Hole Peg test: Right: 41.60 sec; Left: unable to pick up a peg in 2 minutes sec Box and Blocks:  Right 36 blocks, Left 11blocks  SENSATION: WFL  EDEMA: none  MUSCLE TONE: LUE: Rigidity  COGNITION: Overall cognitive status: Within  functional limits for tasks assessed  VISION: Subjective report: no concerns Baseline vision: No visual deficits Visual history: no hx  VISION ASSESSMENT: WFL To be further assessed in functional context  Patient has difficulty with following activities due to following visual impairments: none  PERCEPTION: WFL  PRAXIS: Impaired: Ideomotor  OBSERVATIONS: Weakness in L hand and limited ROM in LUE affecting ability to complete ADL/IADL.                                                                                                                              TREATMENT - Neuro re-education completed for duration as noted below including:  OT educated patient on use of NMES unit as well as e-stim to encourage improved nerve function as needed to produce more active LUE ROM and increase functional use through muscular re-education. Therapist educated patient on application for L shoulder flexion and applied electrodes accordingly. Therapist utilized e-stim Russian waveform with 10/10 cycle time, 50% duty cycle, burst frequency of 50 bps, and ramp of 2 seconds. Patient tolerating up to 30 intensity with supervised application to promote improved AROM and pain management. Therapist cued patient to complete shoulder flexion activities during the NMES "on" cycle and shoulder extension during the "off" cycle to replicate functional reach and placement of the affected LUE. Functional activities incorporated included reaching for his phone and reaching for a cone in gravity eliminated and gravity included planes to promote carryover to daily task. Cueing was required to fully extend elbow and to not compensate with trunk.  Next engaged pt in therapeutic activities honing L shoulder flexion in gravity eliminated plane, engaging in seated simulated air hockey game with tissue boxes and a balled up piece of paper. Next honed FM coordination and midline reach and shoulder flexion of LUE further, engaging  in tip to tip pinch to pick up game pieces and participate in a game of connect 4.   PATIENT EDUCATION: Education details: Importance of weight bearing on affected LUE  Person educated: Patient Education method: Explanation, Demonstration, Tactile cues, Verbal cues, and Handouts Education comprehension: verbalized understanding, returned demonstration, verbal cues required, and needs  further education  HOME EXERCISE PROGRAM: 12/30/23: Shoulder, Forearm/wrist, and hand HEP  01/02/24: Theraputty HEP with red putty (ACCESS CODE [id number]) 01/11/24: Shoulder shrug exercises (same access code as above)  GOALS: Goals reviewed with patient? Yes  SHORT TERM GOALS: Target date: 01/23/24  Pt will be independent with LUE ROM, coordination, and grip strengthening HEP Baseline: New to OP OT Goal status: IN PROGRESS  2.  Pt will increase L shoulder abduction to at least 90 degrees for improved functional use  Baseline: 70 degrees  Goal status:IN PROGRESS  3.  Pt will be independent with sleeping strategies for hemiparesis and hemiplegia of LUE Baseline: New to OP OT Goal status: IN PROGRESS  4.  Pt will be independent with resting hand splint wear and care for night use and to improve functional use in L hand Baseline: New to OP OT Goal status: IN PROGRESS   LONG TERM GOALS: Target date: 02/22/24  Pt will decrease QuickDASH score to no more than 25/100 to demonstrate improved function in daily tasks Baseline: 47.5/100 Goal status: IN PROGRESS  2.  Pt will increase L shoulder flexion ROM to at least 120 degrees for improved functional use Baseline: 92 degrees Goal status:IN PROGRESS  3.  Pt will increase L shoulder abduction ROM to at least 100 degrees for improved functional use Baseline: 70 degrees Goal status: IN PROGRESS  4.  Pt will increase L wrist flexion and extension by at least 40 degrees and 20 degrees, respectively. Baseline: 10 degrees wrist flexion, 40 degrees wrist  extension Goal status: IN PROGRESS  ASSESSMENT:  CLINICAL IMPRESSION: Patient presents with L UE pain and weakness secondary to CVA. He demonstrates good rehab potential, as evidenced by good participation in estim and activities honing LUE ROM and FM coordination. He will continue to benefit from skilled outpatient OT to improve functional use of his L UE for ADLs and IADLs.   PERFORMANCE DEFICITS: in functional skills including ADLs, IADLs, coordination, dexterity, tone, ROM, strength, pain, Fine motor control, body mechanics, cardiopulmonary status limiting function, and UE functional use, cognitive skills including attention, safety awareness, and sequencing, and psychosocial skills including coping strategies, interpersonal interactions, and routines and behaviors.   IMPAIRMENTS: are limiting patient from IADLs, rest and sleep, work, and leisure.   CO-MORBIDITIES: has co-morbidities such as ESRD that affects occupational performance. Patient will benefit from skilled OT to address above impairments and improve overall function.  REHAB POTENTIAL: Good   PLAN:  OT FREQUENCY: 2x/week  OT DURATION: 8 weeks  PLANNED INTERVENTIONS: 97168 OT Re-evaluation, 97535 self care/ADL training, 02889 therapeutic exercise, 97530 therapeutic activity, 97112 neuromuscular re-education, 97140 manual therapy, 97032 electrical stimulation (manual), 97760 Orthotic Initial, [id number] Orthotic/Prosthetic subsequent, passive range of motion, functional mobility training, visual/perceptual remediation/compensation, energy conservation, patient/family education, and DME and/or AE instructions  RECOMMENDED OTHER SERVICES: none noted  CONSULTED AND AGREED WITH PLAN OF CARE: Patient  PLAN FOR NEXT SESSION:  Continue with E-stim- L shoulder Coordination/ strengthening activities ROM activities   [employer], OT 01/13/2024, 9:42 AM

## 2024-01-16 ENCOUNTER — Encounter: Payer: Self-pay | Admitting: Physical Therapy

## 2024-01-16 ENCOUNTER — Ambulatory Visit: Attending: Family Medicine | Admitting: Physical Therapy

## 2024-01-16 ENCOUNTER — Ambulatory Visit: Admitting: Occupational Therapy

## 2024-01-16 VITALS — BP 124/87 | HR 82

## 2024-01-16 DIAGNOSIS — R2689 Other abnormalities of gait and mobility: Secondary | ICD-10-CM | POA: Diagnosis present

## 2024-01-16 DIAGNOSIS — I69854 Hemiplegia and hemiparesis following other cerebrovascular disease affecting left non-dominant side: Secondary | ICD-10-CM | POA: Diagnosis present

## 2024-01-16 DIAGNOSIS — R29818 Other symptoms and signs involving the nervous system: Secondary | ICD-10-CM | POA: Insufficient documentation

## 2024-01-16 DIAGNOSIS — R29898 Other symptoms and signs involving the musculoskeletal system: Secondary | ICD-10-CM | POA: Diagnosis present

## 2024-01-16 DIAGNOSIS — R41840 Attention and concentration deficit: Secondary | ICD-10-CM | POA: Insufficient documentation

## 2024-01-16 DIAGNOSIS — M6281 Muscle weakness (generalized): Secondary | ICD-10-CM | POA: Diagnosis present

## 2024-01-16 DIAGNOSIS — R278 Other lack of coordination: Secondary | ICD-10-CM | POA: Diagnosis present

## 2024-01-16 DIAGNOSIS — I63411 Cerebral infarction due to embolism of right middle cerebral artery: Secondary | ICD-10-CM | POA: Insufficient documentation

## 2024-01-16 NOTE — Therapy (Cosign Needed)
 OUTPATIENT OCCUPATIONAL THERAPY NEURO TREATMENT  Patient Name: Nathan Campbell MRN: [id number] DOB:[birth year], 48 y.o., male Today's Date: 01/16/2024  PCP: None REFERRING PROVIDER: Sebastian Jerilyn HERO, FNP  END OF SESSION:  OT End of Session - 01/16/24 1349     Visit Number 6    Number of Visits 16    Date for Recertification  02/22/24    Authorization Type [employer] [facility]    Authorization Time Period 12/30/23-02/24/24    Authorization - Number of Visits 17    Progress Note Due on Visit 10    OT Start Time 1105    OT Stop Time 1147    OT Time Calculation (min) 42 min    Equipment Utilized During Treatment E-stim    Activity Tolerance Patient tolerated treatment well    Behavior During Therapy [facility] for tasks assessed/performed         Past Medical History:  Diagnosis Date   Acute combined systolic and diastolic CHF, NYHA class 4 (HCC) 06/10/2016   Anemia    Cardiomyopathy (HCC) 06/14/2016   CHF (congestive heart failure) (HCC)    COVID 10/2020   Dyspnea    ESRD on hemodialysis (HCC)    TTS at [facility]   Hypertension    Hypertensive heart and kidney disease with heart failure (HCC) 06/14/2016   Hypertensive heart disease with congestive heart failure (HCC)    Obesity    Past Surgical History:  Procedure Laterality Date   A/V FISTULAGRAM Left 05/03/2022   Procedure: A/V Fistulagram;  Surgeon: Eliza Lonni RAMAN, MD;  Location: [facility] INVASIVE CV LAB;  Service: Cardiovascular;  Laterality: Left;   AV FISTULA PLACEMENT Left 11/11/2020   Procedure: LEFT ARM First Stage Basilic Vein Fistula Creation;  Surgeon: Sheree Penne Lonni, MD;  Location: [facility] OR;  Service: Vascular;  Laterality: Left;   BASCILIC VEIN TRANSPOSITION Left 01/23/2021   Procedure: Second Stage Basilic Vein Transposition of Left Arm Arteriovenous  Fistula;  Surgeon: Sheree Penne Lonni, MD;  Location: [facility] OR;  Service: Vascular;  Laterality: Left;  Block  to LMA    COLONOSCOPY     FISTULA  SUPERFICIALIZATION Left 05/07/2022   Procedure: PLICATION OF ANEURYSM, LEFT ARM FISTULA;  Surgeon: Eliza Lonni RAMAN, MD;  Location: [facility] OR;  Service: Vascular;  Laterality: Left;   IR CT HEAD LTD  10/07/2023   IR CT HEAD LTD  10/07/2023   IR PATIENT EVAL TECH 0-60 MINS  10/07/2023   IR PERCUTANEOUS ART THROMBECTOMY/INFUSION INTRACRANIAL INC DIAG ANGIO  10/07/2023   NO PAST SURGERIES     PERIPHERAL VASCULAR BALLOON ANGIOPLASTY Left 05/03/2022   Procedure: PERIPHERAL VASCULAR BALLOON ANGIOPLASTY;  Surgeon: Eliza Lonni RAMAN, MD;  Location: [facility] INVASIVE CV LAB;  Service: Cardiovascular;  Laterality: Left;  AVF   RADIOLOGY WITH ANESTHESIA N/A 10/07/2023   Procedure: RADIOLOGY WITH ANESTHESIA;  Surgeon: Radiologist, Medication, MD;  Location: [facility] OR;  Service: Radiology;  Laterality: N/A;   Patient Active Problem List   Diagnosis Date Noted   Stroke (HCC) 11/04/2023   Cognitive and neurobehavioral dysfunction 10/24/2023   Acute hypoxic respiratory failure (HCC) 10/17/2023   Essential hypertension 10/17/2023   Dysphagia 10/17/2023   Leukocytosis 10/17/2023   Obesity 10/17/2023   Acute ischemic stroke (HCC) 10/07/2023   Middle cerebral artery embolism, right 10/07/2023   ESRD (end stage renal disease) (HCC) 03/26/2022   Hyperlipidemia, unspecified 03/20/2021   Acute gout due to renal impairment involving left wrist 04/24/2019   Malignant hypertension 10/14/2016   Combined congestive systolic and  diastolic heart failure (HCC) 06/14/2016   NICM (nonischemic cardiomyopathy) (HCC) 06/14/2016   Renal failure    Hypertensive urgency 06/10/2016   CKD (chronic kidney disease), stage IV (HCC) 06/10/2016   Normocytic anemia 06/10/2016    ONSET DATE: 12/19/2023 referral date Admit date: 10/18/2023 Discharge date: 11/04/2023  REFERRING DIAG: G81.90 (ICD-10-CM) - Hemiplegia, unspecified affecting unspecified side  THERAPY DIAG:  Muscle weakness (generalized)  Other symptoms and signs involving  the nervous system  Other lack of coordination  Cerebrovascular accident (CVA) due to embolism of right middle cerebral artery (HCC)  Rationale for Evaluation and Treatment: Rehabilitation  SUBJECTIVE:   SUBJECTIVE STATEMENT: The patient reports, I have bad news--I've been urinating and there has been blood. He has already reported this to his doctor and has a follow-up appointment on Friday regarding his kidney.  The patient also reports, I have been able to play my game since starting E-stim, which I haven't been able to do previously.  Pt accompanied by: self  PERTINENT HISTORY: Pt is a 48 yo male presenting to this clinic with hemiplegia s/p R MCA embolism. Pt was hospitalized for this from 10/07/23-10/18/23, then discharged to neurology and received IP PT/OT/SLP 10/18/23-11/04/23. Initial CT head revealed right MCA territory ischemic changes with calcific density in proximal right M1 concerning for calcific embolus.   CHF, CM, ESRD on HD TTS, HTN, obesity, HLD  PRECAUTIONS: Fall, L sided weakness, blood thinners  WEIGHT BEARING RESTRICTIONS: No  PAIN:  Are you having pain? No  FALLS: Has patient fallen in last 6 months? No  LIVING ENVIRONMENT: Lives with: lives with their family sister and her 2 sons. Pt reports will be getting own place soon. Lives in: House/apartment townhouse, 2 level Stairs: Yes: Internal: 3 steps; on right going up Has following equipment at home: None  PLOF: Independent and Vocation/Vocational requirements: Worked in distribution in [employer]   PATIENT GOALS: Get back movement in my left arm, return to work, and be able to play video games like I was before.  OBJECTIVE:  Note: Objective measures were completed at Evaluation unless otherwise noted.  HAND DOMINANCE: Right  ADLs: Overall ADLs: Independent Transfers/ambulation related to ADLs: Independent Eating: Independent Grooming: Independent UB Dressing: Independent LB Dressing:  Independent Toileting: Independent Bathing: Independent Tub Shower transfers: Independent, shower/tub combo Equipment: none  IADLs: Shopping: Independent Light housekeeping: Mom is doing pharmacologist.  Meal Prep: Needs some help with opening cans and jars d/t weakness in L hand Community mobility: Mother driving pt to appts  Medication management: Mother helping with medication mgmt Financial management: Independent Handwriting: did not assess d/t pt report of no changes and pt dominant hand unaffected.   MOBILITY STATUS: Independent  POSTURE COMMENTS:  No Significant postural limitations Sitting balance: WFL  ACTIVITY TOLERANCE: Activity tolerance: No changes  FUNCTIONAL OUTCOME MEASURES: Quick Dash: 47.7/100: 47.7% impairment  UPPER EXTREMITY ROM:  R WNL  Active ROM Right eval Left eval  Shoulder flexion  92  Shoulder abduction  70  Wrist flexion  10  Wrist extension  40  Wrist pronation    Wrist supination  Impaired (re-assess)  (Blank rows = not tested)  UPPER EXTREMITY MMT:   WFL RUE, 3-/5 grossly for LUE  HAND FUNCTION: Grip strength: Right: 61.2 lbs; Left: 15.8 lbs  COORDINATION: 9 Hole Peg test: Right: 41.60 sec; Left: unable to pick up a peg in 2 minutes sec Box and Blocks:  Right 36 blocks, Left 11blocks  SENSATION: WFL  EDEMA: none  MUSCLE  TONE: LUE: Rigidity  COGNITION: Overall cognitive status: Within functional limits for tasks assessed  VISION: Subjective report: no concerns Baseline vision: No visual deficits Visual history: no hx  VISION ASSESSMENT: WFL To be further assessed in functional context  Patient has difficulty with following activities due to following visual impairments: none  PERCEPTION: WFL  PRAXIS: Impaired: Ideomotor  OBSERVATIONS: Weakness in L hand and limited ROM in LUE affecting ability to complete ADL/IADL.                                                                                                                             TREATMENT - Self-care/home management completed for duration as noted below including: Patient verbalized interest in motion assisting glove for improving L hand function. OT browsed different websites with patient, incorporating patient input to identify items available for purchase on [employer]. OT explained the purpose of the motion assisting glove to the patient, including its use in supporting hand function, improving finger and wrist mobility, facilitating grasp and release, and assisting with functional activities.  Pt was also provided with information on E-stim resources, including those outlined in the patient's home exercise instructions for continued muscle education of the L UE. OT explained to the patient the difference between the motion assisting glove and E-stim: the motion assisting glove is designed to stretch and assist finger and hand muscles, while E-stim stimulates nerves to promote muscle activation from the shoulder to the hand, depending on electrode placement. Patient verbalized understanding.  - Neuro re-education completed for duration as noted below including:  OT educated patient on use of NMES unit as well as e-stim to encourage improved nerve function as needed to produce more active LUE ROM and increase functional use through muscular re-education. Therapist educated patient on application for L shoulder flexion and applied electrodes accordingly. Therapist utilized e-stim Russian waveform with 10/10 cycle time, 50% duty cycle, burst frequency of 50 bps, and ramp of 2 seconds. Patient tolerating up to 17 intensity with supervised application to promote improved AROM and pain management. Therapist cued patient to complete shoulder flexion activities during the NMES "on" cycle and shoulder extension during the "off" cycle to replicate functional reach and placement of the affected LUE. Functional activities incorporated included reaching for blocks at varying  angles, grasping them for 10 seconds, and then releasing them into a container placed on his left side.   PATIENT EDUCATION: Education details: immunologist vs E-stim Person educated: Patient Education method: Explanation, Demonstration, Tactile cues, Verbal cues, and Handouts Education comprehension: verbalized understanding, returned demonstration, verbal cues required, and needs further education  HOME EXERCISE PROGRAM: 12/30/23: Shoulder, Forearm/wrist, and hand HEP  01/02/24: Theraputty HEP with red putty (ACCESS CODE [id number]) 01/11/24: Shoulder shrug exercises (same access code as above) 01/16/24: E-stim and saebo glove [employer] resources  GOALS: Goals reviewed with patient? Yes  SHORT TERM GOALS: Target date: 01/23/24  Pt will be independent with LUE ROM, coordination, and grip  strengthening HEP Baseline: New to OP OT Goal status: IN PROGRESS  2.  Pt will increase L shoulder abduction to at least 90 degrees for improved functional use  Baseline: 70 degrees  Goal status:IN PROGRESS  3.  Pt will be independent with sleeping strategies for hemiparesis and hemiplegia of LUE Baseline: New to OP OT Goal status: IN PROGRESS  4.  Pt will be independent with resting hand splint wear and care for night use and to improve functional use in L hand Baseline: New to OP OT Goal status: IN PROGRESS   LONG TERM GOALS: Target date: 02/22/24  Pt will decrease QuickDASH score to no more than 25/100 to demonstrate improved function in daily tasks Baseline: 47.5/100 Goal status: IN PROGRESS  2.  Pt will increase L shoulder flexion ROM to at least 120 degrees for improved functional use Baseline: 92 degrees Goal status:IN PROGRESS  3.  Pt will increase L shoulder abduction ROM to at least 100 degrees for improved functional use Baseline: 70 degrees Goal status: IN PROGRESS  4.  Pt will increase L wrist flexion and extension by at least 40 degrees and 20 degrees,  respectively. Baseline: 10 degrees wrist flexion, 40 degrees wrist extension Goal status: IN PROGRESS  ASSESSMENT:  CLINICAL IMPRESSION: Patient presents with L UE pain and weakness secondary to CVA. He demonstrates good rehab potential, as evidenced by good participation in estim and activities honing LUE ROM and FM coordination. He will continue to benefit from skilled outpatient OT to improve functional use of his L UE for ADLs and IADLs.   PERFORMANCE DEFICITS: in functional skills including ADLs, IADLs, coordination, dexterity, tone, ROM, strength, pain, Fine motor control, body mechanics, cardiopulmonary status limiting function, and UE functional use, cognitive skills including attention, safety awareness, and sequencing, and psychosocial skills including coping strategies, interpersonal interactions, and routines and behaviors.   IMPAIRMENTS: are limiting patient from IADLs, rest and sleep, work, and leisure.   CO-MORBIDITIES: has co-morbidities such as ESRD that affects occupational performance. Patient will benefit from skilled OT to address above impairments and improve overall function.  REHAB POTENTIAL: Good   PLAN:  OT FREQUENCY: 2x/week  OT DURATION: 8 weeks  PLANNED INTERVENTIONS: 97168 OT Re-evaluation, 97535 self care/ADL training, 02889 therapeutic exercise, 97530 therapeutic activity, 97112 neuromuscular re-education, 97140 manual therapy, 97032 electrical stimulation (manual), 97760 Orthotic Initial, [id number] Orthotic/Prosthetic subsequent, passive range of motion, functional mobility training, visual/perceptual remediation/compensation, energy conservation, patient/family education, and DME and/or AE instructions  RECOMMENDED OTHER SERVICES: none noted  CONSULTED AND AGREED WITH PLAN OF CARE: Patient  PLAN FOR NEXT SESSION:  Continue with E-stim- L shoulder Coordination/ strengthening activities ROM activities   Marchele Decock, Student-OT 01/16/2024, 3:54  PM

## 2024-01-16 NOTE — Therapy (Signed)
 OUTPATIENT PHYSICAL THERAPY NEURO TREATMENT   Patient Name: Nathan Campbell MRN: [id number] DOB:23-Jun-1975, 48 y.o., male Today's Date: 01/16/2024   PCP: Pt reports PCP is [facility] but did not give any further identifying information. REFERRING PROVIDER: Sebastian Jerilyn HERO, FNP  END OF SESSION:  PT End of Session - 01/16/24 0928     Visit Number 8    Number of Visits 17   16 plus Eval   Date for Recertification  02/24/24   For scheduling delays   Authorization Type [facility] Medicare    Authorization Time Period 12/26/23 - 02/20/24    Authorization - Visit Number 8    Authorization - Number of Visits 17    PT Start Time 0928    PT Stop Time 1013    PT Time Calculation (min) 45 min    Equipment Utilized During Treatment Gait belt    Activity Tolerance Patient tolerated treatment well    Behavior During Therapy WFL for tasks assessed/performed          Past Medical History:  Diagnosis Date   Acute combined systolic and diastolic CHF, NYHA class 4 (HCC) 06/10/2016   Anemia    Cardiomyopathy (HCC) 06/14/2016   CHF (congestive heart failure) (HCC)    COVID 10/2020   Dyspnea    ESRD on hemodialysis (HCC)    TTS at [facility]   Hypertension    Hypertensive heart and kidney disease with heart failure (HCC) 06/14/2016   Hypertensive heart disease with congestive heart failure (HCC)    Obesity    Past Surgical History:  Procedure Laterality Date   A/V FISTULAGRAM Left 05/03/2022   Procedure: A/V Fistulagram;  Surgeon: Eliza Lonni RAMAN, MD;  Location: [facility] INVASIVE CV LAB;  Service: Cardiovascular;  Laterality: Left;   AV FISTULA PLACEMENT Left 11/11/2020   Procedure: LEFT ARM First Stage Basilic Vein Fistula Creation;  Surgeon: Sheree Penne Lonni, MD;  Location: [facility] OR;  Service: Vascular;  Laterality: Left;   BASCILIC VEIN TRANSPOSITION Left 01/23/2021   Procedure: Second Stage Basilic Vein Transposition of Left Arm Arteriovenous  Fistula;  Surgeon: Sheree Penne Lonni, MD;  Location: [facility] OR;  Service: Vascular;  Laterality: Left;  Block  to LMA    COLONOSCOPY     FISTULA SUPERFICIALIZATION Left 05/07/2022   Procedure: PLICATION OF ANEURYSM, LEFT ARM FISTULA;  Surgeon: Eliza Lonni RAMAN, MD;  Location: [facility] OR;  Service: Vascular;  Laterality: Left;   IR CT HEAD LTD  10/07/2023   IR CT HEAD LTD  10/07/2023   IR PATIENT EVAL TECH 0-60 MINS  10/07/2023   IR PERCUTANEOUS ART THROMBECTOMY/INFUSION INTRACRANIAL INC DIAG ANGIO  10/07/2023   NO PAST SURGERIES     PERIPHERAL VASCULAR BALLOON ANGIOPLASTY Left 05/03/2022   Procedure: PERIPHERAL VASCULAR BALLOON ANGIOPLASTY;  Surgeon: Eliza Lonni RAMAN, MD;  Location: [facility] INVASIVE CV LAB;  Service: Cardiovascular;  Laterality: Left;  AVF   RADIOLOGY WITH ANESTHESIA N/A 10/07/2023   Procedure: RADIOLOGY WITH ANESTHESIA;  Surgeon: Radiologist, Medication, MD;  Location: [facility] OR;  Service: Radiology;  Laterality: N/A;   Patient Active Problem List   Diagnosis Date Noted   Stroke (HCC) 11/04/2023   Cognitive and neurobehavioral dysfunction 10/24/2023   Acute hypoxic respiratory failure (HCC) 10/17/2023   Essential hypertension 10/17/2023   Dysphagia 10/17/2023   Leukocytosis 10/17/2023   Obesity 10/17/2023   Acute ischemic stroke (HCC) 10/07/2023   Middle cerebral artery embolism, right 10/07/2023   ESRD (end stage renal disease) (HCC) 03/26/2022   Hyperlipidemia,  unspecified 03/20/2021   Acute gout due to renal impairment involving left wrist 04/24/2019   Malignant hypertension 10/14/2016   Combined congestive systolic and diastolic heart failure (HCC) 06/14/2016   NICM (nonischemic cardiomyopathy) (HCC) 06/14/2016   Renal failure    Hypertensive urgency 06/10/2016   CKD (chronic kidney disease), stage IV (HCC) 06/10/2016   Normocytic anemia 06/10/2016    ONSET DATE: 12/14/2023- date of referral  REFERRING DIAG: G81.90 (ICD-10-CM) - Hemiparesis (HCC)  THERAPY DIAG:  Other lack of  coordination  Hemiplegia and hemiparesis following other cerebrovascular disease affecting left non-dominant side (HCC)  Muscle weakness (generalized)  Other symptoms and signs involving the musculoskeletal system  Other abnormalities of gait and mobility  Rationale for Evaluation and Treatment: Rehabilitation  SUBJECTIVE:  Likes to be called Nathan Campbell.                                                                                                                                                                                            SUBJECTIVE STATEMENT: Pt reports no recent falls.  Pt reports not doing a lot this weekend except chill with his daughter.  Pt reports his BP dropped again during dialysis on Saturday but felt better on Sunday and went to church.  Pt reports having blood in urine last 3 days but feels fine and wants to do therapy.  Pt reports he has a planned follow-up with kidney doctor this afternoon (via video chat) to talk about MRI and pt plans to talk with MD at that time regarding bleeding. Pt accompanied by: self.  Mom drove pt.  PERTINENT HISTORY: Hospitalization 10/07/23 for acute onset of left-sided weakness, left side neglect, dysarthria and right gaze preference; imaging showing acute right MCA CVA with right M1 occlusion (intubated for airway protection; s/p TNK; s/p revascularization of R MCA with IR 7/25).  IP rehab stay 10/18/23-11/04/23. Urgent care visit 12/02/23 for gout (L foot).  PMH includes: gout, CKD, ESRD on HD T/R/Sat, CHF, anemia, htn, stroke 10/07/23, acute hypoxic respiratory failure, dysphagia, cardiomyopathy, and obesity (BMI 41).  PAIN:  Are you having pain? Yes: NPRS scale: 0/10 currently Pain location: L shoulder Pain description: Aching Aggravating factors: Laying down Relieving factors: Muscle relaxer  PRECAUTIONS: Fall; fistula in LUE (dialysis T/Th/Sat)  RED FLAGS: None   WEIGHT BEARING RESTRICTIONS: No  FALLS: Has patient fallen in last 6  months? No  LIVING ENVIRONMENT: Lives with: Sister and her 2 sons; mom currently staying with pt to assist as needed Lives in: Other Townhouse--stays on 2nd floor Stairs: Yes: Internal: 5 steps; on right going up and External: 0 steps; none Has following  equipment at home: Crutches  PLOF: Independent  PATIENT GOALS: To get more function in arm; get voice back; get strength up.  OBJECTIVE:  Note: Objective measures were completed at Evaluation unless otherwise noted.  COGNITION: Overall cognitive status: A&Ox4; possible decreased awareness of deficits  COORDINATION:  Rapid Alternating toe taps (in sitting): Decreased L LE  Heel to shin (in sitting); mild decreased L LE  LOWER EXTREMITY ROM:     Active  Right Eval Left Eval  Hip flexion WNL WNL  Hip extension    Hip abduction    Hip adduction    Hip internal rotation    Hip external rotation    Knee flexion WNL WNL  Knee extension WNL WNL  Ankle dorsiflexion WNL WNL  Ankle plantarflexion WNL WNL  Ankle inversion    Ankle eversion     (Blank rows = not tested)  LOWER EXTREMITY MMT:    MMT Right Eval Left Eval  Hip flexion 5/5 4+/5  Hip extension    Hip abduction    Hip adduction    Hip internal rotation    Hip external rotation    Knee flexion 5/5 4+/5  Knee extension 5/5 5/5  Ankle dorsiflexion 5/5 4+/5  Ankle plantarflexion At least 3/5 AROM At least 3/5 AROM  Ankle inversion    Ankle eversion    (Blank rows = not tested) GAIT: Findings: Gait Characteristics: step through pattern, decreased arm swing- Left, decreased ankle dorsiflexion- Left, and wide BOS, Distance walked: clinic distances, Assistive device utilized:None, Level of assistance: SBA, and Comments: pt tending to walk close to items on L side but did not bump into anything  FUNCTIONAL TESTS:  5 times sit to stand: 20.81 seconds 10 meter walk test: 1.25 m/sec (7.97 seconds); no AD use Functional gait assessment: 18/30 (12/26/23) SLS: R LE  1st 2 trials about 1 second each but on 3rd trial 9.0 seconds; L LE 1st 2 trials about 1 second each but on 3rd trial 30 seconds L LE (time stopped by therapist at 30 seconds) TUG (Manual): 9.41 seconds 12/26/23 TUG (Cognitive): 26.25 seconds 12/26/23  FUNCTIONAL GAIT ASSESSMENT  Date: 12/26/23 Score  GAIT LEVEL SURFACE Instructions: Walk at your normal speed from here to the next mark (6 m) [20 ft]. (1) Moderate impairment-Walks 6 m (20 ft), slow speed, abnormal gait pattern, evidence for imbalance, or deviates 25.4 - 38.1 cm (10 -15 in) outside of the 30.48-cm (12-in) walkway width. Requires more than 7 seconds to ambulate 6 m (20 ft). 8.53 seconds  2.   CHANGE IN GAIT SPEED Instructions: Begin walking at your normal pace (for 1.5 m [5 ft]). When I tell you "go," walk as fast as you can (for 1.5 m [5 ft]). When I tell you "slow," walk as slowly as you can (for 1.5 m [5 ft]. (2) Mild impairment - Is able to change speed but demonstrates mild gait deviations, deviates 15.24 -25.4 cm (6 -10 in) outside of the 30.48-cm (12-in) walkway width, or no gait deviations but unable to achieve a significant change in velocity, or uses an assistive device  3.    GAIT WITH HORIZONTAL HEAD TURNS Instructions: Walk from here to the next mark 6 m (20 ft) away. Begin walking at your normal pace. Keep walking straight; after 3 steps, turn your head to the right and keep walking straight while looking to the right. After 3 more steps, turn your head to the left and keep walking straight while looking left. Continue  alternating looking right and left. (2) Mild impairment - Performs head turns smoothly with slight change in gait velocity (eg, minor disruption to smooth gait path), deviates 15.24 -25.4 cm (6 -10 in) outside 30.48-cm (12-in) walkway width, or uses an assistive device.  4.   GAIT WITH VERTICAL HEAD TURNS Instructions: Walk from here to the next mark (6 m [20 ft]). Begin walking at your normal pace. Keep  walking straight; after 3 steps, tip your head up and keep walking straight while looking up. After 3 more steps, tip your head down, keep walking straight while looking down. Continue  alternating looking up and down every 3 steps until you have completed 2 repetitions in each direction. (2) Mild impairment - Performs task with slight change in gait velocity (eg, minor disruption to smooth gait path), deviates 15.24 -25.4 cm (6 -10 in) outside 30.48-cm (12-in) walkway width or uses assistive device.  5.  GAIT AND PIVOT TURN Instructions: Begin with walking at your normal pace. When I tell you, "turn and stop," turn as quickly as you can to face the opposite direction and stop. (2) Mild impairment - Pivot turns safely in 3 seconds and stops with no loss of balance, or pivot turns safely within 3 seconds and stops with mild imbalance, requires small steps to catch balance  6.   STEP OVER OBSTACLE Instructions: Begin walking at your normal speed. When you come to the shoe box, step over it, not around it, and keep walking. (2) Mild impairment - Is able to step over one shoe box (11.43 cm [4.5 in] total height) without changing gait speed; no evidence of imbalance.  7.   GAIT WITH NARROW BASE OF SUPPORT Instructions: Walk on the floor with arms folded across the chest, feet aligned heel to toe in tandem for a distance of 3.6 m [12 ft]. The number of steps taken in a straight line are counted for a maximum of 10 steps. (2) Mild impairment - Ambulates 7-9 steps  8.   GAIT WITH EYES CLOSED Instructions: Walk at your normal speed from here to the next mark (6 m [20 ft]) with your eyes closed. (2) Mild impairment - Walks 6 m (20 ft), uses assistive device, slower speed, mild gait deviations, deviates 15.24 -25.4 cm (6 -10 in) outside 30.48-cm (12-in) walkway width. Ambulates 6 m (20 ft) in less than 9 seconds but greater than 7 seconds 7.72 seconds  9.   AMBULATING BACKWARDS Instructions: Walk backwards until I  tell you to stop (2) Mild impairment - Walks 6 m (20 ft), uses assistive device, slower speed, mild gait deviations, deviates 15.24 -25.4 cm (6 -10 in) outside 30.48-cm (12-in) walkway width12.06 seconds  10. STEPS Instructions: Walk up these stairs as you would at home (ie, using the rail if necessary). At the top turn around and walk down. (1) Moderate impairment-Two feet to a stair; must use rail.  Total 18/30   Interpretation of scores: Non-Specific Older Adults Cutoff Score: <=22/30 = risk of falls Parkinson's Disease Cutoff score <15/30= fall risk (Hoehn & Yahr 1-4)  Minimally Clinically Important Difference (MCID)  Stroke (acute, subacute, and chronic) = MDC: 4.2 points Vestibular (acute) = MDC: 6 points Community Dwelling Older Adults =  MCID: 4 points Parkinson's Disease  =  MDC: 4.3 points  (Academy of Neurologic Physical Therapy (nd). Functional Gait Assessment. Retrieved from https://www.neuropt.org/docs/default-source/cpgs/core-outcome-measures/function-gait-assessment-pocket-guide-proof9-(2).pdf?sfvrsn=b59f35043_0.)   PATIENT SURVEYS:  Stroke Impact Scale : TBA  TREATMENT DATE: 01/16/2024  Self Care BP and HR taken in sitting at rest beginning/end of session (see below for details). Vitals:   01/16/24 0943  BP: 124/87  Pulse: 82  Above vitals beginning of session. End of session: BP 126/88 with HR 80 bpm. No adverse symptoms reported during session. Notes:  Good capillary refill noted on fingers B hands.  Therapeutic Exercise: Supine Bridging: x10 reps single leg bridging (10 reps x2 sets each LE); decreased AROM noted L LE Sidelying clamshells: x10 reps B LE's (x2 sets of 10 reps each LE); decreased AROM noted L LE  Notes: HR 84 bpm post activity  Therapeutic Activity: Sit to stands on [employer]: x1 rep with B UE's; x5 reps (x2 sets) no UE  support; SBA for safety; increased ankle strategies noted; 2/10 RPE reported post activity SLS (firm surface): L LE x30 seconds (limited time by therapist); R LE x18 seconds (limited per pt balance) and then x30 seconds (limited time by therapist); sway noted but pt able to self correct   PATIENT EDUCATION: Education details: Education on pacing with exercises/activities.  Educated pt to call PCP regarding reported blood in urine (pt reports he will do this plus talk with kidney MD this afternoon with planned video chat); OT updated d/t having OT later this morning.  Did not yet receive referral for SLP yet; encouraged pt to call his PCP and request SLP referral. Person educated: Patient Education method: Explanation, Demonstration, and Verbal cues Education comprehension: verbalized understanding  HOME EXERCISE PROGRAM: 01/13/24 Access Code: [email or web address] URL: [email or web address] Date: 01/13/2024 Prepared by: Damien Caulk  Exercises - Standing March with Counter Support  - 1 x daily - 5 x weekly - 2 sets - 10 reps - Standing Hip Abduction with Counter Support  - 1 x daily - 5 x weekly - 2 sets - 10 reps - Standing Single Leg Stance with Counter Support  - 1 x daily - 5 x weekly - 2 sets - 1 reps - 30 second hold - Supine Bridge  - 1 x daily - 5 x weekly - 2 sets - 10 reps - Clamshell  - 1 x daily - 5 x weekly - 2 sets - 10 reps  GOALS: Goals reviewed with patient? Yes  SHORT TERM GOALS: Target date: 01/20/2024  Patient will demo at least 50% compliance with HEP to self manage symptoms.  Baseline: Goal status: INITIAL  2.  Assess FGA Baseline: 18/30 (12/26/23) Goal status: MET  3.  Assess TUG (Cognitive) Baseline: 26.25 seconds Goal status: MET   LONG TERM GOALS: Target date: 02/17/2024   Pt will decrease 5 Time Sit to Stand by at least 3 seconds in order to demonstrate clinically significant improvement in LE strength.  Baseline: 20.81 seconds (Eval) Goal  status: INITIAL  2.  Pt will decrease TUG (Cognitive) by at least (or equal to) 3 seconds in order to demonstrate decreased fall risk. Baseline: 26.25 seconds  Goal status: INITIAL  3.  Patient will demo at least 5 points of improvement on FGA to improve functional balance and reduce fall risk. Baseline: 18/30 (12/26/23) Goal status: INITIAL  4.  Pt will be independent with final HEP in order to improve strength and balance, to decrease fall risk, and improve function at home for ADL's and at work. Baseline:  Goal status: INITIAL   ASSESSMENT:  CLINICAL IMPRESSION: Patient was seen today for physical therapy treatment to address strength and balance.  Focused session on reviewing HEP (vc's required for  technique initially), LE strengthening, hip and core stability, single leg balance, and balance on compliant surface.  Pt reported blood in urine last 3 days but stated he felt good and wanted to participate in therapy.  Pt's vitals monitored during session (no concerns noted and vitals appearing stable during session) and no change in activity tolerance noted compared to previous sessions (pt has been requiring pacing/rest breaks between ex's).  Activities modified per pt tolerance and pt paced during session.  Pt reporting no adverse symptoms during session.  Pt reported he would call his PCP regarding concerns and also would talk with kidney doctor this afternoon (has scheduled video chat appointment).   Patient continues to be limited by L sided weakness, balance, and aerobic endurance impairments.  They demonstrate improvement in HEP understanding after reviewing HEP during session.  They would continue to benefit from skilled PT to address impairments as noted and progress towards long term goals.  OBJECTIVE IMPAIRMENTS: Abnormal gait, cardiopulmonary status limiting activity, decreased activity tolerance, decreased balance, decreased cognition, decreased coordination, decreased endurance,  decreased knowledge of condition, decreased knowledge of use of DME, decreased mobility, difficulty walking, decreased strength, decreased safety awareness, increased fascial restrictions, impaired flexibility, impaired sensation, impaired tone, impaired UE functional use, impaired vision/preception, improper body mechanics, and pain.   ACTIVITY LIMITATIONS: carrying, lifting, bending, standing, squatting, sleeping, stairs, transfers, locomotion level, and caring for others  PARTICIPATION LIMITATIONS: meal prep, cleaning, laundry, medication management, driving, shopping, community activity, occupation, and yard work  PERSONAL FACTORS: Age, Time since onset of injury/illness/exacerbation, and 1-2 comorbidities: CHF, hx of CVA, ESRD are also affecting patient's functional outcome.   REHAB POTENTIAL: Good  CLINICAL DECISION MAKING: Evolving/moderate complexity  EVALUATION COMPLEXITY: Moderate  PLAN:  PT FREQUENCY: 2x/week  PT DURATION: 8 weeks  PLANNED INTERVENTIONS: [id number]- PT Re-evaluation, 97750- Physical Performance Testing, 97110-Therapeutic exercises, 97530- Therapeutic activity, 97112- Neuromuscular re-education, 97535- Self Care, [id number]- Manual therapy, [phone]- Gait training, [phone]- Orthotic/Prosthetic subsequent, [phone]- Aquatic Therapy, [phone]- Electrical stimulation (unattended), Patient/Family education, Balance training, Stair training, Taping, Joint mobilization, Spinal mobilization, Cognitive remediation, DME instructions, Wheelchair mobility training, Cryotherapy, and Moist heat  PLAN FOR NEXT SESSION: SLP referral (did pt call PCP to request)?; higher level balance with cognitive tasks/dual tasks; BlazePods; challenge awareness of L side, L hip abd strengthening, quadruped, half kneel, turning   [employer], PT 01/16/2024, 1:16 PM

## 2024-01-16 NOTE — Patient Instructions (Signed)
 SABRA

## 2024-01-20 ENCOUNTER — Ambulatory Visit

## 2024-01-20 ENCOUNTER — Encounter: Payer: Self-pay | Admitting: Physical Therapy

## 2024-01-20 ENCOUNTER — Ambulatory Visit: Admitting: Physical Therapy

## 2024-01-20 VITALS — BP 151/91 | HR 76

## 2024-01-20 DIAGNOSIS — R278 Other lack of coordination: Secondary | ICD-10-CM

## 2024-01-20 DIAGNOSIS — I69854 Hemiplegia and hemiparesis following other cerebrovascular disease affecting left non-dominant side: Secondary | ICD-10-CM

## 2024-01-20 DIAGNOSIS — M6281 Muscle weakness (generalized): Secondary | ICD-10-CM

## 2024-01-20 DIAGNOSIS — R29898 Other symptoms and signs involving the musculoskeletal system: Secondary | ICD-10-CM

## 2024-01-20 DIAGNOSIS — R2689 Other abnormalities of gait and mobility: Secondary | ICD-10-CM

## 2024-01-20 NOTE — Therapy (Signed)
 OUTPATIENT OCCUPATIONAL THERAPY NEURO TREATMENT  Patient Name: Nathan Campbell MRN: [id number] DOB:1975/08/07, 48 y.o., male Today's Date: 01/20/2024  PCP: None REFERRING PROVIDER: Sebastian Jerilyn HERO, FNP  END OF SESSION:  OT End of Session - 01/20/24 0938     Visit Number 7    Number of Visits 16    Date for Recertification  02/22/24    Authorization Type [employer] [facility]    Authorization Time Period 12/30/23-02/24/24    Authorization - Visit Number 6    Authorization - Number of Visits 17    Progress Note Due on Visit 10    OT Start Time 0838    OT Stop Time 0931    OT Time Calculation (min) 53 min    Equipment Utilized During Treatment estim, mat table    Activity Tolerance Patient tolerated treatment well    Behavior During Therapy WFL for tasks assessed/performed          Past Medical History:  Diagnosis Date   Acute combined systolic and diastolic CHF, NYHA class 4 (HCC) 06/10/2016   Anemia    Cardiomyopathy (HCC) 06/14/2016   CHF (congestive heart failure) (HCC)    COVID 10/2020   Dyspnea    ESRD on hemodialysis (HCC)    TTS at [facility]   Hypertension    Hypertensive heart and kidney disease with heart failure (HCC) 06/14/2016   Hypertensive heart disease with congestive heart failure (HCC)    Obesity    Past Surgical History:  Procedure Laterality Date   A/V FISTULAGRAM Left 05/03/2022   Procedure: A/V Fistulagram;  Surgeon: Eliza Lonni RAMAN, MD;  Location: [facility] INVASIVE CV LAB;  Service: Cardiovascular;  Laterality: Left;   AV FISTULA PLACEMENT Left 11/11/2020   Procedure: LEFT ARM First Stage Basilic Vein Fistula Creation;  Surgeon: Sheree Penne Lonni, MD;  Location: [facility] OR;  Service: Vascular;  Laterality: Left;   BASCILIC VEIN TRANSPOSITION Left 01/23/2021   Procedure: Second Stage Basilic Vein Transposition of Left Arm Arteriovenous  Fistula;  Surgeon: Sheree Penne Lonni, MD;  Location: [facility] OR;  Service: Vascular;  Laterality: Left;  Block   to LMA    COLONOSCOPY     FISTULA SUPERFICIALIZATION Left 05/07/2022   Procedure: PLICATION OF ANEURYSM, LEFT ARM FISTULA;  Surgeon: Eliza Lonni RAMAN, MD;  Location: [facility] OR;  Service: Vascular;  Laterality: Left;   IR CT HEAD LTD  10/07/2023   IR CT HEAD LTD  10/07/2023   IR PATIENT EVAL TECH 0-60 MINS  10/07/2023   IR PERCUTANEOUS ART THROMBECTOMY/INFUSION INTRACRANIAL INC DIAG ANGIO  10/07/2023   NO PAST SURGERIES     PERIPHERAL VASCULAR BALLOON ANGIOPLASTY Left 05/03/2022   Procedure: PERIPHERAL VASCULAR BALLOON ANGIOPLASTY;  Surgeon: Eliza Lonni RAMAN, MD;  Location: [facility] INVASIVE CV LAB;  Service: Cardiovascular;  Laterality: Left;  AVF   RADIOLOGY WITH ANESTHESIA N/A 10/07/2023   Procedure: RADIOLOGY WITH ANESTHESIA;  Surgeon: Radiologist, Medication, MD;  Location: [facility] OR;  Service: Radiology;  Laterality: N/A;   Patient Active Problem List   Diagnosis Date Noted   Stroke (HCC) 11/04/2023   Cognitive and neurobehavioral dysfunction 10/24/2023   Acute hypoxic respiratory failure (HCC) 10/17/2023   Essential hypertension 10/17/2023   Dysphagia 10/17/2023   Leukocytosis 10/17/2023   Obesity 10/17/2023   Acute ischemic stroke (HCC) 10/07/2023   Middle cerebral artery embolism, right 10/07/2023   ESRD (end stage renal disease) (HCC) 03/26/2022   Hyperlipidemia, unspecified 03/20/2021   Acute gout due to renal impairment involving left wrist 04/24/2019  Malignant hypertension 10/14/2016   Combined congestive systolic and diastolic heart failure (HCC) 06/14/2016   NICM (nonischemic cardiomyopathy) (HCC) 06/14/2016   Renal failure    Hypertensive urgency 06/10/2016   CKD (chronic kidney disease), stage IV (HCC) 06/10/2016   Normocytic anemia 06/10/2016    ONSET DATE: 12/19/2023 referral date Admit date: 10/18/2023 Discharge date: 11/04/2023  REFERRING DIAG: G81.90 (ICD-10-CM) - Hemiplegia, unspecified affecting unspecified side  THERAPY DIAG:  Hemiplegia and hemiparesis  following other cerebrovascular disease affecting left non-dominant side (HCC)  Muscle weakness (generalized)  Other lack of coordination  Other symptoms and signs involving the musculoskeletal system  Rationale for Evaluation and Treatment: Rehabilitation  SUBJECTIVE:   SUBJECTIVE STATEMENT: Pt reports he has been looking into getting Saebo glove. He reports being able to play his video games I do struggle a little bit but I can tell improvement in my left hand.   Pt accompanied by: self  PERTINENT HISTORY: Pt is a 48 yo male presenting to this clinic with hemiplegia s/p R MCA embolism. Pt was hospitalized for this from 10/07/23-10/18/23, then discharged to neurology and received IP PT/OT/SLP 10/18/23-11/04/23. Initial CT head revealed right MCA territory ischemic changes with calcific density in proximal right M1 concerning for calcific embolus.   CHF, CM, ESRD on HD TTS, HTN, obesity, HLD  PRECAUTIONS: Fall, L sided weakness, blood thinners  WEIGHT BEARING RESTRICTIONS: No  PAIN:  Are you having pain? No  FALLS: Has patient fallen in last 6 months? No  LIVING ENVIRONMENT: Lives with: lives with their family sister and her 2 sons. Pt reports will be getting own place soon. Lives in: House/apartment townhouse, 2 level Stairs: Yes: Internal: 3 steps; on right going up Has following equipment at home: None  PLOF: Independent and Vocation/Vocational requirements: Worked in distribution in [employer]   PATIENT GOALS: Get back movement in my left arm, return to work, and be able to play video games like I was before.  OBJECTIVE:  Note: Objective measures were completed at Evaluation unless otherwise noted.  HAND DOMINANCE: Right  ADLs: Overall ADLs: Independent Transfers/ambulation related to ADLs: Independent Eating: Independent Grooming: Independent UB Dressing: Independent LB Dressing: Independent Toileting: Independent Bathing: Independent Tub Shower transfers:  Independent, shower/tub combo Equipment: none  IADLs: Shopping: Independent Light housekeeping: Mom is doing pharmacologist.  Meal Prep: Needs some help with opening cans and jars d/t weakness in L hand Community mobility: Mother driving pt to appts  Medication management: Mother helping with medication mgmt Financial management: Independent Handwriting: did not assess d/t pt report of no changes and pt dominant hand unaffected.   MOBILITY STATUS: Independent  POSTURE COMMENTS:  No Significant postural limitations Sitting balance: WFL  ACTIVITY TOLERANCE: Activity tolerance: No changes  FUNCTIONAL OUTCOME MEASURES: Quick Dash: 47.7/100: 47.7% impairment  UPPER EXTREMITY ROM:  R WNL  Active ROM Right eval Left eval  Shoulder flexion  92  Shoulder abduction  70  Wrist flexion  10  Wrist extension  40  Wrist pronation    Wrist supination  Impaired (re-assess)  (Blank rows = not tested)  UPPER EXTREMITY MMT:   WFL RUE, 3-/5 grossly for LUE  HAND FUNCTION: Grip strength: Right: 61.2 lbs; Left: 15.8 lbs 01/20/24: Left: 19.4 lbs  COORDINATION: 9 Hole Peg test: Right: 41.60 sec; Left: unable to pick up a peg in 2 minutes sec Box and Blocks:  Right 36 blocks, Left 11blocks 01/20/24: Left: 15 blocks   SENSATION: WFL  EDEMA: none  MUSCLE TONE: LUE: Rigidity  COGNITION: Overall cognitive status: Within functional limits for tasks assessed  VISION: Subjective report: no concerns Baseline vision: No visual deficits Visual history: no hx  VISION ASSESSMENT: WFL To be further assessed in functional context  Patient has difficulty with following activities due to following visual impairments: none  PERCEPTION: WFL  PRAXIS: Impaired: Ideomotor  OBSERVATIONS: Weakness in L hand and limited ROM in LUE affecting ability to complete ADL/IADL.                                                                                                                             TREATMENT: 01/20/24 Re-assessed 9HPT, grip strength, ROM, and box and blocks. Added goals to address coordination further as well as grip strength, and updated goals. See below for further information. Informed pt of progress.  Pt reports some difficulty with opening jars and cans with L hand at this time. Educated in automatic can/jar opener on [employer] to assist with this task while grip strength and coordination is improving.    - Neuro re-education completed for duration as noted below including:  OT educated patient on use of NMES unit as well as e-stim to encourage improved nerve function as needed to produce more active LUE ROM and increase functional use through muscular re-education. Therapist educated patient on application for L shoulder flexion and applied electrodes accordingly. Therapist utilized e-stim Russian waveform with 10/10 cycle time, 50% duty cycle, burst frequency of 50 bps, and ramp of 2 seconds. Patient tolerating up to 25. intensity with supervised application to promote improved AROM and pain management. Therapist cued patient to complete shoulder flexion activities during the NMES "on" cycle and shoulder extension during the "off" cycle to replicate functional reach and placement of the affected LUE. Functional activities incorporated included reaching for blocks at varying angles, grasping them for 10 seconds, and then releasing them into a container placed on his left side.   Finally completed supine shoulder flexion with foam roll, educated to attempt at home with shoe box or paper towel roll to allow palms to face inward for neutral scapular positioning. Pt next completed wall pushups and WB on table rocking forwards and backwards, educated in importance of WB to promote neuromuscular reeducation and proprioceptive input.   PATIENT EDUCATION: Education details: immunologist vs E-stim Person educated: Patient Education method: Explanation, Demonstration,  Tactile cues, Verbal cues, and Handouts Education comprehension: verbalized understanding, returned demonstration, verbal cues required, and needs further education  HOME EXERCISE PROGRAM: 12/30/23: Shoulder, Forearm/wrist, and hand HEP  01/02/24: Theraputty HEP with red putty (ACCESS CODE [email or web address]) 01/11/24: Shoulder shrug exercises (same access code as above) 01/16/24: E-stim and saebo glove [employer] 01/20/24: Jar/can opener [employer]  GOALS: Goals reviewed with patient? Yes  SHORT TERM GOALS: Target date: 01/23/24  Pt will be independent with LUE ROM, coordination, and grip strengthening HEP Baseline: New to OP OT Goal status: IN PROGRESS  2.  Pt will increase L shoulder abduction  to at least 90 degrees for improved functional use  Baseline: 70 degrees  01/20/24: 90 degrees  Goal status:GOAL MET   3.  Pt will be independent with sleeping strategies for hemiparesis and hemiplegia of LUE Baseline: New to OP OT Goal status: IN PROGRESS  4.  Pt will be independent with resting hand splint wear and care for night use and to improve functional use in L hand Baseline: New to OP OT Goal status: IN PROGRESS 5. Pt will be able to place at least 25 blocks using left hand with completion of Box and Blocks test.   Baseline: 15 blocks  Goal status: NEW    LONG TERM GOALS: Target date: 02/22/24  Pt will decrease QuickDASH score to no more than 25/100 to demonstrate improved function in daily tasks Baseline: 47.5/100 Goal status: IN PROGRESS  2.  Pt will increase L shoulder flexion ROM to at least 120 degrees for improved functional use Baseline: 92 degrees 01/20/24: 92 degrees  Goal status:IN PROGRESS  3.  Pt will increase L shoulder abduction ROM to at least 100 degrees for improved functional use Baseline: 70 degrees 01/20/24:  90 degrees  Goal status: IN PROGRESS  4.  Pt will increase L wrist flexion and extension by at least 40 degrees and 20 degrees,  respectively. Baseline: 10 degrees wrist flexion, 40 degrees wrist extension 01/20/24: 32 degrees wrist flexion, 63 degrees wrist extension Goal status: IN PROGRESS 5. Patient will demonstrate at least 40 lbs L grip strength as needed to open jars and other containers.   Baseline: 19.2  Goal status: NEW  ASSESSMENT:  CLINICAL IMPRESSION: Patient presents with L UE pain and weakness secondary to CVA. He demonstrates good rehab potential, as evidenced by progress made in goals and measurements, meeting 1 STG this date, and good participation in estim and activities honing LUE ROM and FM coordination. He will continue to benefit from skilled outpatient OT to improve functional use of his L UE for ADLs and IADLs.   PERFORMANCE DEFICITS: in functional skills including ADLs, IADLs, coordination, dexterity, tone, ROM, strength, pain, Fine motor control, body mechanics, cardiopulmonary status limiting function, and UE functional use, cognitive skills including attention, safety awareness, and sequencing, and psychosocial skills including coping strategies, interpersonal interactions, and routines and behaviors.   IMPAIRMENTS: are limiting patient from IADLs, rest and sleep, work, and leisure.   CO-MORBIDITIES: has co-morbidities such as ESRD that affects occupational performance. Patient will benefit from skilled OT to address above impairments and improve overall function.  REHAB POTENTIAL: Good   PLAN:  OT FREQUENCY: 2x/week  OT DURATION: 8 weeks  PLANNED INTERVENTIONS: 97168 OT Re-evaluation, 97535 self care/ADL training, 02889 therapeutic exercise, 97530 therapeutic activity, 97112 neuromuscular re-education, 97140 manual therapy, 97032 electrical stimulation (manual), 97760 Orthotic Initial, [id number] Orthotic/Prosthetic subsequent, passive range of motion, functional mobility training, visual/perceptual remediation/compensation, energy conservation, patient/family education, and DME and/or AE  instructions  RECOMMENDED OTHER SERVICES: none noted  CONSULTED AND AGREED WITH PLAN OF CARE: Patient  PLAN FOR NEXT SESSION:  Continue with E-stim- L shoulder, attempt L hand as needed Coordination/ strengthening activities ROM activities WB activities  [employer], OT 01/20/2024, 9:39 AM

## 2024-01-20 NOTE — Therapy (Signed)
 OUTPATIENT PHYSICAL THERAPY NEURO TREATMENT   Patient Name: Nathan Campbell MRN: [id number] DOB:December 03, 1975, 48 y.o., male Today's Date: 01/21/2024   PCP: Pt reports PCP is Blad but did not give any further identifying information. REFERRING PROVIDER: Sebastian Jerilyn HERO, FNP  END OF SESSION:  PT End of Session - 01/20/24 0935     Visit Number 9    Number of Visits 17   16 plus Eval   Date for Recertification  02/24/24   For scheduling delays   Authorization Type [facility] Medicare    Authorization Time Period 12/26/23 - 02/20/24    Authorization - Visit Number 9    Authorization - Number of Visits 17    PT Start Time [phone]   pt started late (needing to use restroom between OT and PT session)   PT Stop Time 1015    PT Time Calculation (min) 29 min    Equipment Utilized During Treatment Gait belt    Activity Tolerance Patient tolerated treatment well    Behavior During Therapy [facility] for tasks assessed/performed          Past Medical History:  Diagnosis Date   Acute combined systolic and diastolic CHF, NYHA class 4 (HCC) 06/10/2016   Anemia    Cardiomyopathy (HCC) 06/14/2016   CHF (congestive heart failure) (HCC)    COVID 10/2020   Dyspnea    ESRD on hemodialysis (HCC)    TTS at [facility]   Hypertension    Hypertensive heart and kidney disease with heart failure (HCC) 06/14/2016   Hypertensive heart disease with congestive heart failure (HCC)    Obesity    Past Surgical History:  Procedure Laterality Date   A/V FISTULAGRAM Left 05/03/2022   Procedure: A/V Fistulagram;  Surgeon: Eliza Lonni RAMAN, MD;  Location: [facility] INVASIVE CV LAB;  Service: Cardiovascular;  Laterality: Left;   AV FISTULA PLACEMENT Left 11/11/2020   Procedure: LEFT ARM First Stage Basilic Vein Fistula Creation;  Surgeon: Sheree Penne Lonni, MD;  Location: [facility] OR;  Service: Vascular;  Laterality: Left;   BASCILIC VEIN TRANSPOSITION Left 01/23/2021   Procedure: Second Stage Basilic Vein  Transposition of Left Arm Arteriovenous  Fistula;  Surgeon: Sheree Penne Lonni, MD;  Location: [facility] OR;  Service: Vascular;  Laterality: Left;  Block  to LMA    COLONOSCOPY     FISTULA SUPERFICIALIZATION Left 05/07/2022   Procedure: PLICATION OF ANEURYSM, LEFT ARM FISTULA;  Surgeon: Eliza Lonni RAMAN, MD;  Location: [facility] OR;  Service: Vascular;  Laterality: Left;   IR CT HEAD LTD  10/07/2023   IR CT HEAD LTD  10/07/2023   IR PATIENT EVAL TECH 0-60 MINS  10/07/2023   IR PERCUTANEOUS ART THROMBECTOMY/INFUSION INTRACRANIAL INC DIAG ANGIO  10/07/2023   NO PAST SURGERIES     PERIPHERAL VASCULAR BALLOON ANGIOPLASTY Left 05/03/2022   Procedure: PERIPHERAL VASCULAR BALLOON ANGIOPLASTY;  Surgeon: Eliza Lonni RAMAN, MD;  Location: [facility] INVASIVE CV LAB;  Service: Cardiovascular;  Laterality: Left;  AVF   RADIOLOGY WITH ANESTHESIA N/A 10/07/2023   Procedure: RADIOLOGY WITH ANESTHESIA;  Surgeon: Radiologist, Medication, MD;  Location: [facility] OR;  Service: Radiology;  Laterality: N/A;   Patient Active Problem List   Diagnosis Date Noted   Stroke (HCC) 11/04/2023   Cognitive and neurobehavioral dysfunction 10/24/2023   Acute hypoxic respiratory failure (HCC) 10/17/2023   Essential hypertension 10/17/2023   Dysphagia 10/17/2023   Leukocytosis 10/17/2023   Obesity 10/17/2023   Acute ischemic stroke (HCC) 10/07/2023   Middle cerebral artery embolism, right  10/07/2023   ESRD (end stage renal disease) (HCC) 03/26/2022   Hyperlipidemia, unspecified 03/20/2021   Acute gout due to renal impairment involving left wrist 04/24/2019   Malignant hypertension 10/14/2016   Combined congestive systolic and diastolic heart failure (HCC) 06/14/2016   NICM (nonischemic cardiomyopathy) (HCC) 06/14/2016   Renal failure    Hypertensive urgency 06/10/2016   CKD (chronic kidney disease), stage IV (HCC) 06/10/2016   Normocytic anemia 06/10/2016    ONSET DATE: 12/14/2023- date of referral  REFERRING DIAG: G81.90  (ICD-10-CM) - Hemiparesis (HCC)  THERAPY DIAG:  Hemiplegia and hemiparesis following other cerebrovascular disease affecting left non-dominant side (HCC)  Muscle weakness (generalized)  Other lack of coordination  Other abnormalities of gait and mobility  Rationale for Evaluation and Treatment: Rehabilitation  SUBJECTIVE:  Likes to be called LC.                                                                                                                                                                                            SUBJECTIVE STATEMENT: Pt reports no recent falls and no pain.  Has urology appointment today.  Still having blood in urine; has in person appointment with urology today (couldn't do phone appointment earlier this week d/t needing to be seen in person for blood in urine).  Pt reports feeling good and requesting to participate in therapy. Pt accompanied by: self.  Mom drove pt.  PERTINENT HISTORY: Hospitalization 10/07/23 for acute onset of left-sided weakness, left side neglect, dysarthria and right gaze preference; imaging showing acute right MCA CVA with right M1 occlusion (intubated for airway protection; s/p TNK; s/p revascularization of R MCA with IR 7/25).  IP rehab stay 10/18/23-11/04/23. Urgent care visit 12/02/23 for gout (L foot).  PMH includes: gout, CKD, ESRD on HD T/R/Sat, CHF, anemia, htn, stroke 10/07/23, acute hypoxic respiratory failure, dysphagia, cardiomyopathy, and obesity (BMI 41).  PAIN:  Are you having pain? Yes: NPRS scale: 0/10 currently Pain location: L shoulder Pain description: Aching Aggravating factors: Laying down Relieving factors: Muscle relaxer  PRECAUTIONS: Fall; fistula in LUE (dialysis T/Th/Sat)  RED FLAGS: None   WEIGHT BEARING RESTRICTIONS: No  FALLS: Has patient fallen in last 6 months? No  LIVING ENVIRONMENT: Lives with: Sister and her 2 sons; mom currently staying with pt to assist as needed Lives in: Other  Townhouse--stays on 2nd floor Stairs: Yes: Internal: 5 steps; on right going up and External: 0 steps; none Has following equipment at home: Crutches  PLOF: Independent  PATIENT GOALS: To get more function in arm; get voice back; get strength up.  OBJECTIVE:  Note: Objective measures were completed  at Evaluation unless otherwise noted.  COGNITION: Overall cognitive status: A&Ox4; possible decreased awareness of deficits  COORDINATION:  Rapid Alternating toe taps (in sitting): Decreased L LE  Heel to shin (in sitting); mild decreased L LE  LOWER EXTREMITY ROM:     Active  Right Eval Left Eval  Hip flexion WNL WNL  Hip extension    Hip abduction    Hip adduction    Hip internal rotation    Hip external rotation    Knee flexion WNL WNL  Knee extension WNL WNL  Ankle dorsiflexion WNL WNL  Ankle plantarflexion WNL WNL  Ankle inversion    Ankle eversion     (Blank rows = not tested)  LOWER EXTREMITY MMT:    MMT Right Eval Left Eval  Hip flexion 5/5 4+/5  Hip extension    Hip abduction    Hip adduction    Hip internal rotation    Hip external rotation    Knee flexion 5/5 4+/5  Knee extension 5/5 5/5  Ankle dorsiflexion 5/5 4+/5  Ankle plantarflexion At least 3/5 AROM At least 3/5 AROM  Ankle inversion    Ankle eversion    (Blank rows = not tested) GAIT: Findings: Gait Characteristics: step through pattern, decreased arm swing- Left, decreased ankle dorsiflexion- Left, and wide BOS, Distance walked: clinic distances, Assistive device utilized:None, Level of assistance: SBA, and Comments: pt tending to walk close to items on L side but did not bump into anything  FUNCTIONAL TESTS:  5 times sit to stand: 20.81 seconds 10 meter walk test: 1.25 m/sec (7.97 seconds); no AD use Functional gait assessment: 18/30 (12/26/23) SLS: R LE 1st 2 trials about 1 second each but on 3rd trial 9.0 seconds; L LE 1st 2 trials about 1 second each but on 3rd trial 30 seconds L LE (time  stopped by therapist at 30 seconds) TUG (Manual): 9.41 seconds 12/26/23 TUG (Cognitive): 26.25 seconds 12/26/23  FUNCTIONAL GAIT ASSESSMENT  Date: 12/26/23 Score  GAIT LEVEL SURFACE Instructions: Walk at your normal speed from here to the next mark (6 m) [20 ft]. (1) Moderate impairment-Walks 6 m (20 ft), slow speed, abnormal gait pattern, evidence for imbalance, or deviates 25.4 - 38.1 cm (10 -15 in) outside of the 30.48-cm (12-in) walkway width. Requires more than 7 seconds to ambulate 6 m (20 ft). 8.53 seconds  2.   CHANGE IN GAIT SPEED Instructions: Begin walking at your normal pace (for 1.5 m [5 ft]). When I tell you "go," walk as fast as you can (for 1.5 m [5 ft]). When I tell you "slow," walk as slowly as you can (for 1.5 m [5 ft]. (2) Mild impairment - Is able to change speed but demonstrates mild gait deviations, deviates 15.24 -25.4 cm (6 -10 in) outside of the 30.48-cm (12-in) walkway width, or no gait deviations but unable to achieve a significant change in velocity, or uses an assistive device  3.    GAIT WITH HORIZONTAL HEAD TURNS Instructions: Walk from here to the next mark 6 m (20 ft) away. Begin walking at your normal pace. Keep walking straight; after 3 steps, turn your head to the right and keep walking straight while looking to the right. After 3 more steps, turn your head to the left and keep walking straight while looking left. Continue alternating looking right and left. (2) Mild impairment - Performs head turns smoothly with slight change in gait velocity (eg, minor disruption to smooth gait path), deviates 15.24 -25.4 cm (  6 -10 in) outside 30.48-cm (12-in) walkway width, or uses an assistive device.  4.   GAIT WITH VERTICAL HEAD TURNS Instructions: Walk from here to the next mark (6 m [20 ft]). Begin walking at your normal pace. Keep walking straight; after 3 steps, tip your head up and keep walking straight while looking up. After 3 more steps, tip your head down, keep walking  straight while looking down. Continue  alternating looking up and down every 3 steps until you have completed 2 repetitions in each direction. (2) Mild impairment - Performs task with slight change in gait velocity (eg, minor disruption to smooth gait path), deviates 15.24 -25.4 cm (6 -10 in) outside 30.48-cm (12-in) walkway width or uses assistive device.  5.  GAIT AND PIVOT TURN Instructions: Begin with walking at your normal pace. When I tell you, "turn and stop," turn as quickly as you can to face the opposite direction and stop. (2) Mild impairment - Pivot turns safely in 3 seconds and stops with no loss of balance, or pivot turns safely within 3 seconds and stops with mild imbalance, requires small steps to catch balance  6.   STEP OVER OBSTACLE Instructions: Begin walking at your normal speed. When you come to the shoe box, step over it, not around it, and keep walking. (2) Mild impairment - Is able to step over one shoe box (11.43 cm [4.5 in] total height) without changing gait speed; no evidence of imbalance.  7.   GAIT WITH NARROW BASE OF SUPPORT Instructions: Walk on the floor with arms folded across the chest, feet aligned heel to toe in tandem for a distance of 3.6 m [12 ft]. The number of steps taken in a straight line are counted for a maximum of 10 steps. (2) Mild impairment - Ambulates 7-9 steps  8.   GAIT WITH EYES CLOSED Instructions: Walk at your normal speed from here to the next mark (6 m [20 ft]) with your eyes closed. (2) Mild impairment - Walks 6 m (20 ft), uses assistive device, slower speed, mild gait deviations, deviates 15.24 -25.4 cm (6 -10 in) outside 30.48-cm (12-in) walkway width. Ambulates 6 m (20 ft) in less than 9 seconds but greater than 7 seconds 7.72 seconds  9.   AMBULATING BACKWARDS Instructions: Walk backwards until I tell you to stop (2) Mild impairment - Walks 6 m (20 ft), uses assistive device, slower speed, mild gait deviations, deviates 15.24 -25.4 cm (6 -10  in) outside 30.48-cm (12-in) walkway width12.06 seconds  10. STEPS Instructions: Walk up these stairs as you would at home (ie, using the rail if necessary). At the top turn around and walk down. (1) Moderate impairment-Two feet to a stair; must use rail.  Total 18/30   Interpretation of scores: Non-Specific Older Adults Cutoff Score: <=22/30 = risk of falls Parkinson's Disease Cutoff score <15/30= fall risk (Hoehn & Yahr 1-4)  Minimally Clinically Important Difference (MCID)  Stroke (acute, subacute, and chronic) = MDC: 4.2 points Vestibular (acute) = MDC: 6 points Community Dwelling Older Adults =  MCID: 4 points Parkinson's Disease  =  MDC: 4.3 points  (Academy of Neurologic Physical Therapy (nd). Functional Gait Assessment. Retrieved from https://www.neuropt.org/docs/default-source/cpgs/core-outcome-measures/function-gait-assessment-pocket-guide-proof9-(2).pdf?sfvrsn=b11f35043_0.)   PATIENT SURVEYS:  Stroke Impact Scale : TBA  TREATMENT DATE: 01/20/2024  Self Care: BP and HR taken in sitting at rest beginning of session (see below for details).  Pt reports no dizziness or lightheadedness; asymptomatic.  Pt reports feeling good and wanting to participate in therapy. Vitals:   01/20/24 0952  BP: (!) 151/91  Pulse: 76  Capillary refill test 2 seconds (checked fingers on both hands) beginning of session. End of session vitals: BP 139/94 and HR 79 bpm  Therapeutic Activity: SLS with opposite LE toe taps to Gonge targets (forward then lateral then retro =1 rep): x5 reps B LE's (alternating LE's); x10 reps R LE; x10 reps L LE.  Notes: increased difficulty and coordination with L LE; 7/10 RPE post activity Lateral step ups (keeping foot on 6 inch step): 10 reps x2 sets R LE; 10 reps x2 sets L LE; progressed from UE support on ballet bar 1st trial (CGA) and no UE  support 2nd trial (CGA to min assist for balance)  PATIENT EDUCATION: Education details: Follow-up with PCP regarding SLP referral. Person educated: Patient Education method: Explanation, Demonstration, and Verbal cues Education comprehension: verbalized understanding  HOME EXERCISE PROGRAM: 01/13/24 Access Code: [email or web address] URL: [email or web address] Date: 01/13/2024 Prepared by: Damien Caulk  Exercises - Standing March with Counter Support  - 1 x daily - 5 x weekly - 2 sets - 10 reps - Standing Hip Abduction with Counter Support  - 1 x daily - 5 x weekly - 2 sets - 10 reps - Standing Single Leg Stance with Counter Support  - 1 x daily - 5 x weekly - 2 sets - 1 reps - 30 second hold - Supine Bridge  - 1 x daily - 5 x weekly - 2 sets - 10 reps - Clamshell  - 1 x daily - 5 x weekly - 2 sets - 10 reps  GOALS: Goals reviewed with patient? Yes  SHORT TERM GOALS: Target date: 01/20/2024 (assessed 01/20/24)  Patient will demo at least 50% compliance with HEP to self manage symptoms.  Baseline: Goal status: MET (pt reports 50% compliance)  2.  Assess FGA Baseline: 18/30 (12/26/23) Goal status: MET  3.  Assess TUG (Cognitive) Baseline: 26.25 seconds Goal status: MET   LONG TERM GOALS: Target date: 02/17/2024   Pt will decrease 5 Time Sit to Stand by at least 3 seconds in order to demonstrate clinically significant improvement in LE strength.  Baseline: 20.81 seconds (Eval) Goal status: INITIAL  2.  Pt will decrease TUG (Cognitive) by at least (or equal to) 3 seconds in order to demonstrate decreased fall risk. Baseline: 26.25 seconds  Goal status: INITIAL  3.  Patient will demo at least 5 points of improvement on FGA to improve functional balance and reduce fall risk. Baseline: 18/30 (12/26/23) Goal status: INITIAL  4.  Pt will be independent with final HEP in order to improve strength and balance, to decrease fall risk, and improve function at home for ADL's  and at work. Baseline:  Goal status: INITIAL   ASSESSMENT:  CLINICAL IMPRESSION: Patient was seen today for physical therapy treatment to address STG's and balance.  Focused session on assessing STG's and then performing single leg dynamic balance activities.  Pt reports being 50% compliant with HEP (goal met); FGA and TUG cognitive goal also met (previously assessed for baseline measure for LTG's).  Pt has in person appointment today to follow up with urology to address concerns.  Pt's vitals monitored during session; no adverse symptoms reported during session and pt stating he  felt good and wanted to participate in therapy.  Patient continues to be limited by L sided weakness, balance, and aerobic endurance impairments.  They demonstrate improvement in balance with repetition of single leg balance activities.  They would continue to benefit from skilled PT to address impairments as noted and progress towards long term goals.  OBJECTIVE IMPAIRMENTS: Abnormal gait, cardiopulmonary status limiting activity, decreased activity tolerance, decreased balance, decreased cognition, decreased coordination, decreased endurance, decreased knowledge of condition, decreased knowledge of use of DME, decreased mobility, difficulty walking, decreased strength, decreased safety awareness, increased fascial restrictions, impaired flexibility, impaired sensation, impaired tone, impaired UE functional use, impaired vision/preception, improper body mechanics, and pain.   ACTIVITY LIMITATIONS: carrying, lifting, bending, standing, squatting, sleeping, stairs, transfers, locomotion level, and caring for others  PARTICIPATION LIMITATIONS: meal prep, cleaning, laundry, medication management, driving, shopping, community activity, occupation, and yard work  PERSONAL FACTORS: Age, Time since onset of injury/illness/exacerbation, and 1-2 comorbidities: CHF, hx of CVA, ESRD are also affecting patient's functional outcome.    REHAB POTENTIAL: Good  CLINICAL DECISION MAKING: Evolving/moderate complexity  EVALUATION COMPLEXITY: Moderate  PLAN:  PT FREQUENCY: 2x/week  PT DURATION: 8 weeks  PLANNED INTERVENTIONS: [id number]- PT Re-evaluation, 97750- Physical Performance Testing, 97110-Therapeutic exercises, 97530- Therapeutic activity, 97112- Neuromuscular re-education, 97535- Self Care, [id number]- Manual therapy, [phone]- Gait training, [phone]- Orthotic/Prosthetic subsequent, [phone]- Aquatic Therapy, [phone]- Electrical stimulation (unattended), Patient/Family education, Balance training, Stair training, Taping, Joint mobilization, Spinal mobilization, Cognitive remediation, DME instructions, Wheelchair mobility training, Cryotherapy, and Moist heat  PLAN FOR NEXT SESSION: 10 visit progress note due; SLP referral (did pt call PCP to request)?; higher level balance with cognitive tasks/dual tasks; [employer]; challenge awareness of L side, L hip abd strengthening, quadruped, half kneel, turning   [employer], PT 01/21/2024, 7:43 AM

## 2024-01-21 NOTE — Progress Notes (Signed)
 [facility]   Department of Urology  Chief Complaint:  1. Left renal mass   2. ESRD on hemodialysis (HCC)      01/20/2023 Patient presents for follow-up.  Accompanied by his mother.  I reviewed the MRI with him which showed that the left renal mass is suspicious for malignancy.  He also states that he has developed hematuria since last hemodialysis but it was just a one-time   HPI History of Present Illness The patient presents for evaluation of a left renal cyst. He is accompanied by his mother.  The patient has been undergoing hemodialysis for the past 3 years. He was removed from the kidney transplant list following a cerebrovascular accident (CVA) on 10/06/2023. It was recommended that he undergo magnetic resonance imaging (MRI) prior to reconsideration for the transplant list. The CVA resulted in paresthesia of the arm, without involvement of the leg. He is currently participating in physical therapy, with 8 weeks remaining in the program. Post-stroke, he has experienced dysarthria and is receiving speech, occupational, and physical therapy twice weekly. The etiology of the stroke remains undetermined.  The patient has no history of diabetes mellitus or abdominal surgeries. He retains his gallbladder and appendix. He has a healthy daughter and is currently single. 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  SOCIAL HISTORY Does not smoke. On medical leave from sanitation job.  FAMILY HISTORY He reports no family history of cancer.  Anticoagulation:  Portions of the patient's history reviewed as below: Medical History[1]  Surgical History[2]  Allergies[3]    Current Medications[4]  Family History[5]  Social History[6]  Pertinent Labs:   Results for orders placed or performed in visit on 11/07/23  HLA PRE-TRANSPLANT ANTIBODY SCREEN (IN-HOUSE)   Collection Time: 11/07/23 11:06 AM  Result Value Ref Range   Cumulative  Antibodies None    Phenotype      A34 A68 B53 B- Bw4 Bw- Cw4 Cw- DR15 DR17 DR51 DR52 DQA1*01:02 DQA1*05:01 DQ2 DQ6 DPA1*02:02 DPA1*03:05Q DPB1*01:01P DPB1*40:01   Sample Draw date 2023-11-07    Class I Antibody Specificities Negative    Class II Antibody Specificities Negative    Disclaimer (FDA)      HLA typing is performed by intermediate to high resolution SSP or NGS.   Crossmatches are performed by flow cytometry.  All procedures and reagents have been validated and performance characteristics determined by the HLA/Immunogenetics Laboratory.   Certain of these tests have not been cleared/approved by the U.S. Food and Drug Administration.  The FDA has determined that such approval is not necessary because this laboratory is certified under the Clinical Laboratory Improvement Amendments to  perform high complexity testing.  Additional information may be obtained by contacting the laboratory.   Disclaimer Regulatory Affairs Officer)      THIS LABORATORY IS CERTIFIED UNDER THE CLINICAL LABORATORY IMPROVEMENT AMENDMENTS OF 1988 (CLIA) AS QUALIFIED TO PERFORM HIGH COMPLEXITY CLINICAL TESTING. Reviewed and approved by: Michael D. Lamonte, Ph.D., F.ACHI Director, HLA/Immunogenetics and  Immunodiagnostics Laboratory.   Screening Test date 2023-11-18     No results found for this or any previous visit (from the past 24 hours).  No components found for: PSAG     Pertinent Imaging:  CT Outside Images Non Result Result Date: 01/10/2024 These images were imported for review and or comparison.  CT Outside Images Non Result Result Date: 01/10/2024 These images were imported for review and or comparison.  CT Outside Images Non Result Result Date:  01/10/2024 These images were imported for review and or comparison.  CT Outside Images Non Result Result Date: 01/10/2024 These images were imported for review and or comparison.  CT Outside Images Non Result Result Date: 01/10/2024 These images  were imported for review and or comparison.  CT Outside Images Non Result Result Date: 01/10/2024 These images were imported for review and or comparison.  US  Outside Images Non Result Result Date: 01/10/2024 These images were imported for review and or comparison.  MRI Abdomen W And WO Contrast Result Date: 01/09/2024 MRI ABDOMEN W AND WO CONTRAST, 01/09/2024 11:17 AM INDICATION: Lesion on MR w/o, contrasted exam needed for further evaluation. Pre Kidney Txp Patient., Lesion on MR w/o, contrasted exam needed for further evaluation. Pre Kidney Txp Patient., Encounter for other preprocedural examination \ Z01.818 Encounter for other preprocedural examination \ N18.6 End stage renal disease \ N18.6 End stage renal disease \ Z99.2 Dependence on renal dialysis Lesion on MR w/o COMPARISON: MR abdomen 06/27/2023, CT 01/18/2022. TECHNIQUE: Multi-planar, multi-sequence MR images of the abdomen were obtained prior to and after intravenous administration of gadolinium-based contrast. FINDINGS: . Lower Chest: Within normal limits. .  Liver: No suspicious hepatic lesions. Hepatic steatosis. Signal changes related to iron  deposition. .  Gallbladder/Biliary: Within normal limits. .  Spleen: Signal changes related to iron  deposition. Otherwise unremarkable. .  Pancreas: Within normal limits. .  Adrenals: Similar left adrenal myelolipoma measuring up to 2 cm. .  Kidneys: Atrophic native kidneys with numerous small cysts. Redemonstrated left upper pole cystic lesion measuring 3.5 x 3.9 x 3.2 cm. This lesion is predominantly T1 hyperintense and T2 hypointense, with speckled areas of T2 signal hyperintensity. There are areas of thin wispy enhancement within this lesion, however sensitivity for underlying enhancement is reduced given the degree of intrinsic T1 signal hyperintensity. The inner margin of this lesion roughly abuts the renal sinus. .  Peritoneum/Mesenteries/Extraperitoneum: No significant ascites or  lymphadenopathy. .  Gastrointestinal tract: No evidence of obstruction. Colonic diverticulosis. .  Vascular: Within normal limits. .  Musculoskeletal: Iron  deposition is noted throughout the bone marrow.   1.  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 2.  Iron  deposition throughout the reticuloendothelial system. 3.  Ancillary findings as above.   US  RENAL Result Date: 01/04/2024 CLINICAL DATA:  Urinary urgency EXAM: RENAL / URINARY TRACT ULTRASOUND COMPLETE COMPARISON:  Renal ultrasound June 10, 2016 FINDINGS: Right Kidney: Renal measurements: 11.6 x 6 x 4.7 cm = volume: 169 mL. Echogenicity is increased. Few cortical cysts measuring 1.7 and 2.2 cm in interpolar region. No solid mass or hydronephrosis visualized. Left Kidney: Renal measurements: 10.1 x 5 x 5.5 cm = volume: 143 mL. Echogenicity is increased. Cortical cysts measuring 5.5 and 2.6 cm in upper and interpolar region respectively. No solid mass or hydronephrosis visualized. Bladder: Not visualized due to inadequate distention. Other: None. IMPRESSION: Increased parenchymal renal echogenicity which can be seen the setting of medical renal disease. No hydronephrosis. Multiple bilateral renal cortical cysts. Correlate with renal function tests. Nonvisualization of the bladder. Electronically Signed   By: Megan  Zare M.D.   On: 01/04/2024 19:06     ROS:10 systems reviewed and were negative other than noted in the HPI  Physical Exam:   Vitals:   01/20/24 1206  BP: (!) 145/94  Pulse: 76  Resp: 18  Temp: 98.2 F (36.8 C)  SpO2: 99%    GEN: No acute distress, alert  and oriented  , has some difficulty but overall comprehensible.  Had some drooping during part of the injury C/V: Regular rate, hemodynamically stable  PULM: Normal work of breathing, good chest rise and fall ABDOMEN: Soft, non-distended EXTREMITIES: Warm, well perfused, moving all  extremities spontaneously NEURO: Grossly in tact SKIN: Warm, dry      Assessment / Plan: Assessment & Plan  The patient has been undergoing hemodialysis for the past 3 years. He was removed from the kidney transplant list following a cerebrovascular accident (CVA) on 10/06/2023. It was recommended that he undergo magnetic resonance imaging (MRI) prior to reconsideration for the transplant list. The CVA resulted in paresthesia of the arm, without involvement of the leg .  I reviewed the MRI with him which showed that the left renal mass is suspicious for malignancy.  He also states that he has developed hematuria since last hemodialysis but it was just a one-time event.  I had a detailed conversation with the patient.  Discussed that the mass is about 3 cm and since he will will be considered for transplant I recommend that the mass be removed.  With regards to hematuria, discussed the need for cystoscopy, he refused office cystoscopy and wants that to be done under general anesthesia.  The procedure of robotic radical nephrectomy, risks and benefits were extensively discussed with the patient and his mother all questions answered    Plan Schedule rigid cystoscopy robotic assisted laparoscopic left radical nephrectomy Patient may stay on aspirin  from my standpoint Patient needs clearance from neurology [facility] [phone]) Needs PAT appointment  Electronically signed by:  Lauraine Fleming, MD 01/09/2024 2:52 PM       [1] Past Medical History: Diagnosis Date  . ESRD on hemodialysis    (CMD)   . HTN (hypertension)   . Morbid obesity with BMI of 40.0-44.9, adult (CMD)   . NICM (nonischemic cardiomyopathy)    (CMD)   . Stroke    (CMD) 10/06/2023  [2] Past Surgical History: Procedure Laterality Date  . AV FISTULA PLACEMENT     Procedure: AV FISTULA PLACEMENT  [3] No Known Allergies [4]  Current Outpatient Medications:  .  aspirin  81 mg chewable  tablet, Chew 81 mg daily., Disp: , Rfl:  .  atorvastatin  (LIPITOR) 40 mg tablet, Take 40 mg by mouth daily., Disp: , Rfl:  .  carvediloL  (COREG ) 6.25 mg tablet, Take 1 tablet by mouth., Disp: , Rfl:  .  clopidogreL  (PLAVIX ) 75 mg tablet, Take 75 mg by mouth daily., Disp: , Rfl:  .  cyclobenzaprine  (FLEXERIL ) 5 mg tablet, Take 5 mg by mouth nightly., Disp: , Rfl:  .  midodrine  (PROAMATINE ) 5 mg tablet, Take 5 mg by mouth., Disp: , Rfl:  .  ondansetron  (ZOFRAN ) 4 mg tablet, Take 4 mg by mouth every 6 (six) hours as needed for nausea or vomiting., Disp: , Rfl:  .  pantoprazole  (PROTONIX ) 40 mg EC tablet, Take 40 mg by mouth at bedtime., Disp: , Rfl:  .  sennosides-docusate sodium  (PERICOLACE) 8.6-50 mg per tablet, Take 2 tablets by mouth nightly., Disp: , Rfl:  .  witch hazel-glycerin  (TUCKS) padm pad, Apply topically as needed. apply as needed, Disp: , Rfl:  .  allopurinoL  (ZYLOPRIM ) 100 mg tablet, Take 100 mg by mouth. (Patient not taking: Reported on 01/20/2024), Disp: , Rfl:  .  amLODIPine  (NORVASC ) 10 mg tablet, Take 10 mg by mouth Once Daily. (Patient not taking: Reported on 01/20/2024), Disp: , Rfl:  .  calcium  carbonate (OS-CAL) 500 mg calcium  (1,250 mg) chewable tablet, Take  by mouth. (Patient not taking: Reported on 01/20/2024), Disp: , Rfl:  .  camphor-menthoL  0.5-0.5 % lotion, Apply topically as needed., Disp: , Rfl:  .  diclofenac  sodium (VOLTAREN ) 1 % gel, Apply  topically. (Patient not taking: Reported on 01/20/2024), Disp: , Rfl:  .  doxercalciferol  (HECTOROL  IV), Infuse 8 mcg into a venous catheter. doxercalciferol  (hectorol ), Disp: , Rfl:  .  hydrOXYzine  (ATARAX ) 10 mg tablet, Take 10 mg by mouth 3 (three) times a day as needed for itching., Disp: , Rfl:  .  isosorbide  mononitrate (IMDUR ) 60 mg 24 hr tablet, Take 60 mg by mouth Once Daily. (Patient not taking: Reported on 01/20/2024), Disp: , Rfl:  .  torsemide  (DEMADEX ) 20 mg tablet, Take 60 mg by mouth 2 (two) times a day., Disp: ,  Rfl:  [5] Family History Problem Relation Name Age of Onset  . Hypertension Mother    . Kidney disease Father    . Diabetes Father    . Hypertension Sister    . Hypertension Paternal Grandmother    . Diabetes Paternal Grandfather    [6] Social History Socioeconomic History  . Marital status: Single  Tobacco Use  . Smoking status: Former    Current packs/day: 0.00    Types: Cigarettes    Quit date: 03/15/2020    Years since quitting: 3.8  . Smokeless tobacco: Never  Substance and Sexual Activity  . Alcohol use: Never  . Drug use: Never   Social Drivers of Health   Food Insecurity: No Food Insecurity (10/27/2023)   Received from [facility]   Food vital sign   . Within the past 12 months, you worried that your food would run out before you got money to buy more: Never true   . Within the past 12 months, the food you bought just didn't last and you didn't have money to get more: Never true  Transportation Needs: No Transportation Needs (10/27/2023)   Received from [facility] - Transportation   . In the past 12 months, has lack of transportation kept you from medical appointments or from getting medications?: No   . In the past 12 months, has lack of transportation kept you from meetings, work, or from getting things needed for daily living?: No  Safety: Not At Risk (10/27/2023)   Received from [facility]   Safety   . Within the last year, have you been afraid of your partner or ex-partner?: No   . Within the last year, have you been humiliated or emotionally abused in other ways by your partner or ex-partner?: No   . Within the last year, have you been kicked, hit, slapped, or otherwise physically hurt by your partner or ex-partner?: No   . Within the last year, have you been raped or forced to have any kind of sexual activity by your partner or ex-partner?: No  Living Situation: Low Risk  (10/27/2023)   Received from [facility] Situation   . In the last 12  months, was there a time when you were not able to pay the mortgage or rent on time?: No   . In the past 12 months, how many times have you moved where you were living?: 0   . At any time in the past 12 months, were you homeless or living in a shelter (including now)?: No

## 2024-01-23 ENCOUNTER — Encounter: Payer: Self-pay | Admitting: Physical Therapy

## 2024-01-23 ENCOUNTER — Ambulatory Visit

## 2024-01-23 ENCOUNTER — Ambulatory Visit: Admitting: Physical Therapy

## 2024-01-23 VITALS — BP 138/93 | HR 82

## 2024-01-23 VITALS — BP 133/87 | HR 88

## 2024-01-23 DIAGNOSIS — R29898 Other symptoms and signs involving the musculoskeletal system: Secondary | ICD-10-CM

## 2024-01-23 DIAGNOSIS — R278 Other lack of coordination: Secondary | ICD-10-CM

## 2024-01-23 DIAGNOSIS — R2689 Other abnormalities of gait and mobility: Secondary | ICD-10-CM

## 2024-01-23 DIAGNOSIS — M6281 Muscle weakness (generalized): Secondary | ICD-10-CM

## 2024-01-23 DIAGNOSIS — I69854 Hemiplegia and hemiparesis following other cerebrovascular disease affecting left non-dominant side: Secondary | ICD-10-CM

## 2024-01-23 DIAGNOSIS — R41840 Attention and concentration deficit: Secondary | ICD-10-CM

## 2024-01-23 DIAGNOSIS — R29818 Other symptoms and signs involving the nervous system: Secondary | ICD-10-CM

## 2024-01-23 NOTE — Therapy (Signed)
 OUTPATIENT PHYSICAL THERAPY NEURO TREATMENT   10th Visit Physical Therapy Progress Note   Dates of Reporting Period: 12/23/23 to 01/23/24  See Note below for Objective Data and Assessment of Progress/Goals.  Thank you for the referral of this patient. Damien Caulk, PT   Patient Name: Nathan Campbell MRN: [id number] DOB:1975-12-02, 48 y.o., male Today's Date: 01/23/2024   PCP: Pt reports PCP is [facility] but did not give any further identifying information. REFERRING PROVIDER: Sebastian Jerilyn HERO, FNP  END OF SESSION:  PT End of Session - 01/23/24 0845     Visit Number 10    Number of Visits 17   16 plus Eval   Date for Recertification  02/24/24   For scheduling delays   Authorization Type [facility] Medicare    Authorization Time Period 12/26/23 - 02/20/24    Authorization - Visit Number 10    Authorization - Number of Visits 17    Progress Note Due on Visit 10    PT Start Time 0840    PT Stop Time 0928    PT Time Calculation (min) 48 min    Equipment Utilized During Treatment Gait belt    Activity Tolerance Patient tolerated treatment well    Behavior During Therapy WFL for tasks assessed/performed           Past Medical History:  Diagnosis Date   Acute combined systolic and diastolic CHF, NYHA class 4 (HCC) 06/10/2016   Anemia    Cardiomyopathy (HCC) 06/14/2016   CHF (congestive heart failure) (HCC)    COVID 10/2020   Dyspnea    ESRD on hemodialysis (HCC)    TTS at [facility]   Hypertension    Hypertensive heart and kidney disease with heart failure (HCC) 06/14/2016   Hypertensive heart disease with congestive heart failure (HCC)    Obesity    Past Surgical History:  Procedure Laterality Date   A/V FISTULAGRAM Left 05/03/2022   Procedure: A/V Fistulagram;  Surgeon: Eliza Lonni RAMAN, MD;  Location: [facility] INVASIVE CV LAB;  Service: Cardiovascular;  Laterality: Left;   AV FISTULA PLACEMENT Left 11/11/2020   Procedure: LEFT ARM First Stage Basilic Vein  Fistula Creation;  Surgeon: Sheree Penne Lonni, MD;  Location: [facility] OR;  Service: Vascular;  Laterality: Left;   BASCILIC VEIN TRANSPOSITION Left 01/23/2021   Procedure: Second Stage Basilic Vein Transposition of Left Arm Arteriovenous  Fistula;  Surgeon: Sheree Penne Lonni, MD;  Location: [facility] OR;  Service: Vascular;  Laterality: Left;  Block  to LMA    COLONOSCOPY     FISTULA SUPERFICIALIZATION Left 05/07/2022   Procedure: PLICATION OF ANEURYSM, LEFT ARM FISTULA;  Surgeon: Eliza Lonni RAMAN, MD;  Location: [facility] OR;  Service: Vascular;  Laterality: Left;   IR CT HEAD LTD  10/07/2023   IR CT HEAD LTD  10/07/2023   IR PATIENT EVAL TECH 0-60 MINS  10/07/2023   IR PERCUTANEOUS ART THROMBECTOMY/INFUSION INTRACRANIAL INC DIAG ANGIO  10/07/2023   NO PAST SURGERIES     PERIPHERAL VASCULAR BALLOON ANGIOPLASTY Left 05/03/2022   Procedure: PERIPHERAL VASCULAR BALLOON ANGIOPLASTY;  Surgeon: Eliza Lonni RAMAN, MD;  Location: [facility] INVASIVE CV LAB;  Service: Cardiovascular;  Laterality: Left;  AVF   RADIOLOGY WITH ANESTHESIA N/A 10/07/2023   Procedure: RADIOLOGY WITH ANESTHESIA;  Surgeon: Radiologist, Medication, MD;  Location: [facility] OR;  Service: Radiology;  Laterality: N/A;   Patient Active Problem List   Diagnosis Date Noted   Stroke [facility]) 11/04/2023   Cognitive and neurobehavioral dysfunction 10/24/2023  Acute hypoxic respiratory failure (HCC) 10/17/2023   Essential hypertension 10/17/2023   Dysphagia 10/17/2023   Leukocytosis 10/17/2023   Obesity 10/17/2023   Acute ischemic stroke (HCC) 10/07/2023   Middle cerebral artery embolism, right 10/07/2023   ESRD (end stage renal disease) (HCC) 03/26/2022   Hyperlipidemia, unspecified 03/20/2021   Acute gout due to renal impairment involving left wrist 04/24/2019   Malignant hypertension 10/14/2016   Combined congestive systolic and diastolic heart failure (HCC) 06/14/2016   NICM (nonischemic cardiomyopathy) (HCC) 06/14/2016   Renal failure     Hypertensive urgency 06/10/2016   CKD (chronic kidney disease), stage IV (HCC) 06/10/2016   Normocytic anemia 06/10/2016    ONSET DATE: 12/14/2023- date of referral  REFERRING DIAG: G81.90 (ICD-10-CM) - Hemiparesis (HCC)  THERAPY DIAG:  Hemiplegia and hemiparesis following other cerebrovascular disease affecting left non-dominant side (HCC)  Muscle weakness (generalized)  Other lack of coordination  Other abnormalities of gait and mobility  Rationale for Evaluation and Treatment: Rehabilitation  SUBJECTIVE:  Likes to be called Nathan Campbell.                                                                                                                                                                                            SUBJECTIVE STATEMENT: Pt reports mass (on kidney) appearing suspicious and needs to have it taken out; not scheduled for procedure yet but anticipate it will be soon (needs to make sure he is cleared medically first).  Pt states blood in urine could be from cyst.  Pt reports feeling good and symptoms improved.  No recent falls. Pt accompanied by: self.  Mom drove pt.  PERTINENT HISTORY: Hospitalization 10/07/23 for acute onset of left-sided weakness, left side neglect, dysarthria and right gaze preference; imaging showing acute right MCA CVA with right M1 occlusion (intubated for airway protection; s/p TNK; s/p revascularization of R MCA with IR 7/25).  IP rehab stay 10/18/23-11/04/23. Urgent care visit 12/02/23 for gout (L foot).  PMH includes: gout, CKD, ESRD on HD T/R/Sat, CHF, anemia, htn, stroke 10/07/23, acute hypoxic respiratory failure, dysphagia, cardiomyopathy, and obesity (BMI 41).  PAIN:  Are you having pain? Yes: NPRS scale: 0/10 currently Pain location: L shoulder Pain description: Aching Aggravating factors: Laying down Relieving factors: Muscle relaxer  PRECAUTIONS: Fall; fistula in LUE (dialysis T/Th/Sat)  RED FLAGS: None   WEIGHT BEARING RESTRICTIONS:  No  FALLS: Has patient fallen in last 6 months? No  LIVING ENVIRONMENT: Lives with: Sister and her 2 sons; mom currently staying with pt to assist as needed Lives in: Other Townhouse--stays on 2nd floor Stairs: Yes: Internal: 5 steps; on right going  up and External: 0 steps; none Has following equipment at home: Crutches  PLOF: Independent  PATIENT GOALS: To get more function in arm; get voice back; get strength up.  OBJECTIVE:  Note: Objective measures were completed at Evaluation unless otherwise noted.  COGNITION: Overall cognitive status: A&Ox4; possible decreased awareness of deficits  COORDINATION:  Rapid Alternating toe taps (in sitting): Decreased L LE  Heel to shin (in sitting); mild decreased L LE  LOWER EXTREMITY ROM:     Active  Right Eval Left Eval  Hip flexion WNL WNL  Hip extension    Hip abduction    Hip adduction    Hip internal rotation    Hip external rotation    Knee flexion WNL WNL  Knee extension WNL WNL  Ankle dorsiflexion WNL WNL  Ankle plantarflexion WNL WNL  Ankle inversion    Ankle eversion     (Blank rows = not tested)  LOWER EXTREMITY MMT:    MMT Right Eval Left Eval  Hip flexion 5/5 4+/5  Hip extension    Hip abduction    Hip adduction    Hip internal rotation    Hip external rotation    Knee flexion 5/5 4+/5  Knee extension 5/5 5/5  Ankle dorsiflexion 5/5 4+/5  Ankle plantarflexion At least 3/5 AROM At least 3/5 AROM  Ankle inversion    Ankle eversion    (Blank rows = not tested) GAIT: Findings: Gait Characteristics: step through pattern, decreased arm swing- Left, decreased ankle dorsiflexion- Left, and wide BOS, Distance walked: clinic distances, Assistive device utilized:None, Level of assistance: SBA, and Comments: pt tending to walk close to items on L side but did not bump into anything  FUNCTIONAL TESTS:  5 times sit to stand: 20.81 seconds 10 meter walk test: 1.25 m/sec (7.97 seconds); no AD use Functional gait  assessment: 18/30 (12/26/23) SLS: R LE 1st 2 trials about 1 second each but on 3rd trial 9.0 seconds; L LE 1st 2 trials about 1 second each but on 3rd trial 30 seconds L LE (time stopped by therapist at 30 seconds) TUG (Manual): 9.41 seconds 12/26/23 TUG (Cognitive): 26.25 seconds 12/26/23  FUNCTIONAL GAIT ASSESSMENT  Date: 12/26/23 Score  GAIT LEVEL SURFACE Instructions: Walk at your normal speed from here to the next mark (6 m) [20 ft]. (1) Moderate impairment-Walks 6 m (20 ft), slow speed, abnormal gait pattern, evidence for imbalance, or deviates 25.4 - 38.1 cm (10 -15 in) outside of the 30.48-cm (12-in) walkway width. Requires more than 7 seconds to ambulate 6 m (20 ft). 8.53 seconds  2.   CHANGE IN GAIT SPEED Instructions: Begin walking at your normal pace (for 1.5 m [5 ft]). When I tell you "go," walk as fast as you can (for 1.5 m [5 ft]). When I tell you "slow," walk as slowly as you can (for 1.5 m [5 ft]. (2) Mild impairment - Is able to change speed but demonstrates mild gait deviations, deviates 15.24 -25.4 cm (6 -10 in) outside of the 30.48-cm (12-in) walkway width, or no gait deviations but unable to achieve a significant change in velocity, or uses an assistive device  3.    GAIT WITH HORIZONTAL HEAD TURNS Instructions: Walk from here to the next mark 6 m (20 ft) away. Begin walking at your normal pace. Keep walking straight; after 3 steps, turn your head to the right and keep walking straight while looking to the right. After 3 more steps, turn your head to the left  and keep walking straight while looking left. Continue alternating looking right and left. (2) Mild impairment - Performs head turns smoothly with slight change in gait velocity (eg, minor disruption to smooth gait path), deviates 15.24 -25.4 cm (6 -10 in) outside 30.48-cm (12-in) walkway width, or uses an assistive device.  4.   GAIT WITH VERTICAL HEAD TURNS Instructions: Walk from here to the next mark (6 m [20 ft]). Begin  walking at your normal pace. Keep walking straight; after 3 steps, tip your head up and keep walking straight while looking up. After 3 more steps, tip your head down, keep walking straight while looking down. Continue  alternating looking up and down every 3 steps until you have completed 2 repetitions in each direction. (2) Mild impairment - Performs task with slight change in gait velocity (eg, minor disruption to smooth gait path), deviates 15.24 -25.4 cm (6 -10 in) outside 30.48-cm (12-in) walkway width or uses assistive device.  5.  GAIT AND PIVOT TURN Instructions: Begin with walking at your normal pace. When I tell you, "turn and stop," turn as quickly as you can to face the opposite direction and stop. (2) Mild impairment - Pivot turns safely in 3 seconds and stops with no loss of balance, or pivot turns safely within 3 seconds and stops with mild imbalance, requires small steps to catch balance  6.   STEP OVER OBSTACLE Instructions: Begin walking at your normal speed. When you come to the shoe box, step over it, not around it, and keep walking. (2) Mild impairment - Is able to step over one shoe box (11.43 cm [4.5 in] total height) without changing gait speed; no evidence of imbalance.  7.   GAIT WITH NARROW BASE OF SUPPORT Instructions: Walk on the floor with arms folded across the chest, feet aligned heel to toe in tandem for a distance of 3.6 m [12 ft]. The number of steps taken in a straight line are counted for a maximum of 10 steps. (2) Mild impairment - Ambulates 7-9 steps  8.   GAIT WITH EYES CLOSED Instructions: Walk at your normal speed from here to the next mark (6 m [20 ft]) with your eyes closed. (2) Mild impairment - Walks 6 m (20 ft), uses assistive device, slower speed, mild gait deviations, deviates 15.24 -25.4 cm (6 -10 in) outside 30.48-cm (12-in) walkway width. Ambulates 6 m (20 ft) in less than 9 seconds but greater than 7 seconds 7.72 seconds  9.   AMBULATING  BACKWARDS Instructions: Walk backwards until I tell you to stop (2) Mild impairment - Walks 6 m (20 ft), uses assistive device, slower speed, mild gait deviations, deviates 15.24 -25.4 cm (6 -10 in) outside 30.48-cm (12-in) walkway width12.06 seconds  10. STEPS Instructions: Walk up these stairs as you would at home (ie, using the rail if necessary). At the top turn around and walk down. (1) Moderate impairment-Two feet to a stair; must use rail.  Total 18/30   Interpretation of scores: Non-Specific Older Adults Cutoff Score: <=22/30 = risk of falls Parkinson's Disease Cutoff score <15/30= fall risk (Hoehn & Yahr 1-4)  Minimally Clinically Important Difference (MCID)  Stroke (acute, subacute, and chronic) = MDC: 4.2 points Vestibular (acute) = MDC: 6 points Community Dwelling Older Adults =  MCID: 4 points Parkinson's Disease  =  MDC: 4.3 points  (Academy of Neurologic Physical Therapy (nd). Functional Gait Assessment. Retrieved from https://www.neuropt.org/docs/default-source/cpgs/core-outcome-measures/function-gait-assessment-pocket-guide-proof9-(2).pdf?sfvrsn=b24f35043_0.)   PATIENT SURVEYS:  Stroke Impact Scale : TBA  TREATMENT DATE: 01/23/24  Self Care: BP and HR taken in sitting at rest beginning of session (see below for details). Vitals:   01/23/24 0847  BP: 133/87  Pulse: 88  Post activity/end of session at rest HR 84 bpm  Therapeutic Activity: Ambulation: x230 feet no AD use (to warm up); SBA x345 feet no AD use; 3# B ankle weights (to improve B LE foot clearance); SBA Step ups (6 inch step; 3# B ankle weights; no UE support): x10 reps R LE (forward; up/down step); x10 reps R LE (forward; keeping R foot on step) x10 reps L LE (forward; up down step); x10 reps L LE (forward; keeping L foot on step) x10 reps x2 sets R LE (lateral; keeping R foot on  step) X10 reps x2 sets L LE (lateral; keeping L foot on step) Sit to stands holding 6# ball with B hands (from mat table): x10 reps x2 sets; RPE 6/10 post activity Obstacle course with dual tasking (6 cones placed 3 feet apart each creating 90 degree angle): pt alternating R/L navigating around cones while naming animals (ABC's starting at A); decreased speed walking noted with increased processing time for cognitive task  PATIENT EDUCATION: Education details: Follow-up with PCP regarding SLP referral.  Continue HEP. Person educated: Patient Education method: Explanation, Demonstration, and Verbal cues Education comprehension: verbalized understanding  HOME EXERCISE PROGRAM: 01/13/24 Access Code: [email or web address] URL: [email or web address] Date: 01/13/2024 Prepared by: Damien Caulk  Exercises - Standing March with Counter Support  - 1 x daily - 5 x weekly - 2 sets - 10 reps - Standing Hip Abduction with Counter Support  - 1 x daily - 5 x weekly - 2 sets - 10 reps - Standing Single Leg Stance with Counter Support  - 1 x daily - 5 x weekly - 2 sets - 1 reps - 30 second hold - Supine Bridge  - 1 x daily - 5 x weekly - 2 sets - 10 reps - Clamshell  - 1 x daily - 5 x weekly - 2 sets - 10 reps  GOALS: Goals reviewed with patient? Yes  SHORT TERM GOALS: Target date: 01/20/2024 (assessed 01/20/24)  Patient will demo at least 50% compliance with HEP to self manage symptoms.  Baseline: Goal status: MET (pt reports 50% compliance)  2.  Assess FGA Baseline: 18/30 (12/26/23) Goal status: MET  3.  Assess TUG (Cognitive) Baseline: 26.25 seconds Goal status: MET   LONG TERM GOALS: Target date: 02/17/2024   Pt will decrease 5 Time Sit to Stand by at least 3 seconds in order to demonstrate clinically significant improvement in LE strength.  Baseline: 20.81 seconds (Eval) Goal status: INITIAL  2.  Pt will decrease TUG (Cognitive) by at least (or equal to) 3 seconds in order to  demonstrate decreased fall risk. Baseline: 26.25 seconds  Goal status: INITIAL  3.  Patient will demo at least 5 points of improvement on FGA to improve functional balance and reduce fall risk. Baseline: 18/30 (12/26/23) Goal status: INITIAL  4.  Pt will be independent with final HEP in order to improve strength and balance, to decrease fall risk, and improve function at home for ADL's and at work. Baseline:  Goal status: INITIAL   ASSESSMENT:  CLINICAL IMPRESSION: Patient was seen today for physical therapy treatment to address ambulation and transfers.  Focused session on improving foot clearance with ambulation (with use of ankle weights), improving hip stability and balance with step ups using ankle weights, improving transfer technique (while  holding weighted ball), and improving dual tasking while ambulating navigating around obstacles.  Patient continues to be limited by strength and balance.  They demonstrate improvement in activity tolerance today with decreased rest breaks and pacing required.  Pt continues to appear very motivated to participate in therapy and improve status.  They would continue to benefit from skilled PT to address impairments as noted and progress towards long term goals.  OBJECTIVE IMPAIRMENTS: Abnormal gait, cardiopulmonary status limiting activity, decreased activity tolerance, decreased balance, decreased cognition, decreased coordination, decreased endurance, decreased knowledge of condition, decreased knowledge of use of DME, decreased mobility, difficulty walking, decreased strength, decreased safety awareness, increased fascial restrictions, impaired flexibility, impaired sensation, impaired tone, impaired UE functional use, impaired vision/preception, improper body mechanics, and pain.   ACTIVITY LIMITATIONS: carrying, lifting, bending, standing, squatting, sleeping, stairs, transfers, locomotion level, and caring for others  PARTICIPATION LIMITATIONS:  meal prep, cleaning, laundry, medication management, driving, shopping, community activity, occupation, and yard work  PERSONAL FACTORS: Age, Time since onset of injury/illness/exacerbation, and 1-2 comorbidities: CHF, hx of CVA, ESRD are also affecting patient's functional outcome.   REHAB POTENTIAL: Good  CLINICAL DECISION MAKING: Evolving/moderate complexity  EVALUATION COMPLEXITY: Moderate  PLAN:  PT FREQUENCY: 2x/week  PT DURATION: 8 weeks  PLANNED INTERVENTIONS: [id number]- PT Re-evaluation, 97750- Physical Performance Testing, 97110-Therapeutic exercises, 97530- Therapeutic activity, 97112- Neuromuscular re-education, 97535- Self Care, [id number]- Manual therapy, [phone]- Gait training, [phone]- Orthotic/Prosthetic subsequent, [phone]- Aquatic Therapy, [phone]- Electrical stimulation (unattended), Patient/Family education, Balance training, Stair training, Taping, Joint mobilization, Spinal mobilization, Cognitive remediation, DME instructions, Wheelchair mobility training, Cryotherapy, and Moist heat  PLAN FOR NEXT SESSION: SLP referral (did pt call PCP to request)?; higher level balance with cognitive tasks/dual tasks; [employer]; challenge awareness of L side, L hip abd strengthening, quadruped, half kneel, turning   [employer], PT 01/23/2024, 4:23 PM

## 2024-01-23 NOTE — Therapy (Signed)
 OUTPATIENT OCCUPATIONAL THERAPY NEURO TREATMENT  Patient Name: Nathan Campbell MRN: [id number] DOB:[birth year], 48 y.o., male Today's Date: 01/23/2024  PCP: None REFERRING PROVIDER: Sebastian Jerilyn HERO, FNP  END OF SESSION:  OT End of Session - 01/23/24 1034     Visit Number 8    Number of Visits 16    Date for Recertification  02/22/24    Authorization Type [employer] [facility]    Authorization Time Period 12/30/23-02/24/24    Authorization - Visit Number 7    Authorization - Number of Visits 17    Progress Note Due on Visit 10    OT Start Time 0928    OT Stop Time 1021    OT Time Calculation (min) 53 min    Equipment Utilized During Treatment estim    Activity Tolerance Patient tolerated treatment well    Behavior During Therapy [facility] for tasks assessed/performed           Past Medical History:  Diagnosis Date   Acute combined systolic and diastolic CHF, NYHA class 4 (HCC) 06/10/2016   Anemia    Cardiomyopathy (HCC) 06/14/2016   CHF (congestive heart failure) (HCC)    COVID 10/2020   Dyspnea    ESRD on hemodialysis (HCC)    TTS at [facility]   Hypertension    Hypertensive heart and kidney disease with heart failure (HCC) 06/14/2016   Hypertensive heart disease with congestive heart failure (HCC)    Obesity    Past Surgical History:  Procedure Laterality Date   A/V FISTULAGRAM Left 05/03/2022   Procedure: A/V Fistulagram;  Surgeon: Eliza Lonni RAMAN, MD;  Location: [facility] INVASIVE CV LAB;  Service: Cardiovascular;  Laterality: Left;   AV FISTULA PLACEMENT Left 11/11/2020   Procedure: LEFT ARM First Stage Basilic Vein Fistula Creation;  Surgeon: Sheree Penne Lonni, MD;  Location: [facility] OR;  Service: Vascular;  Laterality: Left;   BASCILIC VEIN TRANSPOSITION Left 01/23/2021   Procedure: Second Stage Basilic Vein Transposition of Left Arm Arteriovenous  Fistula;  Surgeon: Sheree Penne Lonni, MD;  Location: [facility] OR;  Service: Vascular;  Laterality: Left;  Block  to LMA     COLONOSCOPY     FISTULA SUPERFICIALIZATION Left 05/07/2022   Procedure: PLICATION OF ANEURYSM, LEFT ARM FISTULA;  Surgeon: Eliza Lonni RAMAN, MD;  Location: [facility] OR;  Service: Vascular;  Laterality: Left;   IR CT HEAD LTD  10/07/2023   IR CT HEAD LTD  10/07/2023   IR PATIENT EVAL TECH 0-60 MINS  10/07/2023   IR PERCUTANEOUS ART THROMBECTOMY/INFUSION INTRACRANIAL INC DIAG ANGIO  10/07/2023   NO PAST SURGERIES     PERIPHERAL VASCULAR BALLOON ANGIOPLASTY Left 05/03/2022   Procedure: PERIPHERAL VASCULAR BALLOON ANGIOPLASTY;  Surgeon: Eliza Lonni RAMAN, MD;  Location: [facility] INVASIVE CV LAB;  Service: Cardiovascular;  Laterality: Left;  AVF   RADIOLOGY WITH ANESTHESIA N/A 10/07/2023   Procedure: RADIOLOGY WITH ANESTHESIA;  Surgeon: Radiologist, Medication, MD;  Location: [facility] OR;  Service: Radiology;  Laterality: N/A;   Patient Active Problem List   Diagnosis Date Noted   Stroke (HCC) 11/04/2023   Cognitive and neurobehavioral dysfunction 10/24/2023   Acute hypoxic respiratory failure (HCC) 10/17/2023   Essential hypertension 10/17/2023   Dysphagia 10/17/2023   Leukocytosis 10/17/2023   Obesity 10/17/2023   Acute ischemic stroke (HCC) 10/07/2023   Middle cerebral artery embolism, right 10/07/2023   ESRD (end stage renal disease) (HCC) 03/26/2022   Hyperlipidemia, unspecified 03/20/2021   Acute gout due to renal impairment involving left wrist 04/24/2019  Malignant hypertension 10/14/2016   Combined congestive systolic and diastolic heart failure (HCC) 06/14/2016   NICM (nonischemic cardiomyopathy) (HCC) 06/14/2016   Renal failure    Hypertensive urgency 06/10/2016   CKD (chronic kidney disease), stage IV (HCC) 06/10/2016   Normocytic anemia 06/10/2016    ONSET DATE: 12/19/2023 referral date Admit date: 10/18/2023 Discharge date: 11/04/2023  REFERRING DIAG: G81.90 (ICD-10-CM) - Hemiplegia, unspecified affecting unspecified side  THERAPY DIAG:  Other lack of coordination  Attention  and concentration deficit  Other symptoms and signs involving the musculoskeletal system  Other symptoms and signs involving the nervous system  Rationale for Evaluation and Treatment: Rehabilitation  SUBJECTIVE:   SUBJECTIVE STATEMENT: Pt reports Saebo glove and jar/can opener are in the cart, waiting to get check.  Pt accompanied by: self  PERTINENT HISTORY: Pt is a 48 yo male presenting to this clinic with hemiplegia s/p R MCA embolism. Pt was hospitalized for this from 10/07/23-10/18/23, then discharged to neurology and received IP PT/OT/SLP 10/18/23-11/04/23. Initial CT head revealed right MCA territory ischemic changes with calcific density in proximal right M1 concerning for calcific embolus.   CHF, CM, ESRD on HD TTS, HTN, obesity, HLD  PRECAUTIONS: Fall, L sided weakness, blood thinners  WEIGHT BEARING RESTRICTIONS: No  PAIN:  Are you having pain? No  FALLS: Has patient fallen in last 6 months? No  LIVING ENVIRONMENT: Lives with: lives with their family sister and her 2 sons. Pt reports will be getting own place soon. Lives in: House/apartment townhouse, 2 level Stairs: Yes: Internal: 3 steps; on right going up Has following equipment at home: None  PLOF: Independent and Vocation/Vocational requirements: Worked in distribution in [employer]   PATIENT GOALS: Get back movement in my left arm, return to work, and be able to play video games like I was before.  OBJECTIVE:  Note: Objective measures were completed at Evaluation unless otherwise noted.  HAND DOMINANCE: Right  ADLs: Overall ADLs: Independent Transfers/ambulation related to ADLs: Independent Eating: Independent Grooming: Independent UB Dressing: Independent LB Dressing: Independent Toileting: Independent Bathing: Independent Tub Shower transfers: Independent, shower/tub combo Equipment: none  IADLs: Shopping: Independent Light housekeeping: Mom is doing pharmacologist.  Meal Prep: Needs some help with  opening cans and jars d/t weakness in L hand Community mobility: Mother driving pt to appts  Medication management: Mother helping with medication mgmt Financial management: Independent Handwriting: did not assess d/t pt report of no changes and pt dominant hand unaffected.   MOBILITY STATUS: Independent  POSTURE COMMENTS:  No Significant postural limitations Sitting balance: WFL  ACTIVITY TOLERANCE: Activity tolerance: No changes  FUNCTIONAL OUTCOME MEASURES: Quick Dash: 47.7/100: 47.7% impairment  UPPER EXTREMITY ROM:  R WNL  Active ROM Right eval Left eval  Shoulder flexion  92  Shoulder abduction  70  Wrist flexion  10  Wrist extension  40  Wrist pronation    Wrist supination  Impaired (re-assess)  (Blank rows = not tested)  UPPER EXTREMITY MMT:   WFL RUE, 3-/5 grossly for LUE  HAND FUNCTION: Grip strength: Right: 61.2 lbs; Left: 15.8 lbs 01/20/24: Left: 19.4 lbs  COORDINATION: 9 Hole Peg test: Right: 41.60 sec; Left: unable to pick up a peg in 2 minutes sec Box and Blocks:  Right 36 blocks, Left 11blocks 01/20/24: Left: 15 blocks   SENSATION: WFL  EDEMA: none  MUSCLE TONE: LUE: Rigidity  COGNITION: Overall cognitive status: Within functional limits for tasks assessed  VISION: Subjective report: no concerns Baseline vision: No visual deficits Visual  history: no hx  VISION ASSESSMENT: WFL To be further assessed in functional context  Patient has difficulty with following activities due to following visual impairments: none  PERCEPTION: WFL  PRAXIS: Impaired: Ideomotor  OBSERVATIONS: Weakness in L hand and limited ROM in LUE affecting ability to complete ADL/IADL.                                                                                                                            TREATMENT: 01/23/24     - Neuro re-education completed for duration as noted below including:  OT educated patient on use of NMES unit as well as e-stim to  encourage improved nerve function as needed to produce more active LUE ROM and increase functional use through muscular re-education. Therapist educated patient on application for L shoulder flexion and applied electrodes accordingly. Therapist utilized e-stim Russian waveform with 10/10 cycle time, 50% duty cycle, burst frequency of 50 bps, and ramp of 2 seconds. Patient tolerating up to 25. intensity with supervised application to promote improved AROM and pain management. Therapist cued patient to complete shoulder flexion activities during the NMES "on" cycle and shoulder extension during the "off" cycle to replicate functional reach and placement of the affected LUE. Functional activities incorporated included reaching for pegs of varying sizes at varying angles, grasping them for 10 seconds, and then releasing them on tabletop. Able to tolerate grading up to small golf tees, cues required for pinching.  Pt next participated in FM coordination activities to carry over with leisure tasks and ADLs. Attempted to pinch golf tees and place in wooden board in tic-tac-toe game, however difficulty noted with pinching and manipulating small piece. Therefore changed activity, instructed to grasp game token and stack, . Next instructed to pick up token pieces one by one then flip onto other side. Mod cues required for proper form.   Utilized resistive pins and red theraputty to hone tip to tip pinch of L hand. Pt this date reported not sleeping well and appeared lethargic during tx, checked BP. 138/93, 82 HR.  PATIENT EDUCATION: Education details:  Person educated: Patient Education method: Programmer, Multimedia, Facilities Manager, Actor cues, Verbal cues, and Handouts Education comprehension: verbalized understanding, returned demonstration, verbal cues required, and needs further education  HOME EXERCISE PROGRAM: 12/30/23: Shoulder, Forearm/wrist, and hand HEP  01/02/24: Theraputty HEP with red putty (ACCESS CODE  [email or web address]) 01/11/24: Shoulder shrug exercises (same access code as above) 01/16/24: E-stim and saebo glove [employer] 01/20/24: Jar/can opener [employer]  GOALS: Goals reviewed with patient? Yes  SHORT TERM GOALS: Target date: 01/23/24  Pt will be independent with LUE ROM, coordination, and grip strengthening HEP Baseline: New to OP OT Goal status: IN PROGRESS  2.  Pt will increase L shoulder abduction to at least 90 degrees for improved functional use  Baseline: 70 degrees  01/20/24: 90 degrees  Goal status:GOAL MET    3.  Pt will be independent with sleeping strategies for hemiparesis and  hemiplegia of LUE Baseline: New to OP OT Goal status: IN PROGRESS  4.  Pt will be independent with resting hand splint wear and care for night use and to improve functional use in L hand Baseline: New to OP OT Goal status: IN PROGRESS 5. Pt will be able to place at least 25 blocks using left hand with completion of Box and Blocks test.   Baseline: 15 blocks  Goal status: NEW    LONG TERM GOALS: Target date: 02/22/24  Pt will decrease QuickDASH score to no more than 25/100 to demonstrate improved function in daily tasks Baseline: 47.5/100 Goal status: IN PROGRESS  2.  Pt will increase L shoulder flexion ROM to at least 120 degrees for improved functional use Baseline: 92 degrees 01/20/24: 92 degrees  Goal status:IN PROGRESS  3.  Pt will increase L shoulder abduction ROM to at least 100 degrees for improved functional use Baseline: 70 degrees 01/20/24:  90 degrees  Goal status: IN PROGRESS  4.  Pt will increase L wrist flexion and extension by at least 40 degrees and 20 degrees, respectively. Baseline: 10 degrees wrist flexion, 40 degrees wrist extension 01/20/24: 32 degrees wrist flexion, 63 degrees wrist extension Goal status: IN PROGRESS 5. Patient will demonstrate at least 40 lbs L grip strength as needed to open jars and other containers.   Baseline: 19.2  Goal status:  NEW  ASSESSMENT:  CLINICAL IMPRESSION: Patient presents to tx with LUE weakness and impaired coordination secondary to CVA.  Pt required mod cues for proper form of FM activities. He will continue to benefit from skilled outpatient OT to improve functional use of his L UE for ADLs and IADLs.   PERFORMANCE DEFICITS: in functional skills including ADLs, IADLs, coordination, dexterity, tone, ROM, strength, pain, Fine motor control, body mechanics, cardiopulmonary status limiting function, and UE functional use, cognitive skills including attention, safety awareness, and sequencing, and psychosocial skills including coping strategies, interpersonal interactions, and routines and behaviors.   IMPAIRMENTS: are limiting patient from IADLs, rest and sleep, work, and leisure.   CO-MORBIDITIES: has co-morbidities such as ESRD that affects occupational performance. Patient will benefit from skilled OT to address above impairments and improve overall function.  REHAB POTENTIAL: Good   PLAN:  OT FREQUENCY: 2x/week  OT DURATION: 8 weeks  PLANNED INTERVENTIONS: 97168 OT Re-evaluation, 97535 self care/ADL training, 02889 therapeutic exercise, 97530 therapeutic activity, 97112 neuromuscular re-education, 97140 manual therapy, 97032 electrical stimulation (manual), 97760 Orthotic Initial, [id number] Orthotic/Prosthetic subsequent, passive range of motion, functional mobility training, visual/perceptual remediation/compensation, energy conservation, patient/family education, and DME and/or AE instructions  RECOMMENDED OTHER SERVICES: none noted  CONSULTED AND AGREED WITH PLAN OF CARE: Patient  PLAN FOR NEXT SESSION:  Continue with E-stim- L shoulder, attempt L hand as needed Coordination/ strengthening activities ROM activities WB activities  [employer], OT 01/23/2024, 10:37 AM

## 2024-01-24 ENCOUNTER — Telehealth: Payer: Self-pay

## 2024-01-24 NOTE — OR PreOp (Signed)
 [facility] Preoperative Optimization Triage Encounter Date: Tue 01/24/2024   Chart reviewed by [facility], RN. At time of triage, per case posting, patient scheduled for 252 mins with dispo 23 hour OBS. Based on POC triage guidelines, nature of the procedure scheduled, and extensive chart review completed, patient determined to have POC in person appointment made.  BMI Readings from Last 1 Encounters:  01/09/24 37.84 kg/m   BP Readings from Last 3 Encounters:  01/20/24 (!) 145/94  01/09/24 (!) 147/98  05/25/23 (!) 169/99   Pulse Readings from Last 2 Encounters:  01/20/24 76  01/09/24 76    SpO2 Readings from Last 2 Encounters:  01/20/24 99%  01/09/24 99%     Lab Results  Component Value Date   HGBA1C 5.0 05/25/2023    Lab Results  Component Value Date   WBC 6.50 05/25/2023   HGB 11.3 (L) 05/25/2023   HCT 33.7 (L) 05/25/2023   MCV 99.6 (H) 05/25/2023   PLT 239 05/25/2023    Lab Results  Component Value Date   GLUCOSE 83 05/25/2023   CALCIUM  10.5 (H) 05/25/2023   NA 140 05/25/2023   K 4.6 05/25/2023   CO2 30 05/25/2023   CL 97 (L) 05/25/2023   BUN 50 (H) 05/25/2023   CREATININE 10.59 (H) 05/25/2023     Procedure Information     Date: 03/21/24   Procedure: robotic assisted laparoscopic left NEPHRECTOMY RADICAL, rigid cystoscopy (Left)   Location: [facility] OR   Surgeons: Lauraine Fleming, MD

## 2024-01-24 NOTE — Patient Outreach (Signed)
 mRs could not be obtained due to patient's mother declining. Declined as they are still going thur a lot and do not wish to do survey  mRs=7    Shereen Gin [facility] Health Care Management Assistant  Direct Dial : [phone]  Fax: [phone] Website: [email or web address]

## 2024-01-24 NOTE — Telephone Encounter (Signed)
-----   Message from Lauraine Fleming, MD sent at 01/21/2024 10:13 AM EST ----- Schedule for Jan 7 th. Please note my plan for preop clearance from neurology  Definitely needs PAT

## 2024-01-24 NOTE — Telephone Encounter (Signed)
 Case Completed, left detailed message:  Pre-operative Assessment Date : PAT ORDER SENT  Surgery Date: 03/21/2024 Surgery Location: MAIN OR Surgery packet mailed    Pre Op Clearance requested from: Neurology  Clearance faxed to:  Office: [place] Provider: Maebelle Falling Phone number: [phone] Fax number: [phone] Confirmation Received: Yes/No: Yes  Electronically signed by: Charmaine Cambric 01/24/2024 10:41 AM

## 2024-01-27 ENCOUNTER — Ambulatory Visit

## 2024-01-27 ENCOUNTER — Encounter: Payer: Self-pay | Admitting: Physical Therapy

## 2024-01-27 ENCOUNTER — Ambulatory Visit: Admitting: Physical Therapy

## 2024-01-27 VITALS — BP 142/90 | HR 92

## 2024-01-27 DIAGNOSIS — M6281 Muscle weakness (generalized): Secondary | ICD-10-CM

## 2024-01-27 DIAGNOSIS — I69854 Hemiplegia and hemiparesis following other cerebrovascular disease affecting left non-dominant side: Secondary | ICD-10-CM

## 2024-01-27 DIAGNOSIS — R278 Other lack of coordination: Secondary | ICD-10-CM | POA: Diagnosis not present

## 2024-01-27 DIAGNOSIS — R2689 Other abnormalities of gait and mobility: Secondary | ICD-10-CM

## 2024-01-27 NOTE — Therapy (Signed)
 OUTPATIENT PHYSICAL THERAPY NEURO TREATMENT   Patient Name: Nathan Campbell MRN: [id number] DOB:Jun 30, 1975, 48 y.o., male Today's Date: 01/27/2024   PCP: Pt reports PCP is Blad but did not give any further identifying information. REFERRING PROVIDER: Sebastian Jerilyn HERO, FNP  END OF SESSION:   01/27/24 0903  PT Visits / Re-Eval  Visit Number 11  Number of Visits 17 (16 plus Eval)  Date for Recertification  02/24/24 (For scheduling delays)  Authorization  Authorization Type [facility] Medicare  Authorization Time Period 12/26/23 - 02/20/24  Authorization - Visit Number 11  Authorization - Number of Visits 17  Progress Note Due on Visit 10  PT Time Calculation  PT Start Time 0900 (pt checked in late (pt reporting waiting area was busy so he went outside to wait but didn't check in; pt was not in waiting room when therapist checked for pt))  PT Stop Time 0930  PT Time Calculation (min) 30 min  PT - End of Session  Equipment Utilized During Treatment Gait belt  Activity Tolerance Patient tolerated treatment well  Behavior During Therapy [facility] for tasks assessed/performed    Past Medical History:  Diagnosis Date   Acute combined systolic and diastolic CHF, NYHA class 4 (HCC) 06/10/2016   Anemia    Cardiomyopathy (HCC) 06/14/2016   CHF (congestive heart failure) (HCC)    COVID 10/2020   Dyspnea    ESRD on hemodialysis (HCC)    TTS at [facility]   Hypertension    Hypertensive heart and kidney disease with heart failure (HCC) 06/14/2016   Hypertensive heart disease with congestive heart failure (HCC)    Obesity    Past Surgical History:  Procedure Laterality Date   A/V FISTULAGRAM Left 05/03/2022   Procedure: A/V Fistulagram;  Surgeon: Eliza Lonni RAMAN, MD;  Location: [facility] INVASIVE CV LAB;  Service: Cardiovascular;  Laterality: Left;   AV FISTULA PLACEMENT Left 11/11/2020   Procedure: LEFT ARM First Stage Basilic Vein Fistula Creation;  Surgeon: Sheree Penne Lonni, MD;  Location: [facility] OR;  Service: Vascular;  Laterality: Left;   BASCILIC VEIN TRANSPOSITION Left 01/23/2021   Procedure: Second Stage Basilic Vein Transposition of Left Arm Arteriovenous  Fistula;  Surgeon: Sheree Penne Lonni, MD;  Location[facility] OR;  Service: Vascular;  Laterality: Left;  Block  to LMA    COLONOSCOPY     FISTULA SUPERFICIALIZATION Left 05/07/2022   Procedure: PLICATION OF ANEURYSM, LEFT ARM FISTULA;  Surgeon: Eliza Lonni RAMAN, MD;  Location: [facility] OR;  Service: Vascular;  Laterality: Left;   IR CT HEAD LTD  10/07/2023   IR CT HEAD LTD  10/07/2023   IR PATIENT EVAL TECH 0-60 MINS  10/07/2023   IR PERCUTANEOUS ART THROMBECTOMY/INFUSION INTRACRANIAL INC DIAG ANGIO  10/07/2023   NO PAST SURGERIES     PERIPHERAL VASCULAR BALLOON ANGIOPLASTY Left 05/03/2022   Procedure: PERIPHERAL VASCULAR BALLOON ANGIOPLASTY;  Surgeon: Eliza Lonni RAMAN, MD;  Location: [facility] INVASIVE CV LAB;  Service: Cardiovascular;  Laterality: Left;  AVF   RADIOLOGY WITH ANESTHESIA N/A 10/07/2023   Procedure: RADIOLOGY WITH ANESTHESIA;  Surgeon: Radiologist, Medication, MD;  Location: [facility] OR;  Service: Radiology;  Laterality: N/A;   Patient Active Problem List   Diagnosis Date Noted   Stroke (HCC) 11/04/2023   Cognitive and neurobehavioral dysfunction 10/24/2023   Acute hypoxic respiratory failure (HCC) 10/17/2023   Essential hypertension 10/17/2023   Dysphagia 10/17/2023   Leukocytosis 10/17/2023   Obesity 10/17/2023   Acute ischemic stroke (HCC) 10/07/2023   Middle cerebral  artery embolism, right 10/07/2023   ESRD (end stage renal disease) (HCC) 03/26/2022   Hyperlipidemia, unspecified 03/20/2021   Acute gout due to renal impairment involving left wrist 04/24/2019   Malignant hypertension 10/14/2016   Combined congestive systolic and diastolic heart failure (HCC) 06/14/2016   NICM (nonischemic cardiomyopathy) (HCC) 06/14/2016   Renal failure    Hypertensive urgency 06/10/2016   CKD  (chronic kidney disease), stage IV (HCC) 06/10/2016   Normocytic anemia 06/10/2016    ONSET DATE: 12/14/2023- date of referral  REFERRING DIAG: G81.90 (ICD-10-CM) - Hemiparesis (HCC)  THERAPY DIAG:  Hemiplegia and hemiparesis following other cerebrovascular disease affecting left non-dominant side (HCC)  Muscle weakness (generalized)  Other lack of coordination  Other abnormalities of gait and mobility  Rationale for Evaluation and Treatment: Rehabilitation  SUBJECTIVE:  Likes to be called LC.                                                                                                                                                                                            SUBJECTIVE STATEMENT: Pt late for session (pt reports he arrived but waiting room was busy so he went outside but didn't check in; therapist had checked waiting room for pt).  Pt reports blood in urine stopped.  Going to see PCP today.  Procedure scheduled for January 7th for kidney procedure.  No recent falls reported.  Pt reports haven't been able to get hold of PCP regarding SLP consult (will try again today). Pt accompanied by: self.  Mom drove pt.  PERTINENT HISTORY: Hospitalization 10/07/23 for acute onset of left-sided weakness, left side neglect, dysarthria and right gaze preference; imaging showing acute right MCA CVA with right M1 occlusion (intubated for airway protection; s/p TNK; s/p revascularization of R MCA with IR 7/25).  IP rehab stay 10/18/23-11/04/23. Urgent care visit 12/02/23 for gout (L foot).  PMH includes: gout, CKD, ESRD on HD T/R/Sat, CHF, anemia, htn, stroke 10/07/23, acute hypoxic respiratory failure, dysphagia, cardiomyopathy, and obesity (BMI 41).  PAIN:  Are you having pain? Yes: NPRS scale: 0/10 currently Pain location: L shoulder Pain description: Aching Aggravating factors: Laying down Relieving factors: Muscle relaxer  PRECAUTIONS: Fall; fistula in LUE (dialysis T/Th/Sat)  RED  FLAGS: None   WEIGHT BEARING RESTRICTIONS: No  FALLS: Has patient fallen in last 6 months? No  LIVING ENVIRONMENT: Lives with: Sister and her 2 sons; mom currently staying with pt to assist as needed Lives in: Other Townhouse--stays on 2nd floor Stairs: Yes: Internal: 5 steps; on right going up and External: 0 steps; none Has following equipment at home: Crutches  PLOF: Independent  PATIENT  GOALS: To get more function in arm; get voice back; get strength up.  OBJECTIVE:  Note: Objective measures were completed at Evaluation unless otherwise noted.  COGNITION: Overall cognitive status: A&Ox4; possible decreased awareness of deficits  COORDINATION:  Rapid Alternating toe taps (in sitting): Decreased L LE  Heel to shin (in sitting); mild decreased L LE  LOWER EXTREMITY ROM:     Active  Right Eval Left Eval  Hip flexion WNL WNL  Hip extension    Hip abduction    Hip adduction    Hip internal rotation    Hip external rotation    Knee flexion WNL WNL  Knee extension WNL WNL  Ankle dorsiflexion WNL WNL  Ankle plantarflexion WNL WNL  Ankle inversion    Ankle eversion     (Blank rows = not tested)  LOWER EXTREMITY MMT:    MMT Right Eval Left Eval  Hip flexion 5/5 4+/5  Hip extension    Hip abduction    Hip adduction    Hip internal rotation    Hip external rotation    Knee flexion 5/5 4+/5  Knee extension 5/5 5/5  Ankle dorsiflexion 5/5 4+/5  Ankle plantarflexion At least 3/5 AROM At least 3/5 AROM  Ankle inversion    Ankle eversion    (Blank rows = not tested) GAIT: Findings: Gait Characteristics: step through pattern, decreased arm swing- Left, decreased ankle dorsiflexion- Left, and wide BOS, Distance walked: clinic distances, Assistive device utilized:None, Level of assistance: SBA, and Comments: pt tending to walk close to items on L side but did not bump into anything  FUNCTIONAL TESTS:  5 times sit to stand: 20.81 seconds 10 meter walk test: 1.25  m/sec (7.97 seconds); no AD use Functional gait assessment: 18/30 (12/26/23) SLS: R LE 1st 2 trials about 1 second each but on 3rd trial 9.0 seconds; L LE 1st 2 trials about 1 second each but on 3rd trial 30 seconds L LE (time stopped by therapist at 30 seconds) TUG (Manual): 9.41 seconds 12/26/23 TUG (Cognitive): 26.25 seconds 12/26/23  FUNCTIONAL GAIT ASSESSMENT  Date: 12/26/23 Score  GAIT LEVEL SURFACE Instructions: Walk at your normal speed from here to the next mark (6 m) [20 ft]. (1) Moderate impairment-Walks 6 m (20 ft), slow speed, abnormal gait pattern, evidence for imbalance, or deviates 25.4 - 38.1 cm (10 -15 in) outside of the 30.48-cm (12-in) walkway width. Requires more than 7 seconds to ambulate 6 m (20 ft). 8.53 seconds  2.   CHANGE IN GAIT SPEED Instructions: Begin walking at your normal pace (for 1.5 m [5 ft]). When I tell you "go," walk as fast as you can (for 1.5 m [5 ft]). When I tell you "slow," walk as slowly as you can (for 1.5 m [5 ft]. (2) Mild impairment - Is able to change speed but demonstrates mild gait deviations, deviates 15.24 -25.4 cm (6 -10 in) outside of the 30.48-cm (12-in) walkway width, or no gait deviations but unable to achieve a significant change in velocity, or uses an assistive device  3.    GAIT WITH HORIZONTAL HEAD TURNS Instructions: Walk from here to the next mark 6 m (20 ft) away. Begin walking at your normal pace. Keep walking straight; after 3 steps, turn your head to the right and keep walking straight while looking to the right. After 3 more steps, turn your head to the left and keep walking straight while looking left. Continue alternating looking right and left. (2) Mild impairment -  Performs head turns smoothly with slight change in gait velocity (eg, minor disruption to smooth gait path), deviates 15.24 -25.4 cm (6 -10 in) outside 30.48-cm (12-in) walkway width, or uses an assistive device.  4.   GAIT WITH VERTICAL HEAD TURNS Instructions: Walk  from here to the next mark (6 m [20 ft]). Begin walking at your normal pace. Keep walking straight; after 3 steps, tip your head up and keep walking straight while looking up. After 3 more steps, tip your head down, keep walking straight while looking down. Continue  alternating looking up and down every 3 steps until you have completed 2 repetitions in each direction. (2) Mild impairment - Performs task with slight change in gait velocity (eg, minor disruption to smooth gait path), deviates 15.24 -25.4 cm (6 -10 in) outside 30.48-cm (12-in) walkway width or uses assistive device.  5.  GAIT AND PIVOT TURN Instructions: Begin with walking at your normal pace. When I tell you, "turn and stop," turn as quickly as you can to face the opposite direction and stop. (2) Mild impairment - Pivot turns safely in 3 seconds and stops with no loss of balance, or pivot turns safely within 3 seconds and stops with mild imbalance, requires small steps to catch balance  6.   STEP OVER OBSTACLE Instructions: Begin walking at your normal speed. When you come to the shoe box, step over it, not around it, and keep walking. (2) Mild impairment - Is able to step over one shoe box (11.43 cm [4.5 in] total height) without changing gait speed; no evidence of imbalance.  7.   GAIT WITH NARROW BASE OF SUPPORT Instructions: Walk on the floor with arms folded across the chest, feet aligned heel to toe in tandem for a distance of 3.6 m [12 ft]. The number of steps taken in a straight line are counted for a maximum of 10 steps. (2) Mild impairment - Ambulates 7-9 steps  8.   GAIT WITH EYES CLOSED Instructions: Walk at your normal speed from here to the next mark (6 m [20 ft]) with your eyes closed. (2) Mild impairment - Walks 6 m (20 ft), uses assistive device, slower speed, mild gait deviations, deviates 15.24 -25.4 cm (6 -10 in) outside 30.48-cm (12-in) walkway width. Ambulates 6 m (20 ft) in less than 9 seconds but greater than 7 seconds  7.72 seconds  9.   AMBULATING BACKWARDS Instructions: Walk backwards until I tell you to stop (2) Mild impairment - Walks 6 m (20 ft), uses assistive device, slower speed, mild gait deviations, deviates 15.24 -25.4 cm (6 -10 in) outside 30.48-cm (12-in) walkway width12.06 seconds  10. STEPS Instructions: Walk up these stairs as you would at home (ie, using the rail if necessary). At the top turn around and walk down. (1) Moderate impairment-Two feet to a stair; must use rail.  Total 18/30   Interpretation of scores: Non-Specific Older Adults Cutoff Score: <=22/30 = risk of falls Parkinson's Disease Cutoff score <15/30= fall risk (Hoehn & Yahr 1-4)  Minimally Clinically Important Difference (MCID)  Stroke (acute, subacute, and chronic) = MDC: 4.2 points Vestibular (acute) = MDC: 6 points Community Dwelling Older Adults =  MCID: 4 points Parkinson's Disease  =  MDC: 4.3 points  (Academy of Neurologic Physical Therapy (nd). Functional Gait Assessment. Retrieved from https://www.neuropt.org/docs/default-source/cpgs/core-outcome-measures/function-gait-assessment-pocket-guide-proof9-(2).pdf?sfvrsn=b77f35043_0.)   PATIENT SURVEYS:  Stroke Impact Scale : TBA  TREATMENT DATE: 01/27/24  Self Care: BP and HR taken in sitting at rest beginning of session (see below for details). Vitals:   01/27/24 0904  BP: (!) 142/90  Pulse: 92  HR 88 bpm post activity  Therapeutic Activity: Sit to stands from mat table:  x10 reps (no UE support) x10 reps holding 8# medicine ball at chest level with B hands Notes: limited d/t pt fatigue holding 8# medicine ball Sit to stands from mat table on [employer]: x5 reps x 2 sets no UE support Notes: pt initially requiring min assist (d/t posterior lean) when sitting but improved to CGA with cueing for technique; CGA progressing to SBA with  standing Toe taps at steps on 12 inch step with 3# B ankle weights (no UE support; SBA): x10 reps x2 sets R LE x10 reps x2 sets L LE Ambulation: x115 feet with no AD use and B 3# ankle weights; SBA   PATIENT EDUCATION: Education details: Check in with front desk when arriving for therapy appointment.  Pt to follow-up with PCP regarding SLP referral.  Continue HEP. Person educated: Patient Education method: Explanation, Demonstration, and Verbal cues Education comprehension: verbalized understanding  HOME EXERCISE PROGRAM: 01/13/24 Access Code: [email or web address] URL: [email or web address] Date: 01/13/2024 Prepared by: Damien Caulk  Exercises - Standing March with Counter Support  - 1 x daily - 5 x weekly - 2 sets - 10 reps - Standing Hip Abduction with Counter Support  - 1 x daily - 5 x weekly - 2 sets - 10 reps - Standing Single Leg Stance with Counter Support  - 1 x daily - 5 x weekly - 2 sets - 1 reps - 30 second hold - Supine Bridge  - 1 x daily - 5 x weekly - 2 sets - 10 reps - Clamshell  - 1 x daily - 5 x weekly - 2 sets - 10 reps  GOALS: Goals reviewed with patient? Yes  SHORT TERM GOALS: Target date: 01/20/2024 (assessed 01/20/24)  Patient will demo at least 50% compliance with HEP to self manage symptoms.  Baseline: Goal status: MET (pt reports 50% compliance)  2.  Assess FGA Baseline: 18/30 (12/26/23) Goal status: MET  3.  Assess TUG (Cognitive) Baseline: 26.25 seconds Goal status: MET   LONG TERM GOALS: Target date: 02/17/2024   Pt will decrease 5 Time Sit to Stand by at least 3 seconds in order to demonstrate clinically significant improvement in LE strength.  Baseline: 20.81 seconds (Eval) Goal status: INITIAL  2.  Pt will decrease TUG (Cognitive) by at least (or equal to) 3 seconds in order to demonstrate decreased fall risk. Baseline: 26.25 seconds  Goal status: INITIAL  3.  Patient will demo at least 5 points of improvement on FGA to improve  functional balance and reduce fall risk. Baseline: 18/30 (12/26/23) Goal status: INITIAL  4.  Pt will be independent with final HEP in order to improve strength and balance, to decrease fall risk, and improve function at home for ADL's and at work. Baseline:  Goal status: INITIAL   ASSESSMENT:  CLINICAL IMPRESSION: Patient was seen today for physical therapy treatment to address transfers and balance.  Focused session on transfers with use of weighted medicine ball on firm surface; progressed to transfers on compliant surface without use of added weight; also focused on improving foot clearance and step length via toe taps on 12 inch step and ambulation with use of ankle weights.  Patient continues to be limited by strength and balance.  They demonstrate improvement in transfer technique and balance on compliant surface with repetition and cueing.  They would continue to benefit from skilled PT to address impairments as noted and progress towards long term goals.  OBJECTIVE IMPAIRMENTS: Abnormal gait, cardiopulmonary status limiting activity, decreased activity tolerance, decreased balance, decreased cognition, decreased coordination, decreased endurance, decreased knowledge of condition, decreased knowledge of use of DME, decreased mobility, difficulty walking, decreased strength, decreased safety awareness, increased fascial restrictions, impaired flexibility, impaired sensation, impaired tone, impaired UE functional use, impaired vision/preception, improper body mechanics, and pain.   ACTIVITY LIMITATIONS: carrying, lifting, bending, standing, squatting, sleeping, stairs, transfers, locomotion level, and caring for others  PARTICIPATION LIMITATIONS: meal prep, cleaning, laundry, medication management, driving, shopping, community activity, occupation, and yard work  PERSONAL FACTORS: Age, Time since onset of injury/illness/exacerbation, and 1-2 comorbidities: CHF, hx of CVA, ESRD are also  affecting patient's functional outcome.   REHAB POTENTIAL: Good  CLINICAL DECISION MAKING: Evolving/moderate complexity  EVALUATION COMPLEXITY: Moderate  PLAN:  PT FREQUENCY: 2x/week  PT DURATION: 8 weeks  PLANNED INTERVENTIONS: [id number]- PT Re-evaluation, 97750- Physical Performance Testing, 97110-Therapeutic exercises, 97530- Therapeutic activity, 97112- Neuromuscular re-education, 97535- Self Care, [id number]- Manual therapy, [phone]- Gait training, [phone]- Orthotic/Prosthetic subsequent, [phone]- Aquatic Therapy, [phone]- Electrical stimulation (unattended), Patient/Family education, Balance training, Stair training, Taping, Joint mobilization, Spinal mobilization, Cognitive remediation, DME instructions, Wheelchair mobility training, Cryotherapy, and Moist heat  PLAN FOR NEXT SESSION: SLP referral (did pt call PCP to request)?; higher level balance with cognitive tasks/dual tasks; [employer]; challenge awareness of L side, L hip abd strengthening, quadruped, half kneel, turning   [employer], PT 01/27/2024, 9:29 AM

## 2024-01-27 NOTE — Therapy (Signed)
 OUTPATIENT OCCUPATIONAL THERAPY NEURO TREATMENT  Patient Name: Nathan Campbell MRN: [id number] DOB:05-09-1975, 48 y.o., male Today's Date: 01/27/2024  PCP: None REFERRING PROVIDER: Sebastian Jerilyn HERO, FNP  END OF SESSION:  OT End of Session - 01/27/24 0941     Visit Number 9    Number of Visits 16    Date for Recertification  02/22/24    Authorization Type [employer] [facility]    Authorization Time Period 12/30/23-02/24/24    Authorization - Visit Number 8    Authorization - Number of Visits 17    Progress Note Due on Visit 10    OT Start Time 0930    OT Stop Time 1015    OT Time Calculation (min) 45 min    Equipment Utilized During Treatment estim, ball, larger pegs and peg board    Activity Tolerance Patient tolerated treatment well    Behavior During Therapy WFL for tasks assessed/performed            Past Medical History:  Diagnosis Date   Acute combined systolic and diastolic CHF, NYHA class 4 (HCC) 06/10/2016   Anemia    Cardiomyopathy (HCC) 06/14/2016   CHF (congestive heart failure) (HCC)    COVID 10/2020   Dyspnea    ESRD on hemodialysis (HCC)    TTS at [facility]   Hypertension    Hypertensive heart and kidney disease with heart failure (HCC) 06/14/2016   Hypertensive heart disease with congestive heart failure (HCC)    Obesity    Past Surgical History:  Procedure Laterality Date   A/V FISTULAGRAM Left 05/03/2022   Procedure: A/V Fistulagram;  Surgeon: Eliza Lonni RAMAN, MD;  Location: [facility] INVASIVE CV LAB;  Service: Cardiovascular;  Laterality: Left;   AV FISTULA PLACEMENT Left 11/11/2020   Procedure: LEFT ARM First Stage Basilic Vein Fistula Creation;  Surgeon: Sheree Penne Lonni, MD;  Location: [facility] OR;  Service: Vascular;  Laterality: Left;   BASCILIC VEIN TRANSPOSITION Left 01/23/2021   Procedure: Second Stage Basilic Vein Transposition of Left Arm Arteriovenous  Fistula;  Surgeon: Sheree Penne Lonni, MD;  Location: [facility] OR;  Service: Vascular;   Laterality: Left;  Block  to LMA    COLONOSCOPY     FISTULA SUPERFICIALIZATION Left 05/07/2022   Procedure: PLICATION OF ANEURYSM, LEFT ARM FISTULA;  Surgeon: Eliza Lonni RAMAN, MD;  Location: [facility] OR;  Service: Vascular;  Laterality: Left;   IR CT HEAD LTD  10/07/2023   IR CT HEAD LTD  10/07/2023   IR PATIENT EVAL TECH 0-60 MINS  10/07/2023   IR PERCUTANEOUS ART THROMBECTOMY/INFUSION INTRACRANIAL INC DIAG ANGIO  10/07/2023   NO PAST SURGERIES     PERIPHERAL VASCULAR BALLOON ANGIOPLASTY Left 05/03/2022   Procedure: PERIPHERAL VASCULAR BALLOON ANGIOPLASTY;  Surgeon: Eliza Lonni RAMAN, MD;  Location: [facility] INVASIVE CV LAB;  Service: Cardiovascular;  Laterality: Left;  AVF   RADIOLOGY WITH ANESTHESIA N/A 10/07/2023   Procedure: RADIOLOGY WITH ANESTHESIA;  Surgeon: Radiologist, Medication, MD;  Location: [facility] OR;  Service: Radiology;  Laterality: N/A;   Patient Active Problem List   Diagnosis Date Noted   Stroke (HCC) 11/04/2023   Cognitive and neurobehavioral dysfunction 10/24/2023   Acute hypoxic respiratory failure (HCC) 10/17/2023   Essential hypertension 10/17/2023   Dysphagia 10/17/2023   Leukocytosis 10/17/2023   Obesity 10/17/2023   Acute ischemic stroke (HCC) 10/07/2023   Middle cerebral artery embolism, right 10/07/2023   ESRD (end stage renal disease) (HCC) 03/26/2022   Hyperlipidemia, unspecified 03/20/2021   Acute gout due to  renal impairment involving left wrist 04/24/2019   Malignant hypertension 10/14/2016   Combined congestive systolic and diastolic heart failure (HCC) 06/14/2016   NICM (nonischemic cardiomyopathy) (HCC) 06/14/2016   Renal failure    Hypertensive urgency 06/10/2016   CKD (chronic kidney disease), stage IV (HCC) 06/10/2016   Normocytic anemia 06/10/2016    ONSET DATE: 12/19/2023 referral date Admit date: 10/18/2023 Discharge date: 11/04/2023  REFERRING DIAG: G81.90 (ICD-10-CM) - Hemiplegia, unspecified affecting unspecified side  THERAPY DIAG:   Hemiplegia and hemiparesis following other cerebrovascular disease affecting left non-dominant side (HCC)  Muscle weakness (generalized)  Other lack of coordination  Rationale for Evaluation and Treatment: Rehabilitation  SUBJECTIVE:   SUBJECTIVE STATEMENT: Pt reported the glove has arrived and it's working well. He gets help getting it on from family.   Pt accompanied by: self  PERTINENT HISTORY: Pt is a 48 yo male presenting to this clinic with hemiplegia s/p R MCA embolism. Pt was hospitalized for this from 10/07/23-10/18/23, then discharged to neurology and received IP PT/OT/SLP 10/18/23-11/04/23. Initial CT head revealed right MCA territory ischemic changes with calcific density in proximal right M1 concerning for calcific embolus.   CHF, CM, ESRD on HD TTS, HTN, obesity, HLD  PRECAUTIONS: Fall, L sided weakness, blood thinners  WEIGHT BEARING RESTRICTIONS: No  PAIN:  Are you having pain? No  FALLS: Has patient fallen in last 6 months? No  LIVING ENVIRONMENT: Lives with: lives with their family sister and her 2 sons. Pt reports will be getting own place soon. Lives in: House/apartment townhouse, 2 level Stairs: Yes: Internal: 3 steps; on right going up Has following equipment at home: None  PLOF: Independent and Vocation/Vocational requirements: Worked in distribution in [employer]   PATIENT GOALS: Get back movement in my left arm, return to work, and be able to play video games like I was before.  OBJECTIVE:  Note: Objective measures were completed at Evaluation unless otherwise noted.  HAND DOMINANCE: Right  ADLs: Overall ADLs: Independent Transfers/ambulation related to ADLs: Independent Eating: Independent Grooming: Independent UB Dressing: Independent LB Dressing: Independent Toileting: Independent Bathing: Independent Tub Shower transfers: Independent, shower/tub combo Equipment: none  IADLs: Shopping: Independent Light housekeeping: Mom is doing  pharmacologist.  Meal Prep: Needs some help with opening cans and jars d/t weakness in L hand Community mobility: Mother driving pt to appts  Medication management: Mother helping with medication mgmt Financial management: Independent Handwriting: did not assess d/t pt report of no changes and pt dominant hand unaffected.   MOBILITY STATUS: Independent  POSTURE COMMENTS:  No Significant postural limitations Sitting balance: WFL  ACTIVITY TOLERANCE: Activity tolerance: No changes  FUNCTIONAL OUTCOME MEASURES: Quick Dash: 47.7/100: 47.7% impairment  UPPER EXTREMITY ROM:  R WNL  Active ROM Right eval Left eval  Shoulder flexion  92  Shoulder abduction  70  Wrist flexion  10  Wrist extension  40  Wrist pronation    Wrist supination  Impaired (re-assess)  (Blank rows = not tested)  UPPER EXTREMITY MMT:   WFL RUE, 3-/5 grossly for LUE  HAND FUNCTION: Grip strength: Right: 61.2 lbs; Left: 15.8 lbs 01/20/24: Left: 19.4 lbs  COORDINATION: 9 Hole Peg test: Right: 41.60 sec; Left: unable to pick up a peg in 2 minutes sec Box and Blocks:  Right 36 blocks, Left 11blocks 01/20/24: Left: 15 blocks   SENSATION: WFL  EDEMA: none  MUSCLE TONE: LUE: Rigidity  COGNITION: Overall cognitive status: Within functional limits for tasks assessed  VISION: Subjective report: no concerns  Baseline vision: No visual deficits Visual history: no hx  VISION ASSESSMENT: WFL To be further assessed in functional context  Patient has difficulty with following activities due to following visual impairments: none  PERCEPTION: WFL  PRAXIS: Impaired: Ideomotor  OBSERVATIONS: Weakness in L hand and limited ROM in LUE affecting ability to complete ADL/IADL.                                                                                                                            TREATMENT: 01/23/24     - Neuro re-education completed for duration as noted below including:  OT educated patient  on use of NMES unit as well as e-stim to encourage improved nerve function as needed to produce more active LUE ROM and increase functional use through muscular re-education. Therapist educated patient on application for L shoulder flexion and applied electrodes accordingly. Therapist utilized e-stim Russian waveform with 10/10 cycle time, 50% duty cycle, burst frequency of 50 bps, and ramp of 2 seconds. Patient tolerating up to 30 intensity with supervised application to promote improved AROM and pain management. Therapist cued patient to complete shoulder flexion activities during the NMES "on" cycle and shoulder extension during the "off" cycle to replicate functional reach and placement of the affected LUE. Functional activities incorporated included reaching for pegs of varying sizes at varying angles, grasping them for 10 seconds, and then releasing them on tabletop. Able to tolerate grading up to small golf tees, cues required for pinching.  Pt completed WB at wall completing wall pushups with OT facilitating scapula for proper movement. Pt next pushed up ball on wall engaging in B shoulder flexion. Verbal cues required for proper body placement so as to not complete scaption. Pt next completed wall abduction with washcloth and LUE. Cues required to relax shoulders.  Pt completed pegboard activity pinching pegs with L hand and placing on peg board. Mod to max verbal cues required to pinch pegs and place hand over top of peg for easier pinch, and to not try to insert pegs at an angle.   PATIENT EDUCATION: Education details: SEE ABOVE Person educated: Patient Education method: Explanation, Demonstration, Tactile cues, Verbal cues, and Handouts Education comprehension: verbalized understanding, returned demonstration, verbal cues required, and needs further education  HOME EXERCISE PROGRAM: 12/30/23: Shoulder, Forearm/wrist, and hand HEP  01/02/24: Theraputty HEP with red putty (ACCESS CODE  [email or web address]) 01/11/24: Shoulder shrug exercises (same access code as above) 01/16/24: E-stim and saebo glove [employer] 01/20/24: Jar/can opener [employer]   GOALS: Goals reviewed with patient? Yes  SHORT TERM GOALS: Target date: 01/23/24  Pt will be independent with LUE ROM, coordination, and grip strengthening HEP Baseline: New to OP OT Goal status: IN PROGRESS  2.  Pt will increase L shoulder abduction to at least 90 degrees for improved functional use  Baseline: 70 degrees  01/20/24: 90 degrees  Goal status:GOAL MET    3.  Pt will be independent with sleeping  strategies for hemiparesis and hemiplegia of LUE Baseline: New to OP OT Goal status: IN PROGRESS  4.  Pt will be independent with resting hand splint wear and care for night use and to improve functional use in L hand Baseline: New to OP OT Goal status: IN PROGRESS  5. Pt will be able to place at least 25 blocks using left hand with completion of Box and Blocks test.   Baseline: 15 blocks  Goal status: NEW    LONG TERM GOALS: Target date: 02/22/24  Pt will decrease QuickDASH score to no more than 25/100 to demonstrate improved function in daily tasks Baseline: 47.5/100 Goal status: IN PROGRESS  2.  Pt will increase L shoulder flexion ROM to at least 120 degrees for improved functional use Baseline: 92 degrees 01/20/24: 92 degrees  Goal status:IN PROGRESS  3.  Pt will increase L shoulder abduction ROM to at least 100 degrees for improved functional use Baseline: 70 degrees 01/20/24:  90 degrees  Goal status: IN PROGRESS  4.  Pt will increase L wrist flexion and extension by at least 40 degrees and 20 degrees, respectively. Baseline: 10 degrees wrist flexion, 40 degrees wrist extension 01/20/24: 32 degrees wrist flexion, 63 degrees wrist extension Goal status: IN PROGRESS 5. Patient will demonstrate at least 40 lbs L grip strength as needed to open jars and other containers.   Baseline: 19.2  Goal  status: NEW  ASSESSMENT:  CLINICAL IMPRESSION: Patient presents to tx with LUE weakness and impaired coordination secondary to CVA.  Pt required mod cues for proper form of FM activities, but demonstrating improved manipulation of smaller items and participates well with shoulder stretches and exercises with verbal and tactile cues for proper form. Pt would benefit from continued skilled services to improve ability to complete ADL/IADL and work tasks and promote improved function in LUE.   PERFORMANCE DEFICITS: in functional skills including ADLs, IADLs, coordination, dexterity, tone, ROM, strength, pain, Fine motor control, body mechanics, cardiopulmonary status limiting function, and UE functional use, cognitive skills including attention, safety awareness, and sequencing, and psychosocial skills including coping strategies, interpersonal interactions, and routines and behaviors.   IMPAIRMENTS: are limiting patient from IADLs, rest and sleep, work, and leisure.   CO-MORBIDITIES: has co-morbidities such as ESRD that affects occupational performance. Patient will benefit from skilled OT to address above impairments and improve overall function.  REHAB POTENTIAL: Good   PLAN:  OT FREQUENCY: 2x/week  OT DURATION: 8 weeks  PLANNED INTERVENTIONS: 97168 OT Re-evaluation, 97535 self care/ADL training, 02889 therapeutic exercise, 97530 therapeutic activity, 97112 neuromuscular re-education, 97140 manual therapy, 97032 electrical stimulation (manual), 97760 Orthotic Initial, [id number] Orthotic/Prosthetic subsequent, passive range of motion, functional mobility training, visual/perceptual remediation/compensation, energy conservation, patient/family education, and DME and/or AE instructions  RECOMMENDED OTHER SERVICES: none noted  CONSULTED AND AGREED WITH PLAN OF CARE: Patient  PLAN FOR NEXT SESSION:  Continue with E-stim- L shoulder, attempt L hand as needed Coordination/ strengthening  activities ROM activities WB activities  [employer], OT 01/27/2024, 10:23 AM

## 2024-01-28 ENCOUNTER — Other Ambulatory Visit: Payer: Self-pay

## 2024-01-28 ENCOUNTER — Ambulatory Visit
Admission: EM | Admit: 2024-01-28 | Discharge: 2024-01-28 | Disposition: A | Discharge status: Home / Self Care | Source: Ambulatory Visit

## 2024-01-28 DIAGNOSIS — J069 Acute upper respiratory infection, unspecified: Secondary | ICD-10-CM

## 2024-01-28 DIAGNOSIS — R051 Acute cough: Secondary | ICD-10-CM

## 2024-01-28 LAB — POC COVID19/FLU A&B COMBO
Covid Antigen, POC: NEGATIVE
Influenza A Antigen, POC: NEGATIVE
Influenza B Antigen, POC: NEGATIVE

## 2024-01-28 MED ORDER — BENZONATATE 100 MG PO CAPS
100.0000 mg | ORAL_CAPSULE | Freq: Three times a day (TID) | ORAL | 0 refills | Status: AC
Start: 2024-01-28 — End: ?

## 2024-01-28 NOTE — ED Triage Notes (Signed)
 Pt c/o productive cough w/green/yellow mucous started last night

## 2024-01-28 NOTE — ED Provider Notes (Signed)
 [facility]    CSN: [id number] Arrival date & time: 01/28/24  1143      History   Chief Complaint Chief Complaint  Patient presents with   Cough    HPI Nathan Campbell is a 48 y.o. male.   48 year old male presents to urgent care with complaints of cough, sore throat, congestion.  He reports this started last night.  He reports that his cough is productive with a greenish colored mucus.  He did try some tea but has not taken any other medication.  He denies fevers, body aches, shortness of breath, chest pain, ear pain or known sick exposures.   Cough Associated symptoms: sore throat   Associated symptoms: no chest pain, no chills, no ear pain, no fever, no rash and no shortness of breath     Past Medical History:  Diagnosis Date   Acute combined systolic and diastolic CHF, NYHA class 4 (HCC) 06/10/2016   Anemia    Cardiomyopathy (HCC) 06/14/2016   CHF (congestive heart failure) (HCC)    COVID 10/2020   Dyspnea    ESRD on hemodialysis (HCC)    TTS at [facility]   Hypertension    Hypertensive heart and kidney disease with heart failure (HCC) 06/14/2016   Hypertensive heart disease with congestive heart failure (HCC)    Obesity     Patient Active Problem List   Diagnosis Date Noted   Stroke (HCC) 11/04/2023   Cognitive and neurobehavioral dysfunction 10/24/2023   Acute hypoxic respiratory failure (HCC) 10/17/2023   Essential hypertension 10/17/2023   Dysphagia 10/17/2023   Leukocytosis 10/17/2023   Obesity 10/17/2023   Acute ischemic stroke (HCC) 10/07/2023   Middle cerebral artery embolism, right 10/07/2023   ESRD (end stage renal disease) (HCC) 03/26/2022   Hyperlipidemia, unspecified 03/20/2021   Acute gout due to renal impairment involving left wrist 04/24/2019   Malignant hypertension 10/14/2016   Combined congestive systolic and diastolic heart failure (HCC) 06/14/2016   NICM (nonischemic cardiomyopathy) (HCC) 06/14/2016   Renal failure     Hypertensive urgency 06/10/2016   CKD (chronic kidney disease), stage IV (HCC) 06/10/2016   Normocytic anemia 06/10/2016    Past Surgical History:  Procedure Laterality Date   A/V FISTULAGRAM Left 05/03/2022   Procedure: A/V Fistulagram;  Surgeon: Eliza Lonni RAMAN, MD;  Location: [facility] INVASIVE CV LAB;  Service: Cardiovascular;  Laterality: Left;   AV FISTULA PLACEMENT Left 11/11/2020   Procedure: LEFT ARM First Stage Basilic Vein Fistula Creation;  Surgeon: Sheree Penne Lonni, MD;  Location: [facility] OR;  Service: Vascular;  Laterality: Left;   BASCILIC VEIN TRANSPOSITION Left 01/23/2021   Procedure: Second Stage Basilic Vein Transposition of Left Arm Arteriovenous  Fistula;  Surgeon: Sheree Penne Lonni, MD;  Location: [facility] OR;  Service: Vascular;  Laterality: Left;  Block  to LMA    COLONOSCOPY     FISTULA SUPERFICIALIZATION Left 05/07/2022   Procedure: PLICATION OF ANEURYSM, LEFT ARM FISTULA;  Surgeon: Eliza Lonni RAMAN, MD;  Location: [facility] OR;  Service: Vascular;  Laterality: Left;   IR CT HEAD LTD  10/07/2023   IR CT HEAD LTD  10/07/2023   IR PATIENT EVAL TECH 0-60 MINS  10/07/2023   IR PERCUTANEOUS ART THROMBECTOMY/INFUSION INTRACRANIAL INC DIAG ANGIO  10/07/2023   NO PAST SURGERIES     PERIPHERAL VASCULAR BALLOON ANGIOPLASTY Left 05/03/2022   Procedure: PERIPHERAL VASCULAR BALLOON ANGIOPLASTY;  Surgeon: Eliza Lonni RAMAN, MD;  Location: [facility] INVASIVE CV LAB;  Service: Cardiovascular;  Laterality: Left;  AVF   RADIOLOGY WITH ANESTHESIA N/A 10/07/2023   Procedure: RADIOLOGY WITH ANESTHESIA;  Surgeon: Radiologist, Medication, MD;  Location: [facility] OR;  Service: Radiology;  Laterality: N/A;       Home Medications    Prior to Admission medications   Medication Sig Start Date End Date Taking? Authorizing Provider  AURYXIA  1 GM 210 MG(Fe) tablet Take 210 mg by mouth 3 (three) times daily with meals. 01/26/24  Yes [provider]  benzonatate  (TESSALON ) 100 MG capsule  Take 1 capsule (100 mg total) by mouth every 8 (eight) hours. 01/28/24  Yes Beldon Nowling A, PA-C  amLODipine  (NORVASC ) 10 MG tablet Place 1 tablet (10 mg total) into feeding tube daily. 10/18/23   Waddell Karna LABOR, NP  aspirin  81 MG chewable tablet Chew 1 tablet (81 mg total) by mouth daily. 11/04/23   Jerilynn Daphne SAILOR, NP  atorvastatin  (LIPITOR) 40 MG tablet Take 1 tablet (40 mg total) by mouth daily. 11/04/23   Jerilynn Daphne SAILOR, NP  camphor-menthol  [facility]) lotion Apply topically as needed for itching. 11/03/23   Jerilynn Daphne SAILOR, NP  carvedilol  (COREG ) 6.25 MG tablet Take 1 tablet (6.25 mg total) by mouth 2 (two) times daily. 11/03/23   Jerilynn Daphne SAILOR, NP  clopidogrel  (PLAVIX ) 75 MG tablet Take 1 tablet (75 mg total) by mouth daily. 11/04/23   Jerilynn Daphne SAILOR, NP  cyclobenzaprine  (FLEXERIL ) 5 MG tablet Take 1 tablet (5 mg total) by mouth 3 (three) times daily as needed for muscle spasms. 11/03/23   Jerilynn Daphne SAILOR, NP  hydrOXYzine  (ATARAX ) 10 MG tablet Take 1 tablet (10 mg total) by mouth 3 (three) times daily as needed for itching. 11/03/23   Jerilynn Daphne SAILOR, NP  insulin  aspart (NOVOLOG ) 100 UNIT/ML injection Inject 0-6 Units into the skin every 4 (four) hours. 10/18/23   Waddell Karna LABOR, NP  isosorbide  dinitrate (ISORDIL ) 10 MG tablet Place 0.5 tablets (5 mg total) into feeding tube 2 (two) times daily. 10/18/23   Waddell Karna LABOR, NP  lanthanum  (FOSRENOL ) 750 MG chewable tablet Chew 2 tablets (1,500 mg total) by mouth 3 (three) times daily with meals. 11/03/23   Jerilynn Daphne SAILOR, NP  midodrine  (PROAMATINE ) 5 MG tablet Take 1 tablet (5 mg total) by mouth 2 (two) times daily with breakfast and lunch. 11/04/23   Jerilynn Daphne SAILOR, NP  ondansetron  (ZOFRAN ) 4 MG tablet Take 1 tablet (4 mg total) by mouth every 6 (six) hours as needed for nausea or vomiting. 11/03/23   Jerilynn Daphne SAILOR, NP  pantoprazole  (PROTONIX ) 40 MG tablet Take 1 tablet (40 mg total) by mouth at bedtime. 11/03/23    Jerilynn Daphne SAILOR, NP  senna-docusate (SENOKOT-S) 8.6-50 MG tablet Take 2 tablets by mouth at bedtime. 11/03/23   Jerilynn Daphne SAILOR, NP  witch hazel-glycerin  (TUCKS) pad Apply topically as needed for hemorrhoids. 11/03/23   Jerilynn Daphne SAILOR, NP    Family History Family History  Problem Relation Age of Onset   Hypertension Mother    Diabetes Father    Hypertension Father    Hypertension Sister    Hypertension Paternal Grandmother    Diabetes Mellitus II Paternal Grandfather    Heart disease Cousin    Heart disease Cousin    Colon cancer Cousin    Esophageal cancer Neg Hx    Rectal cancer Neg Hx    Stomach cancer Neg Hx     Social History Social History   Tobacco Use   Smoking status: Former  Current packs/day: 0.00    Types: Cigarettes    Quit date: 09/2015    Years since quitting: 8.3   Smokeless tobacco: Never   Tobacco comments:    occasionally  Vaping Use   Vaping status: Former  Substance Use Topics   Alcohol use: Not Currently   Drug use: Not Currently     Allergies   Patient has no known allergies.   Review of Systems Review of Systems  Constitutional:  Negative for chills and fever.  HENT:  Positive for congestion and sore throat. Negative for ear pain.   Eyes:  Negative for pain and visual disturbance.  Respiratory:  Positive for cough. Negative for shortness of breath.   Cardiovascular:  Negative for chest pain and palpitations.  Gastrointestinal:  Negative for abdominal pain and vomiting.  Genitourinary:  Negative for dysuria and hematuria.  Musculoskeletal:  Negative for arthralgias and back pain.  Skin:  Negative for color change and rash.  Neurological:  Negative for seizures and syncope.  All other systems reviewed and are negative.    Physical Exam Triage Vital Signs ED Triage Vitals  Encounter Vitals Group     BP 01/28/24 1224 (!) 143/93     Girls Systolic BP Percentile --      Girls Diastolic BP Percentile --      Boys Systolic  BP Percentile --      Boys Diastolic BP Percentile --      Pulse Rate 01/28/24 1224 (!) 103     Resp 01/28/24 1224 18     Temp 01/28/24 1224 99.3 F (37.4 C)     Temp Source 01/28/24 1224 Oral     SpO2 01/28/24 1224 92 %     Weight --      Height --      Head Circumference --      Peak Flow --      Pain Score 01/28/24 1220 0     Pain Loc --      Pain Education --      Exclude from Growth Chart --    No data found.  Updated Vital Signs BP (!) 143/93   Pulse (!) 103   Temp 99.3 F (37.4 C) (Oral)   Resp 18   SpO2 92%   Visual Acuity Right Eye Distance:   Left Eye Distance:   Bilateral Distance:    Right Eye Near:   Left Eye Near:    Bilateral Near:     Physical Exam Vitals and nursing note reviewed.  Constitutional:      General: He is not in acute distress.    Appearance: He is well-developed.  HENT:     Head: Normocephalic and atraumatic.     Right Ear: Tympanic membrane normal.     Left Ear: Tympanic membrane normal.     Nose: Congestion present.     Mouth/Throat:     Mouth: Mucous membranes are moist.     Pharynx: Posterior oropharyngeal erythema (Very mild) present.  Eyes:     Conjunctiva/sclera: Conjunctivae normal.  Cardiovascular:     Rate and Rhythm: Normal rate and regular rhythm.     Heart sounds: No murmur heard. Pulmonary:     Effort: Pulmonary effort is normal. No tachypnea or respiratory distress.     Breath sounds: Normal breath sounds. No decreased breath sounds or wheezing.  Abdominal:     Palpations: Abdomen is soft.     Tenderness: There is no abdominal tenderness.  Musculoskeletal:  General: No swelling.     Cervical back: Neck supple.  Skin:    General: Skin is warm and dry.     Capillary Refill: Capillary refill takes less than 2 seconds.  Neurological:     Mental Status: He is alert.  Psychiatric:        Mood and Affect: Mood normal.      UC Treatments / Results  Labs (all labs ordered are listed, but only  abnormal results are displayed) Labs Reviewed  POC COVID19/FLU A&B COMBO    EKG   Radiology No results found.  Procedures Procedures (including critical care time)  Medications Ordered in UC Medications - No data to display  Initial Impression / Assessment and Plan / UC Course  I have reviewed the triage vital signs and the nursing notes.  Pertinent labs & imaging results that were available during my care of the patient were reviewed by me and considered in my medical decision making (see chart for details).     Viral upper respiratory tract infection with cough  Acute cough - Plan: POC Covid19/Flu A&B Antigen, POC Covid19/Flu A&B Antigen   Flu A, flu B and COVID testing done today. Symptoms are most consistent with a viral infection.  This does not require antibiotic treatment.  We focus treatment on improving the symptoms.  We will treat with the following:  Benzonatate  (tessalon ) 100 mg every 8 hours as needed for cough.   Can alternate Tylenol  and ibuprofen  for pain or fever Make sure to stay hydrated by drinking plenty of water . Return to urgent care or PCP if symptoms worsen or fail to resolve.    Final Clinical Impressions(s) / UC Diagnoses   Final diagnoses:  Acute cough  Viral upper respiratory tract infection with cough     Discharge Instructions      Flu A, flu B and COVID testing done today. Symptoms are most consistent with a viral infection.  This does not require antibiotic treatment.  We focus treatment on improving the symptoms.  We will treat with the following:  Benzonatate  (tessalon ) 100 mg every 8 hours as needed for cough.   Can alternate Tylenol  and ibuprofen  for pain or fever Make sure to stay hydrated by drinking plenty of water . Return to urgent care or PCP if symptoms worsen or fail to resolve.       ED Prescriptions     Medication Sig Dispense Auth. Provider   benzonatate  (TESSALON ) 100 MG capsule Take 1 capsule (100 mg total) by  mouth every 8 (eight) hours. 21 capsule Teresa Almarie LABOR, [place]      PDMP not reviewed this encounter.   Teresa Almarie LABOR, PA-C 01/28/24 1309

## 2024-01-28 NOTE — Discharge Instructions (Addendum)
 Flu A, flu B and COVID testing done today. Symptoms are most consistent with a viral infection.  This does not require antibiotic treatment.  We focus treatment on improving the symptoms.  We will treat with the following:  Benzonatate  (tessalon ) 100 mg every 8 hours as needed for cough.   Can alternate Tylenol  and ibuprofen  for pain or fever Make sure to stay hydrated by drinking plenty of water . Return to urgent care or PCP if symptoms worsen or fail to resolve.

## 2024-01-30 ENCOUNTER — Encounter: Payer: Self-pay | Admitting: Physical Therapy

## 2024-01-30 ENCOUNTER — Ambulatory Visit

## 2024-01-30 ENCOUNTER — Ambulatory Visit: Admitting: Physical Therapy

## 2024-01-30 VITALS — BP 125/90 | HR 83

## 2024-01-30 DIAGNOSIS — R29898 Other symptoms and signs involving the musculoskeletal system: Secondary | ICD-10-CM

## 2024-01-30 DIAGNOSIS — R2689 Other abnormalities of gait and mobility: Secondary | ICD-10-CM

## 2024-01-30 DIAGNOSIS — M6281 Muscle weakness (generalized): Secondary | ICD-10-CM

## 2024-01-30 DIAGNOSIS — R278 Other lack of coordination: Secondary | ICD-10-CM

## 2024-01-30 DIAGNOSIS — I69854 Hemiplegia and hemiparesis following other cerebrovascular disease affecting left non-dominant side: Secondary | ICD-10-CM

## 2024-01-30 NOTE — Therapy (Signed)
 OUTPATIENT PHYSICAL THERAPY NEURO TREATMENT   Patient Name: Nathan Campbell MRN: [id number] DOB:07/18/1975, 48 y.o., male Today's Date: 01/30/2024   PCP: Pt reports PCP is [facility] but did not give any further identifying information. REFERRING PROVIDER: Sebastian Jerilyn HERO, FNP  END OF SESSION:  PT End of Session - 01/30/24 0945     Visit Number 12    Number of Visits 17   16 plus Eval   Date for Recertification  02/24/24   For scheduling delays   Authorization Type [facility] Medicare    Authorization Time Period 12/26/23 - 02/20/24    Authorization - Visit Number 12    Authorization - Number of Visits 17    Progress Note Due on Visit 10    PT Start Time [phone]   pt arrived late   PT Stop Time 1015    PT Time Calculation (min) 32 min    Equipment Utilized During Treatment Gait belt    Activity Tolerance Patient tolerated treatment well    Behavior During Therapy WFL for tasks assessed/performed           Past Medical History:  Diagnosis Date   Acute combined systolic and diastolic CHF, NYHA class 4 (HCC) 06/10/2016   Anemia    Cardiomyopathy (HCC) 06/14/2016   CHF (congestive heart failure) (HCC)    COVID 10/2020   Dyspnea    ESRD on hemodialysis (HCC)    TTS at [facility]   Hypertension    Hypertensive heart and kidney disease with heart failure (HCC) 06/14/2016   Hypertensive heart disease with congestive heart failure (HCC)    Obesity    Past Surgical History:  Procedure Laterality Date   A/V FISTULAGRAM Left 05/03/2022   Procedure: A/V Fistulagram;  Surgeon: Eliza Lonni RAMAN, MD;  Location: [facility] INVASIVE CV LAB;  Service: Cardiovascular;  Laterality: Left;   AV FISTULA PLACEMENT Left 11/11/2020   Procedure: LEFT ARM First Stage Basilic Vein Fistula Creation;  Surgeon: Sheree Penne Lonni, MD;  Location: [facility] OR;  Service: Vascular;  Laterality: Left;   BASCILIC VEIN TRANSPOSITION Left 01/23/2021   Procedure: Second Stage Basilic Vein Transposition of Left  Arm Arteriovenous  Fistula;  Surgeon: Sheree Penne Lonni, MD;  Location: [facility] OR;  Service: Vascular;  Laterality: Left;  Block  to LMA    COLONOSCOPY     FISTULA SUPERFICIALIZATION Left 05/07/2022   Procedure: PLICATION OF ANEURYSM, LEFT ARM FISTULA;  Surgeon: Eliza Lonni RAMAN, MD;  Location: [facility] OR;  Service: Vascular;  Laterality: Left;   IR CT HEAD LTD  10/07/2023   IR CT HEAD LTD  10/07/2023   IR PATIENT EVAL TECH 0-60 MINS  10/07/2023   IR PERCUTANEOUS ART THROMBECTOMY/INFUSION INTRACRANIAL INC DIAG ANGIO  10/07/2023   NO PAST SURGERIES     PERIPHERAL VASCULAR BALLOON ANGIOPLASTY Left 05/03/2022   Procedure: PERIPHERAL VASCULAR BALLOON ANGIOPLASTY;  Surgeon: Eliza Lonni RAMAN, MD;  Location: [facility] INVASIVE CV LAB;  Service: Cardiovascular;  Laterality: Left;  AVF   RADIOLOGY WITH ANESTHESIA N/A 10/07/2023   Procedure: RADIOLOGY WITH ANESTHESIA;  Surgeon: Radiologist, Medication, MD;  Location: [facility] OR;  Service: Radiology;  Laterality: N/A;   Patient Active Problem List   Diagnosis Date Noted   Stroke (HCC) 11/04/2023   Cognitive and neurobehavioral dysfunction 10/24/2023   Acute hypoxic respiratory failure (HCC) 10/17/2023   Essential hypertension 10/17/2023   Dysphagia 10/17/2023   Leukocytosis 10/17/2023   Obesity 10/17/2023   Acute ischemic stroke (HCC) 10/07/2023   Middle cerebral artery embolism,  right 10/07/2023   ESRD (end stage renal disease) (HCC) 03/26/2022   Hyperlipidemia, unspecified 03/20/2021   Acute gout due to renal impairment involving left wrist 04/24/2019   Malignant hypertension 10/14/2016   Combined congestive systolic and diastolic heart failure (HCC) 06/14/2016   NICM (nonischemic cardiomyopathy) (HCC) 06/14/2016   Renal failure    Hypertensive urgency 06/10/2016   CKD (chronic kidney disease), stage IV (HCC) 06/10/2016   Normocytic anemia 06/10/2016    ONSET DATE: 12/14/2023- date of referral  REFERRING DIAG: G81.90 (ICD-10-CM) - Hemiparesis  (HCC)  THERAPY DIAG:  Hemiplegia and hemiparesis following other cerebrovascular disease affecting left non-dominant side (HCC)  Muscle weakness (generalized)  Other lack of coordination  Other abnormalities of gait and mobility  Rationale for Evaluation and Treatment: Rehabilitation  SUBJECTIVE:  Likes to be called LC.                                                                                                                                                                                            SUBJECTIVE STATEMENT: Pt reports having a cold but feels good and wants to participate in therapy.  Pt asking for mask to wear (pt given mask to wear during session).  No recent falls.  No pain reported.  Per notes went to Urgent Care 01/28/24 (negative for flu and Covid).  Pt reports not calling PCP regarding SLP referral d/t getting sick. Pt accompanied by: self.  Mom drove pt.  PERTINENT HISTORY: Hospitalization 10/07/23 for acute onset of left-sided weakness, left side neglect, dysarthria and right gaze preference; imaging showing acute right MCA CVA with right M1 occlusion (intubated for airway protection; s/p TNK; s/p revascularization of R MCA with IR 7/25).  IP rehab stay 10/18/23-11/04/23. Urgent care visit 12/02/23 for gout (L foot).  PMH includes: gout, CKD, ESRD on HD T/R/Sat, CHF, anemia, htn, stroke 10/07/23, acute hypoxic respiratory failure, dysphagia, cardiomyopathy, and obesity (BMI 41).  PAIN:  Are you having pain? Yes: NPRS scale: 0/10 currently Pain location: L shoulder Pain description: Aching Aggravating factors: Laying down Relieving factors: Muscle relaxer  PRECAUTIONS: Fall; fistula in LUE (dialysis T/Th/Sat)  RED FLAGS: None   WEIGHT BEARING RESTRICTIONS: No  FALLS: Has patient fallen in last 6 months? No  LIVING ENVIRONMENT: Lives with: Sister and her 2 sons; mom currently staying with pt to assist as needed Lives in: Other Townhouse--stays on 2nd  floor Stairs: Yes: Internal: 5 steps; on right going up and External: 0 steps; none Has following equipment at home: Crutches  PLOF: Independent  PATIENT GOALS: To get more function in arm; get voice back; get strength up.  OBJECTIVE:  Note: Objective measures were completed at Evaluation unless otherwise noted.  COGNITION: Overall cognitive status: A&Ox4; possible decreased awareness of deficits  COORDINATION:  Rapid Alternating toe taps (in sitting): Decreased L LE  Heel to shin (in sitting); mild decreased L LE  LOWER EXTREMITY ROM:     Active  Right Eval Left Eval  Hip flexion WNL WNL  Hip extension    Hip abduction    Hip adduction    Hip internal rotation    Hip external rotation    Knee flexion WNL WNL  Knee extension WNL WNL  Ankle dorsiflexion WNL WNL  Ankle plantarflexion WNL WNL  Ankle inversion    Ankle eversion     (Blank rows = not tested)  LOWER EXTREMITY MMT:    MMT Right Eval Left Eval  Hip flexion 5/5 4+/5  Hip extension    Hip abduction    Hip adduction    Hip internal rotation    Hip external rotation    Knee flexion 5/5 4+/5  Knee extension 5/5 5/5  Ankle dorsiflexion 5/5 4+/5  Ankle plantarflexion At least 3/5 AROM At least 3/5 AROM  Ankle inversion    Ankle eversion    (Blank rows = not tested) GAIT: Findings: Gait Characteristics: step through pattern, decreased arm swing- Left, decreased ankle dorsiflexion- Left, and wide BOS, Distance walked: clinic distances, Assistive device utilized:None, Level of assistance: SBA, and Comments: pt tending to walk close to items on L side but did not bump into anything  FUNCTIONAL TESTS:  5 times sit to stand: 20.81 seconds 10 meter walk test: 1.25 m/sec (7.97 seconds); no AD use Functional gait assessment: 18/30 (12/26/23) SLS: R LE 1st 2 trials about 1 second each but on 3rd trial 9.0 seconds; L LE 1st 2 trials about 1 second each but on 3rd trial 30 seconds L LE (time stopped by therapist at  30 seconds) TUG (Manual): 9.41 seconds 12/26/23 TUG (Cognitive): 26.25 seconds 12/26/23  FUNCTIONAL GAIT ASSESSMENT  Date: 12/26/23 Score  GAIT LEVEL SURFACE Instructions: Walk at your normal speed from here to the next mark (6 m) [20 ft]. (1) Moderate impairment-Walks 6 m (20 ft), slow speed, abnormal gait pattern, evidence for imbalance, or deviates 25.4 - 38.1 cm (10 -15 in) outside of the 30.48-cm (12-in) walkway width. Requires more than 7 seconds to ambulate 6 m (20 ft). 8.53 seconds  2.   CHANGE IN GAIT SPEED Instructions: Begin walking at your normal pace (for 1.5 m [5 ft]). When I tell you "go," walk as fast as you can (for 1.5 m [5 ft]). When I tell you "slow," walk as slowly as you can (for 1.5 m [5 ft]. (2) Mild impairment - Is able to change speed but demonstrates mild gait deviations, deviates 15.24 -25.4 cm (6 -10 in) outside of the 30.48-cm (12-in) walkway width, or no gait deviations but unable to achieve a significant change in velocity, or uses an assistive device  3.    GAIT WITH HORIZONTAL HEAD TURNS Instructions: Walk from here to the next mark 6 m (20 ft) away. Begin walking at your normal pace. Keep walking straight; after 3 steps, turn your head to the right and keep walking straight while looking to the right. After 3 more steps, turn your head to the left and keep walking straight while looking left. Continue alternating looking right and left. (2) Mild impairment - Performs head turns smoothly with slight change in gait velocity (eg, minor disruption to  smooth gait path), deviates 15.24 -25.4 cm (6 -10 in) outside 30.48-cm (12-in) walkway width, or uses an assistive device.  4.   GAIT WITH VERTICAL HEAD TURNS Instructions: Walk from here to the next mark (6 m [20 ft]). Begin walking at your normal pace. Keep walking straight; after 3 steps, tip your head up and keep walking straight while looking up. After 3 more steps, tip your head down, keep walking straight while looking  down. Continue  alternating looking up and down every 3 steps until you have completed 2 repetitions in each direction. (2) Mild impairment - Performs task with slight change in gait velocity (eg, minor disruption to smooth gait path), deviates 15.24 -25.4 cm (6 -10 in) outside 30.48-cm (12-in) walkway width or uses assistive device.  5.  GAIT AND PIVOT TURN Instructions: Begin with walking at your normal pace. When I tell you, "turn and stop," turn as quickly as you can to face the opposite direction and stop. (2) Mild impairment - Pivot turns safely in 3 seconds and stops with no loss of balance, or pivot turns safely within 3 seconds and stops with mild imbalance, requires small steps to catch balance  6.   STEP OVER OBSTACLE Instructions: Begin walking at your normal speed. When you come to the shoe box, step over it, not around it, and keep walking. (2) Mild impairment - Is able to step over one shoe box (11.43 cm [4.5 in] total height) without changing gait speed; no evidence of imbalance.  7.   GAIT WITH NARROW BASE OF SUPPORT Instructions: Walk on the floor with arms folded across the chest, feet aligned heel to toe in tandem for a distance of 3.6 m [12 ft]. The number of steps taken in a straight line are counted for a maximum of 10 steps. (2) Mild impairment - Ambulates 7-9 steps  8.   GAIT WITH EYES CLOSED Instructions: Walk at your normal speed from here to the next mark (6 m [20 ft]) with your eyes closed. (2) Mild impairment - Walks 6 m (20 ft), uses assistive device, slower speed, mild gait deviations, deviates 15.24 -25.4 cm (6 -10 in) outside 30.48-cm (12-in) walkway width. Ambulates 6 m (20 ft) in less than 9 seconds but greater than 7 seconds 7.72 seconds  9.   AMBULATING BACKWARDS Instructions: Walk backwards until I tell you to stop (2) Mild impairment - Walks 6 m (20 ft), uses assistive device, slower speed, mild gait deviations, deviates 15.24 -25.4 cm (6 -10 in) outside 30.48-cm  (12-in) walkway width12.06 seconds  10. STEPS Instructions: Walk up these stairs as you would at home (ie, using the rail if necessary). At the top turn around and walk down. (1) Moderate impairment-Two feet to a stair; must use rail.  Total 18/30   Interpretation of scores: Non-Specific Older Adults Cutoff Score: <=22/30 = risk of falls Parkinson's Disease Cutoff score <15/30= fall risk (Hoehn & Yahr 1-4)  Minimally Clinically Important Difference (MCID)  Stroke (acute, subacute, and chronic) = MDC: 4.2 points Vestibular (acute) = MDC: 6 points Community Dwelling Older Adults =  MCID: 4 points Parkinson's Disease  =  MDC: 4.3 points  (Academy of Neurologic Physical Therapy (nd). Functional Gait Assessment. Retrieved from https://www.neuropt.org/docs/default-source/cpgs/core-outcome-measures/function-gait-assessment-pocket-guide-proof9-(2).pdf?sfvrsn=b20f35043_0.)   PATIENT SURVEYS:  Stroke Impact Scale : TBA  TREATMENT DATE: 01/30/24  Self Care: BP and HR taken in sitting at rest beginning of session (see below for details). Vitals:   01/30/24 0951  BP: (!) 125/90  Pulse: 83  HR 92 bpm end of session post activity.  Therapeutic Activity: 1/2 kneeling on red mat on floor x10 reps kneeling on L LE x10 reps 4# medicine ball push-outs (from chest level) with B UE's; x10 reps with 4# medicine ball dynamic movement bringing ball from L knee to R shoulder with trunk rotation; CGA (intermittent unsteadiness noted with fatigue) x10 reps kneeling on R LE x10 reps 4# medicine ball push-outs (from chest level) with B UE's; x10 reps with 4# medicine ball dynamic movement bringing ball from R knee to L shoulder with trunk rotation; CGA (intermittent unsteadiness noted with fatigue) Ambulation: x274 feet with no AD use and B 3# ankle weights; SBA   PATIENT  EDUCATION: Education details: Pt to follow-up with PCP regarding SLP referral.  Continue HEP. Person educated: Patient Education method: Explanation, Demonstration, and Verbal cues Education comprehension: verbalized understanding  HOME EXERCISE PROGRAM: 01/13/24 Access Code: [email or web address] URL: [email or web address] Date: 01/13/2024 Prepared by: Damien Caulk  Exercises - Standing March with Counter Support  - 1 x daily - 5 x weekly - 2 sets - 10 reps - Standing Hip Abduction with Counter Support  - 1 x daily - 5 x weekly - 2 sets - 10 reps - Standing Single Leg Stance with Counter Support  - 1 x daily - 5 x weekly - 2 sets - 1 reps - 30 second hold - Supine Bridge  - 1 x daily - 5 x weekly - 2 sets - 10 reps - Clamshell  - 1 x daily - 5 x weekly - 2 sets - 10 reps  GOALS: Goals reviewed with patient? Yes  SHORT TERM GOALS: Target date: 01/20/2024 (assessed 01/20/24)  Patient will demo at least 50% compliance with HEP to self manage symptoms.  Baseline: Goal status: MET (pt reports 50% compliance)  2.  Assess FGA Baseline: 18/30 (12/26/23) Goal status: MET  3.  Assess TUG (Cognitive) Baseline: 26.25 seconds Goal status: MET   LONG TERM GOALS: Target date: 02/17/2024   Pt will decrease 5 Time Sit to Stand by at least 3 seconds in order to demonstrate clinically significant improvement in LE strength.  Baseline: 20.81 seconds (Eval) Goal status: INITIAL  2.  Pt will decrease TUG (Cognitive) by at least (or equal to) 3 seconds in order to demonstrate decreased fall risk. Baseline: 26.25 seconds  Goal status: INITIAL  3.  Patient will demo at least 5 points of improvement on FGA to improve functional balance and reduce fall risk. Baseline: 18/30 (12/26/23) Goal status: INITIAL  4.  Pt will be independent with final HEP in order to improve strength and balance, to decrease fall risk, and improve function at home for ADL's and at work. Baseline:  Goal status:  INITIAL   ASSESSMENT:  CLINICAL IMPRESSION: Patient was seen today for physical therapy treatment to address balance and gait.  Focused session on 1/2 kneeling activity to improve core and hip stability with dynamic UE movement; also worked on improving foot clearance via ambulation with B ankle weights.  Patient continues to be limited by strength and balance.  They demonstrate improvement in core and hip activation/stability with cueing.  They would continue to benefit from skilled PT to address impairments as noted and progress towards long term goals.  OBJECTIVE IMPAIRMENTS: Abnormal gait, cardiopulmonary status  limiting activity, decreased activity tolerance, decreased balance, decreased cognition, decreased coordination, decreased endurance, decreased knowledge of condition, decreased knowledge of use of DME, decreased mobility, difficulty walking, decreased strength, decreased safety awareness, increased fascial restrictions, impaired flexibility, impaired sensation, impaired tone, impaired UE functional use, impaired vision/preception, improper body mechanics, and pain.   ACTIVITY LIMITATIONS: carrying, lifting, bending, standing, squatting, sleeping, stairs, transfers, locomotion level, and caring for others  PARTICIPATION LIMITATIONS: meal prep, cleaning, laundry, medication management, driving, shopping, community activity, occupation, and yard work  PERSONAL FACTORS: Age, Time since onset of injury/illness/exacerbation, and 1-2 comorbidities: CHF, hx of CVA, ESRD are also affecting patient's functional outcome.   REHAB POTENTIAL: Good  CLINICAL DECISION MAKING: Evolving/moderate complexity  EVALUATION COMPLEXITY: Moderate  PLAN:  PT FREQUENCY: 2x/week  PT DURATION: 8 weeks  PLANNED INTERVENTIONS: [id number]- PT Re-evaluation, 97750- Physical Performance Testing, 97110-Therapeutic exercises, 97530- Therapeutic activity, 97112- Neuromuscular re-education, 97535- Self Care, [id number]-  Manual therapy, [phone]- Gait training, [phone]- Orthotic/Prosthetic subsequent, [phone]- Aquatic Therapy, [phone]- Electrical stimulation (unattended), Patient/Family education, Balance training, Stair training, Taping, Joint mobilization, Spinal mobilization, Cognitive remediation, DME instructions, Wheelchair mobility training, Cryotherapy, and Moist heat  PLAN FOR NEXT SESSION: SLP referral (did pt call PCP to request)?; higher level balance with cognitive tasks/dual tasks; [employer]; challenge awareness of L side, L hip abd strengthening, quadruped, half kneel, turning   Damien Caulk, PT 01/30/2024, 7:46 PM

## 2024-01-30 NOTE — Therapy (Signed)
 OUTPATIENT OCCUPATIONAL THERAPY NEURO TREATMENT AND PROGRESS NOTE  Patient Name: Nathan Campbell MRN: [id number] DOB:[birth year], 48 y.o., male Today's Date: 01/30/2024  PCP: None REFERRING PROVIDER: Sebastian Jerilyn HERO, FNP  END OF SESSION:  OT End of Session - 01/30/24 1035     Authorization Type [employer] MCR    Authorization Time Period 12/30/23-02/24/24    OT Start Time 1015    OT Stop Time 1100    OT Time Calculation (min) 45 min    Activity Tolerance Patient limited by lethargy   Pt reports not sleeping well last night and has recently developed a cold   Behavior During Therapy Flat affect            Past Medical History:  Diagnosis Date   Acute combined systolic and diastolic CHF, NYHA class 4 (HCC) 06/10/2016   Anemia    Cardiomyopathy (HCC) 06/14/2016   CHF (congestive heart failure) (HCC)    COVID 10/2020   Dyspnea    ESRD on hemodialysis (HCC)    TTS at [facility]   Hypertension    Hypertensive heart and kidney disease with heart failure (HCC) 06/14/2016   Hypertensive heart disease with congestive heart failure (HCC)    Obesity    Past Surgical History:  Procedure Laterality Date   A/V FISTULAGRAM Left 05/03/2022   Procedure: A/V Fistulagram;  Surgeon: Eliza Lonni RAMAN, MD;  Location: [facility] INVASIVE CV LAB;  Service: Cardiovascular;  Laterality: Left;   AV FISTULA PLACEMENT Left 11/11/2020   Procedure: LEFT ARM First Stage Basilic Vein Fistula Creation;  Surgeon: Sheree Penne Lonni, MD;  Location: [facility] OR;  Service: Vascular;  Laterality: Left;   BASCILIC VEIN TRANSPOSITION Left 01/23/2021   Procedure: Second Stage Basilic Vein Transposition of Left Arm Arteriovenous  Fistula;  Surgeon: Sheree Penne Lonni, MD;  Location: [facility] OR;  Service: Vascular;  Laterality: Left;  Block  to LMA    COLONOSCOPY     FISTULA SUPERFICIALIZATION Left 05/07/2022   Procedure: PLICATION OF ANEURYSM, LEFT ARM FISTULA;  Surgeon: Eliza Lonni RAMAN, MD;  Location: [facility]  OR;  Service: Vascular;  Laterality: Left;   IR CT HEAD LTD  10/07/2023   IR CT HEAD LTD  10/07/2023   IR PATIENT EVAL TECH 0-60 MINS  10/07/2023   IR PERCUTANEOUS ART THROMBECTOMY/INFUSION INTRACRANIAL INC DIAG ANGIO  10/07/2023   NO PAST SURGERIES     PERIPHERAL VASCULAR BALLOON ANGIOPLASTY Left 05/03/2022   Procedure: PERIPHERAL VASCULAR BALLOON ANGIOPLASTY;  Surgeon: Eliza Lonni RAMAN, MD;  Location: [facility] INVASIVE CV LAB;  Service: Cardiovascular;  Laterality: Left;  AVF   RADIOLOGY WITH ANESTHESIA N/A 10/07/2023   Procedure: RADIOLOGY WITH ANESTHESIA;  Surgeon: Radiologist, Medication, MD;  Location: [facility] OR;  Service: Radiology;  Laterality: N/A;   Patient Active Problem List   Diagnosis Date Noted   Stroke (HCC) 11/04/2023   Cognitive and neurobehavioral dysfunction 10/24/2023   Acute hypoxic respiratory failure (HCC) 10/17/2023   Essential hypertension 10/17/2023   Dysphagia 10/17/2023   Leukocytosis 10/17/2023   Obesity 10/17/2023   Acute ischemic stroke (HCC) 10/07/2023   Middle cerebral artery embolism, right 10/07/2023   ESRD (end stage renal disease) (HCC) 03/26/2022   Hyperlipidemia, unspecified 03/20/2021   Acute gout due to renal impairment involving left wrist 04/24/2019   Malignant hypertension 10/14/2016   Combined congestive systolic and diastolic heart failure (HCC) 06/14/2016   NICM (nonischemic cardiomyopathy) (HCC) 06/14/2016   Renal failure    Hypertensive urgency 06/10/2016   CKD (chronic kidney disease), stage  IV (HCC) 06/10/2016   Normocytic anemia 06/10/2016    ONSET DATE: 12/19/2023 referral date Admit date: 10/18/2023 Discharge date: 11/04/2023  REFERRING DIAG: G81.90 (ICD-10-CM) - Hemiplegia, unspecified affecting unspecified side  THERAPY DIAG:  Hemiplegia and hemiparesis following other cerebrovascular disease affecting left non-dominant side (HCC)  Other symptoms and signs involving the musculoskeletal system  Rationale for Evaluation and  Treatment: Rehabilitation  SUBJECTIVE:   SUBJECTIVE STATEMENT: Pt reports he's getting over a cold and woke up early this morning d/t sickness.  Pt accompanied by: self  PERTINENT HISTORY: Pt is a 48 yo male presenting to this clinic with hemiplegia s/p R MCA embolism. Pt was hospitalized for this from 10/07/23-10/18/23, then discharged to neurology and received IP PT/OT/SLP 10/18/23-11/04/23. Initial CT head revealed right MCA territory ischemic changes with calcific density in proximal right M1 concerning for calcific embolus.   CHF, CM, ESRD on HD TTS, HTN, obesity, HLD  PRECAUTIONS: Fall, L sided weakness, blood thinners  WEIGHT BEARING RESTRICTIONS: No  PAIN:  Are you having pain? No  FALLS: Has patient fallen in last 6 months? No  LIVING ENVIRONMENT: Lives with: lives with their family sister and her 2 sons. Pt reports will be getting own place soon. Lives in: House/apartment townhouse, 2 level Stairs: Yes: Internal: 3 steps; on right going up Has following equipment at home: None  PLOF: Independent and Vocation/Vocational requirements: Worked in distribution in [employer]   PATIENT GOALS: Get back movement in my left arm, return to work, and be able to play video games like I was before.  OBJECTIVE:  Note: Objective measures were completed at Evaluation unless otherwise noted.  HAND DOMINANCE: Right  ADLs: Overall ADLs: Independent Transfers/ambulation related to ADLs: Independent Eating: Independent Grooming: Independent UB Dressing: Independent LB Dressing: Independent Toileting: Independent Bathing: Independent Tub Shower transfers: Independent, shower/tub combo Equipment: none  IADLs: Shopping: Independent Light housekeeping: Mom is doing pharmacologist.  Meal Prep: Needs some help with opening cans and jars d/t weakness in L hand Community mobility: Mother driving pt to appts  Medication management: Mother helping with medication mgmt Financial management:  Independent Handwriting: did not assess d/t pt report of no changes and pt dominant hand unaffected.   MOBILITY STATUS: Independent  POSTURE COMMENTS:  No Significant postural limitations Sitting balance: WFL  ACTIVITY TOLERANCE: Activity tolerance: No changes  FUNCTIONAL OUTCOME MEASURES: Quick Dash: 47.7/100: 47.7% impairment 01/30/24: 52.3% impairment   UPPER EXTREMITY ROM:  R WNL  Active ROM Right eval Left eval  Shoulder flexion  92  Shoulder abduction  70  Wrist flexion  10  Wrist extension  40  Wrist pronation    Wrist supination  Impaired (re-assess)  (Blank rows = not tested)  UPPER EXTREMITY MMT:   WFL RUE, 3-/5 grossly for LUE  HAND FUNCTION: Grip strength: Right: 61.2 lbs; Left: 15.8 lbs 01/20/24: Left: 19.4 lbs  COORDINATION: 9 Hole Peg test: Right: 41.60 sec; Left: unable to pick up a peg in 2 minutes sec Box and Blocks:  Right 36 blocks, Left 11blocks 01/20/24: Left: 15 blocks   SENSATION: WFL  EDEMA: none  MUSCLE TONE: LUE: Rigidity  COGNITION: Overall cognitive status: Within functional limits for tasks assessed  VISION: Subjective report: no concerns Baseline vision: No visual deficits Visual history: no hx  VISION ASSESSMENT: WFL To be further assessed in functional context  Patient has difficulty with following activities due to following visual impairments: none  PERCEPTION: WFL  PRAXIS: Impaired: Ideomotor  OBSERVATIONS: Weakness in L hand  and limited ROM in LUE affecting ability to complete ADL/IADL.                                                                                                                            TREATMENT: 01/30/24 Re-assessed grip strength, Box and Blocks, and ROM as well as QuickDASH for prog note, informed pt of progress.  Engaged in coordination tasks with Uzzle, mod cueing required for proper completion of game. Pt next participated in card game using L hand to flip large cards and find match.  Mod cues were required, noted impaired ability to pinch large cards with L hand.    PATIENT EDUCATION: Education details: SEE ABOVE Person educated: Patient Education method: Explanation, Demonstration, Tactile cues, Verbal cues, and Handouts Education comprehension: verbalized understanding, returned demonstration, verbal cues required, and needs further education  HOME EXERCISE PROGRAM: 12/30/23: Shoulder, Forearm/wrist, and hand HEP  01/02/24: Theraputty HEP with red putty (ACCESS CODE [email or web address]) 01/11/24: Shoulder shrug exercises (same access code as above) 01/16/24: E-stim and saebo glove [employer] 01/20/24: Jar/can opener [employer]   GOALS: Goals reviewed with patient? Yes  SHORT TERM GOALS: Target date: 02/15/24  Pt will be independent with LUE ROM, coordination, and grip strengthening HEP Baseline: New to OP OT Goal status: IN PROGRESS  2.  Pt will increase L shoulder abduction to at least 90 degrees for improved functional use  Baseline: 70 degrees  01/20/24: 90 degrees  Goal status:GOAL MET    3.  Pt will be independent with sleeping strategies for hemiparesis and hemiplegia of LUE Baseline: New to OP OT Goal status: GOAL MET  4.  Pt will be independent with resting hand splint wear and care for night use and to improve functional use in L hand Baseline: New to OP OT Goal status: DISCONTINUE AT THIS TIME  5. Pt will be able to place at least 25 blocks using left hand with completion of Box and Blocks test.   Baseline: 15 blocks 01/30/24: 8 blocks  Goal status: IN PROGRESS   LONG TERM GOALS: Target date: 02/22/24  Pt will decrease QuickDASH score to no more than 25/100 to demonstrate improved function in daily tasks Baseline: 47.5/100 01/30/24: 52.3/100 Goal status: IN PROGRESS  2.  Pt will increase L shoulder flexion ROM to at least 120 degrees for improved functional use Baseline: 92 degrees 01/20/24: 92 degrees  Goal status:IN PROGRESS  3.   Pt will increase L shoulder abduction ROM to at least 100 degrees for improved functional use Baseline: 70 degrees 01/20/24:  90 degrees  01/30/24: 110 degrees, some compensation noted  Goal status: GOAL MET  4.  Pt will increase L wrist flexion and extension by at least 40 degrees and 20 degrees, respectively. Baseline: 10 degrees wrist flexion, 40 degrees wrist extension 01/20/24: 32 degrees wrist flexion, 63 degrees wrist extension 01/31/14: 82 degrees wrist flexion, 64 degrees wrist extension Goal status: GOAL MET  5. Patient will demonstrate at  least 40 lbs L grip strength as needed to open jars and other containers.   Baseline: 19.2  01/30/24: 18.27 lbs  Goal status: IN PROGRESS   ASSESSMENT:  CLINICAL IMPRESSION: This progress note is for dates 12/23/23-01/30/24. Patient presents to tx with LUE weakness and impaired coordination secondary to CVA.  Pt has met 2/5 STGs and 2/5 LTGs. Goal for resting hand splint has been discontinued d/t pt not needing this at this time. Some decline noted in grip strength, Box and Blocks, and QuickDASH, however pt stated he recently developed a cold and reported he didn't sleep well the night before. Pt at this time would benefit from continued skilled services to improve ability to complete ADL/IADL and return to work as able.  PERFORMANCE DEFICITS: in functional skills including ADLs, IADLs, coordination, dexterity, tone, ROM, strength, pain, Fine motor control, body mechanics, cardiopulmonary status limiting function, and UE functional use, cognitive skills including attention, safety awareness, and sequencing, and psychosocial skills including coping strategies, interpersonal interactions, and routines and behaviors.   IMPAIRMENTS: are limiting patient from IADLs, rest and sleep, work, and leisure.   CO-MORBIDITIES: has co-morbidities such as ESRD that affects occupational performance. Patient will benefit from skilled OT to address above impairments  and improve overall function.  REHAB POTENTIAL: Good   PLAN:  OT FREQUENCY: 2x/week  OT DURATION: 8 weeks  PLANNED INTERVENTIONS: 97168 OT Re-evaluation, 97535 self care/ADL training, 02889 therapeutic exercise, 97530 therapeutic activity, 97112 neuromuscular re-education, 97140 manual therapy, 97032 electrical stimulation (manual), 97760 Orthotic Initial, [id number] Orthotic/Prosthetic subsequent, passive range of motion, functional mobility training, visual/perceptual remediation/compensation, energy conservation, patient/family education, and DME and/or AE instructions  RECOMMENDED OTHER SERVICES: none noted  CONSULTED AND AGREED WITH PLAN OF CARE: Patient  PLAN FOR NEXT SESSION:  Continue with E-stim- L shoulder, attempt L hand as needed Coordination/ strengthening activities ROM activities WB activities  [employer], OT 01/30/2024, 12:06 PM

## 2024-01-31 ENCOUNTER — Telehealth: Payer: Self-pay

## 2024-01-31 NOTE — Telephone Encounter (Signed)
 Call to courtney at [facility] urology due to receiving surgical clearance. Advise patient has upcoming NP appt with Dr. Gregg 12/17. She stated form work need to be returned 12/29 for pro op visit and surgery was scheduled for 03/21/24.  Form in Dr Gregg office placed in divider on wall

## 2024-02-03 ENCOUNTER — Ambulatory Visit

## 2024-02-03 ENCOUNTER — Ambulatory Visit: Admitting: Physical Therapy

## 2024-02-03 ENCOUNTER — Encounter: Payer: Self-pay | Admitting: Physical Therapy

## 2024-02-03 VITALS — BP 151/95 | HR 84

## 2024-02-03 DIAGNOSIS — M6281 Muscle weakness (generalized): Secondary | ICD-10-CM

## 2024-02-03 DIAGNOSIS — R278 Other lack of coordination: Secondary | ICD-10-CM

## 2024-02-03 DIAGNOSIS — R2689 Other abnormalities of gait and mobility: Secondary | ICD-10-CM

## 2024-02-03 DIAGNOSIS — I69854 Hemiplegia and hemiparesis following other cerebrovascular disease affecting left non-dominant side: Secondary | ICD-10-CM

## 2024-02-03 NOTE — Therapy (Signed)
 OUTPATIENT OCCUPATIONAL THERAPY NEURO TREATMENT  Patient Name: Nathan Campbell MRN: [id number] DOB:06-24-1975, 48 y.o., male Today's Date: 02/03/2024  PCP: None REFERRING PROVIDER: Sebastian Jerilyn HERO, FNP  END OF SESSION:  OT End of Session - 02/03/24 1157     Visit Number 11    Number of Visits 16    Date for Recertification  02/22/24    Authorization Type [employer] [facility]    Authorization Time Period 12/30/23-02/24/24    Authorization - Visit Number 10    Authorization - Number of Visits 17    Progress Note Due on Visit 10    OT Start Time 0933    OT Stop Time 1015    OT Time Calculation (min) 42 min    Activity Tolerance Patient tolerated treatment well    Behavior During Therapy [facility] for tasks assessed/performed             Past Medical History:  Diagnosis Date   Acute combined systolic and diastolic CHF, NYHA class 4 (HCC) 06/10/2016   Anemia    Cardiomyopathy (HCC) 06/14/2016   CHF (congestive heart failure) (HCC)    COVID 10/2020   Dyspnea    ESRD on hemodialysis (HCC)    TTS at [facility]   Hypertension    Hypertensive heart and kidney disease with heart failure (HCC) 06/14/2016   Hypertensive heart disease with congestive heart failure (HCC)    Obesity    Past Surgical History:  Procedure Laterality Date   A/V FISTULAGRAM Left 05/03/2022   Procedure: A/V Fistulagram;  Surgeon: Eliza Lonni RAMAN, MD;  Location: [facility] INVASIVE CV LAB;  Service: Cardiovascular;  Laterality: Left;   AV FISTULA PLACEMENT Left 11/11/2020   Procedure: LEFT ARM First Stage Basilic Vein Fistula Creation;  Surgeon: Sheree Penne Lonni, MD;  Location: [facility] OR;  Service: Vascular;  Laterality: Left;   BASCILIC VEIN TRANSPOSITION Left 01/23/2021   Procedure: Second Stage Basilic Vein Transposition of Left Arm Arteriovenous  Fistula;  Surgeon: Sheree Penne Lonni, MD;  Location: [facility] OR;  Service: Vascular;  Laterality: Left;  Block  to LMA    COLONOSCOPY     FISTULA  SUPERFICIALIZATION Left 05/07/2022   Procedure: PLICATION OF ANEURYSM, LEFT ARM FISTULA;  Surgeon: Eliza Lonni RAMAN, MD;  Location: [facility] OR;  Service: Vascular;  Laterality: Left;   IR CT HEAD LTD  10/07/2023   IR CT HEAD LTD  10/07/2023   IR PATIENT EVAL TECH 0-60 MINS  10/07/2023   IR PERCUTANEOUS ART THROMBECTOMY/INFUSION INTRACRANIAL INC DIAG ANGIO  10/07/2023   NO PAST SURGERIES     PERIPHERAL VASCULAR BALLOON ANGIOPLASTY Left 05/03/2022   Procedure: PERIPHERAL VASCULAR BALLOON ANGIOPLASTY;  Surgeon: Eliza Lonni RAMAN, MD;  Location: [facility] INVASIVE CV LAB;  Service: Cardiovascular;  Laterality: Left;  AVF   RADIOLOGY WITH ANESTHESIA N/A 10/07/2023   Procedure: RADIOLOGY WITH ANESTHESIA;  Surgeon: Radiologist, Medication, MD;  Location: [facility] OR;  Service: Radiology;  Laterality: N/A;   Patient Active Problem List   Diagnosis Date Noted   Stroke (HCC) 11/04/2023   Cognitive and neurobehavioral dysfunction 10/24/2023   Acute hypoxic respiratory failure (HCC) 10/17/2023   Essential hypertension 10/17/2023   Dysphagia 10/17/2023   Leukocytosis 10/17/2023   Obesity 10/17/2023   Acute ischemic stroke (HCC) 10/07/2023   Middle cerebral artery embolism, right 10/07/2023   ESRD (end stage renal disease) (HCC) 03/26/2022   Hyperlipidemia, unspecified 03/20/2021   Acute gout due to renal impairment involving left wrist 04/24/2019   Malignant hypertension 10/14/2016  Combined congestive systolic and diastolic heart failure (HCC) 06/14/2016   NICM (nonischemic cardiomyopathy) (HCC) 06/14/2016   Renal failure    Hypertensive urgency 06/10/2016   CKD (chronic kidney disease), stage IV (HCC) 06/10/2016   Normocytic anemia 06/10/2016    ONSET DATE: 12/19/2023 referral date Admit date: 10/18/2023 Discharge date: 11/04/2023  REFERRING DIAG: G81.90 (ICD-10-CM) - Hemiplegia, unspecified affecting unspecified side  THERAPY DIAG:  Hemiplegia and hemiparesis following other cerebrovascular disease  affecting left non-dominant side (HCC)  Muscle weakness (generalized)  Other lack of coordination  Rationale for Evaluation and Treatment: Rehabilitation  SUBJECTIVE:   SUBJECTIVE STATEMENT: Pt reports he got the [facility] glove and he has tried it out, stated it went pretty good.   Pt accompanied by: self  PERTINENT HISTORY: Pt is a 48 yo male presenting to this clinic with hemiplegia s/p R MCA embolism. Pt was hospitalized for this from 10/07/23-10/18/23, then discharged to neurology and received IP PT/OT/SLP 10/18/23-11/04/23. Initial CT head revealed right MCA territory ischemic changes with calcific density in proximal right M1 concerning for calcific embolus.   CHF, CM, ESRD on HD TTS, HTN, obesity, HLD  PRECAUTIONS: Fall, L sided weakness, blood thinners  WEIGHT BEARING RESTRICTIONS: No  PAIN:  Are you having pain? No  FALLS: Has patient fallen in last 6 months? No  LIVING ENVIRONMENT: Lives with: lives with their family sister and her 2 sons. Pt reports will be getting own place soon. Lives in: House/apartment townhouse, 2 level Stairs: Yes: Internal: 3 steps; on right going up Has following equipment at home: None  PLOF: Independent and Vocation/Vocational requirements: Worked in distribution in [employer]   PATIENT GOALS: Get back movement in my left arm, return to work, and be able to play video games like I was before.  OBJECTIVE:  Note: Objective measures were completed at Evaluation unless otherwise noted.  HAND DOMINANCE: Right  ADLs: Overall ADLs: Independent Transfers/ambulation related to ADLs: Independent Eating: Independent Grooming: Independent UB Dressing: Independent LB Dressing: Independent Toileting: Independent Bathing: Independent Tub Shower transfers: Independent, shower/tub combo Equipment: none  IADLs: Shopping: Independent Light housekeeping: Mom is doing pharmacologist.  Meal Prep: Needs some help with opening cans and jars d/t weakness in  L hand Community mobility: Mother driving pt to appts  Medication management: Mother helping with medication mgmt Financial management: Independent Handwriting: did not assess d/t pt report of no changes and pt dominant hand unaffected.   MOBILITY STATUS: Independent  POSTURE COMMENTS:  No Significant postural limitations Sitting balance: WFL  ACTIVITY TOLERANCE: Activity tolerance: No changes  FUNCTIONAL OUTCOME MEASURES: Quick Dash: 47.7/100: 47.7% impairment 01/30/24: 52.3% impairment   UPPER EXTREMITY ROM:  R WNL  Active ROM Right eval Left eval  Shoulder flexion  92  Shoulder abduction  70  Wrist flexion  10  Wrist extension  40  Wrist pronation    Wrist supination  Impaired (re-assess)  (Blank rows = not tested)  UPPER EXTREMITY MMT:   WFL RUE, 3-/5 grossly for LUE  HAND FUNCTION: Grip strength: Right: 61.2 lbs; Left: 15.8 lbs 01/20/24: Left: 19.4 lbs  COORDINATION: 9 Hole Peg test: Right: 41.60 sec; Left: unable to pick up a peg in 2 minutes sec Box and Blocks:  Right 36 blocks, Left 11blocks 01/20/24: Left: 15 blocks   SENSATION: WFL  EDEMA: none  MUSCLE TONE: LUE: Rigidity  COGNITION: Overall cognitive status: Within functional limits for tasks assessed  VISION: Subjective report: no concerns Baseline vision: No visual deficits Visual history: no hx  VISION ASSESSMENT: WFL To be further assessed in functional context  Patient has difficulty with following activities due to following visual impairments: none  PERCEPTION: WFL  PRAXIS: Impaired: Ideomotor  OBSERVATIONS: Weakness in L hand and limited ROM in LUE affecting ability to complete ADL/IADL.                                                                                                                            TREATMENT: 02/03/24 Pt engaged in NMES to digit extensors for 10 minutes at 32 intensity with Russian waveform, 248 us , then digit flexors for approximately 3 minutes at 23  intensity, ceased NMES flexors d/t pain.   Pt engaged in WB tasks bearing weight on affected LUE tossing bean bags into stationary target, then hitting moving target with racquet.   Pt completed WB tasks leaning forward on tabletop, and completed 10 wrist flexion/extension with wrist exerciser. D/t inability to grasp Digiflex graded down to pt squeezing OT hand 10 repetitions.    PATIENT EDUCATION: Education details: SEE ABOVE Person educated: Patient Education method: Explanation, Demonstration, Tactile cues, Verbal cues, and Handouts Education comprehension: verbalized understanding, returned demonstration, verbal cues required, and needs further education  HOME EXERCISE PROGRAM: 12/30/23: Shoulder, Forearm/wrist, and hand HEP  01/02/24: Theraputty HEP with red putty (ACCESS CODE [id number]) 01/11/24: Shoulder shrug exercises (same access code as above) 01/16/24: E-stim and saebo glove [employer] 01/20/24: Jar/can opener [employer]   GOALS: Goals reviewed with patient? Yes  SHORT TERM GOALS: Target date: 02/15/24  Pt will be independent with LUE ROM, coordination, and grip strengthening HEP Baseline: New to OP OT Goal status: IN PROGRESS  2.  Pt will increase L shoulder abduction to at least 90 degrees for improved functional use  Baseline: 70 degrees  01/20/24: 90 degrees  Goal status:GOAL MET    3.  Pt will be independent with sleeping strategies for hemiparesis and hemiplegia of LUE Baseline: New to OP OT Goal status: GOAL MET  4.  Pt will be independent with resting hand splint wear and care for night use and to improve functional use in L hand Baseline: New to OP OT Goal status: DISCONTINUE AT THIS TIME  5. Pt will be able to place at least 25 blocks using left hand with completion of Box and Blocks test.   Baseline: 15 blocks 01/30/24: 8 blocks  Goal status: IN PROGRESS   LONG TERM GOALS: Target date: 02/22/24  Pt will decrease QuickDASH score to no  more than 25/100 to demonstrate improved function in daily tasks Baseline: 47.5/100 01/30/24: 52.3/100 Goal status: IN PROGRESS  2.  Pt will increase L shoulder flexion ROM to at least 120 degrees for improved functional use Baseline: 92 degrees 01/20/24: 92 degrees  Goal status:IN PROGRESS  3.  Pt will increase L shoulder abduction ROM to at least 100 degrees for improved functional use Baseline: 70 degrees 01/20/24:  90 degrees  01/30/24: 110 degrees, some compensation noted  Goal status: GOAL MET  4.  Pt will increase L wrist flexion and extension by at least 40 degrees and 20 degrees, respectively. Baseline: 10 degrees wrist flexion, 40 degrees wrist extension 01/20/24: 32 degrees wrist flexion, 63 degrees wrist extension 01/31/14: 82 degrees wrist flexion, 64 degrees wrist extension Goal status: GOAL MET  5. Patient will demonstrate at least 40 lbs L grip strength as needed to open jars and other containers.   Baseline: 19.2  01/30/24: 18.27 lbs  Goal status: IN PROGRESS   ASSESSMENT:  CLINICAL IMPRESSION: This progress note is for dates 12/23/23-01/30/24. Patient presents to tx with LUE weakness and impaired coordination secondary to CVA.  Attempted digit flexion NMES this date but ceased d/t pain. Pt participated well with WB tasks. Pt at this time would benefit from continued skilled services to improve ability to complete ADL/IADL and return to work as able.  PERFORMANCE DEFICITS: in functional skills including ADLs, IADLs, coordination, dexterity, tone, ROM, strength, pain, Fine motor control, body mechanics, cardiopulmonary status limiting function, and UE functional use, cognitive skills including attention, safety awareness, and sequencing, and psychosocial skills including coping strategies, interpersonal interactions, and routines and behaviors.   IMPAIRMENTS: are limiting patient from IADLs, rest and sleep, work, and leisure.   CO-MORBIDITIES: has co-morbidities such  as ESRD that affects occupational performance. Patient will benefit from skilled OT to address above impairments and improve overall function.  REHAB POTENTIAL: Good   PLAN:  OT FREQUENCY: 2x/week  OT DURATION: 8 weeks  PLANNED INTERVENTIONS: 97168 OT Re-evaluation, 97535 self care/ADL training, 02889 therapeutic exercise, 97530 therapeutic activity, 97112 neuromuscular re-education, 97140 manual therapy, 97032 electrical stimulation (manual), 97760 Orthotic Initial, [id number] Orthotic/Prosthetic subsequent, passive range of motion, functional mobility training, visual/perceptual remediation/compensation, energy conservation, patient/family education, and DME and/or AE instructions  RECOMMENDED OTHER SERVICES: none noted  CONSULTED AND AGREED WITH PLAN OF CARE: Patient  PLAN FOR NEXT SESSION:  Continue with E-stim- L shoulder Coordination/ strengthening activities ROM activities WB activities  [employer], OT 02/03/2024, 12:04 PM

## 2024-02-03 NOTE — Therapy (Signed)
 OUTPATIENT PHYSICAL THERAPY NEURO TREATMENT   Patient Name: Narciso Stoutenburg MRN: [id number] DOB:21-Aug-1975, 48 y.o., male Today's Date: 02/03/2024   PCP: Pt reports PCP is [facility] but did not give any further identifying information. REFERRING PROVIDER: Sebastian Jerilyn HERO, FNP  END OF SESSION:  PT End of Session - 02/03/24 0850     Visit Number 13    Number of Visits 17   16 plus Eval   Date for Recertification  02/24/24   For scheduling delays   Authorization Type [facility] Medicare    Authorization Time Period 12/26/23 - 02/20/24    Authorization - Visit Number 13    Authorization - Number of Visits 17    Progress Note Due on Visit 10    PT Start Time 0848    PT Stop Time 0930    PT Time Calculation (min) 42 min    Equipment Utilized During Treatment Gait belt    Activity Tolerance Patient tolerated treatment well    Behavior During Therapy WFL for tasks assessed/performed           Past Medical History:  Diagnosis Date   Acute combined systolic and diastolic CHF, NYHA class 4 (HCC) 06/10/2016   Anemia    Cardiomyopathy (HCC) 06/14/2016   CHF (congestive heart failure) (HCC)    COVID 10/2020   Dyspnea    ESRD on hemodialysis (HCC)    TTS at [facility]   Hypertension    Hypertensive heart and kidney disease with heart failure (HCC) 06/14/2016   Hypertensive heart disease with congestive heart failure (HCC)    Obesity    Past Surgical History:  Procedure Laterality Date   A/V FISTULAGRAM Left 05/03/2022   Procedure: A/V Fistulagram;  Surgeon: Eliza Lonni RAMAN, MD;  Location: [facility] INVASIVE CV LAB;  Service: Cardiovascular;  Laterality: Left;   AV FISTULA PLACEMENT Left 11/11/2020   Procedure: LEFT ARM First Stage Basilic Vein Fistula Creation;  Surgeon: Sheree Penne Lonni, MD;  Location: [facility] OR;  Service: Vascular;  Laterality: Left;   BASCILIC VEIN TRANSPOSITION Left 01/23/2021   Procedure: Second Stage Basilic Vein Transposition of Left Arm Arteriovenous   Fistula;  Surgeon: Sheree Penne Lonni, MD;  Location: [facility] OR;  Service: Vascular;  Laterality: Left;  Block  to LMA    COLONOSCOPY     FISTULA SUPERFICIALIZATION Left 05/07/2022   Procedure: PLICATION OF ANEURYSM, LEFT ARM FISTULA;  Surgeon: Eliza Lonni RAMAN, MD;  Location: [facility] OR;  Service: Vascular;  Laterality: Left;   IR CT HEAD LTD  10/07/2023   IR CT HEAD LTD  10/07/2023   IR PATIENT EVAL TECH 0-60 MINS  10/07/2023   IR PERCUTANEOUS ART THROMBECTOMY/INFUSION INTRACRANIAL INC DIAG ANGIO  10/07/2023   NO PAST SURGERIES     PERIPHERAL VASCULAR BALLOON ANGIOPLASTY Left 05/03/2022   Procedure: PERIPHERAL VASCULAR BALLOON ANGIOPLASTY;  Surgeon: Eliza Lonni RAMAN, MD;  Location: [facility] INVASIVE CV LAB;  Service: Cardiovascular;  Laterality: Left;  AVF   RADIOLOGY WITH ANESTHESIA N/A 10/07/2023   Procedure: RADIOLOGY WITH ANESTHESIA;  Surgeon: Radiologist, Medication, MD;  Location: [facility] OR;  Service: Radiology;  Laterality: N/A;   Patient Active Problem List   Diagnosis Date Noted   Stroke (HCC) 11/04/2023   Cognitive and neurobehavioral dysfunction 10/24/2023   Acute hypoxic respiratory failure (HCC) 10/17/2023   Essential hypertension 10/17/2023   Dysphagia 10/17/2023   Leukocytosis 10/17/2023   Obesity 10/17/2023   Acute ischemic stroke (HCC) 10/07/2023   Middle cerebral artery embolism, right 10/07/2023  ESRD (end stage renal disease) (HCC) 03/26/2022   Hyperlipidemia, unspecified 03/20/2021   Acute gout due to renal impairment involving left wrist 04/24/2019   Malignant hypertension 10/14/2016   Combined congestive systolic and diastolic heart failure (HCC) 06/14/2016   NICM (nonischemic cardiomyopathy) (HCC) 06/14/2016   Renal failure    Hypertensive urgency 06/10/2016   CKD (chronic kidney disease), stage IV (HCC) 06/10/2016   Normocytic anemia 06/10/2016    ONSET DATE: 12/14/2023- date of referral  REFERRING DIAG: G81.90 (ICD-10-CM) - Hemiparesis (HCC)  THERAPY  DIAG:  Hemiplegia and hemiparesis following other cerebrovascular disease affecting left non-dominant side (HCC)  Muscle weakness (generalized)  Other lack of coordination  Other abnormalities of gait and mobility  Rationale for Evaluation and Treatment: Rehabilitation  SUBJECTIVE:  Likes to be called LC.                                                                                                                                                                                            SUBJECTIVE STATEMENT: Received SLP referral; plan to schedule SLP eval after therapy sessions today.  Has to change therapy appointment on Monday d/t change in dialysis d/t Holiday scheduling (pt plans to do at front desk after OT session).  No recent falls.  Has OT after PT session.  No acute changes reported. Pt accompanied by: self.  Mom drove pt.  PERTINENT HISTORY: Hospitalization 10/07/23 for acute onset of left-sided weakness, left side neglect, dysarthria and right gaze preference; imaging showing acute right MCA CVA with right M1 occlusion (intubated for airway protection; s/p TNK; s/p revascularization of R MCA with IR 7/25).  IP rehab stay 10/18/23-11/04/23. Urgent care visit 12/02/23 for gout (L foot).  PMH includes: gout, CKD, ESRD on HD T/R/Sat, CHF, anemia, htn, stroke 10/07/23, acute hypoxic respiratory failure, dysphagia, cardiomyopathy, and obesity (BMI 41).  PAIN:  Are you having pain? Yes: NPRS scale: 0/10 currently Pain location: L shoulder Pain description: Aching Aggravating factors: Laying down Relieving factors: Muscle relaxer  PRECAUTIONS: Fall; fistula in LUE (dialysis T/Th/Sat)  RED FLAGS: None   WEIGHT BEARING RESTRICTIONS: No  FALLS: Has patient fallen in last 6 months? No  LIVING ENVIRONMENT: Lives with: Sister and her 2 sons; mom currently staying with pt to assist as needed Lives in: Other Townhouse--stays on 2nd floor Stairs: Yes: Internal: 5 steps; on right going  up and External: 0 steps; none Has following equipment at home: Crutches  PLOF: Independent  PATIENT GOALS: To get more function in arm; get voice back; get strength up.  OBJECTIVE:  Note: Objective measures were completed at Evaluation unless otherwise noted.  COGNITION: Overall cognitive status: A&Ox4; possible decreased awareness of deficits  COORDINATION:  Rapid Alternating toe taps (in sitting): Decreased L LE  Heel to shin (in sitting); mild decreased L LE  LOWER EXTREMITY ROM:     Active  Right Eval Left Eval  Hip flexion WNL WNL  Hip extension    Hip abduction    Hip adduction    Hip internal rotation    Hip external rotation    Knee flexion WNL WNL  Knee extension WNL WNL  Ankle dorsiflexion WNL WNL  Ankle plantarflexion WNL WNL  Ankle inversion    Ankle eversion     (Blank rows = not tested)  LOWER EXTREMITY MMT:    MMT Right Eval Left Eval  Hip flexion 5/5 4+/5  Hip extension    Hip abduction    Hip adduction    Hip internal rotation    Hip external rotation    Knee flexion 5/5 4+/5  Knee extension 5/5 5/5  Ankle dorsiflexion 5/5 4+/5  Ankle plantarflexion At least 3/5 AROM At least 3/5 AROM  Ankle inversion    Ankle eversion    (Blank rows = not tested) GAIT: Findings: Gait Characteristics: step through pattern, decreased arm swing- Left, decreased ankle dorsiflexion- Left, and wide BOS, Distance walked: clinic distances, Assistive device utilized:None, Level of assistance: SBA, and Comments: pt tending to walk close to items on L side but did not bump into anything  FUNCTIONAL TESTS:  5 times sit to stand: 20.81 seconds 10 meter walk test: 1.25 m/sec (7.97 seconds); no AD use Functional gait assessment: 18/30 (12/26/23) SLS: R LE 1st 2 trials about 1 second each but on 3rd trial 9.0 seconds; L LE 1st 2 trials about 1 second each but on 3rd trial 30 seconds L LE (time stopped by therapist at 30 seconds) TUG (Manual): 9.41 seconds 12/26/23 TUG  (Cognitive): 26.25 seconds 12/26/23  FUNCTIONAL GAIT ASSESSMENT  Date: 12/26/23 Score  GAIT LEVEL SURFACE Instructions: Walk at your normal speed from here to the next mark (6 m) [20 ft]. (1) Moderate impairment-Walks 6 m (20 ft), slow speed, abnormal gait pattern, evidence for imbalance, or deviates 25.4 - 38.1 cm (10 -15 in) outside of the 30.48-cm (12-in) walkway width. Requires more than 7 seconds to ambulate 6 m (20 ft). 8.53 seconds  2.   CHANGE IN GAIT SPEED Instructions: Begin walking at your normal pace (for 1.5 m [5 ft]). When I tell you "go," walk as fast as you can (for 1.5 m [5 ft]). When I tell you "slow," walk as slowly as you can (for 1.5 m [5 ft]. (2) Mild impairment - Is able to change speed but demonstrates mild gait deviations, deviates 15.24 -25.4 cm (6 -10 in) outside of the 30.48-cm (12-in) walkway width, or no gait deviations but unable to achieve a significant change in velocity, or uses an assistive device  3.    GAIT WITH HORIZONTAL HEAD TURNS Instructions: Walk from here to the next mark 6 m (20 ft) away. Begin walking at your normal pace. Keep walking straight; after 3 steps, turn your head to the right and keep walking straight while looking to the right. After 3 more steps, turn your head to the left and keep walking straight while looking left. Continue alternating looking right and left. (2) Mild impairment - Performs head turns smoothly with slight change in gait velocity (eg, minor disruption to smooth gait path), deviates 15.24 -25.4 cm (6 -10 in) outside 30.48-cm (12-in)  walkway width, or uses an assistive device.  4.   GAIT WITH VERTICAL HEAD TURNS Instructions: Walk from here to the next mark (6 m [20 ft]). Begin walking at your normal pace. Keep walking straight; after 3 steps, tip your head up and keep walking straight while looking up. After 3 more steps, tip your head down, keep walking straight while looking down. Continue  alternating looking up and down every  3 steps until you have completed 2 repetitions in each direction. (2) Mild impairment - Performs task with slight change in gait velocity (eg, minor disruption to smooth gait path), deviates 15.24 -25.4 cm (6 -10 in) outside 30.48-cm (12-in) walkway width or uses assistive device.  5.  GAIT AND PIVOT TURN Instructions: Begin with walking at your normal pace. When I tell you, "turn and stop," turn as quickly as you can to face the opposite direction and stop. (2) Mild impairment - Pivot turns safely in 3 seconds and stops with no loss of balance, or pivot turns safely within 3 seconds and stops with mild imbalance, requires small steps to catch balance  6.   STEP OVER OBSTACLE Instructions: Begin walking at your normal speed. When you come to the shoe box, step over it, not around it, and keep walking. (2) Mild impairment - Is able to step over one shoe box (11.43 cm [4.5 in] total height) without changing gait speed; no evidence of imbalance.  7.   GAIT WITH NARROW BASE OF SUPPORT Instructions: Walk on the floor with arms folded across the chest, feet aligned heel to toe in tandem for a distance of 3.6 m [12 ft]. The number of steps taken in a straight line are counted for a maximum of 10 steps. (2) Mild impairment - Ambulates 7-9 steps  8.   GAIT WITH EYES CLOSED Instructions: Walk at your normal speed from here to the next mark (6 m [20 ft]) with your eyes closed. (2) Mild impairment - Walks 6 m (20 ft), uses assistive device, slower speed, mild gait deviations, deviates 15.24 -25.4 cm (6 -10 in) outside 30.48-cm (12-in) walkway width. Ambulates 6 m (20 ft) in less than 9 seconds but greater than 7 seconds 7.72 seconds  9.   AMBULATING BACKWARDS Instructions: Walk backwards until I tell you to stop (2) Mild impairment - Walks 6 m (20 ft), uses assistive device, slower speed, mild gait deviations, deviates 15.24 -25.4 cm (6 -10 in) outside 30.48-cm (12-in) walkway width12.06 seconds  10.  STEPS Instructions: Walk up these stairs as you would at home (ie, using the rail if necessary). At the top turn around and walk down. (1) Moderate impairment-Two feet to a stair; must use rail.  Total 18/30   Interpretation of scores: Non-Specific Older Adults Cutoff Score: <=22/30 = risk of falls Parkinson's Disease Cutoff score <15/30= fall risk (Hoehn & Yahr 1-4)  Minimally Clinically Important Difference (MCID)  Stroke (acute, subacute, and chronic) = MDC: 4.2 points Vestibular (acute) = MDC: 6 points Community Dwelling Older Adults =  MCID: 4 points Parkinson's Disease  =  MDC: 4.3 points  (Academy of Neurologic Physical Therapy (nd). Functional Gait Assessment. Retrieved from https://www.neuropt.org/docs/default-source/cpgs/core-outcome-measures/function-gait-assessment-pocket-guide-proof9-(2).pdf?sfvrsn=b31f35043_0.)   PATIENT SURVEYS:  Stroke Impact Scale : TBA  TREATMENT DATE: 02/03/24  Self Care: BP and HR taken in sitting at rest beginning of session (see below for details).  Pt reports taking BP medication this morning; no adverse symptoms reported (pt reports feeling good). Vitals:   02/03/24 0854  BP: (!) 151/95  Pulse: 84  HR 86 bpm and SpO2 sats 95% on room air end of session  Therapeutic Activity: 6 feet balance beam in // bars (to improve balance): x5 reps (L then R = 1 rep) sidestepping (CGA); no UE support x5 reps (L then R = 1 rep) sidestepping holding 4# medicine ball with B hands at chest level (CGA to occasional min assist for balance) x10 reps (1 rep = 1 length of balance beam) heel to toe forward walking (no UE support; pt intermittently touching // bars for support/balance); CGA Ambulation: x230 feet (sitting rest break after) and then x240 feet; 4# B ankle weights; no UE support; SBA. Notes: HR 91 bpm post activity 1st trial and 82  bpm 2nd trial Sit to stands from mat table holding medicine ball with B hands at chest level: x10 reps x1 set with 6# medicine ball x10 reps x1 set with 4# medicine ball Notes: SBA; pt fatigued with activity 1st trial so switched from 6# to 4# medicine ball on 2nd set (improved tolerance noted)  PATIENT EDUCATION: Education details: Schedule SLP eval.  Continue HEP. Person educated: Patient Education method: Explanation, Demonstration, and Verbal cues Education comprehension: verbalized understanding  HOME EXERCISE PROGRAM: 01/13/24 Access Code: [email or web address] URL: [email or web address] Date: 01/13/2024 Prepared by: Damien Caulk  Exercises - Standing March with Counter Support  - 1 x daily - 5 x weekly - 2 sets - 10 reps - Standing Hip Abduction with Counter Support  - 1 x daily - 5 x weekly - 2 sets - 10 reps - Standing Single Leg Stance with Counter Support  - 1 x daily - 5 x weekly - 2 sets - 1 reps - 30 second hold - Supine Bridge  - 1 x daily - 5 x weekly - 2 sets - 10 reps - Clamshell  - 1 x daily - 5 x weekly - 2 sets - 10 reps  GOALS: Goals reviewed with patient? Yes  SHORT TERM GOALS: Target date: 01/20/2024 (assessed 01/20/24)  Patient will demo at least 50% compliance with HEP to self manage symptoms.  Baseline: Goal status: MET (pt reports 50% compliance)  2.  Assess FGA Baseline: 18/30 (12/26/23) Goal status: MET  3.  Assess TUG (Cognitive) Baseline: 26.25 seconds Goal status: MET   LONG TERM GOALS: Target date: 02/17/2024   Pt will decrease 5 Time Sit to Stand by at least 3 seconds in order to demonstrate clinically significant improvement in LE strength.  Baseline: 20.81 seconds (Eval) Goal status: INITIAL  2.  Pt will decrease TUG (Cognitive) by at least (or equal to) 3 seconds in order to demonstrate decreased fall risk. Baseline: 26.25 seconds  Goal status: INITIAL  3.  Patient will demo at least 5 points of improvement on FGA to improve  functional balance and reduce fall risk. Baseline: 18/30 (12/26/23) Goal status: INITIAL  4.  Pt will be independent with final HEP in order to improve strength and balance, to decrease fall risk, and improve function at home for ADL's and at work. Baseline:  Goal status: INITIAL   ASSESSMENT:  CLINICAL IMPRESSION: Patient was seen today for physical therapy treatment to address balance, transfers, and gait.  Focused session on improving foot clearance  with ambulation (with use of ankle weights), dynamic balance on compliant surface, and improving transfers (pt holding weighted medicine ball).  Patient continues to be limited by strength and balance.  They demonstrate improvement in foot clearance after using ankle weights.  They would continue to benefit from skilled PT to address impairments as noted and progress towards long term goals.  OBJECTIVE IMPAIRMENTS: Abnormal gait, cardiopulmonary status limiting activity, decreased activity tolerance, decreased balance, decreased cognition, decreased coordination, decreased endurance, decreased knowledge of condition, decreased knowledge of use of DME, decreased mobility, difficulty walking, decreased strength, decreased safety awareness, increased fascial restrictions, impaired flexibility, impaired sensation, impaired tone, impaired UE functional use, impaired vision/preception, improper body mechanics, and pain.   ACTIVITY LIMITATIONS: carrying, lifting, bending, standing, squatting, sleeping, stairs, transfers, locomotion level, and caring for others  PARTICIPATION LIMITATIONS: meal prep, cleaning, laundry, medication management, driving, shopping, community activity, occupation, and yard work  PERSONAL FACTORS: Age, Time since onset of injury/illness/exacerbation, and 1-2 comorbidities: CHF, hx of CVA, ESRD are also affecting patient's functional outcome.   REHAB POTENTIAL: Good  CLINICAL DECISION MAKING: Evolving/moderate  complexity  EVALUATION COMPLEXITY: Moderate  PLAN:  PT FREQUENCY: 2x/week  PT DURATION: 8 weeks  PLANNED INTERVENTIONS: [id number]- PT Re-evaluation, 97750- Physical Performance Testing, 97110-Therapeutic exercises, 97530- Therapeutic activity, 97112- Neuromuscular re-education, 97535- Self Care, [id number]- Manual therapy, [phone]- Gait training, [phone]- Orthotic/Prosthetic subsequent, [phone]- Aquatic Therapy, [id number]- Electrical stimulation (unattended), Patient/Family education, Balance training, Stair training, Taping, Joint mobilization, Spinal mobilization, Cognitive remediation, DME instructions, Wheelchair mobility training, Cryotherapy, and Moist heat  PLAN FOR NEXT SESSION: Did pt schedule SLP eval?; higher level balance with cognitive tasks/dual tasks; [employer]; challenge awareness of L side, L hip abd strengthening, quadruped, half kneel, turning   [employer], PT 02/03/2024, 10:44 AM

## 2024-02-06 ENCOUNTER — Encounter

## 2024-02-06 ENCOUNTER — Ambulatory Visit: Admitting: Physical Therapy

## 2024-02-08 ENCOUNTER — Ambulatory Visit: Admitting: Physical Therapy

## 2024-02-08 ENCOUNTER — Encounter: Admitting: Occupational Therapy

## 2024-02-13 ENCOUNTER — Ambulatory Visit: Admitting: Physical Therapy

## 2024-02-13 ENCOUNTER — Encounter: Payer: Self-pay | Admitting: Physical Therapy

## 2024-02-13 ENCOUNTER — Encounter

## 2024-02-13 VITALS — BP 139/96 | HR 84

## 2024-02-13 DIAGNOSIS — R29898 Other symptoms and signs involving the musculoskeletal system: Secondary | ICD-10-CM | POA: Diagnosis present

## 2024-02-13 DIAGNOSIS — R29818 Other symptoms and signs involving the nervous system: Secondary | ICD-10-CM | POA: Insufficient documentation

## 2024-02-13 DIAGNOSIS — M6281 Muscle weakness (generalized): Secondary | ICD-10-CM | POA: Diagnosis present

## 2024-02-13 DIAGNOSIS — R208 Other disturbances of skin sensation: Secondary | ICD-10-CM | POA: Diagnosis present

## 2024-02-13 DIAGNOSIS — I69854 Hemiplegia and hemiparesis following other cerebrovascular disease affecting left non-dominant side: Secondary | ICD-10-CM | POA: Diagnosis present

## 2024-02-13 DIAGNOSIS — R278 Other lack of coordination: Secondary | ICD-10-CM | POA: Diagnosis present

## 2024-02-13 DIAGNOSIS — R2689 Other abnormalities of gait and mobility: Secondary | ICD-10-CM | POA: Insufficient documentation

## 2024-02-13 NOTE — Therapy (Signed)
 OUTPATIENT PHYSICAL THERAPY NEURO TREATMENT   Patient Name: Nathan Campbell MRN: [id number] DOB:10-30-1975, 48 y.o., male Today's Date: 02/13/2024   PCP: Pt reports PCP is [facility] but did not give any further identifying information. REFERRING PROVIDER: Sebastian Jerilyn HERO, FNP  END OF SESSION:  PT End of Session - 02/13/24 0851     Visit Number 14    Number of Visits 17   16 plus Eval   Date for Recertification  02/24/24   For scheduling delays   Authorization Type [facility] Medicare    Authorization Time Period 12/26/23 - 02/20/24    Authorization - Visit Number 14    Authorization - Number of Visits 17    Progress Note Due on Visit 10    PT Start Time [id number]   Therapist running late   PT Stop Time 0940    PT Time Calculation (min) 51 min    Equipment Utilized During Treatment Gait belt    Activity Tolerance Patient tolerated treatment well    Behavior During Therapy WFL for tasks assessed/performed           Past Medical History:  Diagnosis Date   Acute combined systolic and diastolic CHF, NYHA class 4 (HCC) 06/10/2016   Anemia    Cardiomyopathy (HCC) 06/14/2016   CHF (congestive heart failure) (HCC)    COVID 10/2020   Dyspnea    ESRD on hemodialysis (HCC)    TTS at [facility]   Hypertension    Hypertensive heart and kidney disease with heart failure (HCC) 06/14/2016   Hypertensive heart disease with congestive heart failure (HCC)    Obesity    Past Surgical History:  Procedure Laterality Date   A/V FISTULAGRAM Left 05/03/2022   Procedure: A/V Fistulagram;  Surgeon: Eliza Lonni RAMAN, MD;  Location: [facility] INVASIVE CV LAB;  Service: Cardiovascular;  Laterality: Left;   AV FISTULA PLACEMENT Left 11/11/2020   Procedure: LEFT ARM First Stage Basilic Vein Fistula Creation;  Surgeon: Sheree Penne Lonni, MD;  Location: [facility] OR;  Service: Vascular;  Laterality: Left;   BASCILIC VEIN TRANSPOSITION Left 01/23/2021   Procedure: Second Stage Basilic Vein Transposition of  Left Arm Arteriovenous  Fistula;  Surgeon: Sheree Penne Lonni, MD;  Location: [facility] OR;  Service: Vascular;  Laterality: Left;  Block  to LMA    COLONOSCOPY     FISTULA SUPERFICIALIZATION Left 05/07/2022   Procedure: PLICATION OF ANEURYSM, LEFT ARM FISTULA;  Surgeon: Eliza Lonni RAMAN, MD;  Location: [facility] OR;  Service: Vascular;  Laterality: Left;   IR CT HEAD LTD  10/07/2023   IR CT HEAD LTD  10/07/2023   IR PATIENT EVAL TECH 0-60 MINS  10/07/2023   IR PERCUTANEOUS ART THROMBECTOMY/INFUSION INTRACRANIAL INC DIAG ANGIO  10/07/2023   NO PAST SURGERIES     PERIPHERAL VASCULAR BALLOON ANGIOPLASTY Left 05/03/2022   Procedure: PERIPHERAL VASCULAR BALLOON ANGIOPLASTY;  Surgeon: Eliza Lonni RAMAN, MD;  Location: [facility] INVASIVE CV LAB;  Service: Cardiovascular;  Laterality: Left;  AVF   RADIOLOGY WITH ANESTHESIA N/A 10/07/2023   Procedure: RADIOLOGY WITH ANESTHESIA;  Surgeon: Radiologist, Medication, MD;  Location: [facility] OR;  Service: Radiology;  Laterality: N/A;   Patient Active Problem List   Diagnosis Date Noted   Stroke (HCC) 11/04/2023   Cognitive and neurobehavioral dysfunction 10/24/2023   Acute hypoxic respiratory failure (HCC) 10/17/2023   Essential hypertension 10/17/2023   Dysphagia 10/17/2023   Leukocytosis 10/17/2023   Obesity 10/17/2023   Acute ischemic stroke (HCC) 10/07/2023   Middle cerebral artery embolism,  right 10/07/2023   ESRD (end stage renal disease) (HCC) 03/26/2022   Hyperlipidemia, unspecified 03/20/2021   Acute gout due to renal impairment involving left wrist 04/24/2019   Malignant hypertension 10/14/2016   Combined congestive systolic and diastolic heart failure (HCC) 06/14/2016   NICM (nonischemic cardiomyopathy) (HCC) 06/14/2016   Renal failure    Hypertensive urgency 06/10/2016   CKD (chronic kidney disease), stage IV (HCC) 06/10/2016   Normocytic anemia 06/10/2016    ONSET DATE: 12/14/2023- date of referral  REFERRING DIAG: G81.90 (ICD-10-CM) -  Hemiparesis (HCC)  THERAPY DIAG:  Hemiplegia and hemiparesis following other cerebrovascular disease affecting left non-dominant side (HCC)  Muscle weakness (generalized)  Other lack of coordination  Other abnormalities of gait and mobility  Rationale for Evaluation and Treatment: Rehabilitation  SUBJECTIVE:  Likes to be called LC.                                                                                                                                                                                            SUBJECTIVE STATEMENT: Still needs to schedule SLP eval.  No recent falls.  No acute changes.  Had a nice Thanksgiving with family. Pt accompanied by: self.  Took Lyft to appt (taking bus home).  PERTINENT HISTORY: Hospitalization 10/07/23 for acute onset of left-sided weakness, left side neglect, dysarthria and right gaze preference; imaging showing acute right MCA CVA with right M1 occlusion (intubated for airway protection; s/p TNK; s/p revascularization of R MCA with IR 7/25).  IP rehab stay 10/18/23-11/04/23. Urgent care visit 12/02/23 for gout (L foot).  PMH includes: gout, CKD, ESRD on HD T/R/Sat, CHF, anemia, htn, stroke 10/07/23, acute hypoxic respiratory failure, dysphagia, cardiomyopathy, and obesity (BMI 41).  PAIN:  Are you having pain? Yes: NPRS scale: 0/10 currently Pain location: L shoulder Pain description: Aching Aggravating factors: Laying down Relieving factors: Muscle relaxer  PRECAUTIONS: Fall; fistula in LUE (dialysis T/Th/Sat)  RED FLAGS: None   WEIGHT BEARING RESTRICTIONS: No  FALLS: Has patient fallen in last 6 months? No  LIVING ENVIRONMENT: Lives with: Sister and her 2 sons; mom currently staying with pt to assist as needed Lives in: Other Townhouse--stays on 2nd floor Stairs: Yes: Internal: 5 steps; on right going up and External: 0 steps; none Has following equipment at home: Crutches  PLOF: Independent  PATIENT GOALS: To get more  function in arm; get voice back; get strength up.  OBJECTIVE:  Note: Objective measures were completed at Evaluation unless otherwise noted.  COGNITION: Overall cognitive status: A&Ox4; possible decreased awareness of deficits  COORDINATION:  Rapid Alternating toe taps (in sitting): Decreased L LE  Heel to shin (in sitting); mild decreased L LE  LOWER EXTREMITY ROM:     Active  Right Eval Left Eval  Hip flexion WNL WNL  Hip extension    Hip abduction    Hip adduction    Hip internal rotation    Hip external rotation    Knee flexion WNL WNL  Knee extension WNL WNL  Ankle dorsiflexion WNL WNL  Ankle plantarflexion WNL WNL  Ankle inversion    Ankle eversion     (Blank rows = not tested)  LOWER EXTREMITY MMT:    MMT Right Eval Left Eval  Hip flexion 5/5 4+/5  Hip extension    Hip abduction    Hip adduction    Hip internal rotation    Hip external rotation    Knee flexion 5/5 4+/5  Knee extension 5/5 5/5  Ankle dorsiflexion 5/5 4+/5  Ankle plantarflexion At least 3/5 AROM At least 3/5 AROM  Ankle inversion    Ankle eversion    (Blank rows = not tested) GAIT: Findings: Gait Characteristics: step through pattern, decreased arm swing- Left, decreased ankle dorsiflexion- Left, and wide BOS, Distance walked: clinic distances, Assistive device utilized:None, Level of assistance: SBA, and Comments: pt tending to walk close to items on L side but did not bump into anything  FUNCTIONAL TESTS:  5 times sit to stand: 20.81 seconds 10 meter walk test: 1.25 m/sec (7.97 seconds); no AD use Functional gait assessment: 18/30 (12/26/23) SLS: R LE 1st 2 trials about 1 second each but on 3rd trial 9.0 seconds; L LE 1st 2 trials about 1 second each but on 3rd trial 30 seconds L LE (time stopped by therapist at 30 seconds) TUG (Manual): 9.41 seconds 12/26/23 TUG (Cognitive): 26.25 seconds 12/26/23  FUNCTIONAL GAIT ASSESSMENT  Date: 12/26/23 Score  GAIT LEVEL SURFACE Instructions:  Walk at your normal speed from here to the next mark (6 m) [20 ft]. (1) Moderate impairment-Walks 6 m (20 ft), slow speed, abnormal gait pattern, evidence for imbalance, or deviates 25.4 - 38.1 cm (10 -15 in) outside of the 30.48-cm (12-in) walkway width. Requires more than 7 seconds to ambulate 6 m (20 ft). 8.53 seconds  2.   CHANGE IN GAIT SPEED Instructions: Begin walking at your normal pace (for 1.5 m [5 ft]). When I tell you "go," walk as fast as you can (for 1.5 m [5 ft]). When I tell you "slow," walk as slowly as you can (for 1.5 m [5 ft]. (2) Mild impairment - Is able to change speed but demonstrates mild gait deviations, deviates 15.24 -25.4 cm (6 -10 in) outside of the 30.48-cm (12-in) walkway width, or no gait deviations but unable to achieve a significant change in velocity, or uses an assistive device  3.    GAIT WITH HORIZONTAL HEAD TURNS Instructions: Walk from here to the next mark 6 m (20 ft) away. Begin walking at your normal pace. Keep walking straight; after 3 steps, turn your head to the right and keep walking straight while looking to the right. After 3 more steps, turn your head to the left and keep walking straight while looking left. Continue alternating looking right and left. (2) Mild impairment - Performs head turns smoothly with slight change in gait velocity (eg, minor disruption to smooth gait path), deviates 15.24 -25.4 cm (6 -10 in) outside 30.48-cm (12-in) walkway width, or uses an assistive device.  4.   GAIT WITH VERTICAL HEAD TURNS Instructions: Walk from here to the next  mark (6 m [20 ft]). Begin walking at your normal pace. Keep walking straight; after 3 steps, tip your head up and keep walking straight while looking up. After 3 more steps, tip your head down, keep walking straight while looking down. Continue  alternating looking up and down every 3 steps until you have completed 2 repetitions in each direction. (2) Mild impairment - Performs task with slight change in  gait velocity (eg, minor disruption to smooth gait path), deviates 15.24 -25.4 cm (6 -10 in) outside 30.48-cm (12-in) walkway width or uses assistive device.  5.  GAIT AND PIVOT TURN Instructions: Begin with walking at your normal pace. When I tell you, "turn and stop," turn as quickly as you can to face the opposite direction and stop. (2) Mild impairment - Pivot turns safely in 3 seconds and stops with no loss of balance, or pivot turns safely within 3 seconds and stops with mild imbalance, requires small steps to catch balance  6.   STEP OVER OBSTACLE Instructions: Begin walking at your normal speed. When you come to the shoe box, step over it, not around it, and keep walking. (2) Mild impairment - Is able to step over one shoe box (11.43 cm [4.5 in] total height) without changing gait speed; no evidence of imbalance.  7.   GAIT WITH NARROW BASE OF SUPPORT Instructions: Walk on the floor with arms folded across the chest, feet aligned heel to toe in tandem for a distance of 3.6 m [12 ft]. The number of steps taken in a straight line are counted for a maximum of 10 steps. (2) Mild impairment - Ambulates 7-9 steps  8.   GAIT WITH EYES CLOSED Instructions: Walk at your normal speed from here to the next mark (6 m [20 ft]) with your eyes closed. (2) Mild impairment - Walks 6 m (20 ft), uses assistive device, slower speed, mild gait deviations, deviates 15.24 -25.4 cm (6 -10 in) outside 30.48-cm (12-in) walkway width. Ambulates 6 m (20 ft) in less than 9 seconds but greater than 7 seconds 7.72 seconds  9.   AMBULATING BACKWARDS Instructions: Walk backwards until I tell you to stop (2) Mild impairment - Walks 6 m (20 ft), uses assistive device, slower speed, mild gait deviations, deviates 15.24 -25.4 cm (6 -10 in) outside 30.48-cm (12-in) walkway width12.06 seconds  10. STEPS Instructions: Walk up these stairs as you would at home (ie, using the rail if necessary). At the top turn around and walk down. (1)  Moderate impairment-Two feet to a stair; must use rail.  Total 18/30   Interpretation of scores: Non-Specific Older Adults Cutoff Score: <=22/30 = risk of falls Parkinson's Disease Cutoff score <15/30= fall risk (Hoehn & Yahr 1-4)  Minimally Clinically Important Difference (MCID)  Stroke (acute, subacute, and chronic) = MDC: 4.2 points Vestibular (acute) = MDC: 6 points Community Dwelling Older Adults =  MCID: 4 points Parkinson's Disease  =  MDC: 4.3 points  (Academy of Neurologic Physical Therapy (nd). Functional Gait Assessment. Retrieved from https://www.neuropt.org/docs/default-source/cpgs/core-outcome-measures/function-gait-assessment-pocket-guide-proof9-(2).pdf?sfvrsn=b74f35043_0.)  TREATMENT DATE: 02/13/24  Self Care: BP and HR taken in sitting at rest beginning of session (see below for details).  Pt reporting no lightheadedness/dizziness or HA.  Symptoms monitored for during session. Vitals:   02/13/24 0857  BP: (!) 139/96  Pulse: 84  End of session HR 80 bpm at rest.  Therapeutic Exercise: Ambulation (no AD use; SBA) to improve cardiovascular endurance: x260 feet  x200 feet holding 6# medicine ball chest level with B hands Standing exercises in // bars (to improve hip stabilization/strength): x10 reps x2 sets hip aBduction B LE's x10 reps x2 sets hip extension B LE's  Therapeutic Activity: Ambulation (no AD use; SBA) to improve foot clearance: x230 feet with 5# B ankle weights Sit to stands (from 21.5 inch height mat table) to improve ease of transfers: x10 reps x3 sets holding 6# medicine ball chest level with B hands Balance in // bars on 9 foot compliant balance beam: Side stepping (R/L = 1 rep): x3 reps no UE support; occasional LOB but pt able to self correct Heel to toe ambulation forward x6 reps: intermittent R UE support on // bars;  SBA Standing on wobble board (lateral) in // bars:  x1 minute x 2 reps with occasional UE support Mini squats (no UE support): x10 reps x2 sets (CGA for safety; pt able to self correct balance)  PATIENT EDUCATION: Education details: Schedule SLP eval (pt thought he had scheduled it but no appt noted).  Continue HEP.  Reviewed number of remaining visits. Person educated: Patient Education method: Explanation and Verbal cues Education comprehension: verbalized understanding  HOME EXERCISE PROGRAM: 01/13/24 Access Code: [email or web address] URL: [email or web address] Date: 01/13/2024 Prepared by: Damien Caulk  Exercises - Standing March with Counter Support  - 1 x daily - 5 x weekly - 2 sets - 10 reps - Standing Hip Abduction with Counter Support  - 1 x daily - 5 x weekly - 2 sets - 10 reps - Standing Single Leg Stance with Counter Support  - 1 x daily - 5 x weekly - 2 sets - 1 reps - 30 second hold - Supine Bridge  - 1 x daily - 5 x weekly - 2 sets - 10 reps - Clamshell  - 1 x daily - 5 x weekly - 2 sets - 10 reps  GOALS: Goals reviewed with patient? Yes  SHORT TERM GOALS: Target date: 01/20/2024 (assessed 01/20/24)  Patient will demo at least 50% compliance with HEP to self manage symptoms.  Baseline: Goal status: MET (pt reports 50% compliance)  2.  Assess FGA Baseline: 18/30 (12/26/23) Goal status: MET  3.  Assess TUG (Cognitive) Baseline: 26.25 seconds Goal status: MET   LONG TERM GOALS: Target date: 02/17/2024   Pt will decrease 5 Time Sit to Stand by at least 3 seconds in order to demonstrate clinically significant improvement in LE strength.  Baseline: 20.81 seconds (Eval) Goal status: INITIAL  2.  Pt will decrease TUG (Cognitive) by at least (or equal to) 3 seconds in order to demonstrate decreased fall risk. Baseline: 26.25 seconds  Goal status: INITIAL  3.  Patient will demo at least 5 points of improvement on FGA to improve functional balance and  reduce fall risk. Baseline: 18/30 (12/26/23) Goal status: INITIAL  4.  Pt will be independent with final HEP in order to improve strength and balance, to decrease fall risk, and improve function at home for ADL's and at work. Baseline:  Goal status: INITIAL   ASSESSMENT:  CLINICAL IMPRESSION:  Patient was seen today for physical therapy treatment to address balance, gait, and cardiovascular activity tolerance.  Focused session on dynamic balance (on compliant surface; on wobble board), improving B LE foot clearance (use of ankle weights), LE strengthening, and improving cardiovascular activity tolerance .  Patient continues to be limited by strength and balance.  They demonstrate overall improvement in activity tolerance during today's session.  They would continue to benefit from skilled PT to address impairments as noted and progress towards long term goals.  OBJECTIVE IMPAIRMENTS: Abnormal gait, cardiopulmonary status limiting activity, decreased activity tolerance, decreased balance, decreased cognition, decreased coordination, decreased endurance, decreased knowledge of condition, decreased knowledge of use of DME, decreased mobility, difficulty walking, decreased strength, decreased safety awareness, increased fascial restrictions, impaired flexibility, impaired sensation, impaired tone, impaired UE functional use, impaired vision/preception, improper body mechanics, and pain.   ACTIVITY LIMITATIONS: carrying, lifting, bending, standing, squatting, sleeping, stairs, transfers, locomotion level, and caring for others  PARTICIPATION LIMITATIONS: meal prep, cleaning, laundry, medication management, driving, shopping, community activity, occupation, and yard work  PERSONAL FACTORS: Age, Time since onset of injury/illness/exacerbation, and 1-2 comorbidities: CHF, hx of CVA, ESRD are also affecting patient's functional outcome.   REHAB POTENTIAL: Good  CLINICAL DECISION MAKING:  Evolving/moderate complexity  EVALUATION COMPLEXITY: Moderate  PLAN:  PT FREQUENCY: 2x/week  PT DURATION: 8 weeks  PLANNED INTERVENTIONS: [id number]- PT Re-evaluation, 97750- Physical Performance Testing, 97110-Therapeutic exercises, 97530- Therapeutic activity, 97112- Neuromuscular re-education, 97535- Self Care, [id number]- Manual therapy, [phone]- Gait training, [phone]- Orthotic/Prosthetic subsequent, [phone]- Aquatic Therapy, [id number]- Electrical stimulation (unattended), Patient/Family education, Balance training, Stair training, Taping, Joint mobilization, Spinal mobilization, Cognitive remediation, DME instructions, Wheelchair mobility training, Cryotherapy, and Moist heat  PLAN FOR NEXT SESSION: LTG's due.  Did pt schedule SLP eval?; higher level balance with cognitive tasks/dual tasks; BlazePods; challenge awareness of L side, L hip abd strengthening, quadruped, half kneel, turning   Damien Caulk, PT 02/13/2024, 6:49 PM

## 2024-02-14 NOTE — Progress Notes (Signed)
 02/14/2024 Chart Review  Transplant Episode Phase/Status: Kidney Transplant Waitlist - Qualified: 01/23/2020 - Inactive - [facility]  Status 7, Risk= 1, KDPI >85%= Npc, Hep B= Y, Hep C(Nat+)=N , Karnofsky= 80, Dual Kidney= N, PVD=N                                            RECENT HEALTH UPDATE(S):  - undergoing Uro work up for L renal mass, planned nephrectomy 01.07.26 - recent CVA (2025)   Pt should recover from nephrectomy and CVA (functionally) prior to being considered for active status again on the wait list.   ESRD secondary to Hypertensive Nephrosclerosis  Hypertensive chronic kidney disease with stage 1 through stage 4 chronic kidney disease, or unspecified chronic kidney disease  Comorbid Conditions  . Alcohol dependence  . Congestive heart failure  . Other cardiac disease  . History of hypertension  . Tobacco use (current smoker)  . Drug dependence     Nephrologist:   [place]  Dialysis Chronic Start Date:  04/14/2021   Maintenance Dialysis History      Start End Type Center Comments   04/14/2021  In-center Hemodialysis [facility] [place] Converted From [place]. TTS         Current Dialysis Center Information     [facility]     Phone: [phone] Fax:    Address: [address] [place] [address]                  NO OTHER CENTERS   Patient Active Problem List   Diagnosis Date Noted   . Left renal mass 01/20/2024  . Stroke    (CMD) 10/06/2023  . ESRD on hemodialysis    (CMD) 01/25/2022  . Morbid obesity with BMI of 40.0-44.9, adult (CMD) 01/25/2022  . Malignant hypertension 10/14/2016  . Hypertensive heart disease with CHF (congestive heart failure)    (CMD) 06/14/2016    Overview Note:    of this note might be different from the original. Grade 2 DD on echo   . NICM (nonischemic cardiomyopathy)    (CMD) 06/14/2016    Overview Note:    March 2018   . CKD (chronic kidney disease), stage IV    (CMD) 06/10/2016     Overview Note:    in July)   . Normocytic anemia 06/10/2016    Resolved Problems  No resolved problems to display.    Past Surgical History: No date: AV FISTULA PLACEMENT     Comment:  Procedure: AV FISTULA PLACEMENT  Upcoming Appointments as of 02/14/24 Scheduled Future Appointments       Provider Department Dept Phone Center   03/12/2024 12:30 PM [facility] PREOP ASSESSMENTS [facility] - PREOPERATIVE ASSESSMENTS CLINIC [phone] [facility] [place]

## 2024-02-17 ENCOUNTER — Ambulatory Visit: Admitting: Physical Therapy

## 2024-02-17 ENCOUNTER — Ambulatory Visit

## 2024-02-17 ENCOUNTER — Encounter: Payer: Self-pay | Admitting: Physical Therapy

## 2024-02-17 VITALS — BP 123/79 | HR 93

## 2024-02-17 DIAGNOSIS — R278 Other lack of coordination: Secondary | ICD-10-CM

## 2024-02-17 DIAGNOSIS — I69854 Hemiplegia and hemiparesis following other cerebrovascular disease affecting left non-dominant side: Secondary | ICD-10-CM

## 2024-02-17 DIAGNOSIS — M6281 Muscle weakness (generalized): Secondary | ICD-10-CM

## 2024-02-17 DIAGNOSIS — R29818 Other symptoms and signs involving the nervous system: Secondary | ICD-10-CM

## 2024-02-17 DIAGNOSIS — R208 Other disturbances of skin sensation: Secondary | ICD-10-CM

## 2024-02-17 DIAGNOSIS — R29898 Other symptoms and signs involving the musculoskeletal system: Secondary | ICD-10-CM

## 2024-02-17 NOTE — Therapy (Signed)
 OUTPATIENT PHYSICAL THERAPY NEURO TREATMENT: DISCHARGE NOTE  PHYSICAL THERAPY DISCHARGE SUMMARY  Visits from Start of Care: 15  Current functional level related to goals / functional outcomes: Independent with ambulation.   See below for additional details (goals/functional outcomes).   Remaining deficits: Higher level balance   Education / Equipment: Continue going to gym and doing HEP.   Patient agrees to discharge. Patient goals were met. Patient is being discharged due to meeting the stated rehab goals.  Pt reports being happy with current status.  Damien Caulk, PT   Patient Name: Nathan Campbell MRN: [id number] DOB:1976/03/03, 48 y.o., male Today's Date: 02/17/2024   PCP: Pt reports PCP is Bl[facility] but did not give any further identifying information. REFERRING PROVIDER: Sebastian Jerilyn HERO, FNP  END OF SESSION:  PT End of Session - 02/17/24 0930     Visit Number 15    Number of Visits 17   16 plus Eval   Date for Recertification  02/24/24   For scheduling delays   Authorization Type [facility] Medicare    Authorization Time Period 12/26/23 - 02/20/24    Authorization - Visit Number 15    Authorization - Number of Visits 17    Progress Note Due on Visit 10    PT Start Time 0934   received from OT   PT Stop Time 1009    PT Time Calculation (min) 35 min    Equipment Utilized During Treatment Gait belt    Activity Tolerance Patient tolerated treatment well    Behavior During Therapy WFL for tasks assessed/performed           Past Medical History:  Diagnosis Date   Acute combined systolic and diastolic CHF, NYHA class 4 (HCC) 06/10/2016   Anemia    Cardiomyopathy (HCC) 06/14/2016   CHF (congestive heart failure) (HCC)    COVID 10/2020   Dyspnea    ESRD on hemodialysis (HCC)    TTS at [facility]   Hypertension    Hypertensive heart and kidney disease with heart failure (HCC) 06/14/2016   Hypertensive heart disease with congestive heart failure (HCC)     Obesity    Past Surgical History:  Procedure Laterality Date   A/V FISTULAGRAM Left 05/03/2022   Procedure: A/V Fistulagram;  Surgeon: Eliza Lonni RAMAN, MD;  Location: [facility] INVASIVE CV LAB;  Service: Cardiovascular;  Laterality: Left;   AV FISTULA PLACEMENT Left 11/11/2020   Procedure: LEFT ARM First Stage Basilic Vein Fistula Creation;  Surgeon: Sheree Penne Lonni, MD;  Location: [facility] OR;  Service: Vascular;  Laterality: Left;   BASCILIC VEIN TRANSPOSITION Left 01/23/2021   Procedure: Second Stage Basilic Vein Transposition of Left Arm Arteriovenous  Fistula;  Surgeon: Sheree Penne Lonni, MD;  Location: [facility] OR;  Service: Vascular;  Laterality: Left;  Block  to LMA    COLONOSCOPY     FISTULA SUPERFICIALIZATION Left 05/07/2022   Procedure: PLICATION OF ANEURYSM, LEFT ARM FISTULA;  Surgeon: Eliza Lonni RAMAN, MD;  Location: [facility] OR;  Service: Vascular;  Laterality: Left;   IR CT HEAD LTD  10/07/2023   IR CT HEAD LTD  10/07/2023   IR PATIENT EVAL TECH 0-60 MINS  10/07/2023   IR PERCUTANEOUS ART THROMBECTOMY/INFUSION INTRACRANIAL INC DIAG ANGIO  10/07/2023   NO PAST SURGERIES     PERIPHERAL VASCULAR BALLOON ANGIOPLASTY Left 05/03/2022   Procedure: PERIPHERAL VASCULAR BALLOON ANGIOPLASTY;  Surgeon: Eliza Lonni RAMAN, MD;  Location: [facility] INVASIVE CV LAB;  Service: Cardiovascular;  Laterality: Left;  AVF  RADIOLOGY WITH ANESTHESIA N/A 10/07/2023   Procedure: RADIOLOGY WITH ANESTHESIA;  Surgeon: Radiologist, Medication, MD;  Location: [facility] OR;  Service: Radiology;  Laterality: N/A;   Patient Active Problem List   Diagnosis Date Noted   Stroke (HCC) 11/04/2023   Cognitive and neurobehavioral dysfunction 10/24/2023   Acute hypoxic respiratory failure (HCC) 10/17/2023   Essential hypertension 10/17/2023   Dysphagia 10/17/2023   Leukocytosis 10/17/2023   Obesity 10/17/2023   Acute ischemic stroke (HCC) 10/07/2023   Middle cerebral artery embolism, right 10/07/2023   ESRD (end stage  renal disease) (HCC) 03/26/2022   Hyperlipidemia, unspecified 03/20/2021   Acute gout due to renal impairment involving left wrist 04/24/2019   Malignant hypertension 10/14/2016   Combined congestive systolic and diastolic heart failure (HCC) 06/14/2016   NICM (nonischemic cardiomyopathy) (HCC) 06/14/2016   Renal failure    Hypertensive urgency 06/10/2016   CKD (chronic kidney disease), stage IV (HCC) 06/10/2016   Normocytic anemia 06/10/2016    ONSET DATE: 12/14/2023- date of referral  REFERRING DIAG: G81.90 (ICD-10-CM) - Hemiparesis (HCC)  THERAPY DIAG:  Hemiplegia and hemiparesis following other cerebrovascular disease affecting left non-dominant side (HCC)  Muscle weakness (generalized)  Other lack of coordination  Other symptoms and signs involving the musculoskeletal system  Rationale for Evaluation and Treatment: Rehabilitation  SUBJECTIVE:  Likes to be called Nathan Campbell.                                                                                                                                                                                            SUBJECTIVE STATEMENT: Missed SLP eval this morning (thought it was at 8:45 am) but got it rescheduled.  Feeling good and has been driving.  No recent falls.  No acute changes.  Moving to new place hopefully soon (applying to a new place). Pt accompanied by: self.  Drove self.  PERTINENT HISTORY: Hospitalization 10/07/23 for acute onset of left-sided weakness, left side neglect, dysarthria and right gaze preference; imaging showing acute right MCA CVA with right M1 occlusion (intubated for airway protection; s/p TNK; s/p revascularization of R MCA with IR 7/25).  IP rehab stay 10/18/23-11/04/23. Urgent care visit 12/02/23 for gout (L foot).  PMH includes: gout, CKD, ESRD on HD T/R/Sat, CHF, anemia, htn, stroke 10/07/23, acute hypoxic respiratory failure, dysphagia, cardiomyopathy, and obesity (BMI 41).  PAIN:  Are you having pain? Yes:  NPRS scale: 0/10 currently Pain location: L shoulder Pain description: Aching Aggravating factors: Laying down Relieving factors: Muscle relaxer  PRECAUTIONS: Fall; fistula in LUE (dialysis T/Th/Sat)  RED FLAGS: None   WEIGHT BEARING RESTRICTIONS: No  FALLS: Has patient fallen in  last 6 months? No  LIVING ENVIRONMENT: Lives with: Sister and her 2 sons; mom currently staying with pt to assist as needed Lives in: Other Townhouse--stays on 2nd floor Stairs: Yes: Internal: 5 steps; on right going up and External: 0 steps; none Has following equipment at home: Crutches  PLOF: Independent  PATIENT GOALS: To get more function in arm; get voice back; get strength up.  OBJECTIVE:  Note: Objective measures were completed at Evaluation unless otherwise noted.  COGNITION: Overall cognitive status: A&Ox4; possible decreased awareness of deficits  COORDINATION:  Rapid Alternating toe taps (in sitting): Decreased L LE  Heel to shin (in sitting); mild decreased L LE  LOWER EXTREMITY ROM:     Active  Right Eval Left Eval  Hip flexion WNL WNL  Hip extension    Hip abduction    Hip adduction    Hip internal rotation    Hip external rotation    Knee flexion WNL WNL  Knee extension WNL WNL  Ankle dorsiflexion WNL WNL  Ankle plantarflexion WNL WNL  Ankle inversion    Ankle eversion     (Blank rows = not tested)  LOWER EXTREMITY MMT:    MMT Right Eval Left Eval  Hip flexion 5/5 4+/5  Hip extension    Hip abduction    Hip adduction    Hip internal rotation    Hip external rotation    Knee flexion 5/5 4+/5  Knee extension 5/5 5/5  Ankle dorsiflexion 5/5 4+/5  Ankle plantarflexion At least 3/5 AROM At least 3/5 AROM  Ankle inversion    Ankle eversion    (Blank rows = not tested) GAIT: Findings: Gait Characteristics: step through pattern, decreased arm swing- Left, decreased ankle dorsiflexion- Left, and wide BOS, Distance walked: clinic distances, Assistive device  utilized:None, Level of assistance: SBA, and Comments: pt tending to walk close to items on L side but did not bump into anything  FUNCTIONAL TESTS:  5 times sit to stand: 20.81 seconds (eval); 15.19 seconds 02/17/24 10 meter walk test: 1.25 m/sec (7.97 seconds); no AD use on eval. Functional gait assessment: 18/30 (12/26/23); 26/30 02/17/24 SLS: R LE 1st 2 trials about 1 second each but on 3rd trial 9.0 seconds; L LE 1st 2 trials about 1 second each but on 3rd trial 30 seconds L LE (time stopped by therapist at 30 seconds) TUG (Manual): 9.41 seconds 12/26/23 TUG (Cognitive): 26.25 seconds 12/26/23; 15.56 seconds 02/17/24  FUNCTIONAL GAIT ASSESSMENT  Date: 12/26/23 Score  GAIT LEVEL SURFACE Instructions: Walk at your normal speed from here to the next mark (6 m) [20 ft]. (1) Moderate impairment-Walks 6 m (20 ft), slow speed, abnormal gait pattern, evidence for imbalance, or deviates 25.4 - 38.1 cm (10 -15 in) outside of the 30.48-cm (12-in) walkway width. Requires more than 7 seconds to ambulate 6 m (20 ft). 8.53 seconds  2.   CHANGE IN GAIT SPEED Instructions: Begin walking at your normal pace (for 1.5 m [5 ft]). When I tell you "go," walk as fast as you can (for 1.5 m [5 ft]). When I tell you "slow," walk as slowly as you can (for 1.5 m [5 ft]. (2) Mild impairment - Is able to change speed but demonstrates mild gait deviations, deviates 15.24 -25.4 cm (6 -10 in) outside of the 30.48-cm (12-in) walkway width, or no gait deviations but unable to achieve a significant change in velocity, or uses an assistive device  3.    GAIT WITH HORIZONTAL HEAD TURNS Instructions:  Walk from here to the next mark 6 m (20 ft) away. Begin walking at your normal pace. Keep walking straight; after 3 steps, turn your head to the right and keep walking straight while looking to the right. After 3 more steps, turn your head to the left and keep walking straight while looking left. Continue alternating looking right and left.  (2) Mild impairment - Performs head turns smoothly with slight change in gait velocity (eg, minor disruption to smooth gait path), deviates 15.24 -25.4 cm (6 -10 in) outside 30.48-cm (12-in) walkway width, or uses an assistive device.  4.   GAIT WITH VERTICAL HEAD TURNS Instructions: Walk from here to the next mark (6 m [20 ft]). Begin walking at your normal pace. Keep walking straight; after 3 steps, tip your head up and keep walking straight while looking up. After 3 more steps, tip your head down, keep walking straight while looking down. Continue  alternating looking up and down every 3 steps until you have completed 2 repetitions in each direction. (2) Mild impairment - Performs task with slight change in gait velocity (eg, minor disruption to smooth gait path), deviates 15.24 -25.4 cm (6 -10 in) outside 30.48-cm (12-in) walkway width or uses assistive device.  5.  GAIT AND PIVOT TURN Instructions: Begin with walking at your normal pace. When I tell you, "turn and stop," turn as quickly as you can to face the opposite direction and stop. (2) Mild impairment - Pivot turns safely in 3 seconds and stops with no loss of balance, or pivot turns safely within 3 seconds and stops with mild imbalance, requires small steps to catch balance  6.   STEP OVER OBSTACLE Instructions: Begin walking at your normal speed. When you come to the shoe box, step over it, not around it, and keep walking. (2) Mild impairment - Is able to step over one shoe box (11.43 cm [4.5 in] total height) without changing gait speed; no evidence of imbalance.  7.   GAIT WITH NARROW BASE OF SUPPORT Instructions: Walk on the floor with arms folded across the chest, feet aligned heel to toe in tandem for a distance of 3.6 m [12 ft]. The number of steps taken in a straight line are counted for a maximum of 10 steps. (2) Mild impairment - Ambulates 7-9 steps  8.   GAIT WITH EYES CLOSED Instructions: Walk at your normal speed from here to the  next mark (6 m [20 ft]) with your eyes closed. (2) Mild impairment - Walks 6 m (20 ft), uses assistive device, slower speed, mild gait deviations, deviates 15.24 -25.4 cm (6 -10 in) outside 30.48-cm (12-in) walkway width. Ambulates 6 m (20 ft) in less than 9 seconds but greater than 7 seconds 7.72 seconds  9.   AMBULATING BACKWARDS Instructions: Walk backwards until I tell you to stop (2) Mild impairment - Walks 6 m (20 ft), uses assistive device, slower speed, mild gait deviations, deviates 15.24 -25.4 cm (6 -10 in) outside 30.48-cm (12-in) walkway width12.06 seconds  10. STEPS Instructions: Walk up these stairs as you would at home (ie, using the rail if necessary). At the top turn around and walk down. (1) Moderate impairment-Two feet to a stair; must use rail.  Total 18/30    FUNCTIONAL GAIT ASSESSMENT  Date: 02/17/24 Score  GAIT LEVEL SURFACE Instructions: Walk at your normal speed from here to the next mark (6 m) [20 ft]. (2) Mild impairment - Walks 6 m (20 ft) in less  than 7 seconds but greater than 5.5 seconds, uses assistive device, slower speed, mild gait deviations, or deviates 15.24 -25.4 cm (6 -10 in) outside of the 30.48-cm (12-in) walkway width. 6.09 seconds  2.   CHANGE IN GAIT SPEED Instructions: Begin walking at your normal pace (for 1.5 m [5 ft]). When I tell you "go," walk as fast as you can (for 1.5 m [5 ft]). When I tell you "slow," walk as slowly as you can (for 1.5 m [5 ft]. (3) Normal - Able to smoothly change walking speed without loss of balance or gait deviation. Shows a significant difference in walking speeds between normal, fast, and slow speeds. Deviates no more than 15.24 cm (6 in) outside of the 30.48-cm (12-in) walkway width.  3.    GAIT WITH HORIZONTAL HEAD TURNS Instructions: Walk from here to the next mark 6 m (20 ft) away. Begin walking at your normal pace. Keep walking straight; after 3 steps, turn your head to the right and keep walking straight while looking to  the right. After 3 more steps, turn your head to the left and keep walking straight while looking left. Continue alternating looking right and left. (3) Normal - Performs head turns smoothly with no change in gait. Deviates no more than 15.24 cm (6 in) outside 30.48-cm (12-in) walkway width.  4.   GAIT WITH VERTICAL HEAD TURNS Instructions: Walk from here to the next mark (6 m [20 ft]). Begin walking at your normal pace. Keep walking straight; after 3 steps, tip your head up and keep walking straight while looking up. After 3 more steps, tip your head down, keep walking straight while looking down. Continue  alternating looking up and down every 3 steps until you have completed 2 repetitions in each direction. (3) Normal - Performs head turns with no change in gait. Deviates no more than 15.24 cm (6 in) outside 30.48-cm (12-in) walkway width.  5.  GAIT AND PIVOT TURN Instructions: Begin with walking at your normal pace. When I tell you, "turn and stop," turn as quickly as you can to face the opposite direction and stop. (3) Normal - Pivot turns safely within 3 seconds and stops quickly with no loss of balance  6.   STEP OVER OBSTACLE Instructions: Begin walking at your normal speed. When you come to the shoe box, step over it, not around it, and keep walking. (3) Normal - Is able to step over 2 stacked shoe boxes taped together (22.86 cm [9 in] total height) without changing gait speed; no evidence of imbalance.  7.   GAIT WITH NARROW BASE OF SUPPORT Instructions: Walk on the floor with arms folded across the chest, feet aligned heel to toe in tandem for a distance of 3.6 m [12 ft]. The number of steps taken in a straight line are counted for a maximum of 10 steps. (3) Normal - Is able to ambulate for 10 steps heel to toe with no staggering.  8.   GAIT WITH EYES CLOSED Instructions: Walk at your normal speed from here to the next mark (6 m [20 ft]) with your eyes closed. (3) Normal - Walks 6 m (20 ft), no  assistive devices, good speed, no evidence of imbalance, normal gait pattern, deviates no more than 15.24 cm (6 in) outside 30.48-cm (12-in) walkway width. Ambulates 6 m (20 ft) in less than 7 seconds. 6.09 seconds  9.   AMBULATING BACKWARDS Instructions: Walk backwards until I tell you to stop (2) Mild impairment -  Walks 6 m (20 ft), uses assistive device, slower speed, mild gait deviations, deviates 15.24 -25.4 cm (6 -10 in) outside 30.48-cm (12-in) walkway width 12.71 seconds  10. STEPS Instructions: Walk up these stairs as you would at home (ie, using the rail if necessary). At the top turn around and walk down. (1) Moderate impairment-Two feet to a stair; must use rail.  Total 26/30   Interpretation of scores: Non-Specific Older Adults Cutoff Score: <=22/30 = risk of falls Parkinson's Disease Cutoff score <15/30= fall risk (Hoehn & Yahr 1-4)  Minimally Clinically Important Difference (MCID)  Stroke (acute, subacute, and chronic) = MDC: 4.2 points Vestibular (acute) = MDC: 6 points Community Dwelling Older Adults =  MCID: 4 points Parkinson's Disease  =  MDC: 4.3 points  (Academy of Neu[employer]ic Physical Therapy (nd). Functional Gait Assessment. Retrieved from https://www.neuropt.org/docs/default-source/cpgs/core-outcome-measures/function-gait-assessment-pocket-guide-proof9-(2).pdf?sfvrsn=b93f35043_0.)                                                                                                                              TREATMENT DATE: 02/17/24  Self Care: BP and HR taken in sitting at rest beginning of session (see below for details). Vitals:   02/17/24 0936  BP: 123/79  Pulse: 93   Physical Performance Testing (see LTG's and clinical impression below for additional details): 5 time sit to stand: 15.19 seconds (no UE support) TUG (cognitive): 15.56 seconds (pt with difficulty with numbers during task but similar to initial assessment) FGA: 26/30 (see above for  details)  PATIENT EDUCATION: Education details: LTG's results.  Continue HEP.  Discharge from PT today (pt met 4/4 LTG's) Person educated: Patient Education method: Explanation and Verbal cues Education comprehension: verbalized understanding  HOME EXERCISE PROGRAM: 01/13/24 Access Code: [email or web address] URL: [email or web address] Date: 01/13/2024 Prepared by: Damien Caulk  Exercises - Standing March with Counter Support  - 1 x daily - 5 x weekly - 2 sets - 10 reps - Standing Hip Abduction with Counter Support  - 1 x daily - 5 x weekly - 2 sets - 10 reps - Standing Single Leg Stance with Counter Support  - 1 x daily - 5 x weekly - 2 sets - 1 reps - 30 second hold - Supine Bridge  - 1 x daily - 5 x weekly - 2 sets - 10 reps - Clamshell  - 1 x daily - 5 x weekly - 2 sets - 10 reps  GOALS: Goals reviewed with patient? Yes  SHORT TERM GOALS: Target date: 01/20/2024 (assessed 01/20/24)  Patient will demo at least 50% compliance with HEP to self manage symptoms.  Baseline: Goal status: MET (pt reports 50% compliance)  2.  Assess FGA Baseline: 18/30 (12/26/23) Goal status: MET  3.  Assess TUG (Cognitive) Baseline: 26.25 seconds Goal status: MET   LONG TERM GOALS: Target date: 02/17/2024   Pt will decrease 5 Time Sit to Stand by at least 3 seconds in order to demonstrate clinically significant improvement in LE strength.  Baseline: 20.81 seconds (Eval); 15.19 seconds 02/17/24 Goal status: MET  2.  Pt will decrease TUG (Cognitive) by at least (or equal to) 3 seconds in order to demonstrate decreased fall risk. Baseline: 26.25 seconds 12/26/23; 15.56 seconds 02/17/24 Goal status: MET  3.  Patient will demo at least 5 points of improvement on FGA to improve functional balance and reduce fall risk. Baseline: 18/30 (12/26/23); 26/30 02/17/24 Goal status: MET  4.  Pt will be independent with final HEP in order to improve strength and balance, to decrease fall risk, and  improve function at home for ADL's and at work. Baseline: Issued Goal status: MET   ASSESSMENT:  CLINICAL IMPRESSION: Patient was seen today for physical therapy treatment to address LTG's and POC.  Physical performance tests during session included 5 time sit to stand, TUG cognitive, and FGA.  Pt scored 15.19 seconds on the 5 time sit to stand test which is improved from 20.81 seconds on eval (>15 seconds = increased risk of falls).  Pt scored 15.56 seconds on the TUG (cognitive) which is improved from 26.25 seconds 12/26/23 (cut of score = 15.0 seconds with MDC 2.9 seconds).  Pt scored 26/30 on FGA today (improved from 18/30 on 12/26/23) indicating pt is not at risk of falls (</= 22/30 = risk of falls in elderly).  Pt met 4/4 LTG's.  Pt reports feeling happy with current status and pt agreeable to discharge from PT today.  Pt reports no questions or concerns for discharge and plans to continue going to gym 1-2x's/week.  Pt plans to continue with OP OT and has SLP eval scheduled for later this month.  Plan to discharge from OP PT today.  OBJECTIVE IMPAIRMENTS: Abnormal gait, cardiopulmonary status limiting activity, decreased activity tolerance, decreased balance, decreased cognition, decreased coordination, decreased endurance, decreased knowledge of condition, decreased knowledge of use of DME, decreased mobility, difficulty walking, decreased strength, decreased safety awareness, increased fascial restrictions, impaired flexibility, impaired sensation, impaired tone, impaired UE functional use, impaired vision/preception, improper body mechanics, and pain.   ACTIVITY LIMITATIONS: carrying, lifting, bending, standing, squatting, sleeping, stairs, transfers, locomotion level, and caring for others  PARTICIPATION LIMITATIONS: meal prep, cleaning, laundry, medication management, driving, shopping, community activity, occupation, and yard work  PERSONAL FACTORS: Age, Time since onset of  injury/illness/exacerbation, and 1-2 comorbidities: CHF, hx of CVA, ESRD are also affecting patient's functional outcome.   REHAB POTENTIAL: Good  CLINICAL DECISION MAKING: Evolving/moderate complexity  EVALUATION COMPLEXITY: Moderate  PLAN:  PT FREQUENCY: 2x/week  PT DURATION: 8 weeks  PLANNED INTERVENTIONS: [id number]- PT Re-evaluation, 97750- Physical Performance Testing, 97110-Therapeutic exercises, 97530- Therapeutic activity, 97112- Neuromuscular re-education, 97535- Self Care, [id number]- Manual therapy, [phone]- Gait training, [phone]- Orthotic/Prosthetic subsequent, [phone]- Aquatic Therapy, [id number]- Electrical stimulation (unattended), Patient/Family education, Balance training, Stair training, Taping, Joint mobilization, Spinal mobilization, Cognitive remediation, DME instructions, Wheelchair mobility training, Cryotherapy, and Moist heat  PLAN FOR NEXT SESSION: Discharge from PT 02/17/24   Damien Caulk, PT 02/17/2024, 10:36 AM

## 2024-02-17 NOTE — Therapy (Addendum)
 OUTPATIENT OCCUPATIONAL THERAPY NEURO TREATMENT AND RE-CERT   Patient Name: Nathan Campbell MRN: [id number] DOB:04/14/1975, 48 y.o., male Today's Date: 02/17/2024  PCP: None REFERRING PROVIDER: Sebastian Jerilyn HERO, FNP  END OF SESSION:   02/17/24 0928  OT Visits / Re-Eval  Visit Number 12  Number of Visits 22  Date for Recertification  05/11/24  Authorization  Authorization Type [employer] [facility]  Authorization Time Period 12/30/23-02/24/24  Authorization - Visit Number 12  Authorization - Number of Visits 17  OT Time Calculation  OT Start Time 0848  OT Stop Time 0930  OT Time Calculation (min) 42 min  End of Session  Equipment Utilized During Treatment testing materials, FM items  Activity Tolerance Patient tolerated treatment well  Behavior During Therapy WFL for tasks assessed/performed     Past Medical History:  Diagnosis Date   Acute combined systolic and diastolic CHF, NYHA class 4 (HCC) 06/10/2016   Anemia    Cardiomyopathy (HCC) 06/14/2016   CHF (congestive heart failure) (HCC)    COVID 10/2020   Dyspnea    ESRD on hemodialysis (HCC)    TTS at [facility]   Hypertension    Hypertensive heart and kidney disease with heart failure (HCC) 06/14/2016   Hypertensive heart disease with congestive heart failure (HCC)    Obesity    Past Surgical History:  Procedure Laterality Date   A/V FISTULAGRAM Left 05/03/2022   Procedure: A/V Fistulagram;  Surgeon: Eliza Lonni RAMAN, MD;  Location: [facility] INVASIVE CV LAB;  Service: Cardiovascular;  Laterality: Left;   AV FISTULA PLACEMENT Left 11/11/2020   Procedure: LEFT ARM First Stage Basilic Vein Fistula Creation;  Surgeon: Sheree Penne Lonni, MD;  Location: [facility] OR;  Service: Vascular;  Laterality: Left;   BASCILIC VEIN TRANSPOSITION Left 01/23/2021   Procedure: Second Stage Basilic Vein Transposition of Left Arm Arteriovenous  Fistula;  Surgeon: Sheree Penne Lonni, MD;  Location: [facility] OR;  Service: Vascular;   Laterality: Left;  Block  to LMA    COLONOSCOPY     FISTULA SUPERFICIALIZATION Left 05/07/2022   Procedure: PLICATION OF ANEURYSM, LEFT ARM FISTULA;  Surgeon: Eliza Lonni RAMAN, MD;  Location: [facility] OR;  Service: Vascular;  Laterality: Left;   IR CT HEAD LTD  10/07/2023   IR CT HEAD LTD  10/07/2023   IR PATIENT EVAL TECH 0-60 MINS  10/07/2023   IR PERCUTANEOUS ART THROMBECTOMY/INFUSION INTRACRANIAL INC DIAG ANGIO  10/07/2023   NO PAST SURGERIES     PERIPHERAL VASCULAR BALLOON ANGIOPLASTY Left 05/03/2022   Procedure: PERIPHERAL VASCULAR BALLOON ANGIOPLASTY;  Surgeon: Eliza Lonni RAMAN, MD;  Location: [facility] INVASIVE CV LAB;  Service: Cardiovascular;  Laterality: Left;  AVF   RADIOLOGY WITH ANESTHESIA N/A 10/07/2023   Procedure: RADIOLOGY WITH ANESTHESIA;  Surgeon: Radiologist, Medication, MD;  Location: [facility] OR;  Service: Radiology;  Laterality: N/A;   Patient Active Problem List   Diagnosis Date Noted   Stroke (HCC) 11/04/2023   Cognitive and neurobehavioral dysfunction 10/24/2023   Acute hypoxic respiratory failure (HCC) 10/17/2023   Essential hypertension 10/17/2023   Dysphagia 10/17/2023   Leukocytosis 10/17/2023   Obesity 10/17/2023   Acute ischemic stroke (HCC) 10/07/2023   Middle cerebral artery embolism, right 10/07/2023   ESRD (end stage renal disease) (HCC) 03/26/2022   Hyperlipidemia, unspecified 03/20/2021   Acute gout due to renal impairment involving left wrist 04/24/2019   Malignant hypertension 10/14/2016   Combined congestive systolic and diastolic heart failure (HCC) 06/14/2016   NICM (nonischemic cardiomyopathy) (HCC) 06/14/2016   Renal  failure    Hypertensive urgency 06/10/2016   CKD (chronic kidney disease), stage IV (HCC) 06/10/2016   Normocytic anemia 06/10/2016    ONSET DATE: 12/19/2023 referral date Admit date: 10/18/2023 Discharge date: 11/04/2023  REFERRING DIAG: G81.90 (ICD-10-CM) - Hemiplegia, unspecified affecting unspecified side  THERAPY DIAG:   Hemiplegia and hemiparesis following other cerebrovascular disease affecting left non-dominant side (HCC) - Plan: Ot plan of care cert/re-cert  Muscle weakness (generalized) - Plan: Ot plan of care cert/re-cert  Other lack of coordination - Plan: Ot plan of care cert/re-cert  Other symptoms and signs involving the musculoskeletal system - Plan: Ot plan of care cert/re-cert  Other symptoms and signs involving the nervous system - Plan: Ot plan of care cert/re-cert  Other disturbances of skin sensation - Plan: Ot plan of care cert/re-cert  Rationale for Evaluation and Treatment: Rehabilitation  SUBJECTIVE:   SUBJECTIVE STATEMENT: Pt reports he got the [facility] glove and he has tried it out, stated it went pretty good.   Pt accompanied by: self  PERTINENT HISTORY: Pt is a 48 yo male presenting to this clinic with hemiplegia s/p R MCA embolism. Pt was hospitalized for this from 10/07/23-10/18/23, then discharged to neurology and received IP PT/OT/SLP 10/18/23-11/04/23. Initial CT head revealed right MCA territory ischemic changes with calcific density in proximal right M1 concerning for calcific embolus.   CHF, CM, ESRD on HD TTS, HTN, obesity, HLD  PRECAUTIONS: Fall, L sided weakness, blood thinners  WEIGHT BEARING RESTRICTIONS: No  PAIN:  Are you having pain? No  FALLS: Has patient fallen in last 6 months? No  LIVING ENVIRONMENT: Lives with: lives with their family sister and her 2 sons. Pt reports will be getting own place soon. Lives in: House/apartment townhouse, 2 level Stairs: Yes: Internal: 3 steps; on right going up Has following equipment at home: None  PLOF: Independent and Vocation/Vocational requirements: Worked in distribution in [employer]   PATIENT GOALS: Get back movement in my left arm, return to work, and be able to play video games like I was before.  OBJECTIVE:  Note: Objective measures were completed at Evaluation unless otherwise noted.  HAND DOMINANCE:  Right  ADLs: Overall ADLs: Independent Transfers/ambulation related to ADLs: Independent Eating: Independent Grooming: Independent UB Dressing: Independent LB Dressing: Independent Toileting: Independent Bathing: Independent Tub Shower transfers: Independent, shower/tub combo Equipment: none  IADLs: Shopping: Independent Light housekeeping: Mom is doing pharmacologist.  Meal Prep: Needs some help with opening cans and jars d/t weakness in L hand Community mobility: Mother driving pt to appts  Medication management: Mother helping with medication mgmt Financial management: Independent Handwriting: did not assess d/t pt report of no changes and pt dominant hand unaffected.   MOBILITY STATUS: Independent  POSTURE COMMENTS:  No Significant postural limitations Sitting balance: WFL  ACTIVITY TOLERANCE: Activity tolerance: No changes  FUNCTIONAL OUTCOME MEASURES: Quick Dash: 47.7/100: 47.7% impairment 01/30/24: 52.3% impairment  02/17/24:  UPPER EXTREMITY ROM:  R WNL  Active ROM Right eval Left eval  Shoulder flexion  92  Shoulder abduction  70  Wrist flexion  10  Wrist extension  40  Wrist pronation    Wrist supination  Impaired (re-assess)  (Blank rows = not tested)  UPPER EXTREMITY MMT:   WFL RUE, 3-/5 grossly for LUE  HAND FUNCTION: Grip strength: Right: 61.2 lbs; Left: 15.8 lbs 01/20/24: Left: 19.4 lbs  COORDINATION: 9 Hole Peg test: Right: 41.60 sec; Left: unable to pick up a peg in 2 minutes sec Box and Blocks:  Right 36 blocks, Left 11blocks 01/20/24: Left: 15 blocks   SENSATION: WFL  EDEMA: none  MUSCLE TONE: LUE: Rigidity  COGNITION: Overall cognitive status: Within functional limits for tasks assessed  VISION: Subjective report: no concerns Baseline vision: No visual deficits Visual history: no hx  VISION ASSESSMENT: WFL To be further assessed in functional context  Patient has difficulty with following activities due to following visual  impairments: none  PERCEPTION: WFL  PRAXIS: Impaired: Ideomotor  OBSERVATIONS: Weakness in L hand and limited ROM in LUE affecting ability to complete ADL/IADL.                                                                                                                            TREATMENT: 02/17/24 Pt informed of re-cert. Took measurements for applicable goals.   Pt next educated in FM coordination activiites to improve functional use of L hand. See pt instructions for handout. Modifications were required d/t sequencing concerns and decreased ability to fully close L hand.    PATIENT EDUCATION: Education details: SEE ABOVE Person educated: Patient Education method: Explanation, Demonstration, Tactile cues, Verbal cues, and Handouts Education comprehension: verbalized understanding, returned demonstration, verbal cues required, and needs further education  HOME EXERCISE PROGRAM: 12/30/23: Shoulder, Forearm/wrist, and hand HEP  01/02/24: Theraputty HEP with red putty (ACCESS CODE [id number]) 01/11/24: Shoulder shrug exercises (same access code as above) 01/16/24: E-stim and saebo glove [employer] 01/20/24: Nonie opener [employer] 02/17/24: Coordination activities (see Pt instructions)   GOALS: Goals reviewed with patient? Yes  SHORT TERM GOALS: Target date: 02/15/24  Pt will be independent with LUE ROM, coordination, and grip strengthening HEP Baseline: New to OP OT Goal status: GOAL MET  2.  Pt will increase L shoulder abduction to at least 90 degrees for improved functional use  Baseline: 70 degrees  01/20/24: 90 degrees  Goal status:GOAL MET    3.  Pt will be independent with sleeping strategies for hemiparesis and hemiplegia of LUE Baseline: New to OP OT Goal status: GOAL MET  4.  Pt will be independent with resting hand splint wear and care for night use and to improve functional use in L hand Baseline: New to OP OT Goal status: DISCONTINUE AT THIS  TIME  5. Pt will be able to place at least 25 blocks using left hand with completion of Box and Blocks test.   Baseline: 15 blocks 01/30/24: 8 blocks 02/17/24: 14 blocks  Goal status: NOT MET   LONG TERM GOALS: Target date: 02/22/24  Pt will decrease QuickDASH score to no more than 25/100 to demonstrate improved function in daily tasks Baseline: 47.5/100 01/30/24: 52.3/100 02/17/24: 20.5/100 Goal status: GOAL MET  2.  Pt will increase L shoulder flexion ROM to at least 120 degrees for improved functional use Baseline: 92 degrees 01/20/24: 92 degrees  02/17/24: 150 degrees Goal status: GOAL MET  3.  Pt will increase L shoulder abduction ROM to at least 100 degrees for  improved functional use Baseline: 70 degrees 01/20/24:  90 degrees  01/30/24: 110 degrees, some compensation noted  Goal status: GOAL MET  4.  Pt will increase L wrist flexion and extension by at least 40 degrees and 20 degrees, respectively. Baseline: 10 degrees wrist flexion, 40 degrees wrist extension 01/20/24: 32 degrees wrist flexion, 63 degrees wrist extension 01/31/14: 82 degrees wrist flexion, 64 degrees wrist extension Goal status: GOAL MET  5. Patient will demonstrate at least 40 lbs L grip strength as needed to open jars and other containers.   Baseline: 19.2  01/30/24: 18.27 lbs  02/17/24: 25.5  Goal status:NOT MET    NEW SHORT TERM GOALS: Target date: 03/23/24  1. Pt will be able to place at least 20 blocks using left hand with completion of Box and Blocks test.   Baseline at initial eval: 15 blocks 01/30/24: 8 blocks 02/17/24: 14 blocks  NEW LONG TERM GOALS: Target date: 05/11/24 1. Patient will demonstrate at least 40 lbs L grip strength as needed to open jars and other containers.   Baseline at initial eval: 19.2  01/30/24: 18.27 lbs  02/17/24: 25.5 lbs average   ASSESSMENT:  CLINICAL IMPRESSION: This 2nd progress note/re-cert is for dates: 02/03/24 to 02/17/2024. Pt has met 3/5 STGs and 4/5  LTGs. Pt making progress towards goals as expected and continues to benefit from skilled OT services in the outpatient setting to work towards remaining goals or until max rehab potential is met.    PERFORMANCE DEFICITS: in functional skills including ADLs, IADLs, coordination, dexterity, tone, ROM, strength, pain, Fine motor control, body mechanics, cardiopulmonary status limiting function, and UE functional use, cognitive skills including attention, safety awareness, and sequencing, and psychosocial skills including coping strategies, interpersonal interactions, and routines and behaviors.   IMPAIRMENTS: are limiting patient from IADLs, rest and sleep, work, and leisure.   CO-MORBIDITIES: has co-morbidities such as ESRD that affects occupational performance. Patient will benefit from skilled OT to address above impairments and improve overall function.  REHAB POTENTIAL: Good   PLAN:  OT FREQUENCY: 1x/week  OT DURATION: additional 10 weeks  PLANNED INTERVENTIONS: 97168 OT Re-evaluation, 97535 self care/ADL training, 02889 therapeutic exercise, 97530 therapeutic activity, 97112 neuromuscular re-education, 97140 manual therapy, [id number] electrical stimulation (manual), 97760 Orthotic Initial, [id number] Orthotic/Prosthetic subsequent, passive range of motion, functional mobility training, visual/perceptual remediation/compensation, energy conservation, patient/family education, and DME and/or AE instructions  RECOMMENDED OTHER SERVICES: none noted  CONSULTED AND AGREED WITH PLAN OF CARE: Patient  PLAN FOR NEXT SESSION:  Continue with E-stim- L shoulder Coordination/ strengthening activities ROM activities WB activities  [employer], OT 02/17/2024, 1:58 PM

## 2024-02-17 NOTE — Patient Instructions (Signed)
  Coordination Activities  Perform the following activities for 10 minutes 2 times per day with left hand(s).   Flip cards 1 at a time as fast as you can. Deal cards with your thumb (Hold deck in hand and push card off top with thumb). Pick up coins, buttons, marbles, dried beans/pasta of different sizes and place in container. Pick up coins and stack. Screw together nuts and bolts, then unfasten.

## 2024-02-19 ENCOUNTER — Ambulatory Visit: Payer: Self-pay | Admitting: Urgent Care

## 2024-02-19 ENCOUNTER — Ambulatory Visit

## 2024-02-19 ENCOUNTER — Ambulatory Visit
Admission: EM | Admit: 2024-02-19 | Discharge: 2024-02-19 | Disposition: A | Discharge status: Home / Self Care | Attending: Family Medicine | Admitting: Family Medicine

## 2024-02-19 DIAGNOSIS — S92511A Displaced fracture of proximal phalanx of right lesser toe(s), initial encounter for closed fracture: Secondary | ICD-10-CM

## 2024-02-19 MED ORDER — OXYCODONE-ACETAMINOPHEN 5-325 MG PO TABS: 1.0000 | ORAL_TABLET | Freq: Four times a day (QID) | ORAL | 0 refills | Status: AC | PRN

## 2024-02-19 NOTE — ED Triage Notes (Signed)
 Pt reports pain in the right pinky toe after he bumped the toe with a door in his house. Pt reports the toe looks deformed.

## 2024-02-19 NOTE — Discharge Instructions (Addendum)
 Please schedule Tylenol  at 500 mg - 650 mg once every 6 hours as needed for aches and pains.  If you still have pain despite taking Tylenol  regularly, this is breakthrough pain.  You can use oxycodone  once every 6 hours for this.  Once your pain is better controlled, switch back to just Tylenol .  Wear the postop shoe is much as possible.  Follow-up with the podiatry practice as soon as possible.

## 2024-02-19 NOTE — ED Provider Notes (Signed)
 [name] - URGENT CARE CENTER  Note:  This document was prepared using Conservation officer, historic buildings and may include unintentional dictation errors.  MRN: [id number] DOB: 05/15/1975  Subjective:   Nathan Campbell is a 48 y.o. male presenting for 1 day history of right fifth toe pain.  Patient stubbed his toe against a door at his house.  Notes of bony deformity.  No open wounds, drainage or bleeding.  No current facility-administered medications for this encounter.  Current Outpatient Medications:    [Paused] amLODipine  (NORVASC ) 10 MG tablet, Place 1 tablet (10 mg total) into feeding tube daily., Disp: 30 tablet, Rfl: 0   aspirin  81 MG chewable tablet, Chew 1 tablet (81 mg total) by mouth daily., Disp: 30 tablet, Rfl: 0   atorvastatin  (LIPITOR) 40 MG tablet, Take 1 tablet (40 mg total) by mouth daily., Disp: 30 tablet, Rfl: 0   AURYXIA  1 GM 210 MG(Fe) tablet, Take 210 mg by mouth 3 (three) times daily with meals., Disp: , Rfl:    benzonatate  (TESSALON ) 100 MG capsule, Take 1 capsule (100 mg total) by mouth every 8 (eight) hours., Disp: 21 capsule, Rfl: 0   camphor-menthol  (SARNA) lotion, Apply topically as needed for itching., Disp: 222 mL, Rfl: 0   carvedilol  (COREG ) 6.25 MG tablet, Take 1 tablet (6.25 mg total) by mouth 2 (two) times daily., Disp: 60 tablet, Rfl: 0   clopidogrel  (PLAVIX ) 75 MG tablet, Take 1 tablet (75 mg total) by mouth daily., Disp: 30 tablet, Rfl: 0   cyclobenzaprine  (FLEXERIL ) 5 MG tablet, Take 1 tablet (5 mg total) by mouth 3 (three) times daily as needed for muscle spasms., Disp: 15 tablet, Rfl: 0   hydrOXYzine  (ATARAX ) 10 MG tablet, Take 1 tablet (10 mg total) by mouth 3 (three) times daily as needed for itching., Disp: 30 tablet, Rfl: 0   [Paused] insulin  aspart (NOVOLOG ) 100 UNIT/ML injection, Inject 0-6 Units into the skin every 4 (four) hours., Disp: 10 mL, Rfl: 11   [Paused] isosorbide  dinitrate (ISORDIL ) 10 MG tablet, Place 0.5 tablets (5 mg total)  into feeding tube 2 (two) times daily., Disp: 15 tablet, Rfl: 0   lanthanum  (FOSRENOL ) 750 MG chewable tablet, Chew 2 tablets (1,500 mg total) by mouth 3 (three) times daily with meals., Disp: 180 tablet, Rfl: 0   midodrine  (PROAMATINE ) 5 MG tablet, Take 1 tablet (5 mg total) by mouth 2 (two) times daily with breakfast and lunch., Disp: 60 tablet, Rfl: 0   ondansetron  (ZOFRAN ) 4 MG tablet, Take 1 tablet (4 mg total) by mouth every 6 (six) hours as needed for nausea or vomiting., Disp: 20 tablet, Rfl: 0   pantoprazole  (PROTONIX ) 40 MG tablet, Take 1 tablet (40 mg total) by mouth at bedtime., Disp: 30 tablet, Rfl: 0   senna-docusate (SENOKOT-S) 8.6-50 MG tablet, Take 2 tablets by mouth at bedtime., Disp: 30 tablet, Rfl: 0   witch hazel-glycerin  (TUCKS) pad, Apply topically as needed for hemorrhoids., Disp: 40 each, Rfl: 12   No Known Allergies  Past Medical History:  Diagnosis Date   Acute combined systolic and diastolic CHF, NYHA class 4 (HCC) 06/10/2016   Anemia    Cardiomyopathy (HCC) 06/14/2016   CHF (congestive heart failure) (HCC)    COVID 10/2020   Dyspnea    ESRD on hemodialysis (HCC)    TTS at [facility]   Hypertension    Hypertensive heart and kidney disease with heart failure (HCC) 06/14/2016   Hypertensive heart disease with congestive heart failure (HCC)  Obesity      Past Surgical History:  Procedure Laterality Date   A/V FISTULAGRAM Left 05/03/2022   Procedure: A/V Fistulagram;  Surgeon: Eliza Lonni RAMAN, MD;  Location: [facility] INVASIVE CV LAB;  Service: Cardiovascular;  Laterality: Left;   AV FISTULA PLACEMENT Left 11/11/2020   Procedure: LEFT ARM First Stage Basilic Vein Fistula Creation;  Surgeon: Sheree Penne Lonni, MD;  Location: [facility] OR;  Service: Vascular;  Laterality: Left;   BASCILIC VEIN TRANSPOSITION Left 01/23/2021   Procedure: Second Stage Basilic Vein Transposition of Left Arm Arteriovenous  Fistula;  Surgeon: Sheree Penne Lonni, MD;  Location:  [facility] OR;  Service: Vascular;  Laterality: Left;  Block  to LMA    COLONOSCOPY     FISTULA SUPERFICIALIZATION Left 05/07/2022   Procedure: PLICATION OF ANEURYSM, LEFT ARM FISTULA;  Surgeon: Eliza Lonni RAMAN, MD;  Location: [facility] OR;  Service: Vascular;  Laterality: Left;   IR CT HEAD LTD  10/07/2023   IR CT HEAD LTD  10/07/2023   IR PATIENT EVAL TECH 0-60 MINS  10/07/2023   IR PERCUTANEOUS ART THROMBECTOMY/INFUSION INTRACRANIAL INC DIAG ANGIO  10/07/2023   NO PAST SURGERIES     PERIPHERAL VASCULAR BALLOON ANGIOPLASTY Left 05/03/2022   Procedure: PERIPHERAL VASCULAR BALLOON ANGIOPLASTY;  Surgeon: Eliza Lonni RAMAN, MD;  Location: [facility] INVASIVE CV LAB;  Service: Cardiovascular;  Laterality: Left;  AVF   RADIOLOGY WITH ANESTHESIA N/A 10/07/2023   Procedure: RADIOLOGY WITH ANESTHESIA;  Surgeon: Radiologist, Medication, MD;  Location: [facility] OR;  Service: Radiology;  Laterality: N/A;    Family History  Problem Relation Age of Onset   Hypertension Mother    Diabetes Father    Hypertension Father    Hypertension Sister    Hypertension Paternal Grandmother    Diabetes Mellitus II Paternal Grandfather    Heart disease Cousin    Heart disease Cousin    Colon cancer Cousin    Esophageal cancer Neg Hx    Rectal cancer Neg Hx    Stomach cancer Neg Hx     Social History   Tobacco Use   Smoking status: Former    Current packs/day: 0.00    Types: Cigarettes    Quit date: 09/2015    Years since quitting: 8.4   Smokeless tobacco: Never   Tobacco comments:    occasionally  Vaping Use   Vaping status: Former  Substance Use Topics   Alcohol use: Not Currently   Drug use: Not Currently    ROS   Objective:   Vitals: BP 120/80 (BP Location: Right Arm)   Pulse (!) 104   Temp 99 F (37.2 C) (Oral)   Resp 18   SpO2 94%   Physical Exam Constitutional:      General: He is not in acute distress.    Appearance: Normal appearance. He is well-developed and normal weight. He is not  ill-appearing, toxic-appearing or diaphoretic.  HENT:     Head: Normocephalic and atraumatic.     Right Ear: External ear normal.     Left Ear: External ear normal.     Nose: Nose normal.     Mouth/Throat:     Pharynx: Oropharynx is clear.  Eyes:     General: No scleral icterus.       Right eye: No discharge.        Left eye: No discharge.     Extraocular Movements: Extraocular movements intact.  Cardiovascular:     Rate and Rhythm: Normal rate.  Pulmonary:  Effort: Pulmonary effort is normal.  Musculoskeletal:     Cervical back: Normal range of motion.       Feet:  Neurological:     Mental Status: He is alert and oriented to person, place, and time.  Psychiatric:        Mood and Affect: Mood normal.        Behavior: Behavior normal.        Thought Content: Thought content normal.        Judgment: Judgment normal.     DG Foot Complete Right Result Date: 02/19/2024 CLINICAL DATA:  Pain EXAM: RIGHT FOOT COMPLETE - 3+ VIEW COMPARISON:  06/18/2020 FINDINGS: Frontal, oblique, and lateral views of the right foot are obtained. There is an acute displaced fracture through the diaphysis of the fifth proximal phalanx, with valgus angulation at the fracture site. No other acute bony abnormalities. Multifocal osteoarthritis greatest within the midfoot. Prominent superior and inferior calcaneal spurs. IMPRESSION: 1. Acute displaced fifth proximal phalangeal fracture with valgus angulation at the fracture site. 2. Multifocal osteoarthritis. Electronically Signed   By: Ozell Daring M.D.   On: 02/19/2024 14:06   Patient placed into a postop shoe for the right foot.  Assessment and Plan :   I have reviewed the PDMP during this encounter.  1. Displaced fracture of proximal phalanx of right lesser toe(s), initial encounter for closed fracture    Wear postop shoe as above.  Use oxycodone  and Tylenol  for pain.  Follow-up with a podiatrist.  Counseled patient on potential for adverse effects  with medications prescribed/recommended today, ER and return-to-clinic precautions discussed, patient verbalized understanding.    Christopher Savannah, [place] 02/19/24 1454

## 2024-02-20 ENCOUNTER — Ambulatory Visit

## 2024-02-20 ENCOUNTER — Ambulatory Visit: Admitting: Physical Therapy

## 2024-02-20 DIAGNOSIS — I69854 Hemiplegia and hemiparesis following other cerebrovascular disease affecting left non-dominant side: Secondary | ICD-10-CM | POA: Diagnosis not present

## 2024-02-20 DIAGNOSIS — M6281 Muscle weakness (generalized): Secondary | ICD-10-CM

## 2024-02-20 DIAGNOSIS — R208 Other disturbances of skin sensation: Secondary | ICD-10-CM

## 2024-02-20 DIAGNOSIS — R278 Other lack of coordination: Secondary | ICD-10-CM

## 2024-02-20 NOTE — Therapy (Signed)
 OUTPATIENT OCCUPATIONAL THERAPY NEURO TREATMENT   Patient Name: Nathan Campbell MRN: [id number] DOB:07-Aug-1975, 48 y.o., male Today's Date: 02/20/2024  PCP: Benjamine Aland, MD REFERRING PROVIDER: Sebastian Jerilyn HERO, FNP  END OF SESSION:  OT End of Session - 02/20/24 1517     Visit Number 13    Number of Visits 22    Date for Recertification  05/11/24    Authorization Type [employer] [facility]    Authorization Time Period 12/30/23-02/24/24    OT Start Time 1519    OT Stop Time 1557    OT Time Calculation (min) 38 min    Equipment Utilized During Treatment fluidotherapy    Activity Tolerance Patient tolerated treatment well    Behavior During Therapy [facility] for tasks assessed/performed            Past Medical History:  Diagnosis Date   Acute combined systolic and diastolic CHF, NYHA class 4 (HCC) 06/10/2016   Anemia    Cardiomyopathy (HCC) 06/14/2016   CHF (congestive heart failure) (HCC)    COVID 10/2020   Dyspnea    ESRD on hemodialysis (HCC)    TTS at [facility]   Hypertension    Hypertensive heart and kidney disease with heart failure (HCC) 06/14/2016   Hypertensive heart disease with congestive heart failure (HCC)    Obesity    Past Surgical History:  Procedure Laterality Date   A/V FISTULAGRAM Left 05/03/2022   Procedure: A/V Fistulagram;  Surgeon: Eliza Lonni RAMAN, MD;  Location: [facility] INVASIVE CV LAB;  Service: Cardiovascular;  Laterality: Left;   AV FISTULA PLACEMENT Left 11/11/2020   Procedure: LEFT ARM First Stage Basilic Vein Fistula Creation;  Surgeon: Sheree Penne Lonni, MD;  Location: [facility] OR;  Service: Vascular;  Laterality: Left;   BASCILIC VEIN TRANSPOSITION Left 01/23/2021   Procedure: Second Stage Basilic Vein Transposition of Left Arm Arteriovenous  Fistula;  Surgeon: Sheree Penne Lonni, MD;  Location: [facility] OR;  Service: Vascular;  Laterality: Left;  Block  to LMA    COLONOSCOPY     FISTULA SUPERFICIALIZATION Left 05/07/2022   Procedure: PLICATION  OF ANEURYSM, LEFT ARM FISTULA;  Surgeon: Eliza Lonni RAMAN, MD;  Location: [facility] OR;  Service: Vascular;  Laterality: Left;   IR CT HEAD LTD  10/07/2023   IR CT HEAD LTD  10/07/2023   IR PATIENT EVAL TECH 0-60 MINS  10/07/2023   IR PERCUTANEOUS ART THROMBECTOMY/INFUSION INTRACRANIAL INC DIAG ANGIO  10/07/2023   NO PAST SURGERIES     PERIPHERAL VASCULAR BALLOON ANGIOPLASTY Left 05/03/2022   Procedure: PERIPHERAL VASCULAR BALLOON ANGIOPLASTY;  Surgeon: Eliza Lonni RAMAN, MD;  Location: [facility] INVASIVE CV LAB;  Service: Cardiovascular;  Laterality: Left;  AVF   RADIOLOGY WITH ANESTHESIA N/A 10/07/2023   Procedure: RADIOLOGY WITH ANESTHESIA;  Surgeon: Radiologist, Medication, MD;  Location: [facility] OR;  Service: Radiology;  Laterality: N/A;   Patient Active Problem List   Diagnosis Date Noted   Stroke (HCC) 11/04/2023   Cognitive and neurobehavioral dysfunction 10/24/2023   Acute hypoxic respiratory failure (HCC) 10/17/2023   Essential hypertension 10/17/2023   Dysphagia 10/17/2023   Leukocytosis 10/17/2023   Obesity 10/17/2023   Acute ischemic stroke (HCC) 10/07/2023   Middle cerebral artery embolism, right 10/07/2023   ESRD (end stage renal disease) (HCC) 03/26/2022   Hyperlipidemia, unspecified 03/20/2021   Acute gout due to renal impairment involving left wrist 04/24/2019   Malignant hypertension 10/14/2016   Combined congestive systolic and diastolic heart failure (HCC) 06/14/2016   NICM (nonischemic cardiomyopathy) (HCC) 06/14/2016  Renal failure    Hypertensive urgency 06/10/2016   CKD (chronic kidney disease), stage IV (HCC) 06/10/2016   Normocytic anemia 06/10/2016    ONSET DATE: 12/19/2023 referral date Admit date: 10/18/2023 Discharge date: 11/04/2023  REFERRING DIAG: G81.90 (ICD-10-CM) - Hemiplegia, unspecified affecting unspecified side  THERAPY DIAG:  Hemiplegia and hemiparesis following other cerebrovascular disease affecting left non-dominant side (HCC)  Muscle weakness  (generalized)  Other lack of coordination  Other disturbances of skin sensation  Rationale for Evaluation and Treatment: Rehabilitation  SUBJECTIVE:   SUBJECTIVE STATEMENT: Pt reports he broke his toe while getting ready for church over the weekend. Denied any pain.  Pt accompanied by: self  PERTINENT HISTORY: Pt is a 48 yo male presenting to this clinic with hemiplegia s/p R MCA embolism. Pt was hospitalized for this from 10/07/23-10/18/23, then discharged to neurology and received IP PT/OT/SLP 10/18/23-11/04/23. Initial CT head revealed right MCA territory ischemic changes with calcific density in proximal right M1 concerning for calcific embolus.   CHF, CM, ESRD on HD TTS, HTN, obesity, HLD  PRECAUTIONS: Fall, L sided weakness, blood thinners  WEIGHT BEARING RESTRICTIONS: No  PAIN:  Are you having pain? No  FALLS: Has patient fallen in last 6 months? No  LIVING ENVIRONMENT: Lives with: lives with their family sister and her 2 sons. Pt reports will be getting own place soon. Lives in: House/apartment townhouse, 2 level Stairs: Yes: Internal: 3 steps; on right going up Has following equipment at home: None  PLOF: Independent and Vocation/Vocational requirements: Worked in distribution in [employer]   PATIENT GOALS: Get back movement in my left arm, return to work, and be able to play video games like I was before.  OBJECTIVE:  Note: Objective measures were completed at Evaluation unless otherwise noted.  HAND DOMINANCE: Right  ADLs: Overall ADLs: Independent Transfers/ambulation related to ADLs: Independent Eating: Independent Grooming: Independent UB Dressing: Independent LB Dressing: Independent Toileting: Independent Bathing: Independent Tub Shower transfers: Independent, shower/tub combo Equipment: none  IADLs: Shopping: Independent Light housekeeping: Mom is doing pharmacologist.  Meal Prep: Needs some help with opening cans and jars d/t weakness in L  hand Community mobility: Mother driving pt to appts  Medication management: Mother helping with medication mgmt Financial management: Independent Handwriting: did not assess d/t pt report of no changes and pt dominant hand unaffected.   MOBILITY STATUS: Independent  POSTURE COMMENTS:  No Significant postural limitations Sitting balance: WFL  ACTIVITY TOLERANCE: Activity tolerance: No changes  FUNCTIONAL OUTCOME MEASURES: Quick Dash: 47.7/100: 47.7% impairment 01/30/24: 52.3% impairment  02/17/24:  UPPER EXTREMITY ROM:  R WNL  Active ROM Right eval Left eval  Shoulder flexion  92  Shoulder abduction  70  Wrist flexion  10  Wrist extension  40  Wrist pronation    Wrist supination  Impaired (re-assess)  (Blank rows = not tested)  UPPER EXTREMITY MMT:   WFL RUE, 3-/5 grossly for LUE  HAND FUNCTION: Grip strength: Right: 61.2 lbs; Left: 15.8 lbs 01/20/24: Left: 19.4 lbs  COORDINATION: 9 Hole Peg test: Right: 41.60 sec; Left: unable to pick up a peg in 2 minutes sec Box and Blocks:  Right 36 blocks, Left 11blocks 01/20/24: Left: 15 blocks   SENSATION: WFL  EDEMA: none  MUSCLE TONE: LUE: Rigidity  COGNITION: Overall cognitive status: Within functional limits for tasks assessed  VISION: Subjective report: no concerns Baseline vision: No visual deficits Visual history: no hx  VISION ASSESSMENT: WFL To be further assessed in functional context  Patient  has difficulty with following activities due to following visual impairments: none  PERCEPTION: WFL  PRAXIS: Impaired: Ideomotor  OBSERVATIONS: Weakness in L hand and limited ROM in LUE affecting ability to complete ADL/IADL.                                                                                                                            TREATMENT: 02/20/24 Therapeutic exercise completed for duration below including the following:  Pt education in use and benefits of fluidotherapy for improving  functional use of B hands, but especially L hand with less stiffness. Pt placed BUE in Fluidotherapy machine with supervised ROM x 10 min. Pt was educated to complete B AROM during modality time to improve ROM and decrease pain/stiffness of affected extremity by use of the machine's massaging action and thermal properties.   Therapeutic activities completed for duration below including the following:  Pt reported not completing coordination HEP over weekend. OT stressed importance of adhering to HEPs to promote max potential being reached and improved functional use of affected LUE in daily tasks. Patient used L hand to assemble small Jenga tower, and then removed approximately 10 pieces individually until the tower fell over for fine motor coordination of affected extremity. Modification of activity required by OT instructing to remove specific blocks for improved success. Pt then instructed to put blocks away one by one at end of second game to target L FM coordination further. Finally picked up quarters one by one with L hand and inserted into slot with mod cues for sequencing.  PATIENT EDUCATION: Education details: SEE ABOVE Person educated: Patient Education method: Explanation, Demonstration, Tactile cues, Verbal cues, and Handouts Education comprehension: verbalized understanding, returned demonstration, verbal cues required, and needs further education  HOME EXERCISE PROGRAM: 12/30/23: Shoulder, Forearm/wrist, and hand HEP  01/02/24: Theraputty HEP with red putty (ACCESS CODE [id number]) 01/11/24: Shoulder shrug exercises (same access code as above) 01/16/24: E-stim and saebo glove [employer] 01/20/24: Nonie opener [employer] 02/17/24: Coordination activities (see Pt instructions)   GOALS: Goals reviewed with patient? Yes   NEW SHORT TERM GOALS: Target date: 03/23/24  1. Pt will be able to place at least 20 blocks using left hand with completion of Box and Blocks test.    Baseline at initial eval: 15 blocks 01/30/24: 8 blocks 02/17/24: 14 blocks  NEW LONG TERM GOALS: Target date: 05/11/24 1. Patient will demonstrate at least 40 lbs L grip strength as needed to open jars and other containers.   Baseline at initial eval: 19.2  01/30/24: 18.27 lbs  02/17/24: 25.5 lbs average   ASSESSMENT:  CLINICAL IMPRESSION: Patient is a 48 y.o. male who was seen today for occupational therapy tx for L sided weakness s/p stroke. Pt receptive to education and use of fluidotherapy. Pt reminded of importance of completion of coordination HEP to improve functional use of LUE, as pt reported not completing over weekend. Pt would benefit from skilled OT services in the  outpatient setting to work on impairments as noted below to help pt return to PLOF as able.     PERFORMANCE DEFICITS: in functional skills including ADLs, IADLs, coordination, dexterity, tone, ROM, strength, pain, Fine motor control, body mechanics, cardiopulmonary status limiting function, and UE functional use, cognitive skills including attention, safety awareness, and sequencing, and psychosocial skills including coping strategies, interpersonal interactions, and routines and behaviors.   IMPAIRMENTS: are limiting patient from IADLs, rest and sleep, work, and leisure.   CO-MORBIDITIES: has co-morbidities such as ESRD that affects occupational performance. Patient will benefit from skilled OT to address above impairments and improve overall function.  REHAB POTENTIAL: Good   PLAN:  OT FREQUENCY: 1x/week  OT DURATION: additional 10 weeks  PLANNED INTERVENTIONS: 97168 OT Re-evaluation, 97535 self care/ADL training, 02889 therapeutic exercise, 97530 therapeutic activity, 97112 neuromuscular re-education, 97140 manual therapy, 97039 fluidotherapy, 97032 electrical stimulation (manual), 97760 Orthotic Initial, [id number] Orthotic/Prosthetic subsequent, passive range of motion, functional mobility training,  visual/perceptual remediation/compensation, energy conservation, patient/family education, and DME and/or AE instructions  RECOMMENDED OTHER SERVICES: none noted  CONSULTED AND AGREED WITH PLAN OF CARE: Patient  PLAN FOR NEXT SESSION:  Coordination/ strengthening activities ROM activities WB activities  Bland, [place] 02/20/2024, 3:28 PM

## 2024-02-22 ENCOUNTER — Ambulatory Visit: Admitting: Podiatry

## 2024-02-22 ENCOUNTER — Encounter: Payer: Self-pay | Admitting: Podiatry

## 2024-02-22 DIAGNOSIS — S92501A Displaced unspecified fracture of right lesser toe(s), initial encounter for closed fracture: Secondary | ICD-10-CM | POA: Diagnosis not present

## 2024-02-22 NOTE — Progress Notes (Signed)
 Chief Complaint  Patient presents with   Toe Injury    Pt is here due to recent right fifth proximal phalanx toe fracture, states that he injured the foot this pass weekend, tripped over something, states he has no pain at the moment, he is wearing a post op shoe.    HPI: 48 y.o. male presenting today as a new patient for outpatient follow-up after sustaining a toe fracture to the right foot.  DOI: 02/19/2024.  Went to the emergency department and diagnosed with fracture.  Presenting today wearing a postop shoe.  He says that it is no longeR painful  Past Medical History:  Diagnosis Date   Acute combined systolic and diastolic CHF, NYHA class 4 (HCC) 06/10/2016   Anemia    Cardiomyopathy (HCC) 06/14/2016   CHF (congestive heart failure) (HCC)    COVID 10/2020   Dyspnea    ESRD on hemodialysis (HCC)    TTS at [facility]   Hypertension    Hypertensive heart and kidney disease with heart failure (HCC) 06/14/2016   Hypertensive heart disease with congestive heart failure (HCC)    Obesity     Past Surgical History:  Procedure Laterality Date   A/V FISTULAGRAM Left 05/03/2022   Procedure: A/V Fistulagram;  Surgeon: Eliza Lonni RAMAN, MD;  Location: [facility] INVASIVE CV LAB;  Service: Cardiovascular;  Laterality: Left;   AV FISTULA PLACEMENT Left 11/11/2020   Procedure: LEFT ARM First Stage Basilic Vein Fistula Creation;  Surgeon: Sheree Penne Lonni, MD;  Location: [facility] OR;  Service: Vascular;  Laterality: Left;   BASCILIC VEIN TRANSPOSITION Left 01/23/2021   Procedure: Second Stage Basilic Vein Transposition of Left Arm Arteriovenous  Fistula;  Surgeon: Sheree Penne Lonni, MD;  Location: [facility] OR;  Service: Vascular;  Laterality: Left;  Block  to LMA    COLONOSCOPY     FISTULA SUPERFICIALIZATION Left 05/07/2022   Procedure: PLICATION OF ANEURYSM, LEFT ARM FISTULA;  Surgeon: Eliza Lonni RAMAN, MD;  Location: [facility] OR;  Service: Vascular;  Laterality: Left;   IR CT HEAD LTD   10/07/2023   IR CT HEAD LTD  10/07/2023   IR PATIENT EVAL TECH 0-60 MINS  10/07/2023   IR PERCUTANEOUS ART THROMBECTOMY/INFUSION INTRACRANIAL INC DIAG ANGIO  10/07/2023   NO PAST SURGERIES     PERIPHERAL VASCULAR BALLOON ANGIOPLASTY Left 05/03/2022   Procedure: PERIPHERAL VASCULAR BALLOON ANGIOPLASTY;  Surgeon: Eliza Lonni RAMAN, MD;  Location: [facility] INVASIVE CV LAB;  Service: Cardiovascular;  Laterality: Left;  AVF   RADIOLOGY WITH ANESTHESIA N/A 10/07/2023   Procedure: RADIOLOGY WITH ANESTHESIA;  Surgeon: Radiologist, Medication, MD;  Location: [facility] OR;  Service: Radiology;  Laterality: N/A;    No Known Allergies   Physical Exam: General: The patient is alert and oriented x3 in no acute distress.  Dermatology: Skin is warm, dry and supple bilateral lower extremities.   Vascular: Palpable pedal pulses bilaterally. Capillary refill within normal limits.  No appreciable edema.  No erythema.  Neurological: Grossly intact via light touch  Musculoskeletal Exam: Overall gross alignment of the toes today  DG Foot Complete Right (Accession [id number]) (Order [id number]) Imaging Date: 02/19/2024 Department: [facility]) Released By: Rowland Roxianne MATSU, CMA (auto-released) Authorizing: Christopher Savannah, PA-C  IMPRESSION: 1. Acute displaced fifth proximal phalangeal fracture with valgus angulation at the fracture site. 2. Multifocal osteoarthritis.  Assessment/Plan of Care: 1.  Fracture proximal phalanx fifth digit right foot; closed, displaced, initial encounter  -Patient evaluated.  X-rays reviewed -  Clinically the toes appear to be in good overall gross alignment.  I do believe the toe should heal uneventfully -Compression ankle sleeve dispensed and applied to the foot to compress the fifth digit to the adjacent toe -Continue WBAT postop shoe for an additional 4 weeks.  After this he can transition out of the postop shoe into good supportive tennis shoes  and sneakers -Return to clinic PRN       Thresa EMERSON Sar, DPM [facility]  Dr. Thresa EMERSON Sar, DPM    [address] [place], [place] [address]                Office [phone]  Fax [phone]

## 2024-02-27 ENCOUNTER — Ambulatory Visit: Admitting: Occupational Therapy

## 2024-02-27 DIAGNOSIS — M6281 Muscle weakness (generalized): Secondary | ICD-10-CM

## 2024-02-27 DIAGNOSIS — I69854 Hemiplegia and hemiparesis following other cerebrovascular disease affecting left non-dominant side: Secondary | ICD-10-CM

## 2024-02-27 DIAGNOSIS — R278 Other lack of coordination: Secondary | ICD-10-CM

## 2024-02-27 DIAGNOSIS — R29818 Other symptoms and signs involving the nervous system: Secondary | ICD-10-CM

## 2024-02-27 NOTE — Patient Instructions (Signed)
 Access Code: [facility] URL: [email or web address] Date: 02/27/2024 Prepared by: Clarita Pride  Exercises - Forearm Pronation and Supination with Hammer  - 1 x daily - 10 reps - Standing Forearm Pronation and Supination with Hammer  - 1 x daily - 10 reps - Standing Wrist Radial Deviation with Hammer  - 1 x daily - 10 reps - Standing Wrist Ulnar Deviation with Hammer  - 1 x daily - 10 reps

## 2024-02-27 NOTE — Therapy (Signed)
 OUTPATIENT OCCUPATIONAL THERAPY NEURO TREATMENT   Patient Name: Nathan Campbell MRN: [id number] DOB:November 01, 1975, 48 y.o., male Today's Date: 02/27/2024  PCP: Benjamine Aland, MD REFERRING PROVIDER: Sebastian Jerilyn HERO, FNP  END OF SESSION:  OT End of Session - 02/27/24 0851     Visit Number 14    Number of Visits 22    Date for Recertification  05/11/24    Authorization Type [employer] [facility]    Authorization Time Period 12/30/23-02/24/24    OT Start Time 0850    OT Stop Time 0930    OT Time Calculation (min) 40 min    Activity Tolerance Patient tolerated treatment well    Behavior During Therapy [facility] for tasks assessed/performed            Past Medical History:  Diagnosis Date   Acute combined systolic and diastolic CHF, NYHA class 4 (HCC) 06/10/2016   Anemia    Cardiomyopathy (HCC) 06/14/2016   CHF (congestive heart failure) (HCC)    COVID 10/2020   Dyspnea    ESRD on hemodialysis (HCC)    TTS at [facility]   Hypertension    Hypertensive heart and kidney disease with heart failure (HCC) 06/14/2016   Hypertensive heart disease with congestive heart failure (HCC)    Obesity    Past Surgical History:  Procedure Laterality Date   A/V FISTULAGRAM Left 05/03/2022   Procedure: A/V Fistulagram;  Surgeon: Eliza Lonni RAMAN, MD;  Location: [facility] INVASIVE CV LAB;  Service: Cardiovascular;  Laterality: Left;   AV FISTULA PLACEMENT Left 11/11/2020   Procedure: LEFT ARM First Stage Basilic Vein Fistula Creation;  Surgeon: Sheree Penne Lonni, MD;  Location: [facility] OR;  Service: Vascular;  Laterality: Left;   BASCILIC VEIN TRANSPOSITION Left 01/23/2021   Procedure: Second Stage Basilic Vein Transposition of Left Arm Arteriovenous  Fistula;  Surgeon: Sheree Penne Lonni, MD;  Location: [facility] OR;  Service: Vascular;  Laterality: Left;  Block  to LMA    COLONOSCOPY     FISTULA SUPERFICIALIZATION Left 05/07/2022   Procedure: PLICATION OF ANEURYSM, LEFT ARM FISTULA;  Surgeon: Eliza Lonni RAMAN, MD;  Location: [facility] OR;  Service: Vascular;  Laterality: Left;   IR CT HEAD LTD  10/07/2023   IR CT HEAD LTD  10/07/2023   IR PATIENT EVAL TECH 0-60 MINS  10/07/2023   IR PERCUTANEOUS ART THROMBECTOMY/INFUSION INTRACRANIAL INC DIAG ANGIO  10/07/2023   NO PAST SURGERIES     PERIPHERAL VASCULAR BALLOON ANGIOPLASTY Left 05/03/2022   Procedure: PERIPHERAL VASCULAR BALLOON ANGIOPLASTY;  Surgeon: Eliza Lonni RAMAN, MD;  Location: [facility] INVASIVE CV LAB;  Service: Cardiovascular;  Laterality: Left;  AVF   RADIOLOGY WITH ANESTHESIA N/A 10/07/2023   Procedure: RADIOLOGY WITH ANESTHESIA;  Surgeon: Radiologist, Medication, MD;  Location: [facility] OR;  Service: Radiology;  Laterality: N/A;   Patient Active Problem List   Diagnosis Date Noted   Stroke (HCC) 11/04/2023   Cognitive and neurobehavioral dysfunction 10/24/2023   Acute hypoxic respiratory failure (HCC) 10/17/2023   Essential hypertension 10/17/2023   Dysphagia 10/17/2023   Leukocytosis 10/17/2023   Obesity 10/17/2023   Acute ischemic stroke (HCC) 10/07/2023   Middle cerebral artery embolism, right 10/07/2023   ESRD (end stage renal disease) (HCC) 03/26/2022   Hyperlipidemia, unspecified 03/20/2021   Acute gout due to renal impairment involving left wrist 04/24/2019   Malignant hypertension 10/14/2016   Combined congestive systolic and diastolic heart failure (HCC) 06/14/2016   NICM (nonischemic cardiomyopathy) (HCC) 06/14/2016   Renal failure    Hypertensive  urgency 06/10/2016   CKD (chronic kidney disease), stage IV (HCC) 06/10/2016   Normocytic anemia 06/10/2016    ONSET DATE: 12/19/2023 referral date Admit date: 10/18/2023 Discharge date: 11/04/2023  REFERRING DIAG: G81.90 (ICD-10-CM) - Hemiplegia, unspecified affecting unspecified side  THERAPY DIAG:  No diagnosis found.  Rationale for Evaluation and Treatment: Rehabilitation  SUBJECTIVE:   SUBJECTIVE STATEMENT: Pt reports he broke his toe doesn't hurt as bad, after  holidays should be able to put on shoe  New apartment 03/06/24 [place]   Pt accompanied by: self  PERTINENT HISTORY: Pt is a 48 yo male presenting to this clinic with hemiplegia s/p R MCA embolism. Pt was hospitalized for this from 10/07/23-10/18/23, then discharged to neurology and received IP PT/OT/SLP 10/18/23-11/04/23. Initial CT head revealed right MCA territory ischemic changes with calcific density in proximal right M1 concerning for calcific embolus.   CHF, CM, ESRD on HD TTS, HTN, obesity, HLD  PRECAUTIONS: Fall, L sided weakness, blood thinners  WEIGHT BEARING RESTRICTIONS: No  PAIN:  Are you having pain? No  FALLS: Has patient fallen in last 6 months? No  LIVING ENVIRONMENT: Lives with: lives with their family sister and her 2 sons. Pt reports will be getting own place soon. Lives in: House/apartment townhouse, 2 level Stairs: Yes: Internal: 3 steps; on right going up Has following equipment at home: None  PLOF: Independent and Vocation/Vocational requirements: Worked in distribution in [employer]   PATIENT GOALS: Get back movement in my left arm, return to work, and be able to play video games like I was before.  OBJECTIVE:  Note: Objective measures were completed at Evaluation unless otherwise noted.  HAND DOMINANCE: Right  ADLs: Overall ADLs: Independent Transfers/ambulation related to ADLs: Independent Eating: Independent Grooming: Independent UB Dressing: Independent LB Dressing: Independent Toileting: Independent Bathing: Independent Tub Shower transfers: Independent, shower/tub combo Equipment: none  IADLs: Shopping: Independent Light housekeeping: Mom is doing pharmacologist.  Meal Prep: Needs some help with opening cans and jars d/t weakness in L hand Community mobility: Mother driving pt to appts  Medication management: Mother helping with medication mgmt Financial management: Independent Handwriting: did not assess d/t pt report of no changes  and pt dominant hand unaffected.   MOBILITY STATUS: Independent  POSTURE COMMENTS:  No Significant postural limitations Sitting balance: WFL  ACTIVITY TOLERANCE: Activity tolerance: No changes  FUNCTIONAL OUTCOME MEASURES: Quick Dash: 47.7/100: 47.7% impairment 01/30/24: 52.3% impairment 02/2024 20.5  02/17/24:  UPPER EXTREMITY ROM:  R WNL  Active ROM Right eval Left eval  Shoulder flexion  92  Shoulder abduction  70  Wrist flexion  10  Wrist extension  40  Wrist pronation    Wrist supination  Impaired (re-assess)  (Blank rows = not tested)  UPPER EXTREMITY MMT:   WFL RUE, 3-/5 grossly for LUE  HAND FUNCTION: Grip strength: Right: 61.2 lbs; Left: 15.8 lbs 01/20/24: Left: 19.4 lbs  Grip   COORDINATION: 9 Hole Peg test: Right: 41.60 sec; Left: unable to pick up a peg in 2 minutes sec Box and Blocks:  Right 36 blocks, Left 11blocks 01/20/24: Left: 15 blocks   SENSATION: WFL  EDEMA: none  MUSCLE TONE: LUE: Rigidity  COGNITION: Overall cognitive status: Within functional limits for tasks assessed  VISION: Subjective report: no concerns Baseline vision: No visual deficits Visual history: no hx  VISION ASSESSMENT: WFL To be further assessed in functional context  Patient has difficulty with following activities due to following visual impairments: none  PERCEPTION: WFL  PRAXIS:  Impaired: Ideomotor  OBSERVATIONS: Weakness in L hand and limited ROM in LUE affecting ability to complete ADL/IADL.                                                                                                                            TREATMENT: 02/27/24  Therapeutic exercise and activities completed for duration below including the following:  Pt engaged in use of the wrist Jux-a-cisor to address wrist ROM, grip strength, motor planning, and sustained grasp of the affected L UE. Pt utilized bilateral hands with frequent verbal and tactile cues to maintain and adjust grasp  at the base of the device throughout the task to improve more controlled distal control of LUE. Pt completed x5 repetitions; however, was unable to perform the activity above tabletop height with the L hand and required positioning the device below table level for successful completion. Motor planning deficits were noted, as pt frequently returned to the starting position unintentionally, even when performing with the unaffected dominant R UE, requiring redirection and cueing.   Pt participated in resistance training using a beige FlexBar to improve strength, endurance, and isolated movement control of the affected L UE. Exercises completed included: Wrist flexion/extension via forward and backward twisting of the bar Forearm pronation/supination by bending the bar with palms alternately facing up and down Radial/ulnar deviation by pressing the bar back and forth on the tabletop Pt was able to complete all motions with intermittent hand-over-hand assistance to improve motor planning, isolation of the L UE, and to prevent overuse of the unaffected R UE.  Pt also performed hammer-based exercises to further address forearm rotation and wrist control. Education and guidance were provided on proper hand placement along the hammer handle to grade resistance and improve control. Hand-over-hand assistance was provided as needed to facilitate isolated wrist motions, particularly radial and ulnar deviation.  Exercises included: Forearm pronation/supination - 10 reps Wrist radial deviation - 10 reps Wrist ulnar deviation - 10 reps  Pt tolerated exercises well with frequent cueing for technique, controlled movement, and isolation of wrist motion, demonstrating gradual improvement in motor control with repetition.  See pt instructions for suggestion for carryover at home  PATIENT EDUCATION: Education details: Hammer exercises Person educated: Patient Education method: Explanation, Demonstration, Tactile  cues, Verbal cues, and Handouts Education comprehension: verbalized understanding, returned demonstration, verbal cues required, and needs further education  HOME EXERCISE PROGRAM: 12/30/23: Shoulder, Forearm/wrist, and hand HEP  01/02/24: Theraputty HEP with red putty (ACCESS CODE [id number]) 01/11/24: Shoulder shrug exercises (same access code as above) 01/16/24: E-stim and saebo glove [employer] 01/20/24: Nonie opener [employer] 02/17/24: Coordination activities (see Pt instructions) 02/27/24: Leonel exercises (same acces code)    GOALS: Goals reviewed with patient? Yes   NEW SHORT TERM GOALS: Target date: 03/23/24  1. Pt will be able to place at least 20 blocks using left hand with completion of Box and Blocks test.   Baseline at initial eval: 15 blocks 01/30/24:  8 blocks 02/17/24: 14 blocks  NEW LONG TERM GOALS: Target date: 05/11/24 1. Patient will demonstrate at least 40 lbs L grip strength as needed to open jars and other containers.   Baseline at initial eval: 19.2  01/30/24: 18.27 lbs  02/17/24: 25.5 lbs average   ASSESSMENT:  CLINICAL IMPRESSION: Patient is a 48 y.o. male who was seen today for occupational therapy tx for L sided weakness s/p stroke. Pt receptive to ideas for UE strengthening with hammer today. Pt would benefit from skilled OT services in the outpatient setting to work on impairments as noted below to help pt return to PLOF as able.     PERFORMANCE DEFICITS: in functional skills including ADLs, IADLs, coordination, dexterity, tone, ROM, strength, pain, Fine motor control, body mechanics, cardiopulmonary status limiting function, and UE functional use, cognitive skills including attention, safety awareness, and sequencing, and psychosocial skills including coping strategies, interpersonal interactions, and routines and behaviors.   IMPAIRMENTS: are limiting patient from IADLs, rest and sleep, work, and leisure.   CO-MORBIDITIES: has  co-morbidities such as ESRD that affects occupational performance. Patient will benefit from skilled OT to address above impairments and improve overall function.  REHAB POTENTIAL: Good   PLAN:  OT FREQUENCY: 1x/week  OT DURATION: additional 10 weeks  PLANNED INTERVENTIONS: 97168 OT Re-evaluation, 97535 self care/ADL training, 02889 therapeutic exercise, 97530 therapeutic activity, 97112 neuromuscular re-education, 97140 manual therapy, 97039 fluidotherapy, 97032 electrical stimulation (manual), 97760 Orthotic Initial, [id number] Orthotic/Prosthetic subsequent, passive range of motion, functional mobility training, visual/perceptual remediation/compensation, energy conservation, patient/family education, and DME and/or AE instructions  RECOMMENDED OTHER SERVICES: none noted  CONSULTED AND AGREED WITH PLAN OF CARE: Patient  PLAN FOR NEXT SESSION:  Coordination/ strengthening activities ROM activities WB activities  Clarita LITTIE Pride, OT 02/27/2024, 8:52 AM

## 2024-02-29 ENCOUNTER — Ambulatory Visit: Admitting: Neurology

## 2024-02-29 ENCOUNTER — Encounter: Payer: Self-pay | Admitting: Neurology

## 2024-02-29 VITALS — BP 135/83 | HR 83 | Ht 73.0 in | Wt 247.0 lb

## 2024-02-29 DIAGNOSIS — R4781 Slurred speech: Secondary | ICD-10-CM

## 2024-02-29 DIAGNOSIS — I63511 Cerebral infarction due to unspecified occlusion or stenosis of right middle cerebral artery: Secondary | ICD-10-CM | POA: Diagnosis not present

## 2024-02-29 NOTE — Progress Notes (Signed)
 [facility]  PATIENT: Nathan Campbell DOB: March 07, 1976  REQUESTING CLINICIAN: Waddell Karna LABOR, NP HISTORY FROM: Patient  REASON FOR VISIT: Stroke follow up    HISTORICAL  CHIEF COMPLAINT:  Chief Complaint  Patient presents with   Follow-up    Room 12 Alone surgical clearance needed    HISTORY OF PRESENT ILLNESS:  This is a 48 year old gentleman with medical conditions including hypertension, hyperlipidemia, obesity, end-stage renal disease on dialysis, heart failure who is presenting after being admitted to the hospital for left-sided weakness and found to have a right hemispheric stroke.  He was status post TNK and IR with TICI 2b revascularization.  He stroke etiology likely large vessel disease.  He has completed aspirin  and Plavix  for 3 months but still taking Plavix  with aspirin .  He is doing well from a stroke perspective, he still have slurred speech and difficulty with his left hand dexterity.  He is currently undergoing occupational therapy and set up to start speech therapy next week. Patient also tells me that he is set up for radical nephrectomy scheduled for next month and needs surgical clearance.      BRIEF HOSPITAL COURSE AND SUMMARY 48 y.o. patient with history of obesity, hypertension, end-stage renal disease on HD TTS, combined systolic and diastolic heart failure who presented to the emergency department on 7/25 with left-sided weakness, slurred speech. NIH on Admission 13. IV TNK after CT head showed what looked like a calcific embolus in the right MCA and a good ASPECT score and CT angiogram could not be obtained with emergent cerebral catheter angiogram showed right M1 occlusion and he underwent TICI 2 b revascularization of the superior and inferior division to the M2 region but was left intubated for medical issues given history of CHF and renal failure.    Stroke:  right MCA large infarct with right M1 occlusion s/p TNK and IR with  TICI2b, etiology: Likely large vessel disease given calcified plaque Code Stroke CT head - Findings consistent with acute right MCA territory ischemic changes, ASPECT score 7. And calcific density is noted in the proximal right M1 segment. This is most concerning for a calcific embolus. Intracranial atherosclerotic changes considered less likely. S/p IR with right M1 occlusion achieved TICI2b reperfusion MRI   Large right MCA territory infarct with associated diffuse cortical thickening and some mass effect MRA  Distal right M2 occlusion, somewhat more proximal than on the final angio images. No new occlusion is present. Repeat CT Head-  Expected evolution of the large right MCA territory infarct with effacement of the sulci and hyperdense thrombus within sylvian branches of the right MCA. No significant midline shift. 2D Echo EF 50-55% UDS negative No antithrombotic prior to admission, now on aspirin  81 and Plavix  75 DAPT for 3 months and then ASA alone. Therapy recommendations: CIR    OTHER MEDICAL CONDITIONS: Hypertension, Hyperlipidemia, obesity, end-stage renal disease on dialysis, heart failure   REVIEW OF SYSTEMS: Full 14 system review of systems performed and negative with exception of: As noted in the HPI   ALLERGIES: Allergies[1]  HOME MEDICATIONS: Outpatient Medications Prior to Visit  Medication Sig Dispense Refill   aspirin  81 MG chewable tablet Chew 1 tablet (81 mg total) by mouth daily. 30 tablet 0   atorvastatin  (LIPITOR) 40 MG tablet Take 1 tablet (40 mg total) by mouth daily. 30 tablet 0   AURYXIA  1 GM 210 MG(Fe) tablet Take 210 mg by mouth 3 (three) times daily with meals.  benzonatate  (TESSALON ) 100 MG capsule Take 1 capsule (100 mg total) by mouth every 8 (eight) hours. 21 capsule 0   camphor-menthol  (SARNA) lotion Apply topically as needed for itching. 222 mL 0   carvedilol  (COREG ) 6.25 MG tablet Take 1 tablet (6.25 mg total) by mouth 2 (two) times daily. 60  tablet 0   cyclobenzaprine  (FLEXERIL ) 5 MG tablet Take 1 tablet (5 mg total) by mouth 3 (three) times daily as needed for muscle spasms. 15 tablet 0   hydrOXYzine  (ATARAX ) 10 MG tablet Take 1 tablet (10 mg total) by mouth 3 (three) times daily as needed for itching. 30 tablet 0   lanthanum  (FOSRENOL ) 750 MG chewable tablet Chew 2 tablets (1,500 mg total) by mouth 3 (three) times daily with meals. 180 tablet 0   midodrine  (PROAMATINE ) 5 MG tablet Take 1 tablet (5 mg total) by mouth 2 (two) times daily with breakfast and lunch. 60 tablet 0   ondansetron  (ZOFRAN ) 4 MG tablet Take 1 tablet (4 mg total) by mouth every 6 (six) hours as needed for nausea or vomiting. 20 tablet 0   oxyCODONE -acetaminophen  (PERCOCET/ROXICET) 5-325 MG tablet Take 1 tablet by mouth every 6 (six) hours as needed for severe pain (pain score 7-10). 15 tablet 0   pantoprazole  (PROTONIX ) 40 MG tablet Take 1 tablet (40 mg total) by mouth at bedtime. 30 tablet 0   senna-docusate (SENOKOT-S) 8.6-50 MG tablet Take 2 tablets by mouth at bedtime. 30 tablet 0   witch hazel-glycerin  (TUCKS) pad Apply topically as needed for hemorrhoids. 40 each 12   clopidogrel  (PLAVIX ) 75 MG tablet Take 1 tablet (75 mg total) by mouth daily. 30 tablet 0   amLODipine  (NORVASC ) 10 MG tablet Place 1 tablet (10 mg total) into feeding tube daily. (Patient not taking: Reported on 02/29/2024) 30 tablet 0   insulin  aspart (NOVOLOG ) 100 UNIT/ML injection Inject 0-6 Units into the skin every 4 (four) hours. (Patient not taking: Reported on 02/29/2024) 10 mL 11   isosorbide  dinitrate (ISORDIL ) 10 MG tablet Place 0.5 tablets (5 mg total) into feeding tube 2 (two) times daily. (Patient not taking: Reported on 02/29/2024) 15 tablet 0   No facility-administered medications prior to visit.    PAST MEDICAL HISTORY: Past Medical History:  Diagnosis Date   Acute combined systolic and diastolic CHF, NYHA class 4 (HCC) 06/10/2016   Anemia    Cardiomyopathy (HCC)  06/14/2016   CHF (congestive heart failure) (HCC)    COVID 10/2020   Dyspnea    ESRD on hemodialysis (HCC)    TTS at [facility]   Hypertension    Hypertensive heart and kidney disease with heart failure (HCC) 06/14/2016   Hypertensive heart disease with congestive heart failure (HCC)    Obesity     PAST SURGICAL HISTORY: Past Surgical History:  Procedure Laterality Date   A/V FISTULAGRAM Left 05/03/2022   Procedure: A/V Fistulagram;  Surgeon: Eliza Lonni RAMAN, MD;  Location: [facility] INVASIVE CV LAB;  Service: Cardiovascular;  Laterality: Left;   AV FISTULA PLACEMENT Left 11/11/2020   Procedure: LEFT ARM First Stage Basilic Vein Fistula Creation;  Surgeon: Sheree Penne Lonni, MD;  Location[facility] OR;  Service: Vascular;  Laterality: Left;   BASCILIC VEIN TRANSPOSITION Left 01/23/2021   Procedure: Second Stage Basilic Vein Transposition of Left Arm Arteriovenous  Fistula;  Surgeon: Sheree Penne Lonni, MD;  Location: [facility] OR;  Service: Vascular;  Laterality: Left;  Block  to LMA    COLONOSCOPY     FISTULA  SUPERFICIALIZATION Left 05/07/2022   Procedure: PLICATION OF ANEURYSM, LEFT ARM FISTULA;  Surgeon: Eliza Lonni RAMAN, MD;  Location: [facility] OR;  Service: Vascular;  Laterality: Left;   IR CT HEAD LTD  10/07/2023   IR CT HEAD LTD  10/07/2023   IR PATIENT EVAL TECH 0-60 MINS  10/07/2023   IR PERCUTANEOUS ART THROMBECTOMY/INFUSION INTRACRANIAL INC DIAG ANGIO  10/07/2023   NO PAST SURGERIES     PERIPHERAL VASCULAR BALLOON ANGIOPLASTY Left 05/03/2022   Procedure: PERIPHERAL VASCULAR BALLOON ANGIOPLASTY;  Surgeon: Eliza Lonni RAMAN, MD;  Location: [facility] INVASIVE CV LAB;  Service: Cardiovascular;  Laterality: Left;  AVF   RADIOLOGY WITH ANESTHESIA N/A 10/07/2023   Procedure: RADIOLOGY WITH ANESTHESIA;  Surgeon: Radiologist, Medication, MD;  Location: [facility] OR;  Service: Radiology;  Laterality: N/A;    FAMILY HISTORY: Family History  Problem Relation Age of Onset   Hypertension Mother     Diabetes Father    Hypertension Father    Hypertension Sister    Hypertension Paternal Grandmother    Diabetes Mellitus II Paternal Grandfather    Heart disease Cousin    Heart disease Cousin    Colon cancer Cousin    Esophageal cancer Neg Hx    Rectal cancer Neg Hx    Stomach cancer Neg Hx     SOCIAL HISTORY: Social History   Socioeconomic History   Marital status: Single    Spouse name: Not on file   Number of children: Not on file   Years of education: Not on file   Highest education level: Not on file  Occupational History   Not on file  Tobacco Use   Smoking status: Former    Current packs/day: 0.00    Types: Cigarettes    Quit date: 09/2015    Years since quitting: 8.4   Smokeless tobacco: Never   Tobacco comments:    occasionally  Vaping Use   Vaping status: Former  Substance and Sexual Activity   Alcohol use: Not Currently   Drug use: Not Currently   Sexual activity: Yes  Other Topics Concern   Not on file  Social History Narrative   Epworth Sleepiness Score: 6   Social Drivers of Health   Tobacco Use: Medium Risk (02/29/2024)   Patient History    Smoking Tobacco Use: Former    Smokeless Tobacco Use: Never    Passive Exposure: Not on Actuary Strain: Not on file  Food Insecurity: No Food Insecurity (10/27/2023)   Epic    Worried About Programme Researcher, Broadcasting/film/video in the Last Year: Never true    Ran Out of Food in the Last Year: Never true  Transportation Needs: No Transportation Needs (10/27/2023)   Epic    Lack of Transportation (Medical): No    Lack of Transportation (Non-Medical): No  Physical Activity: Not on file  Stress: Not on file  Social Connections: Not on file  Intimate Partner Violence: Not At Risk (10/27/2023)   Epic    Fear of Current or Ex-Partner: No    Emotionally Abused: No    Physically Abused: No    Sexually Abused: No  Depression (PHQ2-9): Not on file  Alcohol Screen: Not on file  Housing: Low Risk (10/27/2023)    Epic    Unable to Pay for Housing in the Last Year: No    Number of Times Moved in the Last Year: 0    Homeless in the Last Year: No  Utilities: Not At Risk (10/27/2023)  Epic    Threatened with loss of utilities: No  Health Literacy: Not on file    PHYSICAL EXAM  GENERAL EXAM/CONSTITUTIONAL: Vitals:  Vitals:   02/29/24 1310  BP: 135/83  Pulse: 83  SpO2: 95%  Weight: 247 lb (112 kg)  Height: 6' 1 (1.854 m)   Body mass index is 32.59 kg/m. Wt Readings from Last 3 Encounters:  02/29/24 247 lb (112 kg)  02/22/24 289 lb 7.4 oz (131.3 kg)  10/18/23 282 lb 6.6 oz (128.1 kg)   Patient is in no distress; well developed, nourished and groomed; neck is supple  MUSCULOSKELETAL: Gait, strength, tone, movements noted in Neurologic exam below  NEUROLOGIC: MENTAL STATUS:      No data to display         awake, alert, oriented to person, place and time recent and remote memory intact normal attention and concentration Mild to moderate dysarthria, comprehension intact, naming intact fund of knowledge appropriate  CRANIAL NERVE:  2nd, 3rd, 4th, 6th - Left visual field cut, upper and lower field, extraocular muscles intact, no nystagmus 5th - facial sensation symmetric 7th - facial strength symmetric 8th - hearing intact 9th - palate elevates symmetrically, uvula midline 11th - shoulder shrug symmetric 12th - tongue protrusion midline  MOTOR:  normal bulk and tone, full strength in the BUE, BLE, except for 4/5 left hip flexion  SENSORY:  normal and symmetric to light touch  COORDINATION:  finger-nose-finger, fine finger movements normal  GAIT/STATION:  Normal     DIAGNOSTIC DATA (LABS, IMAGING, TESTING) - I reviewed patient records, labs, notes, testing and imaging myself where available.  Lab Results  Component Value Date   WBC 6.7 11/01/2023   HGB 10.4 (L) 11/01/2023   HCT 32.2 (L) 11/01/2023   MCV 100.0 11/01/2023   PLT 239 11/01/2023       Component Value Date/Time   NA 133 (L) 11/01/2023 1733   K 5.4 (H) 11/01/2023 1733   CL 88 (L) 11/01/2023 1733   CO2 25 11/01/2023 1733   GLUCOSE 91 11/01/2023 1733   BUN 88 (H) 11/01/2023 1733   CREATININE 18.41 (H) 11/01/2023 1733   CALCIUM  9.2 11/01/2023 1733   PROT 6.1 (L) 10/08/2023 0505   ALBUMIN 3.4 (L) 11/01/2023 1733   AST 10 (L) 10/08/2023 0505   ALT 11 10/08/2023 0505   ALKPHOS 66 10/08/2023 0505   BILITOT 0.8 10/08/2023 0505   GFRNONAA 3 (L) 11/01/2023 1733   GFRAA 32 (L) 05/13/2017 1653   Lab Results  Component Value Date   CHOL 130 10/08/2023   HDL 23 (L) 10/08/2023   LDLCALC UNABLE TO CALCULATE IF TRIGLYCERIDE OVER 400 mg/dL 92/73/7974   LDLDIRECT 85 10/08/2023   TRIG 203 (H) 10/08/2023   CHOLHDL 5.7 10/08/2023   Lab Results  Component Value Date   HGBA1C 5.1 10/08/2023   No results found for: VITAMINB12 No results found for: TSH  MRI Brain 10/08/2023 1. Large right MCA territory infarct with associated diffuse cortical thickening and some mass effect, but no significant midline shift. No focal hemorrhage. 2. Moderately advanced periventricular T2 hyperintensities for age. This likely reflects sequela of chronic microvascular ischemia. 3. Diffuse sinus disease likely related to intubated status.  MRA Head 10/08/2023 1. Distal right M2 occlusion, somewhat more proximal than on the final angio images. No new occlusion is present.    ASSESSMENT AND PLAN  48 y.o. year old male with history of hypertension, hyperlipidemia, obesity, end-stage renal disease on dialysis, heart failure  who is presenting after a large left MCA stroke territory stroke.  He was treated with TNK then thrombectomy, he achieved a TICI 2b revascularization.  Patient stroke etiology likely large vessel disease due to the M2 occlusion but cannot rule out cardioembolic.  He did very well with recovery, he has mild left visual field cut, slurred speech, and trouble with dexterity with his  left hand.  He is right-handed.  Plan for patient is to continue current medications, continue with occupational therapy.  He is set to start speech therapy next week.  He will continue to follow-up with PCP and will call us  with any additional question or concerns. Consider ophthalmology consultation due to the visual field cut.   He is also scheduled for a radical nephrectomy in January.  Since his stroke occurred in July, 6 months ago he can proceed with the surgery.  However we will still recommend that he stop Aspirin  7 days prior to surgery.  Patient understands that there is a small but acceptable risk of stroke while being off aspirin .  Return sooner if worse.   1. Cerebrovascular accident (CVA) due to stenosis of right middle cerebral artery (HCC)   2. Slurred speech      Patient Instructions  Discontinue Clopidogrel  (Plavix ) Continue with Aspirin   Continue your other medications  Continue with speech and occupation therapy  Will complete surgical clearance form (must be 6 months from the stroke date).  Stop aspirin  7 days prior to surgery Consider Ophthalmology consultation due to visual field cut Continue to follow up with PCP  Return as needed   No orders of the defined types were placed in this encounter.   No orders of the defined types were placed in this encounter.   Return if symptoms worsen or fail to improve.  I personally spent a total of 60 minutes in the care of the patient today including preparing to see the patient, getting/reviewing separately obtained history, performing a medically appropriate exam/evaluation, counseling and educating, placing orders, documenting clinical information in the EHR, independently interpreting results, communicating results, and discussing his surgery, risk of stroke peri-procedure and need to stop Aspirin  7 days prior to surgery.   Pastor Falling, MD 02/29/2024, 3:32 PM  [facility] [address] [place], [place] [address] [phone]     [1] No Known Allergies

## 2024-02-29 NOTE — Patient Instructions (Addendum)
 Discontinue Clopidogrel  (Plavix ) Continue with Aspirin   Continue your other medications  Continue with speech and occupation therapy  Will complete surgical clearance form (must be 6 months from the stroke date).  Stop aspirin  7 days prior to surgery Consider Ophthalmology consultation due to visual field cut Continue to follow up with PCP  Return as needed

## 2024-03-01 ENCOUNTER — Telehealth: Payer: Self-pay | Admitting: *Deleted

## 2024-03-01 NOTE — Telephone Encounter (Signed)
° ° °  Faxed to [phone] confirmation received.

## 2024-03-05 ENCOUNTER — Ambulatory Visit

## 2024-03-12 ENCOUNTER — Ambulatory Visit

## 2024-03-12 NOTE — Telephone Encounter (Signed)
 Per Dr. Gregg.  He signed and did note.  Nothing additional needed.  He is not medical or cardiac.  If they need to speak to him he is glad to do so. This was faxed to [facility] as well.

## 2024-03-12 NOTE — Telephone Encounter (Signed)
° ° °  Faxed to [phone] confirmation received.

## 2024-03-12 NOTE — Telephone Encounter (Signed)
 I called and spoke to courtney.  The question does need to be address and initialed and dated.

## 2024-03-12 NOTE — Telephone Encounter (Signed)
 Courtney from [facility] called  to request to speak to Someone about Pt Pre-OP  Sudie form they received   Callback number is  [phone]

## 2024-03-13 ENCOUNTER — Ambulatory Visit

## 2024-03-19 ENCOUNTER — Ambulatory Visit: Attending: Family Medicine

## 2024-03-19 ENCOUNTER — Ambulatory Visit

## 2024-03-19 ENCOUNTER — Other Ambulatory Visit: Payer: Self-pay

## 2024-03-19 DIAGNOSIS — R41841 Cognitive communication deficit: Secondary | ICD-10-CM

## 2024-03-19 DIAGNOSIS — R471 Dysarthria and anarthria: Secondary | ICD-10-CM | POA: Insufficient documentation

## 2024-03-19 DIAGNOSIS — R41840 Attention and concentration deficit: Secondary | ICD-10-CM | POA: Insufficient documentation

## 2024-03-19 DIAGNOSIS — R29818 Other symptoms and signs involving the nervous system: Secondary | ICD-10-CM | POA: Insufficient documentation

## 2024-03-19 DIAGNOSIS — R208 Other disturbances of skin sensation: Secondary | ICD-10-CM | POA: Insufficient documentation

## 2024-03-19 DIAGNOSIS — M6281 Muscle weakness (generalized): Secondary | ICD-10-CM | POA: Insufficient documentation

## 2024-03-19 DIAGNOSIS — R29898 Other symptoms and signs involving the musculoskeletal system: Secondary | ICD-10-CM | POA: Insufficient documentation

## 2024-03-19 DIAGNOSIS — I63411 Cerebral infarction due to embolism of right middle cerebral artery: Secondary | ICD-10-CM | POA: Insufficient documentation

## 2024-03-19 DIAGNOSIS — I69854 Hemiplegia and hemiparesis following other cerebrovascular disease affecting left non-dominant side: Secondary | ICD-10-CM | POA: Insufficient documentation

## 2024-03-19 DIAGNOSIS — R278 Other lack of coordination: Secondary | ICD-10-CM | POA: Insufficient documentation

## 2024-03-19 NOTE — Therapy (Signed)
 " OUTPATIENT SPEECH LANGUAGE PATHOLOGY EVALUATION   Patient Name: Nathan Campbell MRN: [id number] DOB:04/20/1975, 49 y.o., male Today's Date: 03/19/2024  PCP: Benjamine Aland, MD REFERRING PROVIDER: Sebastian Jerilyn HERO, FNP  END OF SESSION:  End of Session - 03/19/24 1015     Visit Number 1    Number of Visits 17    Date for Recertification  05/14/24    SLP Start Time 0934    SLP Stop Time  1015    SLP Time Calculation (min) 41 min    Activity Tolerance Patient tolerated treatment well          Past Medical History:  Diagnosis Date   Acute combined systolic and diastolic CHF, NYHA class 4 (HCC) 06/10/2016   Anemia    Cardiomyopathy (HCC) 06/14/2016   CHF (congestive heart failure) (HCC)    COVID 10/2020   Dyspnea    ESRD on hemodialysis (HCC)    TTS at [facility]   Hypertension    Hypertensive heart and kidney disease with heart failure (HCC) 06/14/2016   Hypertensive heart disease with congestive heart failure (HCC)    Obesity    Past Surgical History:  Procedure Laterality Date   A/V FISTULAGRAM Left 05/03/2022   Procedure: A/V Fistulagram;  Surgeon: Eliza Lonni RAMAN, MD;  Location: [facility] INVASIVE CV LAB;  Service: Cardiovascular;  Laterality: Left;   AV FISTULA PLACEMENT Left 11/11/2020   Procedure: LEFT ARM First Stage Basilic Vein Fistula Creation;  Surgeon: Sheree Penne Lonni, MD;  Location: [facility] OR;  Service: Vascular;  Laterality: Left;   BASCILIC VEIN TRANSPOSITION Left 01/23/2021   Procedure: Second Stage Basilic Vein Transposition of Left Arm Arteriovenous  Fistula;  Surgeon: Sheree Penne Lonni, MD;  Location: [facility] OR;  Service: Vascular;  Laterality: Left;  Block  to LMA    COLONOSCOPY     FISTULA SUPERFICIALIZATION Left 05/07/2022   Procedure: PLICATION OF ANEURYSM, LEFT ARM FISTULA;  Surgeon: Eliza Lonni RAMAN, MD;  Location: [facility] OR;  Service: Vascular;  Laterality: Left;   IR CT HEAD LTD  10/07/2023   IR CT HEAD LTD  10/07/2023   IR PATIENT  EVAL TECH 0-60 MINS  10/07/2023   IR PERCUTANEOUS ART THROMBECTOMY/INFUSION INTRACRANIAL INC DIAG ANGIO  10/07/2023   NO PAST SURGERIES     PERIPHERAL VASCULAR BALLOON ANGIOPLASTY Left 05/03/2022   Procedure: PERIPHERAL VASCULAR BALLOON ANGIOPLASTY;  Surgeon: Eliza Lonni RAMAN, MD;  Location: [facility] INVASIVE CV LAB;  Service: Cardiovascular;  Laterality: Left;  AVF   RADIOLOGY WITH ANESTHESIA N/A 10/07/2023   Procedure: RADIOLOGY WITH ANESTHESIA;  Surgeon: Radiologist, Medication, MD;  Location: [facility] OR;  Service: Radiology;  Laterality: N/A;   Patient Active Problem List   Diagnosis Date Noted   Stroke (HCC) 11/04/2023   Cognitive and neurobehavioral dysfunction 10/24/2023   Acute hypoxic respiratory failure (HCC) 10/17/2023   Essential hypertension 10/17/2023   Dysphagia 10/17/2023   Leukocytosis 10/17/2023   Obesity 10/17/2023   Acute ischemic stroke (HCC) 10/07/2023   Middle cerebral artery embolism, right 10/07/2023   ESRD (end stage renal disease) (HCC) 03/26/2022   Hyperlipidemia, unspecified 03/20/2021   Acute gout due to renal impairment involving left wrist 04/24/2019   Malignant hypertension 10/14/2016   Combined congestive systolic and diastolic heart failure (HCC) 06/14/2016   NICM (nonischemic cardiomyopathy) (HCC) 06/14/2016   Renal failure    Hypertensive urgency 06/10/2016   CKD (chronic kidney disease), stage IV (HCC) 06/10/2016   Normocytic anemia 06/10/2016    ONSET DATE: 01/17/2024  REFERRING DIAG: R13.10 (ICD-10-CM) - Dysphagia, unspecified  THERAPY DIAG:  Dysarthria and anarthria  Cognitive communication deficit  Rationale for Evaluation and Treatment: Rehabilitation  SUBJECTIVE:   SUBJECTIVE STATEMENT: I think my speech is terrible. Pt accompanied by: self  PERTINENT HISTORY: Pt is a 49 yo male presenting to this clinic with hemiplegia s/p R MCA embolism. Pt was hospitalized for this from 10/07/23-10/18/23, then discharged to neurology and received  IP PT/OT/SLP 10/18/23-11/04/23. Initial CT head revealed right MCA territory ischemic changes with calcific density in proximal right M1 concerning for calcific embolus.  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 Pt likes to play video games and watch sports at home. Pt notes that he feels that his speech is terrible while family or other people around him state that his speech is clearer.   PAIN:  Are you having pain? No  FALLS: Has patient fallen in last 6 months?  See PT evaluation for details  LIVING ENVIRONMENT: Lives with: lives alone Lives in: House/apartment  PLOF:  Level of assistance: Independent with ADLs Employment: Full-time employment  PATIENT GOALS: To get back to work.  OBJECTIVE:  Note: Objective measures were completed at Evaluation unless otherwise noted.  DIAGNOSTIC FINDINGS: MRI BRAIN WITHOUT CONTRAST 10/08/2023 12:38:36 PM   TECHNIQUE: Multiplanar multisequence MRI of the head/brain was performed without the administration of intravenous contrast.   COMPARISON: CT head without contrast 10/07/2023.   CLINICAL HISTORY: Stroke, follow up.   FINDINGS:   BRAIN AND VENTRICLES: A large right MCA (middle cerebral artery) territory infarct is present. The right caudate head and lentiform nucleus are infarcted. Insular ribbon and ganglionic and supraganglionic cortical infarcts are noted. A focal area of cortical infarction is present in the medial anterior right frontal lobe. No focal hemorrhage is present. Diffuse T2 and FLAIR hyperintensities are present throughout the infarcted area. Additional periventricular T2 hyperintensities are moderately advanced for age. Diffuse cortical thickening is present with some mass effect but no significant midline shift.   ORBITS: No acute abnormality.   SINUSES AND MASTOIDS: Fluid levels are present in the posterior ethmoid air cells, the right maxillary sinus and bilateral sphenoid  sinuses. Fluid is present in the nasopharynx.   BONES AND SOFT TISSUES: Normal marrow signal. No acute soft tissue abnormality.   IMPRESSION: 1. Large right MCA territory infarct with associated diffuse cortical thickening and some mass effect, but no significant midline shift. No focal hemorrhage. 2. Moderately advanced periventricular T2 hyperintensities for age. This likely reflects sequela of chronic microvascular ischemia. 3. Diffuse sinus disease likely related to intubated status.  COGNITION: Overall cognitive status: Impaired: Areas of impairment:  Attention: Impaired: Sustained, Selective Memory: Impaired: Working It Consultant function: Impaired: Impulse control, Problem solving, Organization, Planning, and Error awareness Behavior: Impulsive  Areas of impairment:  Look above at overall cognitive status. Functional deficits: Pt forgets where he puts objects in his apartment.   AUDITORY COMPREHENSION: Overall auditory comprehension: Appears intact   EXPRESSION: verbal    MOTOR SPEECH: Overall motor speech: impaired Level of impairment: Sentence and Conversation Respiration: thoracic breathing Phonation: normal and low vocal intensity Resonance: WFL Articulation: Impaired: sentence and conversation Intelligibility: Intelligibility reduced Motor planning: Appears intact Motor speech errors: aware and consistent Interfering components: N/A Effective technique: increased vocal intensity  ORAL MOTOR EXAMINATION: Overall status: WFL Comments: N/A   STANDARDIZED ASSESSMENTS: CLQT: Clock Drawing: Severe Other portions of CLQT are in progress.   PATIENT REPORTED OUTCOME MEASURES (PROM): To be  completed in upcoming session.                                                                                                                            TREATMENT DATE:   03/19/24: Evaluation in progress. CLQT was started but will need to be finished in  upcoming session. Motor speech evaluation completed. Pt demonstrated increased tiredness during session, which pt attributed to less sleep from previous night. No tx completed on this date.   PATIENT EDUCATION: Education details: Evaluation Results, using a dump basket for items to decrease forgetfulness of essential items.  Person educated: Patient Education method: Explanation Education comprehension: verbalized understanding and needs further education   GOALS: Goals reviewed with patient? Yes  SHORT TERM GOALS: Target date: 04/19/24  Pt will achieve 99-100% speech intelligibility during a ~8 minute conversation given occasional min A for dysarthria strategies (increased loudness, over-articulation, etc.).  Baseline: Goal status: INITIAL  2.  Pt will demonstrate use of memory strategies for optimizing short-term memory recall independently across 2 sessions given occasional min A. Baseline:  Goal status: INITIAL  3.  Pt will complete PROM for speech. Baseline:  Goal status: INITIAL  4.  Pt will complete CLQT.  Baseline:  Goal status: INITIAL  5.  Pt will display adequate selective attention to perform functional, work related tasks using cognitive-communication strategies with 90% consistency given occasional min A. Baseline:  Goal status: INITIAL  6.  TBD Baseline:  Goal status: INITIAL  LONG TERM GOALS: Target date: 05/14/24  Pt will achieve 99-100% speech intelligibility during a >15 minute conversation independently.  Baseline:  Goal status: INITIAL  2.  Pt will demonstrate use of memory strategies for optimizing short-term memory recall independently across 4 sessions. Baseline:  Goal status: INITIAL  3.  Pt will demonstrate adequate cognitive-communication skills required for returning to work.  Baseline:  Goal status: INITIAL  4.  Pt will independently carry-over HEP across 6 sessions for optimizing generalization of dysarthria and  cognitive-communication strategies. Baseline:  Goal status: INITIAL  5.  TBD Baseline:  Goal status: INITIAL  6.  TBD Baseline:  Goal status: INITIAL  ASSESSMENT:  CLINICAL IMPRESSION: Patient is a 49 y.o. male who was seen today for cognitive and motor speech evaluation s/p R MCA embolism. Pt demonstrated increased tiredness during evaluation tasks, which could have impacted results. Pt presents with mild-moderate dysarthria characterized by consistent articulatory breakdown at the sentence level and decreased vocal intensity, especially in longer utterances. Pt also presents with mild-moderate cognitive-communication difficulties in the areas of short-term memory, selective/sustained attention, and executive function. Pt notes that he thinks that his speech is terrible and it impacts his conversations with family and friends. 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 Pt notes that he has difficulties with his short-term memory, especially for important objects at home. During current cognitive assessment tasks, pt demonstrated increased impulsivity, which decreased selective attention to tasks.  ST is recommended due to impacts of dysarthria and cognitive-communication deficits on pt's ADLs and communication. Without ST, pt is at risk of not safely returning to his previous job due to cognitive-communication deficits and increased social isolation due to current dysarthria, which highly impacts pt's QOL.   OBJECTIVE IMPAIRMENTS: include attention, memory, executive functioning, and dysarthria. These impairments are limiting patient from return to work, ADLs/IADLs, and effectively communicating at home and in community. Factors affecting potential to achieve goals and functional outcome are N/A. Patient will benefit from skilled SLP services to address above impairments and improve overall function.  REHAB POTENTIAL: Good  PLAN:  SLP FREQUENCY:  2x/week  SLP DURATION: 8 weeks  PLANNED INTERVENTIONS: Environmental controls, Cognitive reorganization, Internal/external aids, Functional tasks, SLP instruction and feedback, Compensatory strategies, Patient/family education, and [id number] Treatment of speech (30 or 45 min)     Waddell Music, CF-SLP 03/19/2024, 11:30 AM      "

## 2024-03-19 NOTE — Patient Instructions (Signed)
 Get a dump basket (anything

## 2024-03-19 NOTE — Therapy (Signed)
 " OUTPATIENT OCCUPATIONAL THERAPY NEURO TREATMENT   Patient Name: Nathan Campbell MRN: [id number] DOB:11/29/1975, 49 y.o., male Today's Date: 03/19/2024  PCP: Benjamine Aland, MD REFERRING PROVIDER: Sebastian Jerilyn HERO, FNP  END OF SESSION:  OT End of Session - 03/19/24 0838     Visit Number 15    Number of Visits 22    Date for Recertification  05/11/24    Authorization Type [employer] [facility]    Authorization Time Period 02/27/24-05/07/24    OT Start Time 0837    OT Stop Time 0930    OT Time Calculation (min) 53 min    Equipment Utilized During Treatment flex bar, juxicisor, hammer, rice bin,    Activity Tolerance Patient tolerated treatment well;Patient limited by lethargy    Behavior During Therapy WFL for tasks assessed/performed            Past Medical History:  Diagnosis Date   Acute combined systolic and diastolic CHF, NYHA class 4 (HCC) 06/10/2016   Anemia    Cardiomyopathy (HCC) 06/14/2016   CHF (congestive heart failure) (HCC)    COVID 10/2020   Dyspnea    ESRD on hemodialysis (HCC)    TTS at [facility]   Hypertension    Hypertensive heart and kidney disease with heart failure (HCC) 06/14/2016   Hypertensive heart disease with congestive heart failure (HCC)    Obesity    Past Surgical History:  Procedure Laterality Date   A/V FISTULAGRAM Left 05/03/2022   Procedure: A/V Fistulagram;  Surgeon: Eliza Lonni RAMAN, MD;  Location: [facility] INVASIVE CV LAB;  Service: Cardiovascular;  Laterality: Left;   AV FISTULA PLACEMENT Left 11/11/2020   Procedure: LEFT ARM First Stage Basilic Vein Fistula Creation;  Surgeon: Sheree Penne Lonni, MD;  Location: [facility] OR;  Service: Vascular;  Laterality: Left;   BASCILIC VEIN TRANSPOSITION Left 01/23/2021   Procedure: Second Stage Basilic Vein Transposition of Left Arm Arteriovenous  Fistula;  Surgeon: Sheree Penne Lonni, MD;  Location: [facility] OR;  Service: Vascular;  Laterality: Left;  Block  to LMA    COLONOSCOPY     FISTULA  SUPERFICIALIZATION Left 05/07/2022   Procedure: PLICATION OF ANEURYSM, LEFT ARM FISTULA;  Surgeon: Eliza Lonni RAMAN, MD;  Location: [facility] OR;  Service: Vascular;  Laterality: Left;   IR CT HEAD LTD  10/07/2023   IR CT HEAD LTD  10/07/2023   IR PATIENT EVAL TECH 0-60 MINS  10/07/2023   IR PERCUTANEOUS ART THROMBECTOMY/INFUSION INTRACRANIAL INC DIAG ANGIO  10/07/2023   NO PAST SURGERIES     PERIPHERAL VASCULAR BALLOON ANGIOPLASTY Left 05/03/2022   Procedure: PERIPHERAL VASCULAR BALLOON ANGIOPLASTY;  Surgeon: Eliza Lonni RAMAN, MD;  Location: [facility] INVASIVE CV LAB;  Service: Cardiovascular;  Laterality: Left;  AVF   RADIOLOGY WITH ANESTHESIA N/A 10/07/2023   Procedure: RADIOLOGY WITH ANESTHESIA;  Surgeon: Radiologist, Medication, MD;  Location: [facility] OR;  Service: Radiology;  Laterality: N/A;   Patient Active Problem List   Diagnosis Date Noted   Stroke (HCC) 11/04/2023   Cognitive and neurobehavioral dysfunction 10/24/2023   Acute hypoxic respiratory failure (HCC) 10/17/2023   Essential hypertension 10/17/2023   Dysphagia 10/17/2023   Leukocytosis 10/17/2023   Obesity 10/17/2023   Acute ischemic stroke (HCC) 10/07/2023   Middle cerebral artery embolism, right 10/07/2023   ESRD (end stage renal disease) (HCC) 03/26/2022   Hyperlipidemia, unspecified 03/20/2021   Acute gout due to renal impairment involving left wrist 04/24/2019   Malignant hypertension 10/14/2016   Combined congestive systolic and diastolic heart failure (  HCC) 06/14/2016   NICM (nonischemic cardiomyopathy) (HCC) 06/14/2016   Renal failure    Hypertensive urgency 06/10/2016   CKD (chronic kidney disease), stage IV (HCC) 06/10/2016   Normocytic anemia 06/10/2016    ONSET DATE: 12/19/2023 referral date Admit date: 10/18/2023 Discharge date: 11/04/2023  REFERRING DIAG: G81.90 (ICD-10-CM) - Hemiplegia, unspecified affecting unspecified side  THERAPY DIAG:  Muscle weakness (generalized)  Other lack of coordination  Other  symptoms and signs involving the nervous system  Hemiplegia and hemiparesis following other cerebrovascular disease affecting left non-dominant side (HCC)  Other disturbances of skin sensation  Rationale for Evaluation and Treatment: Rehabilitation  SUBJECTIVE:   SUBJECTIVE STATEMENT: Pt reports he opened a jar himself the other day.  New apartment 03/06/24 [place]   Pt accompanied by: self  PERTINENT HISTORY: Pt is a 49 yo male presenting to this clinic with hemiplegia s/p R MCA embolism. Pt was hospitalized for this from 10/07/23-10/18/23, then discharged to neurology and received IP PT/OT/SLP 10/18/23-11/04/23. Initial CT head revealed right MCA territory ischemic changes with calcific density in proximal right M1 concerning for calcific embolus.   CHF, CM, ESRD on HD TTS, HTN, obesity, HLD  PRECAUTIONS: Fall, L sided weakness, blood thinners  WEIGHT BEARING RESTRICTIONS: No  PAIN:  Are you having pain? No  FALLS: Has patient fallen in last 6 months? No  LIVING ENVIRONMENT: Lives with: lives with their family sister and her 2 sons. Pt reports will be getting own place soon. Lives in: House/apartment townhouse, 2 level Stairs: Yes: Internal: 3 steps; on right going up Has following equipment at home: None  PLOF: Independent and Vocation/Vocational requirements: Worked in distribution in [employer]   PATIENT GOALS: Get back movement in my left arm, return to work, and be able to play video games like I was before.  OBJECTIVE:  Note: Objective measures were completed at Evaluation unless otherwise noted.  HAND DOMINANCE: Right  ADLs: Overall ADLs: Independent Transfers/ambulation related to ADLs: Independent Eating: Independent Grooming: Independent UB Dressing: Independent LB Dressing: Independent Toileting: Independent Bathing: Independent Tub Shower transfers: Independent, shower/tub combo Equipment: none  IADLs: Shopping: Independent Light  housekeeping: Mom is doing pharmacologist.  Meal Prep: Needs some help with opening cans and jars d/t weakness in L hand Community mobility: Mother driving pt to appts  Medication management: Mother helping with medication mgmt Financial management: Independent Handwriting: did not assess d/t pt report of no changes and pt dominant hand unaffected.   MOBILITY STATUS: Independent  POSTURE COMMENTS:  No Significant postural limitations Sitting balance: WFL  ACTIVITY TOLERANCE: Activity tolerance: No changes  FUNCTIONAL OUTCOME MEASURES: Quick Dash: 47.7/100: 47.7% impairment 01/30/24: 52.3% impairment 02/2024 20.5  02/17/24:  UPPER EXTREMITY ROM:  R WNL  Active ROM Right eval Left eval  Shoulder flexion  92  Shoulder abduction  70  Wrist flexion  10  Wrist extension  40  Wrist pronation    Wrist supination  Impaired (re-assess)  (Blank rows = not tested)  UPPER EXTREMITY MMT:   WFL RUE, 3-/5 grossly for LUE  HAND FUNCTION: Grip strength: Right: 61.2 lbs; Left: 15.8 lbs 01/20/24: Left: 19.4 lbs  Grip   COORDINATION: 9 Hole Peg test: Right: 41.60 sec; Left: unable to pick up a peg in 2 minutes sec Box and Blocks:  Right 36 blocks, Left 11blocks 01/20/24: Left: 15 blocks 03/19/24: 19 blocks L hand, 38 blocks R hand   SENSATION: WFL  EDEMA: none  MUSCLE TONE: LUE: Rigidity  COGNITION: Overall cognitive status:  Within functional limits for tasks assessed  VISION: Subjective report: no concerns Baseline vision: No visual deficits Visual history: no hx  VISION ASSESSMENT: WFL To be further assessed in functional context  Patient has difficulty with following activities due to following visual impairments: none  PERCEPTION: WFL  PRAXIS: Impaired: Ideomotor  OBSERVATIONS: Weakness in L hand and limited ROM in LUE affecting ability to complete ADL/IADL.                                                                                                                             TREATMENT: 03/19/24  - Self-care/home management completed for duration as noted below including:  Checked Box and Blocks this date and grip strength. Slight decrease in average L grip strength, noted improvement in coordination in L hand. See hand function above or goals below for updated measurement.   - Therapeutic exercises completed for duration as noted below including:  Completed weighted pronation/supination with hammer, then with flex bars tan and yellow completed 2x10 reps of wrist flexion, radial/ulnar deviation, wrist extension. With velcro board completed approximately 2x10 forearm pronation/supination, wrist flexion/extension.  - Therapeutic activities completed for duration as noted below including:  Completed L wrist AROM with Jux-a-cisor to improve wrist ROM, prolonged grasp, and sequencing/motor planning.  Completed functional rake and grasp with L hand in rice bin retrieving different sized items. With resistive clamp at level 1 resistance and silver coil attempted to remove pegs from peg board and place in bucket, however some difficulty noted d/t slight resistance in pegs, therefore modified activity to allow pt to pick up blocks with clamp from table and put in bucket. Pt demonstrated increased ease with this.  Placement and removal of yellow and red resistive clips with use of left 3 point pinch for strengthening of affected extremity. Modification allowed by OT holding clips for increased ease in grasping.    PATIENT EDUCATION: Education details:  Person educated: Patient Education method: Programmer, Multimedia, Facilities Manager, Actor cues, Verbal cues, and Handouts Education comprehension: verbalized understanding, returned demonstration, verbal cues required, and needs further education  HOME EXERCISE PROGRAM: 12/30/23: Shoulder, Forearm/wrist, and hand HEP  01/02/24: Theraputty HEP with red putty (ACCESS CODE [id number]) 01/11/24: Shoulder shrug exercises (same  access code as above) 01/16/24: E-stim and saebo glove [employer] 01/20/24: Nonie opener [employer] 02/17/24: Coordination activities (see Pt instructions) 02/27/24: Leonel exercises (same acces code)    GOALS: Goals reviewed with patient? Yes   NEW SHORT TERM GOALS: Target date: 04/12/24  1. Pt will be able to place at least 20 blocks using left hand with completion of Box and Blocks test.   Baseline at initial eval: 15 blocks 01/30/24: 8 blocks 02/17/24: 14 blocks 03/19/24: 19 blocks  NEW LONG TERM GOALS: Target date: 05/11/24 1. Patient will demonstrate at least 40 lbs L grip strength as needed to open jars and other containers.   Baseline at initial eval: 19.2  01/30/24: 18.27 lbs  02/17/24: 25.5  lbs average   03/19/14: 21.7 lbs average  ASSESSMENT:  CLINICAL IMPRESSION: Patient is a 49 y.o. male who was seen today for occupational therapy tx for L sided weakness s/p stroke. Pt participating well with strengthening and has made improvement with coordination as evidenced by increased score in Box and Blocks. Slight decrease in grip strength average from last measurement, however it is still an improvement from initial measurement at eval. Pt would benefit from continued skilled services to continue working on FM coordination and grip strength of L hand to promote return to PLOF and work as able.  PERFORMANCE DEFICITS: in functional skills including ADLs, IADLs, coordination, dexterity, tone, ROM, strength, pain, Fine motor control, body mechanics, cardiopulmonary status limiting function, and UE functional use, cognitive skills including attention, safety awareness, and sequencing, and psychosocial skills including coping strategies, interpersonal interactions, and routines and behaviors.   IMPAIRMENTS: are limiting patient from IADLs, rest and sleep, work, and leisure.   CO-MORBIDITIES: has co-morbidities such as ESRD that affects occupational performance. Patient will  benefit from skilled OT to address above impairments and improve overall function.  REHAB POTENTIAL: Good   PLAN:  OT FREQUENCY: 1x/week  OT DURATION: additional 10 weeks  PLANNED INTERVENTIONS: 97168 OT Re-evaluation, 97535 self care/ADL training, 02889 therapeutic exercise, 97530 therapeutic activity, 97112 neuromuscular re-education, 97140 manual therapy, 97039 fluidotherapy, 97032 electrical stimulation (manual), 97760 Orthotic Initial, [id number] Orthotic/Prosthetic subsequent, passive range of motion, functional mobility training, visual/perceptual remediation/compensation, energy conservation, patient/family education, and DME and/or AE instructions  RECOMMENDED OTHER SERVICES: none noted  CONSULTED AND AGREED WITH PLAN OF CARE: Patient  PLAN FOR NEXT SESSION:  Coordination/ strengthening activities ROM activities WB activities  [place], OT 03/19/2024, 9:12 AM           "

## 2024-03-21 ENCOUNTER — Ambulatory Visit

## 2024-03-26 ENCOUNTER — Ambulatory Visit (HOSPITAL_BASED_OUTPATIENT_CLINIC_OR_DEPARTMENT_OTHER): Attending: Family Medicine | Admitting: Pulmonary Disease

## 2024-03-26 DIAGNOSIS — R5383 Other fatigue: Secondary | ICD-10-CM

## 2024-03-26 DIAGNOSIS — G47429 Narcolepsy in conditions classified elsewhere without cataplexy: Secondary | ICD-10-CM

## 2024-03-26 DIAGNOSIS — G4733 Obstructive sleep apnea (adult) (pediatric): Secondary | ICD-10-CM | POA: Diagnosis not present

## 2024-03-26 DIAGNOSIS — R400 Somnolence: Secondary | ICD-10-CM | POA: Diagnosis present

## 2024-03-27 ENCOUNTER — Ambulatory Visit

## 2024-03-27 ENCOUNTER — Telehealth: Payer: Self-pay

## 2024-03-27 NOTE — Telephone Encounter (Signed)
 Pt no-showed for OT appointment today. Therefore, OT called pt's listed phone number [phone]). OT left voicemail reminding pt of next OT appointment date/time, provided education on cancellation/no-show policy, recommended to pt to call clinic office if pt is unable to make appointments, and provided contact information of clinic.  Rocky Dutch, OTR/L

## 2024-04-01 DIAGNOSIS — G4733 Obstructive sleep apnea (adult) (pediatric): Secondary | ICD-10-CM | POA: Diagnosis not present

## 2024-04-01 NOTE — Procedures (Signed)
 " Indications for Polysomnography The patient is a 49 year old Male who is 6' 1 and weighs 247.0 lbs.  His BMI equals 32.7.  A diagnostic polysomnogram was performed to evaluate for -.  After 124.0 minutes of sleep time the patient exhibited sufficient respiratory events qualifying him  for a CPAP trial which was then initiated.  Medication was taken at 9pm.-Stool SoftenerIsordilFlexeril Polysomnogram Data A full night polysomnogram was performed recording the standard physiologic parameters including EEG, EOG, EMG, EKG, nasal and oral airflow.  Respiratory parameters of chest and abdominal movements are recorded with Piezo-Crystal motion transducers.   Oxygen saturation was recorded by pulse oximetry.  Sleep Architecture The total recording time of the diagnostic portion of the study was 129.9 minutes.  The total sleep time was 124.0 minutes.  During the diagnostic portion of the study, the patient spent 0.4% of total sleep time in Stage N1, 62.9% in Stage N2, 9.7% in  Stages N3, and 27.0% in REM.   Sleep latency was 2.4 minutes.  REM latency was 55.0 minutes.  Sleep Efficiency was 95.5%.  Wake after Sleep Onset time was 3.5 minutes.  At 11:49:24 PM the patient was placed on PAP treatment and was titrated at pressures ranging from 8* cm/H20 with supplemental oxygen at - up to 17* cm/H20 with supplemental oxygen at -.  The total recording time of the treatment portion of the study was  295.4 minutes.  The total sleep time was 225.5 minutes.  During the treatment portion of the study, the patient spent 4.2% of total sleep time in Stage N1, 44.6% in Stage N2, 30.8% in Stages N3, and 20.4% in REM.   Sleep latency was 0.5 minutes.  REM  latency was 69.5 minutes.  Sleep Efficiency was 76.3%.  Wake after Sleep Onset time was 69.0 minutes.  Respiratory Events During the diagnostic portion of the study, the polysomnogram revealed a presence of 14 obstructive, 12 centrals, and - mixed apneas resulting in  an Apnea index of 12.6 events per hour.  There were 48 hypopneas (GreaterEqual to3% desaturation and/or  arousal) resulting in an Apnea\Hypopnea Index (AHI GreaterEqual to3% desaturation and/or arousal) of 35.8 events per hour.  There were 34 hypopneas (GreaterEqual to4% desaturation) resulting in an Apnea\Hypopnea Index (AHI GreaterEqual to4% desaturation)  of 29.0 events per hour.  There were 4 Respiratory Effort Related Arousals resulting in a RERA index of 1.9 events per hour. The Respiratory Disturbance Index is 37.7 events per hour.  The snore index was - events per hour.  Mean oxygen saturation was  88.3%.  The lowest oxygen saturation during sleep was 47.0%.  Time spent LessEqual to88% oxygen saturation was  minutes ().  During the treatment portion of the study, the polysomnogram revealed a presence of 1 obstructive, 1 central, and - mixed apneas resulting in an Apnea index of 0.5 events per hour.  There were 32 hypopneas (GreaterEqual to3% desaturation and/or arousal)  resulting in an Apnea\Hypopnea Index (AHI GreaterEqual to3% desaturation and/or arousal) of 9.0 events per hour.  There were 9 hypopneas (GreaterEqual to4% desaturation) resulting in an Apnea\Hypopnea Index (AHI GreaterEqual to4% desaturation) of 2.9  events per hour.  There were 3 Respiratory Effort Related Arousals resulting in a RERA index of 0.8 events per hour. The Respiratory Disturbance Index is 9.8 events per hour.  The snore index was - events per hour.  Mean oxygen saturation was 95.0%.  The  lowest oxygen saturation during sleep was 87.0%.  Time spent LessEqual to88% oxygen saturation was  minutes ().  Limb Activity During the diagnostic portion of the study, there were 70 limb movements recorded.  Of this total, 61 were classified as PLMs.  Of the PLMs, 27 were associated with arousals.  The Limb Movement index was 33.9 per hour while the PLM index was 29.5 per  hour.  During the treatment portion of the study,  there were - limb movements recorded.  Of this total, - were classified as PLMs.  Of the PLMs, - were associated with arousals.  The Limb Movement index was - per hour while the PLM index was - per hour.  Cardiac Summary During the diagnostic portion of the study, the average pulse rate was 84.9 bpm.  The minimum pulse rate was 63.0 bpm while the maximum pulse rate was 113.0 bpm.  During the treatment portion of the study, the average pulse rate was 77.4 bpm.  The minimum pulse rate was 65.0 bpm while the maximum pulse rate was 94.0 bpm.  Comments : Patient had a split-night study performed, tolerated CPAP titration well Was titrated to maximum pressure of 17 but notably had very good sleep at a pressure of 15 with adequate control of events  Diagnosis: Severe obstructive sleep apnea with AHI of 35.8, severe oxygen desaturations with nadir of 47%, saturations below 88% for 35.5 minutes Transition to CPAP therapy, titrated to CPAP of 15 with resolution of events and maintenance of his saturations.  Fair sleep efficiency, fair sleep architecture No significant periodic limb movement Cardiac rhythm was sinus  Recommendations: CPAP 15 with heated humidification with patient's mask of choice Patient used a large AirFit F20 fullface mask Avoid alcohol, sedatives and other CNS depressants that may worsen sleep apnea and disrupt normal sleep architecture. Sleep hygiene should be reviewed to assess factors that may improve sleep quality. Weight management and regular exercise should be initiated or continued   Follow-up 4 to 8 weeks following initiation of treatment to optimize therapy  This study was personally reviewed and electronically signed by: Eleonore Hammond, MD Accredited Board Certified in Sleep Medicine Date/Time:  04/01/24 "

## 2024-04-01 NOTE — Procedures (Signed)
 OVERLAPPING EVENTS DETECTED!  [facility] [address] [place], [place] [address] Tel: [phone]   Fax: [phone]  Split Night Interpretation  Patient Name:  Nathan Campbell, Nathan Campbell Date:  03/26/2024 Referring Physician:  KENNIETH LEECH [phone]) %%startinterp%% Indications for Polysomnography The patient is a 49 year old Male who is 6' 1 and weighs 247.0 lbs.  His BMI equals 32.7.  A diagnostic polysomnogram was performed to evaluate for -.  After 124.0 minutes of sleep time the patient exhibited sufficient respiratory events qualifying him for a CPAP trial which was then initiated.    Medication was taken at 9pm.  -  Stool Softener  Isordil   Flexeril    Polysomnogram Data A full night polysomnogram was performed recording the standard physiologic parameters including EEG, EOG, EMG, EKG, nasal and oral airflow.  Respiratory parameters of chest and abdominal movements are recorded with Piezo-Crystal motion transducers.  Oxygen saturation was recorded by pulse oximetry.    Sleep Architecture The total recording time of the diagnostic portion of the study was 129.9 minutes.  The total sleep time was 124.0 minutes.  During the diagnostic portion of the study, the patient spent 0.4% of total sleep time in Stage N1, 62.9% in Stage N2, 9.7% in Stages N3, and 27.0% in REM.   Sleep latency was 2.4 minutes.  REM latency was 55.0 minutes.  Sleep Efficiency was 95.5%.  Wake after Sleep Onset time was 3.5 minutes.   At 11:49:24 PM the patient was placed on PAP treatment and was titrated at pressures ranging from 8* cm/H20 with supplemental oxygen at - up to 17* cm/H20 with supplemental oxygen at -.  The total recording time of the treatment portion of the study was 295.4 minutes.  The total sleep time was 225.5 minutes.  During the treatment portion of the study, the patient spent 4.2% of total sleep time in Stage N1, 44.6% in Stage N2, 30.8% in Stages N3, and 20.4%  in REM.   Sleep latency was 0.5 minutes.  REM latency was 69.5 minutes.  Sleep Efficiency was 76.3%.  Wake after Sleep Onset time was 69.0 minutes.  Respiratory Events During the diagnostic portion of the study, the polysomnogram revealed a presence of 14 obstructive, 12 centrals, and - mixed apneas resulting in an Apnea index of 12.6 events per hour.  There were 48 hypopneas (>=3% desaturation and/or arousal) resulting in an Apnea\Hypopnea Index (AHI >=3% desaturation and/or arousal) of 35.8 events per hour.  There were 34 hypopneas (>=4% desaturation) resulting in an Apnea\Hypopnea Index (AHI >=4% desaturation) of 29.0 events per hour.  There were 4 Respiratory Effort Related Arousals resulting in a RERA index of 1.9 events per hour. The Respiratory Disturbance Index is 37.7 events per hour.  The snore index was - events per hour.  Mean oxygen saturation was 88.3%.  The lowest oxygen saturation during sleep was 47.0%.  Time spent <=88% oxygen saturation was 35.5 minutes (27.4%).  During the treatment portion of the study, the polysomnogram revealed a presence of 1 obstructive, 1 central, and - mixed apneas resulting in an Apnea index of 0.5 events per hour.  There were 32 hypopneas (>=3% desaturation and/or arousal) resulting in an Apnea\Hypopnea Index (AHI >=3% desaturation and/or arousal) of 9.0 events per hour.  There were 9 hypopneas (>=4% desaturation) resulting in an Apnea\Hypopnea Index (AHI >=4% desaturation) of 2.9 events per hour.  There were 3 Respiratory Effort Related Arousals resulting in a RERA index of 0.8 events per hour. The Respiratory  Disturbance Index is 9.8 events per hour.  The snore index was - events per hour.  Mean oxygen saturation was 95.0%.  The lowest oxygen saturation during sleep was 87.0%.  Time spent <=88% oxygen saturation was 0.1 minutes (-).  Limb Activity During the diagnostic portion of the study, there were 70 limb movements recorded.  Of this total, 61 were  classified as PLMs.  Of the PLMs, 27 were associated with arousals.  The Limb Movement index was 33.9 per hour while the PLM index was 29.5 per hour.  During the treatment portion of the study, there were - limb movements recorded.  Of this total, - were classified as PLMs.  Of the PLMs, - were associated with arousals.  The Limb Movement index was - per hour while the PLM index was - per hour.  Cardiac Summary During the diagnostic portion of the study, the average pulse rate was 84.9 bpm.  The minimum pulse rate was 63.0 bpm while the maximum pulse rate was 113.0 bpm.  During the treatment portion of the study, the average pulse rate was 77.4 bpm.  The minimum pulse rate was 65.0 bpm while the maximum pulse rate was 94.0 bpm.   Comments : Patient had a split-night study performed, tolerated CPAP titration well Was titrated to maximum pressure of 17 but notably had very good sleep at a pressure of 15 with adequate control of events  Diagnosis:  Severe obstructive sleep apnea with AHI of 35.8, severe oxygen desaturations with nadir of 47%, saturations below 88% for 35.5 minutes Transition to CPAP therapy, titrated to CPAP of 15 with resolution of events and maintenance of his saturations.  Fair sleep efficiency, fair sleep architecture No significant periodic limb movement Cardiac rhythm was sinus  Recommendations: CPAP 15 with heated humidification with patient's mask of choice Patient used a large AirFit F20 fullface mask Avoid alcohol, sedatives and other CNS depressants that may worsen sleep apnea and disrupt normal sleep architecture. Sleep hygiene should be reviewed to assess factors that may improve sleep quality. Weight management and regular exercise should be initiated or continued   Follow-up 4 to 8 weeks following initiation of treatment to optimize therapy  This study was personally reviewed and electronically signed by: Eleonore Hammond, MD Accredited Board Certified in  Sleep Medicine Date/Time:   04/01/24  %%endinterp%%  Split Night Report  Patient Name: BODI, PALMERI Date: 03/26/2024  Date of Birth: [birth year] Study Type: Split Night  Age: 46 year MRN #: [id number]  Sex: Male Interpreting Physician: NEDA HAMMOND, [id number]  Height: 6' 1 Referring Physician: KENNIETH LEECH [phone])  Weight: 247.0 lbs Recording Tech: Hargis Abu RPSGT RST  BMI: 32.7 Scoring Tech: Hargis Abu RPSGT RST  ESS: 21 Neck Size: 16  Mask Type AirFit F20 FFM Final Pressure: 17  Mask Size: Large Supplemental O2: -   Study Overview  DIAGNOSTIC TREATMENT  Lights Off: 09:39:05 PM Lights Off: 11:48:58 PM  Lights On: 11:48:58 PM Lights On: 04:44:24 AM  Time in Bed: 129.9 min. Time in Bed: 295.4 min.  Total Sleep Time: 124.0 min. Total Sleep Time: 225.5 min.  Sleep Efficiency: 95.5% Sleep Efficiency: 76.3%  Sleep Latency: 2.4 min. Sleep Latency: 0.5 min.  REM Latency from Sleep Onset: 55.0 min. REM Latency from Sleep Onset: 69.5 min.  Wake After Sleep Onset: 3.5 min. Wake After Sleep Onset: 69.0 min.   DIAGNOSTIC TREATMENT   Count Index  Count Index  Awakenings: 1 0.5 Awakenings: 16 4.3  Arousals: 90 43.5  Arousals: 56 14.9  AHI (>=3% Desat and/or Ar.): 74 35.8 AHI (>=3% Desat and/or Ar.): 34 9.0  AHI (>=4% Desat): 60 29.0 AHI (>=4% Desat): 11 2.9   Limb Movements: 70 33.9 Limb Movements: - -  Snore: - - Snore: - -  Desaturations: 69 33.4 Desaturations: 34 9.0  Minimum SpO2 TST: 47.0% Minimum SpO2 TST: 87.0%    Sleep Architecture   DIAGNOSTIC TREATMENT ENTIRE NIGHT  Stages Time (mins) % Sleep Time Time (mins) % Sleep Time Time (mins) % Sleep Time  Wake 6.0  70.0  76.0   Stage N1 0.5 0.4% 9.5 4.2% 10.0 2.9%  Stage N2 78.0 62.9% 100.5 44.6% 178.5 51.1%  Stage N3 12.0 9.7% 69.5 30.8% 81.5 23.3%  REM 33.5 27.0% 46.0 20.4% 79.5 22.7%   Arousal Summary   DIAGNOSTIC TREATMENT   NREM REM TST Index NREM REM TST Index  Respiratory Ar. 35 23 58 28.1 12 - 12 3.2  PLM  Ar. 27 - 27 13.1 - - - -  Isolated Limb Movement Ar. 4 - 4 1.9 - - - -  Snore Ar. - - - - - - - -  Spontaneous Ar. 5 2 7  3.4 41 3 44 11.7  Total Ar. 65 25 90 43.5 53 3 56 14.9    Respiratory Summary  DIAGNOSTIC By Sleep Stage By Body Position Total   NREM REM Supine Non-Supine   Time (min) 90.5 33.5 124.0 - 124.0         Obstructive Apnea 12 2 14  - 14  Mixed Apnea - - - - -  Central Apnea 12 - 12 - 12  Total Apneas 24 2 26  - 26  Total Apnea Index 15.9 3.6 12.6 - 12.6         Hypopneas (>=3% Desat and/or Ar.) 22 26 48 - 48  AHI (>=3% Desat and/or Ar.) 30.5 50.1 35.8 - 35.8         Hypopneas (>=4% Desat) 14 20 34 - 34  AHI (>=4% Desat) 25.2 39.4 29.0 - 29.0          RERAs 3 1 4  - 4  RERA Index 2.0 1.8 1.9 - 1.9         RDI 32.5 51.9 37.7 - 37.7    Respiratory Event Type Index  Central Apneas 5.8  Obstructive Apneas 6.8  Mixed Apneas -  Central Hypopneas -  Obstructive Hypopneas -  Central Apnea + Hypopnea (CAHI) 5.8  Obstructive Apnea + Hypopnea (OAHI) 32.9    TREATMENT By Sleep Stage By Body Position Total   NREM REM Supine Non-Supine   Time (min) 179.5 46.0 225.5 - 225.5         Obstructive Apnea 1 - 1 - 1  Mixed Apnea - - - - -  Central Apnea 1 - 1 - 1  Total Apneas 2 - 2 - 2  Total Apnea Index 0.7 - 0.5 - 0.5         Hypopneas (>=3% Desat and/or Ar.) 25 7 32 - 32  AHI (>=3% Desat and/or Ar.) 9.0 9.1 9.0 - 9.0         Hypopneas (>=4% Desat) 7 2 9  - 9  AHI (>=4% Desat) 3.0 2.6 2.9 - 2.9          RERAs 3 - 3 - 3  RERA Index 1.0 - 0.8 - 0.8         RDI 10.0 9.1 9.8 - 9.8    Respiratory Event Type  Index  Central Apneas 0.3  Obstructive Apneas 0.3  Mixed Apneas -  Central Hypopneas -  Obstructive Hypopneas -  Central Apnea + Hypopnea (CAHI) 0.3  Obstructive Apnea + Hypopnea (OAHI) 9.6    Respiratory Event Durations   DIAGNOSTIC TREATMENT  Apnea NREM REM NREM REM  Average (seconds) 24.0 17.5 18.2 -  Maximum (seconds) 34.1 20.9 18.3 -  Hypopnea       Average (seconds) 35.3 35.2 34.9 32.4  Maximum (seconds) 62.6 73.4 60.9 44.7    Limb Movement Summary   DIAGNOSTIC TREATMENT   Count Index Count Index  Isolated Limb Movements 9 4.4 - -  Periodic Limb Movements (PLMs) 61 29.5 - -  Total Limb Movements 70 33.9 - -    Oxygen Saturation Summary   DIAGNOSTIC TREATMENT   Wake NREM REM TST Wake NREM REM TST  Average SpO2 92.1% 90.4% 81.9% 88.1% 95.1% 94.9% 95.5% 95.0%  Minimum SpO2 80.0% 78.0% 47.0% 47.0%  90.0% 87.0% 91.0% 87.0%   Maximum SpO2 96.0% 98.0% 96.0% 98.0%  99.0% 99.0% 98.0% 99.0%    DIAGNOSTIC Oxygen Saturation Distribution  Range (%) Time in range (min) Time in range (%)   90.0 - 100.0 50.3 38.8%  80.0 - 90.0 65.3 50.3%  70.0 - 80.0 10.3 8.0%  60.0 - 70.0 2.7 2.1%  50.0 - 60.0 0.6 0.4%  0.0 - 50.0 0.1 0.1%  Time Spent <=88% SpO2  Range (%) Time in range (min) Time in range (%)  0.0 - 88.0 35.5 27.4%      Count Index  Desaturations: 69 33.4   TREATMENT Oxygen Saturation Distribution  Range (%) Time in range (min) Time in range (%)   90.0 - 100.0 269.4 97.9%  80.0 - 90.0 1.1 0.4%  70.0 - 80.0 - -  60.0 - 70.0 - -  50.0 - 60.0 - -  0.0 - 50.0 - -  Time Spent <=88% SpO2  Range (%) Time in range (min) Time in range (%)  0.0 - 88.0 0.1 0.0%      Count Index  Desaturations: 34 9.0    Cardiac Summary   DIAGNOSTIC TREATMENT   Wake NREM REM Total Wake NREM REM Total  Average Pulse Rate (BPM) 82.2 82.8 91.0 84.9 81.2 75.8 79.2 77.4  Minimum Pulse Rate (BPM) 71.0 63.0 80.0 63.0 66.0 65.0 70.0 65.0  Maximum Pulse Rate (BPM) 93.0 101.0 113.0 113.0 94.0 89.0 90.0 94.0   Pulse Rate Distribution   DIAGNOSTIC  Range (bpm) Time in range (min) Time in range (%)  0.0 - 40.0 - -  40.0 - 60.0 - -  60.0 - 80.0 22.1 17.0%  80.0 - 100.0 105.9 81.7%  100.0 - 120.0 1.7 1.3%  120.0 - 140.0 - -  140.0 - 200.0 - -   TREATMENT  Range (bpm) Time in range (min) Time in range (%)  0.0 - 40.0 - -  40.0 -  60.0 - -  60.0 - 80.0 221.4 80.4%  80.0 - 100.0 50.8 18.4%  100.0 - 120.0 - -  120.0 - 140.0 - -  140.0 - 200.0 - -    Titration Summary  PAP Device PAP Level O2 Level Time (min) Wake (min) NREM (min) REM (min) Supine TST (min) Sleep Eff% OA# CA# MA# Hyp# (>=3%) AHI (>=3%) Hyp# (>=4%) AHI (>=%4) RERA RDI SpO2 <=88% (min) Min SpO2 Mean SpO2 Ar. Index  - Off - 130.0 6.0 90.5 33.5  124.0 95.4% 14 12 - 48 35.8 34  29.0  4  37.7  35.2 47.0 88.1 43.5  CPAP 8 - 8.0 1.5 6.5 0.0  6.5 81.3% 1 1 - 4 55.4 2  36.9 2  73.8  0.0 90.0 92.9 46.2  CPAP 10 - 7.0 0.0 7.0 0.0  7.0 100.0% - - - 1 8.6 -  - -  8.6  0.0 90.0 92.7 8.6  CPAP 12 - 20.5 0.0 20.5 0.0  20.5 100.0% - - - 5 14.6 1  2.9 1  17.6  0.0 92.0 94.0 20.5  CPAP 14 - 51.5 0.5 34.0 17.0  51.0 99.0% - - - 13 15.3 4  4.7 -  15.3  0.1 87.0 95.4 17.6  CPAP 15 - 119.0 1.5 88.5 29.0  117.5 98.7% - - - 4 2.0 -  - -  2.0  0.0 93.0 95.2 6.1  CPAP 16 - 76.0 64.0 12.0 0.0  12.0 15.8% - - - 5 25.0 2  10.0 -  25.0  0.0 91.0 95.0 55.0  CPAP 17 - 13.5 2.5 11.0 0.0  11.0 81.5% - - - - - -  - -  -  0.0 94.0 95.7 27.3    Hypnograms                           Technologist Comments  Patient was at Sleep Lab for OSA. A Split Night Study was ordered. Patient met split night protocol. Patient was fitted with a large AirFit F20 FFM. CPAP pressure started at [facility] and titrated to CPAP pressure of 17CMH2O with heated humidity and heated tubing. Patient tolerated CPAP pressure well. Respiratory events eliminated. Snoring was eliminated. Periodic Limb Movement was noted. Patient took his medication at 9 pm. No oxygen was applied.

## 2024-04-02 ENCOUNTER — Ambulatory Visit

## 2024-04-02 DIAGNOSIS — R41841 Cognitive communication deficit: Secondary | ICD-10-CM

## 2024-04-02 DIAGNOSIS — R29898 Other symptoms and signs involving the musculoskeletal system: Secondary | ICD-10-CM

## 2024-04-02 DIAGNOSIS — I69854 Hemiplegia and hemiparesis following other cerebrovascular disease affecting left non-dominant side: Secondary | ICD-10-CM

## 2024-04-02 DIAGNOSIS — R29818 Other symptoms and signs involving the nervous system: Secondary | ICD-10-CM

## 2024-04-02 DIAGNOSIS — R471 Dysarthria and anarthria: Secondary | ICD-10-CM

## 2024-04-02 DIAGNOSIS — R278 Other lack of coordination: Secondary | ICD-10-CM

## 2024-04-02 NOTE — Patient Instructions (Signed)
  SLOW LOUD OVER-ENNUNCIATE PAUSE  PA TA KA  [place]  BUTTERCUP  CATERPILLAR  BASEBALLL PLAYER  [place]  [place]  SLOW AND BIG - EXAGGERATE YOUR MOUTH, MAKE EACH CONSONANT

## 2024-04-02 NOTE — Therapy (Signed)
 " OUTPATIENT OCCUPATIONAL THERAPY NEURO TREATMENT   Patient Name: Nathan Campbell MRN: [id number] DOB:[birth year], 49 y.o., male Today's Date: 04/02/2024  PCP: Benjamine Aland, MD REFERRING PROVIDER: Sebastian Jerilyn HERO, FNP  END OF SESSION:  OT End of Session - 04/02/24 0851     Visit Number 16    Number of Visits 22    Date for Recertification  05/11/24    Authorization Type [facility]    Authorization Time Period 02/27/24-05/07/24    Authorization - Visit Number 13    OT Start Time 0850    OT Stop Time 0932    OT Time Calculation (min) 42 min            Past Medical History:  Diagnosis Date   Acute combined systolic and diastolic CHF, NYHA class 4 (HCC) 06/10/2016   Anemia    Cardiomyopathy (HCC) 06/14/2016   CHF (congestive heart failure) (HCC)    COVID 10/2020   Dyspnea    ESRD on hemodialysis (HCC)    TTS at [facility]   Hypertension    Hypertensive heart and kidney disease with heart failure (HCC) 06/14/2016   Hypertensive heart disease with congestive heart failure (HCC)    Obesity    Past Surgical History:  Procedure Laterality Date   A/V FISTULAGRAM Left 05/03/2022   Procedure: A/V Fistulagram;  Surgeon: Eliza Lonni RAMAN, MD;  Location: [facility] INVASIVE CV LAB;  Service: Cardiovascular;  Laterality: Left;   AV FISTULA PLACEMENT Left 11/11/2020   Procedure: LEFT ARM First Stage Basilic Vein Fistula Creation;  Surgeon: Sheree Penne Lonni, MD;  Location: [facility] OR;  Service: Vascular;  Laterality: Left;   BASCILIC VEIN TRANSPOSITION Left 01/23/2021   Procedure: Second Stage Basilic Vein Transposition of Left Arm Arteriovenous  Fistula;  Surgeon: Sheree Penne Lonni, MD;  Location: [facility] OR;  Service: Vascular;  Laterality: Left;  Block  to LMA    COLONOSCOPY     FISTULA SUPERFICIALIZATION Left 05/07/2022   Procedure: PLICATION OF ANEURYSM, LEFT ARM FISTULA;  Surgeon: Eliza Lonni RAMAN, MD;  Location: [facility] OR;  Service: Vascular;  Laterality: Left;   IR CT  HEAD LTD  10/07/2023   IR CT HEAD LTD  10/07/2023   IR PATIENT EVAL TECH 0-60 MINS  10/07/2023   IR PERCUTANEOUS ART THROMBECTOMY/INFUSION INTRACRANIAL INC DIAG ANGIO  10/07/2023   NO PAST SURGERIES     PERIPHERAL VASCULAR BALLOON ANGIOPLASTY Left 05/03/2022   Procedure: PERIPHERAL VASCULAR BALLOON ANGIOPLASTY;  Surgeon: Eliza Lonni RAMAN, MD;  Location: [facility] INVASIVE CV LAB;  Service: Cardiovascular;  Laterality: Left;  AVF   RADIOLOGY WITH ANESTHESIA N/A 10/07/2023   Procedure: RADIOLOGY WITH ANESTHESIA;  Surgeon: Radiologist, Medication, MD;  Location: [facility] OR;  Service: Radiology;  Laterality: N/A;   Patient Active Problem List   Diagnosis Date Noted   Stroke (HCC) 11/04/2023   Cognitive and neurobehavioral dysfunction 10/24/2023   Acute hypoxic respiratory failure (HCC) 10/17/2023   Essential hypertension 10/17/2023   Dysphagia 10/17/2023   Leukocytosis 10/17/2023   Obesity 10/17/2023   Acute ischemic stroke (HCC) 10/07/2023   Middle cerebral artery embolism, right 10/07/2023   ESRD (end stage renal disease) (HCC) 03/26/2022   Hyperlipidemia, unspecified 03/20/2021   Acute gout due to renal impairment involving left wrist 04/24/2019   Malignant hypertension 10/14/2016   Combined congestive systolic and diastolic heart failure (HCC) 06/14/2016   NICM (nonischemic cardiomyopathy) (HCC) 06/14/2016   Renal failure    Hypertensive urgency 06/10/2016   CKD (chronic kidney disease), stage IV (  HCC) 06/10/2016   Normocytic anemia 06/10/2016    ONSET DATE: 12/19/2023 referral date Admit date: 10/18/2023 Discharge date: 11/04/2023  REFERRING DIAG: G81.90 (ICD-10-CM) - Hemiplegia, unspecified affecting unspecified side  THERAPY DIAG:  Hemiplegia and hemiparesis following other cerebrovascular disease affecting left non-dominant side (HCC)  Other symptoms and signs involving the nervous system  Other lack of coordination  Other symptoms and signs involving the musculoskeletal  system  Rationale for Evaluation and Treatment: Rehabilitation  SUBJECTIVE:   SUBJECTIVE STATEMENT: Pt reports he is tired from not sleeping well last night.  Pt accompanied by: self  PERTINENT HISTORY: Pt is a 49 yo male presenting to this clinic with hemiplegia s/p R MCA embolism. Pt was hospitalized for this from 10/07/23-10/18/23, then discharged to neurology and received IP PT/OT/SLP 10/18/23-11/04/23. Initial CT head revealed right MCA territory ischemic changes with calcific density in proximal right M1 concerning for calcific embolus.   CHF, CM, ESRD on HD TTS, HTN, obesity, HLD  PRECAUTIONS: Fall, L sided weakness, blood thinners, no electrical modalities as of 04/02/24  WEIGHT BEARING RESTRICTIONS: No  PAIN:  Are you having pain? No  FALLS: Has patient fallen in last 6 months? No  LIVING ENVIRONMENT: Lives with: lives with their family sister and her 2 sons. Pt reports will be getting own place soon. Lives in: House/apartment townhouse, 2 level Stairs: Yes: Internal: 3 steps; on right going up Has following equipment at home: None  PLOF: Independent and Vocation/Vocational requirements: Worked in distribution in [employer]   PATIENT GOALS: Get back movement in my left arm, return to work, and be able to play video games like I was before.  OBJECTIVE:  Note: Objective measures were completed at Evaluation unless otherwise noted.  HAND DOMINANCE: Right  ADLs: Overall ADLs: Independent Transfers/ambulation related to ADLs: Independent Eating: Independent Grooming: Independent UB Dressing: Independent LB Dressing: Independent Toileting: Independent Bathing: Independent Tub Shower transfers: Independent, shower/tub combo Equipment: none  IADLs: Shopping: Independent Light housekeeping: Mom is doing pharmacologist.  Meal Prep: Needs some help with opening cans and jars d/t weakness in L hand Community mobility: Mother driving pt to appts  Medication management: Mother  helping with medication mgmt Financial management: Independent Handwriting: did not assess d/t pt report of no changes and pt dominant hand unaffected.   MOBILITY STATUS: Independent  POSTURE COMMENTS:  No Significant postural limitations Sitting balance: WFL  ACTIVITY TOLERANCE: Activity tolerance: No changes  FUNCTIONAL OUTCOME MEASURES: Quick Dash: 47.7/100: 47.7% impairment 01/30/24: 52.3% impairment 02/2024 20.5  02/17/24:  UPPER EXTREMITY ROM:  R WNL  Active ROM Right eval Left eval  Shoulder flexion  92  Shoulder abduction  70  Wrist flexion  10  Wrist extension  40  Wrist pronation    Wrist supination  Impaired (re-assess)  (Blank rows = not tested)  UPPER EXTREMITY MMT:   WFL RUE, 3-/5 grossly for LUE  HAND FUNCTION: Grip strength: Right: 61.2 lbs; Left: 15.8 lbs 01/20/24: Left: 19.4 lbs    COORDINATION: 9 Hole Peg test: Right: 41.60 sec; Left: unable to pick up a peg in 2 minutes sec Box and Blocks:  Right 36 blocks, Left 11blocks 01/20/24: Left: 15 blocks 03/19/24: 19 blocks L hand, 38 blocks R hand   SENSATION: WFL  EDEMA: none  MUSCLE TONE: LUE: Rigidity  COGNITION: Overall cognitive status: Within functional limits for tasks assessed  VISION: Subjective report: no concerns Baseline vision: No visual deficits Visual history: no hx  VISION ASSESSMENT: WFL To be further assessed  in functional context  Patient has difficulty with following activities due to following visual impairments: none  PERCEPTION: WFL  PRAXIS: Impaired: Ideomotor  OBSERVATIONS: Weakness in L hand and limited ROM in LUE affecting ability to complete ADL/IADL.                                                                                                                            TREATMENT:   - Therapeutic exercises completed for duration as noted below including:   Pt placed BUE in Fluidotherapy machine with supervised ROM x 9 min. Pt was educated to complete  B hand AROM and tendon glides during modality time to improve ROM and decrease pain/stiffness of affected extremity by use of the machine's massaging action and thermal properties. Pt reported it was getting to be too hot, therefore removed at approximately 7 minutes. Skin integrity intact pre and post modality.  Completed 10 gross grasps with Digiflex, first attempted with 3# resistance, then graded down to 1.5# resistance. Pt completed 10 repetitions.  Retrieved clinic red theraputty and completed review of putty exercises. Min cues required for sequencing/proper form.Completed with L hand 10 reps gross grasp, pinch and pull, rolling, tip to tip pinch, 3 point pinch, key pinch, and removing 5 items from putty.  - Therapeutic activities completed for duration as noted below including:  Pt completed tip to tip pinch with L hand, and shoulder horizontal abduction moving rings across therapy arch. Mod cues required to pinch rather than grasp to allow for easier release. Pt was observed dropping rings with pinching.     PATIENT EDUCATION: Education details:  Person educated: Patient Education method: Programmer, Multimedia, Facilities Manager, Actor cues, Verbal cues, and Handouts Education comprehension: verbalized understanding, returned demonstration, verbal cues required, and needs further education  HOME EXERCISE PROGRAM: 12/30/23: Shoulder, Forearm/wrist, and hand HEP  01/02/24: Theraputty HEP with red putty (ACCESS CODE [email or web address]) 01/11/24: Shoulder shrug exercises (same access code as above) 01/16/24: E-stim and saebo glove [employer] 01/20/24: Nonie opener [employer] 02/17/24: Coordination activities (see Pt instructions) 02/27/24: Leonel exercises (same acces code)    GOALS: Goals reviewed with patient? Yes   NEW SHORT TERM GOALS: Target date: 04/12/24  1. Pt will be able to place at least 20 blocks using left hand with completion of Box and Blocks test.   Baseline at initial  eval: 15 blocks 01/30/24: 8 blocks 02/17/24: 14 blocks 03/19/24: 19 blocks  NEW LONG TERM GOALS: Target date: 05/11/24 1. Patient will demonstrate at least 40 lbs L grip strength as needed to open jars and other containers.   Baseline at initial eval: 19.2  01/30/24: 18.27 lbs  02/17/24: 25.5 lbs average   03/19/14: 21.7 lbs average  ASSESSMENT:  CLINICAL IMPRESSION: Patient is a 49 y.o. male who was seen today for occupational therapy tx for L sided weakness s/p stroke. Pt having some difficulty still with pinching, mod cues required. Pt would benefit from continued skilled services to continue working  on FM coordination and grip strength of L hand to promote return to PLOF and work as able.  PERFORMANCE DEFICITS: in functional skills including ADLs, IADLs, coordination, dexterity, tone, ROM, strength, pain, Fine motor control, body mechanics, cardiopulmonary status limiting function, and UE functional use, cognitive skills including attention, safety awareness, and sequencing, and psychosocial skills including coping strategies, interpersonal interactions, and routines and behaviors.   IMPAIRMENTS: are limiting patient from IADLs, rest and sleep, work, and leisure.   CO-MORBIDITIES: has co-morbidities such as ESRD that affects occupational performance. Patient will benefit from skilled OT to address above impairments and improve overall function.  REHAB POTENTIAL: Good   PLAN:  OT FREQUENCY: 1x/week  OT DURATION: additional 10 weeks  PLANNED INTERVENTIONS: 97168 OT Re-evaluation, 97535 self care/ADL training, 02889 therapeutic exercise, 97530 therapeutic activity, 97112 neuromuscular re-education, 97140 manual therapy, 97039 fluidotherapy, 97032 electrical stimulation (manual), 97760 Orthotic Initial, [id number] Orthotic/Prosthetic subsequent, passive range of motion, functional mobility training, visual/perceptual remediation/compensation, energy conservation, patient/family education, and DME  and/or AE instructions  RECOMMENDED OTHER SERVICES: none noted  CONSULTED AND AGREED WITH PLAN OF CARE: Patient  PLAN FOR NEXT SESSION:  Coordination/ strengthening activities ROM activities WB activities   [place], OT 04/02/2024, 9:36 AM                  "

## 2024-04-02 NOTE — Therapy (Signed)
 " OUTPATIENT SPEECH LANGUAGE PATHOLOGY TREATMENT   Patient Name: Nathan Campbell MRN: [id number] DOB:Jan 12, 1976, 49 y.o., male Today's Date: 04/02/2024  PCP: Benjamine Aland, MD REFERRING PROVIDER: Sebastian Jerilyn HERO, FNP  END OF SESSION:  End of Session - 04/02/24 1144     Visit Number 2    Number of Visits 17    Date for Recertification  05/14/24    SLP Start Time 0933    SLP Stop Time  1015    SLP Time Calculation (min) 42 min    Activity Tolerance Patient tolerated treatment well           Past Medical History:  Diagnosis Date   Acute combined systolic and diastolic CHF, NYHA class 4 (HCC) 06/10/2016   Anemia    Cardiomyopathy (HCC) 06/14/2016   CHF (congestive heart failure) (HCC)    COVID 10/2020   Dyspnea    ESRD on hemodialysis (HCC)    TTS at [facility]   Hypertension    Hypertensive heart and kidney disease with heart failure (HCC) 06/14/2016   Hypertensive heart disease with congestive heart failure (HCC)    Obesity    Past Surgical History:  Procedure Laterality Date   A/V FISTULAGRAM Left 05/03/2022   Procedure: A/V Fistulagram;  Surgeon: Eliza Lonni RAMAN, MD;  Location: [facility] INVASIVE CV LAB;  Service: Cardiovascular;  Laterality: Left;   AV FISTULA PLACEMENT Left 11/11/2020   Procedure: LEFT ARM First Stage Basilic Vein Fistula Creation;  Surgeon: Sheree Penne Lonni, MD;  Location: [facility] OR;  Service: Vascular;  Laterality: Left;   BASCILIC VEIN TRANSPOSITION Left 01/23/2021   Procedure: Second Stage Basilic Vein Transposition of Left Arm Arteriovenous  Fistula;  Surgeon: Sheree Penne Lonni, MD;  Location: [facility] OR;  Service: Vascular;  Laterality: Left;  Block  to LMA    COLONOSCOPY     FISTULA SUPERFICIALIZATION Left 05/07/2022   Procedure: PLICATION OF ANEURYSM, LEFT ARM FISTULA;  Surgeon: Eliza Lonni RAMAN, MD;  Location: [facility] OR;  Service: Vascular;  Laterality: Left;   IR CT HEAD LTD  10/07/2023   IR CT HEAD LTD  10/07/2023   IR  PATIENT EVAL TECH 0-60 MINS  10/07/2023   IR PERCUTANEOUS ART THROMBECTOMY/INFUSION INTRACRANIAL INC DIAG ANGIO  10/07/2023   NO PAST SURGERIES     PERIPHERAL VASCULAR BALLOON ANGIOPLASTY Left 05/03/2022   Procedure: PERIPHERAL VASCULAR BALLOON ANGIOPLASTY;  Surgeon: Eliza Lonni RAMAN, MD;  Location: [facility] INVASIVE CV LAB;  Service: Cardiovascular;  Laterality: Left;  AVF   RADIOLOGY WITH ANESTHESIA N/A 10/07/2023   Procedure: RADIOLOGY WITH ANESTHESIA;  Surgeon: Radiologist, Medication, MD;  Location: [facility] OR;  Service: Radiology;  Laterality: N/A;   Patient Active Problem List   Diagnosis Date Noted   Stroke (HCC) 11/04/2023   Cognitive and neurobehavioral dysfunction 10/24/2023   Acute hypoxic respiratory failure (HCC) 10/17/2023   Essential hypertension 10/17/2023   Dysphagia 10/17/2023   Leukocytosis 10/17/2023   Obesity 10/17/2023   Acute ischemic stroke (HCC) 10/07/2023   Middle cerebral artery embolism, right 10/07/2023   ESRD (end stage renal disease) (HCC) 03/26/2022   Hyperlipidemia, unspecified 03/20/2021   Acute gout due to renal impairment involving left wrist 04/24/2019   Malignant hypertension 10/14/2016   Combined congestive systolic and diastolic heart failure (HCC) 06/14/2016   NICM (nonischemic cardiomyopathy) (HCC) 06/14/2016   Renal failure    Hypertensive urgency 06/10/2016   CKD (chronic kidney disease), stage IV (HCC) 06/10/2016   Normocytic anemia 06/10/2016    ONSET DATE: 01/17/2024  REFERRING DIAG: R13.10 (ICD-10-CM) - Dysphagia, unspecified  THERAPY DIAG:  Dysarthria and anarthria  Cognitive communication deficit  Rationale for Evaluation and Treatment: Rehabilitation  SUBJECTIVE:   SUBJECTIVE STATEMENT: I think my speech is terrible. Pt accompanied by: self  PERTINENT HISTORY: Pt is a 49 yo male presenting to this clinic with hemiplegia s/p R MCA embolism. Pt was hospitalized for this from 10/07/23-10/18/23, then discharged to neurology and  received IP PT/OT/SLP 10/18/23-11/04/23. Initial CT head revealed right MCA territory ischemic changes with calcific density in proximal right M1 concerning for calcific embolus.  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 Pt likes to play video games and watch sports at home. Pt notes that he feels that his speech is terrible while family or other people around him state that his speech is clearer.   PAIN:  Are you having pain? No  FALLS: Has patient fallen in last 6 months?  See PT evaluation for details  LIVING ENVIRONMENT: Lives with: lives alone Lives in: House/apartment  PLOF:  Level of assistance: Independent with ADLs Employment: Full-time employment  PATIENT GOALS: To get back to work.  OBJECTIVE:  Note: Objective measures were completed at Evaluation unless otherwise noted.  DIAGNOSTIC FINDINGS: MRI BRAIN WITHOUT CONTRAST 10/08/2023 12:38:36 PM   TECHNIQUE: Multiplanar multisequence MRI of the head/brain was performed without the administration of intravenous contrast.   COMPARISON: CT head without contrast 10/07/2023.   CLINICAL HISTORY: Stroke, follow up.   FINDINGS:   BRAIN AND VENTRICLES: A large right MCA (middle cerebral artery) territory infarct is present. The right caudate head and lentiform nucleus are infarcted. Insular ribbon and ganglionic and supraganglionic cortical infarcts are noted. A focal area of cortical infarction is present in the medial anterior right frontal lobe. No focal hemorrhage is present. Diffuse T2 and FLAIR hyperintensities are present throughout the infarcted area. Additional periventricular T2 hyperintensities are moderately advanced for age. Diffuse cortical thickening is present with some mass effect but no significant midline shift.   ORBITS: No acute abnormality.   SINUSES AND MASTOIDS: Fluid levels are present in the posterior ethmoid air cells, the right maxillary sinus and bilateral  sphenoid sinuses. Fluid is present in the nasopharynx.   BONES AND SOFT TISSUES: Normal marrow signal. No acute soft tissue abnormality.   IMPRESSION: 1. Large right MCA territory infarct with associated diffuse cortical thickening and some mass effect, but no significant midline shift. No focal hemorrhage. 2. Moderately advanced periventricular T2 hyperintensities for age. This likely reflects sequela of chronic microvascular ischemia. 3. Diffuse sinus disease likely related to intubated status.  COGNITION: Overall cognitive status: Impaired: Areas of impairment:  Attention: Impaired: Sustained, Selective Memory: Impaired: Working It Consultant function: Impaired: Impulse control, Problem solving, Organization, Planning, and Error awareness Behavior: Impulsive  Areas of impairment:  Look above at overall cognitive status. Functional deficits: Pt forgets where he puts objects in his apartment.   AUDITORY COMPREHENSION: Overall auditory comprehension: Appears intact   EXPRESSION: verbal    MOTOR SPEECH: Overall motor speech: impaired Level of impairment: Sentence and Conversation Respiration: thoracic breathing Phonation: normal and low vocal intensity Resonance: WFL Articulation: Impaired: sentence and conversation Intelligibility: Intelligibility reduced Motor planning: Appears intact Motor speech errors: aware and consistent Interfering components: N/A Effective technique: increased vocal intensity  ORAL MOTOR EXAMINATION: Overall status: WFL Comments: N/A   STANDARDIZED ASSESSMENTS: CLQT: Clock Drawing: Severe Other portions of CLQT are in progress.   PATIENT REPORTED OUTCOME MEASURES (PROM): To be  completed in upcoming session.                                                                                                                            TREATMENT DATE:  04/02/24: Cognition eval: Pt tiredness is improved from previous session. SLP  completed CLQT with pt. Plan is to initiate cognitive-communication support strategies and for pt to complete cognition PROM.   Dysarthria tx: SLP initiated tx for dysarthria by reviewing SLOP (Slow, Loud, Over-articulation, Pacing) with pt for optimizing speech intelligibility. SLP required pt to read words and functional phrases with initial /s, sh/ sounds. Pt required frequent min A for SLOP during word and phrase readings to achieve speech intelligibility between 90-95%. Pt highly benefits from pacing as a strategy for improving his speech intelligibility. SLP provided pt HEP focused on word/function phrases productions for multiple speech sounds for continued practice. Plan is to continue dysarthria tx focused on phrase/sentence length productions to maximize pt's communicative effectiveness.   03/19/24: Evaluation in progress. CLQT was started but will need to be finished in upcoming session. Motor speech evaluation completed. Pt demonstrated increased tiredness during session, which pt attributed to less sleep from previous night. No tx completed on this date.   PATIENT EDUCATION: Education details: Evaluation Results, using a dump basket for items to decrease forgetfulness of essential items.  Person educated: Patient Education method: Explanation Education comprehension: verbalized understanding and needs further education   GOALS: Goals reviewed with patient? Yes  SHORT TERM GOALS: Target date: 04/19/24  Pt will achieve 99-100% speech intelligibility during a ~8 minute conversation given occasional min A for dysarthria strategies (increased loudness, over-articulation, etc.).  Baseline: Goal status: INITIAL  2.  Pt will demonstrate use of memory strategies for optimizing short-term memory recall independently across 2 sessions given occasional min A. Baseline:  Goal status: INITIAL  3.  Pt will complete PROM for speech. Baseline:  Goal status: INITIAL  4.  Pt will  complete CLQT.  Baseline:  Goal status: INITIAL  5.  Pt will display adequate selective attention to perform functional, work related tasks using cognitive-communication strategies with 90% consistency given occasional min A. Baseline:  Goal status: INITIAL  6.  TBD Baseline:  Goal status: INITIAL  LONG TERM GOALS: Target date: 05/14/24  Pt will achieve 99-100% speech intelligibility during a >15 minute conversation independently.  Baseline:  Goal status: INITIAL  2.  Pt will demonstrate use of memory strategies for optimizing short-term memory recall independently across 4 sessions. Baseline:  Goal status: INITIAL  3.  Pt will demonstrate adequate cognitive-communication skills required for returning to work.  Baseline:  Goal status: INITIAL  4.  Pt will independently carry-over HEP across 6 sessions for optimizing generalization of dysarthria and cognitive-communication strategies. Baseline:  Goal status: INITIAL  5.  TBD Baseline:  Goal status: INITIAL  6.  TBD Baseline:  Goal status: INITIAL  ASSESSMENT:  CLINICAL IMPRESSION: Patient is a 49 y.o. male who was  seen today for cognitive and motor speech evaluation s/p R MCA embolism. Pt demonstrated increased tiredness during evaluation tasks, which could have impacted results. Pt presents with mild-moderate dysarthria characterized by consistent articulatory breakdown at the sentence level and decreased vocal intensity, especially in longer utterances. Pt also presents with mild-moderate cognitive-communication difficulties in the areas of short-term memory, selective/sustained attention, and executive function. Pt notes that he thinks that his speech is terrible and it impacts his conversations with family and friends. 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 Pt notes that he has difficulties with his short-term memory, especially for important objects at home. During current  cognitive assessment tasks, pt demonstrated increased impulsivity, which decreased selective attention to tasks. ST is recommended due to impacts of dysarthria and cognitive-communication deficits on pt's ADLs and communication. Without ST, pt is at risk of not safely returning to his previous job due to cognitive-communication deficits and increased social isolation due to current dysarthria, which highly impacts pt's QOL.   OBJECTIVE IMPAIRMENTS: include attention, memory, executive functioning, and dysarthria. These impairments are limiting patient from return to work, ADLs/IADLs, and effectively communicating at home and in community. Factors affecting potential to achieve goals and functional outcome are N/A. Patient will benefit from skilled SLP services to address above impairments and improve overall function.  REHAB POTENTIAL: Good  PLAN:  SLP FREQUENCY: 2x/week  SLP DURATION: 8 weeks  PLANNED INTERVENTIONS: Environmental controls, Cognitive reorganization, Internal/external aids, Functional tasks, SLP instruction and feedback, Compensatory strategies, Patient/family education, and [id number] Treatment of speech (30 or 45 min)     Waddell Music, CF-SLP 04/02/2024, 11:45 AM      "

## 2024-04-04 ENCOUNTER — Telehealth: Payer: Self-pay | Admitting: Neurology

## 2024-04-04 NOTE — Telephone Encounter (Signed)
 Patient's mother, Yardley Lekas (on [place]) checking seeing if have received form faxed from [place]  [employer] for long term disability.

## 2024-04-05 NOTE — Telephone Encounter (Signed)
 Spoke to patients mother and Dr. Benjamine will complete the disability paperwork. If they need any medical information from the patients records they will let us  know.

## 2024-04-09 ENCOUNTER — Ambulatory Visit: Admitting: Occupational Therapy

## 2024-04-09 ENCOUNTER — Ambulatory Visit

## 2024-04-11 ENCOUNTER — Ambulatory Visit: Admitting: Occupational Therapy

## 2024-04-11 ENCOUNTER — Ambulatory Visit

## 2024-04-11 DIAGNOSIS — R29818 Other symptoms and signs involving the nervous system: Secondary | ICD-10-CM

## 2024-04-11 DIAGNOSIS — R278 Other lack of coordination: Secondary | ICD-10-CM

## 2024-04-11 DIAGNOSIS — R208 Other disturbances of skin sensation: Secondary | ICD-10-CM

## 2024-04-11 DIAGNOSIS — R29898 Other symptoms and signs involving the musculoskeletal system: Secondary | ICD-10-CM

## 2024-04-11 DIAGNOSIS — R41841 Cognitive communication deficit: Secondary | ICD-10-CM

## 2024-04-11 DIAGNOSIS — R471 Dysarthria and anarthria: Secondary | ICD-10-CM

## 2024-04-11 DIAGNOSIS — M6281 Muscle weakness (generalized): Secondary | ICD-10-CM

## 2024-04-11 DIAGNOSIS — R41840 Attention and concentration deficit: Secondary | ICD-10-CM

## 2024-04-11 DIAGNOSIS — I69854 Hemiplegia and hemiparesis following other cerebrovascular disease affecting left non-dominant side: Secondary | ICD-10-CM

## 2024-04-11 DIAGNOSIS — I63411 Cerebral infarction due to embolism of right middle cerebral artery: Secondary | ICD-10-CM

## 2024-04-11 NOTE — Therapy (Signed)
 " OUTPATIENT SPEECH LANGUAGE PATHOLOGY TREATMENT   Patient Name: Nathan Campbell MRN: [id number] DOB:10/07/1975, 49 y.o., male Today's Date: 04/11/2024  PCP: Benjamine Aland, MD REFERRING PROVIDER: Sebastian Jerilyn HERO, FNP  END OF SESSION:  End of Session - 04/11/24 1014     Visit Number 3    Number of Visits 17    Date for Recertification  05/14/24    SLP Start Time 0935    SLP Stop Time  1014    SLP Time Calculation (min) 39 min    Activity Tolerance Patient tolerated treatment well            Past Medical History:  Diagnosis Date   Acute combined systolic and diastolic CHF, NYHA class 4 (HCC) 06/10/2016   Anemia    Cardiomyopathy (HCC) 06/14/2016   CHF (congestive heart failure) (HCC)    COVID 10/2020   Dyspnea    ESRD on hemodialysis (HCC)    TTS at [facility]   Hypertension    Hypertensive heart and kidney disease with heart failure (HCC) 06/14/2016   Hypertensive heart disease with congestive heart failure (HCC)    Obesity    Past Surgical History:  Procedure Laterality Date   A/V FISTULAGRAM Left 05/03/2022   Procedure: A/V Fistulagram;  Surgeon: Eliza Lonni RAMAN, MD;  Location[facility] INVASIVE CV LAB;  Service: Cardiovascular;  Laterality: Left;   AV FISTULA PLACEMENT Left 11/11/2020   Procedure: LEFT ARM First Stage Basilic Vein Fistula Creation;  Surgeon: Sheree Penne Lonni, MD;  Location: [facility] OR;  Service: Vascular;  Laterality: Left;   BASCILIC VEIN TRANSPOSITION Left 01/23/2021   Procedure: Second Stage Basilic Vein Transposition of Left Arm Arteriovenous  Fistula;  Surgeon: Sheree Penne Lonni, MD;  Location: [facility] OR;  Service: Vascular;  Laterality: Left;  Block  to LMA    COLONOSCOPY     FISTULA SUPERFICIALIZATION Left 05/07/2022   Procedure: PLICATION OF ANEURYSM, LEFT ARM FISTULA;  Surgeon: Eliza Lonni RAMAN, MD;  Location: [facility] OR;  Service: Vascular;  Laterality: Left;   IR CT HEAD LTD  10/07/2023   IR CT HEAD LTD  10/07/2023   IR  PATIENT EVAL TECH 0-60 MINS  10/07/2023   IR PERCUTANEOUS ART THROMBECTOMY/INFUSION INTRACRANIAL INC DIAG ANGIO  10/07/2023   NO PAST SURGERIES     PERIPHERAL VASCULAR BALLOON ANGIOPLASTY Left 05/03/2022   Procedure: PERIPHERAL VASCULAR BALLOON ANGIOPLASTY;  Surgeon: Eliza Lonni RAMAN, MD;  Location: [facility] INVASIVE CV LAB;  Service: Cardiovascular;  Laterality: Left;  AVF   RADIOLOGY WITH ANESTHESIA N/A 10/07/2023   Procedure: RADIOLOGY WITH ANESTHESIA;  Surgeon: Radiologist, Medication, MD;  Location: [facility] OR;  Service: Radiology;  Laterality: N/A;   Patient Active Problem List   Diagnosis Date Noted   Stroke (HCC) 11/04/2023   Cognitive and neurobehavioral dysfunction 10/24/2023   Acute hypoxic respiratory failure (HCC) 10/17/2023   Essential hypertension 10/17/2023   Dysphagia 10/17/2023   Leukocytosis 10/17/2023   Obesity 10/17/2023   Acute ischemic stroke (HCC) 10/07/2023   Middle cerebral artery embolism, right 10/07/2023   ESRD (end stage renal disease) (HCC) 03/26/2022   Hyperlipidemia, unspecified 03/20/2021   Acute gout due to renal impairment involving left wrist 04/24/2019   Malignant hypertension 10/14/2016   Combined congestive systolic and diastolic heart failure (HCC) 06/14/2016   NICM (nonischemic cardiomyopathy) (HCC) 06/14/2016   Renal failure    Hypertensive urgency 06/10/2016   CKD (chronic kidney disease), stage IV (HCC) 06/10/2016   Normocytic anemia 06/10/2016    ONSET DATE: 01/17/2024  REFERRING DIAG: R13.10 (ICD-10-CM) - Dysphagia, unspecified  THERAPY DIAG:  Dysarthria and anarthria  Cognitive communication deficit  Rationale for Evaluation and Treatment: Rehabilitation  SUBJECTIVE:   SUBJECTIVE STATEMENT: I think my speech is terrible. Pt accompanied by: self  PERTINENT HISTORY: Pt is a 49 yo male presenting to this clinic with hemiplegia s/p R MCA embolism. Pt was hospitalized for this from 10/07/23-10/18/23, then discharged to neurology and  received IP PT/OT/SLP 10/18/23-11/04/23. Initial CT head revealed right MCA territory ischemic changes with calcific density in proximal right M1 concerning for calcific embolus.  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 Pt likes to play video games and watch sports at home. Pt notes that he feels that his speech is terrible while family or other people around him state that his speech is clearer.   PAIN:  Are you having pain? No  FALLS: Has patient fallen in last 6 months?  See PT evaluation for details  LIVING ENVIRONMENT: Lives with: lives alone Lives in: House/apartment  PLOF:  Level of assistance: Independent with ADLs Employment: Full-time employment  PATIENT GOALS: To get back to work.  OBJECTIVE:  Note: Objective measures were completed at Evaluation unless otherwise noted.  DIAGNOSTIC FINDINGS: MRI BRAIN WITHOUT CONTRAST 10/08/2023 12:38:36 PM   TECHNIQUE: Multiplanar multisequence MRI of the head/brain was performed without the administration of intravenous contrast.   COMPARISON: CT head without contrast 10/07/2023.   CLINICAL HISTORY: Stroke, follow up.   FINDINGS:   BRAIN AND VENTRICLES: A large right MCA (middle cerebral artery) territory infarct is present. The right caudate head and lentiform nucleus are infarcted. Insular ribbon and ganglionic and supraganglionic cortical infarcts are noted. A focal area of cortical infarction is present in the medial anterior right frontal lobe. No focal hemorrhage is present. Diffuse T2 and FLAIR hyperintensities are present throughout the infarcted area. Additional periventricular T2 hyperintensities are moderately advanced for age. Diffuse cortical thickening is present with some mass effect but no significant midline shift.   ORBITS: No acute abnormality.   SINUSES AND MASTOIDS: Fluid levels are present in the posterior ethmoid air cells, the right maxillary sinus and bilateral  sphenoid sinuses. Fluid is present in the nasopharynx.   BONES AND SOFT TISSUES: Normal marrow signal. No acute soft tissue abnormality.   IMPRESSION: 1. Large right MCA territory infarct with associated diffuse cortical thickening and some mass effect, but no significant midline shift. No focal hemorrhage. 2. Moderately advanced periventricular T2 hyperintensities for age. This likely reflects sequela of chronic microvascular ischemia. 3. Diffuse sinus disease likely related to intubated status.  COGNITION: Overall cognitive status: Impaired: Areas of impairment:  Attention: Impaired: Sustained, Selective Memory: Impaired: Working It Consultant function: Impaired: Impulse control, Problem solving, Organization, Planning, and Error awareness Behavior: Impulsive  Areas of impairment:  Look above at overall cognitive status. Functional deficits: Pt forgets where he puts objects in his apartment.   AUDITORY COMPREHENSION: Overall auditory comprehension: Appears intact   EXPRESSION: verbal    MOTOR SPEECH: Overall motor speech: impaired Level of impairment: Sentence and Conversation Respiration: thoracic breathing Phonation: normal and low vocal intensity Resonance: WFL Articulation: Impaired: sentence and conversation Intelligibility: Intelligibility reduced Motor planning: Appears intact Motor speech errors: aware and consistent Interfering components: N/A Effective technique: increased vocal intensity  ORAL MOTOR EXAMINATION: Overall status: WFL Comments: N/A   STANDARDIZED ASSESSMENTS: CLQT: Clock Drawing: Severe Other portions of CLQT are in progress.   PATIENT REPORTED OUTCOME MEASURES (PROM): To be  completed in upcoming session.                                                                                                                            TREATMENT DATE:  04/11/24: Pt notes that he tries to practice his speech exercises in the morning  each day. SLP reviewed dysarthria strategies (SLOP) with pt to optimize pt's speech intelligibility. SLP challenged pt to read 5-7 word sentences using (SLOP) as a strategy for maximizing speech intelligibly. Pt utilized dysarthria strategies with 80% consistency during 5-7 word sentence readings given frequent min A for over-articulation during multi-syllabic words. SLP singled out multisyllabic words for pt to read to maximize over-articulation as a strategy for enhancing speech production. SLP initiated cognitive-communication strategies for optimizing pt's short-term memory recall. Pt notes that he often forgets where he puts small items (keys) when he places them down in his place. SLP instructed pt in obtaining a catch-all basket for collecting small items in one location for future retrieval. Pt is agreeable to trying a catch-all basket at home. Pt also notes that he sometimes feels nervous about his medication management, when he has short-term memory lapses about if he took a specific medication. SLP introduced a visual medication record form to write down information about his medications and to keep track of each time he takes his medication to improve short-term memory recall for medication management. Pt is making progress with dysarthria and cognitive-communication goals. Plan is to continue sentence-level speech production using dysarthria strategies and building cognitive-communication support systems for optimizing safety and independence at home.   04/02/24: Cognition eval: Pt tiredness is improved from previous session. SLP completed CLQT with pt. Plan is to initiate cognitive-communication support strategies and for pt to complete cognition PROM.   Dysarthria tx: SLP initiated tx for dysarthria by reviewing SLOP (Slow, Loud, Over-articulation, Pacing) with pt for optimizing speech intelligibility. SLP required pt to read words and functional phrases with initial /s, sh/ sounds. Pt  required frequent min A for SLOP during word and phrase readings to achieve speech intelligibility between 90-95%. Pt highly benefits from pacing as a strategy for improving his speech intelligibility. SLP provided pt HEP focused on word/function phrases productions for multiple speech sounds for continued practice. Plan is to continue dysarthria tx focused on phrase/sentence length productions to maximize pt's communicative effectiveness.   03/19/24: Evaluation in progress. CLQT was started but will need to be finished in upcoming session. Motor speech evaluation completed. Pt demonstrated increased tiredness during session, which pt attributed to less sleep from previous night. No tx completed on this date.   PATIENT EDUCATION: Education details: Evaluation Results, using a dump basket for items to decrease forgetfulness of essential items.  Person educated: Patient Education method: Explanation Education comprehension: verbalized understanding and needs further education   GOALS: Goals reviewed with patient? Yes  SHORT TERM GOALS: Target date: 04/19/24  Pt will achieve 99-100% speech intelligibility during a ~8 minute conversation given  occasional min A for dysarthria strategies (increased loudness, over-articulation, etc.).  Baseline: Goal status: ONGOING  2.  Pt will demonstrate use of memory strategies for optimizing short-term memory recall independently across 2 sessions given occasional min A. Baseline:  Goal status: ONGOING  3.  Pt will complete PROM for speech. Baseline:  Goal status: ONGOING  4.  Pt will complete CLQT.  Baseline:  Goal status: INITIAL  5.  Pt will display adequate selective attention to perform functional, work related tasks using cognitive-communication strategies with 90% consistency given occasional min A. Baseline:  Goal status: ONGOING  6.  TBD Baseline:  Goal status: INITIAL  LONG TERM GOALS: Target date: 05/14/24  Pt will achieve  99-100% speech intelligibility during a >15 minute conversation independently.  Baseline:  Goal status: ONGOING  2.  Pt will demonstrate use of memory strategies for optimizing short-term memory recall independently across 4 sessions. Baseline:  Goal status: ONGOING  3.  Pt will demonstrate adequate cognitive-communication skills required for returning to work.  Baseline:  Goal status: ONGOING  4.  Pt will independently carry-over HEP across 6 sessions for optimizing generalization of dysarthria and cognitive-communication strategies. Baseline:  Goal status: ONGOING  5.  TBD Baseline:  Goal status: INITIAL  6.  TBD Baseline:  Goal status: INITIAL  ASSESSMENT:  CLINICAL IMPRESSION: Patient is a 49 y.o. male who was seen today for cognitive and motor speech evaluation s/p R MCA embolism. Pt demonstrated increased tiredness during evaluation tasks, which could have impacted results. Pt presents with mild-moderate dysarthria characterized by consistent articulatory breakdown at the sentence level and decreased vocal intensity, especially in longer utterances. Pt also presents with mild-moderate cognitive-communication difficulties in the areas of short-term memory, selective/sustained attention, and executive function. Pt notes that he thinks that his speech is terrible and it impacts his conversations with family and friends. 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 Pt notes that he has difficulties with his short-term memory, especially for important objects at home. During current cognitive assessment tasks, pt demonstrated increased impulsivity, which decreased selective attention to tasks. ST is recommended due to impacts of dysarthria and cognitive-communication deficits on pt's ADLs and communication. Without ST, pt is at risk of not safely returning to his previous job due to cognitive-communication deficits and increased social isolation due  to current dysarthria, which highly impacts pt's QOL.   OBJECTIVE IMPAIRMENTS: include attention, memory, executive functioning, and dysarthria. These impairments are limiting patient from return to work, ADLs/IADLs, and effectively communicating at home and in community. Factors affecting potential to achieve goals and functional outcome are N/A. Patient will benefit from skilled SLP services to address above impairments and improve overall function.  REHAB POTENTIAL: Good  PLAN:  SLP FREQUENCY: 2x/week  SLP DURATION: 8 weeks  PLANNED INTERVENTIONS: Environmental controls, Cognitive reorganization, Internal/external aids, Functional tasks, SLP instruction and feedback, Compensatory strategies, Patient/family education, and [id number] Treatment of speech (30 or 45 min)     Waddell Music, M.A., CF-SLP 04/11/2024, 10:19 AM      "

## 2024-04-11 NOTE — Patient Instructions (Addendum)
 Short-term memory strategies:  Catch-all or dump basket: This is container, box, or basket that you place in a designated area.  You will put your small items (wallet, keys, phone) in this same location every time you need to go to another room and leave those objects.  Medication Record sheet Use this sheet to write down your medications along with all the information. Use this sheet to track your medications you take each day to help with avoiding repeats.

## 2024-04-11 NOTE — Therapy (Signed)
 " OUTPATIENT OCCUPATIONAL THERAPY NEURO TREATMENT   Patient Name: Nathan Campbell MRN: [id number] DOB:September 16, 1975, 49 y.o., male Today's Date: 04/11/2024  PCP: Benjamine Aland, MD REFERRING PROVIDER: Sebastian Jerilyn HERO, FNP  END OF SESSION:  OT End of Session - 04/11/24 0848     Visit Number 17    Number of Visits 22    Date for Recertification  05/11/24    Authorization Type [employer] [facility]    Authorization Time Period 02/27/24-05/07/24    OT Start Time 0852    OT Stop Time 0930    OT Time Calculation (min) 38 min    Activity Tolerance Patient tolerated treatment well;Patient limited by lethargy    Behavior During Therapy [facility] for tasks assessed/performed          Past Medical History:  Diagnosis Date   Acute combined systolic and diastolic CHF, NYHA class 4 (HCC) 06/10/2016   Anemia    Cardiomyopathy (HCC) 06/14/2016   CHF (congestive heart failure) (HCC)    COVID 10/2020   Dyspnea    ESRD on hemodialysis (HCC)    TTS at [facility]   Hypertension    Hypertensive heart and kidney disease with heart failure (HCC) 06/14/2016   Hypertensive heart disease with congestive heart failure (HCC)    Obesity    Past Surgical History:  Procedure Laterality Date   A/V FISTULAGRAM Left 05/03/2022   Procedure: A/V Fistulagram;  Surgeon: Eliza Lonni RAMAN, MD;  Location: [facility] INVASIVE CV LAB;  Service: Cardiovascular;  Laterality: Left;   AV FISTULA PLACEMENT Left 11/11/2020   Procedure: LEFT ARM First Stage Basilic Vein Fistula Creation;  Surgeon: Sheree Penne Lonni, MD;  Location: [facility] OR;  Service: Vascular;  Laterality: Left;   BASCILIC VEIN TRANSPOSITION Left 01/23/2021   Procedure: Second Stage Basilic Vein Transposition of Left Arm Arteriovenous  Fistula;  Surgeon: Sheree Penne Lonni, MD;  Location: [facility] OR;  Service: Vascular;  Laterality: Left;  Block  to LMA    COLONOSCOPY     FISTULA SUPERFICIALIZATION Left 05/07/2022   Procedure: PLICATION OF ANEURYSM, LEFT ARM FISTULA;   Surgeon: Eliza Lonni RAMAN, MD;  Location: [facility] OR;  Service: Vascular;  Laterality: Left;   IR CT HEAD LTD  10/07/2023   IR CT HEAD LTD  10/07/2023   IR PATIENT EVAL TECH 0-60 MINS  10/07/2023   IR PERCUTANEOUS ART THROMBECTOMY/INFUSION INTRACRANIAL INC DIAG ANGIO  10/07/2023   NO PAST SURGERIES     PERIPHERAL VASCULAR BALLOON ANGIOPLASTY Left 05/03/2022   Procedure: PERIPHERAL VASCULAR BALLOON ANGIOPLASTY;  Surgeon: Eliza Lonni RAMAN, MD;  Location: [facility] INVASIVE CV LAB;  Service: Cardiovascular;  Laterality: Left;  AVF   RADIOLOGY WITH ANESTHESIA N/A 10/07/2023   Procedure: RADIOLOGY WITH ANESTHESIA;  Surgeon: Radiologist, Medication, MD;  Location: [facility] OR;  Service: Radiology;  Laterality: N/A;   Patient Active Problem List   Diagnosis Date Noted   Stroke (HCC) 11/04/2023   Cognitive and neurobehavioral dysfunction 10/24/2023   Acute hypoxic respiratory failure (HCC) 10/17/2023   Essential hypertension 10/17/2023   Dysphagia 10/17/2023   Leukocytosis 10/17/2023   Obesity 10/17/2023   Acute ischemic stroke (HCC) 10/07/2023   Middle cerebral artery embolism, right 10/07/2023   ESRD (end stage renal disease) (HCC) 03/26/2022   Hyperlipidemia, unspecified 03/20/2021   Acute gout due to renal impairment involving left wrist 04/24/2019   Malignant hypertension 10/14/2016   Combined congestive systolic and diastolic heart failure (HCC) 06/14/2016   NICM (nonischemic cardiomyopathy) (HCC) 06/14/2016   Renal failure  Hypertensive urgency 06/10/2016   CKD (chronic kidney disease), stage IV (HCC) 06/10/2016   Normocytic anemia 06/10/2016    ONSET DATE: 12/19/2023 referral date Admit date: 10/18/2023 Discharge date: 11/04/2023  REFERRING DIAG: G81.90 (ICD-10-CM) - Hemiplegia, unspecified affecting unspecified side  THERAPY DIAG:  Hemiplegia and hemiparesis following other cerebrovascular disease affecting left non-dominant side (HCC)  Other symptoms and signs involving the nervous  system  Other symptoms and signs involving the musculoskeletal system  Other lack of coordination  Muscle weakness (generalized)  Other disturbances of skin sensation  Attention and concentration deficit  Cerebrovascular accident (CVA) due to embolism of right middle cerebral artery (HCC)  Rationale for Evaluation and Treatment: Rehabilitation  SUBJECTIVE:   SUBJECTIVE STATEMENT: Pt reports he still has difficulty picking up items with Left hand. He knows he needs to use this hand more frequently, but this is discouraged by his hypersensitivity to temperature and touch.  Pt accompanied by: self  PERTINENT HISTORY: Pt is a 49 yo male presenting to this clinic with hemiplegia s/p R MCA embolism. Pt was hospitalized for this from 10/07/23-10/18/23, then discharged to neurology and received IP PT/OT/SLP 10/18/23-11/04/23. Initial CT head revealed right MCA territory ischemic changes with calcific density in proximal right M1 concerning for calcific embolus.   CHF, CM, ESRD on HD TTS, HTN, obesity, HLD  PRECAUTIONS: Fall, L sided weakness, blood thinners, no electrical modalities as of 04/02/24  WEIGHT BEARING RESTRICTIONS: No  PAIN:  Are you having pain? No  FALLS: Has patient fallen in last 6 months? No  LIVING ENVIRONMENT: Lives with: lives with their family sister and her 2 sons. Pt reports will be getting own place soon. Lives in: House/apartment townhouse, 2 level Stairs: Yes: Internal: 3 steps; on right going up Has following equipment at home: None  PLOF: Independent and Vocation/Vocational requirements: Worked in distribution in [employer]   PATIENT GOALS: Get back movement in my left arm, return to work, and be able to play video games like I was before.  OBJECTIVE:  Note: Objective measures were completed at Evaluation unless otherwise noted.  HAND DOMINANCE: Right  ADLs: Overall ADLs: Independent Transfers/ambulation related to ADLs: Independent Eating:  Independent Grooming: Independent UB Dressing: Independent LB Dressing: Independent Toileting: Independent Bathing: Independent Tub Shower transfers: Independent, shower/tub combo Equipment: none  IADLs: Shopping: Independent Light housekeeping: Mom is doing pharmacologist.  Meal Prep: Needs some help with opening cans and jars d/t weakness in L hand Community mobility: Mother driving pt to appts  Medication management: Mother helping with medication mgmt Financial management: Independent Handwriting: did not assess d/t pt report of no changes and pt dominant hand unaffected.   MOBILITY STATUS: Independent  POSTURE COMMENTS:  No Significant postural limitations Sitting balance: WFL  ACTIVITY TOLERANCE: Activity tolerance: No changes  FUNCTIONAL OUTCOME MEASURES: Quick Dash: 47.7/100: 47.7% impairment 01/30/24: 52.3% impairment 02/2024 20.5  02/17/24:  UPPER EXTREMITY ROM:  R WNL  Active ROM Right eval Left eval  Shoulder flexion  92  Shoulder abduction  70  Wrist flexion  10  Wrist extension  40  Wrist pronation    Wrist supination  Impaired (re-assess)  (Blank rows = not tested)  UPPER EXTREMITY MMT:   WFL RUE, 3-/5 grossly for LUE  HAND FUNCTION: Grip strength: Right: 61.2 lbs; Left: 15.8 lbs 01/20/24: Left: 19.4 lbs  COORDINATION: 9 Hole Peg test: Right: 41.60 sec; Left: unable to pick up a peg in 2 minutes sec Box and Blocks:  Right 36 blocks, Left 11blocks  01/20/24: Left: 15 blocks 03/19/24: 19 blocks L hand, 38 blocks R hand  SENSATION: WFL  EDEMA: none  MUSCLE TONE: LUE: Rigidity  COGNITION: Overall cognitive status: Within functional limits for tasks assessed  VISION: Subjective report: no concerns Baseline vision: No visual deficits Visual history: no hx  VISION ASSESSMENT: WFL To be further assessed in functional context  Patient has difficulty with following activities due to following visual impairments: none  PERCEPTION: WFL  PRAXIS:  Impaired: Ideomotor  OBSERVATIONS: Weakness in L hand and limited ROM in LUE affecting ability to complete ADL/IADL.                                                                                                                            TREATMENT:   - Self-care/home management completed for duration as noted below including: OT educated patient on use of paraffin bath.  Patient was informed of risks and benefits to treatment today and in agreement with trial.  Pt tested temperature prior to dipping hand/s and no concerns noted.  Patient tolerated paraffin bath to B hands with a towel wrap for 10 minutes during education re: AE and jt protection principles.  Paraffin Bath was performed in preparation for ROM, strength and functional activities AND as trial for home modality consideration.  No redness, irritation or skin integrity concerns were noted before, during and/or after use.   Skin integrity prior to treatment: Intact. Skin integrity after treatment: Intact.  Information provided about possible home home modality equipment options for paraffin.  Educated patient on desensitization program including:  Purpose of desensitization to reduce hypersensitivity How to perform light rubbing with the least irritating texture How to progress up the texture hierarchy Recommended frequency: 5-10 minutes each hour while awake Expected sensations (temporary numbness, decreasing discomfort) Importance of consistency Provided written handout and demonstrated technique. Patient returned demonstration with minimal cues.   PATIENT EDUCATION: Education details: paraffin; desensitization Person educated: Patient Education method: Explanation, Demonstration, Tactile cues, Verbal cues, and Handouts Education comprehension: verbalized understanding, returned demonstration, verbal cues required, and needs further education  HOME EXERCISE PROGRAM: 12/30/23: Shoulder, Forearm/wrist, and hand HEP   01/02/24: Theraputty HEP with red putty (ACCESS CODE [id number]) 01/11/24: Shoulder shrug exercises (same access code as above) 01/16/24: E-stim and saebo glove [employer] 01/20/24: Nonie opener [employer] 02/17/24: Coordination activities (see Pt instructions) 02/27/24: Leonel exercises (same acces code) 04/11/2024: desensitization  GOALS: Goals reviewed with patient? Yes   NEW SHORT TERM GOALS: Target date: 04/12/24  1. Pt will be able to place at least 20 blocks using left hand with completion of Box and Blocks test.   Baseline at initial eval: 15 blocks 01/30/24: 8 blocks 02/17/24: 14 blocks 03/19/24: 19 blocks  NEW LONG TERM GOALS: Target date: 05/11/24 1. Patient will demonstrate at least 40 lbs L grip strength as needed to open jars and other containers.   Baseline at initial eval: 19.2  01/30/24: 18.27 lbs  02/17/24: 25.5 lbs  average   03/19/14: 21.7 lbs average  ASSESSMENT:  CLINICAL IMPRESSION: Patient is a 49 y.o. male who was seen today for occupational therapy tx for L sided weakness s/p stroke. Pt would benefit from continued skilled services to continue working on FM coordination and grip strength of L hand to promote return to PLOF and work as able. Focus on desensitization efforts and volitional use of LUE with everyday tasks.  PERFORMANCE DEFICITS: in functional skills including ADLs, IADLs, coordination, dexterity, tone, ROM, strength, pain, Fine motor control, body mechanics, cardiopulmonary status limiting function, and UE functional use, cognitive skills including attention, safety awareness, and sequencing, and psychosocial skills including coping strategies, interpersonal interactions, and routines and behaviors.   IMPAIRMENTS: are limiting patient from IADLs, rest and sleep, work, and leisure.   CO-MORBIDITIES: has co-morbidities such as ESRD that affects occupational performance. Patient will benefit from skilled OT to address above impairments and  improve overall function.  REHAB POTENTIAL: Good   PLAN:  OT FREQUENCY: 1x/week  OT DURATION: additional 10 weeks  PLANNED INTERVENTIONS: 97168 OT Re-evaluation, 97535 self care/ADL training, 02889 therapeutic exercise, 97530 therapeutic activity, 97112 neuromuscular re-education, 97140 manual therapy, 97039 fluidotherapy, 97032 electrical stimulation (manual), 97760 Orthotic Initial, [id number] Orthotic/Prosthetic subsequent, passive range of motion, functional mobility training, visual/perceptual remediation/compensation, energy conservation, patient/family education, and DME and/or AE instructions  RECOMMENDED OTHER SERVICES: none noted  CONSULTED AND AGREED WITH PLAN OF CARE: Patient  PLAN FOR NEXT SESSION: promote volitional use with less guarding Coordination/ strengthening activities ROM activities WB activities   Jocelyn CHRISTELLA Bottom, OT 04/11/2024, 9:43 AM "

## 2024-04-11 NOTE — Patient Instructions (Signed)
Desensitization program  The most effective way to diminish hypersensitivity is by repeated touching of the sensitive area.  The following program will assist in the desensitization process and should be performed 5 to 10 minutes of each hour you are awake:  1.  Beginning with the texture which causes the least discomfort, rub the sensitive area lightly for 10 minutes or until area feels numb and no longer sensitive.  2.  In an hour, return to the same texture and rub as before.  However, if this texture seems to cause no abnormal feelings, it is time to progress to a rougher texture.  Do not return to the softer texture; continue to progress through the list until you complete it.  It is important to be very consistent with this treatment.  The closer the program is followed, the faster you will find relief of your symptoms.  1.  Fur/cotton balls 2.  Flannel cloth 3.  Cotton fabric 4.  Denim fabric 5.  Burlap 6.  Raw peas/beans 7.  Raw rice 8.  Uncooked macaroni 9.  Metal, i.e. paperclips 10.  Tapping on edge of table 11.  Vibration 

## 2024-04-16 ENCOUNTER — Ambulatory Visit

## 2024-04-16 ENCOUNTER — Ambulatory Visit: Admitting: Occupational Therapy

## 2024-04-23 ENCOUNTER — Ambulatory Visit

## 2024-04-25 ENCOUNTER — Ambulatory Visit

## 2024-04-30 ENCOUNTER — Ambulatory Visit

## 2024-05-02 ENCOUNTER — Ambulatory Visit

## 2024-05-07 ENCOUNTER — Ambulatory Visit

## 2024-05-09 ENCOUNTER — Ambulatory Visit

## 2024-05-14 ENCOUNTER — Ambulatory Visit

## 2024-05-16 ENCOUNTER — Ambulatory Visit

## 2024-05-21 ENCOUNTER — Ambulatory Visit

## 2024-05-23 ENCOUNTER — Ambulatory Visit

## 2024-05-28 ENCOUNTER — Ambulatory Visit

## 2024-05-30 ENCOUNTER — Ambulatory Visit

## 2024-06-04 ENCOUNTER — Ambulatory Visit

## 2024-06-06 ENCOUNTER — Ambulatory Visit
